# Patient Record
Sex: Female | Born: 1950
Health system: Southern US, Community
[De-identification: ages and names within clinical notes are randomized; demographics above are authoritative.]

## PROBLEM LIST (undated history)

## (undated) DIAGNOSIS — M1990 Unspecified osteoarthritis, unspecified site: Secondary | ICD-10-CM

## (undated) DIAGNOSIS — Z91199 Patient's noncompliance with other medical treatment and regimen due to unspecified reason: Secondary | ICD-10-CM

## (undated) DIAGNOSIS — I351 Nonrheumatic aortic (valve) insufficiency: Secondary | ICD-10-CM

## (undated) DIAGNOSIS — I4819 Other persistent atrial fibrillation: Secondary | ICD-10-CM

## (undated) DIAGNOSIS — I5022 Chronic systolic (congestive) heart failure: Secondary | ICD-10-CM

## (undated) DIAGNOSIS — T4145XA Adverse effect of unspecified anesthetic, initial encounter: Secondary | ICD-10-CM

## (undated) DIAGNOSIS — R569 Unspecified convulsions: Secondary | ICD-10-CM

## (undated) DIAGNOSIS — E119 Type 2 diabetes mellitus without complications: Secondary | ICD-10-CM

## (undated) DIAGNOSIS — I10 Essential (primary) hypertension: Secondary | ICD-10-CM

## (undated) DIAGNOSIS — Z9119 Patient's noncompliance with other medical treatment and regimen: Secondary | ICD-10-CM

## (undated) DIAGNOSIS — L719 Rosacea, unspecified: Secondary | ICD-10-CM

## (undated) DIAGNOSIS — T8859XA Other complications of anesthesia, initial encounter: Secondary | ICD-10-CM

## (undated) HISTORY — PX: FRACTURE SURGERY: SHX138

## (undated) HISTORY — PX: TONSILLECTOMY: SUR1361

---
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
## 1976-05-17 HISTORY — PX: WRIST HARDWARE REMOVAL: SHX2673

## 1976-05-17 HISTORY — PX: WRIST FRACTURE SURGERY: SHX121

## 2010-05-17 HISTORY — PX: PERICARDIOCENTESIS: SHX2215

## 2014-03-21 ENCOUNTER — Emergency Department (HOSPITAL_COMMUNITY): Payer: Medicaid - Out of State

## 2014-03-21 ENCOUNTER — Inpatient Hospital Stay (HOSPITAL_COMMUNITY)
Admission: EM | Admit: 2014-03-21 | Discharge: 2014-03-25 | DRG: 308 | Disposition: A | Discharge status: Home / Self Care | Payer: Medicaid - Out of State | Attending: Internal Medicine | Admitting: Internal Medicine

## 2014-03-21 ENCOUNTER — Encounter (HOSPITAL_COMMUNITY): Payer: Self-pay | Admitting: Emergency Medicine

## 2014-03-21 DIAGNOSIS — I509 Heart failure, unspecified: Secondary | ICD-10-CM

## 2014-03-21 DIAGNOSIS — R0602 Shortness of breath: Secondary | ICD-10-CM

## 2014-03-21 DIAGNOSIS — I480 Paroxysmal atrial fibrillation: Principal | ICD-10-CM | POA: Diagnosis present

## 2014-03-21 DIAGNOSIS — Z888 Allergy status to other drugs, medicaments and biological substances status: Secondary | ICD-10-CM | POA: Diagnosis not present

## 2014-03-21 DIAGNOSIS — Z833 Family history of diabetes mellitus: Secondary | ICD-10-CM | POA: Diagnosis not present

## 2014-03-21 DIAGNOSIS — R569 Unspecified convulsions: Secondary | ICD-10-CM | POA: Diagnosis present

## 2014-03-21 DIAGNOSIS — Z6839 Body mass index (BMI) 39.0-39.9, adult: Secondary | ICD-10-CM

## 2014-03-21 DIAGNOSIS — Z9119 Patient's noncompliance with other medical treatment and regimen: Secondary | ICD-10-CM | POA: Diagnosis present

## 2014-03-21 DIAGNOSIS — I471 Supraventricular tachycardia: Secondary | ICD-10-CM

## 2014-03-21 DIAGNOSIS — E669 Obesity, unspecified: Secondary | ICD-10-CM | POA: Diagnosis present

## 2014-03-21 DIAGNOSIS — Z880 Allergy status to penicillin: Secondary | ICD-10-CM | POA: Diagnosis not present

## 2014-03-21 DIAGNOSIS — I5023 Acute on chronic systolic (congestive) heart failure: Secondary | ICD-10-CM | POA: Diagnosis present

## 2014-03-21 DIAGNOSIS — I4892 Unspecified atrial flutter: Secondary | ICD-10-CM | POA: Diagnosis present

## 2014-03-21 DIAGNOSIS — R739 Hyperglycemia, unspecified: Secondary | ICD-10-CM

## 2014-03-21 DIAGNOSIS — E1165 Type 2 diabetes mellitus with hyperglycemia: Secondary | ICD-10-CM | POA: Diagnosis present

## 2014-03-21 DIAGNOSIS — Z91199 Patient's noncompliance with other medical treatment and regimen due to unspecified reason: Secondary | ICD-10-CM

## 2014-03-21 HISTORY — DX: Unspecified convulsions: R56.9

## 2014-03-21 LAB — BASIC METABOLIC PANEL
Anion gap: 14 (ref 5–15)
BUN: 15 mg/dL (ref 6–23)
CO2: 22 mEq/L (ref 19–32)
Calcium: 8.8 mg/dL (ref 8.4–10.5)
Chloride: 102 mEq/L (ref 96–112)
Creatinine, Ser: 1.01 mg/dL (ref 0.50–1.10)
GFR calc Af Amer: 67 mL/min — ABNORMAL LOW (ref >90)
GFR calc non Af Amer: 58 mL/min — ABNORMAL LOW (ref >90)
Glucose, Bld: 362 mg/dL — ABNORMAL HIGH (ref 70–99)
Potassium: 5 mEq/L (ref 3.7–5.3)
Sodium: 138 mEq/L (ref 137–147)

## 2014-03-21 LAB — CBC
HCT: 39.6 % (ref 36.0–46.0)
Hemoglobin: 12.9 g/dL (ref 12.0–15.0)
MCH: 33.4 pg (ref 26.0–34.0)
MCHC: 32.6 g/dL (ref 30.0–36.0)
MCV: 102.6 fL — ABNORMAL HIGH (ref 78.0–100.0)
Platelets: 231 10*3/uL (ref 150–400)
RBC: 3.86 MIL/uL — ABNORMAL LOW (ref 3.87–5.11)
RDW: 16.2 % — ABNORMAL HIGH (ref 11.5–15.5)
WBC: 11.7 10*3/uL — ABNORMAL HIGH (ref 4.0–10.5)

## 2014-03-21 LAB — PRO B NATRIURETIC PEPTIDE: Pro B Natriuretic peptide (BNP): 2946 pg/mL — ABNORMAL HIGH (ref 0–125)

## 2014-03-21 LAB — DIGOXIN LEVEL: Digoxin Level: <0.3 ng/mL — ABNORMAL LOW (ref 0.8–2.0)

## 2014-03-21 LAB — PROTIME-INR
INR: 1.12 (ref 0.00–1.49)
Prothrombin Time: 14.5 seconds (ref 11.6–15.2)

## 2014-03-21 LAB — I-STAT TROPONIN, ED: Troponin i, poc: 0 ng/mL (ref 0.00–0.08)

## 2014-03-21 MED ORDER — METOPROLOL TARTRATE 1 MG/ML IV SOLN
5.0000 mg | Freq: Once | INTRAVENOUS | Status: AC
  Administered 2014-03-21: 5 mg via INTRAVENOUS
  Filled 2014-03-21: qty 5

## 2014-03-21 MED ORDER — DILTIAZEM HCL 100 MG IV SOLR
5.0000 mg/h | Freq: Once | INTRAVENOUS | Status: AC
  Administered 2014-03-21: 5 mg/h via INTRAVENOUS
  Administered 2014-03-22: 15 mg/h via INTRAVENOUS

## 2014-03-21 MED ORDER — FUROSEMIDE 10 MG/ML IJ SOLN
40.0000 mg | INTRAMUSCULAR | Status: AC
  Administered 2014-03-21: 40 mg via INTRAVENOUS
  Filled 2014-03-21: qty 4

## 2014-03-21 MED ORDER — SODIUM CHLORIDE 0.9 % IV BOLUS (SEPSIS): 250.0000 mL | Freq: Once | INTRAVENOUS | Status: DC

## 2014-03-21 NOTE — ED Notes (Signed)
Cardiologist at [facility]

## 2014-03-21 NOTE — ED Notes (Signed)
Pt brought to ED by [facility] from home c/o chest pressure, SOB, weakness. Pt has Hx of Afib, CHF. Pt was very SOB and diaphoretic on [facility] arrival, HR irregular on the 180's, Cardizem 10 mg IV bolus given x 2, HR on 127's after Cardizem given by [facility]. Pt still SOB, NAD noticed.

## 2014-03-21 NOTE — ED Provider Notes (Signed)
  Face-to-face evaluation   History: she presents for evaluation of rapid heartbeat which started today.  She had associated shortness of breath and weakness but no chest pain.  Physical exam:calm, cooperative.  Heart rate is 119, at 2305.  No respiratory distress.  Medical screening examination/treatment/procedure(s) were conducted as a shared visit with non-physician practitioner(s) and myself.  I personally evaluated the patient during the encounter  Flint Melter, MD 03/22/14 2342

## 2014-03-21 NOTE — ED Notes (Signed)
Pt very upset about getting Lasix, she state she will refuse any other Lasix for tonight.

## 2014-03-21 NOTE — ED Provider Notes (Signed)
CSN: [id number]     Arrival date & time 03/21/14  2101 History   First MD Initiated Contact with Patient 03/21/14 2155     Chief Complaint  Patient presents with  . Shortness of Breath  . Weakness    (Consider location/radiation/quality/duration/timing/severity/associated sxs/prior Treatment) HPI Comments: 63 year old female with a history of CHF, A. Fib, diabetes mellitus, and seizures presents to the ED for further evaluation of SOB and weakness. Patient states that symptoms began this morning patient states that symptoms began this morning as severe shortness of breath which was worse with exertion. She states that she could barely walk to the bathroom as a result of symptoms. Patient endorses associated palpitations, stating "it felt like my heart was beating so fast". Patient states that she moved to the area one week ago. She has been out of all of her medications except Lasix 40 mg which she has been taking daily. Patient states that she usually also takes carvedilol, glyburide, digoxin, and enalapril. She states that she used to be on Prozac so, but has been off of blood thinners for one year. Patient denies associated chest pain, loss of consciousness, lightheadedness or dizziness, headache, nausea, vomiting, and leg swelling. She has not established care with any provider in the area. On EMS arrival, patient was found to have a heart rate which was irregular and in the 180s. Patient was given 10 mg IV Cardizem bolus 2 which improved heart rate to 127.  Patient is a 63 y.o. female presenting with shortness of breath and weakness. The history is provided by the patient. No language interpreter was used.  Shortness of Breath Weakness Associated symptoms include weakness.    Past Medical History  Diagnosis Date  . CHF (congestive heart failure)   . Atrial fibrillation   . Diabetes mellitus without complication     no insuline dependent  . Seizures    Past Surgical History  Procedure  Laterality Date  . Orthopedic surgery     History reviewed. No pertinent family history. History  Substance Use Topics  . Smoking status: Never Smoker   . Smokeless tobacco: Never Used  . Alcohol Use: No   OB History    No data available      Review of Systems  Respiratory: Positive for shortness of breath.   Cardiovascular: Positive for palpitations.  Neurological: Positive for weakness.  All other systems reviewed and are negative.   Allergies  Caffeine and Penicillins  Home Medications   Prior to Admission medications   Medication Sig Start Date End Date Taking? Authorizing Provider  furosemide (LASIX) 40 MG tablet Take 40 mg by mouth daily.   Yes Historical Provider, MD   BP 112/84 mmHg  Pulse 118  Temp(Src) 97.9 F (36.6 C) (Oral)  Resp 23  Ht 5\' 11"  (1.803 m)  Wt 285 lb (129.275 kg)  BMI 39.77 kg/m2  SpO2 93%   Physical Exam  Constitutional: She is oriented to person, place, and time. She appears well-developed and well-nourished. No distress.  HENT:  Head: Normocephalic and atraumatic.  Eyes: Conjunctivae and EOM are normal. No scleral icterus.  Neck: Normal range of motion.  Cardiovascular: Regular rhythm and normal pulses.  Tachycardia present.   Pulses:      Radial pulses are 2+ on the right side, and 2+ on the left side.  Pulmonary/Chest: Effort normal and breath sounds normal. No respiratory distress. She has no wheezes. She has no rales.  Mild decreased BS in b/l bases. Lungs  otherwise CTA. No tachypnea; mild dyspnea.  Abdominal: Soft. There is no tenderness.  Soft, obese abdomen  Musculoskeletal: Normal range of motion. She exhibits edema.  2+ pitting edema in b/l lower extremities.  Neurological: She is alert and oriented to person, place, and time. She exhibits normal muscle tone. Coordination normal.  Skin: Skin is warm and dry. No rash noted. She is not diaphoretic. No erythema. No pallor.  Psychiatric: She has a normal mood and affect. Her  behavior is normal.  Nursing note and vitals reviewed.   ED Course  Procedures (including critical care time) Labs Review Labs Reviewed  CBC - Abnormal; Notable for the following:    WBC 11.7 (*)    RBC 3.86 (*)    MCV 102.6 (*)    RDW 16.2 (*)    All other components within normal limits  BASIC METABOLIC PANEL - Abnormal; Notable for the following:    Glucose, Bld 362 (*)    GFR calc non Af Amer 58 (*)    GFR calc Af Amer 67 (*)    All other components within normal limits  PRO B NATRIURETIC PEPTIDE - Abnormal; Notable for the following:    Pro B Natriuretic peptide (BNP) 2946.0 (*)    All other components within normal limits  DIGOXIN LEVEL - Abnormal; Notable for the following:    Digoxin Level <0.3 (*)    All other components within normal limits  PROTIME-INR  Rosezena SensorI-STAT TROPOININ, ED    Imaging Review Dg Chest 2 View  03/21/2014   CLINICAL DATA:  Shortness of breath. History of atrial fibrillation.  EXAM: CHEST  2 VIEW  COMPARISON:  None.  FINDINGS: Cardiac enlargement with mild pulmonary vascular congestion. Interstitial changes in the lungs likely due to edema. Pre-existing interstitial fibrosis could also have this appearance. Blunting of the costophrenic angles suggesting small bilateral pleural effusions. No focal consolidation in the lungs. No pneumothorax. Tortuous aorta.  IMPRESSION: Cardiac enlargement with pulmonary vascular congestion and probable interstitial edema. Small effusions bilaterally.   Electronically Signed   By: Burman NievesWilliam  Stevens M.D.   On: 03/21/2014 22:10     EKG Interpretation   Date/Time:  Thursday March 21 2014 21:14:02 EST Ventricular Rate:  128 PR Interval:  118 QRS Duration: 83 QT Interval:  300 QTC Calculation: 438 R Axis:   21 Text Interpretation:  Ectopic atrial tachycardia, unifocal Anteroseptal  infarct, old Repol abnrm suggests ischemia, diffuse leads Baseline wander  in lead(s) III aVF No old tracing to compare Confirmed by [facility]   MD,  ELLIOTT [phone]([id number]) on 03/21/2014 9:50:49 PM       CRITICAL CARE Performed by: Antony MaduraHUMES, Alexy Heldt   Total critical care time: 45  Critical care time was exclusive of separately billable procedures and treating other patients.  Critical care was necessary to treat or prevent imminent or life-threatening deterioration.  Critical care was time spent personally by me on the following activities: development of treatment plan with patient and/or surrogate as well as nursing, discussions with consultants, evaluation of patient's response to treatment, examination of patient, obtaining history from patient or surrogate, ordering and performing treatments and interventions, ordering and review of laboratory studies, ordering and review of radiographic studies, pulse oximetry and re-evaluation of patient's condition.  MDM   Final diagnoses:  SOB (shortness of breath)  Atrial tachycardia  Acute congestive heart failure, unspecified congestive heart failure type  Hyperglycemia    63 year old female who recently relocated to the area with a history of a fib,  diabetes mellitus, and CHF presents to the emergency department for shortness of breath and weakness. EKG shows ectopic atrial tachycardia on arrival. Workup today also reveals degree of heart failure with elevated BNP and signs of vascular congestion on chest x-ray. Patient initially had 2 rounds of 10 mg Cardizem boluses by EMS PTA. This improved heart rate from 180-128. Patient was initially given 5 mg Lopressor for treatment of potential underlying atrial flutter with no conversion in rate. Cardizem drip initiated. I consulted with Dr. Carolanne Grumbling of cardiology who recommends admission to the medicine service given underlying uncontrolled diabetes with elevated blood sugar of 362. No evidence of DKA. Case discussed with Dr. Toniann Fail of [facility] who will admit. Cards to consult.   Filed Vitals:   03/21/14 2230 03/21/14 2245 03/21/14 2259 03/21/14  2300  BP: 115/81 103/71  112/84  Pulse: 127 125 119 118  Temp:      TempSrc:      Resp: 23     Height:      Weight:      SpO2: 91% 96%  93%     Antony Madura, PA-C 03/21/14 2326  Flint Melter, MD 03/22/14 2342

## 2014-03-21 NOTE — ED Notes (Signed)
Patient transported to X-ray 

## 2014-03-22 ENCOUNTER — Encounter (HOSPITAL_COMMUNITY): Payer: Self-pay | Admitting: Internal Medicine

## 2014-03-22 DIAGNOSIS — I509 Heart failure, unspecified: Secondary | ICD-10-CM

## 2014-03-22 LAB — COMPREHENSIVE METABOLIC PANEL
ALT: 15 U/L (ref 0–35)
AST: 17 U/L (ref 0–37)
Albumin: 3.4 g/dL — ABNORMAL LOW (ref 3.5–5.2)
Alkaline Phosphatase: 47 U/L (ref 39–117)
Anion gap: 15 (ref 5–15)
BUN: 15 mg/dL (ref 6–23)
CO2: 23 mEq/L (ref 19–32)
Calcium: 9.1 mg/dL (ref 8.4–10.5)
Chloride: 98 mEq/L (ref 96–112)
Creatinine, Ser: 0.92 mg/dL (ref 0.50–1.10)
GFR calc Af Amer: 75 mL/min — ABNORMAL LOW (ref >90)
GFR calc non Af Amer: 65 mL/min — ABNORMAL LOW (ref >90)
Glucose, Bld: 296 mg/dL — ABNORMAL HIGH (ref 70–99)
Potassium: 3.7 mEq/L (ref 3.7–5.3)
Sodium: 136 mEq/L — ABNORMAL LOW (ref 137–147)
Total Bilirubin: 0.7 mg/dL (ref 0.3–1.2)
Total Protein: 6.9 g/dL (ref 6.0–8.3)

## 2014-03-22 LAB — CBC
HCT: 39.8 % (ref 36.0–46.0)
Hemoglobin: 12.8 g/dL (ref 12.0–15.0)
MCH: 33.2 pg (ref 26.0–34.0)
MCHC: 32.2 g/dL (ref 30.0–36.0)
MCV: 103.1 fL — ABNORMAL HIGH (ref 78.0–100.0)
Platelets: 218 10*3/uL (ref 150–400)
RBC: 3.86 MIL/uL — ABNORMAL LOW (ref 3.87–5.11)
RDW: 16.2 % — ABNORMAL HIGH (ref 11.5–15.5)
WBC: 9.5 10*3/uL (ref 4.0–10.5)

## 2014-03-22 LAB — TSH: TSH: 5.34 u[IU]/mL — ABNORMAL HIGH (ref 0.350–4.500)

## 2014-03-22 LAB — T4, FREE: Free T4: 1.05 ng/dL (ref 0.80–1.80)

## 2014-03-22 LAB — URINALYSIS, ROUTINE W REFLEX MICROSCOPIC
Bilirubin Urine: NEGATIVE
Glucose, UA: 250 mg/dL — AB
Hgb urine dipstick: NEGATIVE
Ketones, ur: NEGATIVE mg/dL
Leukocytes, UA: NEGATIVE
Nitrite: NEGATIVE
Protein, ur: NEGATIVE mg/dL
Specific Gravity, Urine: 1.007 (ref 1.005–1.030)
Urobilinogen, UA: 0.2 mg/dL (ref 0.0–1.0)
pH: 7 (ref 5.0–8.0)

## 2014-03-22 LAB — HEPARIN LEVEL (UNFRACTIONATED)
Heparin Unfractionated: 0.18 IU/mL — ABNORMAL LOW (ref 0.30–0.70)
Heparin Unfractionated: 0.14 IU/mL — ABNORMAL LOW (ref 0.30–0.70)
Heparin Unfractionated: 0.23 IU/mL — ABNORMAL LOW (ref 0.30–0.70)

## 2014-03-22 LAB — TROPONIN I
Troponin I: <0.3 ng/mL (ref <0.30)
Troponin I: <0.3 ng/mL (ref <0.30)
Troponin I: <0.3 ng/mL (ref <0.30)

## 2014-03-22 LAB — MRSA PCR SCREENING: MRSA by PCR: NEGATIVE

## 2014-03-22 LAB — HEMOGLOBIN A1C
Hgb A1c MFr Bld: 8.6 % — ABNORMAL HIGH (ref <5.7)
Mean Plasma Glucose: 200 mg/dL — ABNORMAL HIGH (ref <117)

## 2014-03-22 LAB — T3, FREE: T3, Free: 3 pg/mL (ref 2.3–4.2)

## 2014-03-22 LAB — PRO B NATRIURETIC PEPTIDE: Pro B Natriuretic peptide (BNP): 3181 pg/mL — ABNORMAL HIGH (ref 0–125)

## 2014-03-22 LAB — CBG MONITORING, ED: Glucose-Capillary: 241 mg/dL — ABNORMAL HIGH (ref 70–99)

## 2014-03-22 LAB — D-DIMER, QUANTITATIVE: D-Dimer, Quant: 1.38 ug/mL-FEU — ABNORMAL HIGH (ref 0.00–0.48)

## 2014-03-22 LAB — GLUCOSE, CAPILLARY: Glucose-Capillary: 282 mg/dL — ABNORMAL HIGH (ref 70–99)

## 2014-03-22 MED ORDER — ACETAMINOPHEN 325 MG PO TABS: 650.0000 mg | ORAL_TABLET | Freq: Four times a day (QID) | ORAL | Status: DC | PRN

## 2014-03-22 MED ORDER — SODIUM CHLORIDE 0.9 % IJ SOLN
3.0000 mL | Freq: Two times a day (BID) | INTRAMUSCULAR | Status: DC
  Administered 2014-03-22 – 2014-03-25 (×7): 3 mL via INTRAVENOUS

## 2014-03-22 MED ORDER — ALPRAZOLAM 0.25 MG PO TABS
0.2500 mg | ORAL_TABLET | Freq: Once | ORAL | Status: AC
  Administered 2014-03-22: 0.25 mg via ORAL
  Filled 2014-03-22: qty 1

## 2014-03-22 MED ORDER — HEPARIN BOLUS VIA INFUSION
2000.0000 [IU] | Freq: Once | INTRAVENOUS | Status: AC
  Administered 2014-03-22: 2000 [IU] via INTRAVENOUS
  Filled 2014-03-22: qty 2000

## 2014-03-22 MED ORDER — DILTIAZEM HCL 100 MG IV SOLR
5.0000 mg/h | Freq: Once | INTRAVENOUS | Status: AC
  Administered 2014-03-22: 15 mg/h via INTRAVENOUS
  Filled 2014-03-22: qty 100

## 2014-03-22 MED ORDER — FUROSEMIDE 10 MG/ML IJ SOLN
40.0000 mg | Freq: Two times a day (BID) | INTRAMUSCULAR | Status: DC
  Administered 2014-03-23 – 2014-03-24 (×4): 40 mg via INTRAVENOUS
  Filled 2014-03-22 (×8): qty 4

## 2014-03-22 MED ORDER — INSULIN ASPART 100 UNIT/ML ~~LOC~~ SOLN
0.0000 [IU] | Freq: Three times a day (TID) | SUBCUTANEOUS | Status: DC
  Administered 2014-03-22: 3 [IU] via SUBCUTANEOUS
  Administered 2014-03-22: 5 [IU] via SUBCUTANEOUS
  Administered 2014-03-23 (×3): 2 [IU] via SUBCUTANEOUS
  Administered 2014-03-24: 5 [IU] via SUBCUTANEOUS
  Administered 2014-03-24: 1 [IU] via SUBCUTANEOUS
  Filled 2014-03-22: qty 1

## 2014-03-22 MED ORDER — ONDANSETRON HCL 4 MG/2ML IJ SOLN: 4.0000 mg | Freq: Four times a day (QID) | INTRAMUSCULAR | Status: DC | PRN

## 2014-03-22 MED ORDER — ONDANSETRON HCL 4 MG PO TABS: 4.0000 mg | ORAL_TABLET | Freq: Four times a day (QID) | ORAL | Status: DC | PRN

## 2014-03-22 MED ORDER — METOPROLOL TARTRATE 25 MG PO TABS
25.0000 mg | ORAL_TABLET | Freq: Two times a day (BID) | ORAL | Status: DC
  Administered 2014-03-22 – 2014-03-23 (×5): 25 mg via ORAL
  Filled 2014-03-22 (×7): qty 1

## 2014-03-22 MED ORDER — ACETAMINOPHEN 650 MG RE SUPP: 650.0000 mg | Freq: Four times a day (QID) | RECTAL | Status: DC | PRN

## 2014-03-22 MED ORDER — DIGOXIN 250 MCG PO TABS
0.2500 mg | ORAL_TABLET | Freq: Every day | ORAL | Status: DC
  Administered 2014-03-22 – 2014-03-24 (×3): 0.25 mg via ORAL
  Filled 2014-03-22 (×2): qty 1
  Filled 2014-03-22: qty 2
  Filled 2014-03-22: qty 1

## 2014-03-22 MED ORDER — HEPARIN BOLUS VIA INFUSION
4000.0000 [IU] | Freq: Once | INTRAVENOUS | Status: AC
  Administered 2014-03-22: 4000 [IU] via INTRAVENOUS
  Filled 2014-03-22: qty 4000

## 2014-03-22 MED ORDER — GLYBURIDE 5 MG PO TABS
5.0000 mg | ORAL_TABLET | Freq: Every day | ORAL | Status: DC
  Administered 2014-03-22 – 2014-03-25 (×4): 5 mg via ORAL
  Filled 2014-03-22 (×5): qty 1

## 2014-03-22 MED ORDER — METOPROLOL TARTRATE 1 MG/ML IV SOLN
2.5000 mg | INTRAVENOUS | Status: DC | PRN
  Administered 2014-03-24: 2.5 mg via INTRAVENOUS
  Filled 2014-03-22: qty 5

## 2014-03-22 MED ORDER — HEPARIN (PORCINE) IN NACL 100-0.45 UNIT/ML-% IJ SOLN
2200.0000 [IU]/h | INTRAMUSCULAR | Status: AC
  Administered 2014-03-22: 1600 [IU]/h via INTRAVENOUS
  Administered 2014-03-22: 1800 [IU]/h via INTRAVENOUS
  Administered 2014-03-23: 2400 [IU]/h via INTRAVENOUS
  Administered 2014-03-24: 2300 [IU]/h via INTRAVENOUS
  Filled 2014-03-22 (×8): qty 250

## 2014-03-22 NOTE — H&P (Signed)
[facility] History and Physical  Kaitlyn [name] NHA:[id number] DOB: 10/07/1950 DOA: 03/21/2014  Referring physician: ER physician. PCP: No PCP Per Patient   Chief Complaint: Shortness of breath.  HPI: Kaitlyn [name] is a 63 y.o. female with history of diabetes mellitus type 2 and atrial fibrillation and previous history of seizures present on no medications presented to the ER because of shortness of breath. Patient said that she suddenly started getting short of breath with palpitations last afternoon. Patient has just recently moved from [place] last week and has ran out of her medications and was only taking her Lasix. Patient states he usually is on digoxin, carvedilol and Lasix along with glyburide. In the ER patient was found to be in atrial flutter with RVR and chest x-ray was showing congestion. Patient was initially given Cardizem by EMS following which patient's heart rate improved from 180 to 120s and in the ER patient was started on Cardizem infusion after initial metoprolol IV was given. On call cardiologist Dr. Mayford Knife has been consulted and patient has been admitted for further management of her CHF and atrial flutter with RVR. Patient states that she has not been taking any anticoagulants now but she did take once in 2012. Patient denies any fever chills chest pain nausea vomiting abdominal pain headache focal deficits.   Review of Systems: As presented in the history of presenting illness, rest negative.  Past Medical History  Diagnosis Date  . CHF (congestive heart failure)   . Atrial fibrillation   . Diabetes mellitus without complication     no insuline dependent  . Seizures    Past Surgical History  Procedure Laterality Date  . Orthopedic surgery     Social History:  reports that she has never smoked. She has never used smokeless tobacco. She reports that she does not drink alcohol or use illicit drugs. Where does patient live home. Can patient participate in  ADLs? Yes.  Allergies  Allergen Reactions  . Caffeine Other (See Comments)    Seizure  . Penicillins Other (See Comments)    Childhood allery    Family History:  Family History  Problem Relation Age of Onset  . Diabetes Mellitus II Mother       Prior to Admission medications   Medication Sig Start Date End Date Taking? Authorizing Provider  furosemide (LASIX) 40 MG tablet Take 40 mg by mouth daily.   Yes Historical Provider, MD    Physical Exam: Filed Vitals:   03/21/14 2300 03/21/14 2330 03/22/14 0000 03/22/14 0044  BP: 112/84 120/64 117/69 127/76  Pulse: 118 122 124 125  Temp:      TempSrc:      Resp:    17  Height:      Weight:      SpO2: 93% 96% 95% 97%     General:  Well-developed and nourished.  Eyes: anicteric no pallor.  ENT: no discharge from the ears eyes nose mouth.  Neck: no mass felt. JVD elevated.  Cardiovascular: S1-S2 heard tachycardic irregular.  Respiratory: no rhonchi or crepitations.  Abdomen: soft nontender bowel sounds present. No guarding or rigidity.  Skin: no rash.  Musculoskeletal: mild edema.  Psychiatric: appears normal.  Neurologic: alert awake oriented to time place and person. Moves all extremities 5 x 5.  Labs on Admission:  Basic Metabolic Panel:  Recent Labs Lab 03/21/14 2126  NA 138  K 5.0  CL 102  CO2 22  GLUCOSE 362*  BUN 15  CREATININE 1.01  CALCIUM 8.8   Liver Function Tests: No results for input(s): AST, ALT, ALKPHOS, BILITOT, PROT, ALBUMIN in the last 168 hours. No results for input(s): LIPASE, AMYLASE in the last 168 hours. No results for input(s): AMMONIA in the last 168 hours. CBC:  Recent Labs Lab 03/21/14 2126  WBC 11.7*  HGB 12.9  HCT 39.6  MCV 102.6*  PLT 231   Cardiac Enzymes: No results for input(s): CKTOTAL, CKMB, CKMBINDEX, TROPONINI in the last 168 hours.  BNP (last 3 results)  Recent Labs  03/21/14 2126  PROBNP 2946.0*   CBG: No results for input(s): GLUCAP in the  last 168 hours.  Radiological Exams on Admission: Dg Chest 2 View  03/21/2014   CLINICAL DATA:  Shortness of breath. History of atrial fibrillation.  EXAM: CHEST  2 VIEW  COMPARISON:  None.  FINDINGS: Cardiac enlargement with mild pulmonary vascular congestion. Interstitial changes in the lungs likely due to edema. Pre-existing interstitial fibrosis could also have this appearance. Blunting of the costophrenic angles suggesting small bilateral pleural effusions. No focal consolidation in the lungs. No pneumothorax. Tortuous aorta.  IMPRESSION: Cardiac enlargement with pulmonary vascular congestion and probable interstitial edema. Small effusions bilaterally.   Electronically Signed   By: Burman NievesWilliam  Stevens M.D.   On: 03/21/2014 22:10    EKG: Independently reviewed. Tachycardia with probably A. Fib with RVR.  Assessment/Plan Principal Problem:   CHF exacerbation Active Problems:   Atrial tachycardia   Atrial flutter with rapid ventricular response   Diabetes mellitus type 2, uncontrolled   CHF (congestive heart failure)   1. Decompensated CHF - EF unknown. To date has been ordered and patient has been placed on Lasix 40 mg IV every 12 hourly. After discussing with cardiologist Dr. Mayford Knifeurner patient has been continued on digoxin and placed on Lopressor 25 mg by mouth twice a day. Patient presently is also on Cardizem infusion. Closely follow intake output daily weights and metabolic panel. Patient on heparin infusion which need to be changed to oral anticoagulants. 2. Atrial fibrillation/flutter with RVR - patient is started on Lopressor and is on Cardizem infusion at this time. Patient also has been placed on digoxin. Closely follow heart rate. On heparin for now. Change to oral anticoagulants. 3. Diabetes mellitus type 2 uncontrolled not in DKA - patient has not been taking her medications for last 1 week as she ran out. I have restarted her glyburide 5 mg by mouth daily. Closely follow CBGs with  sliding scale coverage. 4. History of seizures off medications for last 30 years.    Code Status: full code.  Family Communication: none.  Disposition Plan:admit to inpatient.   Kaitlyn [name] N. [facility] Pager [phone].  If 7PM-7AM, please contact night-coverage [email or web address] Password [email or web address] 03/22/2014, 12:50 AM

## 2014-03-22 NOTE — Discharge Instructions (Signed)
[facility] # [phone] or [phone] (option #2) these numbers will assist you in finding a primary care doctor in community

## 2014-03-22 NOTE — Plan of Care (Signed)
Problem: Consults Goal: Skin Care Protocol Initiated - if Braden Score 18 or less If consults are not indicated, leave blank or document N/A Outcome: Completed/Met Date Met:  03/22/14 Goal: Tobacco Cessation referral if indicated Outcome: Not Applicable Date Met:  03/22/14  Problem: Phase I Progression Outcomes Goal: Up in chair, BRP Outcome: Completed/Met Date Met:  03/22/14 Goal: Voiding-avoid urinary catheter unless indicated Outcome: Completed/Met Date Met:  03/22/14     

## 2014-03-22 NOTE — Progress Notes (Signed)
UR note completed. 

## 2014-03-22 NOTE — ED Notes (Signed)
This RN verified heparin with Jesse Sans, RN

## 2014-03-22 NOTE — Care Management Note (Signed)
    Page 1 of 2   03/25/2014     3:14:41 PM CARE MANAGEMENT NOTE 03/25/2014  Patient:  Kaitlyn [name], Kaitlyn [name]   Account Number:  [id number]  Date Initiated:  03/22/2014  Documentation initiated by:  Kaitlyn [name],Kaitlyn [name]  Subjective/Objective Assessment:   Admit with afib/rvr and sob     Action/Plan:   Pt recently moved here from [place] has not taken her medications second ran out. No PCP here   Anticipated DC Date:  03/24/2014   Anticipated DC Plan:  HOME/SELF CARE  In-house referral  Clinical Social Worker      DC Planning Services  CM consult  [place] [facility]  Medication Assistance  Follow-up appt scheduled  [facility]      Choice offered to / List presented to:             Status of service:  Completed, signed off Medicare Important Message given?  NO (If response is "NO", the following Medicare IM given date fields will be blank) Date Medicare IM given:   Medicare IM given by:   Date Additional Medicare IM given:   Additional Medicare IM given by:    Discharge Disposition:  HOME/SELF CARE  Per UR Regulation:  Reviewed for med. necessity/level of care/duration of stay  If discussed at [place] of Stay Meetings, dates discussed:    Comments:  03/25/14- 1130- Kaitlyn Gibbons RN, BSN [phone] In speaking with pt further today, pt has Managed Medicaid through [facility] in [place]- in working with [place] she will not be able to have her prescriptions filled under this plan and will need assistance with her medications- [place] letter faxed to [place] and explained to pt MATCH program- also activated pt's 30 day free Eliquis card with pt in room.  Pt has been set up to f/u with the heart failure clinic and CM has also placed a call to the [facility] for PCP f/u- awaiting return call from clinic with f/u appointment- will contact pt with appointment if already discharged. [phone]) Have discussed with pt need to apply for [facility] Medicaid as soon as she can and to call the rep.  with the [facility] to see what if any coverage for services that she might have here in [place]. Per pt she states that she will f/u in doing this - she reports that she will have transportation to appointments, but will need assistance with transportation home today, CSW to see pt regarding transportation needs. update- contact made with [facility]- f/u appointment made for 03/27/14 9:00 am- pt informed prior to d/c and info written on d/c instructions-   03/22/14 Kaitlyn Douglas RN Met with pt to discuss PCP follow up after dc. Pt with [employer] and given health connect number and instructed on how to proceed in finding PCP. Discussed with pt that benefit check has been placed to find out cost/copay of Eliquis. CM will inform pt of benefit coverage once it is obtained, will give pt free 30 day card at time of dc. Continue to follow case. Update: benefit check received, pt with $3 for Brand, $1 for generic and .50 for OTC. Pt informed of benefit.

## 2014-03-22 NOTE — ED Notes (Signed)
HR still on 125 with the Cardizem gtt and Lopressor given, Dr. Rueben Bash no new order gotten, will continue to monitor.

## 2014-03-22 NOTE — Consult Note (Signed)
Admit date: 03/21/2014 Referring Physician  Dr. Effie Shy  Primary Physician  None Primary Cardiologist  None Reason for Consultation  Atrial flutter with RVR and SOB  HPI: This is a 62yo obese WF with a history of PAF, CHF, DM and seizure disorder who had been followed by a Cardiologist in [place] in the past.  She was on Pradaxa at one point but this was stopped, she says, because her MD changed her to Coreg.  She has had some financial problems and lost her residence in [place] and moved here a few days ago.  She has been without her Coreg, ACE I, glyberide and dig for 1 month because she could not afford them. She has continued to take her Lasix.   She says that this am she developed severe SOB, worse with exertion but no chest pain.  She says that she could barely walk across the room.  She had palpitations as well.  She denies any fever, chills, LE edema, nausea, diarrhea, dizziness or dysuria.  On arrival of EMS, she was found to have a HR of 180bpm and was given 10mg  IV Cardizem bolus x 2 which took her HR down to 127bpm.  She was then given Lopressor 5mg  IV. EKG showed atrial flutter with RVR.  Chest xray showed vascular congestion and BNP elevated at 2946.      PMH:   Past Medical History  Diagnosis Date  . CHF (congestive heart failure)   . Atrial fibrillation   . Diabetes mellitus without complication     no insuline dependent  . Seizures      PSH:   Past Surgical History  Procedure Laterality Date  . Orthopedic surgery      Allergies:  Caffeine and Penicillins Prior to Admit Meds:   (Not in a hospital admission) Fam HX:   History reviewed. No pertinent family history. Social HX:    History   Social History  . Marital Status: Single    Spouse Name: N/A    Number of Children: N/A  . Years of Education: N/A   Occupational History  . Not on file.   Social History Main Topics  . Smoking status: Never Smoker   . Smokeless tobacco: Never Used  . Alcohol Use: No  . Drug Use: No   . Sexual Activity: Not on file   Other Topics Concern  . Not on file   Social History Narrative  . No narrative on file     ROS:  All 11 ROS were addressed and are negative except what is stated in the HPI  Physical Exam: Blood pressure 112/84, pulse 118, temperature 97.9 F (36.6 C), temperature source Oral, resp. rate 23, height 5\' 11"  (1.803 m), weight 285 lb (129.275 kg), SpO2 93 %.    General: Well developed, well nourished, in no acute distress Head: Eyes PERRLA, No xanthomas.   Normal cephalic and atramatic  Lungs:   Crackles at bases. Heart:   HRRR tachycardic S1 S2 Pulses are 2+ & equal.            No carotid bruit. No JVD.  No abdominal bruits. No femoral bruits. Abdomen: Bowel sounds are positive, abdomen soft and non-tender without masses Extremities:   No clubbing, cyanosis or edema.  DP +1 Neuro: Alert and oriented X 3. Psych:  Good affect, responds appropriately    Labs:   Lab Results  Component Value Date   WBC 11.7* 03/21/2014   HGB 12.9 03/21/2014  HCT 39.6 03/21/2014   MCV 102.6* 03/21/2014   PLT 231 03/21/2014    Recent Labs Lab 03/21/14 2126  NA 138  K 5.0  CL 102  CO2 22  BUN 15  CREATININE 1.01  CALCIUM 8.8  GLUCOSE 362*   No results found for: PTT Lab Results  Component Value Date   INR 1.12 03/21/2014   No results found for: CKTOTAL, CKMB, CKMBINDEX, TROPONINI  No results found for: CHOL No results found for: HDL No results found for: LDLCALC No results found for: TRIG No results found for: CHOLHDL No results found for: LDLDIRECT    Radiology:  Dg Chest 2 View  03/21/2014   CLINICAL DATA:  Shortness of breath. History of atrial fibrillation.  EXAM: CHEST  2 VIEW  COMPARISON:  None.  FINDINGS: Cardiac enlargement with mild pulmonary vascular congestion. Interstitial changes in the lungs likely due to edema. Pre-existing interstitial fibrosis could also have this appearance. Blunting of the costophrenic angles suggesting small  bilateral pleural effusions. No focal consolidation in the lungs. No pneumothorax. Tortuous aorta.  IMPRESSION: Cardiac enlargement with pulmonary vascular congestion and probable interstitial edema. Small effusions bilaterally.   Electronically Signed   By: Burman NievesWilliam  Stevens M.D.   On: 03/21/2014 22:10    EKG:  Atrial flutter with RVR  ASSESSMENT:  1.  New onset atrial flutter of unknown duration with RVR.  Now on Cardizem gtt. 2.  Acute CHF - most likely diastolic from #1.  BNP elevated and chest xray shows vascular congestion 3.  Obesity 4.  Elevated WBC ? UTI with foul smelling urine 5.  Type II DM with poorly controlled BS due to running out of meds a month ago 6.  Seizure d/o  PLAN:   1.  Lasix IV given in ER.  Will start 40mg  IV BID 2.  Check BMET in am 3.  Check TSH 4.  Check DDimer given recent travel 5.  Hospitalist to manage noncardiac issues 6.  2D echo in AM to assess LA size and LVF 7.  Continue IV Cardizem gtt and titrated to keep HR <105bpm 8.  Start IV Heparin for now - will get case manager involved in am to try to determine what NOAC is best from cost standpoint since she is on medicaid and cannot afford meds.  Warfarin would be the cheapest but I am concerned about compliance. 9.  Start Lopressor 25mg  BID 10.  Restart digoxin  Quintella ReichertURNER,Narada Uzzle R, MD  03/22/2014  12:05 AM

## 2014-03-22 NOTE — Progress Notes (Signed)
PATIENT DETAILS Name: Kaitlyn [name] Age: 63 y.o. Sex: female Date of Birth: [birth year] Admit Date: 03/21/2014 Admitting Physician Eduard ClosArshad N Kakrakandy, MD PCP:No PCP Per Patient  Subjective: Feels much better  Assessment/Plan: Principal Problem:   CHF exacerbation: not known whether systolic/diastolic at this time. Improved, continue IV lasix. Await Echo.Suspect decompensation secondary to Afib/AFultter with RVR.Cardiology following.  Active Problems:   Atrial flutter with rapid ventricular response:admitted and started on IV Cardizem infusion, Heparin gtt. Will transition to oral metoprolol and Digoxin. Case Management to help with Eliquis assistance. Await Echo.Cards Following.    Diabetes mellitus type 2, uncontrolled:check a1c, for now continue with Glyburide and SSI.     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  Disposition: Remain inpatient-suspect home in next few days   Antibiotics:None  DVT Prophylaxis: Heparin gtt  Code Status: Full code   Family Communication None at bedside  Procedures:  None  CONSULTS:  cardiology  Time spent 40 minutes-which includes 50% of the time with face-to-face with patient/ family and coordinating care related to the above assessment and plan.    MEDICATIONS: Scheduled Meds: . digoxin  0.25 mg Oral Daily  . [START ON 03/23/2014] furosemide  40 mg Intravenous Q12H  . glyBURIDE  5 mg Oral Q breakfast  . insulin aspart  0-9 Units Subcutaneous TID WC  . metoprolol tartrate  25 mg Oral BID  . sodium chloride  3 mL Intravenous Q12H   Continuous Infusions: . heparin 1,800 Units/hr (03/22/14 1006)   PRN Meds:.acetaminophen **OR** acetaminophen, ondansetron **OR** ondansetron (ZOFRAN) IV  Antibiotics: Anti-infectives    None       PHYSICAL EXAM: Vital signs in last 24 hours: Filed Vitals:   03/22/14 1000 03/22/14 1030 03/22/14 1100 03/22/14  1400  BP: 109/73 109/62 109/55   Pulse: 75 83 61 82  Temp:      TempSrc:      Resp: 15 25 14    Height:      Weight:      SpO2: 93% 91% 96% 89%    Weight change:  Filed Weights   03/21/14 2114  Weight: 129.275 kg (285 lb)   Body mass index is 39.77 kg/(m^2).   Gen Exam: Awake and alert with clear speech.  Rosacea in face Neck: Supple, No JVD.   Chest: B/L Clear.   CVS: S1 S2 irregular, no murmurs.  Abdomen: soft, BS +, non tender, non distended.  Extremities: 1-2 + edema, lower extremities warm to touch. Neurologic: Non Focal.   Skin: No Rash.   Wounds: N/A.   Intake/Output from previous day:  Intake/Output Summary (Last 24 hours) at 03/22/14 1513 Last data filed at 03/22/14 0645  Gross per 24 hour  Intake      0 ml  Output   4325 ml  Net  -4325 ml     LAB RESULTS: CBC  Recent Labs Lab 03/21/14 2126 03/22/14 0758  WBC 11.7* 9.5  HGB 12.9 12.8  HCT 39.6 39.8  PLT 231 218  MCV 102.6* 103.1*  MCH 33.4 33.2  MCHC 32.6 32.2  RDW 16.2* 16.2*    Chemistries   Recent Labs Lab 03/21/14 2126  NA 138  K 5.0  CL 102  CO2 22  GLUCOSE 362*  BUN 15  CREATININE 1.01  CALCIUM 8.8    CBG:  Recent Labs Lab 03/22/14 0703  GLUCAP 241*    GFR Estimated  Creatinine Clearance: 84.8 mL/min (by C-G formula based on Cr of 1.01).  Coagulation profile  Recent Labs Lab 03/21/14 2222  INR 1.12    Cardiac Enzymes  Recent Labs Lab 03/22/14 0105 03/22/14 0758 03/22/14 1250  TROPONINI <0.30 <0.30 <0.30    Invalid input(s): POCBNP  Recent Labs  03/22/14 0105  DDIMER 1.38*   No results for input(s): HGBA1C in the last 72 hours. No results for input(s): CHOL, HDL, LDLCALC, TRIG, CHOLHDL, LDLDIRECT in the last 72 hours.  Recent Labs  03/22/14 0105  TSH 5.340*  T3FREE 3.0   No results for input(s): VITAMINB12, FOLATE, FERRITIN, TIBC, IRON, RETICCTPCT in the last 72 hours. No results for input(s): LIPASE, AMYLASE in the last 72 hours.  Urine  Studies No results for input(s): UHGB, CRYS in the last 72 hours.  Invalid input(s): UACOL, UAPR, USPG, UPH, UTP, UGL, UKET, UBIL, UNIT, UROB, ULEU, UEPI, UWBC, URBC, UBAC, CAST, UCOM, BILUA  MICROBIOLOGY: No results found for this or any previous visit (from the past 240 hour(s)).  RADIOLOGY STUDIES/RESULTS: Dg Chest 2 View  03/21/2014   CLINICAL DATA:  Shortness of breath. History of atrial fibrillation.  EXAM: CHEST  2 VIEW  COMPARISON:  None.  FINDINGS: Cardiac enlargement with mild pulmonary vascular congestion. Interstitial changes in the lungs likely due to edema. Pre-existing interstitial fibrosis could also have this appearance. Blunting of the costophrenic angles suggesting small bilateral pleural effusions. No focal consolidation in the lungs. No pneumothorax. Tortuous aorta.  IMPRESSION: Cardiac enlargement with pulmonary vascular congestion and probable interstitial edema. Small effusions bilaterally.   Electronically Signed   By: Burman Nieves M.D.   On: 03/21/2014 22:10    Jeoffrey Massed, MD  [facility] Pager:[phone]  If 7PM-7AM, please contact night-coverage [email or web address] Password [email or web address] 03/22/2014, 3:13 PM   LOS: 1 day

## 2014-03-22 NOTE — ED Notes (Signed)
Spoke with pharmacy advised to give Diabeta tablet with lunch since it just came up.

## 2014-03-22 NOTE — ED Notes (Signed)
Dr. Tilley at bedside. 

## 2014-03-22 NOTE — Progress Notes (Signed)
ANTICOAGULATION CONSULT NOTE - Follow-up  Pharmacy Consult for heparin Indication: atrial fibrillation  Allergies  Allergen Reactions  . Caffeine Other (See Comments)    Seizure  . Penicillins Other (See Comments)    Childhood allery    Patient Measurements: Height: 5\' 11"  (180.3 cm) Weight: 285 lb (129.275 kg) IBW/kg (Calculated) : 70.8 Heparin Dosing Weight: 100kg  Vital Signs: BP: 107/68 mmHg (11/06 0845) Pulse Rate: 70 (11/06 0845)  Labs:  Recent Labs  03/21/14 2126 03/21/14 2222 03/22/14 0105 03/22/14 0758  HGB 12.9  --   --  12.8  HCT 39.6  --   --  39.8  PLT 231  --   --  218  LABPROT  --  14.5  --   --   INR  --  1.12  --   --   HEPARINUNFRC  --   --   --  0.18*  CREATININE 1.01  --   --   --   TROPONINI  --   --  <0.30 <0.30    Estimated Creatinine Clearance: 84.8 mL/min (by C-G formula based on Cr of 1.01).  Assessment: 63yo female c/o chest pressue, SOB, and weakness, HR found to be in 180s, admitted for new-onset Afib w/ RVR (has h/o Afib in past) and acute heart failure likely 2/2 Afib. She continues on IV heparin with initial level subtherapeutic at 0.18. CBC is WNL and no bleeding noted. Plan to begin NOAC if possible w/ Medicaid, concern for compliance with Coumadin.  Goal of Therapy:  Heparin level 0.3-0.7 units/ml Monitor platelets by anticoagulation protocol: Yes   Plan:  1. Heparin bolus 2000 units IV x 1 then increase to 1800 units/hr 2. Check a 6 hour heparin level  3. Continue daily heparin level and CBC 4. F/u plans for oral anticoagulation  Lysle Pearl, PharmD, BCPS Pager # [phone] 03/22/2014 9:58 AM

## 2014-03-22 NOTE — ED Notes (Signed)
Pt very upset of everything that we are doing for her, pt states she just wants to get a foley catheter because she doesn't wants to get up all the time to [facility]. Pt very unsatisfied with our care and refuses to get any kind of education. Pt oriented about the reason of treatment, but she still very anxious and upset with all what we do to help her.

## 2014-03-22 NOTE — Progress Notes (Signed)
  Echocardiogram 2D Echocardiogram has been performed.  Cathie Beams 03/22/2014, 2:56 PM

## 2014-03-22 NOTE — Progress Notes (Signed)
ANTICOAGULATION CONSULT NOTE - Initial Consult  Pharmacy Consult for heparin Indication: atrial fibrillation  Allergies  Allergen Reactions  . Caffeine Other (See Comments)    Seizure  . Penicillins Other (See Comments)    Childhood allery    Patient Measurements: Height: 5\' 11"  (180.3 cm) Weight: 285 lb (129.275 kg) IBW/kg (Calculated) : 70.8 Heparin Dosing Weight: 100kg  Vital Signs: Temp: 97.9 F (36.6 C) (11/05 2114) Temp Source: Oral (11/05 2114) BP: 127/76 mmHg (11/06 0044) Pulse Rate: 125 (11/06 0044)  Labs:  Recent Labs  03/21/14 2126 03/21/14 2222  HGB 12.9  --   HCT 39.6  --   PLT 231  --   LABPROT  --  14.5  INR  --  1.12  CREATININE 1.01  --     Estimated Creatinine Clearance: 84.8 mL/min (by C-G formula based on Cr of 1.01).   Medical History: Past Medical History  Diagnosis Date  . CHF (congestive heart failure)   . Atrial fibrillation   . Diabetes mellitus without complication     no insuline dependent  . Seizures      Assessment: 63yo female c/o chest pressue, SOB, and weakness, HR found to be in 180s, admitted for new-onset Afib w/ RVR (has h/o Afib in past) and acute heart failure likely 2/2 Afib, to begin heparin; plan to begin NOAC if possible w/ Medicaid, concern for compliance with Coumadin.  Goal of Therapy:  Heparin level 0.3-0.7 units/ml Monitor platelets by anticoagulation protocol: Yes   Plan:  Will give heparin 4000 units IV bolus x1 followed by gtt at 1600 units/hr and monitor heparin levels and CBC.  Vernard Gambles, PharmD, BCPS  03/22/2014,1:09 AM

## 2014-03-22 NOTE — ED Notes (Signed)
Cardiology at the bedside.

## 2014-03-22 NOTE — ED Notes (Signed)
Phlebotomy at bedside.

## 2014-03-22 NOTE — ED Notes (Signed)
Lunch Meal tray ordered

## 2014-03-22 NOTE — Progress Notes (Signed)
ANTICOAGULATION CONSULT NOTE - Follow-up  Pharmacy Consult for heparin Indication: atrial fibrillation  Allergies  Allergen Reactions  . Caffeine Other (See Comments)    Seizure  . Penicillins Other (See Comments)    Childhood allery    Patient Measurements: Height: 5\' 11"  (180.3 cm) Weight: 282 lb 6.6 oz (128.1 kg) IBW/kg (Calculated) : 70.8 Heparin Dosing Weight: 100kg  Vital Signs: Temp: 97.4 F (36.3 C) (11/06 1932) Temp Source: Oral (11/06 1932) BP: 103/65 mmHg (11/06 1932) Pulse Rate: 69 (11/06 1932)  Labs:  Recent Labs  03/21/14 2126 03/21/14 2222 03/22/14 0105 03/22/14 0758 03/22/14 1250 03/22/14 1312 03/22/14 1500 03/22/14 1930  HGB 12.9  --   --  12.8  --   --   --   --   HCT 39.6  --   --  39.8  --   --   --   --   PLT 231  --   --  218  --   --   --   --   LABPROT  --  14.5  --   --   --   --   --   --   INR  --  1.12  --   --   --   --   --   --   HEPARINUNFRC  --   --   --  0.18*  --   --  0.14* 0.23*  CREATININE 1.01  --   --   --   --  0.92  --   --   TROPONINI  --   --  <0.30 <0.30 <0.30  --   --   --     Estimated Creatinine Clearance: 92.6 mL/min (by C-G formula based on Cr of 0.92).  Assessment: 63yo female c/o chest pressue, SOB, and weakness, HR found to be in 180s, admitted for new-onset Afib w/ RVR (has h/o Afib in past) and acute heart failure likely 2/2 Afib. She continues on IV heparin- initial level subtherapeutic this morning at 0.18- a 2000 unit bolus and a rate increase to 1800 units/hr was ordered. Charting looked odd, and I spoke with RN on 3S, Liza. She stated when patient came up from ED her heparin was running at 1mL/hr instead of the ordered 69mL/hr and the rate was changed to match the correct order at 1400.  There was a heparin level drawn at 1500 which was drawn 2 hours early for unknown reasons. This was low at 0.14, however this is more reflective of the 1500units/hr dose the heparin was actually running at per RN rather  than the ordered dose of 1800 units/hr.  Repeat heparin level drawn at 8 pm = 0.23  Goal of Therapy:  Heparin level 0.3-0.7 units/ml Monitor platelets by anticoagulation protocol: Yes   Plan:  1. Increase heparin to 2100 units / hr 2. Follow up AM labs  Thank you. Okey Regal, PharmD [phone]  03/22/2014 8:17 PM

## 2014-03-22 NOTE — Progress Notes (Signed)
Subjective:  Admitted last night with worsening shortness of breath and rapid atrial flutter. She moved here recently from [place] and had run out of all of her medications and quit taking them. Unclear what her exact diagnosis have been in the past in terms of heart failure. She has a history of atrial arrhythmias and evidently took Pradaxa at one point in the past but has been on nothing recently.  Objective:  Vital Signs in the last 24 hours: BP 109/55 mmHg  Pulse 61  Temp(Src) 97.9 F (36.6 C) (Oral)  Resp 14  Ht 5\' 11"  (1.803 m)  Wt 129.275 kg (285 lb)  BMI 39.77 kg/m2  SpO2 96%  Physical Exam: Obese female currently in no acute distress Skin: Significant rosacea Lungs:  Clear Cardiac: irregular rhythm, normal S1 and S2, no S3 Abdomen:  Soft, nontender, no masses Extremities:  2+ peripheral edema  Intake/Output from previous day: 11/05 0701 - 11/06 0700 In: -  Out: 4325 [Urine:4325]  Weight Filed Weights   03/21/14 2114  Weight: 129.275 kg (285 lb)    Lab Results: Basic Metabolic Panel:  Recent Labs  82/99/37 2126  NA 138  K 5.0  CL 102  CO2 22  GLUCOSE 362*  BUN 15  CREATININE 1.01   CBC:  Recent Labs  03/21/14 2126 03/22/14 0758  WBC 11.7* 9.5  HGB 12.9 12.8  HCT 39.6 39.8  MCV 102.6* 103.1*  PLT 231 218   Cardiac Enzymes:  Recent Labs  03/22/14 0105 03/22/14 0758  TROPONINI <0.30 <0.30    Telemetry: Atrial flutter currently controlled  Assessment/Plan:  1. Atrial flutter with rapid ventricular response currently rate controlled 2. Medical noncompliance 3. History of congestive heart failure type undetermined 4. Diabetes mellitus poorly controlled 5. Obesity with need to lose weight  Recommendations:  Await results of echocardiogram. Change to oral diltiazem provided her rate is controlled. Restart her other medications. We'll need to obtain records from hospitalization in [place]. Diuresis. Obtain metabolic panel as well as  BNP level. Will follow. Her CHADS2VASC2 score is a minimum of 4 so she will need to have long-term anticoagulation on discharge.      Darden Palmer  MD [facility]  03/22/2014, 1:03 PM

## 2014-03-22 NOTE — Progress Notes (Signed)
ANTICOAGULATION CONSULT NOTE - Follow-up  Pharmacy Consult for heparin Indication: atrial fibrillation  Allergies  Allergen Reactions  . Caffeine Other (See Comments)    Seizure  . Penicillins Other (See Comments)    Childhood allery    Patient Measurements: Height: 5\' 11"  (180.3 cm) Weight: 285 lb (129.275 kg) IBW/kg (Calculated) : 70.8 Heparin Dosing Weight: 100kg  Vital Signs: BP: 109/55 mmHg (11/06 1100) Pulse Rate: 82 (11/06 1400)  Labs:  Recent Labs  03/21/14 2126 03/21/14 2222 03/22/14 0105 03/22/14 0758 03/22/14 1250 03/22/14 1312 03/22/14 1500  HGB 12.9  --   --  12.8  --   --   --   HCT 39.6  --   --  39.8  --   --   --   PLT 231  --   --  218  --   --   --   LABPROT  --  14.5  --   --   --   --   --   INR  --  1.12  --   --   --   --   --   HEPARINUNFRC  --   --   --  0.18*  --   --  0.14*  CREATININE 1.01  --   --   --   --  0.92  --   TROPONINI  --   --  <0.30 <0.30 <0.30  --   --     Estimated Creatinine Clearance: 93.1 mL/min (by C-G formula based on Cr of 0.92).  Assessment: 63yo female c/o chest pressue, SOB, and weakness, HR found to be in 180s, admitted for new-onset Afib w/ RVR (has h/o Afib in past) and acute heart failure likely 2/2 Afib. She continues on IV heparin- initial level subtherapeutic this morning at 0.18- a 2000 unit bolus and a rate increase to 1800 units/hr was ordered. Charting looked odd, and I spoke with RN on 3S, Liza. She stated when patient came up from ED her heparin was running at 60mL/hr instead of the ordered 61mL/hr and the rate was changed to match the correct order at 1400.  There was a heparin level drawn at 1500 which was drawn 2 hours early for unknown reasons. This was low at 0.14, however this is more reflective of the 1500units/hr dose the heparin was actually running at per RN rather than the ordered dose of 1800 units/hr.  Goal of Therapy:  Heparin level 0.3-0.7 units/ml Monitor platelets by anticoagulation  protocol: Yes   Plan:  1. Continue heparin at 1800 units/hr and recheck a level at 2000 (this will be 6 full hours on the correct rate)  Zera Markwardt D. Aiyanah Kalama, PharmD, BCPS Clinical Pharmacist Pager: [phone] 03/22/2014 5:26 PM

## 2014-03-23 ENCOUNTER — Inpatient Hospital Stay (HOSPITAL_COMMUNITY): Payer: Medicaid - Out of State

## 2014-03-23 DIAGNOSIS — E1165 Type 2 diabetes mellitus with hyperglycemia: Secondary | ICD-10-CM

## 2014-03-23 DIAGNOSIS — E669 Obesity, unspecified: Secondary | ICD-10-CM

## 2014-03-23 DIAGNOSIS — Z9119 Patient's noncompliance with other medical treatment and regimen: Secondary | ICD-10-CM

## 2014-03-23 DIAGNOSIS — Z91199 Patient's noncompliance with other medical treatment and regimen due to unspecified reason: Secondary | ICD-10-CM

## 2014-03-23 DIAGNOSIS — I4892 Unspecified atrial flutter: Secondary | ICD-10-CM

## 2014-03-23 DIAGNOSIS — I482 Chronic atrial fibrillation, unspecified: Secondary | ICD-10-CM | POA: Insufficient documentation

## 2014-03-23 DIAGNOSIS — I5023 Acute on chronic systolic (congestive) heart failure: Secondary | ICD-10-CM

## 2014-03-23 LAB — LIPID PANEL
Cholesterol: 104 mg/dL (ref 0–200)
HDL: 36 mg/dL — ABNORMAL LOW (ref >39)
LDL Cholesterol: 49 mg/dL (ref 0–99)
Total CHOL/HDL Ratio: 2.9 RATIO
Triglycerides: 93 mg/dL (ref <150)
VLDL: 19 mg/dL (ref 0–40)

## 2014-03-23 LAB — CBC
HCT: 38.1 % (ref 36.0–46.0)
Hemoglobin: 12.5 g/dL (ref 12.0–15.0)
MCH: 33.2 pg (ref 26.0–34.0)
MCHC: 32.8 g/dL (ref 30.0–36.0)
MCV: 101.1 fL — ABNORMAL HIGH (ref 78.0–100.0)
Platelets: 229 10*3/uL (ref 150–400)
RBC: 3.77 MIL/uL — ABNORMAL LOW (ref 3.87–5.11)
RDW: 16 % — ABNORMAL HIGH (ref 11.5–15.5)
WBC: 10 10*3/uL (ref 4.0–10.5)

## 2014-03-23 LAB — BASIC METABOLIC PANEL
Anion gap: 14 (ref 5–15)
BUN: 15 mg/dL (ref 6–23)
CO2: 25 mEq/L (ref 19–32)
Calcium: 9.4 mg/dL (ref 8.4–10.5)
Chloride: 99 mEq/L (ref 96–112)
Creatinine, Ser: 1 mg/dL (ref 0.50–1.10)
GFR calc Af Amer: 68 mL/min — ABNORMAL LOW (ref >90)
GFR calc non Af Amer: 59 mL/min — ABNORMAL LOW (ref >90)
Glucose, Bld: 162 mg/dL — ABNORMAL HIGH (ref 70–99)
Potassium: 3.9 mEq/L (ref 3.7–5.3)
Sodium: 138 mEq/L (ref 137–147)

## 2014-03-23 LAB — HEPARIN LEVEL (UNFRACTIONATED)
Heparin Unfractionated: 0.26 IU/mL — ABNORMAL LOW (ref 0.30–0.70)
Heparin Unfractionated: 0.55 IU/mL (ref 0.30–0.70)
Heparin Unfractionated: 0.7 IU/mL (ref 0.30–0.70)

## 2014-03-23 LAB — GLUCOSE, CAPILLARY
Glucose-Capillary: 71 mg/dL (ref 70–99)
Glucose-Capillary: 91 mg/dL (ref 70–99)
Glucose-Capillary: 119 mg/dL — ABNORMAL HIGH (ref 70–99)
Glucose-Capillary: 159 mg/dL — ABNORMAL HIGH (ref 70–99)
Glucose-Capillary: 164 mg/dL — ABNORMAL HIGH (ref 70–99)
Glucose-Capillary: 187 mg/dL — ABNORMAL HIGH (ref 70–99)
Glucose-Capillary: 151 mg/dL — ABNORMAL HIGH (ref 70–99)

## 2014-03-23 MED ORDER — DILTIAZEM HCL 100 MG IV SOLR
5.0000 mg/h | Freq: Once | INTRAVENOUS | Status: AC
  Administered 2014-03-23: 15 mg/h via INTRAVENOUS
  Filled 2014-03-23: qty 100

## 2014-03-23 NOTE — Plan of Care (Signed)
Problem: Phase I Progression Outcomes Goal: Dyspnea controlled at rest (HF) Outcome: Progressing Goal: Pain controlled with appropriate interventions Outcome: Completed/Met Date Met:  03/23/14 Goal: EF % per last Echo/documented,Core Reminder form on chart Outcome: Progressing Goal: Hemodynamically stable Outcome: Progressing

## 2014-03-23 NOTE — Progress Notes (Signed)
Subjective:  Patient belligerent this am and demanding to go home. No c/o SOB or chest pain. Objective:  Vital Signs in the last 24 hours: BP 123/69 mmHg  Pulse 81  Temp(Src) 97.7 F (36.5 C) (Oral)  Resp 20  Ht 5\' 11"  (1.803 m)  Wt 128.6 kg (283 lb 8.2 oz)  BMI 39.56 kg/m2  SpO2 96%  Physical Exam: Obese female belligerent Skin: Significant rosacea Lungs:  Clear Cardiac: irregular rhythm, normal S1 and S2, no S3 Abdomen:  Soft, nontender, no masses Extremities:  1+ peripheral edema  Intake/Output from previous day: 11/06 0701 - 11/07 0700 In: 1032.1 [P.O.:480; I.V.:552.1] Out: 400 [Urine:400]  Weight Filed Weights   03/21/14 2114 03/22/14 1400 03/23/14 0356  Weight: 129.275 kg (285 lb) 128.1 kg (282 lb 6.6 oz) 128.6 kg (283 lb 8.2 oz)    Lab Results: Basic Metabolic Panel:  Recent Labs  18/84/16 1312 03/23/14 0828  NA 136* 138  K 3.7 3.9  CL 98 99  CO2 23 25  GLUCOSE 296* 162*  BUN 15 15  CREATININE 0.92 1.00   CBC:  Recent Labs  03/22/14 0758 03/23/14 0249  WBC 9.5 10.0  HGB 12.8 12.5  HCT 39.8 38.1  MCV 103.1* 101.1*  PLT 218 229   Cardiac Enzymes:  Recent Labs  03/22/14 0105 03/22/14 0758 03/22/14 1250  TROPONINI <0.30 <0.30 <0.30    Telemetry: Atrial fib currently controlled  Assessment/Plan:  1. Atrial flutter with rapid ventricular response  On admission currently rate controlled on diltiazem 2. Medical noncompliance 3. Severe systolic CHF acute on chronic 4. Diabetes mellitus poorly controlled 5. Obesity with need to lose weight  Recommendations:  Difficult and argumentative today. Demanding discharge. I spoke to the advanced CHF clinic and she will need her followup there due to the severity of her condition and lack of resources. Need to get old records.  1. I would begin Eliquis and stop heparin Not good candidate for warfarin 2. D/c IV cardiazem and titrate beta blockers. .  3. Outpatient f/u with advanced CHF.  I  spoke to Dr. Shirlee Latch yesterday.   Darden Palmer  MD [facility]  03/23/2014, 9:56 AM

## 2014-03-23 NOTE — Progress Notes (Signed)
ANTICOAGULATION CONSULT NOTE - Follow Up Consult  Pharmacy Consult for heparin Indication: atrial fibrillation  Labs:  Recent Labs  03/21/14 2126 03/21/14 2222 03/22/14 0105  03/22/14 0758 03/22/14 1250 03/22/14 1312 03/22/14 1500 03/22/14 1930 03/23/14 0249 03/23/14 0250  HGB 12.9  --   --   --  12.8  --   --   --   --  12.5  --   HCT 39.6  --   --   --  39.8  --   --   --   --  38.1  --   PLT 231  --   --   --  218  --   --   --   --  229  --   LABPROT  --  14.5  --   --   --   --   --   --   --   --   --   INR  --  1.12  --   --   --   --   --   --   --   --   --   HEPARINUNFRC  --   --   --   < > 0.18*  --   --  0.14* 0.23*  --  0.26*  CREATININE 1.01  --   --   --   --   --  0.92  --   --   --   --   TROPONINI  --   --  <0.30  --  <0.30 <0.30  --   --   --   --   --   < > = values in this interval not displayed.   Assessment: 62yo female remains subtherapeutic on heparin with little movement in level after fairly aggressive rate change.  Goal of Therapy:  Heparin level 0.3-0.7 units/ml   Plan:  Will increase heparin gtt by 3 units/kg/hr to 2400 units/hr and check level in 6hr.  Vernard Gambles, PharmD, BCPS  03/23/2014,3:42 AM

## 2014-03-23 NOTE — Progress Notes (Signed)
PATIENT DETAILS Name: Kaitlyn [name] Age: 63 y.o. Sex: female Date of Birth: [birth year] Admit Date: 03/21/2014 Admitting Physician Eduard Clos, MD PCP:No PCP Per Patient  Subjective: Feels much better-wants to be discharged today! Claims she cant "just lie around the whole day"!  Assessment/Plan: Principal Problem:   Acute Systolic Heart Failure: Improved but still has excess vol on board, continue IV lasix. Echo shows 15-20% .Suspect decompensation secondary to Afib/AFultter with RVR/non compliance to medications.Cardiology following.  Active Problems:   Atrial flutter with rapid ventricular response:admitted and started on IV Cardizem infusion, Heparin gtt.Heart rate better,but not yet optimal, per RN remains on Cardizem gtt. Will slowly wean off Cardizem gtt and transition to oral metoprolol and Digoxin. Case Management consulted to help with Eliquis assistance.Cards Following.  Cardiomyopathy:etiology-claims never had a cardiac cath in the past, could also be rate related cardiomyopathy. Defer work up to cards    Diabetes mellitus type 2, uncontrolled:A1c 8.6, for now continue with Glyburide and SSI.     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    Non Compliance:agitated with this MD, demanding discharge, tried explaining she was not ready yet for discharge-subsequently became very belligerent. I suspect that her CHF decompensation-is due to lack of compliance to medications/follow up. Difficult situation-just moved into town-no Primary care established yet-demanding discharge when she is not clearly ready!I suspect that if she leaves, she will likely come back in a worse condition. I tried explaining the risks to the patient-she was not having any of it!  Disposition: Remain inpatient-but suspect she will AMA   Antibiotics:None  DVT Prophylaxis: Heparin gtt  Code Status: Full code   Family  Communication None at bedside  Procedures:  None  CONSULTS:  cardiology  Time spent 40 minutes-which includes 50% of the time with face-to-face with patient/ family and coordinating care related to the above assessment and plan.  MEDICATIONS: Scheduled Meds: . digoxin  0.25 mg Oral Daily  . furosemide  40 mg Intravenous Q12H  . glyBURIDE  5 mg Oral Q breakfast  . insulin aspart  0-9 Units Subcutaneous TID WC  . metoprolol tartrate  25 mg Oral BID  . sodium chloride  3 mL Intravenous Q12H   Continuous Infusions: . heparin 2,400 Units/hr (03/23/14 0341)   PRN Meds:.acetaminophen **OR** acetaminophen, metoprolol, ondansetron **OR** ondansetron (ZOFRAN) IV  Antibiotics: Anti-infectives    None       PHYSICAL EXAM: Vital signs in last 24 hours: Filed Vitals:   03/23/14 0356 03/23/14 0713 03/23/14 0850 03/23/14 0851  BP: 118/81 126/90  123/69  Pulse: 124 69 90 81  Temp: 97.7 F (36.5 C)     TempSrc: Oral     Resp: 23 20    Height:      Weight: 128.6 kg (283 lb 8.2 oz)     SpO2: 91% 96%      Weight change: -1.175 kg (-2 lb 9.5 oz) Filed Weights   03/21/14 2114 03/22/14 1400 03/23/14 0356  Weight: 129.275 kg (285 lb) 128.1 kg (282 lb 6.6 oz) 128.6 kg (283 lb 8.2 oz)   Body mass index is 39.56 kg/(m^2).   Gen Exam: Awake and alert with clear speech.  Rosacea in face Neck: Supple, No JVD.   Chest: Very few bibasilar rales CVS: S1 S2 irregular, no murmurs.  Abdomen: soft, BS +, non tender, non distended.  Extremities: 1-2 + edema, lower extremities  warm to touch. Neurologic: Non Focal.   Skin: No Rash.   Wounds: N/A.   Intake/Output from previous day:  Intake/Output Summary (Last 24 hours) at 03/23/14 0959 Last data filed at 03/23/14 0713  Gross per 24 hour  Intake 1542.47 ml  Output    400 ml  Net 1142.47 ml     LAB RESULTS: CBC  Recent Labs Lab 03/21/14 2126 03/22/14 0758 03/23/14 0249  WBC 11.7* 9.5 10.0  HGB 12.9 12.8 12.5  HCT 39.6 39.8  38.1  PLT 231 218 229  MCV 102.6* 103.1* 101.1*  MCH 33.4 33.2 33.2  MCHC 32.6 32.2 32.8  RDW 16.2* 16.2* 16.0*    Chemistries   Recent Labs Lab 03/21/14 2126 03/22/14 1312 03/23/14 0828  NA 138 136* 138  K 5.0 3.7 3.9  CL 102 98 99  CO2 22 23 25   GLUCOSE 362* 296* 162*  BUN 15 15 15   CREATININE 1.01 0.92 1.00  CALCIUM 8.8 9.1 9.4    CBG:  Recent Labs Lab 03/22/14 1454 03/22/14 1818 03/22/14 1821 03/23/14 0159 03/23/14 0830  GLUCAP 282* 71 91 119* 159*    GFR Estimated Creatinine Clearance: 85.4 mL/min (by C-G formula based on Cr of 1).  Coagulation profile  Recent Labs Lab 03/21/14 2222  INR 1.12    Cardiac Enzymes  Recent Labs Lab 03/22/14 0105 03/22/14 0758 03/22/14 1250  TROPONINI <0.30 <0.30 <0.30    Invalid input(s): POCBNP  Recent Labs  03/22/14 0105  DDIMER 1.38*    Recent Labs  03/22/14 0105  HGBA1C 8.6*    Recent Labs  03/23/14 0250  CHOL 104  HDL 36*  LDLCALC 49  TRIG 93  CHOLHDL 2.9    Recent Labs  03/22/14 0105  TSH 5.340*  T3FREE 3.0   No results for input(s): VITAMINB12, FOLATE, FERRITIN, TIBC, IRON, RETICCTPCT in the last 72 hours. No results for input(s): LIPASE, AMYLASE in the last 72 hours.  Urine Studies No results for input(s): UHGB, CRYS in the last 72 hours.  Invalid input(s): UACOL, UAPR, USPG, UPH, UTP, UGL, UKET, UBIL, UNIT, UROB, ULEU, UEPI, UWBC, URBC, UBAC, CAST, UCOM, BILUA  MICROBIOLOGY: Recent Results (from the past 240 hour(s))  MRSA PCR Screening     Status: None   Collection Time: 03/22/14  2:00 PM  Result Value Ref Range Status   MRSA by PCR NEGATIVE NEGATIVE Final    Comment:        The GeneXpert MRSA Assay (FDA approved for NASAL specimens only), is one component of a comprehensive MRSA colonization surveillance program. It is not intended to diagnose MRSA infection nor to guide or monitor treatment for MRSA infections.     RADIOLOGY STUDIES/RESULTS: Dg Chest 2  View  03/21/2014   CLINICAL DATA:  Shortness of breath. History of atrial fibrillation.  EXAM: CHEST  2 VIEW  COMPARISON:  None.  FINDINGS: Cardiac enlargement with mild pulmonary vascular congestion. Interstitial changes in the lungs likely due to edema. Pre-existing interstitial fibrosis could also have this appearance. Blunting of the costophrenic angles suggesting small bilateral pleural effusions. No focal consolidation in the lungs. No pneumothorax. Tortuous aorta.  IMPRESSION: Cardiac enlargement with pulmonary vascular congestion and probable interstitial edema. Small effusions bilaterally.   Electronically Signed   By: Burman NievesWilliam  Stevens M.D.   On: 03/21/2014 22:10    Jeoffrey MassedGHIMIRE,Maggy Wyble, MD  [facility] Pager:[phone]  If 7PM-7AM, please contact night-coverage [email or web address] Password [email or web address] 03/23/2014, 9:59 AM   LOS: 2 days

## 2014-03-23 NOTE — Progress Notes (Signed)
ANTICOAGULATION CONSULT NOTE - Follow-up  Pharmacy Consult for heparin Indication: atrial fibrillation  Allergies  Allergen Reactions  . Caffeine Other (See Comments)    Seizure  . Penicillins Other (See Comments)    Childhood allery    Patient Measurements: Height: 5\' 11"  (180.3 cm) Weight: 283 lb 8.2 oz (128.6 kg) IBW/kg (Calculated) : 70.8 Heparin Dosing Weight: 100kg  Vital Signs: Temp: 98.2 F (36.8 C) (11/07 0713) Temp Source: Oral (11/07 0713) BP: 123/69 mmHg (11/07 0851) Pulse Rate: 81 (11/07 0851)  Labs:  Recent Labs  03/21/14 2126 03/21/14 2222 03/22/14 0105 03/22/14 0758 03/22/14 1250 03/22/14 1312  03/22/14 1930 03/23/14 0249 03/23/14 0250 03/23/14 0828 03/23/14 1000  HGB 12.9  --   --  12.8  --   --   --   --  12.5  --   --   --   HCT 39.6  --   --  39.8  --   --   --   --  38.1  --   --   --   PLT 231  --   --  218  --   --   --   --  229  --   --   --   LABPROT  --  14.5  --   --   --   --   --   --   --   --   --   --   INR  --  1.12  --   --   --   --   --   --   --   --   --   --   HEPARINUNFRC  --   --   --  0.18*  --   --   < > 0.23*  --  0.26*  --  0.55  CREATININE 1.01  --   --   --   --  0.92  --   --   --   --  1.00  --   TROPONINI  --   --  <0.30 <0.30 <0.30  --   --   --   --   --   --   --   < > = values in this interval not displayed.  Estimated Creatinine Clearance: 85.4 mL/min (by C-G formula based on Cr of 1).  Assessment: 62 yof continuing on heparin IV for new-onset Afib w/ RVR (has h/o Afib in past). HL therapeutic 0.55 x1 this AM @2400  units/hr. CBC wnl, no bleeding documented. Case management has been consulted to help with Eliquis transition but no orders yet.  Repeat heparin level drawn at 8 pm = 0.23  Goal of Therapy:  Heparin level 0.3-0.7 units/ml Monitor platelets by anticoagulation protocol: Yes   Plan:  -Cont Hep @2400  units/hr -6 hour HL to confirm @1600  -Daily HL and CBC -F/u plans for oral [facility]

## 2014-03-23 NOTE — Progress Notes (Signed)
Pharmacy: Re-Heparin  Patient is a 63 y.o F on heparin for afib.  Repeat heparin level now back therapeutic but is at upper end of therapeutic range.  No bleeding documented  Plan: 1) Decrease rate down slightly to 2300 units/hr 2) Will f/u with AM labs and adjust as needed  Dorna Leitz, PharmD, BCPS

## 2014-03-24 LAB — CBC
HCT: 39.5 % (ref 36.0–46.0)
Hemoglobin: 12.6 g/dL (ref 12.0–15.0)
MCH: 32.2 pg (ref 26.0–34.0)
MCHC: 31.9 g/dL (ref 30.0–36.0)
MCV: 101 fL — ABNORMAL HIGH (ref 78.0–100.0)
Platelets: 220 10*3/uL (ref 150–400)
RBC: 3.91 MIL/uL (ref 3.87–5.11)
RDW: 16 % — ABNORMAL HIGH (ref 11.5–15.5)
WBC: 8.6 10*3/uL (ref 4.0–10.5)

## 2014-03-24 LAB — GLUCOSE, CAPILLARY
Glucose-Capillary: 272 mg/dL — ABNORMAL HIGH (ref 70–99)
Glucose-Capillary: 143 mg/dL — ABNORMAL HIGH (ref 70–99)
Glucose-Capillary: 177 mg/dL — ABNORMAL HIGH (ref 70–99)

## 2014-03-24 LAB — BASIC METABOLIC PANEL
Anion gap: 14 (ref 5–15)
BUN: 17 mg/dL (ref 6–23)
CO2: 25 mEq/L (ref 19–32)
Calcium: 9.3 mg/dL (ref 8.4–10.5)
Chloride: 99 mEq/L (ref 96–112)
Creatinine, Ser: 1.07 mg/dL (ref 0.50–1.10)
GFR calc Af Amer: 63 mL/min — ABNORMAL LOW (ref >90)
GFR calc non Af Amer: 54 mL/min — ABNORMAL LOW (ref >90)
Glucose, Bld: 233 mg/dL — ABNORMAL HIGH (ref 70–99)
Potassium: 3.7 mEq/L (ref 3.7–5.3)
Sodium: 138 mEq/L (ref 137–147)

## 2014-03-24 LAB — HEPARIN LEVEL (UNFRACTIONATED): Heparin Unfractionated: 0.73 IU/mL — ABNORMAL HIGH (ref 0.30–0.70)

## 2014-03-24 MED ORDER — APIXABAN 5 MG PO TABS
5.0000 mg | ORAL_TABLET | Freq: Two times a day (BID) | ORAL | Status: DC
  Administered 2014-03-24 – 2014-03-25 (×3): 5 mg via ORAL
  Filled 2014-03-24 (×5): qty 1

## 2014-03-24 MED ORDER — METOPROLOL TARTRATE 50 MG PO TABS
50.0000 mg | ORAL_TABLET | Freq: Two times a day (BID) | ORAL | Status: DC
  Administered 2014-03-24 (×2): 50 mg via ORAL
  Filled 2014-03-24 (×4): qty 1

## 2014-03-24 NOTE — Progress Notes (Signed)
Pt c/o discomfort in legs and buttock from sitting in the same spot. Pt educated on changing position she stated that it was not comfortable to change position. Pt was offered chair and pt stated not at this time. Pt was offered pain medication and refused. Will continue to monitor pt.

## 2014-03-24 NOTE — Progress Notes (Signed)
Subjective:  Decided to stay in the hospital since yesterday. Much more pleasant and cooperative this morning. Sitting up in bed side in chair.  Objective:  Vital Signs in the last 24 hours: BP 110/77 mmHg  Pulse 112  Temp(Src) 97.5 F (36.4 C) (Oral)  Resp 20  Ht 5\' 11"  (1.803 m)  Wt 123.3 kg (271 lb 13.2 oz)  BMI 37.93 kg/m2  SpO2 99%  Physical Exam: Obese female calm and in no acute distressSkin: Significant rosacea Lungs:  Clear Cardiac: irregular rhythm, normal S1 and S2, no S3 Abdomen:  Soft, nontender, no masses Extremities:  1+ peripheral edema  Intake/Output from previous day: 11/07 0701 - 11/08 0700 In: 1651.2 [P.O.:840; I.V.:811.2] Out: 2000 [Urine:2000]  Weight Filed Weights   03/22/14 1400 03/23/14 0356 03/24/14 0500  Weight: 128.1 kg (282 lb 6.6 oz) 128.6 kg (283 lb 8.2 oz) 123.3 kg (271 lb 13.2 oz)    Lab Results: Basic Metabolic Panel:  Recent Labs  05/02/23 0828 03/24/14 0334  NA 138 138  K 3.9 3.7  CL 99 99  CO2 25 25  GLUCOSE 162* 233*  BUN 15 17  CREATININE 1.00 1.07   CBC:  Recent Labs  03/23/14 0249 03/24/14 0334  WBC 10.0 8.6  HGB 12.5 12.6  HCT 38.1 39.5  MCV 101.1* 101.0*  PLT 229 220   Cardiac Enzymes:  Recent Labs  03/22/14 0105 03/22/14 0758 03/22/14 1250  TROPONINI <0.30 <0.30 <0.30    Telemetry: Atrial fib currently controlled  Assessment/Plan:  1. Atrial fibrillation and flutter currently rate controlled with restarting medical therapy 2. Medical noncompliance 3. Severe systolic CHF acute on chronic 4. Diabetes mellitus poorly controlled 5. Obesity with need to lose weight  Recommendations:  Continue to stabilize medicines today. Somebody from advanced heart failure should see her tomorrow prior to discharge to arrange transition of care to their clinic. Clinically much better today.   Darden Palmer  MD [facility]  03/24/2014, 11:52 AM

## 2014-03-24 NOTE — Progress Notes (Signed)
ANTICOAGULATION CONSULT NOTE - Follow Up Consult  Pharmacy Consult for heparin Indication: atrial fibrillation  Labs:  Recent Labs  03/21/14 2222 03/22/14 0105 03/22/14 0758 03/22/14 1250 03/22/14 1312  03/23/14 0249  03/23/14 0828 03/23/14 1000 03/23/14 1655 03/24/14 0334  HGB  --   --  12.8  --   --   --  12.5  --   --   --   --  12.6  HCT  --   --  39.8  --   --   --  38.1  --   --   --   --  39.5  PLT  --   --  218  --   --   --  229  --   --   --   --  220  LABPROT 14.5  --   --   --   --   --   --   --   --   --   --   --   INR 1.12  --   --   --   --   --   --   --   --   --   --   --   HEPARINUNFRC  --   --  0.18*  --   --   < >  --   < >  --  0.55 0.70 0.73*  CREATININE  --   --   --   --  0.92  --   --   --  1.00  --   --  1.07  TROPONINI  --  <0.30 <0.30 <0.30  --   --   --   --   --   --   --   --   < > = values in this interval not displayed.   Assessment: 63yo female now slightly supratherapeutic on heparin despite rate decrease for level at high end of goal.  Goal of Therapy:  Heparin level 0.3-0.7 units/ml   Plan:  Will decrease heparin gtt slightly to 2200 units/hr and check level in 6hr.  Vernard Gambles, PharmD, BCPS  03/24/2014,4:39 AM

## 2014-03-24 NOTE — Progress Notes (Signed)
Pt HR 140's a fib. PRN medication given. Will continue to monitor.

## 2014-03-24 NOTE — Progress Notes (Signed)
ANTICOAGULATION CONSULT NOTE - Follow-up  Pharmacy Consult for apixaban Indication: atrial fibrillation  Allergies  Allergen Reactions  . Caffeine Other (See Comments)    Seizure  . Penicillins Other (See Comments)    Childhood allery    Patient Measurements: Height: 5\' 11"  (180.3 cm) Weight: 271 lb 13.2 oz (123.3 kg) IBW/kg (Calculated) : 70.8 Heparin Dosing Weight: 100kg  Vital Signs: Temp: 97.5 F (36.4 C) (11/08 0737) Temp Source: Oral (11/08 0737) BP: 103/75 mmHg (11/08 0737) Pulse Rate: 91 (11/08 0445)  Labs:  Recent Labs  03/21/14 2222 03/22/14 0105 03/22/14 0758 03/22/14 1250 03/22/14 1312  03/23/14 0249  03/23/14 0828 03/23/14 1000 03/23/14 1655 03/24/14 0334  HGB  --   --  12.8  --   --   --  12.5  --   --   --   --  12.6  HCT  --   --  39.8  --   --   --  38.1  --   --   --   --  39.5  PLT  --   --  218  --   --   --  229  --   --   --   --  220  LABPROT 14.5  --   --   --   --   --   --   --   --   --   --   --   INR 1.12  --   --   --   --   --   --   --   --   --   --   --   HEPARINUNFRC  --   --  0.18*  --   --   < >  --   < >  --  0.55 0.70 0.73*  CREATININE  --   --   --   --  0.92  --   --   --  1.00  --   --  1.07  TROPONINI  --  <0.30 <0.30 <0.30  --   --   --   --   --   --   --   --   < > = values in this interval not displayed.  Estimated Creatinine Clearance: 78 mL/min (by C-G formula based on Cr of 1.07).  Assessment: 74 yof being transitioned to apixaban from heparin IV for Afib w/ RVR (has h/o Afib in past). Instructed nurse to start apixaban at time of heparin drip d/c. CBC wnl, no bleeding documented.  Goal of Therapy:  Heparin level 0.3-0.7 units/ml Monitor platelets by anticoagulation protocol: Yes   Plan:  -D/c heparin drip and start apixaban po 5mg  bid at time of hep d/c - communicated w/ nurse (dose appropriate for renal function, age, weight) -Monitor CBC q72h -Monitor s/sx bleeding  Babs Bertin, PharmD Clinical  Pharmacist - Resident Pager [phone] 03/24/2014 10:32 AM

## 2014-03-24 NOTE — Progress Notes (Signed)
PATIENT DETAILS Name: Kaitlyn [name] Age: 63 y.o. Sex: female Date of Birth: [birth year] Admit Date: 03/21/2014 Admitting Physician Eduard Clos, MD PCP:No PCP Per Patient  Subjective: Feels much better-breathing better-calm/quiet today  Assessment/Plan: Principal Problem:   Acute Systolic Heart Failure: Improved significantly this a.m.weight down to 271 pounds from 285 pounds on admission.Continue IV lasix. Echo shows 15-20% .Continue Lopressor, will add ACEI in the next few days if BP tolerates. Suspect decompensation secondary to Afib/AFultter with RVR/non compliance to medications.Cardiology following.  Active Problems:   Atrial flutter with rapid ventricular response:admitted and started on IV Cardizem infusion, Heparin gtt.Heart rate better,slowly weaned off Cardizem gtt and transition to oral metoprolol and Digoxin. Will increase Metoprolol to 50 mg BID, stop heparin gtt and start Eliquis. Case Management consulted to help with Eliquis assistance.Cards Following.   Cardiomyopathy:?etiology-claims never had a cardiac cath in the past, could also be rate related cardiomyopathy. Defer work up to cards    Diabetes mellitus type 2, uncontrolled:A1c 8.6, for now continue with Glyburide and SSI. CBG's stable.    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    Non Compliance to medications:counseled, initially wanted discharge on 11/7, now willing to stay till she is further optimized.  Disposition: Remain inpatient   Antibiotics:None  DVT Prophylaxis: Eliquis  Code Status: Full code   Family Communication None at bedside  Procedures:  None  CONSULTS:  cardiology   MEDICATIONS: Scheduled Meds: . digoxin  0.25 mg Oral Daily  . furosemide  40 mg Intravenous Q12H  . glyBURIDE  5 mg Oral Q breakfast  . insulin aspart  0-9 Units Subcutaneous TID WC  . metoprolol tartrate  50 mg Oral BID  .  sodium chloride  3 mL Intravenous Q12H   Continuous Infusions: . heparin 2,200 Units/hr (03/24/14 0438)   PRN Meds:.acetaminophen **OR** acetaminophen, metoprolol, ondansetron **OR** ondansetron (ZOFRAN) IV  Antibiotics: Anti-infectives    None       PHYSICAL EXAM: Vital signs in last 24 hours: Filed Vitals:   03/23/14 2306 03/24/14 0445 03/24/14 0500 03/24/14 0737  BP: 127/89 106/79  103/75  Pulse: 125 91    Temp: 98.1 F (36.7 C) 97.5 F (36.4 C)  97.5 F (36.4 C)  TempSrc: Oral Oral  Oral  Resp: 19 20  20   Height:      Weight:   123.3 kg (271 lb 13.2 oz)   SpO2: 96% 99%      Weight change: -4.8 kg (-10 lb 9.3 oz) Filed Weights   03/22/14 1400 03/23/14 0356 03/24/14 0500  Weight: 128.1 kg (282 lb 6.6 oz) 128.6 kg (283 lb 8.2 oz) 123.3 kg (271 lb 13.2 oz)   Body mass index is 37.93 kg/(m^2).   Gen Exam: Awake and alert with clear speech.  Rosacea in face Neck: Supple, No JVD.   Chest:B/L Clear this am CVS: S1 S2 irregular, no murmurs.  Abdomen: soft, BS +, non tender, non distended.  Extremities: 1+ edema, lower extremities warm to touch. Neurologic: Non Focal.   Skin: No Rash.   Wounds: N/A.   Intake/Output from previous day:  Intake/Output Summary (Last 24 hours) at 03/24/14 1012 Last data filed at 03/24/14 0520  Gross per 24 hour  Intake 1140.82 ml  Output   2000 ml  Net -859.18 ml     LAB RESULTS: CBC  Recent Labs Lab 03/21/14 2126 03/22/14 0758 03/23/14 0249  03/24/14 0334  WBC 11.7* 9.5 10.0 8.6  HGB 12.9 12.8 12.5 12.6  HCT 39.6 39.8 38.1 39.5  PLT 231 218 229 220  MCV 102.6* 103.1* 101.1* 101.0*  MCH 33.4 33.2 33.2 32.2  MCHC 32.6 32.2 32.8 31.9  RDW 16.2* 16.2* 16.0* 16.0*    Chemistries   Recent Labs Lab 03/21/14 2126 03/22/14 1312 03/23/14 0828 03/24/14 0334  NA 138 136* 138 138  K 5.0 3.7 3.9 3.7  CL 102 98 99 99  CO2 22 23 25 25   GLUCOSE 362* 296* 162* 233*  BUN 15 15 15 17   CREATININE 1.01 0.92 1.00 1.07    CALCIUM 8.8 9.1 9.4 9.3    CBG:  Recent Labs Lab 03/23/14 0830 03/23/14 1153 03/23/14 1548 03/23/14 2149 03/24/14 0737  GLUCAP 159* 164* 187* 151* 272*    GFR Estimated Creatinine Clearance: 78 mL/min (by C-G formula based on Cr of 1.07).  Coagulation profile  Recent Labs Lab 03/21/14 2222  INR 1.12    Cardiac Enzymes  Recent Labs Lab 03/22/14 0105 03/22/14 0758 03/22/14 1250  TROPONINI <0.30 <0.30 <0.30    Invalid input(s): POCBNP  Recent Labs  03/22/14 0105  DDIMER 1.38*    Recent Labs  03/22/14 0105  HGBA1C 8.6*    Recent Labs  03/23/14 0250  CHOL 104  HDL 36*  LDLCALC 49  TRIG 93  CHOLHDL 2.9    Recent Labs  03/22/14 0105  TSH 5.340*  T3FREE 3.0   No results for input(s): VITAMINB12, FOLATE, FERRITIN, TIBC, IRON, RETICCTPCT in the last 72 hours. No results for input(s): LIPASE, AMYLASE in the last 72 hours.  Urine Studies No results for input(s): UHGB, CRYS in the last 72 hours.  Invalid input(s): UACOL, UAPR, USPG, UPH, UTP, UGL, UKET, UBIL, UNIT, UROB, ULEU, UEPI, UWBC, URBC, UBAC, CAST, UCOM, BILUA  MICROBIOLOGY: Recent Results (from the past 240 hour(s))  MRSA PCR Screening     Status: None   Collection Time: 03/22/14  2:00 PM  Result Value Ref Range Status   MRSA by PCR NEGATIVE NEGATIVE Final    Comment:        The GeneXpert MRSA Assay (FDA approved for NASAL specimens only), is one component of a comprehensive MRSA colonization surveillance program. It is not intended to diagnose MRSA infection nor to guide or monitor treatment for MRSA infections.     RADIOLOGY STUDIES/RESULTS: Dg Chest 2 View  03/21/2014   CLINICAL DATA:  Shortness of breath. History of atrial fibrillation.  EXAM: CHEST  2 VIEW  COMPARISON:  None.  FINDINGS: Cardiac enlargement with mild pulmonary vascular congestion. Interstitial changes in the lungs likely due to edema. Pre-existing interstitial fibrosis could also have this appearance.  Blunting of the costophrenic angles suggesting small bilateral pleural effusions. No focal consolidation in the lungs. No pneumothorax. Tortuous aorta.  IMPRESSION: Cardiac enlargement with pulmonary vascular congestion and probable interstitial edema. Small effusions bilaterally.   Electronically Signed   By: Burman NievesWilliam  Stevens M.D.   On: 03/21/2014 22:10   Dg Chest Port 1 View  03/23/2014   CLINICAL DATA:  Congestive heart failure  EXAM: PORTABLE CHEST - 1 VIEW  COMPARISON:  Radiograph 03/21/2014  FINDINGS: Cardiac silhouette is enlarged similar prior. The pulmonary arteries are prominent. Mild central venous congestion is improved compared to prior. No pleural fluid. No pneumothorax.  IMPRESSION: Cardiomegaly and mild venous congestion. Improvement in interstitial edema pattern seen on comparison exam.   Electronically Signed   By: Genevive BiStewart  Edmunds  M.D.   On: 03/23/2014 18:04    Jeoffrey Massed, MD  [facility] Pager:[phone]  If 7PM-7AM, please contact night-coverage [email or web address] Password [email or web address] 03/24/2014, 10:12 AM   LOS: 3 days

## 2014-03-25 DIAGNOSIS — I4891 Unspecified atrial fibrillation: Secondary | ICD-10-CM

## 2014-03-25 DIAGNOSIS — M7989 Other specified soft tissue disorders: Secondary | ICD-10-CM

## 2014-03-25 DIAGNOSIS — R0602 Shortness of breath: Secondary | ICD-10-CM

## 2014-03-25 LAB — BASIC METABOLIC PANEL
Anion gap: 15 (ref 5–15)
BUN: 20 mg/dL (ref 6–23)
CO2: 25 mEq/L (ref 19–32)
Calcium: 9.2 mg/dL (ref 8.4–10.5)
Chloride: 99 mEq/L (ref 96–112)
Creatinine, Ser: 1 mg/dL (ref 0.50–1.10)
GFR calc Af Amer: 68 mL/min — ABNORMAL LOW (ref >90)
GFR calc non Af Amer: 59 mL/min — ABNORMAL LOW (ref >90)
Glucose, Bld: 143 mg/dL — ABNORMAL HIGH (ref 70–99)
Potassium: 3.3 mEq/L — ABNORMAL LOW (ref 3.7–5.3)
Sodium: 139 mEq/L (ref 137–147)

## 2014-03-25 LAB — MAGNESIUM: Magnesium: 2 mg/dL (ref 1.5–2.5)

## 2014-03-25 LAB — GLUCOSE, CAPILLARY
Glucose-Capillary: 87 mg/dL (ref 70–99)
Glucose-Capillary: 74 mg/dL (ref 70–99)

## 2014-03-25 MED ORDER — APIXABAN 5 MG PO TABS: 5.0000 mg | ORAL_TABLET | Freq: Two times a day (BID) | ORAL | Status: DC

## 2014-03-25 MED ORDER — DIGOXIN 125 MCG PO TABS
0.1250 mg | ORAL_TABLET | Freq: Every day | ORAL | Status: DC
  Administered 2014-03-25: 0.125 mg via ORAL
  Filled 2014-03-25: qty 1

## 2014-03-25 MED ORDER — METOPROLOL SUCCINATE ER 50 MG PO TB24: 50.0000 mg | ORAL_TABLET | Freq: Two times a day (BID) | ORAL | Status: DC

## 2014-03-25 MED ORDER — GLYBURIDE 5 MG PO TABS: 5.0000 mg | ORAL_TABLET | Freq: Every day | ORAL | Status: DC

## 2014-03-25 MED ORDER — FUROSEMIDE 40 MG PO TABS
40.0000 mg | ORAL_TABLET | Freq: Every day | ORAL | Status: DC
  Administered 2014-03-25: 40 mg via ORAL
  Filled 2014-03-25: qty 1

## 2014-03-25 MED ORDER — POTASSIUM CHLORIDE 10 MEQ/100ML IV SOLN
10.0000 meq | INTRAVENOUS | Status: DC
  Administered 2014-03-25: 10 meq via INTRAVENOUS
  Filled 2014-03-25 (×2): qty 100

## 2014-03-25 MED ORDER — FUROSEMIDE 40 MG PO TABS: 40.0000 mg | ORAL_TABLET | Freq: Every day | ORAL | Status: DC

## 2014-03-25 MED ORDER — LISINOPRIL 5 MG PO TABS: 5.0000 mg | ORAL_TABLET | Freq: Every day | ORAL | Status: DC

## 2014-03-25 MED ORDER — POTASSIUM CHLORIDE CRYS ER 20 MEQ PO TBCR: 20.0000 meq | EXTENDED_RELEASE_TABLET | Freq: Every day | ORAL | Status: DC

## 2014-03-25 MED ORDER — DIGOXIN 125 MCG PO TABS: 0.1250 mg | ORAL_TABLET | Freq: Every day | ORAL | Status: DC

## 2014-03-25 MED ORDER — METOPROLOL SUCCINATE ER 50 MG PO TB24
50.0000 mg | ORAL_TABLET | Freq: Two times a day (BID) | ORAL | Status: DC
  Administered 2014-03-25: 50 mg via ORAL
  Filled 2014-03-25 (×2): qty 1

## 2014-03-25 MED ORDER — LISINOPRIL 5 MG PO TABS
5.0000 mg | ORAL_TABLET | Freq: Every day | ORAL | Status: DC
  Administered 2014-03-25: 5 mg via ORAL
  Filled 2014-03-25: qty 1

## 2014-03-25 MED ORDER — POTASSIUM CHLORIDE CRYS ER 20 MEQ PO TBCR: 40.0000 meq | EXTENDED_RELEASE_TABLET | Freq: Once | ORAL | Status: DC

## 2014-03-25 MED ORDER — POTASSIUM CHLORIDE CRYS ER 20 MEQ PO TBCR
40.0000 meq | EXTENDED_RELEASE_TABLET | Freq: Two times a day (BID) | ORAL | Status: DC
  Administered 2014-03-25: 40 meq via ORAL
  Filled 2014-03-25: qty 2

## 2014-03-25 MED ORDER — SALINE SPRAY 0.65 % NA SOLN
1.0000 | NASAL | Status: DC | PRN
  Filled 2014-03-25: qty 44

## 2014-03-25 NOTE — Clinical Social Work Note (Signed)
CSW Consult Acknowledged:   CSW met the patient at the bedside to discuss transportation consult. CSW introduced self and purpose of visit. Pt provided CSW with an updated home address of [place] completed a taxi voucher for the pt. Transport has been arranged. CSW has signed off.   [place], MSW, [place]

## 2014-03-25 NOTE — Discharge Summary (Signed)
PATIENT DETAILS Name: Kaitlyn [name] Age: 63 y.o. Sex: female Date of Birth: 1951/01/11 MRN: [id number]. Admitting Physician: Eduard Clos, MD PCP:No PCP Per Patient  Admit Date: 03/21/2014 Discharge date: 03/25/2014  Recommendations for Outpatient Follow-up:  1. Check BMET in 1 week 2. Will need further workup for cardiomyopathy as an outpatient.  PRIMARY DISCHARGE DIAGNOSIS:  Active Problems:   Atrial flutter with rapid ventricular response   Diabetes mellitus type 2, uncontrolled   Acute on chronic systolic congestive heart failure   Obesity (BMI 30-39.9)   H/O noncompliance with medical treatment, presenting hazards to health      PAST MEDICAL HISTORY: Past Medical History  Diagnosis Date  . CHF (congestive heart failure)   . Atrial fibrillation   . Diabetes mellitus without complication     no insuline dependent  . Seizures     DISCHARGE MEDICATIONS: Current Discharge Medication List    START taking these medications   Details  apixaban (ELIQUIS) 5 MG TABS tablet Take 1 tablet (5 mg total) by mouth 2 (two) times daily. Qty: 60 tablet, Refills: 0    digoxin (LANOXIN) 0.125 MG tablet Take 1 tablet (0.125 mg total) by mouth daily. Qty: 30 tablet, Refills: 0    glyBURIDE (DIABETA) 5 MG tablet Take 1 tablet (5 mg total) by mouth daily with breakfast. Qty: 30 tablet, Refills: 0    lisinopril (PRINIVIL,ZESTRIL) 5 MG tablet Take 1 tablet (5 mg total) by mouth daily. Qty: 30 tablet, Refills: 0    metoprolol succinate (TOPROL-XL) 50 MG 24 hr tablet Take 1 tablet (50 mg total) by mouth 2 (two) times daily. Take with or immediately following a meal. Qty: 60 tablet, Refills: 0    potassium chloride SA (K-DUR,KLOR-CON) 20 MEQ tablet Take 1 tablet (20 mEq total) by mouth daily. Qty: 30 tablet, Refills: 0      CONTINUE these medications which have CHANGED   Details  furosemide (LASIX) 40 MG tablet Take 1 tablet (40 mg total) by mouth daily. Qty: 30 tablet,  Refills: 0        ALLERGIES:   Allergies  Allergen Reactions  . Caffeine Other (See Comments)    Seizure  . Penicillins Other (See Comments)    Childhood allery    BRIEF HPI:  See H&P, Labs, Consult and Test reports for all details in brief, patient is a 63 y.o. female with history of diabetes mellitus type 2, CHF, and atrial fibrillation was brought to the emergency room for shortness of breath. Further evaluation revealed acute decompensated CHF and atrial fibrillation with RVR.  CONSULTATIONS:   cardiology  PERTINENT RADIOLOGIC STUDIES: Dg Chest 2 View  03/21/2014   CLINICAL DATA:  Shortness of breath. History of atrial fibrillation.  EXAM: CHEST  2 VIEW  COMPARISON:  None.  FINDINGS: Cardiac enlargement with mild pulmonary vascular congestion. Interstitial changes in the lungs likely due to edema. Pre-existing interstitial fibrosis could also have this appearance. Blunting of the costophrenic angles suggesting small bilateral pleural effusions. No focal consolidation in the lungs. No pneumothorax. Tortuous aorta.  IMPRESSION: Cardiac enlargement with pulmonary vascular congestion and probable interstitial edema. Small effusions bilaterally.   Electronically Signed   By: Burman Nieves M.D.   On: 03/21/2014 22:10   Dg Chest Port 1 View  03/23/2014   CLINICAL DATA:  Congestive heart failure  EXAM: PORTABLE CHEST - 1 VIEW  COMPARISON:  Radiograph 03/21/2014  FINDINGS: Cardiac silhouette is enlarged similar prior. The pulmonary arteries are prominent. Mild central  venous congestion is improved compared to prior. No pleural fluid. No pneumothorax.  IMPRESSION: Cardiomegaly and mild venous congestion. Improvement in interstitial edema pattern seen on comparison exam.   Electronically Signed   By: Genevive Bi M.D.   On: 03/23/2014 18:04     PERTINENT LAB RESULTS: CBC:  Recent Labs  03/23/14 0249 03/24/14 0334  WBC 10.0 8.6  HGB 12.5 12.6  HCT 38.1 39.5  PLT 229 220    CMET CMP     Component Value Date/Time   NA 139 03/25/2014 0220   K 3.3* 03/25/2014 0220   CL 99 03/25/2014 0220   CO2 25 03/25/2014 0220   GLUCOSE 143* 03/25/2014 0220   BUN 20 03/25/2014 0220   CREATININE 1.00 03/25/2014 0220   CALCIUM 9.2 03/25/2014 0220   PROT 6.9 03/22/2014 1312   ALBUMIN 3.4* 03/22/2014 1312   AST 17 03/22/2014 1312   ALT 15 03/22/2014 1312   ALKPHOS 47 03/22/2014 1312   BILITOT 0.7 03/22/2014 1312   GFRNONAA 59* 03/25/2014 0220   GFRAA 68* 03/25/2014 0220    GFR Estimated Creatinine Clearance: 83.4 mL/min (by C-G formula based on Cr of 1). No results for input(s): LIPASE, AMYLASE in the last 72 hours.  Recent Labs  03/22/14 1250  TROPONINI <0.30   Invalid input(s): POCBNP No results for input(s): DDIMER in the last 72 hours. No results for input(s): HGBA1C in the last 72 hours.  Recent Labs  03/23/14 0250  CHOL 104  HDL 36*  LDLCALC 49  TRIG 93  CHOLHDL 2.9   No results for input(s): TSH, T4TOTAL, T3FREE, THYROIDAB in the last 72 hours.  Invalid input(s): FREET3 No results for input(s): VITAMINB12, FOLATE, FERRITIN, TIBC, IRON, RETICCTPCT in the last 72 hours. Coags: No results for input(s): INR in the last 72 hours.  Invalid input(s): PT Microbiology: Recent Results (from the past 240 hour(s))  MRSA PCR Screening     Status: None   Collection Time: 03/22/14  2:00 PM  Result Value Ref Range Status   MRSA by PCR NEGATIVE NEGATIVE Final    Comment:        The GeneXpert MRSA Assay (FDA approved for NASAL specimens only), is one component of a comprehensive MRSA colonization surveillance program. It is not intended to diagnose MRSA infection nor to guide or monitor treatment for MRSA infections.      BRIEF HOSPITAL COURSE:   Active Problems:  Acute Systolic Heart Failure:patient was admitted, started on IV Lasix. Echocardiogram revealed EF around 15-20%, beta blocker was added to her regimen. She has improved  significantly compared to admission,and is much more compensated. Her weight is down to 271 pounds from 285 pounds on admission.Cardiology was consulted during this hospital stay,and assisted in management of this patient. Since significantly improved, and is requesting discharge, patient is being discharged at her own request. A follow-up appointment at the CHF clinic has been made for the patient. Case management has been consulted to assist and outpatient follow-up and medication management. Patient has been repeatedly counseled regarding compliance to medications and to follow-up. We have added ACE inhibitor to her discharge medications. Suspect decompensation secondary to Afib/AFultter with RVR/non compliance to medications.  Active Problems:  Atrial flutter with rapid ventricular response:admitted and started on IV Cardizem infusion, Heparin gtt.Once Heart rate was better,slowly weaned off Cardizem gtt and transition to oral metoprolol and Digoxin. Her heart rate is significantly better, but she remains in atrial fibrillation.We have stopped heparin infusion and started on liquids.Case  Management consulted to help with Eliquis assistance.  Cardiomyopathy:?etiology-claims never had a cardiac cath in the past, could also be rate related cardiomyopathy. Defer work up to cards, who is planning on outpatient work up as patient is insisting on discharge today   Diabetes mellitus type 2, uncontrolled:A1c 8.6, for now continue with Glyburide. CBG's were controlled with SSI while inpatient.    Morbid Obesity:counseled regarding weight loss   History of seizures: off medications for last 30 years.   Non Compliance to medications:counseled repeatedly  TODAY-DAY OF DISCHARGE:  Subjective:   Kaitlyn [name] today has no headache,no chest abdominal pain,no new weakness tingling or numbness, feels much better wants to go home today.   Objective:   Blood pressure 102/79, pulse 95, temperature 98.1  F (36.7 C), temperature source Oral, resp. rate 19, height 5\' 11"  (1.803 m), weight 123.3 kg (271 lb 13.2 oz), SpO2 91 %.  Intake/Output Summary (Last 24 hours) at 03/25/14 0912 Last data filed at 03/25/14 0600  Gross per 24 hour  Intake    556 ml  Output   3600 ml  Net  -3044 ml   Filed Weights   03/22/14 1400 03/23/14 0356 03/24/14 0500  Weight: 128.1 kg (282 lb 6.6 oz) 128.6 kg (283 lb 8.2 oz) 123.3 kg (271 lb 13.2 oz)    Exam Awake Alert, Oriented *3, No new F.N deficits, Normal affect [place].AT,PERRAL Supple Neck,No JVD, No cervical lymphadenopathy appriciated.  Symmetrical Chest wall movement, Good air movement bilaterally, CTAB RRR,No Gallops,Rubs or new Murmurs, No Parasternal Heave +ve B.Sounds, Abd Soft, Non tender, No organomegaly appriciated, No rebound -guarding or rigidity. No Cyanosis, Clubbing, No new Rash or bruise.1+ edema  DISCHARGE CONDITION: Stable  DISPOSITION: Home  DISCHARGE INSTRUCTIONS:    Activity:  As tolerated   Diet recommendation: Diabetic Diet Heart Healthy diet  Discharge Instructions    (HEART FAILURE PATIENTS) Call MD:  Anytime you have any of the following symptoms: 1) 3 pound weight gain in 24 hours or 5 pounds in 1 week 2) shortness of breath, with or without a dry hacking cough 3) swelling in the hands, feet or stomach 4) if you have to sleep on extra pillows at night in order to breathe.    Complete by:  As directed      ACE Inhibitor / ARB already ordered    Complete by:  As directed      Diet - low sodium heart healthy    Complete by:  As directed      Heart Failure patients record your daily weight using the same scale at the same time of day    Complete by:  As directed      Increase activity slowly    Complete by:  As directed      STOP any activity that causes chest pain, shortness of breath, dizziness, sweating, or exessive weakness    Complete by:  As directed            Follow-up Information    Follow up with  Aundria Rudosgrove, Ali B, NP On 04/01/2014.   Specialty:  Nurse Practitioner   Why:  at 11:45 am in the [facility]--Gate code 5000--bring medications   Contact information:   [address] [place] [address] [phone]      Total Time spent on discharge equals 45 minutes.  SignedJeoffrey Massed: Brasen Bundren 03/25/2014 9:12 AM

## 2014-03-25 NOTE — Progress Notes (Addendum)
Pt d/c home per MD order, pt VSS, pt tol well, pt d/c instructions given and meds given, all questions answered, pt verbalized understanding of d/c

## 2014-03-25 NOTE — Progress Notes (Signed)
Heart Failure Navigator Consult Note  Presentation: Kaitlyn [name] is a 63 y.o. female with history of diabetes mellitus type 2 and atrial fibrillation and previous history of seizures present on no medications presented to the ER because of shortness of breath. Patient said that she suddenly started getting short of breath with palpitations last afternoon. Patient has just recently moved from [place] last week and has ran out of her medications and was only taking her Lasix. Patient states he usually is on digoxin, carvedilol and Lasix along with glyburide. In the ER patient was found to be in atrial flutter with RVR and chest x-ray was showing congestion. Patient was initially given Cardizem by EMS following which patient's heart rate improved from 180 to 120s and in the ER patient was started on Cardizem infusion after initial metoprolol IV was given. On call cardiologist Dr. Mayford [name] has been consulted and patient has been admitted for further management of her CHF and atrial flutter with RVR. Patient states that she has not been taking any anticoagulants now but she did take once in 2012. Patient denies any fever chills chest pain nausea vomiting abdominal pain headache focal deficits.     Past Medical History  Diagnosis Date  . CHF (congestive heart failure)   . Atrial fibrillation   . Diabetes mellitus without complication     no insuline dependent  . Seizures     History   Social History  . Marital Status: Single    Spouse Name: N/A    Number of Children: N/A  . Years of Education: N/A   Social History Main Topics  . Smoking status: Never Smoker   . Smokeless tobacco: Never Used  . Alcohol Use: No  . Drug Use: No  . Sexual Activity: None   Other Topics Concern  . None   Social History Narrative    ECHO:Study Conclusions--03/22/14  - Left ventricle: The cavity size was severely dilated. Systolic function was severely reduced. The estimated ejection fraction was in  the range of 15% to 20%. Severe diffuse hypokinesis. Regional wall motion abnormalities cannot be excluded. - Aortic valve: There was severe regurgitation. - Left atrium: The atrium was severely dilated. - Right ventricle: The cavity size was dilated. Wall thickness was normal. - Right atrium: The atrium was moderately to severely dilated.  Transthoracic echocardiography. M-mode, complete 2D, spectral Doppler, and color Doppler. Birthdate: Patient birthdate: [birth year]. Age: Patient is 63 yr old. Sex: Gender: female. BMI: 39.7 kg/m^2. Blood pressure:   109/55 Patient status: Inpatient. Study date: Study date: 03/22/2014. Study time: 02:19 PM. Location: ICU/CCU  BNP    Component Value Date/Time   PROBNP 3181.0* 03/22/2014 1312    Education Assessment and Provision:  Detailed education and instructions provided on heart failure disease management including the following:  Signs and symptoms of Heart Failure When to call the physician Importance of daily weights Low sodium diet Fluid restriction Medication management Anticipated future follow-up appointments  Patient education given on each of the above topics.  Patient acknowledges understanding and acceptance of all instructions.  I spoke at length with Kaitlyn [name] regarding her HF.  She just moved to [place] 2 weeks ago from [place].  Her home in [place] was foreclosed and she left most of her belongings.  She says that she has had HF for over 4 years and has received education about HF before.  She has not had a cardiologist and HF in [place] was managed by an "internist".  She admits that  she did not weigh at home because "she does not want to see the number" and "could not find her scale" in old home in [place].  I reinforced the importance of daily weights as a tool for fluid retention. She also admits to eating canned foods including soups and [employer] canned pasta.  I reviewed high sodium foods to avoid and a low sodium diet.   She will need ongoing support for education as well as compliance reinforcement.  She is currently not driving and is using a cab for transportation however says that she will be able to make follow-up appt.  I will provide her with a scale for daily weights.   Education Materials:  "Living Better With Heart Failure" Booklet, Daily Weight Tracker Tool   High Risk Criteria for Readmission and/or Poor Patient Outcomes:  (Recommend Follow-up with [facility] Clinic)--Yes  EF <30%- Yes 15-20%  2 or more admissions in 6 months- No --recently moved  Difficult social situation- --lives alone, new to [place], little local support  Demonstrates medication noncompliance- Yes--has previously ran out of medications and stopped taking causing admit to hospital.   Barriers of Care:  Knowledge, compliance, finances, social situation  Discharge Planning:  Plans to discharge to home alone.  She has a follow-up appt at the AHF clinic on 04/01/14 at 11:45a.    I also referred her to our outpatient SW for assistance with finding a PCP as well as getting food stamps (SNAP) and other resources she may qualify for.

## 2014-03-25 NOTE — Progress Notes (Signed)
Patient ID: Kaitlyn [name], female   DOB: May 20, 1950, 63 y.o.   MRN: [id number]   SUBJECTIVE: Patient wants to go home today.  Breathing better, able to walk in halls without dyspnea.  She was admitted with DOE and atrial fibrillation/RVR after running out of all meds about 1 month ago.  By her history, she has a cardiomyopathy and defibrillator had been discussed in the past.  She has also had atrial fibrillation for years, but it is unclear to me whether it has been paroxysmal or persistent.  She has never had a cardiac cath that she can remember.  It does sound like that at one point she had a pericardiocentesis.   She diuresed well overnight with IV Lasix.   Scheduled Meds: . apixaban  5 mg Oral BID  . digoxin  0.125 mg Oral Daily  . furosemide  40 mg Oral Daily  . glyBURIDE  5 mg Oral Q breakfast  . insulin aspart  0-9 Units Subcutaneous TID WC  . lisinopril  5 mg Oral Daily  . metoprolol succinate  50 mg Oral BID  . potassium chloride  40 mEq Oral BID  . sodium chloride  3 mL Intravenous Q12H   Continuous Infusions:  PRN Meds:.acetaminophen **OR** acetaminophen, metoprolol, ondansetron **OR** ondansetron (ZOFRAN) IV, sodium chloride    Filed Vitals:   03/24/14 2142 03/25/14 0000 03/25/14 0400 03/25/14 0700  BP: 127/70 103/56 107/58   Pulse: 92     Temp: 98.6 F (37 C) 98.1 F (36.7 C)  98.1 F (36.7 C)  TempSrc: Oral Oral  Oral  Resp: 23 19    Height:      Weight:      SpO2: 97% 96%      Intake/Output Summary (Last 24 hours) at 03/25/14 0832 Last data filed at 03/25/14 0600  Gross per 24 hour  Intake    818 ml  Output   3600 ml  Net  -2782 ml    LABS: Basic Metabolic Panel:  Recent Labs  49/20/10 0334 03/25/14 0220  NA 138 139  K 3.7 3.3*  CL 99 99  CO2 25 25  GLUCOSE 233* 143*  BUN 17 20  CREATININE 1.07 1.00  CALCIUM 9.3 9.2  MG  --  2.0   Liver Function Tests:  Recent Labs  03/22/14 1312  AST 17  ALT 15  ALKPHOS 47  BILITOT 0.7  PROT 6.9   ALBUMIN 3.4*   No results for input(s): LIPASE, AMYLASE in the last 72 hours. CBC:  Recent Labs  03/23/14 0249 03/24/14 0334  WBC 10.0 8.6  HGB 12.5 12.6  HCT 38.1 39.5  MCV 101.1* 101.0*  PLT 229 220   Cardiac Enzymes:  Recent Labs  03/22/14 1250  TROPONINI <0.30   BNP: Invalid input(s): POCBNP D-Dimer: No results for input(s): DDIMER in the last 72 hours. Hemoglobin A1C: No results for input(s): HGBA1C in the last 72 hours. Fasting Lipid Panel:  Recent Labs  03/23/14 0250  CHOL 104  HDL 36*  LDLCALC 49  TRIG 93  CHOLHDL 2.9   Thyroid Function Tests: No results for input(s): TSH, T4TOTAL, T3FREE, THYROIDAB in the last 72 hours.  Invalid input(s): FREET3 Anemia Panel: No results for input(s): VITAMINB12, FOLATE, FERRITIN, TIBC, IRON, RETICCTPCT in the last 72 hours.  RADIOLOGY: Dg Chest 2 View  03/21/2014   CLINICAL DATA:  Shortness of breath. History of atrial fibrillation.  EXAM: CHEST  2 VIEW  COMPARISON:  None.  FINDINGS: Cardiac enlargement with  mild pulmonary vascular congestion. Interstitial changes in the lungs likely due to edema. Pre-existing interstitial fibrosis could also have this appearance. Blunting of the costophrenic angles suggesting small bilateral pleural effusions. No focal consolidation in the lungs. No pneumothorax. Tortuous aorta.  IMPRESSION: Cardiac enlargement with pulmonary vascular congestion and probable interstitial edema. Small effusions bilaterally.   Electronically Signed   By: Burman Nieves M.D.   On: 03/21/2014 22:10   Dg Chest Port 1 View  03/23/2014   CLINICAL DATA:  Congestive heart failure  EXAM: PORTABLE CHEST - 1 VIEW  COMPARISON:  Radiograph 03/21/2014  FINDINGS: Cardiac silhouette is enlarged similar prior. The pulmonary arteries are prominent. Mild central venous congestion is improved compared to prior. No pleural fluid. No pneumothorax.  IMPRESSION: Cardiomegaly and mild venous congestion. Improvement in  interstitial edema pattern seen on comparison exam.   Electronically Signed   By: Genevive Bi M.D.   On: 03/23/2014 18:04    PHYSICAL EXAM General: NAD Neck: No JVD, no thyromegaly or thyroid nodule.  Lungs: Clear to auscultation bilaterally with normal respiratory effort. CV: Nondisplaced PMI.  Heart mildly tachy, irregular S1/S2, no S3/S4, no murmur.  1+ ankle edema.  No carotid bruit.  Abdomen: Soft, nontender, no hepatosplenomegaly, no distention.  Neurologic: Alert and oriented x 3.  Psych: Normal affect. Extremities: No clubbing or cyanosis.   TELEMETRY: Reviewed telemetry pt in atrial fibrillation, rate 100s  ASSESSMENT AND PLAN: 63 yo with history of atrial fibrillation, CHF, diabetes, and seizure disorder presented to [facility] ER with atrial fibrillation/RVR and acute on chronic systolic CHF.  1. Acute on chronic systolic CHF: Uncertain etiology.  Apparently has had weak heart x years.  Sounds like she had periodic admissions to a hospital in [place] where should be get her medications, but she did not have any physician that she regularly saw as an outpatient.  Echo here shows severe LV dilation with EF 15-20%.  It does not sound like she's ever had a heart cath to rule out CAD.  Cannot rule out tachy-mediated cardiomyopathy either.   HR remains elevated in atrial fibrillation. On exam, she looks near euvolemic and is feeling better.  - May change to po Lasix, 40 mg daily.  - Change metoprolol short-acting to Toprol XL 50 mg bid.  - Decrease digoxin to 0.125 daily.  - Add lisinopril 5 mg daily.  - Will likely need ICD down the road if EF remains low with aggressive medical treatment.  Would not be CRT candidate with narrow QRS.  - Ideally should have right/left heart cath to rule out CAD, will consider this as outpatient.  - Will need to consider DCCV to try to get her back in NSR.  2. Atrial fibrillation: With RVR at admission. She has had atrial fibrillation in the past, but I  am not sure if this has been paroxysmal or chronic.  Cannot rule out tachy-mediated cardiomyopathy.  She is now on Eliquis.  We need to try to get her records from the hospital in [place].  If her atrial fibrillation seems to be paroxysmal, I would recommend that she start amiodarone and we attempt DCCV on her eventually.  She does not want to stay in hospital any longer for this.  3. Diabetes: Will need PCP followup.  4. Disposition: Patient is not willing to stay in the hospital any longer.  She will be going home today.  Will arrange followup in CHF clinic in 1 week.  We will need to  get her records from the hospital in [place] where she was admitted 2 times earlier this year.  She has Medicaid and should be able to get her medications.  When we get her prior records, will need to decide on cardioversion attempt and on cardiac catheterization.  Home cardiac med regimen: Eliquis 5 mg bid, Toprol XL 50 mg bid, lisinopril 5 mg daily, Lasix 40 mg daily, KCl 20 mEq daily, digoxin 0.125 mg daily.   Marca AnconaDalton Kaitlyn [name] 03/25/2014 8:46 AM

## 2014-03-25 NOTE — Progress Notes (Signed)
VASCULAR LAB PRELIMINARY  PRELIMINARY  PRELIMINARY  PRELIMINARY  Bilateral lower extremity venous Dopplers completed.    Preliminary report:  There is no obvious evidence of DVT or SVT noted in the bilateral lower extremities.   Kaitlyn [name], RVT 03/25/2014, 10:04 AM

## 2014-03-27 ENCOUNTER — Inpatient Hospital Stay: Payer: 59

## 2014-04-01 ENCOUNTER — Inpatient Hospital Stay (HOSPITAL_COMMUNITY): Admit: 2014-04-01 | Payer: 59

## 2014-04-10 ENCOUNTER — Inpatient Hospital Stay (HOSPITAL_COMMUNITY): Payer: 59

## 2014-04-15 ENCOUNTER — Telehealth: Payer: Self-pay | Admitting: Licensed Clinical Social Worker

## 2014-04-15 NOTE — Telephone Encounter (Signed)
CSW referred to assist patient with obtaining PCP. Patient reports she recently moved from [place] and had [employer] and Social Security Disability. Patient states she is not eligible for Medicare until December 2016 when she turns 64. Patient moved her due to foreclosure on her home and lack of finances and family support in [place]. Patient reports she was able to obtain affordable housing in [place] and has cousins in the local area. Patient has not changed/transferred benefits to [place] from [place] yet. CSW encouraged patient to contact Social Security to change her address and then would be able to apply for  medicaid. Patient verbalizes understanding of follow up needed and states she will contact CSW to follow up with PCP after contacting SS . CSW available as needed. Lasandra Beech, LCSW [phone]

## 2014-04-27 ENCOUNTER — Inpatient Hospital Stay (HOSPITAL_COMMUNITY)
Admission: EM | Admit: 2014-04-27 | Discharge: 2014-04-28 | DRG: 292 | Disposition: A | Discharge status: Home / Self Care | Payer: 59 | Attending: Internal Medicine | Admitting: Internal Medicine

## 2014-04-27 ENCOUNTER — Encounter (HOSPITAL_COMMUNITY): Payer: Self-pay | Admitting: Emergency Medicine

## 2014-04-27 ENCOUNTER — Emergency Department (HOSPITAL_COMMUNITY): Payer: 59

## 2014-04-27 DIAGNOSIS — E1169 Type 2 diabetes mellitus with other specified complication: Secondary | ICD-10-CM | POA: Diagnosis present

## 2014-04-27 DIAGNOSIS — Z880 Allergy status to penicillin: Secondary | ICD-10-CM

## 2014-04-27 DIAGNOSIS — I509 Heart failure, unspecified: Secondary | ICD-10-CM

## 2014-04-27 DIAGNOSIS — I482 Chronic atrial fibrillation, unspecified: Secondary | ICD-10-CM

## 2014-04-27 DIAGNOSIS — R0602 Shortness of breath: Secondary | ICD-10-CM | POA: Diagnosis not present

## 2014-04-27 DIAGNOSIS — Z9119 Patient's noncompliance with other medical treatment and regimen: Secondary | ICD-10-CM

## 2014-04-27 DIAGNOSIS — R569 Unspecified convulsions: Secondary | ICD-10-CM | POA: Diagnosis present

## 2014-04-27 DIAGNOSIS — Z888 Allergy status to other drugs, medicaments and biological substances status: Secondary | ICD-10-CM

## 2014-04-27 DIAGNOSIS — I5022 Chronic systolic (congestive) heart failure: Secondary | ICD-10-CM

## 2014-04-27 DIAGNOSIS — I4892 Unspecified atrial flutter: Secondary | ICD-10-CM

## 2014-04-27 DIAGNOSIS — Z91199 Patient's noncompliance with other medical treatment and regimen due to unspecified reason: Secondary | ICD-10-CM

## 2014-04-27 DIAGNOSIS — I4891 Unspecified atrial fibrillation: Secondary | ICD-10-CM

## 2014-04-27 DIAGNOSIS — I5023 Acute on chronic systolic (congestive) heart failure: Principal | ICD-10-CM | POA: Diagnosis present

## 2014-04-27 DIAGNOSIS — Z833 Family history of diabetes mellitus: Secondary | ICD-10-CM | POA: Diagnosis not present

## 2014-04-27 DIAGNOSIS — IMO0002 Reserved for concepts with insufficient information to code with codable children: Secondary | ICD-10-CM

## 2014-04-27 DIAGNOSIS — Z7901 Long term (current) use of anticoagulants: Secondary | ICD-10-CM

## 2014-04-27 DIAGNOSIS — E669 Obesity, unspecified: Secondary | ICD-10-CM

## 2014-04-27 DIAGNOSIS — E1165 Type 2 diabetes mellitus with hyperglycemia: Secondary | ICD-10-CM

## 2014-04-27 LAB — CBC WITH DIFFERENTIAL/PLATELET
Basophils Absolute: 0.1 10*3/uL (ref 0.0–0.1)
Basophils Relative: 1 % (ref 0–1)
Eosinophils Absolute: 1.5 10*3/uL — ABNORMAL HIGH (ref 0.0–0.7)
Eosinophils Relative: 11 % — ABNORMAL HIGH (ref 0–5)
HCT: 38 % (ref 36.0–46.0)
Hemoglobin: 12 g/dL (ref 12.0–15.0)
Lymphocytes Relative: 13 % (ref 12–46)
Lymphs Abs: 1.8 10*3/uL (ref 0.7–4.0)
MCH: 31.9 pg (ref 26.0–34.0)
MCHC: 31.6 g/dL (ref 30.0–36.0)
MCV: 101.1 fL — ABNORMAL HIGH (ref 78.0–100.0)
Monocytes Absolute: 0.7 10*3/uL (ref 0.1–1.0)
Monocytes Relative: 5 % (ref 3–12)
Neutro Abs: 9.8 10*3/uL — ABNORMAL HIGH (ref 1.7–7.7)
Neutrophils Relative %: 70 % (ref 43–77)
Platelets: 250 10*3/uL (ref 150–400)
RBC: 3.76 MIL/uL — ABNORMAL LOW (ref 3.87–5.11)
RDW: 15.9 % — ABNORMAL HIGH (ref 11.5–15.5)
WBC: 13.9 10*3/uL — ABNORMAL HIGH (ref 4.0–10.5)

## 2014-04-27 LAB — COMPREHENSIVE METABOLIC PANEL
ALT: 12 U/L (ref 0–35)
AST: 14 U/L (ref 0–37)
Albumin: 3.3 g/dL — ABNORMAL LOW (ref 3.5–5.2)
Alkaline Phosphatase: 46 U/L (ref 39–117)
Anion gap: 15 (ref 5–15)
BUN: 15 mg/dL (ref 6–23)
CO2: 20 mEq/L (ref 19–32)
Calcium: 8.7 mg/dL (ref 8.4–10.5)
Chloride: 104 mEq/L (ref 96–112)
Creatinine, Ser: 0.92 mg/dL (ref 0.50–1.10)
GFR calc Af Amer: 75 mL/min — ABNORMAL LOW (ref >90)
GFR calc non Af Amer: 65 mL/min — ABNORMAL LOW (ref >90)
Glucose, Bld: 300 mg/dL — ABNORMAL HIGH (ref 70–99)
Potassium: 4.2 mEq/L (ref 3.7–5.3)
Sodium: 139 mEq/L (ref 137–147)
Total Bilirubin: 0.6 mg/dL (ref 0.3–1.2)
Total Protein: 6.7 g/dL (ref 6.0–8.3)

## 2014-04-27 LAB — GLUCOSE, CAPILLARY
Glucose-Capillary: 260 mg/dL — ABNORMAL HIGH (ref 70–99)
Glucose-Capillary: 245 mg/dL — ABNORMAL HIGH (ref 70–99)
Glucose-Capillary: 206 mg/dL — ABNORMAL HIGH (ref 70–99)
Glucose-Capillary: 79 mg/dL (ref 70–99)
Glucose-Capillary: 130 mg/dL — ABNORMAL HIGH (ref 70–99)

## 2014-04-27 LAB — PRO B NATRIURETIC PEPTIDE: Pro B Natriuretic peptide (BNP): 5351 pg/mL — ABNORMAL HIGH (ref 0–125)

## 2014-04-27 LAB — MRSA PCR SCREENING: MRSA by PCR: NEGATIVE

## 2014-04-27 LAB — TROPONIN I: Troponin I: <0.3 ng/mL (ref <0.30)

## 2014-04-27 LAB — PROTIME-INR
INR: 1.51 — ABNORMAL HIGH (ref 0.00–1.49)
Prothrombin Time: 18.4 seconds — ABNORMAL HIGH (ref 11.6–15.2)

## 2014-04-27 MED ORDER — DILTIAZEM HCL 100 MG IV SOLR
5.0000 mg/h | INTRAVENOUS | Status: DC
  Administered 2014-04-27: 5 mg/h via INTRAVENOUS
  Administered 2014-04-27: 10 mg/h via INTRAVENOUS

## 2014-04-27 MED ORDER — SODIUM CHLORIDE 0.9 % IV SOLN: 250.0000 mL | INTRAVENOUS | Status: DC | PRN

## 2014-04-27 MED ORDER — DIGOXIN 125 MCG PO TABS
0.1250 mg | ORAL_TABLET | Freq: Every day | ORAL | Status: DC
  Administered 2014-04-27 – 2014-04-28 (×2): 0.125 mg via ORAL
  Filled 2014-04-27 (×2): qty 1

## 2014-04-27 MED ORDER — CETYLPYRIDINIUM CHLORIDE 0.05 % MT LIQD
7.0000 mL | Freq: Two times a day (BID) | OROMUCOSAL | Status: DC
  Administered 2014-04-27 – 2014-04-28 (×3): 7 mL via OROMUCOSAL

## 2014-04-27 MED ORDER — DIGOXIN 125 MCG PO TABS: 0.1250 mg | ORAL_TABLET | Freq: Every day | ORAL | Status: DC

## 2014-04-27 MED ORDER — NAPROXEN SODIUM 220 MG PO TABS: 220.0000 mg | ORAL_TABLET | Freq: Every day | ORAL | Status: DC | PRN

## 2014-04-27 MED ORDER — POTASSIUM CHLORIDE CRYS ER 20 MEQ PO TBCR
20.0000 meq | EXTENDED_RELEASE_TABLET | Freq: Every day | ORAL | Status: DC
  Administered 2014-04-27: 20 meq via ORAL
  Filled 2014-04-27 (×2): qty 1

## 2014-04-27 MED ORDER — SODIUM CHLORIDE 0.9 % IJ SOLN: 3.0000 mL | INTRAMUSCULAR | Status: DC | PRN

## 2014-04-27 MED ORDER — FUROSEMIDE 40 MG PO TABS
40.0000 mg | ORAL_TABLET | Freq: Every day | ORAL | Status: DC
  Administered 2014-04-28: 40 mg via ORAL
  Filled 2014-04-27 (×2): qty 1

## 2014-04-27 MED ORDER — LISINOPRIL 5 MG PO TABS
5.0000 mg | ORAL_TABLET | Freq: Every day | ORAL | Status: DC
  Administered 2014-04-27 – 2014-04-28 (×2): 5 mg via ORAL
  Filled 2014-04-27 (×2): qty 1

## 2014-04-27 MED ORDER — INSULIN ASPART 100 UNIT/ML ~~LOC~~ SOLN: 0.0000 [IU] | Freq: Three times a day (TID) | SUBCUTANEOUS | Status: DC

## 2014-04-27 MED ORDER — ONDANSETRON HCL 4 MG/2ML IJ SOLN
4.0000 mg | Freq: Four times a day (QID) | INTRAMUSCULAR | Status: DC | PRN
Start: 2014-04-27 — End: 2014-04-28

## 2014-04-27 MED ORDER — FUROSEMIDE 10 MG/ML IJ SOLN
40.0000 mg | Freq: Once | INTRAMUSCULAR | Status: AC
  Administered 2014-04-27: 40 mg via INTRAVENOUS
  Filled 2014-04-27: qty 4

## 2014-04-27 MED ORDER — ACETAMINOPHEN 325 MG PO TABS
650.0000 mg | ORAL_TABLET | ORAL | Status: DC | PRN
Start: 2014-04-27 — End: 2014-04-28

## 2014-04-27 MED ORDER — APIXABAN 5 MG PO TABS
5.0000 mg | ORAL_TABLET | Freq: Two times a day (BID) | ORAL | Status: DC
Start: 2014-04-27 — End: 2014-04-28
  Administered 2014-04-27 – 2014-04-28 (×3): 5 mg via ORAL
  Filled 2014-04-27 (×3): qty 1

## 2014-04-27 MED ORDER — METOPROLOL SUCCINATE ER 50 MG PO TB24
50.0000 mg | ORAL_TABLET | Freq: Two times a day (BID) | ORAL | Status: DC
  Administered 2014-04-27 – 2014-04-28 (×3): 50 mg via ORAL
  Filled 2014-04-27 (×3): qty 1

## 2014-04-27 MED ORDER — SODIUM CHLORIDE 0.9 % IJ SOLN: 3.0000 mL | Freq: Two times a day (BID) | INTRAMUSCULAR | Status: DC

## 2014-04-27 MED ORDER — INSULIN ASPART 100 UNIT/ML ~~LOC~~ SOLN: 0.0000 [IU] | Freq: Every day | SUBCUTANEOUS | Status: DC

## 2014-04-27 MED ORDER — GLYBURIDE 5 MG PO TABS
5.0000 mg | ORAL_TABLET | Freq: Every day | ORAL | Status: DC
  Administered 2014-04-27 – 2014-04-28 (×2): 5 mg via ORAL
  Filled 2014-04-27 (×3): qty 1

## 2014-04-27 MED ORDER — INSULIN ASPART 100 UNIT/ML ~~LOC~~ SOLN
0.0000 [IU] | Freq: Three times a day (TID) | SUBCUTANEOUS | Status: DC
  Administered 2014-04-27 – 2014-04-28 (×2): 7 [IU] via SUBCUTANEOUS

## 2014-04-27 MED ORDER — NAPROXEN 250 MG PO TABS: 250.0000 mg | ORAL_TABLET | Freq: Every day | ORAL | Status: DC | PRN

## 2014-04-27 NOTE — Progress Notes (Signed)
Patient admitted after midnight,  Please see H&P.  Wean off cardizem.  Ran out of digoxin at home  Marlin Canary DO

## 2014-04-27 NOTE — H&P (Signed)
[facility] History and Physical  Kaitlyn Oraeresa Wanat RUE:[id number] DOB: [birth year] DOA: 04/27/2014  Referring physician: EDP PCP: No PCP Per Patient   Chief Complaint: CHF, A.Fib RVR   HPI: Kaitlyn [name] is a 63 y.o. female with history of CHF, A.Fib RVR, DM2.  She presents to the ED with 1 day history of worsening SOB, orthopnea.  Symptoms worse with lying flat or minimal exertion.  There are associated palpitations.  Patient had been feeling fine until yesterday, when she ran out of her digoxin, lisinopril, and glyburide and did not have those yesterday (patient states she missed her follow up appointments after her recent hospital stay for the same last month).  Review of Systems: Systems reviewed.  As above, otherwise negative  Past Medical History  Diagnosis Date  . CHF (congestive heart failure)   . Atrial fibrillation   . Diabetes mellitus without complication     no insuline dependent  . Seizures    Past Surgical History  Procedure Laterality Date  . Orthopedic surgery     Social History:  reports that she has never smoked. She has never used smokeless tobacco. She reports that she does not drink alcohol or use illicit drugs.  Allergies  Allergen Reactions  . Caffeine Other (See Comments)    Seizure  . Penicillins Other (See Comments)    Childhood allery    Family History  Problem Relation Age of Onset  . Diabetes Mellitus II Mother      Prior to Admission medications   Medication Sig Start Date End Date Taking? Authorizing Provider  apixaban (ELIQUIS) 5 MG TABS tablet Take 1 tablet (5 mg total) by mouth 2 (two) times daily. 03/25/14  Yes Shanker Levora DredgeM Ghimire, MD  digoxin (LANOXIN) 0.125 MG tablet Take 1 tablet (0.125 mg total) by mouth daily. 03/25/14  Yes Shanker Levora DredgeM Ghimire, MD  furosemide (LASIX) 40 MG tablet Take 1 tablet (40 mg total) by mouth daily. 03/25/14  Yes Shanker Levora DredgeM Ghimire, MD  glyBURIDE (DIABETA) 5 MG tablet Take 1 tablet (5 mg total) by mouth  daily with breakfast. 03/25/14  Yes Shanker Levora DredgeM Ghimire, MD  lisinopril (PRINIVIL,ZESTRIL) 5 MG tablet Take 1 tablet (5 mg total) by mouth daily. 03/25/14  Yes Shanker Levora DredgeM Ghimire, MD  metoprolol succinate (TOPROL-XL) 50 MG 24 hr tablet Take 1 tablet (50 mg total) by mouth 2 (two) times daily. Take with or immediately following a meal. 03/25/14  Yes Shanker Levora DredgeM Ghimire, MD  naproxen sodium (ANAPROX) 220 MG tablet Take 220 mg by mouth daily as needed (pain).   Yes Historical Provider, MD  potassium chloride SA (K-DUR,KLOR-CON) 20 MEQ tablet Take 1 tablet (20 mEq total) by mouth daily. 03/25/14  Yes Shanker Levora DredgeM Ghimire, MD   Physical Exam: Filed Vitals:   04/27/14 0300  BP: 141/104  Pulse: 111  Temp:   Resp:     BP 141/104 mmHg  Pulse 111  Temp(Src) 97.8 F (36.6 C) (Oral)  Resp 24  SpO2 94%  General Appearance:    Alert, oriented, no distress, appears stated age  Head:    Normocephalic, atraumatic  Eyes:    PERRL, EOMI, sclera non-icteric        Nose:   Nares without drainage or epistaxis. Mucosa, turbinates normal  Throat:   Moist mucous membranes. Oropharynx without erythema or exudate.  Neck:   Supple. No carotid bruits.  No thyromegaly.  No lymphadenopathy.   Back:     No CVA tenderness, no spinal tenderness  Lungs:  Clear to auscultation bilaterally, without wheezes, rhonchi or rales  Chest wall:    No tenderness to palpitation  Heart:    Tachycardic, irregularly irregular, without murmurs, gallops, rubs  Abdomen:     Soft, non-tender, nondistended, normal bowel sounds, no organomegaly  Genitalia:    deferred  Rectal:    deferred  Extremities:   No clubbing, cyanosis or edema.  Pulses:   2+ and symmetric all extremities  Skin:   Skin color, texture, turgor normal, no rashes or lesions  Lymph nodes:   Cervical, supraclavicular, and axillary nodes normal  Neurologic:   CNII-XII intact. Normal strength, sensation and reflexes      throughout    Labs on Admission:  Basic Metabolic  Panel:  Recent Labs Lab 04/27/14 0127  NA 139  K 4.2  CL 104  CO2 20  GLUCOSE 300*  BUN 15  CREATININE 0.92  CALCIUM 8.7   Liver Function Tests:  Recent Labs Lab 04/27/14 0127  AST 14  ALT 12  ALKPHOS 46  BILITOT 0.6  PROT 6.7  ALBUMIN 3.3*   No results for input(s): LIPASE, AMYLASE in the last 168 hours. No results for input(s): AMMONIA in the last 168 hours. CBC:  Recent Labs Lab 04/27/14 0127  WBC 13.9*  NEUTROABS 9.8*  HGB 12.0  HCT 38.0  MCV 101.1*  PLT 250   Cardiac Enzymes:  Recent Labs Lab 04/27/14 0127  TROPONINI <0.30    BNP (last 3 results)  Recent Labs  03/21/14 2126 03/22/14 1312 04/27/14 0128  PROBNP 2946.0* 3181.0* 5351.0*   CBG: No results for input(s): GLUCAP in the last 168 hours.  Radiological Exams on Admission: Dg Chest Port 1 View  04/27/2014   CLINICAL DATA:  Shortness of breath.  EXAM: PORTABLE CHEST - 1 VIEW  COMPARISON:  03/23/2014  FINDINGS: Cardiac enlargement with mild pulmonary vascular congestion. Suggestion of mild interstitial edema in the bases. Small bilateral pleural effusions. Findings are progressing since previous study. Calcified and tortuous aorta. No pneumothorax.  IMPRESSION: Cardiac enlargement with mild pulmonary vascular congestion and early interstitial edema. Small bilateral pleural effusions. Mild progression since previous study.   Electronically Signed   By: Burman Nieves M.D.   On: 04/27/2014 01:54    EKG: Independently reviewed.  Assessment/Plan Principal Problem:   Acute on chronic systolic congestive heart failure Active Problems:   Atrial flutter with rapid ventricular response   Diabetes mellitus type 2, uncontrolled   H/O noncompliance with medical treatment, presenting hazards to health   Chronic atrial fibrillation   CHF exacerbation   Atrial fibrillation with RVR   1. Acute on chronic systolic CHF - due to missing digoxin and lisinopril her A.Fib has gone into RVR with a  rate in the 120s and her BP is running on the high side (most recently 141/104 in the ED) 1. Resume lisinopril 2. Control HR as below 3. Lasix 40mg  IV now, then resume home dosing in the morning 4. Continue other home meds. 2. A.Fib RVR 1. Cardizem gtt acutely for now 2. Resume home digoxin for rate control 3. Continue home metoprolol and eliquis 4. Patient states that she feels cardizem was working better when taken PO than the digoxin as she had been doing in the past 1 month before she moved to [facility].  Explained to her that I will defer the choice of rate control drugs to cardiology (long term CCB usage may not be ideal in systolic CHF patient, etc). 3. DM2 - uncontrolled  due to missing glyburide yesterday. 1. Resume glyburide 2. CBG checks AC/HS    Code Status: Full Code  Family Communication: No family in room Disposition Plan: Admit to inpatient   Time spent: 70 min  Jakim Drapeau M. [facility] Pager [phone]  If 7AM-7PM, please contact the day team taking care of the patient [email or web address] Password [email or web address] 04/27/2014, 3:17 AM

## 2014-04-27 NOTE — ED Notes (Signed)
Pt arrives via EMS from home with c/o palpitations and AFIB, initially 150-190s HR, 140/94 initially, 10MG  Cardizem given PTA, AFIB hx of same, 94% on RA, tachypnea. 20 G IV L AC. 150 cc NS input,

## 2014-04-27 NOTE — ED Notes (Signed)
EDP at bedside  

## 2014-04-27 NOTE — ED Provider Notes (Signed)
CSN: [id number]     Arrival date & time 04/27/14  0103 History  This chart was scribed for Hillary Bow, DO by Abel Presto, ED Scribe. This patient was seen in room 3W05C/3W05C-01 and the patient's care was started at 1:18 AM.    Chief Complaint  Patient presents with  . Atrial Fibrillation    The history is provided by the patient. No language interpreter was used.    HPI Comments: Kaitlyn [name] is a 63 y.o. female with PMHx of AFib who presents to the Emergency Department complaining of palpitations with onset at 6 AM yesterday. Pt notes associated SOB. Shortness of breath exacerbated by lying flat or minimal exertion. Pt notes she has not had sufficient sleep due to shortness of breath in 30 days.  She states the SOB has been worse since yesterday. Pt notes she has been without her digoxin, lisinopril, and glyburide for a day. She took her Eliquis yesterday. Pt took an Aleve for arthralgias in her knee yesterday.  Pt was diagnosed with AFib in 2012. She denies chest pain. Patient states she has missed her follow-up appointments after her recent hospitalization last month.  Past Medical History  Diagnosis Date  . CHF (congestive heart failure)   . Atrial fibrillation   . Diabetes mellitus without complication     no insuline dependent  . Seizures    Past Surgical History  Procedure Laterality Date  . Orthopedic surgery     Family History  Problem Relation Age of Onset  . Diabetes Mellitus II Mother    History  Substance Use Topics  . Smoking status: Never Smoker   . Smokeless tobacco: Never Used  . Alcohol Use: No   OB History    No data available     Review of Systems  Constitutional: Negative for fever and chills.  Respiratory: Positive for shortness of breath. Negative for cough, chest tightness and wheezing.   Cardiovascular: Positive for palpitations. Negative for chest pain and leg swelling.  Gastrointestinal: Negative for nausea, vomiting, abdominal pain,  diarrhea and constipation.  Musculoskeletal: Negative for myalgias, back pain, neck pain and neck stiffness.  Skin: Negative for rash and wound.  Neurological: Negative for dizziness, syncope, weakness, light-headedness and numbness.  All other systems reviewed and are negative.     Allergies  Caffeine and Penicillins  Home Medications   Prior to Admission medications   Medication Sig Start Date End Date Taking? Authorizing Provider  apixaban (ELIQUIS) 5 MG TABS tablet Take 1 tablet (5 mg total) by mouth 2 (two) times daily. 03/25/14  Yes Shanker Levora Dredge, MD  digoxin (LANOXIN) 0.125 MG tablet Take 1 tablet (0.125 mg total) by mouth daily. 03/25/14  Yes Shanker Levora Dredge, MD  furosemide (LASIX) 40 MG tablet Take 1 tablet (40 mg total) by mouth daily. 03/25/14  Yes Shanker Levora Dredge, MD  glyBURIDE (DIABETA) 5 MG tablet Take 1 tablet (5 mg total) by mouth daily with breakfast. 03/25/14  Yes Shanker Levora Dredge, MD  lisinopril (PRINIVIL,ZESTRIL) 5 MG tablet Take 1 tablet (5 mg total) by mouth daily. 03/25/14  Yes Shanker Levora Dredge, MD  metoprolol succinate (TOPROL-XL) 50 MG 24 hr tablet Take 1 tablet (50 mg total) by mouth 2 (two) times daily. Take with or immediately following a meal. 03/25/14  Yes Shanker Levora Dredge, MD  naproxen sodium (ANAPROX) 220 MG tablet Take 220 mg by mouth daily as needed (pain).   Yes Historical Provider, MD  potassium chloride SA (K-DUR,KLOR-CON) 20  MEQ tablet Take 1 tablet (20 mEq total) by mouth daily. 03/25/14  Yes Shanker Levora DredgeM Ghimire, MD   BP 134/82 mmHg  Pulse 113  Temp(Src) 97.7 F (36.5 C) (Oral)  Resp 20  Ht 5\' 11"  (1.803 m)  Wt 300 lb 9.6 oz (136.351 kg)  BMI 41.94 kg/m2  SpO2 97% Physical Exam  Constitutional: She is oriented to person, place, and time. She appears well-developed and well-nourished. No distress.  HENT:  Head: Normocephalic and atraumatic.  Mouth/Throat: Oropharynx is clear and moist.  Eyes: EOM are normal. Pupils are equal, round, and  reactive to light.  Neck: Normal range of motion. Neck supple.  Cardiovascular:  Tachycardia with irregularly irregular rhythm  Pulmonary/Chest: Effort normal. No respiratory distress. She has no wheezes. She has rales.  Crackles bilateral bases.  Abdominal: Soft. Bowel sounds are normal. She exhibits no distension and no mass. There is no tenderness. There is no rebound and no guarding.  Musculoskeletal: Normal range of motion. She exhibits no edema or tenderness.  1+ pitting edema bilateral lower extremities.  Neurological: She is alert and oriented to person, place, and time.  Moves all extremities without deficit. Sensation grossly intact.  Skin: Skin is warm and dry. No rash noted. No erythema.  Psychiatric: She has a normal mood and affect. Her behavior is normal.  Nursing note and vitals reviewed.   ED Course  Procedures (including critical care time) DIAGNOSTIC STUDIES: Oxygen Saturation is 97% on room air, normal by my interpretation.    COORDINATION OF CARE: 1:24 AM Discussed treatment plan with patient at beside, the patient agrees with the plan and has no further questions at this time.   Labs Review Labs Reviewed  PRO B NATRIURETIC PEPTIDE - Abnormal; Notable for the following:    Pro B Natriuretic peptide (BNP) 5351.0 (*)    All other components within normal limits  CBC WITH DIFFERENTIAL - Abnormal; Notable for the following:    WBC 13.9 (*)    RBC 3.76 (*)    MCV 101.1 (*)    RDW 15.9 (*)    Neutro Abs 9.8 (*)    Eosinophils Relative 11 (*)    Eosinophils Absolute 1.5 (*)    All other components within normal limits  COMPREHENSIVE METABOLIC PANEL - Abnormal; Notable for the following:    Glucose, Bld 300 (*)    Albumin 3.3 (*)    GFR calc non Af Amer 65 (*)    GFR calc Af Amer 75 (*)    All other components within normal limits  PROTIME-INR - Abnormal; Notable for the following:    Prothrombin Time 18.4 (*)    INR 1.51 (*)    All other components within  normal limits  GLUCOSE, CAPILLARY - Abnormal; Notable for the following:    Glucose-Capillary 260 (*)    All other components within normal limits  MRSA PCR SCREENING  TROPONIN I    Imaging Review Dg Chest Port 1 View  04/27/2014   CLINICAL DATA:  Shortness of breath.  EXAM: PORTABLE CHEST - 1 VIEW  COMPARISON:  03/23/2014  FINDINGS: Cardiac enlargement with mild pulmonary vascular congestion. Suggestion of mild interstitial edema in the bases. Small bilateral pleural effusions. Findings are progressing since previous study. Calcified and tortuous aorta. No pneumothorax.  IMPRESSION: Cardiac enlargement with mild pulmonary vascular congestion and early interstitial edema. Small bilateral pleural effusions. Mild progression since previous study.   Electronically Signed   By: Burman NievesWilliam  Stevens M.D.   On: 04/27/2014  01:54     EKG Interpretation None      Date: 04/27/2014  Rate: 126  Rhythm: atrial fibrillation  QRS Axis: normal  Intervals: normal  ST/T Wave abnormalities: nonspecific T wave changes  Conduction Disutrbances:none  Narrative Interpretation:   Old EKG Reviewed: unchanged   MDM   Final diagnoses:  SOB (shortness of breath)  CHF exacerbation  Atrial fibrillation with RVR    I personally performed the services described in this documentation, which was scribed in my presence. The recorded information has been reviewed and is accurate.   Discussed with Dr. Julian Reil. Will admit to telemetry bed.   Loren Racer, MD 04/27/14 [phone]

## 2014-04-27 NOTE — ED Notes (Signed)
Hospitialist at bedside.  

## 2014-04-27 NOTE — ED Notes (Signed)
Attempted report 

## 2014-04-28 LAB — BASIC METABOLIC PANEL
Anion gap: 14 (ref 5–15)
BUN: 17 mg/dL (ref 6–23)
CO2: 25 mEq/L (ref 19–32)
Calcium: 9 mg/dL (ref 8.4–10.5)
Chloride: 102 mEq/L (ref 96–112)
Creatinine, Ser: 0.88 mg/dL (ref 0.50–1.10)
GFR calc Af Amer: 79 mL/min — ABNORMAL LOW (ref >90)
GFR calc non Af Amer: 68 mL/min — ABNORMAL LOW (ref >90)
Glucose, Bld: 153 mg/dL — ABNORMAL HIGH (ref 70–99)
Potassium: 3.5 mEq/L — ABNORMAL LOW (ref 3.7–5.3)
Sodium: 141 mEq/L (ref 137–147)

## 2014-04-28 LAB — GLUCOSE, CAPILLARY
Glucose-Capillary: 150 mg/dL — ABNORMAL HIGH (ref 70–99)
Glucose-Capillary: 222 mg/dL — ABNORMAL HIGH (ref 70–99)

## 2014-04-28 MED ORDER — DILTIAZEM HCL 60 MG PO TABS: 60.0000 mg | ORAL_TABLET | Freq: Three times a day (TID) | ORAL | Status: DC

## 2014-04-28 MED ORDER — LISINOPRIL 5 MG PO TABS: 5.0000 mg | ORAL_TABLET | Freq: Every day | ORAL | Status: DC

## 2014-04-28 MED ORDER — GLYBURIDE 5 MG PO TABS: 5.0000 mg | ORAL_TABLET | Freq: Every day | ORAL | Status: DC

## 2014-04-28 MED ORDER — DILTIAZEM HCL 60 MG PO TABS
60.0000 mg | ORAL_TABLET | Freq: Three times a day (TID) | ORAL | Status: DC
  Administered 2014-04-28: 60 mg via ORAL
  Filled 2014-04-28: qty 1

## 2014-04-28 MED ORDER — METOPROLOL SUCCINATE ER 50 MG PO TB24: 75.0000 mg | ORAL_TABLET | Freq: Two times a day (BID) | ORAL | Status: DC

## 2014-04-28 MED ORDER — POTASSIUM CHLORIDE 20 MEQ/15ML (10%) PO SOLN
40.0000 meq | Freq: Once | ORAL | Status: AC
  Administered 2014-04-28: 40 meq via ORAL
  Filled 2014-04-28: qty 30

## 2014-04-28 MED ORDER — FUROSEMIDE 40 MG PO TABS: 40.0000 mg | ORAL_TABLET | Freq: Every day | ORAL | Status: DC

## 2014-04-28 MED ORDER — POTASSIUM CHLORIDE CRYS ER 20 MEQ PO TBCR
40.0000 meq | EXTENDED_RELEASE_TABLET | Freq: Once | ORAL | Status: DC
  Filled 2014-04-28: qty 2

## 2014-04-28 MED ORDER — METOPROLOL SUCCINATE ER 25 MG PO TB24
75.0000 mg | ORAL_TABLET | Freq: Two times a day (BID) | ORAL | Status: DC
Start: 2014-04-28 — End: 2014-05-08

## 2014-04-28 MED ORDER — APIXABAN 5 MG PO TABS: 5.0000 mg | ORAL_TABLET | Freq: Two times a day (BID) | ORAL | Status: DC

## 2014-04-28 MED ORDER — POTASSIUM CHLORIDE 20 MEQ/15ML (10%) PO SOLN
20.0000 meq | Freq: Every day | ORAL | Status: DC
  Administered 2014-04-28: 20 meq via ORAL
  Filled 2014-04-28 (×2): qty 15

## 2014-04-28 MED ORDER — DIGOXIN 125 MCG PO TABS: 0.1250 mg | ORAL_TABLET | Freq: Every day | ORAL | Status: DC

## 2014-04-28 NOTE — Discharge Summary (Signed)
Physician Discharge Summary  Kaitlyn [name] WPV:[id number] DOB: 06-09-1950 DOA: 04/27/2014  PCP: No PCP Per Patient  Admit date: 04/27/2014 Discharge date: 04/28/2014  Time spent: 35 minutes  Recommendations for Outpatient Follow-up:  1. [facility]  Discharge Diagnoses:  Principal Problem:   Acute on chronic systolic congestive heart failure Active Problems:   Atrial flutter with rapid ventricular response   Diabetes mellitus type 2, uncontrolled   H/O noncompliance with medical treatment, presenting hazards to health   Chronic atrial fibrillation   CHF exacerbation   Atrial fibrillation with RVR   Discharge Condition: improved  Diet recommendation: cardiac/diabetic  Filed Weights   04/27/14 0520 04/28/14 0500  Weight: 136.351 kg (300 lb 9.6 oz) 133.856 kg (295 lb 1.6 oz)    History of present illness:  Kaitlyn [name] is a 63 y.o. female with history of CHF, A.Fib RVR, DM2. She presents to the ED with 1 day history of worsening SOB, orthopnea. Symptoms worse with lying flat or minimal exertion. There are associated palpitations.  Patient had been feeling fine until yesterday, when she ran out of her digoxin, lisinopril, and glyburide and did not have those yesterday (patient states she missed her follow up appointments after her recent hospital stay for the same last month).  Hospital Course:  A fib with RVR- ran out of medications as she did not keep 2 of her follow up appointments- stressed importance of being compliant with meds -resumed all meds -wants to go home- has appointment tomm  DM- continue home meds -eating [employer] cookies in room    Procedures:    Consultations:    Discharge Exam: Filed Vitals:   04/28/14 0830  BP: 104/57  Pulse: 84  Temp:   Resp:     General: A+O3, NAD- wants to go home Cardiovascular: irr Respiratory: decreased b/l  Discharge Instructions You were cared for by a  hospitalist during your hospital stay. If you have any questions about your discharge medications or the care you received while you were in the hospital after you are discharged, you can call the unit and asked to speak with the hospitalist on call if the hospitalist that took care of you is not available. Once you are discharged, your primary care physician will handle any further medical issues. Please note that NO REFILLS for any discharge medications will be authorized once you are discharged, as it is imperative that you return to your primary care physician (or establish a relationship with a primary care physician if you do not have one) for your aftercare needs so that they can reassess your need for medications and monitor your lab values.   Current Discharge Medication List    CONTINUE these medications which have NOT CHANGED   Details  apixaban (ELIQUIS) 5 MG TABS tablet Take 1 tablet (5 mg total) by mouth 2 (two) times daily. Qty: 60 tablet, Refills: 0    digoxin (LANOXIN) 0.125 MG tablet Take 1 tablet (0.125 mg total) by mouth daily. Qty: 30 tablet, Refills: 0    furosemide (LASIX) 40 MG tablet Take 1 tablet (40 mg total) by mouth daily. Qty: 30 tablet, Refills: 0    glyBURIDE (DIABETA) 5 MG tablet Take 1 tablet (5 mg total) by mouth daily with breakfast. Qty: 30 tablet, Refills: 0    lisinopril (PRINIVIL,ZESTRIL) 5 MG tablet Take 1 tablet (5 mg total) by mouth daily. Qty: 30 tablet, Refills: 0    metoprolol succinate (TOPROL-XL) 50  MG 24 hr tablet Take 1 tablet (50 mg total) by mouth 2 (two) times daily. Take with or immediately following a meal. Qty: 60 tablet, Refills: 0    naproxen sodium (ANAPROX) 220 MG tablet Take 220 mg by mouth daily as needed (pain).    potassium chloride SA (K-DUR,KLOR-CON) 20 MEQ tablet Take 1 tablet (20 mEq total) by mouth daily. Qty: 30 tablet, Refills: 0       Allergies  Allergen Reactions  . Caffeine Other (See Comments)    Seizure  .  Penicillins Other (See Comments)    Childhood allery   Follow-up Information    Follow up with No PCP Per Patient In 1 week.   Specialty:  General Practice       The results of significant diagnostics from this hospitalization (including imaging, microbiology, ancillary and laboratory) are listed below for reference.    Significant Diagnostic Studies: Dg Chest Port 1 View  04/27/2014   CLINICAL DATA:  Shortness of breath.  EXAM: PORTABLE CHEST - 1 VIEW  COMPARISON:  03/23/2014  FINDINGS: Cardiac enlargement with mild pulmonary vascular congestion. Suggestion of mild interstitial edema in the bases. Small bilateral pleural effusions. Findings are progressing since previous study. Calcified and tortuous aorta. No pneumothorax.  IMPRESSION: Cardiac enlargement with mild pulmonary vascular congestion and early interstitial edema. Small bilateral pleural effusions. Mild progression since previous study.   Electronically Signed   By: Burman NievesWilliam  Stevens M.D.   On: 04/27/2014 01:54    Microbiology: Recent Results (from the past 240 hour(s))  MRSA PCR Screening     Status: None   Collection Time: 04/27/14  4:17 AM  Result Value Ref Range Status   MRSA by PCR NEGATIVE NEGATIVE Final    Comment:        The GeneXpert MRSA Assay (FDA approved for NASAL specimens only), is one component of a comprehensive MRSA colonization surveillance program. It is not intended to diagnose MRSA infection nor to guide or monitor treatment for MRSA infections.      Labs: Basic Metabolic Panel:  Recent Labs Lab 04/27/14 0127 04/28/14 0240  NA 139 141  K 4.2 3.5*  CL 104 102  CO2 20 25  GLUCOSE 300* 153*  BUN 15 17  CREATININE 0.92 0.88  CALCIUM 8.7 9.0   Liver Function Tests:  Recent Labs Lab 04/27/14 0127  AST 14  ALT 12  ALKPHOS 46  BILITOT 0.6  PROT 6.7  ALBUMIN 3.3*   No results for input(s): LIPASE, AMYLASE in the last 168 hours. No results for input(s): AMMONIA in the last 168  hours. CBC:  Recent Labs Lab 04/27/14 0127  WBC 13.9*  NEUTROABS 9.8*  HGB 12.0  HCT 38.0  MCV 101.1*  PLT 250   Cardiac Enzymes:  Recent Labs Lab 04/27/14 0127  TROPONINI <0.30   BNP: BNP (last 3 results)  Recent Labs  03/21/14 2126 03/22/14 1312 04/27/14 0128  PROBNP 2946.0* 3181.0* 5351.0*   CBG:  Recent Labs Lab 04/27/14 1150 04/27/14 1616 04/27/14 2042 04/28/14 0740 04/28/14 1125  GLUCAP 206* 79 130* 150* 222*       Signed:  Lyndon Chenoweth  [facility] 04/28/2014, 12:21 PM

## 2014-04-28 NOTE — Care Management Note (Signed)
    Page 1 of 1   04/28/2014     5:49:06 PM CARE MANAGEMENT NOTE 04/28/2014  Patient:  LISSETT, FAVORITE   Account Number:  [id number]  Date Initiated:  04/28/2014  Documentation initiated by:  [facility]  Subjective/Objective Assessment:   adm: Dyspnea on exertion     Action/Plan:   discharge planning   Anticipated DC Date:  04/28/2014   Anticipated DC Plan:  HOME/SELF CARE         Choice offered to / List presented to:             Status of service:  Completed, signed off Medicare Important Message given?   (If response is "NO", the following Medicare IM given date fields will be blank) Date Medicare IM given:   Medicare IM given by:   Date Additional Medicare IM given:   Additional Medicare IM given by:    Discharge Disposition:  HOME/SELF CARE  Per UR Regulation:    If discussed at Long Length of Stay Meetings, dates discussed:    Comments:  04/28/14 13:13 CM met with pt and unfortunately, pt used the [facility] opportunity on 03/25/14 and it is not available to use again this calendar year.  Pt had an appointment with the [facility] 11/11 but was a no show.  CM gave pt a [place] pamphlet and pt verbalized understanding she is to go to the clinic any weekday morning from 9-10am and ask for: AN APPOINTMENT TO GET A PRIMARY CARE PHYSICIAN; AN APPOINTMENT TO MEET WITH A NAVIGATOR TO BEGIN THE PROCESS OF SECURING MEDICAID IN THIS STATE; AN APPOINTMENT FOR FOLLOW UP CARE.  Pt has limited funds but is housed; has cousins in [place] and was encouraged to allow family to help her financially.  Pt requested ride home.  CSW to arrange for transport home.  No other CM needs were communicated.  Mariane Masters, BSN, CM [phone].

## 2014-04-28 NOTE — Clinical Social Work Note (Signed)
CSW made aware patient requires taxi voucher assistance home by RN Alphonzo Lemmings). Patient's address was verified and CSW to arrange transportation home via [facility] taxi. No further needs. CSW signing off.  Kaitlyn [name], LCSWA Weekend Clinical Social Worker [phone]

## 2014-04-28 NOTE — Discharge Instructions (Signed)
Cardiac Diet  A cardiac diet can help stop heart disease or a stroke from happening. It involves eating less unhealthy fats and eating more healthy fats.   FOODS TO AVOID OR LIMIT  · Limit saturated fats. This type of fat is found in oils and dairy products, such as:  ¨ Coconut oil.  ¨ Palm oil.  ¨ Cocoa butter.  ¨ Butter.  · Avoid trans-fat or hydrogenated oils. These are found in fried or pre-made baked goods, such as:  ¨ Margarine.  ¨ Pre-made cookies, cakes, and crackers.  · Limit processed meats (hot dogs, deli meats, sausage) to 3 ounces a week.  · Limit high-fat meats (marbled meats, fried chicken, or chicken with skin) to 3 ounces a week.  · Limit salt (sodium) to 1500 milligrams a day.  ·  Limit sweets and drinks with added sugar to no more than 5 servings a week. One serving is:  ¨ 1 tablespoon of sugar.  ¨ 1 tablespoon of jelly or jam.  ¨ ½ cup sorbet.  ¨ 1 cup lemonade.  ¨ ½ cup regular soda.  EAT MORE OF THE FOLLOWING FOODS  Fruit  · Eat 4 to 5 servings a day. One serving of fruit is:  ¨ 1 medium whole fruit.  ¨ ¼ cup dried fruit.  ¨ ½ cup of fresh, frozen, or canned fruit.  ¨ ½ cup 100% fruit juice.  Vegetables  · Eat 4 to 5 servings a day. One serving is:  ¨ 1 cup raw leafy vegetables.  ¨ ½ cup raw or cooked, cut-up vegetables.  ¨ ½ cup vegetable juice.  Whole Grains  · Eat 3 servings a day (1 ounce equals 1 serving).  Legumes (such as beans, peas, and lentils)   · Eat at least 4 servings a week (½ cup equals 1 serving).  Nuts and Seeds   · Eat at least 4 servings a week (¼ cup equals 1 serving).  Dietary Fiber  · Eat 20 to 30 grams a day. Some foods high in dietary fiber include:  ¨ Dried beans.  ¨ Citrus fruits.  ¨ Apples, bananas.  ¨ Broccoli, Brussels sprouts, and eggplant.  ¨ Oats.  Omega-3 Fats  · Eat food with omega-3 fats. You can also take a dietary pill (supplement) that has 1 gram of DHA and EPA. Have 3.5 ounces of fatty fish a week, such as:  ¨ Salmon.  ¨ Mackerel.  ¨ Albacore  tuna.  ¨ Sardines.  ¨ Lake trout.  ¨ Herring.  PREPARING YOUR FOOD  · Broil, bake, steam, or roast foods. Do not fry food. Do not cook food in butter (fat).  · Use non-stick cooking sprays.  · Remove skin from poultry, such as chicken and turkey.  · Remove fat from meat.  · Take the fat off the top of stews, soups, and gravy.  · Use lemon or herbs to flavor food instead of using butter or margarine.  · Use nonfat yogurt, salsa, or low-fat dressings for salads.  Document Released: 11/02/2011 Document Reviewed: 11/02/2011  ExitCare® Patient Information ©2015 [employer]. This information is not intended to replace advice given to you by your health care provider. Make sure you discuss any questions you have with your health care provider.

## 2014-05-02 ENCOUNTER — Emergency Department (HOSPITAL_COMMUNITY)
Admission: EM | Admit: 2014-05-02 | Discharge: 2014-05-02 | Disposition: A | Discharge status: Home / Self Care | Payer: 59 | Attending: Emergency Medicine | Admitting: Emergency Medicine

## 2014-05-02 ENCOUNTER — Emergency Department (HOSPITAL_COMMUNITY): Payer: 59

## 2014-05-02 ENCOUNTER — Encounter (HOSPITAL_COMMUNITY): Payer: Self-pay | Admitting: *Deleted

## 2014-05-02 DIAGNOSIS — Z880 Allergy status to penicillin: Secondary | ICD-10-CM | POA: Insufficient documentation

## 2014-05-02 DIAGNOSIS — I509 Heart failure, unspecified: Secondary | ICD-10-CM | POA: Insufficient documentation

## 2014-05-02 DIAGNOSIS — R0602 Shortness of breath: Secondary | ICD-10-CM

## 2014-05-02 DIAGNOSIS — Z79899 Other long term (current) drug therapy: Secondary | ICD-10-CM | POA: Insufficient documentation

## 2014-05-02 DIAGNOSIS — E119 Type 2 diabetes mellitus without complications: Secondary | ICD-10-CM | POA: Insufficient documentation

## 2014-05-02 DIAGNOSIS — I4891 Unspecified atrial fibrillation: Secondary | ICD-10-CM | POA: Insufficient documentation

## 2014-05-02 LAB — CBC WITH DIFFERENTIAL/PLATELET
Basophils Absolute: 0.1 10*3/uL (ref 0.0–0.1)
Basophils Relative: 1 % (ref 0–1)
Eosinophils Absolute: 0.7 10*3/uL (ref 0.0–0.7)
Eosinophils Relative: 5 % (ref 0–5)
HCT: 37.2 % (ref 36.0–46.0)
Hemoglobin: 11.8 g/dL — ABNORMAL LOW (ref 12.0–15.0)
Lymphocytes Relative: 13 % (ref 12–46)
Lymphs Abs: 1.9 10*3/uL (ref 0.7–4.0)
MCH: 32.2 pg (ref 26.0–34.0)
MCHC: 31.7 g/dL (ref 30.0–36.0)
MCV: 101.6 fL — ABNORMAL HIGH (ref 78.0–100.0)
Monocytes Absolute: 0.8 10*3/uL (ref 0.1–1.0)
Monocytes Relative: 6 % (ref 3–12)
Neutro Abs: 11.2 10*3/uL — ABNORMAL HIGH (ref 1.7–7.7)
Neutrophils Relative %: 75 % (ref 43–77)
Platelets: 210 10*3/uL (ref 150–400)
RBC: 3.66 MIL/uL — ABNORMAL LOW (ref 3.87–5.11)
RDW: 15.8 % — ABNORMAL HIGH (ref 11.5–15.5)
WBC: 14.6 10*3/uL — ABNORMAL HIGH (ref 4.0–10.5)

## 2014-05-02 LAB — BASIC METABOLIC PANEL
Anion gap: 14 (ref 5–15)
BUN: 18 mg/dL (ref 6–23)
CO2: 22 mEq/L (ref 19–32)
Calcium: 8.9 mg/dL (ref 8.4–10.5)
Chloride: 100 mEq/L (ref 96–112)
Creatinine, Ser: 0.87 mg/dL (ref 0.50–1.10)
GFR calc Af Amer: 80 mL/min — ABNORMAL LOW (ref >90)
GFR calc non Af Amer: 69 mL/min — ABNORMAL LOW (ref >90)
Glucose, Bld: 324 mg/dL — ABNORMAL HIGH (ref 70–99)
Potassium: 4.1 mEq/L (ref 3.7–5.3)
Sodium: 136 mEq/L — ABNORMAL LOW (ref 137–147)

## 2014-05-02 LAB — DIGOXIN LEVEL: Digoxin Level: <0.3 ng/mL — ABNORMAL LOW (ref 0.8–2.0)

## 2014-05-02 LAB — TROPONIN I: Troponin I: <0.3 ng/mL (ref <0.30)

## 2014-05-02 MED ORDER — METOPROLOL SUCCINATE ER 25 MG PO TB24: 50.0000 mg | ORAL_TABLET | Freq: Two times a day (BID) | ORAL | Status: DC

## 2014-05-02 MED ORDER — METOPROLOL SUCCINATE ER 25 MG PO TB24
50.0000 mg | ORAL_TABLET | ORAL | Status: AC
  Administered 2014-05-02: 50 mg via ORAL
  Filled 2014-05-02: qty 2

## 2014-05-02 MED ORDER — DIGOXIN 0.25 MG/ML IJ SOLN
0.2500 mg | Freq: Once | INTRAMUSCULAR | Status: AC
  Administered 2014-05-02: 0.25 mg via INTRAVENOUS
  Filled 2014-05-02: qty 2

## 2014-05-02 NOTE — ED Notes (Addendum)
Case management at bedside.

## 2014-05-02 NOTE — Discharge Planning (Signed)
CARE MANAGEMENT ED NOTE 05/02/2014  Patient:  Kaitlyn [name], Kaitlyn [name]   Account Number:  [id number]  Date Initiated:  05/02/2014  Documentation initiated by:  [facility]  Subjective/Objective Assessment:   Patient presents to ER from home via EMS.   Patient called due to feeling SOB and her heart racing.  //Home alone     Subjective/Objective Assessment Detail:   39 female presenting with report of shortness of breath x several days. She reports her shortness of breath usually precedes her rapid afib.  She was discharged from the hospital last week with similar symptoms.     Action/Plan:   Pt wants medication delivery service, as she has no transportaion.   Action/Plan Detail:   CM gave pt a [facility] pamphlet and pt verbalized understanding she is to go to the clinic any weekday morning from 9-10am and ask for:  AN APPOINTMENT TO GET A PRIMARY CARE PHYSICIAN.  Notified that [facility] CAN arrange for transportation.   Anticipated DC Date:     05/02/14      DC Planning Services  CM consult  Status of service:  Completed, signed off  ED Comments:    She reports when she was discharged several years ago and was given all her home meds when she was discharged.  She states when she was discharged most recently, she was given prescriptions but none of her meds.   ED Comments Detail:  NCM spoke with pt at bedside regarding obtaining medications.   Pt used the MATCH opportunity on 03/25/14 and it is not available to use again this calendar year.  Pt had an appointment with the [facility] 11/11 but was a no show. NCM presented pt with list of pharmacies that deliver in [place].  Pt very appreciative and plans to follow up with [facility] as well.

## 2014-05-02 NOTE — Discharge Instructions (Signed)
Please follow the directions provided.  Be sure to get your prescriptions filled by one of the pharmacies on the list provided by care management.  It is important to make and keep a follow-up appointment with the [facility] to make sure your medicines ar being helpful.  Don't hesitate to return for any new, worsening or concerning symptoms.     SEEK IMMEDIATE MEDICAL CARE IF:  You have chest pain, abdominal pain, sweating, or weakness.  You feel nauseous.  You have shortness of breath.  You suddenly have swollen feet and ankles.  You feel dizzy.  Your face or limbs feel numb or weak.  You have a change in your vision or speech.

## 2014-05-02 NOTE — ED Provider Notes (Signed)
CSN: [id number]     Arrival date & time 05/02/14  0426 History   First MD Initiated Contact with Patient 05/02/14 0604     Chief Complaint  Patient presents with  . Atrial Fibrillation   (Consider location/radiation/quality/duration/timing/severity/associated sxs/prior Treatment) HPI Kaitlyn [name] is a 63 female presenting with report of shortness of breath x several days.  She reports her shortness of breath usually precedes her rapid afib.  She was discharged from the hospital last week with similar symptoms.  She reports when she was discharged several years ago and was given all her home meds when she was discharged.  She states when she was discharged most recently, she was given prescriptions but none of her meds.  She took her last dose of metoprolol and eliquis yesterday.  Her shortness of breath has improved, and she denies any fevers, chills, cough, chest pain, nausea, vomiting, and abd pain.   Past Medical History  Diagnosis Date  . CHF (congestive heart failure)   . Atrial fibrillation   . Diabetes mellitus without complication     no insuline dependent  . Seizures    Past Surgical History  Procedure Laterality Date  . Orthopedic surgery     Family History  Problem Relation Age of Onset  . Diabetes Mellitus II Mother    History  Substance Use Topics  . Smoking status: Never Smoker   . Smokeless tobacco: Never Used  . Alcohol Use: No   OB History    No data available     Review of Systems  Constitutional: Negative for fever and chills.  HENT: Negative for sore throat.   Eyes: Negative for visual disturbance.  Respiratory: Positive for shortness of breath. Negative for cough.   Cardiovascular: Negative for chest pain, palpitations and leg swelling.  Gastrointestinal: Negative for nausea, vomiting and diarrhea.  Genitourinary: Negative for dysuria.  Musculoskeletal: Negative for myalgias.  Skin: Negative for rash.  Neurological: Negative for weakness,  numbness and headaches.    Allergies  Caffeine and Penicillins  Home Medications   Prior to Admission medications   Medication Sig Start Date End Date Taking? Authorizing Provider  apixaban (ELIQUIS) 5 MG TABS tablet Take 1 tablet (5 mg total) by mouth 2 (two) times daily. 04/28/14   Joseph Art, DO  digoxin (LANOXIN) 0.125 MG tablet Take 1 tablet (0.125 mg total) by mouth daily. 04/28/14   Joseph Art, DO  furosemide (LASIX) 40 MG tablet Take 1 tablet (40 mg total) by mouth daily. 04/28/14   Joseph Art, DO  glyBURIDE (DIABETA) 5 MG tablet Take 1 tablet (5 mg total) by mouth daily with breakfast. 04/28/14   Joseph Art, DO  lisinopril (PRINIVIL,ZESTRIL) 5 MG tablet Take 1 tablet (5 mg total) by mouth daily. 04/28/14   Joseph Art, DO  metoprolol succinate (TOPROL-XL) 25 MG 24 hr tablet Take 3 tablets (75 mg total) by mouth 2 (two) times daily. Take with or immediately following a meal. 04/28/14   Joseph Art, DO  metoprolol succinate (TOPROL-XL) 50 MG 24 hr tablet Take 1 tablet (50 mg total) by mouth 2 (two) times daily. Take with or immediately following a meal. 03/25/14   Maretta Bees, MD  potassium chloride SA (K-DUR,KLOR-CON) 20 MEQ tablet Take 1 tablet (20 mEq total) by mouth daily. 03/25/14   Shanker Levora Dredge, MD   BP 144/89 mmHg  Pulse 110  Temp(Src) 98.1 F (36.7 C) (Oral)  Resp 24  Ht 5\' 11"  (  1.803 m)  Wt 295 lb (133.811 kg)  BMI 41.16 kg/m2  SpO2 92% Physical Exam  Constitutional: She is oriented to person, place, and time. She appears well-developed and well-nourished. No distress.  HENT:  Head: Normocephalic and atraumatic.  Mouth/Throat: Oropharynx is clear and moist. No oropharyngeal exudate.  Eyes: Conjunctivae are normal.  Neck: Neck supple. No thyromegaly present.  Cardiovascular: Intact distal pulses.  An irregular rhythm present. Tachycardia present.  Exam reveals no gallop and no friction rub.   No murmur heard. Pulses:      Dorsalis  pedis pulses are 2+ on the right side, and 2+ on the left side.  Pulmonary/Chest: Effort normal and breath sounds normal. No respiratory distress. She has no wheezes. She has no rales. She exhibits no tenderness.  Abdominal: Soft. There is no tenderness.  Musculoskeletal: She exhibits no tenderness.  Lymphadenopathy:    She has no cervical adenopathy.  Neurological: She is alert and oriented to person, place, and time.  Skin: Skin is warm and dry. No rash noted. She is not diaphoretic.  Psychiatric: She has a normal mood and affect.  Nursing note and vitals reviewed.   ED Course  Procedures (including critical care time) Labs Review Labs Reviewed  BASIC METABOLIC PANEL - Abnormal; Notable for the following:    Sodium 136 (*)    Glucose, Bld 324 (*)    GFR calc non Af Amer 69 (*)    GFR calc Af Amer 80 (*)    All other components within normal limits  CBC WITH DIFFERENTIAL - Abnormal; Notable for the following:    WBC 14.6 (*)    RBC 3.66 (*)    Hemoglobin 11.8 (*)    MCV 101.6 (*)    RDW 15.8 (*)    Neutro Abs 11.2 (*)    All other components within normal limits  DIGOXIN LEVEL - Abnormal; Notable for the following:    Digoxin Level <0.3 (*)    All other components within normal limits  TROPONIN I    Imaging Review Dg Chest 2 View  05/02/2014   CLINICAL DATA:  Short of breath  EXAM: CHEST  2 VIEW  COMPARISON:  04/27/2014  FINDINGS: Cardiac enlargement. Mild vascular congestion unchanged. Negative for heart failure or edema. No pleural effusion. Negative for pneumonia.  IMPRESSION: Cardiac enlargement with mild vascular congestion. Negative for edema.   Electronically Signed   By: Marlan Palauharles  Clark M.D.   On: 05/02/2014 07:07     EKG Interpretation   Date/Time:  Thursday May 02 2014 04:37:56 EST Ventricular Rate:  121 PR Interval:    QRS Duration: 87 QT Interval:  293 QTC Calculation: 416 R Axis:   90 Text Interpretation:  Atrial fibrillation Anteroseptal infarct,  old  Nonspecific repol abnormality, diffuse leads Confirmed by KOHUT  MD,  STEPHEN ([id number]) on 05/02/2014 5:05:34 AM      MDM   Final diagnoses:  SOB (shortness of breath)  Atrial fibrillation, unspecified   63 yo with history of afib with RVR, recently discharged form the hospital for treatment of the same.  Her symptoms this episode only inlcuded shortness of breath.  She took the last of her home meds yesterday and felt the hospital made an error in not providing refills at her last visit.  Case discussed with Dr. Juleen ChinaKohut.  CBC, BMP, Troponin, Dig level and CXR done. Significant abnormalities include glucose: 324, Dig level: 0.3, Most likely related to pt missing her home meds, DIgoxin and Metoprolol given  for rate control.   Pt otherwise appears well but needs social assistance for help with obtaining med and compliance with follow-up.  Care mgmt consulted, who provided list of delivery pharmacies and cab voucher home.  Pt has prescriptions for all her home meds. Pt instructed to to take all home meds as prescribed including diabetes meds.  Pt is  in no acute distress and vital signs are stable.  They appear safe to be discharged.  Discharge include follow-up with their PCP.  Return precautions provided.  Pt aware of plan and in agreement.      Filed Vitals:   05/02/14 0830 05/02/14 0843 05/02/14 0900 05/02/14 0932  BP: 137/71 133/86 121/82   Pulse: 107 98 102   Temp:    98.1 F (36.7 C)  TempSrc:    Oral  Resp: 29 24 19    Height:      Weight:      SpO2: 91% 95% 91%    Meds given in ED:  Medications  metoprolol succinate (TOPROL-XL) 24 hr tablet 50 mg (50 mg Oral Given 05/02/14 0731)  digoxin (LANOXIN) 0.25 MG/ML injection 0.25 mg (0.25 mg Intravenous Given 05/02/14 0732)    Discharge Medication List as of 05/02/2014  9:27 AM         Harle Battiest, NP 05/03/14 1931  Raeford Razor, MD 05/08/14 [phone]

## 2014-05-02 NOTE — ED Notes (Signed)
Patient presents from home via EMS.   Patient called due to feeling SOB and her heart racing.

## 2014-05-02 NOTE — ED Notes (Signed)
Pt provided cab voucher. Taken out in wheelchair to wait for cab. All belongings taken with patient.

## 2014-05-02 NOTE — ED Notes (Signed)
Patient asking when she is going to a room.

## 2014-05-03 NOTE — Progress Notes (Signed)
ED CM contacted patient by phone concerning medication assistance.  Patient stated, that she relocated to the area 2 months ago from [place], she does not have a PCP or [place] health insurance. Patient's insurance is an out of state [employer]. Discussed with patient having her Medicaid transferred to [facility]. Patient was admitted recently to [facility] with A-Fib, and told to f/u with [facility]. Patient states, she has not been able to f/u because she is not able to get out due to not having transportation and she uses a walker and cannot go out long distances without assistance, as per patient. Patient does not have the money to pay for her prescriptions, out of pocket. Patient states, she does not know anyone in the area.  Contacted the [facility] concerning patient. MATCH was suggested, since patient does not have insurance covering [place]. Patient was enrolled in [facility], letter was sent to [facility] for meds. Medications picked up by ED CM, patient was contact arrangements being made to assist patient with delivering medication tomorrow 12/19. ED CM will contact patient with arrangements.

## 2014-05-04 NOTE — Care Management (Signed)
Patient received her medications today. Patient verbalized appreciation. Reminded patient of her appt on 12/23 at the [facility]  Patient states she will be attending, confirmed appt date, and time. No further ED CM needs identified.

## 2014-05-07 ENCOUNTER — Inpatient Hospital Stay (HOSPITAL_COMMUNITY)
Admission: EM | Admit: 2014-05-07 | Discharge: 2014-05-08 | DRG: 308 | Disposition: A | Discharge status: Home / Self Care | Payer: 59 | Attending: Internal Medicine | Admitting: Internal Medicine

## 2014-05-07 ENCOUNTER — Emergency Department (HOSPITAL_COMMUNITY): Payer: 59

## 2014-05-07 ENCOUNTER — Encounter (HOSPITAL_COMMUNITY): Payer: Self-pay | Admitting: Emergency Medicine

## 2014-05-07 DIAGNOSIS — Z833 Family history of diabetes mellitus: Secondary | ICD-10-CM | POA: Diagnosis not present

## 2014-05-07 DIAGNOSIS — Z9119 Patient's noncompliance with other medical treatment and regimen: Secondary | ICD-10-CM | POA: Diagnosis present

## 2014-05-07 DIAGNOSIS — Z818 Family history of other mental and behavioral disorders: Secondary | ICD-10-CM

## 2014-05-07 DIAGNOSIS — Z7901 Long term (current) use of anticoagulants: Secondary | ICD-10-CM | POA: Diagnosis not present

## 2014-05-07 DIAGNOSIS — I5023 Acute on chronic systolic (congestive) heart failure: Secondary | ICD-10-CM

## 2014-05-07 DIAGNOSIS — R946 Abnormal results of thyroid function studies: Secondary | ICD-10-CM | POA: Diagnosis present

## 2014-05-07 DIAGNOSIS — I4891 Unspecified atrial fibrillation: Secondary | ICD-10-CM

## 2014-05-07 DIAGNOSIS — I482 Chronic atrial fibrillation: Principal | ICD-10-CM | POA: Diagnosis present

## 2014-05-07 DIAGNOSIS — E669 Obesity, unspecified: Secondary | ICD-10-CM | POA: Diagnosis present

## 2014-05-07 DIAGNOSIS — I5043 Acute on chronic combined systolic (congestive) and diastolic (congestive) heart failure: Secondary | ICD-10-CM | POA: Diagnosis present

## 2014-05-07 DIAGNOSIS — Z91048 Other nonmedicinal substance allergy status: Secondary | ICD-10-CM | POA: Diagnosis not present

## 2014-05-07 DIAGNOSIS — Z6841 Body Mass Index (BMI) 40.0 and over, adult: Secondary | ICD-10-CM

## 2014-05-07 DIAGNOSIS — I429 Cardiomyopathy, unspecified: Secondary | ICD-10-CM | POA: Diagnosis present

## 2014-05-07 DIAGNOSIS — Z880 Allergy status to penicillin: Secondary | ICD-10-CM | POA: Diagnosis not present

## 2014-05-07 DIAGNOSIS — R0602 Shortness of breath: Secondary | ICD-10-CM | POA: Diagnosis not present

## 2014-05-07 DIAGNOSIS — I351 Nonrheumatic aortic (valve) insufficiency: Secondary | ICD-10-CM | POA: Diagnosis present

## 2014-05-07 DIAGNOSIS — I5022 Chronic systolic (congestive) heart failure: Secondary | ICD-10-CM

## 2014-05-07 DIAGNOSIS — E1165 Type 2 diabetes mellitus with hyperglycemia: Secondary | ICD-10-CM

## 2014-05-07 DIAGNOSIS — Z800 Family history of malignant neoplasm of digestive organs: Secondary | ICD-10-CM | POA: Diagnosis not present

## 2014-05-07 DIAGNOSIS — E1169 Type 2 diabetes mellitus with other specified complication: Secondary | ICD-10-CM | POA: Diagnosis present

## 2014-05-07 HISTORY — DX: Unspecified osteoarthritis, unspecified site: M19.90

## 2014-05-07 HISTORY — DX: Essential (primary) hypertension: I10

## 2014-05-07 HISTORY — DX: Adverse effect of unspecified anesthetic, initial encounter: T41.45XA

## 2014-05-07 HISTORY — DX: Type 2 diabetes mellitus without complications: E11.9

## 2014-05-07 HISTORY — DX: Other complications of anesthesia, initial encounter: T88.59XA

## 2014-05-07 LAB — CBC WITH DIFFERENTIAL/PLATELET
Basophils Absolute: 0.1 10*3/uL (ref 0.0–0.1)
Basophils Relative: 1 % (ref 0–1)
Eosinophils Absolute: 0.7 10*3/uL (ref 0.0–0.7)
Eosinophils Relative: 6 % — ABNORMAL HIGH (ref 0–5)
HCT: 37.8 % (ref 36.0–46.0)
Hemoglobin: 12 g/dL (ref 12.0–15.0)
Lymphocytes Relative: 18 % (ref 12–46)
Lymphs Abs: 2.2 10*3/uL (ref 0.7–4.0)
MCH: 32.1 pg (ref 26.0–34.0)
MCHC: 31.7 g/dL (ref 30.0–36.0)
MCV: 101.1 fL — ABNORMAL HIGH (ref 78.0–100.0)
Monocytes Absolute: 0.6 10*3/uL (ref 0.1–1.0)
Monocytes Relative: 5 % (ref 3–12)
Neutro Abs: 8.2 10*3/uL — ABNORMAL HIGH (ref 1.7–7.7)
Neutrophils Relative %: 70 % (ref 43–77)
Platelets: 224 10*3/uL (ref 150–400)
RBC: 3.74 MIL/uL — ABNORMAL LOW (ref 3.87–5.11)
RDW: 16.2 % — ABNORMAL HIGH (ref 11.5–15.5)
WBC: 11.8 10*3/uL — ABNORMAL HIGH (ref 4.0–10.5)

## 2014-05-07 LAB — TROPONIN I
Troponin I: 0.03 ng/mL (ref <0.031)
Troponin I: <0.03 ng/mL (ref <0.031)
Troponin I: <0.03 ng/mL (ref <0.031)
Troponin I: <0.03 ng/mL (ref <0.031)

## 2014-05-07 LAB — BASIC METABOLIC PANEL
Anion gap: 7 (ref 5–15)
BUN: 15 mg/dL (ref 6–23)
CO2: 24 mmol/L (ref 19–32)
Calcium: 8.6 mg/dL (ref 8.4–10.5)
Chloride: 106 mEq/L (ref 96–112)
Creatinine, Ser: 1.15 mg/dL — ABNORMAL HIGH (ref 0.50–1.10)
GFR calc Af Amer: 57 mL/min — ABNORMAL LOW (ref >90)
GFR calc non Af Amer: 50 mL/min — ABNORMAL LOW (ref >90)
Glucose, Bld: 271 mg/dL — ABNORMAL HIGH (ref 70–99)
Potassium: 3.6 mmol/L (ref 3.5–5.1)
Sodium: 137 mmol/L (ref 135–145)

## 2014-05-07 LAB — HEMOGLOBIN A1C
Hgb A1c MFr Bld: 8.3 % — ABNORMAL HIGH (ref <5.7)
Mean Plasma Glucose: 192 mg/dL — ABNORMAL HIGH (ref <117)

## 2014-05-07 LAB — BRAIN NATRIURETIC PEPTIDE: B Natriuretic Peptide: 860.5 pg/mL — ABNORMAL HIGH (ref 0.0–100.0)

## 2014-05-07 LAB — DIGOXIN LEVEL: Digoxin Level: 0.4 ng/mL — ABNORMAL LOW (ref 0.8–2.0)

## 2014-05-07 LAB — MRSA PCR SCREENING: MRSA by PCR: NEGATIVE

## 2014-05-07 LAB — GLUCOSE, CAPILLARY
Glucose-Capillary: 179 mg/dL — ABNORMAL HIGH (ref 70–99)
Glucose-Capillary: 137 mg/dL — ABNORMAL HIGH (ref 70–99)
Glucose-Capillary: 89 mg/dL (ref 70–99)
Glucose-Capillary: 78 mg/dL (ref 70–99)

## 2014-05-07 LAB — TSH: TSH: 5.616 u[IU]/mL — ABNORMAL HIGH (ref 0.350–4.500)

## 2014-05-07 MED ORDER — DILTIAZEM HCL 30 MG PO TABS
30.0000 mg | ORAL_TABLET | Freq: Four times a day (QID) | ORAL | Status: DC
  Administered 2014-05-07 – 2014-05-08 (×3): 30 mg via ORAL
  Filled 2014-05-07 (×3): qty 1

## 2014-05-07 MED ORDER — INSULIN ASPART 100 UNIT/ML ~~LOC~~ SOLN: 0.0000 [IU] | Freq: Every day | SUBCUTANEOUS | Status: DC

## 2014-05-07 MED ORDER — OFF THE BEAT BOOK
Freq: Once | Status: AC
  Administered 2014-05-07: 16:00:00
  Filled 2014-05-07: qty 1

## 2014-05-07 MED ORDER — POTASSIUM CHLORIDE CRYS ER 20 MEQ PO TBCR
20.0000 meq | EXTENDED_RELEASE_TABLET | Freq: Every day | ORAL | Status: DC
  Filled 2014-05-07: qty 2
  Filled 2014-05-07: qty 1

## 2014-05-07 MED ORDER — DIGOXIN 125 MCG PO TABS
0.1250 mg | ORAL_TABLET | Freq: Every day | ORAL | Status: DC
Start: 2014-05-07 — End: 2014-05-08
  Administered 2014-05-07 – 2014-05-08 (×2): 0.125 mg via ORAL
  Filled 2014-05-07 (×2): qty 1

## 2014-05-07 MED ORDER — LISINOPRIL 5 MG PO TABS
5.0000 mg | ORAL_TABLET | Freq: Every day | ORAL | Status: DC
  Administered 2014-05-07 – 2014-05-08 (×2): 5 mg via ORAL
  Filled 2014-05-07 (×2): qty 1

## 2014-05-07 MED ORDER — FUROSEMIDE 10 MG/ML IJ SOLN: 40.0000 mg | Freq: Once | INTRAMUSCULAR | Status: DC

## 2014-05-07 MED ORDER — SODIUM CHLORIDE 0.9 % IJ SOLN
3.0000 mL | Freq: Two times a day (BID) | INTRAMUSCULAR | Status: DC
  Administered 2014-05-07 – 2014-05-08 (×2): 3 mL via INTRAVENOUS

## 2014-05-07 MED ORDER — GLYBURIDE 5 MG PO TABS
5.0000 mg | ORAL_TABLET | Freq: Every day | ORAL | Status: DC
  Administered 2014-05-07 – 2014-05-08 (×2): 5 mg via ORAL
  Filled 2014-05-07 (×3): qty 1

## 2014-05-07 MED ORDER — INSULIN ASPART 100 UNIT/ML ~~LOC~~ SOLN
0.0000 [IU] | Freq: Three times a day (TID) | SUBCUTANEOUS | Status: DC
  Administered 2014-05-07: 2 [IU] via SUBCUTANEOUS
  Administered 2014-05-07: 1 [IU] via SUBCUTANEOUS

## 2014-05-07 MED ORDER — DILTIAZEM HCL 100 MG IV SOLR
5.0000 mg/h | INTRAVENOUS | Status: DC
  Administered 2014-05-07: 5 mg/h via INTRAVENOUS

## 2014-05-07 MED ORDER — DILTIAZEM LOAD VIA INFUSION
15.0000 mg | Freq: Once | INTRAVENOUS | Status: AC
  Administered 2014-05-07: 15 mg via INTRAVENOUS
  Filled 2014-05-07: qty 15

## 2014-05-07 MED ORDER — FUROSEMIDE 10 MG/ML IJ SOLN
60.0000 mg | Freq: Once | INTRAMUSCULAR | Status: AC
  Administered 2014-05-07: 40 mg via INTRAVENOUS
  Filled 2014-05-07: qty 6

## 2014-05-07 MED ORDER — APIXABAN 5 MG PO TABS
5.0000 mg | ORAL_TABLET | Freq: Two times a day (BID) | ORAL | Status: DC
  Administered 2014-05-07 – 2014-05-08 (×3): 5 mg via ORAL
  Filled 2014-05-07 (×3): qty 1

## 2014-05-07 MED ORDER — FUROSEMIDE 40 MG PO TABS
40.0000 mg | ORAL_TABLET | Freq: Every day | ORAL | Status: DC
  Administered 2014-05-07: 40 mg via ORAL
  Filled 2014-05-07 (×3): qty 1

## 2014-05-07 MED ORDER — METOPROLOL SUCCINATE ER 50 MG PO TB24
75.0000 mg | ORAL_TABLET | Freq: Two times a day (BID) | ORAL | Status: DC
  Administered 2014-05-07 – 2014-05-08 (×3): 75 mg via ORAL
  Filled 2014-05-07 (×6): qty 1

## 2014-05-07 NOTE — Progress Notes (Signed)
Patient refused po lasix and po potassium this am.  She stated that the iv dose she received in the ED was plenty and that the po potassium is too big to swallow and gives her indigestion.  Dr. Isidoro Donning notified

## 2014-05-07 NOTE — H&P (Signed)
Kaitlyn [name] is an 63 y.o. female.    Pcp: unassigned Cardiologist:  None  Chief Complaint: palpitation HPI: 63 yo female with dm2, afib, chf (EF15-20%), seizure do, apparently presents with c/o dyspnea and palpitations.  Denies fever, chills, cough, cp, n/v, diarrhea, brbpr, black stool, weight gain, lower ext edema. Pt presented to ED and found to be in Afib with rvr.  Trop negative.   Pt will be admitted for Afib.   Past Medical History  Diagnosis Date  . CHF (congestive heart failure)     EF 15-20%  . Atrial fibrillation   . Diabetes mellitus without complication     no insuline dependent  . Seizures     Past Surgical History  Procedure Laterality Date  . Orthopedic surgery      right arm  . Knee arthroscopy Left 1966  . Tonsillectomy      Family History  Problem Relation Age of Onset  . Diabetes Mellitus II Mother   . Alzheimer's disease Mother   . Pancreatic cancer Father    Social History:  reports that she has never smoked. She has never used smokeless tobacco. She reports that she does not drink alcohol or use illicit drugs.  Allergies:  Allergies  Allergen Reactions  . Caffeine Other (See Comments)    Seizure  . Penicillins Other (See Comments)    Childhood allery     (Not in a hospital admission)  Results for orders placed or performed during the hospital encounter of 05/07/14 (from the past 48 hour(s))  CBC with Differential     Status: Abnormal   Collection Time: 05/07/14  3:30 AM  Result Value Ref Range   WBC 11.8 (H) 4.0 - 10.5 K/uL   RBC 3.74 (L) 3.87 - 5.11 MIL/uL   Hemoglobin 12.0 12.0 - 15.0 g/dL   HCT 37.8 36.0 - 46.0 %   MCV 101.1 (H) 78.0 - 100.0 fL   MCH 32.1 26.0 - 34.0 pg   MCHC 31.7 30.0 - 36.0 g/dL   RDW 16.2 (H) 11.5 - 15.5 %   Platelets 224 150 - 400 K/uL   Neutrophils Relative % 70 43 - 77 %   Neutro Abs 8.2 (H) 1.7 - 7.7 K/uL   Lymphocytes Relative 18 12 - 46 %   Lymphs Abs 2.2 0.7 - 4.0 K/uL   Monocytes Relative 5 3 - 12  %   Monocytes Absolute 0.6 0.1 - 1.0 K/uL   Eosinophils Relative 6 (H) 0 - 5 %   Eosinophils Absolute 0.7 0.0 - 0.7 K/uL   Basophils Relative 1 0 - 1 %   Basophils Absolute 0.1 0.0 - 0.1 K/uL  Basic metabolic panel     Status: Abnormal   Collection Time: 05/07/14  3:30 AM  Result Value Ref Range   Sodium 137 135 - 145 mmol/L    Comment: Please note change in reference range.   Potassium 3.6 3.5 - 5.1 mmol/L    Comment: Please note change in reference range.   Chloride 106 96 - 112 mEq/L   CO2 24 19 - 32 mmol/L   Glucose, Bld 271 (H) 70 - 99 mg/dL   BUN 15 6 - 23 mg/dL   Creatinine, Ser 1.15 (H) 0.50 - 1.10 mg/dL   Calcium 8.6 8.4 - 10.5 mg/dL   GFR calc non Af Amer 50 (L) >90 mL/min   GFR calc Af Amer 57 (L) >90 mL/min    Comment: (NOTE) The eGFR has been calculated using  the CKD EPI equation. This calculation has not been validated in all clinical situations. eGFR's persistently <90 mL/min signify possible Chronic Kidney Disease.    Anion gap 7 5 - 15  Troponin I     Status: None   Collection Time: 05/07/14  3:30 AM  Result Value Ref Range   Troponin I 0.03 <0.031 ng/mL    Comment:        NO INDICATION OF MYOCARDIAL INJURY. Please note change in reference range.   Digoxin level     Status: Abnormal   Collection Time: 05/07/14  3:30 AM  Result Value Ref Range   Digoxin Level 0.4 (L) 0.8 - 2.0 ng/mL  Brain natriuretic peptide     Status: Abnormal   Collection Time: 05/07/14  4:10 AM  Result Value Ref Range   B Natriuretic Peptide 860.5 (H) 0.0 - 100.0 pg/mL   Dg Chest 2 View  05/07/2014   CLINICAL DATA:  Shortness of breath, atrial fibrillation.  EXAM: CHEST  2 VIEW  COMPARISON:  Chest radiograph May 02, 2014  FINDINGS: Cardiac silhouette is moderately enlarged, unchanged. Tortuous aorta. Similar interstitial prominence. Strandy densities RIGHT lung base. No pleural effusion. No pneumothorax.  Patient is osteopenic.  Soft tissue planes are nonsuspicious.   IMPRESSION: Stable cardiomegaly and interstitial prominence suggesting pulmonary edema. RIGHT lung base atelectasis.   Electronically Signed   By: Elon Alas   On: 05/07/2014 04:02    Review of Systems  Constitutional: Negative for fever, chills, weight loss, malaise/fatigue and diaphoresis.  HENT: Negative for congestion, ear discharge, ear pain, hearing loss, nosebleeds, sore throat and tinnitus.   Eyes: Negative for blurred vision, double vision, photophobia, pain, discharge and redness.  Respiratory: Positive for shortness of breath. Negative for cough, hemoptysis, sputum production, wheezing and stridor.   Cardiovascular: Positive for palpitations. Negative for chest pain, orthopnea, claudication, leg swelling and PND.  Gastrointestinal: Negative for heartburn, nausea, vomiting, abdominal pain, diarrhea, constipation, blood in stool and melena.  Genitourinary: Negative for dysuria, urgency, frequency, hematuria and flank pain.  Musculoskeletal: Negative for myalgias, back pain, joint pain, falls and neck pain.  Skin: Negative for itching and rash.  Neurological: Negative for dizziness, tingling, tremors, sensory change, speech change, focal weakness, seizures, loss of consciousness, weakness and headaches.  Endo/Heme/Allergies: Negative for environmental allergies and polydipsia. Does not bruise/bleed easily.  Psychiatric/Behavioral: Negative for depression, suicidal ideas, hallucinations, memory loss and substance abuse. The patient is not nervous/anxious and does not have insomnia.     Blood pressure 122/90, pulse 90, temperature 98.2 F (36.8 C), temperature source Oral, resp. rate 34, height '5\' 11"'  (1.803 m), weight 133.856 kg (295 lb 1.6 oz), SpO2 95 %. Physical Exam  Constitutional: She is oriented to person, place, and time. She appears well-developed and well-nourished.  HENT:  Head: Normocephalic and atraumatic.  Eyes: Conjunctivae and EOM are normal. Pupils are equal,  round, and reactive to light.  Neck: Normal range of motion. Neck supple. No JVD present. No tracheal deviation present. No thyromegaly present.  Cardiovascular: Exam reveals no gallop and no friction rub.   No murmur heard. Irr, irr, s1, s2  Respiratory: Effort normal and breath sounds normal. No stridor. No respiratory distress. She has no wheezes. She has no rales.  GI: Soft. Bowel sounds are normal. She exhibits no distension. There is no tenderness. There is no rebound and no guarding.  Musculoskeletal: Normal range of motion. She exhibits no edema or tenderness.  Lymphadenopathy:    She has  no cervical adenopathy.  Neurological: She is alert and oriented to person, place, and time. She has normal reflexes. She displays normal reflexes. No cranial nerve deficit. She exhibits normal muscle tone. Coordination normal.  Skin: Skin is warm and dry. No rash noted. No erythema. No pallor.  Psychiatric: She has a normal mood and affect. Her behavior is normal. Judgment and thought content normal.     Assessment/Plan Afib with rvr Tele Cycle cardiac markers Check tsh Check cardiac echo cardizem gtt  CHF (ef =15-20%) Stable,  Cont lasix  Dm2: fsbs ac and qhs  Obeisity counselled on weight loss for 5 minutes  Kenidee Cregan 05/07/2014, 6:03 AM

## 2014-05-07 NOTE — ED Notes (Signed)
Pt refused to take 60mg  of lasix but agreed to take 40mg . EDP notified.

## 2014-05-07 NOTE — Progress Notes (Signed)
Patient seen and examined, admitted this morning by Dr. Selena Batten  Briefly 63 year old female with diabetes, atrial fib ,CHF, chronic systolic with EF 38-75% per 2-D echo in 03/2014 presented with dyspnea and palpitations. She was recently discharged on 05/02/14, reports compliance with her medications. Patient was found to have atrial fibrillation with RVR, started on Cardizem drip.  BP 112/71 mmHg  Pulse 86  Temp(Src) 97.9 F (36.6 C) (Oral)  Resp 21  Ht 5\' 11"  (1.803 m)  Wt 136.487 kg (300 lb 14.4 oz)  BMI 41.99 kg/m2  SpO2 94%   Atrial fibrillation with RVR Agree with admission assessment and plan, continue IV Cardizem, placed back on Toprol-XL, digoxin Cardiology consulted Continue eliquis  Acute on chronic systolic CHF - Likely precipitate due to A. fib with RVR, patient received 1 dose of Lasix 60 mg in the ED, restart oral Lasix - Strict I's and O's and daily weights, follow further cardiology medications  Patient will need PCP and outpatient cardiology follow-ups at the time of discharge, will benefit from [facility] to follow closely to prevent readmissions   Shameika Speelman M.D. [facility] 05/07/2014, 11:31 AM  Pager: [phone]

## 2014-05-07 NOTE — Progress Notes (Signed)
Patient agreed to take am dose of po lasix after speaking with Dr. [facility] [facility].  Po lasix given,

## 2014-05-07 NOTE — ED Provider Notes (Signed)
CSN: [id number]     Arrival date & time 05/07/14  6433 History   First MD Initiated Contact with Patient 05/07/14 [phone]     Chief Complaint  Patient presents with  . Atrial Fibrillation     HPI Patient has known history of atrial fibrillation.  She presents from home with increasing palpitations and shortness of breath with exertion over the past 48 hours.  She was found to be in A. fib with rapid ventricular response by EMS.  She was given Cardizem in route.  Her heart rate is improved down into the 110 range.  She continues to feel some shortness of breath.  Denies significant orthopnea.  She has a known history of congestive heart failure with an EF around 15-20% based on an echocardiogram obtained this year.  She recently relocated from [place].  She does not have a primary cardiologist here.  It sounds like from review the records they were attempting to get her into the heart failure clinic.  Per the medical record there is a history of medication noncompliance.  She denies medication noncompliance and reports she is taking her medications as prescribed.  She denies productive cough.  No fevers or chills.  No unilateral leg swelling.  No history DVT or pulmonary embolism.   Past Medical History  Diagnosis Date  . CHF (congestive heart failure)   . Atrial fibrillation   . Diabetes mellitus without complication     no insuline dependent  . Seizures    Past Surgical History  Procedure Laterality Date  . Orthopedic surgery     Family History  Problem Relation Age of Onset  . Diabetes Mellitus II Mother    History  Substance Use Topics  . Smoking status: Never Smoker   . Smokeless tobacco: Never Used  . Alcohol Use: No   OB History    No data available     Review of Systems  All other systems reviewed and are negative.     Allergies  Caffeine and Penicillins  Home Medications   Prior to Admission medications   Medication Sig Start Date End Date Taking? Authorizing  Provider  apixaban (ELIQUIS) 5 MG TABS tablet Take 1 tablet (5 mg total) by mouth 2 (two) times daily. 04/28/14  Yes Joseph Art, DO  digoxin (LANOXIN) 0.125 MG tablet Take 1 tablet (0.125 mg total) by mouth daily. 04/28/14  Yes Joseph Art, DO  furosemide (LASIX) 40 MG tablet Take 1 tablet (40 mg total) by mouth daily. 04/28/14  Yes Joseph Art, DO  glyBURIDE (DIABETA) 5 MG tablet Take 1 tablet (5 mg total) by mouth daily with breakfast. 04/28/14  Yes Joseph Art, DO  lisinopril (PRINIVIL,ZESTRIL) 5 MG tablet Take 1 tablet (5 mg total) by mouth daily. 04/28/14  Yes Joseph Art, DO  metoprolol succinate (TOPROL-XL) 25 MG 24 hr tablet Take 3 tablets (75 mg total) by mouth 2 (two) times daily. Take with or immediately following a meal. 04/28/14  Yes Joseph Art, DO  potassium chloride SA (K-DUR,KLOR-CON) 20 MEQ tablet Take 1 tablet (20 mEq total) by mouth daily. 03/25/14  Yes Shanker Levora Dredge, MD  metoprolol succinate (TOPROL-XL) 50 MG 24 hr tablet Take 1 tablet (50 mg total) by mouth 2 (two) times daily. Take with or immediately following a meal. Patient not taking: Reported on 05/07/2014 03/25/14   Maretta Bees, MD   BP 116/82 mmHg  Pulse 99  Temp(Src) 98.2 F (36.8 C) (Oral)  Resp 29  Ht 5\' 11"  (1.803 m)  Wt 295 lb 1.6 oz (133.856 kg)  BMI 41.18 kg/m2  SpO2 95% Physical Exam  Constitutional: She is oriented to person, place, and time. She appears well-developed and well-nourished. No distress.  HENT:  Head: Normocephalic and atraumatic.  Eyes: EOM are normal.  Neck: Normal range of motion.  Cardiovascular: Regular rhythm.   Irregularly irregular.  Tachycardia  Pulmonary/Chest: Effort normal and breath sounds normal.  Abdominal: Soft. She exhibits no distension. There is no tenderness.  Musculoskeletal: Normal range of motion.  Neurological: She is alert and oriented to person, place, and time.  Skin: Skin is warm and dry.  Psychiatric: She has a normal mood and  affect. Judgment normal.  Nursing note and vitals reviewed.   ED Course  Procedures (including critical care time) Labs Review Labs Reviewed  CBC WITH DIFFERENTIAL - Abnormal; Notable for the following:    WBC 11.8 (*)    RBC 3.74 (*)    MCV 101.1 (*)    RDW 16.2 (*)    Neutro Abs 8.2 (*)    Eosinophils Relative 6 (*)    All other components within normal limits  BASIC METABOLIC PANEL - Abnormal; Notable for the following:    Glucose, Bld 271 (*)    Creatinine, Ser 1.15 (*)    GFR calc non Af Amer 50 (*)    GFR calc Af Amer 57 (*)    All other components within normal limits  DIGOXIN LEVEL - Abnormal; Notable for the following:    Digoxin Level 0.4 (*)    All other components within normal limits  BRAIN NATRIURETIC PEPTIDE - Abnormal; Notable for the following:    B Natriuretic Peptide 860.5 (*)    All other components within normal limits  TROPONIN I    Imaging Review Dg Chest 2 View  05/07/2014   CLINICAL DATA:  Shortness of breath, atrial fibrillation.  EXAM: CHEST  2 VIEW  COMPARISON:  Chest radiograph May 02, 2014  FINDINGS: Cardiac silhouette is moderately enlarged, unchanged. Tortuous aorta. Similar interstitial prominence. Strandy densities RIGHT lung base. No pleural effusion. No pneumothorax.  Patient is osteopenic.  Soft tissue planes are nonsuspicious.  IMPRESSION: Stable cardiomegaly and interstitial prominence suggesting pulmonary edema. RIGHT lung base atelectasis.   Electronically Signed   By: Awilda Metroourtnay  Bloomer   On: 05/07/2014 04:02   I personally reviewed the imaging tests through PACS system I reviewed available ER/hospitalization records through the EMR   EKG Interpretation   Date/Time:  Tuesday May 07 2014 03:12:36 EST Ventricular Rate:  108 PR Interval:    QRS Duration: 97 QT Interval:  340 QTC Calculation: 456 R Axis:   22 Text Interpretation:  Atrial fibrillation Probable anterior infarct, old  Nonspecific T abnormalities, lateral  leads No significant change was found  Confirmed by Jenise Iannelli  MD, Brandie Lopes ([id number]) on 05/07/2014 3:27:15 AM      MDM   Final diagnoses:  SOB (shortness of breath)  Atrial fibrillation with RVR    Uncontrolled atrial fibrillation.  Patient will likely need medication management.  Possible mild CHF exacerbation given some pulmonary edema and BNP of 800.  IV Lasix given.  Admit to the hospital on a Cardizem drip.    Lyanne CoKevin M Bernardette Waldron, MD 05/07/14 [phone]

## 2014-05-07 NOTE — Progress Notes (Addendum)
Patient with chronic AF, rates currently 70 - 90 on IV diltiazem at 5mg /hr. No active complaints. Vital signs: Filed Vitals:   05/07/14 1638  BP: 123/76  Pulse: 87  Temp: 97.7 F (36.5 C)  Resp: 18   Paged NP on-call to request PO dosing overnight.  Continuing to monitor.  Addendum for 05/07/14 19:50:  Orders received. Will notify NP for problems with change in therapy.

## 2014-05-07 NOTE — Consult Note (Signed)
CARDIOLOGY CONSULT NOTE  Patient ID: Kaitlyn [name] MRN: [id number] DOB/AGE: [birth year] 63 y.o.  Admit date: 05/07/2014 Primary Physician No PCP Per Patient Primary Cardiologist  Seen by CHF but never returned for follow up Chief Complaint  Dyspnea  HPI:  The patient was previously cared for in [place] where she reports she was told she had a cardiomyopathy. It's unclear what the workup was. We saw her in November. She was admitted with shortness of breath and atrial fibrillation. She had been out of her medications. She saw Dr. Shirlee Latch and Dr. Donnie Aho.  She was managed medically. At a point in time during that admission the patient refused to stay in the hospital for further workup. There was mention of doing an outpatient right and left heart catheterization.  During that admission she was noted to have an ejection fraction of 15-20%. She was to follow in the congestive heart failure clinic and to work with case management so that she could get medications.  However, I see that she had 3 cancer appointments in the heart failure clinic and never had any follow-up.  She presented to the emergency room early this morning with palpitations and shortness of breath. She reports that this has been over a couple of days.  She has only been taking half of her Lasix as she was running out.  She was out of the other meds.  She has chronic orthopnea but does not report PND.  She doesn't weigh herself.  She denies any swelling.  She has had no pain, cough fever or chills.  She called EMS and was in rapid atrial fibrillation. She was treated with IV Cardizem on route.  She was treated with IV Lasix and started on Cardizem drip.  Troponin is negative.  ProBNP is 860.5.  Chest x-ray suggested pulmonary edema.   Past Medical History  Diagnosis Date  . CHF (congestive heart failure)     EF 15-20%  . Atrial fibrillation   . Diabetes mellitus without complication     no insuline dependent  . Seizures       Past Surgical History  Procedure Laterality Date  . Orthopedic surgery      right arm  . Knee arthroscopy Left 1966  . Tonsillectomy      Allergies  Allergen Reactions  . Caffeine Other (See Comments)    Seizure  . Penicillins Other (See Comments)    Childhood allery   Prescriptions prior to admission  Medication Sig Dispense Refill Last Dose  . apixaban (ELIQUIS) 5 MG TABS tablet Take 1 tablet (5 mg total) by mouth 2 (two) times daily. 60 tablet 0 05/06/2014 at Unknown time  . digoxin (LANOXIN) 0.125 MG tablet Take 1 tablet (0.125 mg total) by mouth daily. 30 tablet 1 05/06/2014 at Unknown time  . furosemide (LASIX) 40 MG tablet Take 1 tablet (40 mg total) by mouth daily. 30 tablet 0 05/06/2014 at Unknown time  . glyBURIDE (DIABETA) 5 MG tablet Take 1 tablet (5 mg total) by mouth daily with breakfast. 30 tablet 0 05/06/2014 at Unknown time  . lisinopril (PRINIVIL,ZESTRIL) 5 MG tablet Take 1 tablet (5 mg total) by mouth daily. 30 tablet 0 05/06/2014 at Unknown time  . metoprolol succinate (TOPROL-XL) 25 MG 24 hr tablet Take 3 tablets (75 mg total) by mouth 2 (two) times daily. Take with or immediately following a meal. 60 tablet 0 05/06/2014 at 2200  . potassium chloride SA (K-DUR,KLOR-CON) 20 MEQ tablet Take 1 tablet (  20 mEq total) by mouth daily. 30 tablet 0 05/06/2014 at Unknown time  . metoprolol succinate (TOPROL-XL) 50 MG 24 hr tablet Take 1 tablet (50 mg total) by mouth 2 (two) times daily. Take with or immediately following a meal. (Patient not taking: Reported on 05/07/2014) 60 tablet 0 Not Taking at Unknown time   Family History  Problem Relation Age of Onset  . Diabetes Mellitus II Mother   . Alzheimer's disease Mother   . Pancreatic cancer Father     History   Social History  . Marital Status: Single    Spouse Name: N/A    Number of Children: N/A  . Years of Education: N/A   Occupational History  . Not on file.   Social History Main Topics  . Smoking  status: Never Smoker   . Smokeless tobacco: Never Used  . Alcohol Use: No  . Drug Use: No  . Sexual Activity: Not on file   Other Topics Concern  . Not on file   Social History Narrative     ROS:    As stated in the HPI and negative for all other systems.  Physical Exam: Blood pressure 112/71, pulse 86, temperature 97.9 F (36.6 C), temperature source Oral, resp. rate 21, height 5\' 11"  (1.803 m), weight 300 lb 14.4 oz (136.487 kg), SpO2 94 %.  GENERAL:  Well appearing HEENT:  Pupils equal round and reactive, fundi not visualized, oral mucosa unremarkable NECK:  Positive jugular venous distention 8 cm at 45 degrees, waveform within normal limits, carotid upstroke brisk and symmetric, no bruits, no thyromegaly LYMPHATICS:  No cervical, inguinal adenopathy LUNGS:  Clear to auscultation bilaterally BACK:  No CVA tenderness CHEST:  Unremarkable HEART:  PMI not displaced or sustained,S1 and S2 within normal limits, no S3, no clicks, no rubs, no murmurs, irregular  ABD:  Flat, positive bowel sounds normal in frequency in pitch, no bruits, no rebound, no guarding, no midline pulsatile mass, no hepatomegaly, no splenomegaly EXT:  2 plus pulses throughout, trace edema, no cyanosis no clubbing SKIN:  Rash on her face no nodules, positive skin tags.  NEURO:  Cranial nerves II through XII grossly intact, motor grossly intact throughout PSYCH:  Cognitively intact, oriented to person place and time   Labs: Lab Results  Component Value Date   BUN 15 05/07/2014    Lab Results  Component Value Date   NA 137 05/07/2014   K 3.6 05/07/2014   CL 106 05/07/2014   CO2 24 05/07/2014   Lab Results  Component Value Date   TROPONINI <0.03 05/07/2014   Lab Results  Component Value Date   WBC 11.8* 05/07/2014   HGB 12.0 05/07/2014   HCT 37.8 05/07/2014   MCV 101.1* 05/07/2014   PLT 224 05/07/2014    Lab Results  Component Value Date   ALT 12 04/27/2014   AST 14 04/27/2014   ALKPHOS 46  04/27/2014   BILITOT 0.6 04/27/2014      Radiology:   CXR: Stable cardiomegaly and interstitial prominence suggesting pulmonary edema. RIGHT lung base atelectasis.  EKG:  Atrial fib, rate 108, poor anterior R wave progression, nonspecific ST T wave changes.  No change from previous.  05/07/2014   ASSESSMENT AND PLAN:   CARDIOMYOPATHY:    She has not been able to get to CHF clinic secondary to lack of transportation. She was out of meds except she was able to take half of her Lasix.  The primary issue will be helping her  to overcome these difficulties.  For today she agrees to take her second dose of IV Lasix.  We talked about daily weights.  We can cancel the echo.  She had one in November.  We can transition to oral meds in the AM.  Consult the social worker to help with transportation.    ATRIAL FIB:  As above.  Continue Eliquis.     SignedRollene Rotunda: Hollis Oh 05/07/2014, 11:42 AM

## 2014-05-07 NOTE — ED Notes (Signed)
Pt c/o SOB that started yesterday morning. Pt states she was here 5 days ago and dx with afib and started on new meds. EMS gave 10 mg of cardiziem IV en route. HR en route was 40s-140s per EMS.

## 2014-05-07 NOTE — Care Management Note (Signed)
    Page 1 of 1   05/08/2014     2:23:47 PM CARE MANAGEMENT NOTE 05/08/2014  Patient:  Kaitlyn [name], Kaitlyn [name]   Account Number:  [id number]  Date Initiated:  05/07/2014  Documentation initiated by:  GRAVES-BIGELOW,BRENDA  Subjective/Objective Assessment:   Pt admitted for afib./ CHF Initated on Cardizem gtt. Pt is from home alone. Per pt no transportation and she states she cannot ride bus due to DME.     Action/Plan:   Hospital F/u appointment scheduled and placed on AVS form. CSW consulted for transportation. CM did place EPIC referral for [facility] for outpatient referral. Pt is refusing HH services at this time.   Anticipated DC Date:  05/09/2014   Anticipated DC Plan:  HOME/SELF CARE  In-house referral  Clinical Social Worker      DC Planning Services  CM consult  [facility]  Follow-up appt scheduled      Choice offered to / List presented to:             Status of service:  Completed, signed off Medicare Important Message given?  NO (If response is "NO", the following Medicare IM given date fields will be blank) Date Medicare IM given:   Medicare IM given by:   Date Additional Medicare IM given:   Additional Medicare IM given by:    Discharge Disposition:  HOME/SELF CARE  Per UR Regulation:  Reviewed for med. necessity/level of care/duration of stay  If discussed at Long Length of Stay Meetings, dates discussed:    Comments:  05/08/14 Sidney Ace, RN, BSN [phone] Pt for dc home today.  Pt lives alone, and has no transportation to go to pharmacy to pick up Rx meds.  She has a supply of all home meds currently; only new dc med is Cardizem CD 120mg .  Arranged for [facility] to fill Rx for Cardizem and deliver to pt's home later today.  Phone # [phone]; faxed Rx and demographic sheet to pharmacy at [phone].  Pharmacy states med to be delivered to pt home later today after 2pm.  Cost of med quoted as $24.99 for 30 day supply, and pt states  she can afford this. CSW consulted for transportation needs; non-emergent ambulance transport arranged.

## 2014-05-08 ENCOUNTER — Ambulatory Visit: Payer: 59 | Admitting: Cardiology

## 2014-05-08 LAB — BASIC METABOLIC PANEL
Anion gap: 8 (ref 5–15)
BUN: 16 mg/dL (ref 6–23)
CO2: 29 mmol/L (ref 19–32)
Calcium: 8.5 mg/dL (ref 8.4–10.5)
Chloride: 102 mEq/L (ref 96–112)
Creatinine, Ser: 1.12 mg/dL — ABNORMAL HIGH (ref 0.50–1.10)
GFR calc Af Amer: 59 mL/min — ABNORMAL LOW (ref >90)
GFR calc non Af Amer: 51 mL/min — ABNORMAL LOW (ref >90)
Glucose, Bld: 85 mg/dL (ref 70–99)
Potassium: 3.3 mmol/L — ABNORMAL LOW (ref 3.5–5.1)
Sodium: 139 mmol/L (ref 135–145)

## 2014-05-08 LAB — CBC
HCT: 37.5 % (ref 36.0–46.0)
Hemoglobin: 11.6 g/dL — ABNORMAL LOW (ref 12.0–15.0)
MCH: 31.6 pg (ref 26.0–34.0)
MCHC: 30.9 g/dL (ref 30.0–36.0)
MCV: 102.2 fL — ABNORMAL HIGH (ref 78.0–100.0)
Platelets: 216 10*3/uL (ref 150–400)
RBC: 3.67 MIL/uL — ABNORMAL LOW (ref 3.87–5.11)
RDW: 16.5 % — ABNORMAL HIGH (ref 11.5–15.5)
WBC: 12 10*3/uL — ABNORMAL HIGH (ref 4.0–10.5)

## 2014-05-08 LAB — GLUCOSE, CAPILLARY
Glucose-Capillary: 115 mg/dL — ABNORMAL HIGH (ref 70–99)
Glucose-Capillary: 132 mg/dL — ABNORMAL HIGH (ref 70–99)

## 2014-05-08 MED ORDER — DILTIAZEM HCL ER COATED BEADS 120 MG PO CP24
120.0000 mg | ORAL_CAPSULE | Freq: Every day | ORAL | Status: DC
Start: 2014-05-08 — End: 2014-05-08

## 2014-05-08 MED ORDER — LISINOPRIL 5 MG PO TABS: 5.0000 mg | ORAL_TABLET | Freq: Every day | ORAL | Status: DC

## 2014-05-08 MED ORDER — APIXABAN 5 MG PO TABS: 5.0000 mg | ORAL_TABLET | Freq: Two times a day (BID) | ORAL | Status: DC

## 2014-05-08 MED ORDER — GLYBURIDE 5 MG PO TABS: 5.0000 mg | ORAL_TABLET | Freq: Every day | ORAL | Status: DC

## 2014-05-08 MED ORDER — POTASSIUM CHLORIDE CRYS ER 20 MEQ PO TBCR: 20.0000 meq | EXTENDED_RELEASE_TABLET | Freq: Every day | ORAL | Status: DC

## 2014-05-08 MED ORDER — DILTIAZEM HCL ER COATED BEADS 120 MG PO CP24
120.0000 mg | ORAL_CAPSULE | Freq: Every day | ORAL | Status: DC
  Administered 2014-05-08: 120 mg via ORAL
  Filled 2014-05-08: qty 1

## 2014-05-08 MED ORDER — DIGOXIN 125 MCG PO TABS
0.1250 mg | ORAL_TABLET | Freq: Every day | ORAL | Status: DC
Start: 2014-05-08 — End: 2014-06-13

## 2014-05-08 MED ORDER — METOPROLOL SUCCINATE ER 25 MG PO TB24: 75.0000 mg | ORAL_TABLET | Freq: Two times a day (BID) | ORAL | Status: DC

## 2014-05-08 MED ORDER — DILTIAZEM HCL ER COATED BEADS 120 MG PO CP24: 120.0000 mg | ORAL_CAPSULE | Freq: Every day | ORAL | Status: DC

## 2014-05-08 MED ORDER — FUROSEMIDE 40 MG PO TABS
40.0000 mg | ORAL_TABLET | Freq: Every day | ORAL | Status: DC
Start: 2014-05-08 — End: 2014-06-13

## 2014-05-08 NOTE — Discharge Instructions (Signed)
Atrial Fibrillation Atrial fibrillation is a condition that causes your heart to beat irregularly. It may also cause your heart to beat faster than normal. Atrial fibrillation can prevent your heart from pumping blood normally. It increases your risk of stroke and heart problems. HOME CARE  Take medications as told by your doctor.  Only take medications that your doctor says are safe. Some medications can make the condition worse or happen again.  If blood thinners were prescribed by your doctor, take them exactly as told. Too much can cause bleeding. Too little and you will not have the needed protection against stroke and other problems.  Perform blood tests at home if told by your doctor.  Perform blood tests exactly as told by your doctor.  Do not drink alcohol.  Do not drink beverages with caffeine such as coffee, soda, and some teas.  Maintain a healthy weight.  Do not use diet pills unless your doctor says they are safe. They may make heart problems worse.  Follow diet instructions as told by your doctor.  Exercise regularly as told by your doctor.  Keep all follow-up appointments. GET HELP IF:  You notice a change in the speed, rhythm, or strength of your heartbeat.  You suddenly begin peeing (urinating) more often.  You get tired more easily when moving or exercising. GET HELP RIGHT AWAY IF:   You have chest or belly (abdominal) pain.  You feel sick to your stomach (nauseous).  You are short of breath.  You suddenly have swollen feet and ankles.  You feel dizzy.  You face, arms, or legs feel numb or weak.  There is a change in your vision or speech. MAKE SURE YOU:   Understand these instructions.  Will watch your condition.  Will get help right away if you are not doing well or get worse. Document Released: 02/10/2008 Document Revised: 09/17/2013 Document Reviewed: 06/13/2012 [facility] Patient Information 2015 [place], [place]. This information is not  intended to replace advice given to you by your health care provider. Make sure you discuss any questions you have with your health care provider.   Diabetes Mellitus and Food It is important for you to manage your blood sugar (glucose) level. Your blood glucose level can be greatly affected by what you eat. Eating healthier foods in the appropriate amounts throughout the day at about the same time each day will help you control your blood glucose level. It can also help slow or prevent worsening of your diabetes mellitus. Healthy eating may even help you improve the level of your blood pressure and reach or maintain a healthy weight.  HOW CAN FOOD AFFECT ME? Carbohydrates Carbohydrates affect your blood glucose level more than any other type of food. Your dietitian will help you determine how many carbohydrates to eat at each meal and teach you how to count carbohydrates. Counting carbohydrates is important to keep your blood glucose at a healthy level, especially if you are using insulin or taking certain medicines for diabetes mellitus. Alcohol Alcohol can cause sudden decreases in blood glucose (hypoglycemia), especially if you use insulin or take certain medicines for diabetes mellitus. Hypoglycemia can be a life-threatening condition. Symptoms of hypoglycemia (sleepiness, dizziness, and disorientation) are similar to symptoms of having too much alcohol.  If your health care provider has given you approval to drink alcohol, do so in moderation and use the following guidelines:  Women should not have more than one drink per day, and men should not have more than  two drinks per day. One drink is equal to:  12 oz of beer.  5 oz of wine.  1 oz of hard liquor.  Do not drink on an empty stomach.  Keep yourself hydrated. Have water, diet soda, or unsweetened iced tea.  Regular soda, juice, and other mixers might contain a lot of carbohydrates and should be counted. WHAT FOODS ARE NOT  RECOMMENDED? As you make food choices, it is important to remember that all foods are not the same. Some foods have fewer nutrients per serving than other foods, even though they might have the same number of calories or carbohydrates. It is difficult to get your body what it needs when you eat foods with fewer nutrients. Examples of foods that you should avoid that are high in calories and carbohydrates but low in nutrients include:  Trans fats (most processed foods list trans fats on the Nutrition Facts label).  Regular soda.  Juice.  Candy.  Sweets, such as cake, pie, doughnuts, and cookies.  Fried foods. WHAT FOODS CAN I EAT? Have nutrient-rich foods, which will nourish your body and keep you healthy. The food you should eat also will depend on several factors, including:  The calories you need.  The medicines you take.  Your weight.  Your blood glucose level.  Your blood pressure level.  Your cholesterol level. You also should eat a variety of foods, including:  Protein, such as meat, poultry, fish, tofu, nuts, and seeds (lean animal proteins are best).  Fruits.  Vegetables.  Dairy products, such as milk, cheese, and yogurt (low fat is best).  Breads, grains, pasta, cereal, rice, and beans.  Fats such as olive oil, trans fat-free margarine, canola oil, avocado, and olives. DOES EVERYONE WITH DIABETES MELLITUS HAVE THE SAME MEAL PLAN? Because every person with diabetes mellitus is different, there is not one meal plan that works for everyone. It is very important that you meet with a dietitian who will help you create a meal plan that is just right for you. Document Released: 01/28/2005 Document Revised: 05/08/2013 Document Reviewed: 03/30/2013 [facility] Patient Information 2015 [place]. This information is not intended to replace advice given to you by your health care provider. Make sure you discuss any questions you have with your health care  provider.  Low-Sodium Eating Plan Sodium raises blood pressure and causes water to be held in the body. Getting less sodium from food will help lower your blood pressure, reduce any swelling, and protect your heart, liver, and kidneys. We get sodium by adding salt (sodium chloride) to food. Most of our sodium comes from canned, boxed, and frozen foods. Restaurant foods, fast foods, and pizza are also very high in sodium. Even if you take medicine to lower your blood pressure or to reduce fluid in your body, getting less sodium from your food is important. WHAT IS MY PLAN? Most people should limit their sodium intake to 2,300 mg a day. Your health care provider recommends that you limit your sodium intake to __________ a day.  WHAT DO I NEED TO KNOW ABOUT THIS EATING PLAN? For the low-sodium eating plan, you will follow these general guidelines:  Choose foods with a % Daily Value for sodium of less than 5% (as listed on the food label).   Use salt-free seasonings or herbs instead of table salt or sea salt.   Check with your health care provider or pharmacist before using salt substitutes.   Eat fresh foods.  Eat more vegetables and  fruits.  Limit canned vegetables. If you do use them, rinse them well to decrease the sodium.   Limit cheese to 1 oz (28 g) per day.   Eat lower-sodium products, often labeled as "lower sodium" or "no salt added."  Avoid foods that contain monosodium glutamate (MSG). MSG is sometimes added to [place] food and some canned foods.  Check food labels (Nutrition Facts labels) on foods to learn how much sodium is in one serving.  Eat more home-cooked food and less restaurant, buffet, and fast food.  When eating at a restaurant, ask that your food be prepared with less salt or none, if possible.  HOW DO I READ FOOD LABELS FOR SODIUM INFORMATION? The Nutrition Facts label lists the amount of sodium in one serving of the food. If you eat more than one  serving, you must multiply the listed amount of sodium by the number of servings. Food labels may also identify foods as:  Sodium free--Less than 5 mg in a serving.  Very low sodium--35 mg or less in a serving.  Low sodium--140 mg or less in a serving.  Light in sodium--50% less sodium in a serving. For example, if a food that usually has 300 mg of sodium is changed to become light in sodium, it will have 150 mg of sodium.  Reduced sodium--25% less sodium in a serving. For example, if a food that usually has 400 mg of sodium is changed to reduced sodium, it will have 300 mg of sodium. WHAT FOODS CAN I EAT? Grains Low-sodium cereals, including oats, puffed wheat and rice, and shredded wheat cereals. Low-sodium crackers. Unsalted rice and pasta. Lower-sodium bread.  Vegetables Frozen or fresh vegetables. Low-sodium or reduced-sodium canned vegetables. Low-sodium or reduced-sodium tomato sauce and paste. Low-sodium or reduced-sodium tomato and vegetable juices.  Fruits Fresh, frozen, and canned fruit. Fruit juice.  Meat and Other Protein Products Low-sodium canned tuna and salmon. Fresh or frozen meat, poultry, seafood, and fish. Lamb. Unsalted nuts. Dried beans, peas, and lentils without added salt. Unsalted canned beans. Homemade soups without salt. Eggs.  Dairy Milk. Soy milk. Ricotta cheese. Low-sodium or reduced-sodium cheeses. Yogurt.  Condiments Fresh and dried herbs and spices. Salt-free seasonings. Onion and garlic powders. Low-sodium varieties of mustard and ketchup. Lemon juice.  Fats and Oils Reduced-sodium salad dressings. Unsalted butter.  Other Unsalted popcorn and pretzels.  The items listed above may not be a complete list of recommended foods or beverages. Contact your dietitian for more options. WHAT FOODS ARE NOT RECOMMENDED? Grains Instant hot cereals. Bread stuffing, pancake, and biscuit mixes. Croutons. Seasoned rice or pasta mixes. Noodle soup cups.  Boxed or frozen macaroni and cheese. Self-rising flour. Regular salted crackers. Vegetables Regular canned vegetables. Regular canned tomato sauce and paste. Regular tomato and vegetable juices. Frozen vegetables in sauces. Salted french fries. Olives. Rosita Fire. Relishes. Sauerkraut. Salsa. Meat and Other Protein Products Salted, canned, smoked, spiced, or pickled meats, seafood, or fish. Bacon, ham, sausage, hot dogs, corned beef, chipped beef, and packaged luncheon meats. Salt pork. Jerky. Pickled herring. Anchovies, regular canned tuna, and sardines. Salted nuts. Dairy Processed cheese and cheese spreads. Cheese curds. Blue cheese and cottage cheese. Buttermilk.  Condiments Onion and garlic salt, seasoned salt, table salt, and sea salt. Canned and packaged gravies. Worcestershire sauce. Tartar sauce. Barbecue sauce. Teriyaki sauce. Soy sauce, including reduced sodium. Steak sauce. Fish sauce. Oyster sauce. Cocktail sauce. Horseradish. Regular ketchup and mustard. Meat flavorings and tenderizers. Bouillon cubes. Hot sauce. Tabasco sauce. Marinades. Taco  seasonings. Relishes. Fats and Oils Regular salad dressings. Salted butter. Margarine. Ghee. Bacon fat.  Other Potato and tortilla chips. Corn chips and puffs. Salted popcorn and pretzels. Canned or dried soups. Pizza. Frozen entrees and pot pies.  The items listed above may not be a complete list of foods and beverages to avoid. Contact your dietitian for more information. Document Released: 10/23/2001 Document Revised: 05/08/2013 Document Reviewed: 03/07/2013 [facility] Patient Information 2015 [place], [place]. This information is not intended to replace advice given to you by your health care provider. Make sure you discuss any questions you have with your health care provider.

## 2014-05-08 NOTE — Progress Notes (Signed)
CSW (Clinical Child psychotherapist) notified pt will need assistance with transportation at discharge. CSW spoke with pt and pt does not have any support in the area that can help with transport. Pt reported that she does not have her walker and cannot walk with out. CSW discussed concern for safety if CSW arranges taxi. Pt in agreement for CSW to arrange non-emergent ambulance transport. CSW arranged transport for 2pm. Pt and pt nurse aware. CSW signing off.  Thane Age, LCSWA [phone]

## 2014-05-08 NOTE — Progress Notes (Signed)
Referral received from inpatient RNCM on 05/07/14 for possible [facility] Care Management services.  RNCM spoke with the patient today and the patient states she has recently moved to [place] and has not established a primary care provider. She has an appointment with [facility] on 05/15/14.  RNCM also checked with insurance and patient is not on the contract list for [facility] Care Management. Unfortunately, [facility] Care Management is unable to enroll this patient for services. Will make inpatient RNCM aware of the results of referral.  Any questions, please contact Charlesetta Shanks, RN, BSN, CCM, [facility] Liaison at [phone].

## 2014-05-08 NOTE — Progress Notes (Signed)
Patient Name: Kaitlyn [name] Date of Encounter: 05/08/2014  Primary cardiologist: Dr. Shirlee LatchMcLean   Active Problems:   Diabetes mellitus type 2, uncontrolled   Chronic systolic CHF (congestive heart failure)   Atrial fibrillation with rapid ventricular response   Atrial fibrillation    SUBJECTIVE  Breathing well, wish to go home. Denies any CP.  CURRENT MEDS . apixaban  5 mg Oral BID  . digoxin  0.125 mg Oral Daily  . diltiazem  30 mg Oral 4 times per day  . furosemide  40 mg Oral Daily  . glyBURIDE  5 mg Oral Q breakfast  . insulin aspart  0-5 Units Subcutaneous QHS  . insulin aspart  0-9 Units Subcutaneous TID WC  . lisinopril  5 mg Oral Daily  . metoprolol succinate  75 mg Oral BID  . potassium chloride SA  20 mEq Oral Daily  . sodium chloride  3 mL Intravenous Q12H    OBJECTIVE  Filed Vitals:   05/07/14 2110 05/08/14 0048 05/08/14 0557 05/08/14 0816  BP: 118/84 117/48 104/66 103/63  Pulse: 96 87 90 90  Temp: 98.2 F (36.8 C) 97.6 F (36.4 C) 97.9 F (36.6 C) 97.9 F (36.6 C)  TempSrc: Oral Oral Oral Oral  Resp:    19  Height:      Weight:   288 lb 11.2 oz (130.953 kg)   SpO2: 91% 92% 96% 94%    Intake/Output Summary (Last 24 hours) at 05/08/14 0831 Last data filed at 05/08/14 0818  Gross per 24 hour  Intake   1680 ml  Output   1900 ml  Net   -220 ml   Filed Weights   05/07/14 0419 05/07/14 0655 05/08/14 0557  Weight: 295 lb 1.6 oz (133.856 kg) 300 lb 14.4 oz (136.487 kg) 288 lb 11.2 oz (130.953 kg)    PHYSICAL EXAM  General: Pleasant, NAD. Neuro: Alert and oriented X 3. Moves all extremities spontaneously. Psych: Normal affect. HEENT:  Normal  Neck: Supple without bruits or JVD. Lungs:  Resp regular and unlabored, CTA. Heart: irregular no s3, s4, or murmurs. Abdomen: Soft, non-tender, non-distended, BS + x 4.  Extremities: No clubbing, cyanosis. DP/PT/Radials 2+ and equal bilaterally. Mainly nonpitting edema, does have 1+ pitting edema.    Accessory Clinical Findings  CBC  Recent Labs  05/07/14 0330 05/08/14 0500  WBC 11.8* 12.0*  NEUTROABS 8.2*  --   HGB 12.0 11.6*  HCT 37.8 37.5  MCV 101.1* 102.2*  PLT 224 216   Basic Metabolic Panel  Recent Labs  05/07/14 0330 05/08/14 0500  NA 137 139  K 3.6 3.3*  CL 106 102  CO2 24 29  GLUCOSE 271* 85  BUN 15 16  CREATININE 1.15* 1.12*  CALCIUM 8.6 8.5   Cardiac Enzymes  Recent Labs  05/07/14 0805 05/07/14 1350 05/07/14 1932  TROPONINI <0.03 <0.03 <0.03   Hemoglobin A1C  Recent Labs  05/07/14 0805  HGBA1C 8.3*   Thyroid Function Tests  Recent Labs  05/07/14 0805  TSH 5.616*    TELE A-fib with HR 70-80s    ECG  No new EKG  Echocardiogram  - Left ventricle: The cavity size was severely dilated. Systolic function was severely reduced. The estimated ejection fraction was in the range of 15% to 20%. Severe diffuse hypokinesis. Regional wall motion abnormalities cannot be excluded. - Aortic valve: There was severe regurgitation. - Left atrium: The atrium was severely dilated. - Right ventricle: The cavity size was dilated. Wall thickness was  normal. - Right atrium: The atrium was moderately to severely dilated.    Radiology/Studies  Dg Chest 2 View  05/07/2014   CLINICAL DATA:  Shortness of breath, atrial fibrillation.  EXAM: CHEST  2 VIEW  COMPARISON:  Chest radiograph May 02, 2014  FINDINGS: Cardiac silhouette is moderately enlarged, unchanged. Tortuous aorta. Similar interstitial prominence. Strandy densities RIGHT lung base. No pleural effusion. No pneumothorax.  Patient is osteopenic.  Soft tissue planes are nonsuspicious.  IMPRESSION: Stable cardiomegaly and interstitial prominence suggesting pulmonary edema. RIGHT lung base atelectasis.   Electronically Signed   By: Awilda Metro   On: 05/07/2014 04:02   Dg Chest 2 View  05/02/2014   CLINICAL DATA:  Short of breath  EXAM: CHEST  2 VIEW  COMPARISON:  04/27/2014   FINDINGS: Cardiac enlargement. Mild vascular congestion unchanged. Negative for heart failure or edema. No pleural effusion. Negative for pneumonia.  IMPRESSION: Cardiac enlargement with mild vascular congestion. Negative for edema.   Electronically Signed   By: Marlan Palau M.D.   On: 05/02/2014 07:07   Dg Chest Port 1 View  04/27/2014   CLINICAL DATA:  Shortness of breath.  EXAM: PORTABLE CHEST - 1 VIEW  COMPARISON:  03/23/2014  FINDINGS: Cardiac enlargement with mild pulmonary vascular congestion. Suggestion of mild interstitial edema in the bases. Small bilateral pleural effusions. Findings are progressing since previous study. Calcified and tortuous aorta. No pneumothorax.  IMPRESSION: Cardiac enlargement with mild pulmonary vascular congestion and early interstitial edema. Small bilateral pleural effusions. Mild progression since previous study.   Electronically Signed   By: Burman Nieves M.D.   On: 04/27/2014 01:54    ASSESSMENT AND PLAN  1. A-fib with RVR, likely chronic (as review of recent EKG does not show any NSR)  - rate controlled on short acting cardizem, will transition to 120mg  long acting cardizem. Continue 75mg  BID metoprolol XL   - continue eliquis, states she has enough at home. Continue digoxin.  2. Acute on chronic systolic and diastolic heart failure in the setting of rapid a-fib  - secondary to failed followup with HF clinic and running out of medication. Poor compliance. Was only taking half of her lasix. Social worker consulted to help with transportation  - Echo 03/2014 EF 15-20%, diffuse hypokinesis, severe AR, severely dilated LA  - improved after IV lasix, weight down 12 lbs?, unclear accuracy. I/O did not change much. Improved on IV lasix. Back on her home lasix.  - recheck Echo in Feb, may need ICD if still low. However ideally would have outpt [facility] before then. Continue lisinopril.  - followup with Dr. Shirlee Latch in 1-2 weeks at HF clinic  3. Elevated TSH:  outpt workup  4. DM 5. Severe AR  Signed, Amedeo Plenty Pager: [phone] Patient seen and examined. I agree with the assessment and plan as detailed above. See also my additional thoughts below.   We have made appointment for the patient to be seen back in the heart failure clinic within one to 2 weeks.  Willa Rough, MD, [facility] 05/08/2014 11:16 AM

## 2014-05-08 NOTE — Discharge Summary (Signed)
Physician Discharge Summary  Patient ID: Kaitlyn [name] MRN: [id number] DOB/AGE: 1950-08-06 63 y.o.  Admit date: 05/07/2014 Discharge date: 05/08/2014  Primary Care Physician:  No PCP Per Patient  Discharge Diagnoses:    . Atrial fibrillation with rapid ventricular response . acute on Chronic systolic CHF (congestive heart failure) . Diabetes mellitus type 2, uncontrolled . diabetes mellitus  Elevated TSH  Severe aortic regurgitation  Medical noncompliance  Consults: Cardiology, Dr. Antoine Poche   Recommendations for Outpatient Follow-up:  Please check T3 and T4, patient may need Synthroid  TESTS THAT NEED FOLLOW-UP CBC, BMET, T3, free T4   DIET: Carb modified, heart healthy diet    Allergies:   Allergies  Allergen Reactions  . Caffeine Other (See Comments)    Seizure  . Penicillins Other (See Comments)    Childhood allery     Discharge Medications:   Medication List    TAKE these medications        apixaban 5 MG Tabs tablet  Commonly known as:  ELIQUIS  Take 1 tablet (5 mg total) by mouth 2 (two) times daily.     digoxin 0.125 MG tablet  Commonly known as:  LANOXIN  Take 1 tablet (0.125 mg total) by mouth daily.     diltiazem 120 MG 24 hr capsule  Commonly known as:  CARDIZEM CD  Take 1 capsule (120 mg total) by mouth daily.     furosemide 40 MG tablet  Commonly known as:  LASIX  Take 1 tablet (40 mg total) by mouth daily.     glyBURIDE 5 MG tablet  Commonly known as:  DIABETA  Take 1 tablet (5 mg total) by mouth daily with breakfast.     lisinopril 5 MG tablet  Commonly known as:  PRINIVIL,ZESTRIL  Take 1 tablet (5 mg total) by mouth daily.     metoprolol succinate 25 MG 24 hr tablet  Commonly known as:  TOPROL-XL  Take 3 tablets (75 mg total) by mouth 2 (two) times daily. Take with or immediately following a meal.     potassium chloride SA 20 MEQ tablet  Commonly known as:  K-DUR,KLOR-CON  Take 1 tablet (20 mEq total) by mouth daily.          Brief H and P: For complete details please refer to admission H and P, but in brief patient is a 63 year old female with type 2 diabetes, atrial fibrillation, chronic systolic CHF EF 15-20% per echocardiogram in 11 2015, seizure disorder, presented with dyspnea and palpitations. Patient was found to have atrial fibrillation with RVR and was admitted.  Hospital Course:   Atrial fibrillation with RVR Patient was placed on IV Cardizem drip, restarted on metoprolol. Patient was continued on eliquis. Patient had run out of her medications and did not follow-up with cardiology appointment due to transportation issues. Also was not taking her medications as advised, was only taking half of the Lasix. Heart rate was improved and patient was transitioned to oral Cardizem 120 mg daily to continue with the Toprol. CHADS-VASc 4, patient is on eliquis BID for anticoagulation  Acute on chronic systolic CHF Again patient was not able to get to the CHF clinic secondary to lack of transportation and was out of her medications, was taking half of her Lasix at the time of admission. She was placed on IV Lasix, she recently had a 2-D echocardiogram with EF of 15-20% in 11/15. Case management was consulted to assist with transportation, patient was also connected with [facility]. Per cardiology,  recheck echo in February, may need ICD if still EF low. Continue lisinopril. Patient will follow-up with heart failure clinic on 05/14/14.  Diabetes mellitus type 2 Patient is currently on glyburide. Patient will need hemoglobin A1c and close follow-[facility]  Medical noncompliance Patient was also noted to have some degree of noncompliance during hospitalization, O2 evaluation was done at resting in 90s however patient refused O2 evaluation on ambulation. Patient was refusing Lasix and even potassium replacement during hospitalization.   Day of Discharge BP 101/66 mmHg  Pulse 92  Temp(Src)  97.9 F (36.6 C) (Oral)  Resp 20  Ht 5\' 11"  (1.803 m)  Wt 130.953 kg (288 lb 11.2 oz)  BMI 40.28 kg/m2  SpO2 95%  Physical Exam: General: Alert and awake oriented x3 not in any acute distress. CVS: Irregularly irregular Chest: clear to auscultation bilaterally, no wheezing rales or rhonchi Abdomen: soft nontender, nondistended, normal bowel sounds Extremities: no cyanosis, clubbing, 1+ edema noted bilaterally Neuro: Cranial nerves II-XII intact, no focal neurological deficits   The results of significant diagnostics from this hospitalization (including imaging, microbiology, ancillary and laboratory) are listed below for reference.    LAB RESULTS: Basic Metabolic Panel:  Recent Labs Lab 05/07/14 0330 05/08/14 0500  NA 137 139  K 3.6 3.3*  CL 106 102  CO2 24 29  GLUCOSE 271* 85  BUN 15 16  CREATININE 1.15* 1.12*  CALCIUM 8.6 8.5   Liver Function Tests: No results for input(s): AST, ALT, ALKPHOS, BILITOT, PROT, ALBUMIN in the last 168 hours. No results for input(s): LIPASE, AMYLASE in the last 168 hours. No results for input(s): AMMONIA in the last 168 hours. CBC:  Recent Labs Lab 05/07/14 0330 05/08/14 0500  WBC 11.8* 12.0*  NEUTROABS 8.2*  --   HGB 12.0 11.6*  HCT 37.8 37.5  MCV 101.1* 102.2*  PLT 224 216   Cardiac Enzymes:  Recent Labs Lab 05/07/14 1350 05/07/14 1932  TROPONINI <0.03 <0.03   BNP: Invalid input(s): POCBNP CBG:  Recent Labs Lab 05/08/14 0731 05/08/14 1146  GLUCAP 115* 132*    Significant Diagnostic Studies:  Dg Chest 2 View  05/07/2014   CLINICAL DATA:  Shortness of breath, atrial fibrillation.  EXAM: CHEST  2 VIEW  COMPARISON:  Chest radiograph May 02, 2014  FINDINGS: Cardiac silhouette is moderately enlarged, unchanged. Tortuous aorta. Similar interstitial prominence. Strandy densities RIGHT lung base. No pleural effusion. No pneumothorax.  Patient is osteopenic.  Soft tissue planes are nonsuspicious.  IMPRESSION: Stable  cardiomegaly and interstitial prominence suggesting pulmonary edema. RIGHT lung base atelectasis.   Electronically Signed   By: Awilda Metroourtnay  Bloomer   On: 05/07/2014 04:02       Disposition and Follow-up:    DISPOSITION: Home   DISCHARGE FOLLOW-UP Follow-up Information    Follow up with [facility]     On 05/15/2014.   Why:  Hospital Follow /up @ 1030   Contact information:   [address] [place] [id number] [phone]      Follow up with [facility] On 05/14/2014.   Specialty:  Cardiology   Why:  Garage Code: 0006. 10:40am   Contact information:   [address] [id number] mc [place] [id number] [phone]       Time spent on Discharge: 40 minutes  Signed:   Jacquilyn Seldon M.D. [facility] 05/08/2014, 12:21 PM Pager: [phone]

## 2014-05-08 NOTE — Progress Notes (Signed)
Attempted to assess ambulating O2 sat per Dr. Fransico Him order, patient refused and stated she was breathing fine, Dr. Isidoro Donning notified

## 2014-05-14 ENCOUNTER — Inpatient Hospital Stay (HOSPITAL_COMMUNITY): Payer: 59

## 2014-05-15 ENCOUNTER — Ambulatory Visit: Payer: 59 | Admitting: Cardiology

## 2014-05-20 ENCOUNTER — Encounter (HOSPITAL_COMMUNITY): Payer: 59

## 2014-06-12 ENCOUNTER — Encounter (HOSPITAL_COMMUNITY): Payer: Self-pay | Admitting: *Deleted

## 2014-06-12 ENCOUNTER — Emergency Department (HOSPITAL_COMMUNITY): Payer: Medicaid - Out of State

## 2014-06-12 ENCOUNTER — Inpatient Hospital Stay (HOSPITAL_COMMUNITY)
Admission: EM | Admit: 2014-06-12 | Discharge: 2014-06-13 | DRG: 292 | Disposition: A | Discharge status: Home / Self Care | Payer: Medicaid - Out of State | Attending: Cardiology | Admitting: Cardiology

## 2014-06-12 DIAGNOSIS — I5023 Acute on chronic systolic (congestive) heart failure: Principal | ICD-10-CM

## 2014-06-12 DIAGNOSIS — I351 Nonrheumatic aortic (valve) insufficiency: Secondary | ICD-10-CM | POA: Diagnosis present

## 2014-06-12 DIAGNOSIS — I482 Chronic atrial fibrillation: Secondary | ICD-10-CM | POA: Diagnosis present

## 2014-06-12 DIAGNOSIS — I429 Cardiomyopathy, unspecified: Secondary | ICD-10-CM | POA: Diagnosis present

## 2014-06-12 DIAGNOSIS — Z794 Long term (current) use of insulin: Secondary | ICD-10-CM

## 2014-06-12 DIAGNOSIS — G40909 Epilepsy, unspecified, not intractable, without status epilepticus: Secondary | ICD-10-CM | POA: Diagnosis present

## 2014-06-12 DIAGNOSIS — Z9119 Patient's noncompliance with other medical treatment and regimen: Secondary | ICD-10-CM | POA: Diagnosis present

## 2014-06-12 DIAGNOSIS — E119 Type 2 diabetes mellitus without complications: Secondary | ICD-10-CM | POA: Diagnosis present

## 2014-06-12 DIAGNOSIS — L719 Rosacea, unspecified: Secondary | ICD-10-CM | POA: Diagnosis present

## 2014-06-12 DIAGNOSIS — Z91199 Patient's noncompliance with other medical treatment and regimen due to unspecified reason: Secondary | ICD-10-CM

## 2014-06-12 DIAGNOSIS — I4891 Unspecified atrial fibrillation: Secondary | ICD-10-CM

## 2014-06-12 LAB — COMPREHENSIVE METABOLIC PANEL
ALT: 12 U/L (ref 0–35)
AST: 17 U/L (ref 0–37)
Albumin: 3.5 g/dL (ref 3.5–5.2)
Alkaline Phosphatase: 45 U/L (ref 39–117)
Anion gap: 11 (ref 5–15)
BUN: 14 mg/dL (ref 6–23)
CO2: 24 mmol/L (ref 19–32)
Calcium: 8.7 mg/dL (ref 8.4–10.5)
Chloride: 101 mmol/L (ref 96–112)
Creatinine, Ser: 1.22 mg/dL — ABNORMAL HIGH (ref 0.50–1.10)
GFR calc Af Amer: 53 mL/min — ABNORMAL LOW (ref >90)
GFR calc non Af Amer: 46 mL/min — ABNORMAL LOW (ref >90)
Glucose, Bld: 375 mg/dL — ABNORMAL HIGH (ref 70–99)
Potassium: 3.8 mmol/L (ref 3.5–5.1)
Sodium: 136 mmol/L (ref 135–145)
Total Bilirubin: 1.2 mg/dL (ref 0.3–1.2)
Total Protein: 6.8 g/dL (ref 6.0–8.3)

## 2014-06-12 LAB — CBC WITH DIFFERENTIAL/PLATELET
Basophils Absolute: 0.1 10*3/uL (ref 0.0–0.1)
Basophils Relative: 1 % (ref 0–1)
Eosinophils Absolute: 0.4 10*3/uL (ref 0.0–0.7)
Eosinophils Relative: 3 % (ref 0–5)
HCT: 37.7 % (ref 36.0–46.0)
Hemoglobin: 12.3 g/dL (ref 12.0–15.0)
Lymphocytes Relative: 17 % (ref 12–46)
Lymphs Abs: 2.3 10*3/uL (ref 0.7–4.0)
MCH: 33.1 pg (ref 26.0–34.0)
MCHC: 32.6 g/dL (ref 30.0–36.0)
MCV: 101.3 fL — ABNORMAL HIGH (ref 78.0–100.0)
Monocytes Absolute: 0.8 10*3/uL (ref 0.1–1.0)
Monocytes Relative: 6 % (ref 3–12)
Neutro Abs: 10 10*3/uL — ABNORMAL HIGH (ref 1.7–7.7)
Neutrophils Relative %: 73 % (ref 43–77)
Platelets: 233 10*3/uL (ref 150–400)
RBC: 3.72 MIL/uL — ABNORMAL LOW (ref 3.87–5.11)
RDW: 16.6 % — ABNORMAL HIGH (ref 11.5–15.5)
WBC: 13.6 10*3/uL — ABNORMAL HIGH (ref 4.0–10.5)

## 2014-06-12 LAB — GLUCOSE, CAPILLARY
Glucose-Capillary: 250 mg/dL — ABNORMAL HIGH (ref 70–99)
Glucose-Capillary: 221 mg/dL — ABNORMAL HIGH (ref 70–99)

## 2014-06-12 LAB — HEMOGLOBIN A1C
Hgb A1c MFr Bld: 8.3 % — ABNORMAL HIGH (ref <5.7)
Mean Plasma Glucose: 192 mg/dL — ABNORMAL HIGH (ref <117)

## 2014-06-12 LAB — TSH: TSH: 3.829 u[IU]/mL (ref 0.350–4.500)

## 2014-06-12 LAB — TROPONIN I: Troponin I: <0.03 ng/mL (ref <0.031)

## 2014-06-12 LAB — CBG MONITORING, ED: Glucose-Capillary: 281 mg/dL — ABNORMAL HIGH (ref 70–99)

## 2014-06-12 LAB — DIGOXIN LEVEL: Digoxin Level: 0.3 ng/mL — ABNORMAL LOW (ref 0.8–2.0)

## 2014-06-12 LAB — BRAIN NATRIURETIC PEPTIDE: B Natriuretic Peptide: 542.1 pg/mL — ABNORMAL HIGH (ref 0.0–100.0)

## 2014-06-12 LAB — MRSA PCR SCREENING: MRSA by PCR: NEGATIVE

## 2014-06-12 MED ORDER — INSULIN ASPART 100 UNIT/ML ~~LOC~~ SOLN
0.0000 [IU] | Freq: Three times a day (TID) | SUBCUTANEOUS | Status: DC
  Administered 2014-06-12: 5 [IU] via SUBCUTANEOUS
  Administered 2014-06-12: 8 [IU] via SUBCUTANEOUS
  Administered 2014-06-13: 5 [IU] via SUBCUTANEOUS
  Administered 2014-06-13: 3 [IU] via SUBCUTANEOUS
  Filled 2014-06-12: qty 1

## 2014-06-12 MED ORDER — SODIUM CHLORIDE 0.9 % IJ SOLN
3.0000 mL | Freq: Two times a day (BID) | INTRAMUSCULAR | Status: DC
  Administered 2014-06-13: 3 mL via INTRAVENOUS

## 2014-06-12 MED ORDER — SODIUM CHLORIDE 0.9 % IV SOLN: 250.0000 mL | INTRAVENOUS | Status: DC | PRN

## 2014-06-12 MED ORDER — AMIODARONE HCL IN DEXTROSE 360-4.14 MG/200ML-% IV SOLN
60.0000 mg/h | INTRAVENOUS | Status: AC
  Administered 2014-06-12: 60 mg/h via INTRAVENOUS
  Filled 2014-06-12 (×2): qty 200

## 2014-06-12 MED ORDER — DIGOXIN 125 MCG PO TABS
0.1250 mg | ORAL_TABLET | Freq: Every day | ORAL | Status: DC
  Administered 2014-06-12 – 2014-06-13 (×2): 0.125 mg via ORAL
  Filled 2014-06-12 (×2): qty 1

## 2014-06-12 MED ORDER — SODIUM CHLORIDE 0.9 % IJ SOLN: 3.0000 mL | INTRAMUSCULAR | Status: DC | PRN

## 2014-06-12 MED ORDER — FUROSEMIDE 10 MG/ML IJ SOLN
40.0000 mg | Freq: Two times a day (BID) | INTRAMUSCULAR | Status: DC
  Administered 2014-06-13: 40 mg via INTRAVENOUS
  Filled 2014-06-12: qty 4

## 2014-06-12 MED ORDER — POTASSIUM CHLORIDE CRYS ER 20 MEQ PO TBCR
40.0000 meq | EXTENDED_RELEASE_TABLET | Freq: Every day | ORAL | Status: DC
Start: 2014-06-12 — End: 2014-06-13
  Administered 2014-06-13: 20 meq via ORAL
  Filled 2014-06-12 (×2): qty 2

## 2014-06-12 MED ORDER — DILTIAZEM HCL 100 MG IV SOLR
5.0000 mg/h | Freq: Once | INTRAVENOUS | Status: AC
  Administered 2014-06-12: 5 mg/h via INTRAVENOUS

## 2014-06-12 MED ORDER — AMIODARONE LOAD VIA INFUSION
150.0000 mg | Freq: Once | INTRAVENOUS | Status: AC
  Administered 2014-06-12: 150 mg via INTRAVENOUS
  Filled 2014-06-12: qty 83.34

## 2014-06-12 MED ORDER — GLYBURIDE 5 MG PO TABS
5.0000 mg | ORAL_TABLET | Freq: Every day | ORAL | Status: DC
  Administered 2014-06-13: 5 mg via ORAL
  Filled 2014-06-12 (×2): qty 1

## 2014-06-12 MED ORDER — METOPROLOL TARTRATE 50 MG PO TABS
75.0000 mg | ORAL_TABLET | Freq: Two times a day (BID) | ORAL | Status: DC
  Administered 2014-06-12 (×2): 75 mg via ORAL
  Filled 2014-06-12 (×4): qty 1

## 2014-06-12 MED ORDER — ACETAMINOPHEN 325 MG PO TABS: 650.0000 mg | ORAL_TABLET | ORAL | Status: DC | PRN

## 2014-06-12 MED ORDER — ONDANSETRON HCL 4 MG/2ML IJ SOLN: 4.0000 mg | Freq: Four times a day (QID) | INTRAMUSCULAR | Status: DC | PRN

## 2014-06-12 MED ORDER — AMIODARONE HCL IN DEXTROSE 360-4.14 MG/200ML-% IV SOLN
30.0000 mg/h | INTRAVENOUS | Status: DC
  Administered 2014-06-13: 30 mg/h via INTRAVENOUS
  Filled 2014-06-12: qty 200

## 2014-06-12 MED ORDER — METOPROLOL SUCCINATE ER 50 MG PO TB24: 75.0000 mg | ORAL_TABLET | Freq: Two times a day (BID) | ORAL | Status: DC

## 2014-06-12 MED ORDER — APIXABAN 5 MG PO TABS
5.0000 mg | ORAL_TABLET | Freq: Two times a day (BID) | ORAL | Status: DC
  Administered 2014-06-12 – 2014-06-13 (×3): 5 mg via ORAL
  Filled 2014-06-12 (×3): qty 1

## 2014-06-12 MED ORDER — INSULIN ASPART 100 UNIT/ML ~~LOC~~ SOLN
0.0000 [IU] | Freq: Every day | SUBCUTANEOUS | Status: DC
  Administered 2014-06-12: 2 [IU] via SUBCUTANEOUS

## 2014-06-12 MED ORDER — LISINOPRIL 5 MG PO TABS
5.0000 mg | ORAL_TABLET | Freq: Every day | ORAL | Status: DC
  Administered 2014-06-12 – 2014-06-13 (×2): 5 mg via ORAL
  Filled 2014-06-12 (×2): qty 1

## 2014-06-12 MED ORDER — DILTIAZEM HCL 25 MG/5ML IV SOLN
10.0000 mg | Freq: Once | INTRAVENOUS | Status: AC
  Administered 2014-06-12: 10 mg via INTRAVENOUS
  Filled 2014-06-12: qty 5

## 2014-06-12 NOTE — Progress Notes (Signed)
UR Completed Erman Thum Graves-Bigelow, RN,BSN [phone]  

## 2014-06-12 NOTE — H&P (Signed)
Physician History and Physical    Kaitlyn [name] MRN: [id number] DOB/AGE: 02-10-1951 64 y.o. Admit date: 06/12/2014  Primary Care Physician: None Primary Cardiologist: CHF clinic  HPI: 64 yo with history of chronic atrial fibrillation and chronic systolic CHF presents with atrial fibrillation with RVR and dyspnea.  Patient moved here from [place] about 3 months ago.  She has no family locally and no car.  She was initially seen in 11/15 with atrial fibrillation/RVR and acute on chronic systolic CHF after running out of meds.  She was diuresed and rate-controlled.  She did not want attempt at DCCV or cardiac cath.  She was sent home with meds but never followed up as outpatient due to transportation difficulties.  She was admitted again in 12/15 with atrial fibrillation/RVR and acute on chronic systolic CHF after running out of meds.  Again diuresed and rate-controlled.  Again, she did not followup as outpatient due to transportation issues.    Today, she tells me she has been out of her meds except Lasix x 4 days.  She felt her heart start racing and she has become short of breath with even mild exertion.  +Bendopnea.  No chest pain, no lightheadedness, no syncope.  In ER, HR in 150s with atrial fibrillation.  She was started on diltiazem gtt.   PMH:  1. Atrial fibrillation: Apparently diagnosed in 2012 in [place], sounds like it has been chronic/persistent.  Never cardioverted.  2. Type II diabetes 3. Seizure disorder 4. Rosacea 5. Chronic systolic CHF: Etiology uncertain.  Never had cath, cannot rule out tachy-mediated CMP.  Echo (11/15) with EF 15-20%, severe LV dilation, severe AI, mildly dilated RV.  6. Aortic insufficiency: Severe on 11/15 echo but do not hear prominent murmur.   Review of systems complete and found to be negative unless listed above   Family History  Problem Relation Age of Onset  . Diabetes Mellitus II Mother   . Alzheimer's disease Mother   . Pancreatic cancer Father       History   Social History  . Marital Status: Single    Spouse Name: N/A    Number of Children: N/A  . Years of Education: N/A   Occupational History  . Retired from [employer] and [employer]     Social History Main Topics  . Smoking status: Never Smoker   . Smokeless tobacco: Never Used  . Alcohol Use: No  . Drug Use: No  . Sexual Activity: Not Currently   Other Topics Concern  . Not on file   Social History Narrative   Lives alone.      Current Facility-Administered Medications  Medication Dose Route Frequency Provider Last Rate Last Dose  . amiodarone (NEXTERONE) 1.8 mg/mL load via infusion 150 mg  150 mg Intravenous Once Laurey Morale, MD       Followed by  . amiodarone (NEXTERONE PREMIX) 360 MG/200ML (1.8 mg/mL) IV infusion  60 mg/hr Intravenous Continuous Laurey Morale, MD       Followed by  . amiodarone (NEXTERONE PREMIX) 360 MG/200ML (1.8 mg/mL) IV infusion  30 mg/hr Intravenous Continuous Laurey Morale, MD       Current Outpatient Prescriptions  Medication Sig Dispense Refill  . apixaban (ELIQUIS) 5 MG TABS tablet Take 1 tablet (5 mg total) by mouth 2 (two) times daily. 60 tablet 4  . digoxin (LANOXIN) 0.125 MG tablet Take 1 tablet (0.125 mg total) by mouth daily. 30 tablet 4  . diltiazem (CARDIZEM CD) 120  MG 24 hr capsule Take 1 capsule (120 mg total) by mouth daily. 30 capsule 4  . furosemide (LASIX) 40 MG tablet Take 1 tablet (40 mg total) by mouth daily. 30 tablet 4  . glyBURIDE (DIABETA) 5 MG tablet Take 1 tablet (5 mg total) by mouth daily with breakfast. 30 tablet 4  . lisinopril (PRINIVIL,ZESTRIL) 5 MG tablet Take 1 tablet (5 mg total) by mouth daily. 30 tablet 4  . metoprolol (LOPRESSOR) 50 MG tablet Take 75 mg by mouth 2 (two) times daily.    . metoprolol succinate (TOPROL-XL) 25 MG 24 hr tablet Take 3 tablets (75 mg total) by mouth 2 (two) times daily. Take with or immediately following a meal. (Patient not taking: Reported on 06/12/2014) 180 tablet 4  .  potassium chloride SA (K-DUR,KLOR-CON) 20 MEQ tablet Take 1 tablet (20 mEq total) by mouth daily. (Patient not taking: Reported on 06/12/2014) 30 tablet 4    Physical Exam: Blood pressure 110/71, pulse 113, temperature 97.4 F (36.3 C), temperature source Oral, resp. rate 23, height 5\' 11"  (1.803 m), weight 288 lb (130.636 kg), SpO2 92 %.  General: NAD Neck: JVP 10-11 cm, no thyromegaly or thyroid nodule.  Lungs: Clear to auscultation bilaterally with normal respiratory effort. CV: Nondisplaced PMI.  Heart tachy, irregular S1/S2, no S3/S4, no murmur that I can hear.  1+ edema to knees bilaterally.  No carotid bruit.  Normal pedal pulses.  Abdomen: Soft, nontender, no hepatosplenomegaly, no distention.  Skin: Rosacea rash on face  Neurologic: Alert and oriented x 3.  Psych: Normal affect. Extremities: No clubbing or cyanosis.  HEENT: Normal.   Labs:   Lab Results  Component Value Date   WBC 13.6* 06/12/2014   HGB 12.3 06/12/2014   HCT 37.7 06/12/2014   MCV 101.3* 06/12/2014   PLT 233 06/12/2014    Recent Labs Lab 06/12/14 0620  NA 136  K 3.8  CL 101  CO2 24  BUN 14  CREATININE 1.22*  CALCIUM 8.7  PROT 6.8  BILITOT 1.2  ALKPHOS 45  ALT 12  AST 17  GLUCOSE 375*  TnI < 0.03 BNP 542  Radiology:  - CXR: Cardiomegaly, some pulmonary vascular congestion EKG: Atrial fibrillation, rate 155  ASSESSMENT AND PLAN: 64 yo with history of chronic atrial fibrillation and chronic systolic CHF presents with atrial fibrillation with RVR and dyspnea. 1. Atrial fibrillation: Chronic/persistent, possibly since 2012.  We discussed cardioversion and she is not interested in attempting this.  I suspect ability to keep her in NSR long-term would be limited anyway if she has been in atrial fibrillation since 2012.   - Rate control: would not use diltiazem gtt given cardiomyopathy and CHF.  Start with amiodarone gtt and will add back digoxin and Toprol XL.  Will stop amiodarone gtt once rate  control better.  - Restart Eliquis 5 mg bid.  2. Acute on chronic systolic CHF: EF 63-84% with severe LV dilation on 11/15 echo.  She is volume overloaded on exam in the setting of atrial fibrillation with RVR.  Etiology of CMP unknown: cannot rule out tachy-mediated cardiomyopathy, need to rate control.  As above, rhythm control is probably not going to be possible.  Have not ruled out ischemic cardiomyopathy.  Will discuss cardiac cath again with patient but she was reluctant to do this in the past.   - Toprol XL to be restarted.  - Restart lisinopril 5 mg daily.  - Lasix 40 mg IV bid for diuresis for now.  3. Aortic insufficiency: Severe by last echo but cannot hear significant murmur.  Will repeat limited echo to assess aortic valve.  4. Diabetes: Continue home meds with sliding scale.   Signed: [name] 06/12/2014, 9:26 AM

## 2014-06-12 NOTE — ED Notes (Signed)
Pt. Has a hx of A.fib and ran out of her medication 4 days ago. Pt. States she has felt like her heart is racing for 3 days with some SOB.
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
## 2014-06-12 NOTE — Care Management Note (Addendum)
    Page 1 of 2   06/13/2014     11:26:03 AM CARE MANAGEMENT NOTE 06/13/2014  Patient:  Kaitlyn [name], Kaitlyn [name]   Account Number:  [id number]  Date Initiated:  06/12/2014  Documentation initiated by:  GRAVES-BIGELOW,Noralyn Karim  Subjective/Objective Assessment:   Pt admitted for afib, Initiated on cardizem gtt. Pt currently has Medicaid listed out of state. Per notes pt was having issues with purchasing medications.     Action/Plan:   CM to monitor for disposition needs.   Anticipated DC Date:  06/14/2014   Anticipated DC Plan:  HOME/SELF CARE      DC Planning Services  CM consult  Patient refused services      Choice offered to / List presented to:             Status of service:  Completed, signed off Medicare Important Message given?  NO (If response is "NO", the following Medicare IM given date fields will be blank) Date Medicare IM given:   Medicare IM given by:   Date Additional Medicare IM given:   Additional Medicare IM given by:    Discharge Disposition:  HOME/SELF CARE  Per UR Regulation:  Reviewed for med. necessity/level of care/duration of stay  If discussed at Long Length of Stay Meetings, dates discussed:    Comments:  06-13-14 1117 Tomi Bamberger, RN, BSN [phone] CM did have a discussion with pt about hospital f/u within 1-2 weeks of d/c. Pt stated she will not do that and will f/u once her 30 day supply of Rx runs out. CM stated that it would be up to the MD to write Rx's. CM discussed with pt that she has had several admissions in the past and she is still showing up Medic[place]ad out of [place]- she has had ample time to get that changed to Medicaid [place]. CM stated that pt has to be accountalbe for care as well. Pt got upset with CM and stated that she will get the Rx's for 30 day supply before she leaves and will not have to do a hospital f/u even if the transportation is set up and asked for CM to leave the room. Pt may need Resources to Psych outpatient if MD agrees.  Vesta Mixer would be a great Resource: Address: [address], [place], [place] [address] [phone]  06-13-14 [address], [place] [phone] [facility] will be Pharmacy of choice for pt to get Rx's delivered to home. Pt is without transportation to get to MD appointments. CM did call [facility] and she will assist with transportation to and from HF Clinic. CM did also make CSW aware of transportation needs and she will provide resources. Pt was given a scale last admission, however she admits to not weighing daily. CM did suggest benefits from [facility] and pt is refusing [facility]. CM did some teaching in reference to HF. Pt did ask for pill box. CM did call to main pharmacy and pill box not available in pharmacy. However, CM did call [facility] to see if HF Clinic has pill box. No further needs from CM at this time.

## 2014-06-13 ENCOUNTER — Other Ambulatory Visit (HOSPITAL_COMMUNITY): Payer: Self-pay

## 2014-06-13 DIAGNOSIS — I350 Nonrheumatic aortic (valve) stenosis: Secondary | ICD-10-CM

## 2014-06-13 LAB — URINALYSIS, ROUTINE W REFLEX MICROSCOPIC
Bilirubin Urine: NEGATIVE
Glucose, UA: NEGATIVE mg/dL
Ketones, ur: NEGATIVE mg/dL
Nitrite: NEGATIVE
Protein, ur: NEGATIVE mg/dL
Specific Gravity, Urine: 1.01 (ref 1.005–1.030)
Urobilinogen, UA: 0.2 mg/dL (ref 0.0–1.0)
pH: 5 (ref 5.0–8.0)

## 2014-06-13 LAB — GLUCOSE, CAPILLARY
Glucose-Capillary: 205 mg/dL — ABNORMAL HIGH (ref 70–99)
Glucose-Capillary: 173 mg/dL — ABNORMAL HIGH (ref 70–99)

## 2014-06-13 LAB — URINE MICROSCOPIC-ADD ON

## 2014-06-13 LAB — BASIC METABOLIC PANEL
Anion gap: 6 (ref 5–15)
BUN: 16 mg/dL (ref 6–23)
CO2: 25 mmol/L (ref 19–32)
Calcium: 9.1 mg/dL (ref 8.4–10.5)
Chloride: 103 mmol/L (ref 96–112)
Creatinine, Ser: 1.15 mg/dL — ABNORMAL HIGH (ref 0.50–1.10)
GFR calc Af Amer: 57 mL/min — ABNORMAL LOW (ref >90)
GFR calc non Af Amer: 49 mL/min — ABNORMAL LOW (ref >90)
Glucose, Bld: 239 mg/dL — ABNORMAL HIGH (ref 70–99)
Potassium: 4.2 mmol/L (ref 3.5–5.1)
Sodium: 134 mmol/L — ABNORMAL LOW (ref 135–145)

## 2014-06-13 MED ORDER — POTASSIUM CHLORIDE CRYS ER 20 MEQ PO TBCR: 20.0000 meq | EXTENDED_RELEASE_TABLET | Freq: Every day | ORAL | Status: DC

## 2014-06-13 MED ORDER — METOPROLOL SUCCINATE ER 100 MG PO TB24: 100.0000 mg | ORAL_TABLET | Freq: Two times a day (BID) | ORAL | Status: DC

## 2014-06-13 MED ORDER — DIGOXIN 125 MCG PO TABS: 0.1250 mg | ORAL_TABLET | Freq: Every day | ORAL | Status: DC

## 2014-06-13 MED ORDER — GLYBURIDE 5 MG PO TABS: 5.0000 mg | ORAL_TABLET | Freq: Every day | ORAL | Status: DC

## 2014-06-13 MED ORDER — LISINOPRIL 5 MG PO TABS: 5.0000 mg | ORAL_TABLET | Freq: Every day | ORAL | Status: DC

## 2014-06-13 MED ORDER — METOPROLOL SUCCINATE ER 100 MG PO TB24
100.0000 mg | ORAL_TABLET | Freq: Two times a day (BID) | ORAL | Status: DC
  Administered 2014-06-13: 100 mg via ORAL
  Filled 2014-06-13: qty 1

## 2014-06-13 MED ORDER — APIXABAN 5 MG PO TABS: 5.0000 mg | ORAL_TABLET | Freq: Two times a day (BID) | ORAL | Status: DC

## 2014-06-13 MED ORDER — METOPROLOL SUCCINATE ER 50 MG PO TB24: 75.0000 mg | ORAL_TABLET | Freq: Two times a day (BID) | ORAL | Status: DC

## 2014-06-13 MED ORDER — FUROSEMIDE 40 MG PO TABS: 40.0000 mg | ORAL_TABLET | Freq: Every day | ORAL | Status: DC

## 2014-06-13 NOTE — Progress Notes (Signed)
CSW Proofreader) spoke with pt about transportation issues. Pt informed CSW she is aware of resources. Pt asking for non-emergent ambulance transport home. Pt not comfortable taking a taxi and walking independently from taxi to her apartment. CSW did inform pt that she will likely be billed for transport. Pt understanding and still requesting transport. CSW scheduled non-emergent ambulance pick up. Pt nurse notified. Pt has no further hospital social work needs.  Briley Sulton, LCSWA [phone]

## 2014-06-13 NOTE — Progress Notes (Signed)
  Echocardiogram 2D Echocardiogram has been performed.  Arvil Chaco 06/13/2014, 1:39 PM

## 2014-06-13 NOTE — Progress Notes (Signed)
I stopped in to see Kaitlyn [name].  She is known to me from previous admissions.  She recently relocated from [place].  She also recently ran out of her medications and tells me that she lost her phone service--and therefore was unable to call for refill.  She does not have a car/transportation which is the reason she gives for not having any outpatient follow-up.  I offered to provide transportation to the AHF clinic when she was here in November and she refused saying "I'll work it out".  I informed her that we would like her to follow-up in the Advanced Heart Failure clinic within 1 week from discharge today.  She tells me that she "will not do that".  "You all cannot hover over me like that".  " I can come in a month or so".  She seems to equate the MD visits with opportunity to get med refills.  I explained that we would like to see her outpatient to help keep her well and out of the hospital.  She says that she can "stay out if she can keep her meds refilled".  She has an appt in the AHF for Feb 4 at 0940.  She tells me that she will cancel this appt and make another appt for later.  I encouraged her to please keep the appt and that future appts would be scheduled out further.

## 2014-06-13 NOTE — Progress Notes (Signed)
Pharmacist HF Discharge Medication Education   Patient was educated on process for home medication reconciliation and provided written instructions as well. Patient was also counseled on HF medications and changes to be made upon arriving home. She did express concerns about her ability to take potassium supplementation due to taste aversion. We discussed importance of consistent use with concomitant lasix and methods for mixing with juices if needed. She was provided with the "Living Better with Heart Failure" patient education packet and a medication bag to be brought with her to her follow up clinic visits with her current medications.   Tyler Deis. Bonnye Fava, PharmD Clinical Pharmacist - Resident Pager: [phone] Pharmacy: [phone] 06/13/2014 10:18 AM

## 2014-06-13 NOTE — Progress Notes (Signed)
Inpatient Diabetes Program Recommendations  AACE/ADA: New Consensus Statement on Inpatient Glycemic Control (2013)  Target Ranges:  Prepandial:   less than 140 mg/dL      Peak postprandial:   less than 180 mg/dL (1-2 hours)      Critically ill patients:  140 - 180 mg/dL   Reason for assessment: elevated CBG- fasting 205mg /dl  Diabetes history: Type 2 Outpatient Diabetes medications: notes indicated she has not been taking meds- Glyburide 5mg  qam Current orders for Inpatient glycemic control: Glyburide 5mg /day, Novolog 0-15 units tid with meals, Novolog 0-5 qhs  MD note states the patient is anxious to go home today. If it is not contraindicated this patient may benefit from 500mg  Metformin bid in addition to the Glyburide 5mg  qam.   Susette Racer, RN, BA, MHA, CDE Diabetes Coordinator Inpatient Diabetes Program  [phone] (Team Pager) [phone] [facility]) 06/13/2014 9:59 AM

## 2014-06-13 NOTE — Discharge Instructions (Signed)

## 2014-06-13 NOTE — Discharge Summary (Signed)
Advanced Heart Failure Team  Discharge Summary   Patient ID: Kaitlyn [name] MRN: [id number], DOB/AGE: 1951-01-29 64 y.o. Admit date: 06/12/2014 D/C date:     06/13/2014   Primary Discharge Diagnoses:  1. A fib -Chronic 2. A/C Systolic HF 3. Aortic Insufficiency 4. DM 5. Noncompliance.    Hospital Course:  Kaitlyn [name] is a 64 year old with history of chronic atrial fibrillation and chronic systolic CHF, noncompliance. Patient moved here from [place] about 3 months ago. She has no family locally and no car. She was initially seen in 11/15 with atrial fibrillation/RVR and acute on chronic systolic CHF after running out of meds. She was diuresed and rate-controlled. She did not want attempt at DCCV or cardiac cath. She was sent home with meds but never followed up as outpatient due to transportation difficulties. She was admitted again in 12/15 with atrial fibrillation/RVR and acute on chronic systolic CHF after running out of meds. Again diuresed and rate-controlled. Again, she did not follow up as outpatient due to transportation issues.   She was again readmitted 06/12/14  with atrial fibrillation/RVR and acute on chronic systolic CHF after running out of her meds. She was started on IV amiodarone and Toprol Xl  and achieved adequate rate control. She again refused cardioversion as well cardiac cath.   Attempts were made to diurese with IV lasix however she refused. She was restarted on HF meds and provided with a 30 day supply. Lengthy discussions occurred regarding medication compliance and follow up. Offered to [facility] however she refused. She has an appointment in the HF clinic next week. She remains at high risk for readmits due to ongoing noncompliance and poor insight as it relates to heart failure.   Discharge Weight Range:287 pounds  Discharge Vitals: Blood pressure 114/86, pulse 100, temperature 97.7 F (36.5 C), temperature source Oral, resp. rate 20, height 5\' 11"  (1.803 m), weight  287 lb 1.6 oz (130.228 kg), SpO2 99 %.  Labs: Lab Results  Component Value Date   WBC 13.6* 06/12/2014   HGB 12.3 06/12/2014   HCT 37.7 06/12/2014   MCV 101.3* 06/12/2014   PLT 233 06/12/2014     Recent Labs Lab 06/12/14 0620 06/13/14 0600  NA 136 134*  K 3.8 4.2  CL 101 103  CO2 24 25  BUN 14 16  CREATININE 1.22* 1.15*  CALCIUM 8.7 9.1  PROT 6.8  --   BILITOT 1.2  --   ALKPHOS 45  --   ALT 12  --   AST 17  --   GLUCOSE 375* 239*   Lab Results  Component Value Date   CHOL 104 03/23/2014   HDL 36* 03/23/2014   LDLCALC 49 03/23/2014   TRIG 93 03/23/2014   BNP (last 3 results)  Recent Labs  03/21/14 2126 03/22/14 1312 04/27/14 0128  PROBNP 2946.0* 3181.0* 5351.0*    Diagnostic Studies/Procedures   Dg Chest Port 1 View  06/12/2014   CLINICAL DATA:  64 year old female with shortness of breath, atrial fibrillation, tachycardia. Initial encounter.  EXAM: PORTABLE CHEST - 1 VIEW  COMPARISON:  05/07/2014 and earlier.  FINDINGS: Portable AP view at 0640 hrs. Mildly lower lung volumes. Stable cardiomegaly and mediastinal contours. Visualized tracheal air column is within normal limits. Interval decreased pulmonary vascularity, and improved ventilation of both lung bases. No pneumothorax, pleural effusion or consolidation identified.  IMPRESSION: Cardiomegaly with interval decreased pulmonary vascular congestion and improved bibasilar ventilation.   Electronically Signed   By: Augusto Gamble  M.D.   On: 06/12/2014 06:58    Discharge Medications     Medication List    STOP taking these medications        diltiazem 120 MG 24 hr capsule  Commonly known as:  CARDIZEM CD     metoprolol 50 MG tablet  Commonly known as:  LOPRESSOR      TAKE these medications        apixaban 5 MG Tabs tablet  Commonly known as:  ELIQUIS  Take 1 tablet (5 mg total) by mouth 2 (two) times daily.     digoxin 0.125 MG tablet  Commonly known as:  LANOXIN  Take 1 tablet (0.125 mg total) by  mouth daily.     furosemide 40 MG tablet  Commonly known as:  LASIX  Take 1 tablet (40 mg total) by mouth daily.     glyBURIDE 5 MG tablet  Commonly known as:  DIABETA  Take 1 tablet (5 mg total) by mouth daily with breakfast.     lisinopril 5 MG tablet  Commonly known as:  PRINIVIL,ZESTRIL  Take 1 tablet (5 mg total) by mouth daily.     metoprolol succinate 100 MG 24 hr tablet  Commonly known as:  TOPROL-XL  Take 1 tablet (100 mg total) by mouth 2 (two) times daily. Take with or immediately following a meal.     potassium chloride SA 20 MEQ tablet  Commonly known as:  K-DUR,KLOR-CON  Take 1 tablet (20 mEq total) by mouth daily.        Disposition   The patient will be discharged in stable condition to home. Discharge Instructions    Contraindication to ACEI at discharge    Complete by:  As directed      Diet - low sodium heart healthy    Complete by:  As directed      Heart Failure patients record your daily weight using the same scale at the same time of day    Complete by:  As directed      Increase activity slowly    Complete by:  As directed           Follow-up Information    Follow up with [name], MD On 06/20/2014.   Specialty:  Cardiology   Why:  at 9:40 garage code [id number]   Contact information:   [address]  [address] [place] [address] [phone]         Duration of Discharge Encounter: Greater than 35 minutes   Signed, Jediah Horger Np-C  06/13/2014, 12:25 PM

## 2014-06-13 NOTE — Progress Notes (Signed)
Patient ID: Kaitlyn [name], female   DOB: 10-Jun-1950, 64 y.o.   MRN: [id number]    SUBJECTIVE: 64 yo with history of chronic atrial fibrillation and chronic systolic CHF presented with atrial fibrillation with RVR and dyspnea. Patient moved here from [place] about 3 months ago. She has no family locally and no car. She was initially seen in 11/15 with atrial fibrillation/RVR and acute on chronic systolic CHF after running out of meds. She was diuresed and rate-controlled. She did not want attempt at DCCV or cardiac cath. She was sent home with meds but never followed up as outpatient due to transportation difficulties. She was admitted again in 12/15 with atrial fibrillation/RVR and acute on chronic systolic CHF after running out of meds. Again diuresed and rate-controlled. Again, she did not followup as outpatient due to transportation issues.   She was admitted 1/27 with atrial fibrillation/RVR and acute on chronic systolic CHF after running out of her meds. She again did not followup after discharge in 12/15.   No diuresis yesterday evening because patient refused Lasix (did not want to have to go to the bathroom during the night).  HR 100 currently, atrial fibrillation.  Patient says she feels much better. She insists that she must go home today.   Scheduled Meds: . apixaban  5 mg Oral BID  . digoxin  0.125 mg Oral Daily  . furosemide  40 mg Intravenous BID  . glyBURIDE  5 mg Oral Q breakfast  . insulin aspart  0-15 Units Subcutaneous TID WC  . insulin aspart  0-5 Units Subcutaneous QHS  . lisinopril  5 mg Oral Daily  . metoprolol succinate  100 mg Oral BID  . potassium chloride SA  40 mEq Oral Daily  . sodium chloride  3 mL Intravenous Q12H   Continuous Infusions: . amiodarone 30 mg/hr (06/13/14 0007)   PRN Meds:.sodium chloride, acetaminophen, ondansetron (ZOFRAN) IV, sodium chloride    Filed Vitals:   06/12/14 2000 06/13/14 0000 06/13/14 0400 06/13/14 0728  BP: 110/79 111/77  124/80 106/81  Pulse: 102 99 93 99  Temp: 97.8 F (36.6 C) 98.2 F (36.8 C) 97.7 F (36.5 C) 97.7 F (36.5 C)  TempSrc: Oral Oral Oral Oral  Resp: 16 24  20   Height:      Weight:   287 lb 1.6 oz (130.228 kg)   SpO2: 95% 98% 97% 96%    Intake/Output Summary (Last 24 hours) at 06/13/14 0742 Last data filed at 06/13/14 0200  Gross per 24 hour  Intake 559.28 ml  Output    300 ml  Net 259.28 ml    LABS: Basic Metabolic Panel:  Recent Labs  84/69/62 0620 06/13/14 0600  NA 136 134*  K 3.8 4.2  CL 101 103  CO2 24 25  GLUCOSE 375* 239*  BUN 14 16  CREATININE 1.22* 1.15*  CALCIUM 8.7 9.1   Liver Function Tests:  Recent Labs  06/12/14 0620  AST 17  ALT 12  ALKPHOS 45  BILITOT 1.2  PROT 6.8  ALBUMIN 3.5   No results for input(s): LIPASE, AMYLASE in the last 72 hours. CBC:  Recent Labs  06/12/14 0620  WBC 13.6*  NEUTROABS 10.0*  HGB 12.3  HCT 37.7  MCV 101.3*  PLT 233   Cardiac Enzymes:  Recent Labs  06/12/14 0620  TROPONINI <0.03   BNP: Invalid input(s): POCBNP D-Dimer: No results for input(s): DDIMER in the last 72 hours. Hemoglobin A1C:  Recent Labs  06/12/14 1122  HGBA1C  8.3*   Fasting Lipid Panel: No results for input(s): CHOL, HDL, LDLCALC, TRIG, CHOLHDL, LDLDIRECT in the last 72 hours. Thyroid Function Tests:  Recent Labs  06/12/14 1122  TSH 3.829   Anemia Panel: No results for input(s): VITAMINB12, FOLATE, FERRITIN, TIBC, IRON, RETICCTPCT in the last 72 hours.  RADIOLOGY: Dg Chest Port 1 View  06/12/2014   CLINICAL DATA:  64 year old female with shortness of breath, atrial fibrillation, tachycardia. Initial encounter.  EXAM: PORTABLE CHEST - 1 VIEW  COMPARISON:  05/07/2014 and earlier.  FINDINGS: Portable AP view at 0640 hrs. Mildly lower lung volumes. Stable cardiomegaly and mediastinal contours. Visualized tracheal air column is within normal limits. Interval decreased pulmonary vascularity, and improved ventilation of both  lung bases. No pneumothorax, pleural effusion or consolidation identified.  IMPRESSION: Cardiomegaly with interval decreased pulmonary vascular congestion and improved bibasilar ventilation.   Electronically Signed   By: Augusto Gamble M.D.   On: 06/12/2014 06:58    PHYSICAL EXAM General: NAD Neck: JVP 8-9 cm, no thyromegaly or thyroid nodule.  Lungs: Clear to auscultation bilaterally with normal respiratory effort. CV: Nondisplaced PMI.  Heart mildly tachy, irregular S1/S2, no S3/S4, no murmur.  1+ ankle edema.  No carotid bruit.  Normal pedal pulses.  Abdomen: Soft, nontender, no hepatosplenomegaly, no distention.  Neurologic: Alert and oriented x 3.  Psych: Normal affect. Extremities: No clubbing or cyanosis.   TELEMETRY: Reviewed telemetry pt in atrial fibrillation, rate around 100  ASSESSMENT AND PLAN: 64 yo with history of chronic atrial fibrillation and chronic systolic CHF presented with atrial fibrillation with RVR and dyspnea. 1. Atrial fibrillation: Chronic/persistent, possibly since 2012. We discussed cardioversion and she is not interested in attempting this. I suspect ability to keep her in NSR long-term would be limited anyway if she has been in atrial fibrillation since 2012.  - Rate control: would not use diltiazem gtt given cardiomyopathy and CHF. She is on amiodarone gtt for acute rate control. - She will get digoxin and Toprol XL 100 mg bid.  Will work on weaning off amiodarone gtt this afternoon.  - Restarted Eliquis 5 mg bid.  2. Acute on chronic systolic CHF: EF 16-10% with severe LV dilation on 11/15 echo. She is volume overloaded on exam in the setting of atrial fibrillation with RVR. Etiology of CMP unknown: cannot rule out tachy-mediated cardiomyopathy, need to rate control. As above, rhythm control is probably not going to be possible. Have not ruled out ischemic cardiomyopathy. Will discuss cardiac cath again with patient but she was reluctant to do this in  the past.  - Toprol XL restarted.  - Lisinopril 5 mg daily restarted.  - She says that she will only take Lasix 40 mg IV once a day.  Will give her a dose this morning.  3. Aortic insufficiency: Severe by last echo but cannot hear significant murmur. Will repeat limited echo to assess aortic valve, awaiting.  4. Diabetes: Continue home meds with sliding scale.  5. Disposition: Patient is fairly insistent about going home today.  Will give her IV Lasix and make sure her HR is controlled off amiodarone gtt (turn off around noon).  If she's doing ok, will then let her go home.  She promises she will followup this time. Needs social work to help with transportation.  Home meds: Toprol XL 100 mg bid, digoxin 0.125 daily, Lasix 40 daily, KCl 20 daily, lisinopril 5 daily, Eliquis 5 bid, home diabetes meds.  I agreed to give her a 34  day supply of meds.  Followup 1 week CHF clinic, will need transportation.   [name] 06/13/2014 7:47 AM

## 2014-06-13 NOTE — Evaluation (Signed)
Physical Therapy Evaluation and D/C Patient Details Name: Kaitlyn [name] MRN: [id number] DOB: [birth year] Today's Date: 06/13/2014   History of Present Illness  Pt admit with CHF and afib.    Clinical Impression  Pt admitted with above diagnosis. Pt currently without significant functional limitations at this time.  Pt states she is at her baseline and is functioning independently in her room.  Does not want f/u therapy or a safety eval.  Will not follow due to pt not agreeable and does appear to probably be at baseline.  Thanks.      Follow Up Recommendations No PT follow up (pt declines need for PT in hospital or at home)    Equipment Recommendations  None recommended by PT    Recommendations for Other Services       Precautions / Restrictions Precautions Precautions: Fall Restrictions Weight Bearing Restrictions: No      Mobility  Bed Mobility Overal bed mobility: Independent                Transfers Overall transfer level: Independent               General transfer comment: Stood pivot to recliner safely without equipment. Pt uses furniture.  Ambulation/Gait Ambulation/Gait assistance: Supervision Ambulation Distance (Feet): 5 Feet Assistive device: Rolling walker (2 wheeled);None Gait Pattern/deviations: Step-through pattern;Decreased stride length   Gait velocity interpretation: Below normal speed for age/gender General Gait Details: Pt states she has no problems walking and her apartment is only three rooms and she has very little distance to travel at home.  Pt states she is safe and appears to be safe with the evaluation she allowed by this PT.    Stairs            Wheelchair Mobility    Modified Rankin (Stroke Patients Only)       Balance Overall balance assessment: Needs assistance         Standing balance support: No upper extremity supported;During functional activity Standing balance-Leahy Scale: Fair Standing balance  comment: can stand statically without UE support.                              Pertinent Vitals/Pain Pain Assessment: No/denies pain  VSS    Home Living Family/patient expects to be discharged to:: Private residence Living Arrangements: Alone   Type of Home: Apartment Home Access: Level entry     Home Layout: One level Home Equipment: Walker - 2 wheels (uses tub beside toilet to push up on per pt)      Prior Function Level of Independence: Independent with assistive device(s);Needs assistance   Gait / Transfers Assistance Needed: ambulates with RW with modif I  ADL's / Homemaking Assistance Needed: sponge bathes, HT delivers her groceries.          Hand Dominance        Extremity/Trunk Assessment   Upper Extremity Assessment: Defer to OT evaluation           Lower Extremity Assessment: Generalized weakness      Cervical / Trunk Assessment: Normal  Communication   Communication: No difficulties  Cognition Arousal/Alertness: Awake/alert Behavior During Therapy: WFL for tasks assessed/performed Overall Cognitive Status: Within Functional Limits for tasks assessed                      General Comments General comments (skin integrity, edema, etc.): Pt got up to recliner and was  going to have her eat sitting in recliner as her bed was dirty under where she was sitting.  Pt got upset and said she did not want to sit in the chair so this PT said that was fine and offered to place a bed pad over the dirty sheet or change the sheet.  Pt states that was stupid because she is leaving in a few hours and proceeded to get back on the dirty sheet.  This PT moved pts lunch on tray table to pt so she could eat as she requested.      Exercises        Assessment/Plan    PT Assessment Patent does not need any further PT services  PT Diagnosis Generalized weakness   PT Problem List    PT Treatment Interventions Functional mobility training   PT Goals  (Current goals can be found in the Care Plan section) Acute Rehab PT Goals PT Goal Formulation: All assessment and education complete, DC therapy    Frequency     Barriers to discharge Decreased caregiver support      Co-evaluation               End of Session Equipment Utilized During Treatment: Gait belt Activity Tolerance:  (self limiting ) Patient left: in bed;with call bell/phone within reach Nurse Communication: Mobility status         Time: 1211-1224 PT Time Calculation (min) (ACUTE ONLY): 13 min   Charges:   PT Evaluation $Initial PT Evaluation Tier I: 1 Procedure     PT G CodesBerline Lopes Jul 09, 2014, 12:45 PM Johara Lodwick [facility] [phone] (pager)

## 2014-06-15 NOTE — ED Provider Notes (Signed)
Medical screening examination/treatment/procedure(s) were conducted as a shared visit with non-physician practitioner(s) or resident and myself. I personally evaluated the patient during the encounter and agree with the findings.  I have personally reviewed any xrays and/ or EKG's with the provider and I agree with interpretation.  Patient with history of atrial fibrillation on Eliquis, digoxin, diltiazem with congestive heart failure history on Lasix and metoprolol presents with palpitations and mild shortness of breath. Clinically patient atrial fibrillation with rapid ventricle response. Patient has not been taking her medicines she ran out 4 days prior. Patient denies any current shortness breath or chest pain on exam, lungs are clear, heart rate tachycardic irregular. EKG reviewed. Diltiazem bolus and drip with plan for admission for further control of her heart rate.  Atrial fibrillation with rapid ventricular response, dyspnea     Enid Skeens, MD 06/15/14 [phone]

## 2014-06-18 ENCOUNTER — Emergency Department (HOSPITAL_COMMUNITY): Payer: Medicaid - Out of State

## 2014-06-18 ENCOUNTER — Emergency Department (HOSPITAL_COMMUNITY)
Admission: EM | Admit: 2014-06-18 | Discharge: 2014-06-18 | Disposition: A | Discharge status: Home / Self Care | Payer: Medicaid - Out of State | Attending: Emergency Medicine | Admitting: Emergency Medicine

## 2014-06-18 ENCOUNTER — Encounter (HOSPITAL_COMMUNITY): Payer: Self-pay | Admitting: Emergency Medicine

## 2014-06-18 DIAGNOSIS — I10 Essential (primary) hypertension: Secondary | ICD-10-CM | POA: Insufficient documentation

## 2014-06-18 DIAGNOSIS — Z9119 Patient's noncompliance with other medical treatment and regimen: Secondary | ICD-10-CM | POA: Insufficient documentation

## 2014-06-18 DIAGNOSIS — Z880 Allergy status to penicillin: Secondary | ICD-10-CM | POA: Insufficient documentation

## 2014-06-18 DIAGNOSIS — Z91199 Patient's noncompliance with other medical treatment and regimen due to unspecified reason: Secondary | ICD-10-CM

## 2014-06-18 DIAGNOSIS — Z8739 Personal history of other diseases of the musculoskeletal system and connective tissue: Secondary | ICD-10-CM | POA: Insufficient documentation

## 2014-06-18 DIAGNOSIS — I482 Chronic atrial fibrillation, unspecified: Secondary | ICD-10-CM

## 2014-06-18 DIAGNOSIS — Z79899 Other long term (current) drug therapy: Secondary | ICD-10-CM | POA: Insufficient documentation

## 2014-06-18 DIAGNOSIS — I509 Heart failure, unspecified: Secondary | ICD-10-CM | POA: Insufficient documentation

## 2014-06-18 DIAGNOSIS — E119 Type 2 diabetes mellitus without complications: Secondary | ICD-10-CM | POA: Insufficient documentation

## 2014-06-18 LAB — CBC WITH DIFFERENTIAL/PLATELET
Basophils Absolute: 0.1 10*3/uL (ref 0.0–0.1)
Basophils Relative: 1 % (ref 0–1)
Eosinophils Absolute: 0.5 10*3/uL (ref 0.0–0.7)
Eosinophils Relative: 4 % (ref 0–5)
HCT: 37.2 % (ref 36.0–46.0)
Hemoglobin: 12 g/dL (ref 12.0–15.0)
Lymphocytes Relative: 12 % (ref 12–46)
Lymphs Abs: 1.3 10*3/uL (ref 0.7–4.0)
MCH: 33.3 pg (ref 26.0–34.0)
MCHC: 32.3 g/dL (ref 30.0–36.0)
MCV: 103.3 fL — ABNORMAL HIGH (ref 78.0–100.0)
Monocytes Absolute: 0.6 10*3/uL (ref 0.1–1.0)
Monocytes Relative: 5 % (ref 3–12)
Neutro Abs: 8.8 10*3/uL — ABNORMAL HIGH (ref 1.7–7.7)
Neutrophils Relative %: 78 % — ABNORMAL HIGH (ref 43–77)
Platelets: 195 10*3/uL (ref 150–400)
RBC: 3.6 MIL/uL — ABNORMAL LOW (ref 3.87–5.11)
RDW: 16.4 % — ABNORMAL HIGH (ref 11.5–15.5)
WBC: 11.3 10*3/uL — ABNORMAL HIGH (ref 4.0–10.5)

## 2014-06-18 LAB — COMPREHENSIVE METABOLIC PANEL
ALT: 14 U/L (ref 0–35)
AST: 18 U/L (ref 0–37)
Albumin: 3.6 g/dL (ref 3.5–5.2)
Alkaline Phosphatase: 43 U/L (ref 39–117)
Anion gap: 5 (ref 5–15)
BUN: 15 mg/dL (ref 6–23)
CO2: 25 mmol/L (ref 19–32)
Calcium: 8.8 mg/dL (ref 8.4–10.5)
Chloride: 107 mmol/L (ref 96–112)
Creatinine, Ser: 1.06 mg/dL (ref 0.50–1.10)
GFR calc Af Amer: 63 mL/min — ABNORMAL LOW (ref >90)
GFR calc non Af Amer: 54 mL/min — ABNORMAL LOW (ref >90)
Glucose, Bld: 267 mg/dL — ABNORMAL HIGH (ref 70–99)
Potassium: 3.9 mmol/L (ref 3.5–5.1)
Sodium: 137 mmol/L (ref 135–145)
Total Bilirubin: 1.4 mg/dL — ABNORMAL HIGH (ref 0.3–1.2)
Total Protein: 6.7 g/dL (ref 6.0–8.3)

## 2014-06-18 LAB — BRAIN NATRIURETIC PEPTIDE: B Natriuretic Peptide: 904.1 pg/mL — ABNORMAL HIGH (ref 0.0–100.0)

## 2014-06-18 LAB — TROPONIN I: Troponin I: 0.04 ng/mL — ABNORMAL HIGH (ref <0.031)

## 2014-06-18 LAB — CBG MONITORING, ED: Glucose-Capillary: 238 mg/dL — ABNORMAL HIGH (ref 70–99)

## 2014-06-18 LAB — DIGOXIN LEVEL: Digoxin Level: 0.5 ng/mL — ABNORMAL LOW (ref 0.8–2.0)

## 2014-06-18 NOTE — ED Notes (Signed)
Patient waiting on a cab voucher from [employer].  Spoke with Child psychotherapist regarding following up with patient at home regarding resources.

## 2014-06-18 NOTE — ED Provider Notes (Signed)
8:01 AM Pt is overall well appearing. Rate controlled at this time. No signs of CHF. Long standing hx of noncompliance and refusing care recommendations while in the hospital.  Patient be discharged home.  Have asked that she call the cardiology clinic for follow-up this week.  She canceled her appointment are ready for later this week.  I do not think she needs hospitalization at this time.  She does not need cardiology consultation.  She needs to be compliant with her medications and follow-up as directed with her primary care physician and her cardiologist.  Lyanne Co, MD 06/18/14 [phone]

## 2014-06-18 NOTE — ED Notes (Signed)
[id number]  

## 2014-06-18 NOTE — Discharge Instructions (Signed)
Please call your doctor for a followup appointment within 24-48 hours. When you talk to your doctor please let them know that you were seen in the emergency department and have them acquire all of your records so that they can discuss the findings with you and formulate a treatment plan to fully care for your new and ongoing problems. Please call Cardiology Clinic and set-up an appointment as an outpatient Please take medications as prescribed for heart Please rest  Please continue to monitor symptoms closely and if symptoms are to worsen or change (fever greater than 101, chills, sweating, nausea, vomiting, chest pain, shortness of breathe, difficulty breathing, weakness, numbness, tingling, worsening or changes to pain pattern, fainting, dizziness, headache, sweating, visual changes) please report back to the Emergency Department immediately.   Atrial Fibrillation Atrial fibrillation is a condition that causes your heart to beat irregularly. It may also cause your heart to beat faster than normal. Atrial fibrillation can prevent your heart from pumping blood normally. It increases your risk of stroke and heart problems. HOME CARE  Take medications as told by your doctor.  Only take medications that your doctor says are safe. Some medications can make the condition worse or happen again.  If blood thinners were prescribed by your doctor, take them exactly as told. Too much can cause bleeding. Too little and you will not have the needed protection against stroke and other problems.  Perform blood tests at home if told by your doctor.  Perform blood tests exactly as told by your doctor.  Do not drink alcohol.  Do not drink beverages with caffeine such as coffee, soda, and some teas.  Maintain a healthy weight.  Do not use diet pills unless your doctor says they are safe. They may make heart problems worse.  Follow diet instructions as told by your doctor.  Exercise regularly as told by  your doctor.  Keep all follow-up appointments. GET HELP IF:  You notice a change in the speed, rhythm, or strength of your heartbeat.  You suddenly begin peeing (urinating) more often.  You get tired more easily when moving or exercising. GET HELP RIGHT AWAY IF:   You have chest or belly (abdominal) pain.  You feel sick to your stomach (nauseous).  You are short of breath.  You suddenly have swollen feet and ankles.  You feel dizzy.  You face, arms, or legs feel numb or weak.  There is a change in your vision or speech. MAKE SURE YOU:   Understand these instructions.  Will watch your condition.  Will get help right away if you are not doing well or get worse. Document Released: 02/10/2008 Document Revised: 09/17/2013 Document Reviewed: 06/13/2012 [facility] Patient Information 2015 [place], [place]. This information is not intended to replace advice given to you by your health care provider. Make sure you discuss any questions you have with your health care provider.   Emergency Department Resource Guide 1) Find a Doctor and Pay Out of Pocket Although you won't have to find out who is covered by your insurance plan, it is a good idea to ask around and get recommendations. You will then need to call the office and see if the doctor you have chosen will accept you as a new patient and what types of options they offer for patients who are self-pay. Some doctors offer discounts or will set up payment plans for their patients who do not have insurance, but you will need to ask so you aren't surprised  when you get to your appointment.  2) Contact Your Local Health Department Not all health departments have doctors that can see patients for sick visits, but many do, so it is worth a call to see if yours does. If you don't know where your local health department is, you can check in your phone book. The CDC also has a tool to help you locate your state's health department, and many  state websites also have listings of all of their local health departments.  3) Find a Walk-in Clinic If your illness is not likely to be very severe or complicated, you may want to try a walk in clinic. These are popping up all over the country in pharmacies, drugstores, and shopping centers. They're usually staffed by nurse practitioners or physician assistants that have been trained to treat common illnesses and complaints. They're usually fairly quick and inexpensive. However, if you have serious medical issues or chronic medical problems, these are probably not your best option.  No Primary Care Doctor: - Call Health Connect at  [phone] - they can help you locate a primary care doctor that  accepts your insurance, provides certain services, etc. - Physician Referral Service- [phone]  Chronic Pain Problems: Organization         Address  Phone   Notes  [facility]  [phone] Patients need to be referred by their primary care doctor.   Medication Assistance: Organization         Address  Phone   Notes  [facility] Medication [facility] (1-Rh) [address] [place] [place] [place], [place] [address] [phone] --Must be a resident of [facility] -- Must have NO insurance coverage whatsoever (no Medicaid/ Medicare, etc.) -- The pt. MUST have a primary care doctor that directs their care regularly and follows them in the community   [facility]ist  [phone]   [employer]  [phone]    Agencies that provide inexpensive medical care: Organization         Address  Phone   Notes  [facility]  [phone]   [facility]    [phone]   [facility] [address] [place], [place] [address] [phone]   [facility] [address] [place] [phone]   Planned Parenthood    [phone]   [facility]    [phone]   Community Health and  [facility]  [address], [place] Phone:  [phone], Fax:  [phone] Hours of Operation:  9 am - 6 pm, M-F.  Also accepts Medicaid/Medicare and self-pay.  [facility]  [address], [place] Phone: [phone], Fax: [phone]. Hours of Operation:  8:30 am - 5:30 pm, M-F.  Also accepts Medicaid and self-pay.  [facility] [place] [address], [place] Phone: [phone]   [facility] [address] Natasha Bence [place], [place] [phone], Ext. [phone] Mondays & Thursdays: 7-9 AM.  First 15 patients are seen on a first come, first serve basis.    Medicaid-accepting [facility] Providers:  Organization         Address  Phone   Notes  [facility] [address], [place] [phone] Also accepts self-pay patients.  [facility] [address] Laurell Josephs [place], [place]  [phone]   [facility] [address] [place], [address], [place] [phone]   [facility] [address], [place] [phone]   Renaye Rakers [address], [place]   [phone] Only accepts [place] patients after they have their name applied to their card.   Self-Pay (no insurance) in [facility]:  Organization         Address  Phone   Notes  Sickle Cell Patients, [facility] [address] [place], [place] [phone]   [facility] [address] [place], [place] [phone]   [facility] [place]  [address], [place] [phone]   [facility]/Dr. Osei-Bonsu  [address] [place] or [address], [place] [phone] Phone number for both [place] and [place] locations is the same.  [facility] [address], [place] [phone]   [facility] [address], [place] or [address] [phone]   [facility] [address], [place] [phone], phone; [phone], fax Sees patients 1st and 3rd Saturday of every month.  Must not qualify for public or private insurance (i.e. Medicaid, Medicare, [place] Health Choice, Veterans' Benefits)  Household income should be no more than 200% of the poverty level The clinic cannot treat you if you are pregnant or think you are pregnant  Sexually transmitted diseases are not treated at the clinic.    Dental Care: Organization         Address  Phone  Notes  [facility] [address] [place], [place] [phone] Accepts children up to age 62 who are enrolled in [place] or [place] Health Choice; pregnant women with a Medicaid card; and children who have applied for Medicaid or [place] Health Choice, but were declined, whose parents can pay a reduced fee at time of service.  [facility]  [address], [place] [phone] Accepts children up to age 42 who are enrolled in [place] or [place] Health Choice; pregnant women with a Medicaid card; and children who have applied for Medicaid or  Health Choice, but were declined, whose parents can pay a reduced fee at time of service.  [facility]  [address] [place], [place] [phone] Patients are seen by appointment only. Walk-ins are not accepted. [facility] will see patients 55 years of age and older. Monday - Tuesday (8am-5pm) Most Wednesdays (8:30-5pm) $30 per visit, cash only  [facility]  [address], [facility] [phone] Patients are seen by appointment only. Walk-ins are not accepted. [facility] will see patients 18 years of age and older. One Wednesday Evening (Monthly: Volunteer Based).  $30 per visit, cash only  [employer]  [phone] for adults; Children under age 9, call Graduate Pediatric Dentistry at [phone]. Children aged 13-14, please call [phone] to request a pediatric application.  Dental services are provided in all areas of dental care including fillings, crowns and bridges, complete and partial dentures, implants, gum treatment, root canals, and extractions. Preventive care is also provided. Treatment is provided to both adults and children. Patients are selected via a lottery and there is often a waiting list.  [facility] [address], [place]  [phone] [email or web address]   [facility] [address], [place], [place] [phone], Ext. [phone] Second and Fourth Thursday of each month, opens at 6:30 AM; Clinic ends at 9 AM.  Patients are seen on a first-come first-served basis, and a limited number are seen during each clinic.   [facility]  [address], [place], [place] [phone]   Eligibility Requirements You must have lived in [place], [place], or [place] counties for at least the last three months.   You cannot be eligible for state or federal sponsored [place]healthcare insurance, including CIGNAVeterans Administration, [place]icaid, or [employer]icare.   You generally cannot be eligible for healthcare insurance through your employer.    How to apply: Eligibility screenings are held every Tuesday and Wednesday afternoon from 1:00 pm until 4:00 pm. You do not need an appointment for the interview!  [facility] [address], [place], [place] [phone]   [facility]  [phone]   [facility]  [phone]   [facility]  [phone]    Behavioral Health Resources in the Community: Intensive Outpatient Programs Organization         Address  Phone  Notes  [facility] [address], [place], [place]  [phone]   [facility] Outpatient [address], [place], [place] [phone]   [facility] [address], [place], [place]  [phone]   [facility] [address],  [place], [place] [phone] or [phone]   Substance Abuse Resources Organization         Address  Phone  Notes  [facility]  [phone]   [facility]  [phone]   [facility]  [phone]   [facility]  [phone]   Residential & Outpatient Substance Abuse Program  [phone]   Psychological Services Organization         Address  Phone  Notes  [facility]  [phone]- [phone]   [facility]  [phone]- [phone]   [facility] [address], [place] [phone] or [phone]    Mobile Crisis Teams Organization         Address  Phone  Notes  [facility], Mobile Crisis Care Unit  [phone]   [facility]  [address] [place], [place] [phone]   Doristine LocksSharon DeEsch [address] [place] [phone]    Self-Help/Support Groups Organization         Address  Phone             Notes  [facility] of [place] - variety of support groups  336- [phone] Call for more information  Narcotics Anonymous (NA), [facility] [address], [place]   2 meetings at this location   Statisticianesidential Treatment Programs Organization         Address  Phone  Notes  [facility] [address],    [place]  [phone]   [facility]  [address], [place] [phone], [place], [place] [phone]   [facility] [address], [place] [phone] Admissions: 8am-3pm M-F  [facility] [address]    [place], [place] [phone]   [facility] [address], [place], [place] [phone]   [facility] [address]  [place], [place]   [facility] - Intensive Outpatient [address], [place], [place]   [facility] [address]  [place], [place] [phone] or [phone]   [facility] ([facility]) [address] [place], [place] Accepts Medicaid  [facility] John Sevier [address],  [place] [phone] Substance Abuse/Addiction Treatment   [facility] Organization         Address  Phone  Notes  [facility]  [phone]   Domenic Schwab, PhD [address] Arlis Porta [place], [place]   [phone] or [phone]   [place], [place] [phone]   [facility] [address], [place], [place] [phone] Insurance/Medicaid/sponsorship through [facility] and Families [address] [place]                                    [place], [place] [phone] [place] [address] Street[place] [place], [place] [phone]    Dr. Adele Schilder  [phone]   [facility] Dept. 1) [address] [employer] 2) [place] 3)  [address], [place] [phone]  [phone]   [place] [phone] or [phone] (After Hours)

## 2014-06-18 NOTE — ED Notes (Signed)
Pt c/o sob/nausea/lack of sleep/palpitations that started yesterday. Pt seen and tx for same complaint last week.

## 2014-06-18 NOTE — ED Provider Notes (Signed)
Patient presents evaluation of dyspnea and palpitations. Recent admission for rapid A. fib. States she had been well controlled on Cardizem. Has EF of 20. Was changed from Cardizem to digoxin.  States she's been compliant with this and her Eliquis. However during the late afternoon evening yesterday began feeling palpitations. Dyspneic with orthopnea tonight. Called paramedics. Found to have AF with RVR of 120. Given Cardizem 20 mg IV en route. States she feels "much much better".  Exam shows irregular irregular pulse. Diminished right basilar breath sounds. Trace edema. Unchanged per patient.  Patient awaiting chest x-ray and labs. Will need at least discussion with cardiology regarding medications and further recommendations here.  Rolland Porter, MD 06/18/14 [id number]

## 2014-06-18 NOTE — ED Notes (Signed)
Patient provided a cab voucher from case management.  Patient was taken to the waiting room by this RN in a wheelchair.

## 2014-06-18 NOTE — ED Notes (Signed)
Pt agitated about prior medication adjustments. Dr. Patria Mane encouraged pt to call clinic today to schedule the follow up appointment that she was supposed to have.

## 2014-06-18 NOTE — ED Notes (Signed)
Pt cancelled appointment at clinic for Thursday.

## 2014-06-18 NOTE — ED Notes (Signed)
MD at bedside. Dr. Campos. 

## 2014-06-18 NOTE — Discharge Planning (Signed)
NCM consulted to aid in transportation to MD appointments.  NCM has spoken with this pt on previous visits.  Reminded pt that she may get [facility] if she will notify them the day before her appointment.  Pt verbalizes that she "now remembers that conversation and will be sure to use this in the future."  NCM offered to set up appointment, pt declined- states that she will set it up when she is feeling better.
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
## 2014-06-20 ENCOUNTER — Inpatient Hospital Stay (HOSPITAL_COMMUNITY): Payer: Medicaid - Out of State

## 2014-07-03 ENCOUNTER — Emergency Department (HOSPITAL_COMMUNITY): Payer: Medicaid - Out of State

## 2014-07-03 ENCOUNTER — Inpatient Hospital Stay (HOSPITAL_COMMUNITY)
Admission: EM | Admit: 2014-07-03 | Discharge: 2014-07-06 | DRG: 292 | Disposition: A | Discharge status: Home / Self Care | Payer: Medicaid - Out of State | Attending: Interventional Cardiology | Admitting: Interventional Cardiology

## 2014-07-03 ENCOUNTER — Other Ambulatory Visit: Payer: Self-pay

## 2014-07-03 ENCOUNTER — Encounter (HOSPITAL_COMMUNITY): Payer: Self-pay

## 2014-07-03 DIAGNOSIS — E669 Obesity, unspecified: Secondary | ICD-10-CM | POA: Diagnosis present

## 2014-07-03 DIAGNOSIS — I5023 Acute on chronic systolic (congestive) heart failure: Principal | ICD-10-CM | POA: Diagnosis present

## 2014-07-03 DIAGNOSIS — I509 Heart failure, unspecified: Secondary | ICD-10-CM

## 2014-07-03 DIAGNOSIS — Z7901 Long term (current) use of anticoagulants: Secondary | ICD-10-CM

## 2014-07-03 DIAGNOSIS — Z9119 Patient's noncompliance with other medical treatment and regimen: Secondary | ICD-10-CM | POA: Diagnosis present

## 2014-07-03 DIAGNOSIS — I351 Nonrheumatic aortic (valve) insufficiency: Secondary | ICD-10-CM | POA: Diagnosis present

## 2014-07-03 DIAGNOSIS — R0602 Shortness of breath: Secondary | ICD-10-CM

## 2014-07-03 DIAGNOSIS — Z6841 Body Mass Index (BMI) 40.0 and over, adult: Secondary | ICD-10-CM

## 2014-07-03 DIAGNOSIS — Z91199 Patient's noncompliance with other medical treatment and regimen due to unspecified reason: Secondary | ICD-10-CM

## 2014-07-03 DIAGNOSIS — E1165 Type 2 diabetes mellitus with hyperglycemia: Secondary | ICD-10-CM | POA: Diagnosis present

## 2014-07-03 DIAGNOSIS — I482 Chronic atrial fibrillation, unspecified: Secondary | ICD-10-CM | POA: Diagnosis present

## 2014-07-03 DIAGNOSIS — E876 Hypokalemia: Secondary | ICD-10-CM | POA: Diagnosis not present

## 2014-07-03 DIAGNOSIS — I429 Cardiomyopathy, unspecified: Secondary | ICD-10-CM

## 2014-07-03 DIAGNOSIS — Z888 Allergy status to other drugs, medicaments and biological substances status: Secondary | ICD-10-CM

## 2014-07-03 DIAGNOSIS — I10 Essential (primary) hypertension: Secondary | ICD-10-CM | POA: Diagnosis present

## 2014-07-03 DIAGNOSIS — Z880 Allergy status to penicillin: Secondary | ICD-10-CM

## 2014-07-03 DIAGNOSIS — M1990 Unspecified osteoarthritis, unspecified site: Secondary | ICD-10-CM | POA: Diagnosis present

## 2014-07-03 LAB — CBC
HCT: 37.3 % (ref 36.0–46.0)
Hemoglobin: 12.1 g/dL (ref 12.0–15.0)
MCH: 33.4 pg (ref 26.0–34.0)
MCHC: 32.4 g/dL (ref 30.0–36.0)
MCV: 103 fL — ABNORMAL HIGH (ref 78.0–100.0)
Platelets: 201 10*3/uL (ref 150–400)
RBC: 3.62 MIL/uL — ABNORMAL LOW (ref 3.87–5.11)
RDW: 16.3 % — ABNORMAL HIGH (ref 11.5–15.5)
WBC: 8.8 10*3/uL (ref 4.0–10.5)

## 2014-07-03 LAB — BASIC METABOLIC PANEL
Anion gap: 6 (ref 5–15)
BUN: 15 mg/dL (ref 6–23)
CO2: 26 mmol/L (ref 19–32)
Calcium: 8.8 mg/dL (ref 8.4–10.5)
Chloride: 104 mmol/L (ref 96–112)
Creatinine, Ser: 1.04 mg/dL (ref 0.50–1.10)
GFR calc Af Amer: 64 mL/min — ABNORMAL LOW (ref >90)
GFR calc non Af Amer: 56 mL/min — ABNORMAL LOW (ref >90)
Glucose, Bld: 285 mg/dL — ABNORMAL HIGH (ref 70–99)
Potassium: 4.2 mmol/L (ref 3.5–5.1)
Sodium: 136 mmol/L (ref 135–145)

## 2014-07-03 LAB — DIGOXIN LEVEL: Digoxin Level: 0.9 ng/mL (ref 0.8–2.0)

## 2014-07-03 LAB — BRAIN NATRIURETIC PEPTIDE: B Natriuretic Peptide: 1055.2 pg/mL — ABNORMAL HIGH (ref 0.0–100.0)

## 2014-07-03 LAB — GLUCOSE, CAPILLARY: Glucose-Capillary: 233 mg/dL — ABNORMAL HIGH (ref 70–99)

## 2014-07-03 LAB — I-STAT TROPONIN, ED: Troponin i, poc: 0.01 ng/mL (ref 0.00–0.08)

## 2014-07-03 MED ORDER — LISINOPRIL 5 MG PO TABS
5.0000 mg | ORAL_TABLET | Freq: Every day | ORAL | Status: DC
  Filled 2014-07-03: qty 1

## 2014-07-03 MED ORDER — FUROSEMIDE 10 MG/ML IJ SOLN
80.0000 mg | Freq: Once | INTRAMUSCULAR | Status: AC
  Administered 2014-07-04: 80 mg via INTRAVENOUS
  Filled 2014-07-03: qty 8

## 2014-07-03 MED ORDER — FUROSEMIDE 10 MG/ML IJ SOLN: 40.0000 mg | Freq: Once | INTRAMUSCULAR | Status: DC

## 2014-07-03 MED ORDER — ACETAMINOPHEN 325 MG PO TABS
650.0000 mg | ORAL_TABLET | ORAL | Status: DC | PRN
Start: 2014-07-03 — End: 2014-07-06

## 2014-07-03 MED ORDER — POTASSIUM CHLORIDE CRYS ER 20 MEQ PO TBCR
20.0000 meq | EXTENDED_RELEASE_TABLET | Freq: Every day | ORAL | Status: DC
  Filled 2014-07-03 (×2): qty 1

## 2014-07-03 MED ORDER — GLYBURIDE 5 MG PO TABS
5.0000 mg | ORAL_TABLET | Freq: Every day | ORAL | Status: DC
  Administered 2014-07-04 – 2014-07-06 (×3): 5 mg via ORAL
  Filled 2014-07-03 (×4): qty 1

## 2014-07-03 MED ORDER — SODIUM CHLORIDE 0.9 % IV SOLN: 250.0000 mL | INTRAVENOUS | Status: DC | PRN

## 2014-07-03 MED ORDER — INSULIN ASPART 100 UNIT/ML ~~LOC~~ SOLN
0.0000 [IU] | Freq: Every day | SUBCUTANEOUS | Status: DC
  Administered 2014-07-03: 2 [IU] via SUBCUTANEOUS

## 2014-07-03 MED ORDER — NITROGLYCERIN 0.4 MG SL SUBL
0.4000 mg | SUBLINGUAL_TABLET | SUBLINGUAL | Status: DC | PRN
Start: 2014-07-03 — End: 2014-07-06

## 2014-07-03 MED ORDER — DIGOXIN 125 MCG PO TABS
0.1250 mg | ORAL_TABLET | Freq: Every day | ORAL | Status: DC
  Administered 2014-07-04 – 2014-07-06 (×3): 0.125 mg via ORAL
  Filled 2014-07-03 (×3): qty 1

## 2014-07-03 MED ORDER — FUROSEMIDE 40 MG PO TABS: 40.0000 mg | ORAL_TABLET | Freq: Every day | ORAL | Status: DC

## 2014-07-03 MED ORDER — ALPRAZOLAM 0.25 MG PO TABS: 0.2500 mg | ORAL_TABLET | Freq: Two times a day (BID) | ORAL | Status: DC | PRN

## 2014-07-03 MED ORDER — METOPROLOL SUCCINATE ER 100 MG PO TB24
100.0000 mg | ORAL_TABLET | Freq: Two times a day (BID) | ORAL | Status: DC
Start: 2014-07-03 — End: 2014-07-06
  Administered 2014-07-03 – 2014-07-06 (×6): 100 mg via ORAL
  Filled 2014-07-03 (×8): qty 1

## 2014-07-03 MED ORDER — ASPIRIN EC 81 MG PO TBEC
81.0000 mg | DELAYED_RELEASE_TABLET | Freq: Every day | ORAL | Status: DC
  Filled 2014-07-03: qty 1

## 2014-07-03 MED ORDER — ZOLPIDEM TARTRATE 5 MG PO TABS: 5.0000 mg | ORAL_TABLET | Freq: Every evening | ORAL | Status: DC | PRN

## 2014-07-03 MED ORDER — ASPIRIN 81 MG PO CHEW
324.0000 mg | CHEWABLE_TABLET | Freq: Once | ORAL | Status: AC
  Administered 2014-07-03: 324 mg via ORAL
  Filled 2014-07-03: qty 4

## 2014-07-03 MED ORDER — ONDANSETRON HCL 4 MG/2ML IJ SOLN: 4.0000 mg | Freq: Four times a day (QID) | INTRAMUSCULAR | Status: DC | PRN

## 2014-07-03 MED ORDER — DIGOXIN 125 MCG PO TABS: 0.1250 mg | ORAL_TABLET | Freq: Every day | ORAL | Status: DC

## 2014-07-03 MED ORDER — SODIUM CHLORIDE 0.9 % IJ SOLN: 3.0000 mL | INTRAMUSCULAR | Status: DC | PRN

## 2014-07-03 MED ORDER — APIXABAN 5 MG PO TABS
5.0000 mg | ORAL_TABLET | Freq: Two times a day (BID) | ORAL | Status: DC
  Administered 2014-07-03 – 2014-07-06 (×6): 5 mg via ORAL
  Filled 2014-07-03 (×8): qty 1

## 2014-07-03 MED ORDER — INSULIN ASPART 100 UNIT/ML ~~LOC~~ SOLN
0.0000 [IU] | Freq: Three times a day (TID) | SUBCUTANEOUS | Status: DC
  Administered 2014-07-04 (×2): 5 [IU] via SUBCUTANEOUS
  Administered 2014-07-05: 2 [IU] via SUBCUTANEOUS
  Administered 2014-07-05: 3 [IU] via SUBCUTANEOUS
  Administered 2014-07-05: 5 [IU] via SUBCUTANEOUS
  Administered 2014-07-06: 3 [IU] via SUBCUTANEOUS

## 2014-07-03 MED ORDER — SODIUM CHLORIDE 0.9 % IJ SOLN
3.0000 mL | Freq: Two times a day (BID) | INTRAMUSCULAR | Status: DC
  Administered 2014-07-03 – 2014-07-06 (×6): 3 mL via INTRAVENOUS

## 2014-07-03 NOTE — Progress Notes (Signed)
Report received from the ED at 1740 and pt arrived to the unit at 1818 via stretcher. Pt A&O x4; pt remains on 2l oxygen; placed on telemetry and verified; no pressure ulcer noted; vitals taken, pain assessed; pt oriented to the room and unit; call light within reach; pt in bed comfortably with call light within reach. Dinner tray served to pt already. Reported off to incoming RN. Arabella Merles Shawniece Oyola RN.

## 2014-07-03 NOTE — ED Notes (Signed)
Pt. Refusing lasix dose. States that she has already had a dose today and cannot tolerate increased urination. Dr. Manus Gunning made aware.

## 2014-07-03 NOTE — Discharge Planning (Signed)
CM discussed with pt that she has had several admissions in the past and she is still [place] [facility] [place]- she has had ample time to get that changed to Medicaid [address]. CM stated that pt has to be accountalbe for care as well. Pt got upset with CM and stated that she will get the Rx's for 30 day supply before she leaves and will not have to do a hospital f/u even if the transportation is set up and asked for CM to leave the room. Pt may need Resources to Psych outpatient if MD agrees. Vesta Mixer would be a great Resource: Address: [address], [place], [place] [address].  NCM also reminded pt that if she were to get established at [facility]; they can arrange for transportation to clinic for her.
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
## 2014-07-03 NOTE — ED Notes (Signed)
Pt. Is from home. Increased sob x 1 week. Got worse today. Feels SOB at rest and with exertion. Pt. Has hx of AFIB. Was placed on eliquis for AFIB but ran out after 7 days. AxO x4. 93 on RA.
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
## 2014-07-04 ENCOUNTER — Encounter (HOSPITAL_COMMUNITY): Payer: Self-pay | Admitting: General Practice

## 2014-07-04 LAB — GLUCOSE, CAPILLARY
Glucose-Capillary: 214 mg/dL — ABNORMAL HIGH (ref 70–99)
Glucose-Capillary: 230 mg/dL — ABNORMAL HIGH (ref 70–99)
Glucose-Capillary: 108 mg/dL — ABNORMAL HIGH (ref 70–99)
Glucose-Capillary: 88 mg/dL (ref 70–99)

## 2014-07-04 LAB — BASIC METABOLIC PANEL
Anion gap: 5 (ref 5–15)
BUN: 16 mg/dL (ref 6–23)
CO2: 26 mmol/L (ref 19–32)
Calcium: 8.9 mg/dL (ref 8.4–10.5)
Chloride: 107 mmol/L (ref 96–112)
Creatinine, Ser: 0.95 mg/dL (ref 0.50–1.10)
GFR calc Af Amer: 72 mL/min — ABNORMAL LOW (ref >90)
GFR calc non Af Amer: 62 mL/min — ABNORMAL LOW (ref >90)
Glucose, Bld: 215 mg/dL — ABNORMAL HIGH (ref 70–99)
Potassium: 3.8 mmol/L (ref 3.5–5.1)
Sodium: 138 mmol/L (ref 135–145)

## 2014-07-04 MED ORDER — FUROSEMIDE 10 MG/ML IJ SOLN
80.0000 mg | Freq: Two times a day (BID) | INTRAMUSCULAR | Status: DC
Start: 2014-07-04 — End: 2014-07-06
  Administered 2014-07-05 – 2014-07-06 (×2): 80 mg via INTRAVENOUS
  Filled 2014-07-04 (×6): qty 8

## 2014-07-04 MED ORDER — LISINOPRIL 10 MG PO TABS
10.0000 mg | ORAL_TABLET | Freq: Every day | ORAL | Status: DC
  Administered 2014-07-04 – 2014-07-06 (×3): 10 mg via ORAL
  Filled 2014-07-04 (×3): qty 1

## 2014-07-04 NOTE — Progress Notes (Signed)
I stopped in to see Kaitlyn [name].  She is known from prior hospital admissions.  I inquired as to why she feels she was readmitted to the hospital and she replied "I went back into Afib and it was just too much".  She says that she ran out of some medications and tells me she was going to call the AHF clinic to get an appt.  I reminded her that I set up an appt for her prior to her leaving the hospital last admission and she did not keep it.  I emphasized that in order to keep her well --her physician will need to see her more frequently on an outpatient basis (she has not come in for outpatient follow-up yet- even with transportation provided).   I asked her if she would be open to Paramedicine visits in her home since she does not like to come in for clinic visits.  She seems very reluctant---telling me "I think we have the medicines figured out this time".  I will attempt again tomorrow to convince her.

## 2014-07-04 NOTE — Progress Notes (Signed)
Pt. Refuses Potassium

## 2014-07-04 NOTE — Care Management Note (Addendum)
    Page 1 of 2   07/06/2014     2:46:09 PM CARE MANAGEMENT NOTE 07/06/2014  Patient:  Kaitlyn [name], Kaitlyn [name]   Account Number:  [id number]  Date Initiated:  07/04/2014  Documentation initiated by:  AMERSON,JULIE  Subjective/Objective Assessment:   Pt adm on 07/03/14 with CHF exacerbation.  PTA, pt resides at home alone, and is independent.  She has no PCP and no transportation.     Action/Plan:   Pt moved here from [place] several mos ago, and still has [place].  She is a frequent readmission to [facility] and is noncompliant with meds and follow up appts.   Anticipated DC Date:  07/06/2014   Anticipated DC Plan:  [place]  CM consult  [facility]      [facility] Choice  HOME HEALTH   Choice offered to / List presented to:  C-1 Patient        [place] arranged  HH-1 RN  [place]      [place] agency  [place]   Status of service:  Completed, signed off Medicare Important Message given?  NO (If response is "NO", the following Medicare IM given date fields will be blank) Date Medicare IM given:   Medicare IM given by:   Date Additional Medicare IM given:   Additional Medicare IM given by:    Discharge Disposition:  [place]  Per UR Regulation:  Reviewed for med. necessity/level of care/duration of stay  If discussed at [place] of Stay Meetings, dates discussed:    Comments:  07/06/14 CM and CSW met with pt who states she will be receiving HH and will follow up at clinic.  CSW arranging transport home.  CM called [facility] to notify of discharge for [facility]. No other CM needs were communicated.  Mariane Masters, BSN, [place].  07/04/14 Ellan Lambert, RN,BSN [phone] Met with pt to discuss [facility] follow up.  Pt has been identified as High Risk for readmission, and has been selected for [facility] program with [facility].  Homecare liasion [place] and this CM met with pt to discuss discharge  needs.  Pt still has [place], and must cancel this on her own in order to apply for [place] [facility].  She states she has the number of the person to call, but has not done this yet.  Encouraged pt to make call ASAP, as [place] [facility] is of no use to her here.  Pt states she has no car, and has difficulty getting to clinic appts.  She has her groceries and Rx delivered to her home, but states she runs out of meds and can't get refills because she has not followed up with MD.  Discussed benefits of [place] follow up, and how [facility] RN and CSW can help her with transportation and other issues. She is reluctant, but does agree to [facility] follow up with [facility].  Will follow progress.

## 2014-07-04 NOTE — Care Management Utilization Note (Signed)
UR completed.    Andrina Locken Wise Kameria Canizares, RN, BSN Phone #[phone]  

## 2014-07-04 NOTE — Progress Notes (Signed)
Patient ID: Kaitlyn [name], female   DOB: 08/14/1950, 64 y.o.   MRN: [id number]   64 yo with history of chronic atrial fibrillation and chronic systolic CHF presented with atrial fibrillation with RVR and dyspnea. Patient moved here from [place] in 2015. She has no family locally and no car. She was initially seen in 11/15 with atrial fibrillation/RVR and acute on chronic systolic CHF after running out of meds. She was diuresed and rate-controlled. She did not want attempt at DCCV or cardiac cath. She was sent home with meds but never followed up as outpatient due to transportation difficulties. She was admitted again in 12/15 with atrial fibrillation/RVR and acute on chronic systolic CHF after running out of meds. Again diuresed and rate-controlled. Again, she did not followup as outpatient due to transportation issues. She was admitted 1/27 with atrial fibrillation/RVR and acute on chronic systolic CHF after running out of her meds. Again she did not followup after discharge.   She was admitted again on 2/17 with worsening dyspnea and was noted to be volume overloaded.   She refused Lasix last night but took a dose this morning.  Says she feels better and wants to go home.   Scheduled Meds: . apixaban  5 mg Oral BID  . aspirin EC  81 mg Oral Daily  . digoxin  0.125 mg Oral Daily  . furosemide  40 mg Intravenous Once  . [START ON 07/05/2014] furosemide  40 mg Oral Daily  . glyBURIDE  5 mg Oral Q breakfast  . insulin aspart  0-15 Units Subcutaneous TID WC  . insulin aspart  0-5 Units Subcutaneous QHS  . lisinopril  5 mg Oral Daily  . metoprolol succinate  100 mg Oral BID  . potassium chloride SA  20 mEq Oral Daily  . sodium chloride  3 mL Intravenous Q12H   Continuous Infusions:  PRN Meds:.sodium chloride, acetaminophen, ALPRAZolam, nitroGLYCERIN, ondansetron (ZOFRAN) IV, sodium chloride, zolpidem   Filed Vitals:   07/03/14 2013 07/04/14 0018 07/04/14 0419 07/04/14 0800  BP: 139/97  139/77 128/84 126/83  Pulse: 90 92 90 81  Temp: 97.5 F (36.4 C) 97.5 F (36.4 C) 97.8 F (36.6 C) 97.6 F (36.4 C)  TempSrc: Oral Oral Oral Oral  Resp: 20 20 20 22   Height:      Weight:   292 lb 4.8 oz (132.586 kg)   SpO2: 91% 92% 94% 94%    Intake/Output Summary (Last 24 hours) at 07/04/14 0903 Last data filed at 07/04/14 0630  Gross per 24 hour  Intake    603 ml  Output   1600 ml  Net   -997 ml    LABS: Basic Metabolic Panel:  Recent Labs  01/75/10 1429 07/04/14 0550  NA 136 138  K 4.2 3.8  CL 104 107  CO2 26 26  GLUCOSE 285* 215*  BUN 15 16  CREATININE 1.04 0.95  CALCIUM 8.8 8.9   Liver Function Tests: No results for input(s): AST, ALT, ALKPHOS, BILITOT, PROT, ALBUMIN in the last 72 hours. No results for input(s): LIPASE, AMYLASE in the last 72 hours. CBC:  Recent Labs  07/03/14 1429  WBC 8.8  HGB 12.1  HCT 37.3  MCV 103.0*  PLT 201   Cardiac Enzymes: No results for input(s): CKTOTAL, CKMB, CKMBINDEX, TROPONINI in the last 72 hours. BNP: Invalid input(s): POCBNP D-Dimer: No results for input(s): DDIMER in the last 72 hours. Hemoglobin A1C: No results for input(s): HGBA1C in the last 72 hours. Fasting Lipid  Panel: No results for input(s): CHOL, HDL, LDLCALC, TRIG, CHOLHDL, LDLDIRECT in the last 72 hours. Thyroid Function Tests: No results for input(s): TSH, T4TOTAL, T3FREE, THYROIDAB in the last 72 hours.  Invalid input(s): FREET3 Anemia Panel: No results for input(s): VITAMINB12, FOLATE, FERRITIN, TIBC, IRON, RETICCTPCT in the last 72 hours.  RADIOLOGY: Dg Chest 2 View  06/18/2014   CLINICAL DATA:  Difficulty breathing; atrial fibrillation ; hypertension  EXAM: CHEST  2 VIEW  COMPARISON:  June 12, 2014  FINDINGS: There is mild bibasilar atelectatic change. There is no appreciable edema or consolidation. Heart is enlarged with pulmonary vascularity within normal limits. No adenopathy. Aorta is mildly tortuous. No adenopathy. No bone lesions.   IMPRESSION: Stable cardiomegaly. Right base atelectasis. No edema or consolidation.   Electronically Signed   By: Bretta Bang M.D.   On: 06/18/2014 07:16   Dg Chest Port 1 View  07/03/2014   CLINICAL DATA:  Shortness of breath  EXAM: PORTABLE CHEST - 1 VIEW  COMPARISON:  06/18/2014  FINDINGS: Cardiac shadow remains enlarged. Increasing vascular congestion as well as right basilar atelectasis and effusion is noted. No acute bony abnormality is seen.  IMPRESSION: Worsening CHF with associated right basilar atelectasis.   Electronically Signed   By: Alcide Clever M.D.   On: 07/03/2014 15:36   Dg Chest Port 1 View  06/12/2014   CLINICAL DATA:  64 year old female with shortness of breath, atrial fibrillation, tachycardia. Initial encounter.  EXAM: PORTABLE CHEST - 1 VIEW  COMPARISON:  05/07/2014 and earlier.  FINDINGS: Portable AP view at 0640 hrs. Mildly lower lung volumes. Stable cardiomegaly and mediastinal contours. Visualized tracheal air column is within normal limits. Interval decreased pulmonary vascularity, and improved ventilation of both lung bases. No pneumothorax, pleural effusion or consolidation identified.  IMPRESSION: Cardiomegaly with interval decreased pulmonary vascular congestion and improved bibasilar ventilation.   Electronically Signed   By: Augusto Gamble M.D.   On: 06/12/2014 06:58    PHYSICAL EXAM General: NAD Neck: JVP 12-14 cm, no thyromegaly or thyroid nodule.  Lungs: Slight crackles at bases bilaterally.  CV: Heart irregular S1/S2, no S3/S4, no murmur.  1+ edema to knees bilaterally.   Abdomen: Soft, nontender, no hepatosplenomegaly, no distention.  Neurologic: Alert and oriented x 3.  Psych: Normal affect. Extremities: No clubbing or cyanosis.   TELEMETRY: Reviewed telemetry pt in atrial fibrillation, rate 90s  ASSESSMENT AND PLAN: 64 yo with history of chronic atrial fibrillation and chronic systolic CHF presented acute on chronic systolic CHF.  1. Atrial  fibrillation: Chronic/persistent, possibly since 2012. We have discussed cardioversion and she is not interested in attempting this. I suspect ability to keep her in NSR long-term would be limited anyway if she has been in atrial fibrillation since 2012.  - She continue digoxin and Toprol XL 100 mg bid. Digoxin level ok. - Restarted Eliquis 5 mg bid.  2. Acute on chronic systolic CHF: EF 16% with severe LV dilation. She is volume overloaded, likely due to inadequate diuresis at home. Suspect nonischemic cardiomyopathy.  She has refused cardiac cath in the past.  She needs extensive diuresis if she can be convinced to stay in the hospital for it.  - Continue Toprol XL - Increase lisinopril to 10 mg daily.  - Lasix 80 mg IV bid.   3. Aortic insufficiency: Severe by one echo but last echo with only trivial AI.  4. Noncompliance and social situation are very limiting here.  Says she cannot get to followup appts  as she does not have a car.  Would be good candidate for paramedicine program.  Will need social work help with transportation.    [name] 07/04/2014 9:14 AM

## 2014-07-05 LAB — BASIC METABOLIC PANEL
Anion gap: 3 — ABNORMAL LOW (ref 5–15)
BUN: 17 mg/dL (ref 6–23)
CO2: 34 mmol/L — ABNORMAL HIGH (ref 19–32)
Calcium: 9 mg/dL (ref 8.4–10.5)
Chloride: 104 mmol/L (ref 96–112)
Creatinine, Ser: 1.11 mg/dL — ABNORMAL HIGH (ref 0.50–1.10)
GFR calc Af Amer: 59 mL/min — ABNORMAL LOW (ref >90)
GFR calc non Af Amer: 51 mL/min — ABNORMAL LOW (ref >90)
Glucose, Bld: 121 mg/dL — ABNORMAL HIGH (ref 70–99)
Potassium: 3.3 mmol/L — ABNORMAL LOW (ref 3.5–5.1)
Sodium: 141 mmol/L (ref 135–145)

## 2014-07-05 LAB — GLUCOSE, CAPILLARY
Glucose-Capillary: 128 mg/dL — ABNORMAL HIGH (ref 70–99)
Glucose-Capillary: 201 mg/dL — ABNORMAL HIGH (ref 70–99)
Glucose-Capillary: 158 mg/dL — ABNORMAL HIGH (ref 70–99)
Glucose-Capillary: 176 mg/dL — ABNORMAL HIGH (ref 70–99)

## 2014-07-05 MED ORDER — POTASSIUM CHLORIDE CRYS ER 10 MEQ PO TBCR
20.0000 meq | EXTENDED_RELEASE_TABLET | Freq: Every day | ORAL | Status: DC
  Administered 2014-07-06: 20 meq via ORAL
  Filled 2014-07-05 (×2): qty 2

## 2014-07-05 MED ORDER — POTASSIUM CHLORIDE CRYS ER 20 MEQ PO TBCR
40.0000 meq | EXTENDED_RELEASE_TABLET | Freq: Once | ORAL | Status: DC
  Filled 2014-07-05: qty 2

## 2014-07-05 MED ORDER — POTASSIUM CHLORIDE CRYS ER 10 MEQ PO TBCR
40.0000 meq | EXTENDED_RELEASE_TABLET | Freq: Once | ORAL | Status: AC
  Administered 2014-07-05: 40 meq via ORAL
  Filled 2014-07-05: qty 4

## 2014-07-05 NOTE — Plan of Care (Signed)
Problem: Phase I Progression Outcomes Goal: EF % per last Echo/documented,Core Reminder form on chart Outcome: Completed/Met Date Met:  07/05/14 Echo performed on 06/13/2014 EF% Result - 20%

## 2014-07-05 NOTE — Progress Notes (Signed)
Patient ID: Kaitlyn [name], female   DOB: [birth year], 64 y.o.   MRN: [id number]   65 yo with history of chronic atrial fibrillation and chronic systolic CHF presented with atrial fibrillation with RVR and dyspnea. Patient moved here from [place] in 2015. She has no family locally and no car. She was initially seen in 11/15 with atrial fibrillation/RVR and acute on chronic systolic CHF after running out of meds. She was diuresed and rate-controlled. She did not want attempt at DCCV or cardiac cath. She was sent home with meds but never followed up as outpatient due to transportation difficulties. She was admitted again in 12/15 with atrial fibrillation/RVR and acute on chronic systolic CHF after running out of meds. Again diuresed and rate-controlled. Again, she did not followup as outpatient due to transportation issues. She was admitted 1/27 with atrial fibrillation/RVR and acute on chronic systolic CHF after running out of her meds. Again she did not followup after discharge.   She was admitted again on 2/17 with worsening dyspnea and was noted to be volume overloaded. Weight down 11 pounds . Refused pm lasix.   Denies SOB.    Scheduled Meds: . apixaban  5 mg Oral BID  . digoxin  0.125 mg Oral Daily  . furosemide  40 mg Intravenous Once  . furosemide  80 mg Intravenous BID  . glyBURIDE  5 mg Oral Q breakfast  . insulin aspart  0-15 Units Subcutaneous TID WC  . insulin aspart  0-5 Units Subcutaneous QHS  . lisinopril  10 mg Oral Daily  . metoprolol succinate  100 mg Oral BID  . potassium chloride SA  20 mEq Oral Daily  . sodium chloride  3 mL Intravenous Q12H   Continuous Infusions:  PRN Meds:.sodium chloride, acetaminophen, ALPRAZolam, nitroGLYCERIN, ondansetron (ZOFRAN) IV, sodium chloride, zolpidem   Filed Vitals:   07/04/14 1245 07/04/14 1817 07/04/14 1953 07/05/14 0537  BP: 117/70 113/83 129/91 128/82  Pulse: 98 94 83 90  Temp: 97.5 F (36.4 C) 97.7 F (36.5 C) 97.7 F (36.5  C) 97.3 F (36.3 C)  TempSrc: Oral Oral Oral Oral  Resp: Height:      Weight:    281 lb 3.2 oz (127.551 kg)  SpO2: 95% 90% 98% 92%    Intake/Output Summary (Last 24 hours) at 07/05/14 0803 Last data filed at 07/05/14 8413  Gross per 24 hour  Intake   1930 ml  Output   7600 ml  Net  -5670 ml    LABS: Basic Metabolic Panel:  Recent Labs  24/40/10 0550 07/05/14 0603  NA 138 141  K 3.8 3.3*  CL 107 104  CO2 26 34*  GLUCOSE 215* 121*  BUN 16 17  CREATININE 0.95 1.11*  CALCIUM 8.9 9.0   Liver Function Tests: No results for input(s): AST, ALT, ALKPHOS, BILITOT, PROT, ALBUMIN in the last 72 hours. No results for input(s): LIPASE, AMYLASE in the last 72 hours. CBC:  Recent Labs  07/03/14 1429  WBC 8.8  HGB 12.1  HCT 37.3  MCV 103.0*  PLT 201   Cardiac Enzymes: No results for input(s): CKTOTAL, CKMB, CKMBINDEX, TROPONINI in the last 72 hours. BNP: Invalid input(s): POCBNP D-Dimer: No results for input(s): DDIMER in the last 72 hours. Hemoglobin A1C: No results for input(s): HGBA1C in the last 72 hours. Fasting Lipid Panel: No results for input(s): CHOL, HDL, LDLCALC, TRIG, CHOLHDL, LDLDIRECT in the last 72 hours. Thyroid Function Tests: No results for input(s): TSH,  T4TOTAL, T3FREE, THYROIDAB in the last 72 hours.  Invalid input(s): FREET3 Anemia Panel: No results for input(s): VITAMINB12, FOLATE, FERRITIN, TIBC, IRON, RETICCTPCT in the last 72 hours.  RADIOLOGY: Dg Chest 2 View  06/18/2014   CLINICAL DATA:  Difficulty breathing; atrial fibrillation ; hypertension  EXAM: CHEST  2 VIEW  COMPARISON:  June 12, 2014  FINDINGS: There is mild bibasilar atelectatic change. There is no appreciable edema or consolidation. Heart is enlarged with pulmonary vascularity within normal limits. No adenopathy. Aorta is mildly tortuous. No adenopathy. No bone lesions.  IMPRESSION: Stable cardiomegaly. Right base atelectasis. No edema or consolidation.    Electronically Signed   By: Bretta Bang M.D.   On: 06/18/2014 07:16   Dg Chest Port 1 View  07/03/2014   CLINICAL DATA:  Shortness of breath  EXAM: PORTABLE CHEST - 1 VIEW  COMPARISON:  06/18/2014  FINDINGS: Cardiac shadow remains enlarged. Increasing vascular congestion as well as right basilar atelectasis and effusion is noted. No acute bony abnormality is seen.  IMPRESSION: Worsening CHF with associated right basilar atelectasis.   Electronically Signed   By: Alcide Clever M.D.   On: 07/03/2014 15:36   Dg Chest Port 1 View  06/12/2014   CLINICAL DATA:  64 year old female with shortness of breath, atrial fibrillation, tachycardia. Initial encounter.  EXAM: PORTABLE CHEST - 1 VIEW  COMPARISON:  05/07/2014 and earlier.  FINDINGS: Portable AP view at 0640 hrs. Mildly lower lung volumes. Stable cardiomegaly and mediastinal contours. Visualized tracheal air column is within normal limits. Interval decreased pulmonary vascularity, and improved ventilation of both lung bases. No pneumothorax, pleural effusion or consolidation identified.  IMPRESSION: Cardiomegaly with interval decreased pulmonary vascular congestion and improved bibasilar ventilation.   Electronically Signed   By: Augusto Gamble M.D.   On: 06/12/2014 06:58    PHYSICAL EXAM General: NAD Neck: JVP ~10 cm, no thyromegaly or thyroid nodule.  Lungs: Slight crackles at bases bilaterally.  CV: Heart irregular S1/S2, no S3/S4, no murmur.  1+ edema to knees bilaterally.   Abdomen: Soft, nontender, no hepatosplenomegaly, no distention.  Neurologic: Alert and oriented x 3.  Psych: Normal affect. Extremities: No clubbing or cyanosis. R and LLE 1-2+ edema   TELEMETRY: Reviewed telemetry pt in atrial fibrillation, rate 90s  ASSESSMENT AND PLAN: 64 yo with history of chronic atrial fibrillation and chronic systolic CHF presented acute on chronic systolic CHF.  1. Atrial fibrillation: Chronic/persistent, possibly since 2012. We have discussed  cardioversion and she is not interested in attempting this. Has been in A fib since 2012.  - She continue digoxin and Toprol XL 100 mg bid. Digoxin level ok. - Restarted Eliquis 5 mg bid.  2. Acute on chronic systolic CHF: EF 16% with severe LV dilation. She is volume overloaded, likely due to inadequate diuresis at home. Suspect nonischemic cardiomyopathy.  She has refused cardiac cath in the past.  Weight continues to go down. Continue lasix 80 mg twice a day.   - Continue Toprol XL - Continue lisinopril to 10 mg daily.  3. Aortic insufficiency: Severe by one echo but last echo with only trivial AI.  4. Noncompliance and social situation are very limiting here.  Says she cannot get to followup appts as she does not have a car.  Would be good candidate for paramedicine program.  Will need social work help with transportation.    Agreeable to one more day in the hospital.   CLEGG,AMY NP-C  07/05/2014 8:03 AM  Patient seen  with NP, agree with the above note.  Volume gradually improving, weight coming down.  She is willing to stay in the hospital one more day, will continue IV Lasix 80 mg bid.  When she goes home, would send her out on torsemide 40 qam, 20 qpm.  She needs followup in CHF clinic in 1 week.  She says she will come if she gets transportation.   [name] 07/05/2014 12:33 PM

## 2014-07-06 LAB — BASIC METABOLIC PANEL
Anion gap: 8 (ref 5–15)
BUN: 19 mg/dL (ref 6–23)
CO2: 27 mmol/L (ref 19–32)
Calcium: 8.9 mg/dL (ref 8.4–10.5)
Chloride: 103 mmol/L (ref 96–112)
Creatinine, Ser: 1.1 mg/dL (ref 0.50–1.10)
GFR calc Af Amer: 60 mL/min — ABNORMAL LOW (ref >90)
GFR calc non Af Amer: 52 mL/min — ABNORMAL LOW (ref >90)
Glucose, Bld: 158 mg/dL — ABNORMAL HIGH (ref 70–99)
Potassium: 3.4 mmol/L — ABNORMAL LOW (ref 3.5–5.1)
Sodium: 138 mmol/L (ref 135–145)

## 2014-07-06 LAB — GLUCOSE, CAPILLARY
Glucose-Capillary: 170 mg/dL — ABNORMAL HIGH (ref 70–99)
Glucose-Capillary: 138 mg/dL — ABNORMAL HIGH (ref 70–99)

## 2014-07-06 MED ORDER — TORSEMIDE 20 MG PO TABS
20.0000 mg | ORAL_TABLET | Freq: Every evening | ORAL | Status: DC
  Filled 2014-07-06: qty 1

## 2014-07-06 MED ORDER — APIXABAN 5 MG PO TABS: 5.0000 mg | ORAL_TABLET | Freq: Two times a day (BID) | ORAL | Status: DC

## 2014-07-06 MED ORDER — POTASSIUM CHLORIDE CRYS ER 20 MEQ PO TBCR
40.0000 meq | EXTENDED_RELEASE_TABLET | Freq: Once | ORAL | Status: AC
  Administered 2014-07-06: 40 meq via ORAL

## 2014-07-06 MED ORDER — TORSEMIDE 20 MG PO TABS
40.0000 mg | ORAL_TABLET | Freq: Every morning | ORAL | Status: DC
  Filled 2014-07-06: qty 2

## 2014-07-06 MED ORDER — POTASSIUM CHLORIDE CRYS ER 20 MEQ PO TBCR: 20.0000 meq | EXTENDED_RELEASE_TABLET | Freq: Two times a day (BID) | ORAL | Status: DC

## 2014-07-06 MED ORDER — TORSEMIDE 20 MG PO TABS: 40.0000 mg | ORAL_TABLET | Freq: Every morning | ORAL | Status: DC

## 2014-07-06 MED ORDER — LISINOPRIL 10 MG PO TABS: 10.0000 mg | ORAL_TABLET | Freq: Every day | ORAL | Status: DC

## 2014-07-06 NOTE — Discharge Summary (Signed)
Discharge Summary   Patient ID: Kaitlyn [name] MRN: [id number], DOB/AGE: [birth year] 64 y.o. Admit date: 07/03/2014 D/C date:     07/06/2014  Primary Cardiologist: Dr Shirlee Latch  Principal Problem:   Acute on chronic congestive heart failure Active Problems:   Diabetes mellitus type 2, uncontrolled   Obesity (BMI 30-39.9)   H/O noncompliance with medical treatment, presenting hazards to health   Chronic atrial fibrillation   Cardiomyopathy-EF 15-20% by echo Jan 2016    Admission Dates: 07/03/14-07/06/14 Discharge Diagnosis: A/C systolic CHF. Discharge weight 275lbs.  HPI: Kaitlyn [name] is a 64 y.o. female with a history of chronic atrial fibrillation, chronic systolic CHF (EF 91-63%), obesity and medical non-compliance who was admitted to [facility] on 07/03/14 with worsening dyspnea and was noted to be volume overloaded.   Patient moved here from [place] about 3 months ago.  She has no family locally and no car. She lives alone in an apartment. She was initially seen in 11/15 with atrial fibrillation/RVR and acute on chronic systolic CHF after running out of meds.  She was diuresed and rate-controlled.  She did not want attempt at DCCV or cardiac cath.  She was sent home with meds but never followed up as outpatient due to transportation difficulties.  She was admitted again in 12/15 with atrial fibrillation/RVR and acute on chronic systolic CHF after running out of meds.  Again diuresed and rate-controlled.  Again, she did not followup as outpatient due to transportation issues.   She was admitted yet again 06/12/14 to 06/13/14 with atrial fibrillation with RVR and acute on chronic systolic CHF after running out of her meds. She was started on IV amiodarone and Toprol Xl  and achieved adequate rate control. She again refused cardioversion as well cardiac cath. Her EF by echo during that admission was 15% with global HK.   Attempts were made to diurese with IV lasix however she refused. She was restarted on  HF meds and provided with a 30 day supply. Lengthy discussions occurred regarding medication compliance and follow up. She was offered [facility] however she refused. She was given an appointment in the HF clinic the following week but didn't show. She was seen in the ER 06/18/14 again with palpations that improved with Diltiazem IV x 1.    She presented once again to [facility] on 07/03/14 with DOE. Her BNP is 1055 and she has CHF on CXR. She says she has been out of Eliquis for more than a week. She tells me she has a week supply of her other medications. She "was going to make an appointment to be seen next week".    Hospital Course  Chronic atrial fibrillation: Chronic/persistent, possibly since 2012. We have discussed cardioversion and she is not interested in attempting this. Has been in A fib since 2012.  -- She continue digoxin and Toprol XL 100 mg bid. Digoxin level ok. -- Restarted Eliquis 5 mg bid. Social work has seen who will help her obtain her Eliquis; however, it doesn't appear that the patient will be complaint with this. I have sent it into her pharmacy in the case that she does pick it up.   Acute on chronic systolic CHF: EF 84% with severe LV dilation.She presented with volume overload likely due to inadequate diuresis at home. Suspect nonischemic cardiomyopathy. She has refused cardiac cath in the past. -- Diuresed well on IV lasix. Net neg 10.7L. Weight down 11lbs (286--> 275lbs)  -- Continue Toprol XL and lisinopril to 10  mg daily.  -- We will send her out on torsemide 40 mg in the AM and 20 mg in the PM and she will need to be followed up in the heart failure clinic in one week.  Aortic insufficiency: Severe by one echo but last echo with only trivial AI.   DMII- continue home regimen  Hypokalemia- 3.4 today. Will give her one time supplementation at discharge and maintenance dosing of 20 mEq BID.  Noncompliance and social situation are very limiting here. Says she cannot get  to followup appts as she does not have a car. Would be good candidate for paramedicine program. Social work and care management spent a lot of time with this patient and feel very frustrated as she has been seen by them multiple times in the past and does not use the resources provided and does "what she wants." they feel that they cannot help her anymore than they already have. They provided her with info on the community health [facility] wellness center and offered her help with transportation. It is unlikely she will be complaint with medications and appointments in the future.  -- She will be sent home with home health RN and SW   The patient has had an uncomplicated hospital course and is recovering well. She has been seen by Dr. Patty Sermons  today and deemed ready for discharge home. All follow-up appointments have been scheduled (staff message sent to CHF clinic to arrange for f/u during normal business hours Monday). Discharge medications are listed below.   Discharge Vitals: Blood pressure 131/75, pulse 94, temperature 98.1 F (36.7 C), temperature source Oral, resp. rate 18, height  (1.803 m), weight 275 lb 6.4 oz (124.921 kg), SpO2 94 %.  Labs: Lab Results  Component Value Date   WBC 8.8 07/03/2014   HGB 12.1 07/03/2014   HCT 37.3 07/03/2014   MCV 103.0* 07/03/2014   PLT 201 07/03/2014     Recent Labs Lab 07/06/14 0938  NA 138  K 3.4*  CL 103  CO2 27  BUN 19  CREATININE 1.10  CALCIUM 8.9  GLUCOSE 158*    Lab Results  Component Value Date   CHOL 104 03/23/2014   HDL 36* 03/23/2014   LDLCALC 49 03/23/2014   TRIG 93 03/23/2014     Diagnostic Studies/Procedures   Dg Chest 2 View  06/18/2014   CLINICAL DATA:  Difficulty breathing; atrial fibrillation ; hypertension  EXAM: CHEST  2 VIEW  COMPARISON:  June 12, 2014  FINDINGS: There is mild bibasilar atelectatic change. There is no appreciable edema or consolidation. Heart is enlarged with pulmonary vascularity  within normal limits. No adenopathy. Aorta is mildly tortuous. No adenopathy. No bone lesions.  IMPRESSION: Stable cardiomegaly. Right base atelectasis. No edema or consolidation.   Electronically Signed   By: Bretta Bang M.D.   On: 06/18/2014 07:16   Dg Chest Port 1 View  07/03/2014   CLINICAL DATA:  Shortness of breath  EXAM: PORTABLE CHEST - 1 VIEW  COMPARISON:  06/18/2014  FINDINGS: Cardiac shadow remains enlarged. Increasing vascular congestion as well as right basilar atelectasis and effusion is noted. No acute bony abnormality is seen.  IMPRESSION: Worsening CHF with associated right basilar atelectasis.   Electronically Signed   By: Alcide Clever M.D.   On: 07/03/2014 15:36   Dg Chest Port 1 View  06/12/2014   CLINICAL DATA:  64 year old female with shortness of breath, atrial fibrillation, tachycardia. Initial encounter.  EXAM: PORTABLE CHEST - 1  VIEW  COMPARISON:  05/07/2014 and earlier.  FINDINGS: Portable AP view at 0640 hrs. Mildly lower lung volumes. Stable cardiomegaly and mediastinal contours. Visualized tracheal air column is within normal limits. Interval decreased pulmonary vascularity, and improved ventilation of both lung bases. No pneumothorax, pleural effusion or consolidation identified.  IMPRESSION: Cardiomegaly with interval decreased pulmonary vascular congestion and improved bibasilar ventilation.   Electronically Signed   By: Augusto Gamble M.D.   On: 06/12/2014 06:58    Discharge Medications     Medication List    STOP taking these medications        furosemide 40 MG tablet  Commonly known as:  LASIX      TAKE these medications        apixaban 5 MG Tabs tablet  Commonly known as:  ELIQUIS  Take 1 tablet (5 mg total) by mouth 2 (two) times daily.     digoxin 0.125 MG tablet  Commonly known as:  LANOXIN  Take 1 tablet (0.125 mg total) by mouth daily.     glyBURIDE 5 MG tablet  Commonly known as:  DIABETA  Take 1 tablet (5 mg total) by mouth daily with  breakfast.     lisinopril 10 MG tablet  Commonly known as:  PRINIVIL,ZESTRIL  Take 1 tablet (10 mg total) by mouth daily.     metoprolol succinate 100 MG 24 hr tablet  Commonly known as:  TOPROL-XL  Take 1 tablet (100 mg total) by mouth 2 (two) times daily. Take with or immediately following a meal.     potassium chloride SA 20 MEQ tablet  Commonly known as:  K-DUR,KLOR-CON  Take 1 tablet (20 mEq total) by mouth 2 (two) times daily.     torsemide 20 MG tablet  Commonly known as:  DEMADEX  Take 2 tablets (40 mg total) by mouth every morning. And then take 1 tablet (20 mg ) by mouth every evening        Disposition   The patient will be discharged in stable condition to home.  Follow-up Information    Follow up with [name], MD.   Specialty:  Cardiology   Why:  The office will call you to make an appoinment., If you do not hear from them, please contact them., You should be seen within  1 week   Contact information:   [address]  [address] [place] [address] [phone]         Duration of Discharge Encounter: Greater than 30 minutes including physician and PA time.  Byrd Hesselbach R PA-C 07/06/2014, 2:41 PM

## 2014-07-06 NOTE — Discharge Instructions (Signed)

## 2014-07-06 NOTE — Progress Notes (Signed)
Patient Name: Kaitlyn [name] Date of Encounter: 07/06/2014     Principal Problem:   Systolic CHF, acute on chronic Active Problems:   Diabetes mellitus type 2, uncontrolled   Obesity (BMI 30-39.9)   H/O noncompliance with medical treatment, presenting hazards to health   Chronic atrial fibrillation   Cardiomyopathy-EF 15-20% by echo Jan 2016   Acute on chronic congestive heart failure    SUBJECTIVE  The patient is feeling well today.  She has diuresed well from Lasix.  She is anxious to go home.  She denies any chest pain.  Her dyspnea has improved significantly.  Her peripheral edema has improved.  CURRENT MEDS . apixaban  5 mg Oral BID  . digoxin  0.125 mg Oral Daily  . furosemide  40 mg Intravenous Once  . furosemide  80 mg Intravenous BID  . glyBURIDE  5 mg Oral Q breakfast  . insulin aspart  0-15 Units Subcutaneous TID WC  . insulin aspart  0-5 Units Subcutaneous QHS  . lisinopril  10 mg Oral Daily  . metoprolol succinate  100 mg Oral BID  . potassium chloride  20 mEq Oral Daily  . sodium chloride  3 mL Intravenous Q12H    OBJECTIVE  Filed Vitals:   07/05/14 1036 07/05/14 1403 07/05/14 2127 07/06/14 0519  BP: 102/74 108/68 129/85 131/75  Pulse: 94  94 94  Temp:  98 F (36.7 C) 97.7 F (36.5 C) 98.1 F (36.7 C)  TempSrc:  Oral Oral Oral  Resp:  Height:      Weight:    275 lb 6.4 oz (124.921 kg)  SpO2:  91% 97% 94%    Intake/Output Summary (Last 24 hours) at 07/06/14 1125 Last data filed at 07/06/14 0819  Gross per 24 hour  Intake   1200 ml  Output   3250 ml  Net  -2050 ml   Filed Weights   07/04/14 0419 07/05/14 0537 07/06/14 0519  Weight: 292 lb 4.8 oz (132.586 kg) 281 lb 3.2 oz (127.551 kg) 275 lb 6.4 oz (124.921 kg)    PHYSICAL EXAM  General: Pleasant, NAD.  Morbid obesity Neuro: Alert and oriented X 3. Moves all extremities spontaneously. Psych: Normal affect. HEENT:  Normal  Neck: Supple without bruits or JVD. Lungs:  Resp  regular and unlabored, CTA. Heart: Irregularly irregular no s3, s4, or murmurs. Abdomen: Soft, non-tender, non-distended, BS + x 4.  Extremities: No clubbing, cyanosis.  Trace edema  Accessory Clinical Findings  CBC  Recent Labs  07/03/14 1429  WBC 8.8  HGB 12.1  HCT 37.3  MCV 103.0*  PLT 201   Basic Metabolic Panel  Recent Labs  07/05/14 0603 07/06/14 0938  NA 141 138  K 3.3* 3.4*  CL 104 103  CO2 34* 27  GLUCOSE 121* 158*  BUN 17 19  CREATININE 1.11* 1.10  CALCIUM 9.0 8.9   Liver Function Tests No results for input(s): AST, ALT, ALKPHOS, BILITOT, PROT, ALBUMIN in the last 72 hours. No results for input(s): LIPASE, AMYLASE in the last 72 hours. Cardiac Enzymes No results for input(s): CKTOTAL, CKMB, CKMBINDEX, TROPONINI in the last 72 hours. BNP Invalid input(s): POCBNP D-Dimer No results for input(s): DDIMER in the last 72 hours. Hemoglobin A1C No results for input(s): HGBA1C in the last 72 hours. Fasting Lipid Panel No results for input(s): CHOL, HDL, LDLCALC, TRIG, CHOLHDL, LDLDIRECT in the last 72 hours. Thyroid Function Tests No results for input(s): TSH, T4TOTAL, T3FREE, THYROIDAB in  the last 72 hours.  Invalid input(s): FREET3  TELE  Atrial fibrillation with controlled ventricular response    Radiology/Studies  Dg Chest 2 View  06/18/2014   CLINICAL DATA:  Difficulty breathing; atrial fibrillation ; hypertension  EXAM: CHEST  2 VIEW  COMPARISON:  June 12, 2014  FINDINGS: There is mild bibasilar atelectatic change. There is no appreciable edema or consolidation. Heart is enlarged with pulmonary vascularity within normal limits. No adenopathy. Aorta is mildly tortuous. No adenopathy. No bone lesions.  IMPRESSION: Stable cardiomegaly. Right base atelectasis. No edema or consolidation.   Electronically Signed   By: Bretta Bang M.D.   On: 06/18/2014 07:16   Dg Chest Port 1 View  07/03/2014   CLINICAL DATA:  Shortness of breath  EXAM: PORTABLE  CHEST - 1 VIEW  COMPARISON:  06/18/2014  FINDINGS: Cardiac shadow remains enlarged. Increasing vascular congestion as well as right basilar atelectasis and effusion is noted. No acute bony abnormality is seen.  IMPRESSION: Worsening CHF with associated right basilar atelectasis.   Electronically Signed   By: Alcide Clever M.D.   On: 07/03/2014 15:36   Dg Chest Port 1 View  06/12/2014   CLINICAL DATA:  64 year old female with shortness of breath, atrial fibrillation, tachycardia. Initial encounter.  EXAM: PORTABLE CHEST - 1 VIEW  COMPARISON:  05/07/2014 and earlier.  FINDINGS: Portable AP view at 0640 hrs. Mildly lower lung volumes. Stable cardiomegaly and mediastinal contours. Visualized tracheal air column is within normal limits. Interval decreased pulmonary vascularity, and improved ventilation of both lung bases. No pneumothorax, pleural effusion or consolidation identified.  IMPRESSION: Cardiomegaly with interval decreased pulmonary vascular congestion and improved bibasilar ventilation.   Electronically Signed   By: Augusto Gamble M.D.   On: 06/12/2014 06:58    ASSESSMENT AND PLAN  1.  Chronic atrial fibrillation, on long-term anticoagulation and rate control 2.  Acute on chronic systolic heart failure 3.  Past history of noncompliance  Plan: Okay for discharge today.  As per Dr. Alford Highland note, we will send her out on torsemide 40 mg in the morning and 20 mg in the evening and she will need to be followed up in the heart failure clinic in one week. Signed, Cassell Clement MD

## 2014-07-06 NOTE — Progress Notes (Signed)
CMT notified nurse of patient having 7 beats of V-Tach. Went to assess the patient and patient stated she was moving from the chair to the bedside commode and then to bed. She stated that she did not feel any chest pain or any palpitations at all. Notified on-call doctor Dr.Liu. Orders given to continue to monitor patient. Will continue to monitor patient to end of shift.

## 2014-07-06 NOTE — Progress Notes (Signed)
CSW (Clinical Child psychotherapist) notified needing transportation assistance. CSW very familiar with pt. CSW visited pt room and notified pt that non-emergent transport may not be covered by pt insurance and pt will likely get a bill. Pt aware of this information from previous visits. CSW notified pt that transportation company has previously stopped providing transport to pts that accumulate too large of a bill and that pt should be cautious of using non-emergent transport for all needs. Pt still aware of available transportation resources and encouraged to apply for [place] [employer]. CSW arranged transport and notified nurse. CSW signing off.  Yarel Kilcrease Lajean Saver Weekend CSW [phone]

## 2014-07-08 ENCOUNTER — Telehealth: Payer: Self-pay | Admitting: Licensed Clinical Social Worker

## 2014-07-08 NOTE — Telephone Encounter (Signed)
CSW attempted to contact patient to follow up although no answer and message left. CSW will continue to reach out to patient and remind of upcoming clinic appointment on July 11, 2014. Lasandra Beech, LCSW [phone]

## 2014-07-10 ENCOUNTER — Telehealth (HOSPITAL_COMMUNITY): Payer: Self-pay | Admitting: Vascular Surgery

## 2014-07-10 NOTE — Telephone Encounter (Signed)
[facility] called pt refused Par medicine program and she wants to cancel hosp f/u for tomorrow.Marland KitchenMarland KitchenMarland Kitchen

## 2014-07-10 NOTE — Telephone Encounter (Signed)
Noted, will review with chf providers

## 2014-07-11 ENCOUNTER — Encounter (HOSPITAL_COMMUNITY): Payer: Medicaid - Out of State

## 2014-07-16 ENCOUNTER — Telehealth: Payer: Self-pay | Admitting: Licensed Clinical Social Worker

## 2014-07-16 NOTE — Telephone Encounter (Signed)
CSW attempted to contact patient to follow up on cancelled appointments and inability for paramedicine to see patient. Unfortunately, unable to reach patient and message left for return call. Lasandra Beech, LCSW [phone]

## 2014-07-23 ENCOUNTER — Telehealth: Payer: Self-pay | Admitting: Licensed Clinical Social Worker

## 2014-07-24 NOTE — Telephone Encounter (Signed)
CSW received return call from patient stating she is running out of medications and needs a follow up appointment. Patient reports she is feeling well and denies any immediate concerns. She requests an appointment "sometime next week". CSW assisted with obtaining an appointment form Monday July 28, 2013. Patient stated she would need transportation as she has no money for cab fare. Patient also mentioned she has not followed up with Social Security regarding change of address from [place]. CSW also discussed possibility of eligibility for Medicare as she has been on SSD for 2 years now. Patient instructed to call Social Security for change in address and Medicare to discuss enrollment. Patient verbalizes understanding of follow up calls and clinic staff have arranged cab fare for transport to clinic appointment on Monday. CSW will meet with patient during clinic visit to follow up on needs and provide ongoing support. Lasandra Beech, LCSW [phone]

## 2014-07-29 ENCOUNTER — Other Ambulatory Visit (HOSPITAL_COMMUNITY): Payer: Self-pay

## 2014-07-29 ENCOUNTER — Inpatient Hospital Stay (HOSPITAL_COMMUNITY)
Admission: EM | Admit: 2014-07-29 | Discharge: 2014-08-01 | DRG: 309 | Disposition: A | Discharge status: Home / Self Care | Payer: Medicaid - Out of State | Attending: Internal Medicine | Admitting: Internal Medicine

## 2014-07-29 ENCOUNTER — Emergency Department (HOSPITAL_COMMUNITY): Payer: Medicaid - Out of State

## 2014-07-29 ENCOUNTER — Encounter (HOSPITAL_COMMUNITY): Payer: Self-pay | Admitting: Emergency Medicine

## 2014-07-29 ENCOUNTER — Encounter (HOSPITAL_COMMUNITY): Payer: Medicaid - Out of State

## 2014-07-29 DIAGNOSIS — R509 Fever, unspecified: Secondary | ICD-10-CM | POA: Diagnosis present

## 2014-07-29 DIAGNOSIS — Z9119 Patient's noncompliance with other medical treatment and regimen: Secondary | ICD-10-CM | POA: Diagnosis present

## 2014-07-29 DIAGNOSIS — R197 Diarrhea, unspecified: Secondary | ICD-10-CM

## 2014-07-29 DIAGNOSIS — I5022 Chronic systolic (congestive) heart failure: Secondary | ICD-10-CM | POA: Diagnosis present

## 2014-07-29 DIAGNOSIS — E1165 Type 2 diabetes mellitus with hyperglycemia: Secondary | ICD-10-CM | POA: Diagnosis present

## 2014-07-29 DIAGNOSIS — N390 Urinary tract infection, site not specified: Secondary | ICD-10-CM | POA: Diagnosis present

## 2014-07-29 DIAGNOSIS — I4891 Unspecified atrial fibrillation: Principal | ICD-10-CM | POA: Diagnosis present

## 2014-07-29 DIAGNOSIS — Z7901 Long term (current) use of anticoagulants: Secondary | ICD-10-CM | POA: Diagnosis not present

## 2014-07-29 DIAGNOSIS — R1110 Vomiting, unspecified: Secondary | ICD-10-CM | POA: Diagnosis not present

## 2014-07-29 DIAGNOSIS — I482 Chronic atrial fibrillation: Secondary | ICD-10-CM | POA: Diagnosis present

## 2014-07-29 DIAGNOSIS — Z880 Allergy status to penicillin: Secondary | ICD-10-CM | POA: Diagnosis not present

## 2014-07-29 DIAGNOSIS — I10 Essential (primary) hypertension: Secondary | ICD-10-CM | POA: Diagnosis present

## 2014-07-29 DIAGNOSIS — D7589 Other specified diseases of blood and blood-forming organs: Secondary | ICD-10-CM | POA: Diagnosis present

## 2014-07-29 DIAGNOSIS — K529 Noninfective gastroenteritis and colitis, unspecified: Secondary | ICD-10-CM | POA: Diagnosis present

## 2014-07-29 DIAGNOSIS — R112 Nausea with vomiting, unspecified: Secondary | ICD-10-CM | POA: Diagnosis present

## 2014-07-29 DIAGNOSIS — N179 Acute kidney failure, unspecified: Secondary | ICD-10-CM | POA: Diagnosis present

## 2014-07-29 DIAGNOSIS — I429 Cardiomyopathy, unspecified: Secondary | ICD-10-CM | POA: Diagnosis present

## 2014-07-29 DIAGNOSIS — Z659 Problem related to unspecified psychosocial circumstances: Secondary | ICD-10-CM | POA: Diagnosis present

## 2014-07-29 DIAGNOSIS — M1990 Unspecified osteoarthritis, unspecified site: Secondary | ICD-10-CM | POA: Diagnosis present

## 2014-07-29 LAB — CBC WITH DIFFERENTIAL/PLATELET
Basophils Absolute: 0.1 10*3/uL (ref 0.0–0.1)
Basophils Relative: 1 % (ref 0–1)
Eosinophils Absolute: 2 10*3/uL — ABNORMAL HIGH (ref 0.0–0.7)
Eosinophils Relative: 17 % — ABNORMAL HIGH (ref 0–5)
HCT: 36 % (ref 36.0–46.0)
Hemoglobin: 11.6 g/dL — ABNORMAL LOW (ref 12.0–15.0)
Lymphocytes Relative: 17 % (ref 12–46)
Lymphs Abs: 1.9 10*3/uL (ref 0.7–4.0)
MCH: 33 pg (ref 26.0–34.0)
MCHC: 32.2 g/dL (ref 30.0–36.0)
MCV: 102.6 fL — ABNORMAL HIGH (ref 78.0–100.0)
Monocytes Absolute: 0.7 10*3/uL (ref 0.1–1.0)
Monocytes Relative: 6 % (ref 3–12)
Neutro Abs: 7 10*3/uL (ref 1.7–7.7)
Neutrophils Relative %: 59 % (ref 43–77)
Platelets: 174 10*3/uL (ref 150–400)
RBC: 3.51 MIL/uL — ABNORMAL LOW (ref 3.87–5.11)
RDW: 15.9 % — ABNORMAL HIGH (ref 11.5–15.5)
WBC: 11.6 10*3/uL — ABNORMAL HIGH (ref 4.0–10.5)

## 2014-07-29 LAB — COMPREHENSIVE METABOLIC PANEL
ALT: 11 U/L (ref 0–35)
AST: 13 U/L (ref 0–37)
Albumin: 3.2 g/dL — ABNORMAL LOW (ref 3.5–5.2)
Alkaline Phosphatase: 43 U/L (ref 39–117)
Anion gap: 10 (ref 5–15)
BUN: 15 mg/dL (ref 6–23)
CO2: 21 mmol/L (ref 19–32)
Calcium: 8.8 mg/dL (ref 8.4–10.5)
Chloride: 106 mmol/L (ref 96–112)
Creatinine, Ser: 1.1 mg/dL (ref 0.50–1.10)
GFR calc Af Amer: 60 mL/min — ABNORMAL LOW (ref >90)
GFR calc non Af Amer: 52 mL/min — ABNORMAL LOW (ref >90)
Glucose, Bld: 247 mg/dL — ABNORMAL HIGH (ref 70–99)
Potassium: 4 mmol/L (ref 3.5–5.1)
Sodium: 137 mmol/L (ref 135–145)
Total Bilirubin: 1.8 mg/dL — ABNORMAL HIGH (ref 0.3–1.2)
Total Protein: 6.7 g/dL (ref 6.0–8.3)

## 2014-07-29 LAB — MRSA PCR SCREENING: MRSA by PCR: NEGATIVE

## 2014-07-29 LAB — URINE MICROSCOPIC-ADD ON

## 2014-07-29 LAB — CBC
HCT: 36.3 % (ref 36.0–46.0)
Hemoglobin: 11.8 g/dL — ABNORMAL LOW (ref 12.0–15.0)
MCH: 32.8 pg (ref 26.0–34.0)
MCHC: 32.5 g/dL (ref 30.0–36.0)
MCV: 100.8 fL — ABNORMAL HIGH (ref 78.0–100.0)
Platelets: 189 10*3/uL (ref 150–400)
RBC: 3.6 MIL/uL — ABNORMAL LOW (ref 3.87–5.11)
RDW: 15.7 % — ABNORMAL HIGH (ref 11.5–15.5)
WBC: 12.6 10*3/uL — ABNORMAL HIGH (ref 4.0–10.5)

## 2014-07-29 LAB — TROPONIN I: Troponin I: <0.03 ng/mL (ref <0.031)

## 2014-07-29 LAB — URINALYSIS, ROUTINE W REFLEX MICROSCOPIC
Bilirubin Urine: NEGATIVE
Glucose, UA: NEGATIVE mg/dL
Hgb urine dipstick: NEGATIVE
Ketones, ur: NEGATIVE mg/dL
Nitrite: NEGATIVE
Protein, ur: NEGATIVE mg/dL
Specific Gravity, Urine: 1.01 (ref 1.005–1.030)
Urobilinogen, UA: 1 mg/dL (ref 0.0–1.0)
pH: 5.5 (ref 5.0–8.0)

## 2014-07-29 LAB — I-STAT TROPONIN, ED: Troponin i, poc: 0 ng/mL (ref 0.00–0.08)

## 2014-07-29 LAB — GLUCOSE, CAPILLARY
Glucose-Capillary: 55 mg/dL — ABNORMAL LOW (ref 70–99)
Glucose-Capillary: 61 mg/dL — ABNORMAL LOW (ref 70–99)
Glucose-Capillary: 65 mg/dL — ABNORMAL LOW (ref 70–99)
Glucose-Capillary: 88 mg/dL (ref 70–99)
Glucose-Capillary: 63 mg/dL — ABNORMAL LOW (ref 70–99)

## 2014-07-29 LAB — BASIC METABOLIC PANEL
Anion gap: 11 (ref 5–15)
BUN: 17 mg/dL (ref 6–23)
CO2: 19 mmol/L (ref 19–32)
Calcium: 8.7 mg/dL (ref 8.4–10.5)
Chloride: 105 mmol/L (ref 96–112)
Creatinine, Ser: 1.09 mg/dL (ref 0.50–1.10)
GFR calc Af Amer: 61 mL/min — ABNORMAL LOW (ref >90)
GFR calc non Af Amer: 52 mL/min — ABNORMAL LOW (ref >90)
Glucose, Bld: 284 mg/dL — ABNORMAL HIGH (ref 70–99)
Potassium: 3.8 mmol/L (ref 3.5–5.1)
Sodium: 135 mmol/L (ref 135–145)

## 2014-07-29 LAB — TSH: TSH: 3.36 u[IU]/mL (ref 0.350–4.500)

## 2014-07-29 LAB — DIGOXIN LEVEL: Digoxin Level: <0.2 ng/mL — ABNORMAL LOW (ref 0.8–2.0)

## 2014-07-29 MED ORDER — DIGOXIN 0.25 MG/ML IJ SOLN
0.2500 mg | Freq: Four times a day (QID) | INTRAMUSCULAR | Status: AC
  Administered 2014-07-30 (×2): 0.25 mg via INTRAVENOUS
  Filled 2014-07-29 (×2): qty 1

## 2014-07-29 MED ORDER — ACETAMINOPHEN 325 MG PO TABS: 650.0000 mg | ORAL_TABLET | Freq: Four times a day (QID) | ORAL | Status: DC | PRN

## 2014-07-29 MED ORDER — CHLORHEXIDINE GLUCONATE CLOTH 2 % EX PADS: 6.0000 | MEDICATED_PAD | Freq: Every day | CUTANEOUS | Status: DC

## 2014-07-29 MED ORDER — DEXTROSE 5 % IV SOLN
5.0000 mg/h | Freq: Once | INTRAVENOUS | Status: AC
  Administered 2014-07-29: 10 mg/h via INTRAVENOUS

## 2014-07-29 MED ORDER — DEXTROSE 50 % IV SOLN
INTRAVENOUS | Status: AC
  Administered 2014-07-29: 50 mL via INTRAVENOUS
  Filled 2014-07-29: qty 50

## 2014-07-29 MED ORDER — DEXTROSE 50 % IV SOLN
1.0000 | Freq: Once | INTRAVENOUS | Status: AC
  Administered 2014-07-29: 50 mL via INTRAVENOUS
  Filled 2014-07-29: qty 50

## 2014-07-29 MED ORDER — ACETAMINOPHEN 650 MG RE SUPP: 650.0000 mg | Freq: Four times a day (QID) | RECTAL | Status: DC | PRN

## 2014-07-29 MED ORDER — INSULIN ASPART 100 UNIT/ML ~~LOC~~ SOLN
0.0000 [IU] | Freq: Three times a day (TID) | SUBCUTANEOUS | Status: DC
  Administered 2014-08-01 (×2): 2 [IU] via SUBCUTANEOUS

## 2014-07-29 MED ORDER — ONDANSETRON HCL 4 MG PO TABS
4.0000 mg | ORAL_TABLET | Freq: Four times a day (QID) | ORAL | Status: DC | PRN
  Administered 2014-08-01: 4 mg via ORAL
  Filled 2014-07-29: qty 1

## 2014-07-29 MED ORDER — DIGOXIN 125 MCG PO TABS
0.1250 mg | ORAL_TABLET | Freq: Every day | ORAL | Status: DC
  Administered 2014-07-31 – 2014-08-01 (×2): 0.125 mg via ORAL
  Filled 2014-07-29 (×2): qty 1

## 2014-07-29 MED ORDER — APIXABAN 5 MG PO TABS
5.0000 mg | ORAL_TABLET | Freq: Two times a day (BID) | ORAL | Status: DC
  Administered 2014-07-29 – 2014-08-01 (×7): 5 mg via ORAL
  Filled 2014-07-29 (×8): qty 1

## 2014-07-29 MED ORDER — METOPROLOL TARTRATE 1 MG/ML IV SOLN: 5.0000 mg | Freq: Four times a day (QID) | INTRAVENOUS | Status: DC | PRN

## 2014-07-29 MED ORDER — METOPROLOL SUCCINATE ER 100 MG PO TB24
100.0000 mg | ORAL_TABLET | Freq: Two times a day (BID) | ORAL | Status: DC
  Administered 2014-07-29 – 2014-08-01 (×6): 100 mg via ORAL
  Filled 2014-07-29 (×6): qty 1

## 2014-07-29 MED ORDER — DEXTROSE 50 % IV SOLN
1.0000 | Freq: Once | INTRAVENOUS | Status: AC
  Administered 2014-07-29: 50 mL via INTRAVENOUS

## 2014-07-29 MED ORDER — SODIUM CHLORIDE 0.9 % IJ SOLN
3.0000 mL | Freq: Two times a day (BID) | INTRAMUSCULAR | Status: DC
  Administered 2014-07-29 – 2014-07-31 (×5): 3 mL via INTRAVENOUS
  Filled 2014-07-29: qty 3

## 2014-07-29 MED ORDER — DILTIAZEM HCL 100 MG IV SOLR
5.0000 mg/h | Freq: Once | INTRAVENOUS | Status: AC
  Administered 2014-07-29: 5 mg/h via INTRAVENOUS

## 2014-07-29 MED ORDER — ONDANSETRON HCL 4 MG/2ML IJ SOLN: 4.0000 mg | Freq: Four times a day (QID) | INTRAMUSCULAR | Status: DC | PRN

## 2014-07-29 MED ORDER — DIGOXIN 125 MCG PO TABS
0.1250 mg | ORAL_TABLET | Freq: Every day | ORAL | Status: DC
  Administered 2014-07-29: 0.125 mg via ORAL
  Filled 2014-07-29: qty 1

## 2014-07-29 MED ORDER — LISINOPRIL 5 MG PO TABS
5.0000 mg | ORAL_TABLET | Freq: Every day | ORAL | Status: DC
  Administered 2014-07-29 – 2014-07-31 (×3): 5 mg via ORAL
  Filled 2014-07-29 (×3): qty 1

## 2014-07-29 MED ORDER — DEXTROSE 5 % IV SOLN
5.0000 mg/h | INTRAVENOUS | Status: DC
  Administered 2014-07-29: 5 mg/h via INTRAVENOUS
  Filled 2014-07-29: qty 100

## 2014-07-29 MED ORDER — TORSEMIDE 20 MG PO TABS: 20.0000 mg | ORAL_TABLET | Freq: Every day | ORAL | Status: DC

## 2014-07-29 MED ORDER — INSULIN ASPART 100 UNIT/ML ~~LOC~~ SOLN: 0.0000 [IU] | Freq: Every day | SUBCUTANEOUS | Status: DC

## 2014-07-29 MED ORDER — SODIUM CHLORIDE 0.9 % IV BOLUS (SEPSIS)
500.0000 mL | Freq: Once | INTRAVENOUS | Status: AC
  Administered 2014-07-29: 500 mL via INTRAVENOUS

## 2014-07-29 MED ORDER — DIGOXIN 0.25 MG/ML IJ SOLN
0.5000 mg | Freq: Once | INTRAMUSCULAR | Status: AC
  Administered 2014-07-29: 0.5 mg via INTRAVENOUS
  Filled 2014-07-29 (×2): qty 2

## 2014-07-29 MED ORDER — FUROSEMIDE 20 MG PO TABS: 40.0000 mg | ORAL_TABLET | Freq: Every day | ORAL | Status: DC

## 2014-07-29 MED ORDER — POTASSIUM CHLORIDE CRYS ER 20 MEQ PO TBCR
20.0000 meq | EXTENDED_RELEASE_TABLET | Freq: Two times a day (BID) | ORAL | Status: DC
  Filled 2014-07-29 (×3): qty 1

## 2014-07-29 MED ORDER — DIGOXIN 0.25 MG/ML IJ SOLN
0.2500 mg | Freq: Four times a day (QID) | INTRAMUSCULAR | Status: DC
  Filled 2014-07-29 (×2): qty 1

## 2014-07-29 MED ORDER — ENSURE COMPLETE PO LIQD: 237.0000 mL | Freq: Two times a day (BID) | ORAL | Status: DC

## 2014-07-29 MED ORDER — GLYBURIDE 5 MG PO TABS
5.0000 mg | ORAL_TABLET | Freq: Every day | ORAL | Status: DC
  Administered 2014-07-29: 5 mg via ORAL
  Filled 2014-07-29 (×2): qty 1

## 2014-07-29 MED ORDER — TORSEMIDE 20 MG PO TABS
40.0000 mg | ORAL_TABLET | Freq: Every day | ORAL | Status: DC
  Administered 2014-07-29: 40 mg via ORAL
  Filled 2014-07-29: qty 2
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
## 2014-07-29 NOTE — ED Notes (Signed)
Patient reports she has been having palpitations for a week now, and reports that the last 2 days they have gotten worse. Has been out of her medicines x10 days. On EMS arrival patient was in A-fib RVR at 190, EMS gave 10 of Cardizem and HR now 100-140's. Patient does take eloquis, but has been out x10 days. CBG 288 BP 138/72

## 2014-07-29 NOTE — Progress Notes (Signed)
Pt FSBS 61.  Dr. Sharon Seller notified and orders received to give Dextrose 50 IV.  Will continue to closely monitor.
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
## 2014-07-29 NOTE — ED Notes (Signed)
Patient transported to Ultrasound 

## 2014-07-29 NOTE — Progress Notes (Addendum)
[facility] TEAM 1 - Stepdown/ICU TEAM Progress Note  Nyasia Baxley ZOX:[id number] DOB: 1950/09/28 DOA: 07/29/2014 PCP: No PCP Per Patient  Admit HPI / Brief Narrative: 64 y.o. female with history of chronic atrial fibrillation, cardiomyopathy last EF measured was 15-20%, diabetes mellitus type 2, and hypertension who was brought to the ER after patient was complaining of palpitations. Patient stated over 4-5 days patient had been having nausea vomiting and diarrhea. Her diarrhea improved but she started developing palpitations. Patient had not taken her medications for 10 days as she ran out of them. EMS gave a Cardizem bolus and in the ER patient was started on Cardizem infusion. Chest x-ray was unremarkable. Patient denied any chest pain or productive cough.   HPI/Subjective: Pt admitted earlier today.    Assessment/Plan:  Atrial fibrillation with RVR supposed to be on BB and digoxin as outpt - has a recurring hx of noncompliance leading to RVR - load w/ dig and resume BB and try to wean off CCB asap in setting of systolic CHF   Nausea vomiting and diarrhea - UTI Begin empiric rocephin and f/u cx  Chronic systolic heart failure last EF measured15-20% - baseline weight varies between 125-135kg - currently on lower end of this range at 127kg  HTN  DM  Code Status: FULL Family Communication: no family present at time of exam Disposition Plan: SDU  Consultants: none  Procedures: none  Antibiotics: None  DVT prophylaxis: apixiban  Objective: Blood pressure 99/65, pulse 84, temperature 97.8 F (36.6 C), temperature source Oral, resp. rate 21, height  (1.803 m), weight 127.869 kg (281 lb 14.4 oz), SpO2 94 %. No intake or output data in the 24 hours ending 07/29/14 1848  Exam: No exam today - pt admitted earlier today   Data Reviewed: Basic Metabolic Panel:  Recent Labs Lab 07/29/14 0351 07/29/14 0800  NA 135 137  K 3.8 4.0  CL 105 106  CO2 19 21    GLUCOSE 284* 247*  BUN 17 15  CREATININE 1.09 1.10  CALCIUM 8.7 8.8    Liver Function Tests:  Recent Labs Lab 07/29/14 0800  AST 13  ALT 11  ALKPHOS 43  BILITOT 1.8*  PROT 6.7  ALBUMIN 3.2*   CBC:  Recent Labs Lab 07/29/14 0351 07/29/14 0800  WBC 12.6* 11.6*  NEUTROABS  --  7.0  HGB 11.8* 11.6*  HCT 36.3 36.0  MCV 100.8* 102.6*  PLT 189 174   Cardiac Enzymes:  Recent Labs Lab 07/29/14 0800  TROPONINI <0.03   CBG:  Recent Labs Lab 07/29/14 1839  GLUCAP 61*   Studies:  Recent x-ray studies have been reviewed in detail by the Attending Physician  Scheduled Meds:  Scheduled Meds: . apixaban  5 mg Oral BID  . Chlorhexidine Gluconate Cloth  6 each Topical Q0600  . dextrose  1 ampule Intravenous Once  . digoxin  0.125 mg Oral Daily  . glyBURIDE  5 mg Oral Q breakfast  . lisinopril  5 mg Oral Daily  . metoprolol succinate  100 mg Oral BID  . potassium chloride SA  20 mEq Oral BID  . sodium chloride  3 mL Intravenous Q12H  . torsemide  20 mg Oral QHS  . torsemide  40 mg Oral Daily    Time spent on care of this patient: no charge    Lonia Blood , MD   [facility] Office  [phone] Pager - Text Page per Amion as per below:  On-Call/Text Page:  [email or web address]      password [email or web address]  If 7PM-7AM, please contact night-coverage [email or web address] Password [facility] 07/29/2014, 6:48 PM   LOS: 0 days

## 2014-07-29 NOTE — ED Notes (Signed)
Called breakfast tray, carb mod and heart healthy.

## 2014-07-29 NOTE — ED Notes (Signed)
Patient reports that for the last 3 days, she has had nausea vomiting and diarrhea. However in the last 24 hours, she has had no diarrhea or emesis,, but still feels nauseous

## 2014-07-29 NOTE — Progress Notes (Signed)
Pt admitted to [facility] from ED.  On arrival BP 92/65 manual and HR 110.  Pt on Cardizem gtt at 7.5.  Dr. Sharon Seller notified and orders received to bolus pt with 500 mL NS over one hour.  Will continue to closely monitor.

## 2014-07-30 DIAGNOSIS — Z659 Problem related to unspecified psychosocial circumstances: Secondary | ICD-10-CM

## 2014-07-30 DIAGNOSIS — I429 Cardiomyopathy, unspecified: Secondary | ICD-10-CM

## 2014-07-30 LAB — BASIC METABOLIC PANEL
Anion gap: 7 (ref 5–15)
BUN: 16 mg/dL (ref 6–23)
CO2: 25 mmol/L (ref 19–32)
Calcium: 8.6 mg/dL (ref 8.4–10.5)
Chloride: 107 mmol/L (ref 96–112)
Creatinine, Ser: 1.12 mg/dL — ABNORMAL HIGH (ref 0.50–1.10)
GFR calc Af Amer: 59 mL/min — ABNORMAL LOW (ref >90)
GFR calc non Af Amer: 51 mL/min — ABNORMAL LOW (ref >90)
Glucose, Bld: 46 mg/dL — ABNORMAL LOW (ref 70–99)
Potassium: 3.9 mmol/L (ref 3.5–5.1)
Sodium: 139 mmol/L (ref 135–145)

## 2014-07-30 LAB — CBC
HCT: 38.5 % (ref 36.0–46.0)
Hemoglobin: 12.3 g/dL (ref 12.0–15.0)
MCH: 33 pg (ref 26.0–34.0)
MCHC: 31.9 g/dL (ref 30.0–36.0)
MCV: 103.2 fL — ABNORMAL HIGH (ref 78.0–100.0)
Platelets: 191 10*3/uL (ref 150–400)
RBC: 3.73 MIL/uL — ABNORMAL LOW (ref 3.87–5.11)
RDW: 16 % — ABNORMAL HIGH (ref 11.5–15.5)
WBC: 12.3 10*3/uL — ABNORMAL HIGH (ref 4.0–10.5)

## 2014-07-30 LAB — GLUCOSE, CAPILLARY
Glucose-Capillary: 108 mg/dL — ABNORMAL HIGH (ref 70–99)
Glucose-Capillary: 112 mg/dL — ABNORMAL HIGH (ref 70–99)
Glucose-Capillary: 115 mg/dL — ABNORMAL HIGH (ref 70–99)
Glucose-Capillary: 55 mg/dL — ABNORMAL LOW (ref 70–99)
Glucose-Capillary: 59 mg/dL — ABNORMAL LOW (ref 70–99)
Glucose-Capillary: 61 mg/dL — ABNORMAL LOW (ref 70–99)
Glucose-Capillary: 63 mg/dL — ABNORMAL LOW (ref 70–99)
Glucose-Capillary: 68 mg/dL — ABNORMAL LOW (ref 70–99)
Glucose-Capillary: 69 mg/dL — ABNORMAL LOW (ref 70–99)
Glucose-Capillary: 71 mg/dL (ref 70–99)
Glucose-Capillary: 72 mg/dL (ref 70–99)
Glucose-Capillary: 72 mg/dL (ref 70–99)
Glucose-Capillary: 76 mg/dL (ref 70–99)
Glucose-Capillary: 93 mg/dL (ref 70–99)
Glucose-Capillary: 95 mg/dL (ref 70–99)

## 2014-07-30 LAB — INFLUENZA PANEL BY PCR (TYPE A & B)
H1N1 flu by pcr: NOT DETECTED
Influenza A By PCR: NEGATIVE
Influenza B By PCR: NEGATIVE

## 2014-07-30 LAB — CLOSTRIDIUM DIFFICILE BY PCR: Toxigenic C. Difficile by PCR: NEGATIVE

## 2014-07-30 MED ORDER — DEXTROSE 5 % IV SOLN
INTRAVENOUS | Status: DC
  Administered 2014-07-30 – 2014-07-31 (×2): via INTRAVENOUS

## 2014-07-30 MED ORDER — DEXTROSE 50 % IV SOLN
12.5000 g | Freq: Once | INTRAVENOUS | Status: AC
  Administered 2014-07-30: 12.5 g via INTRAVENOUS

## 2014-07-30 MED ORDER — CEFUROXIME AXETIL 500 MG PO TABS: 500.0000 mg | ORAL_TABLET | Freq: Two times a day (BID) | ORAL | Status: DC

## 2014-07-30 MED ORDER — DEXTROSE 50 % IV SOLN: 1.0000 | Freq: Once | INTRAVENOUS | Status: DC

## 2014-07-30 MED ORDER — DEXTROSE-NACL 5-0.9 % IV SOLN: INTRAVENOUS | Status: DC

## 2014-07-30 NOTE — Progress Notes (Signed)
Nutrition Brief Note  Patient identified on the Malnutrition Screening Tool (MST) Report. No significant weight loss noted.  Wt Readings from Last 15 Encounters:  07/30/14 282 lb (127.914 kg)  07/06/14 275 lb 6.4 oz (124.921 kg)  06/18/14 287 lb (130.182 kg)  05/08/14 288 lb 11.2 oz (130.953 kg)  05/02/14 295 lb (133.811 kg)  04/28/14 295 lb 1.6 oz (133.856 kg)  03/24/14 271 lb 13.2 oz (123.3 kg)    Body mass index is 39.35 kg/(m^2). Patient meets criteria for class 2 obesity based on current BMI.   Current diet order is heart healthy, CHO modified, patient is consuming approximately 100% of meals at this time. Labs and medications reviewed.   No nutrition interventions warranted at this time. If nutrition issues arise, please consult RD.   Kaitlyn [name], RD, LDN, CNSC Pager [phone] After Hours Pager [phone]

## 2014-07-30 NOTE — Discharge Instructions (Signed)
Information on my medicine - ELIQUIS (apixaban)  This medication education was reviewed with me or my healthcare representative as part of my discharge preparation.  The pharmacist that spoke with me during my hospital stay was:  Pasty Spillers, [facility]  Why was Eliquis prescribed for you? Eliquis was prescribed for you to reduce the risk of a blood clot forming that can cause a stroke if you have a medical condition called atrial fibrillation (a type of irregular heartbeat).  What do You need to know about Eliquis ? Take your Eliquis TWICE DAILY - one tablet in the morning and one tablet in the evening with or without food. If you have difficulty swallowing the tablet whole please discuss with your pharmacist how to take the medication safely.  Take Eliquis exactly as prescribed by your doctor and DO NOT stop taking Eliquis without talking to the doctor who prescribed the medication.  Stopping may increase your risk of developing a stroke.  Refill your prescription before you run out.  After discharge, you should have regular check-up appointments with your healthcare provider that is prescribing your Eliquis.  In the future your dose may need to be changed if your kidney function or weight changes by a significant amount or as you get older.  What do you do if you miss a dose? If you miss a dose, take it as soon as you remember on the same day and resume taking twice daily.  Do not take more than one dose of ELIQUIS at the same time to make up a missed dose.  Important Safety Information A possible side effect of Eliquis is bleeding. You should call your healthcare provider right away if you experience any of the following: ? Bleeding from an injury or your nose that does not stop. ? Unusual colored urine (red or dark brown) or unusual colored stools (red or black). ? Unusual bruising for unknown reasons. ? A serious fall or if you hit your head (even if there is no  bleeding).  Some medicines may interact with Eliquis and might increase your risk of bleeding or clotting while on Eliquis. To help avoid this, consult your healthcare provider or pharmacist prior to using any new prescription or non-prescription medications, including herbals, vitamins, non-steroidal anti-inflammatory drugs (NSAIDs) and supplements.  This website has more information on Eliquis (apixaban): http://www.eliquis.com/eliquis/home      Stroke Prevention Some medical conditions and behaviors are associated with an increased chance of having a stroke. You may prevent a stroke by making healthy choices and managing medical conditions. HOW CAN I REDUCE MY RISK OF HAVING A STROKE?   Stay physically active. Get at least 30 minutes of activity on most or all days.  Do not smoke. It may also be helpful to avoid exposure to secondhand smoke.  Limit alcohol use. Moderate alcohol use is considered to be:  No more than 2 drinks per day for men.  No more than 1 drink per day for nonpregnant women.  Eat healthy foods. This involves:  Eating 5 or more servings of fruits and vegetables a day.  Making dietary changes that address high blood pressure (hypertension), high cholesterol, diabetes, or obesity.  Manage your cholesterol levels.  Making food choices that are high in fiber and low in saturated fat, trans fat, and cholesterol may control cholesterol levels.  Take any prescribed medicines to control cholesterol as directed by your health care provider.  Manage your diabetes.  Controlling your carbohydrate and sugar intake is  recommended to manage diabetes.  Take any prescribed medicines to control diabetes as directed by your health care provider.  Control your hypertension.  Making food choices that are low in salt (sodium), saturated fat, trans fat, and cholesterol is recommended to manage hypertension.  Take any prescribed medicines to control hypertension as  directed by your health care provider.  Maintain a healthy weight.  Reducing calorie intake and making food choices that are low in sodium, saturated fat, trans fat, and cholesterol are recommended to manage weight.  Stop drug abuse.  Avoid taking birth control pills.  Talk to your health care provider about the risks of taking birth control pills if you are over 27 years old, smoke, get migraines, or have ever had a blood clot.  Get evaluated for sleep disorders (sleep apnea).  Talk to your health care provider about getting a sleep evaluation if you snore a lot or have excessive sleepiness.  Take medicines only as directed by your health care provider.  For some people, aspirin or blood thinners (anticoagulants) are helpful in reducing the risk of forming abnormal blood clots that can lead to stroke. If you have the irregular heart rhythm of atrial fibrillation, you should be on a blood thinner unless there is a good reason you cannot take them.  Understand all your medicine instructions.  Make sure that other conditions (such as anemia or atherosclerosis) are addressed. SEEK IMMEDIATE MEDICAL CARE IF:   You have sudden weakness or numbness of the face, arm, or leg, especially on one side of the body.  Your face or eyelid droops to one side.  You have sudden confusion.  You have trouble speaking (aphasia) or understanding.  You have sudden trouble seeing in one or both eyes.  You have sudden trouble walking.  You have dizziness.  You have a loss of balance or coordination.  You have a sudden, severe headache with no known cause.  You have new chest pain or an irregular heartbeat. Any of these symptoms may represent a serious problem that is an emergency. Do not wait to see if the symptoms will go away. Get medical help at once. Call your local emergency services (911 in [place] Do not drive yourself to the hospital. Document Released: 06/10/2004 Document Revised:  09/17/2013 Document Reviewed: 11/03/2012 [facility] Patient Information 2015 [place], [place]. This information is not intended to replace advice given to you by your health care provider. Make sure you discuss any questions you have with your health care provider. Atrial Fibrillation Atrial fibrillation is a type of irregular heart rhythm (arrhythmia). During atrial fibrillation, the upper chambers of the heart (atria) quiver continuously in a chaotic pattern. This causes an irregular and often rapid heart rate.  Atrial fibrillation is the result of the heart becoming overloaded with disorganized signals that tell it to beat. These signals are normally released one at a time by a part of the right atrium called the sinoatrial node. They then travel from the atria to the lower chambers of the heart (ventricles), causing the atria and ventricles to contract and pump blood as they pass. In atrial fibrillation, parts of the atria outside of the sinoatrial node also release these signals. This results in two problems. First, the atria receive so many signals that they do not have time to fully contract. Second, the ventricles, which can only receive one signal at a time, beat irregularly and out of rhythm with the atria.  There are three types of atrial fibrillation:  Paroxysmal. Paroxysmal atrial fibrillation starts suddenly and stops on its own within a week.  Persistent. Persistent atrial fibrillation lasts for more than a week. It may stop on its own or with treatment.  Permanent. Permanent atrial fibrillation does not go away. Episodes of atrial fibrillation may lead to permanent atrial fibrillation. Atrial fibrillation can prevent your heart from pumping blood normally. It increases your risk of stroke and can lead to heart failure.  CAUSES   Heart conditions, including a heart attack, heart failure, coronary artery disease, and heart valve conditions.   Inflammation of the sac that surrounds the  heart (pericarditis).  Blockage of an artery in the lungs (pulmonary embolism).  Pneumonia or other infections.  Chronic lung disease.  Thyroid problems, especially if the thyroid is overactive (hyperthyroidism).  Caffeine, excessive alcohol use, and use of some illegal drugs.   Use of some medicines, including certain decongestants and diet pills.  Heart surgery.   Birth defects.  Sometimes, no cause can be found. When this happens, the atrial fibrillation is called lone atrial fibrillation. The risk of complications from atrial fibrillation increases if you have lone atrial fibrillation and you are age 56 years or older. RISK FACTORS  Heart failure.  Coronary artery disease.  Diabetes mellitus.   High blood pressure (hypertension).   Obesity.   Other arrhythmias.   Increased age. SIGNS AND SYMPTOMS   A feeling that your heart is beating rapidly or irregularly.   A feeling of discomfort or pain in your chest.   Shortness of breath.   Sudden light-headedness or weakness.   Getting tired easily when exercising.   Urinating more often than normal (mainly when atrial fibrillation first begins).  In paroxysmal atrial fibrillation, symptoms may start and suddenly stop. DIAGNOSIS  Your health care provider may be able to detect atrial fibrillation when taking your pulse. Your health care provider may have you take a test called an ambulatory electrocardiogram (ECG). An ECG records your heartbeat patterns over a 24-hour period. You may also have other tests, such as:  Transthoracic echocardiogram (TTE). During echocardiography, sound waves are used to evaluate how blood flows through your heart.  Transesophageal echocardiogram (TEE).  Stress test. There is more than one type of stress test. If a stress test is needed, ask your health care provider about which type is best for you.  Chest X-ray exam.  Blood tests.  Computed tomography (CT). TREATMENT    Treatment may include:  Treating any underlying conditions. For example, if you have an overactive thyroid, treating the condition may correct atrial fibrillation.  Taking medicine. Medicines may be given to control a rapid heart rate or to prevent blood clots, heart failure, or a stroke.  Having a procedure to correct the rhythm of the heart:  Electrical cardioversion. During electrical cardioversion, a controlled, low-energy shock is delivered to the heart through your skin. If you have chest pain, very low blood pressure, or sudden heart failure, this procedure may need to be done as an emergency.  Catheter ablation. During this procedure, heart tissues that send the signals that cause atrial fibrillation are destroyed.  Surgical ablation. During this surgery, thin lines of heart tissue that carry the abnormal signals are destroyed. This procedure can either be an open-heart surgery or a minimally invasive surgery. With the minimally invasive surgery, small cuts are made to access the heart instead of a large opening.  Pulmonary venous isolation. During this surgery, tissue around the veins that carry blood from the  lungs (pulmonary veins) is destroyed. This tissue is thought to carry the abnormal signals. HOME CARE INSTRUCTIONS   Take medicines only as directed by your health care provider. Some medicines can make atrial fibrillation worse or recur.  If blood thinners were prescribed by your health care provider, take them exactly as directed. Too much blood-thinning medicine can cause bleeding. If you take too little, you will not have the needed protection against stroke and other problems.  Perform blood tests at home if directed by your health care provider. Perform blood tests exactly as directed.  Quit smoking if you smoke.  Do not drink alcohol.  Do not drink caffeinated beverages such as coffee, soda, and some teas. You may drink decaffeinated coffee, soda, or tea.    Maintain a healthy weight.Do not use diet pills unless your health care provider approves. They may make heart problems worse.   Follow diet instructions as directed by your health care provider.  Exercise regularly as directed by your health care provider.  Keep all follow-up visits as directed by your health care provider. This is important. PREVENTION  The following substances can cause atrial fibrillation to recur:   Caffeinated beverages.  Alcohol.  Certain medicines, especially those used for breathing problems.  Certain herbs and herbal medicines, such as those containing ephedra or ginseng.  Illegal drugs, such as cocaine and amphetamines. Sometimes medicines are given to prevent atrial fibrillation from recurring. Proper treatment of any underlying condition is also important in helping prevent recurrence.  SEEK MEDICAL CARE IF:  You notice a change in the rate, rhythm, or strength of your heartbeat.  You suddenly begin urinating more frequently.  You tire more easily when exerting yourself or exercising. SEEK IMMEDIATE MEDICAL CARE IF:   You have chest pain, abdominal pain, sweating, or weakness.  You feel nauseous.  You have shortness of breath.  You suddenly have swollen feet and ankles.  You feel dizzy.  Your face or limbs feel numb or weak.  You have a change in your vision or speech. MAKE SURE YOU:   Understand these instructions.  Will watch your condition.  Will get help right away if you are not doing well or get worse. Document Released: 05/03/2005 Document Revised: 09/17/2013 Document Reviewed: 06/13/2012 [facility] Patient Information 2015 [place], [place]. This information is not intended to replace advice given to you by your health care provider. Make sure you discuss any questions you have with your health care provider.

## 2014-07-30 NOTE — Progress Notes (Signed)
UR Completed Garon Melander Graves-Bigelow, RN,BSN [phone]  

## 2014-07-30 NOTE — Progress Notes (Signed)
[facility] TEAM 1 - Stepdown/ICU TEAM Progress Note  Kaitlyn [name] ZOX:[id number] DOB: Apr 08, 1951 DOA: 07/29/2014 PCP: No PCP Per Patient  Admit HPI / Brief Narrative: 64 y.o. WF PMHx  chronic atrial fibrillation, cardiomyopathy last EF measured was 15-20%, diabetes mellitus type 2 uncontrolled, and hypertension. Brought to the ER after patient was complaining of palpitations. Patient stated over 4-5 days patient had been having nausea vomiting and diarrhea. Her diarrhea improved but she started developing palpitations. Patient had not taken her medications for 10 days as she ran out of them. EMS gave a Cardizem bolus and in the ER patient was started on Cardizem infusion. Chest x-ray was unremarkable. Patient denied any chest pain or productive cough.   HPI/Subjective: 3/15  A/O 4, patient admits that she ran out of her medication approximately 10 days ago and was trying to make it until her next appointment at the clinic with Dr. Laurey Morale (cardiology). States she has no car, limited family/friends in the area; just moved here from [place]. Patient interested in the [facility] program if she qualifies.    Assessment/Plan:  Atrial fibrillation with RVR -Rate controlled  -See HTN  Nausea vomiting and diarrhea  -Resolved  Chronic systolic heart failure -last EF measured15-20% - baseline weight varies between 125-135kg -3/15 weight 127.9 kg  HTN -Continue metoprolol 100 mg BID -Continue lisinopril 5 mg daily -Continue digoxin 0.125 mg daily  DM type II uncontrolled -1/27 hemoglobin A1c= 8.3 -Currently having episodes of hypoglycemia on discharge would hold on restarting any medication.  Psychosocial -Placed consult for social work patient will need help in transferring her [place] Medicare Medicaid insurance down to [place] -Patient may qualify for [facility] program. Would ensure both of these were accomplished prior to discharge and patient to prevent another ounce  back.   Code Status: FULL Family Communication: no family present at time of exam Disposition Plan: Resolution of psychosocial problems    Consultants: NA  Procedure/Significant Events: 3/14 EKG showed A. fib with RVR   Culture   Antibiotics: Ceftin 3/15>> stop date 3/20   DVT prophylaxis: apixiban   Devices   LINES / TUBES:      Continuous Infusions: . dextrose 5 % 1,000 mL infusion 50 mL/hr at 07/30/14 0158  . diltiazem (CARDIZEM) infusion Stopped (07/29/14 2333)    Objective: VITAL SIGNS: Temp: 98.7 F (37.1 C) (03/15 2029) Temp Source: Oral (03/15 2029) BP: 99/75 mmHg (03/15 2029) Pulse Rate: 92 (03/15 2029) SPO2; FIO2:   Intake/Output Summary (Last 24 hours) at 07/30/14 2050 Last data filed at 07/30/14 0914  Gross per 24 hour  Intake    720 ml  Output   1100 ml  Net   -380 ml     Exam: General:  A/O 4, NAD, No acute respiratory distress Lungs: Clear to auscultation bilaterally without wheezes or crackles Cardiovascular:  irregular irregular rhythm and rate, without murmur gallop or rub normal S1 and S2 Abdomen: Nontender, nondistended, soft, bowel sounds positive, no rebound, no ascites, no appreciable mass Extremities: No significant cyanosis, clubbing, or edema bilateral lower extremities  Data Reviewed: Basic Metabolic Panel:  Recent Labs Lab 07/29/14 0351 07/29/14 0800 07/30/14 0506  NA 135 137 139  K 3.8 4.0 3.9  CL 105 106 107  CO2 GLUCOSE 284* 247* 46*  BUN CREATININE 1.09 1.10 1.12*  CALCIUM 8.7 8.8 8.6   Liver Function Tests:  Recent Labs Lab 07/29/14 0800  AST 13  ALT 11  ALKPHOS 43  BILITOT 1.8*  PROT 6.7  ALBUMIN 3.2*   No results for input(s): LIPASE, AMYLASE in the last 168 hours. No results for input(s): AMMONIA in the last 168 hours. CBC:  Recent Labs Lab 07/29/14 0351 07/29/14 0800 07/30/14 0506  WBC 12.6* 11.6* 12.3*  NEUTROABS  --  7.0  --   HGB 11.8* 11.6* 12.3   HCT 36.3 36.0 38.5  MCV 100.8* 102.6* 103.2*  PLT 189 174 191   Cardiac Enzymes:  Recent Labs Lab 07/29/14 0800  TROPONINI <0.03   BNP (last 3 results)  Recent Labs  06/12/14 2248 06/18/14 0616 07/03/14 1429  BNP 542.1* 904.1* 1055.2*    ProBNP (last 3 results)  Recent Labs  03/21/14 2126 03/22/14 1312 04/27/14 0128  PROBNP 2946.0* 3181.0* 5351.0*    CBG:  Recent Labs Lab 07/30/14 1400 07/30/14 1510 07/30/14 1622 07/30/14 1815 07/30/14 2019  GLUCAP 69* 76 55* 93 95    Recent Results (from the past 240 hour(s))  Clostridium Difficile by PCR     Status: None   Collection Time: 07/29/14  4:52 PM  Result Value Ref Range Status   C difficile by pcr NEGATIVE NEGATIVE Final  MRSA PCR Screening     Status: None   Collection Time: 07/29/14  6:30 PM  Result Value Ref Range Status   MRSA by PCR NEGATIVE NEGATIVE Final    Comment:        The GeneXpert MRSA Assay (FDA approved for NASAL specimens only), is one component of a comprehensive MRSA colonization surveillance program. It is not intended to diagnose MRSA infection nor to guide or monitor treatment for MRSA infections.      Studies:  Recent x-ray studies have been reviewed in detail by the Attending Physician  Scheduled Meds:  Scheduled Meds: . apixaban  5 mg Oral BID  . Chlorhexidine Gluconate Cloth  6 each Topical Q0600  . [START ON 07/31/2014] digoxin  0.125 mg Oral Daily  . feeding supplement (ENSURE COMPLETE)  237 mL Oral BID BM  . insulin aspart  0-5 Units Subcutaneous QHS  . insulin aspart  0-9 Units Subcutaneous TID WC  . lisinopril  5 mg Oral Daily  . metoprolol succinate  100 mg Oral BID  . potassium chloride SA  20 mEq Oral BID  . sodium chloride  3 mL Intravenous Q12H    Time spent on care of this patient: 40 mins   Lesia Monica, Roselind Messier , MD  [facility] Office  [phone] Pager - [phone]  On-Call/Text Page:      [email or web address]      password [email or web address]  If 7PM-7AM,  please contact night-coverage [email or web address] Password [email or web address] 07/30/2014, 8:50 PM   LOS: 1 day   Care during the described time interval was provided by me .  I have reviewed this patient's available data, including medical history, events of note, physical examination, radiology studies and test results as part of my evaluation  Carolyne Littles, MD [phone] Pager

## 2014-07-30 NOTE — Progress Notes (Signed)
Shift Event: Pt with hypoglycemia CBGs in 50s, despite D50 amp X3. Placed on d50 at 50cc/hr.   Illa Level [facility]

## 2014-07-30 NOTE — Care Management Note (Addendum)
    Page 1 of 2   08/01/2014     3:53:10 PM CARE MANAGEMENT NOTE 08/01/2014  Patient:  Kaitlyn [name], Kaitlyn [name]   Account Number:  [id number]  Date Initiated:  07/30/2014  Documentation initiated by:  GRAVES-BIGELOW,Neldon Shepard  Subjective/Objective Assessment:   Pt admitted for Palpitations. Last admission pt was set up with [facility] High Risk Initiative Program. Pt will need resumption orders for [facility].     Action/Plan:   Pt continues to have Medicaid out of State. CM has discussed several times with pt in regards to insurance to get that changed. No further needs from CM at this time.   Anticipated DC Date:  07/31/2014   Anticipated DC Plan:  HOME W HOME HEALTH SERVICES      DC Planning Services  CM consult  Follow-up appt scheduled  [facility]      [facility] Choice  HOME HEALTH  Resumption Of Svcs/PTA Provider   Choice offered to / List presented to:          [facility] arranged  HH-1 RN      [facility] agency  [facility]   Status of service:  Completed, signed off Medicare Important Message given?  NO (If response is "NO", the following Medicare IM given date fields will be blank) Date Medicare IM given:   Medicare IM given by:   Date Additional Medicare IM given:   Additional Medicare IM given by:    Discharge Disposition:  HOME W HOME HEALTH SERVICES  Per UR Regulation:  Reviewed for med. necessity/level of care/duration of stay  If discussed at Long Length of Stay Meetings, dates discussed:    Comments:   F/u appointment scheduled at the [facility] as well as the HF clinic. Tomi Bamberger, RN,BSN 08-01-14 1552   08-01-14 [address] [place] [phone] Frances Furbish notified that pt was going to be d/c today. CM spoke to [employer]. [facility] to provide transportation to pt. CM did speak with [facility] Liaison on 07-31-14 in ref to pt having Medicaid out of State. CM did call the [facility] department to make them aware as well. No further needs from CM at this  time.

## 2014-07-31 LAB — CBC WITH DIFFERENTIAL/PLATELET
Basophils Absolute: 0.1 10*3/uL (ref 0.0–0.1)
Basophils Relative: 1 % (ref 0–1)
Eosinophils Absolute: 2.1 10*3/uL — ABNORMAL HIGH (ref 0.0–0.7)
Eosinophils Relative: 19 % — ABNORMAL HIGH (ref 0–5)
HCT: 38.6 % (ref 36.0–46.0)
Hemoglobin: 12.2 g/dL (ref 12.0–15.0)
Lymphocytes Relative: 17 % (ref 12–46)
Lymphs Abs: 1.9 10*3/uL (ref 0.7–4.0)
MCH: 32.8 pg (ref 26.0–34.0)
MCHC: 31.6 g/dL (ref 30.0–36.0)
MCV: 103.8 fL — ABNORMAL HIGH (ref 78.0–100.0)
Monocytes Absolute: 0.8 10*3/uL (ref 0.1–1.0)
Monocytes Relative: 8 % (ref 3–12)
Neutro Abs: 6.1 10*3/uL (ref 1.7–7.7)
Neutrophils Relative %: 55 % (ref 43–77)
Platelets: 193 10*3/uL (ref 150–400)
RBC: 3.72 MIL/uL — ABNORMAL LOW (ref 3.87–5.11)
RDW: 15.9 % — ABNORMAL HIGH (ref 11.5–15.5)
WBC: 11 10*3/uL — ABNORMAL HIGH (ref 4.0–10.5)

## 2014-07-31 LAB — COMPREHENSIVE METABOLIC PANEL
ALT: 11 U/L (ref 0–35)
AST: 14 U/L (ref 0–37)
Albumin: 3 g/dL — ABNORMAL LOW (ref 3.5–5.2)
Alkaline Phosphatase: 39 U/L (ref 39–117)
Anion gap: 4 — ABNORMAL LOW (ref 5–15)
BUN: 13 mg/dL (ref 6–23)
CO2: 27 mmol/L (ref 19–32)
Calcium: 8.8 mg/dL (ref 8.4–10.5)
Chloride: 109 mmol/L (ref 96–112)
Creatinine, Ser: 1.16 mg/dL — ABNORMAL HIGH (ref 0.50–1.10)
GFR calc Af Amer: 56 mL/min — ABNORMAL LOW (ref >90)
GFR calc non Af Amer: 49 mL/min — ABNORMAL LOW (ref >90)
Glucose, Bld: 86 mg/dL (ref 70–99)
Potassium: 4.3 mmol/L (ref 3.5–5.1)
Sodium: 140 mmol/L (ref 135–145)
Total Bilirubin: 1.1 mg/dL (ref 0.3–1.2)
Total Protein: 6.1 g/dL (ref 6.0–8.3)

## 2014-07-31 LAB — GLUCOSE, CAPILLARY
Glucose-Capillary: 104 mg/dL — ABNORMAL HIGH (ref 70–99)
Glucose-Capillary: 109 mg/dL — ABNORMAL HIGH (ref 70–99)
Glucose-Capillary: 114 mg/dL — ABNORMAL HIGH (ref 70–99)
Glucose-Capillary: 130 mg/dL — ABNORMAL HIGH (ref 70–99)
Glucose-Capillary: 138 mg/dL — ABNORMAL HIGH (ref 70–99)
Glucose-Capillary: 97 mg/dL (ref 70–99)

## 2014-07-31 LAB — MAGNESIUM: Magnesium: 1.8 mg/dL (ref 1.5–2.5)

## 2014-07-31 MED ORDER — BISMUTH SUBSALICYLATE 262 MG PO CHEW
524.0000 mg | CHEWABLE_TABLET | ORAL | Status: DC | PRN
  Administered 2014-07-31 – 2014-08-01 (×2): 524 mg via ORAL
  Filled 2014-07-31 (×4): qty 2

## 2014-07-31 NOTE — Progress Notes (Signed)
Kaitlyn [name] TEAM 1 - Stepdown/ICU TEAM Progress Note  Kaitlyn [name] AOZ:[id number] DOB: [birth year] DOA: 07/29/2014 PCP: No PCP Per Patient  Admit HPI / Brief Narrative: 64 y.o. female with history of chronic atrial fibrillation, cardiomyopathy last EF measured was 15-20%, diabetes mellitus type 2, and hypertension who was brought to the ER after patient was complaining of palpitations. Patient stated over 4-5 days patient had been having nausea vomiting and diarrhea. Her diarrhea improved but she started developing palpitations. Patient had not taken her medications for 10 days as she ran out of them. EMS gave a Cardizem bolus and in the ER patient was started on Cardizem infusion. Chest x-ray was unremarkable. Patient denied any chest pain or productive cough.   HPI/Subjective: Pt is anxious to be d/c home.  When I explained to her that I wished to watch her CBGs and her renal function and additional 24hrs she became visibly angry.  She then accused me of lying to her, and "running a racket."  I explained that I simply wished to be very careful w/ her health, and that I did not wish to send her home if I was not absolutely sure she was safe for d/c.  W/ her hx of noncompliance w/ f/u appointments, and her lack of a PCP, I do not feel it is wise to d/c her w/ a crt that is climbing, and until we can be sure her CBG will remain stable for 24hrs off dextrose.  I have attempted to explain this to her in numerous ways, including showing her lab values on the computer in her room, but she persists in her accusations that I am lying to her.    Assessment/Plan:  Atrial fibrillation with RVR supposed to be on BB and digoxin as outpt - has a recurring hx of noncompliance leading to RVR - loaded w/ dig and resumed BB and weaned off CCB in setting of systolic CHF - TSH WNL  Nausea vomiting and diarrhea - possible UTI Much improved w/o use of abx - unfortunately it appears a urine culture was not obtained -  cont to follow w/o abx for now - repeat UA and cx if sx recur   Chronic systolic heart failure last EF measured15-20% - baseline weight varies between 125-135kg - currently 127.4kg - net negative 1.38L thus far - has some peripheral edema, but will avoid diuresis for now w/ ARI  HTN BP currently reasonably controlled   DM2 uncontrolled A1c 8.3 but having episodes of hypoglycemia in hospital - appears to have stabilized for now - d/c dextrose in IV and follow over night   Acute renal insufficiency crt has been climbing during this admission - likley related to poor CO in setting of RVR w/ hypotension - avoid potential nephrotoxins (stop ACE for now) and follow   Macrocytosis  Check B12 and folic acid levels   Code Status: FULL Family Communication: no family present at time of exam Disposition Plan: tele bed   Consultants: none  Procedures: none  Antibiotics: None  DVT prophylaxis: apixiban  Objective: Blood pressure 118/76, pulse 92, temperature 98.6 F (37 C), temperature source Oral, resp. rate 18, height  (1.803 m), weight 127.415 kg (280 lb 14.4 oz), SpO2 92 %.  Intake/Output Summary (Last 24 hours) at 07/31/14 1517 Last data filed at 07/31/14 0517  Gross per 24 hour  Intake      0 ml  Output   1000 ml  Net  -1000 ml    Exam:  General: No acute respiratory distress at rest  Lungs: Clear to auscultation bilaterally without wheezes or crackles Cardiovascular: Regular rate without murmur gallop or rub normal S1 and S2 Abdomen: Nontender, nondistended, overweight, soft, bowel sounds positive, no rebound, no ascites, no appreciable mass Extremities: No significant cyanosis, or clubbing;  1+ edema bilateral lower extremities   Data Reviewed: Basic Metabolic Panel:  Recent Labs Lab 07/29/14 0351 07/29/14 0800 07/30/14 0506 07/31/14 0335  NA 135 137 139 140  K 3.8 4.0 3.9 4.3  CL 105 106 107 109  CO2 19 21 25 27   GLUCOSE 284* 247* 46* 86  BUN 17 15  16 13   CREATININE 1.09 1.10 1.12* 1.16*  CALCIUM 8.7 8.8 8.6 8.8  MG  --   --   --  1.8    Liver Function Tests:  Recent Labs Lab 07/29/14 0800 07/31/14 0335  AST 13 14  ALT 11 11  ALKPHOS 43 39  BILITOT 1.8* 1.1  PROT 6.7 6.1  ALBUMIN 3.2* 3.0*   CBC:  Recent Labs Lab 07/29/14 0351 07/29/14 0800 07/30/14 0506 07/31/14 0335  WBC 12.6* 11.6* 12.3* 11.0*  NEUTROABS  --  7.0  --  6.1  HGB 11.8* 11.6* 12.3 12.2  HCT 36.3 36.0 38.5 38.6  MCV 100.8* 102.6* 103.2* 103.8*  PLT 189 174 191 193   Cardiac Enzymes:  Recent Labs Lab 07/29/14 0800  TROPONINI <0.03   CBG:  Recent Labs Lab 07/30/14 2235 07/31/14 0003 07/31/14 0402 07/31/14 0730 07/31/14 1127  GLUCAP 115* 114* 97 109* 130*   Studies:  Recent x-ray studies have been reviewed in detail by the Attending Physician  Scheduled Meds:  Scheduled Meds: . apixaban  5 mg Oral BID  . Chlorhexidine Gluconate Cloth  6 each Topical Q0600  . digoxin  0.125 mg Oral Daily  . feeding supplement (ENSURE COMPLETE)  237 mL Oral BID BM  . insulin aspart  0-5 Units Subcutaneous QHS  . insulin aspart  0-9 Units Subcutaneous TID WC  . lisinopril  5 mg Oral Daily  . metoprolol succinate  100 mg Oral BID  . potassium chloride SA  20 mEq Oral BID  . sodium chloride  3 mL Intravenous Q12H    Time spent on care of this patient: 35 mins   Kaitlyn [name] T , MD   [facility] Office  [phone] Pager - Text Page per Kaitlyn [name] as per below:  On-Call/Text Page:      [name]      password [email or web address]  If 7PM-7AM, please contact night-coverage [email or web address] Password [email or web address] 07/31/2014, 3:17 PM   LOS: 2 days

## 2014-07-31 NOTE — Progress Notes (Addendum)
CSW (Clinical Education officer, museum) received consult to assist pt with transportation resources as well as insurance issues to help avoid recurrent hospitalizations. CSWs and RNCMs have met with pt multiple times during previous admissions. Pt is aware of all resources as well as what needs to be done to switch to [facility] Medicaid. Pt continuously states she intends to switch insurance and will have funding to get transportation to MD appointments but does not follow up. CSW unable to assist any further, pt has been provided all available resource information.  [place], [place]

## 2014-07-31 NOTE — Progress Notes (Addendum)
I know Ms. Fiallo well from multiple previous hospital admissions.  She fails each time to complete outpatient follow-up--(even with transportation provided to and from appt).  She has missed 7 [facility] Appts either through calling and cancelling or just no-show as well as missing many various other appts since November 2015.[name]  She has been told that she must come in to be seen outpatient in order to effectively treat her outside of the hospital.  Each admission she runs out of medications prior to an outpatient appointment and then inevitably comes back into the ER.   I am unsure what more to offer her at this time to prevent readmission.  She is not a candidate for [facility] as she has out of state Medicaid and no current PCP.  She is very distrusting and will not allow "strangers" in her home.  She refused the paramedicine program after her last admission.  Myself and Lasandra Beech LCSW [facility]) will again attempt to gain her trust enough so that she may follow- up in our clinic post hospital stay.   Based on previous interactions and her lack of ability to follow- through I do believe that there may be some underlying psychiatric issues.

## 2014-08-01 DIAGNOSIS — D7589 Other specified diseases of blood and blood-forming organs: Secondary | ICD-10-CM

## 2014-08-01 DIAGNOSIS — Z9119 Patient's noncompliance with other medical treatment and regimen: Secondary | ICD-10-CM

## 2014-08-01 DIAGNOSIS — I10 Essential (primary) hypertension: Secondary | ICD-10-CM | POA: Diagnosis present

## 2014-08-01 DIAGNOSIS — N179 Acute kidney failure, unspecified: Secondary | ICD-10-CM | POA: Diagnosis present

## 2014-08-01 LAB — CBC
HCT: 38.6 % (ref 36.0–46.0)
Hemoglobin: 12.6 g/dL (ref 12.0–15.0)
MCH: 33.4 pg (ref 26.0–34.0)
MCHC: 32.6 g/dL (ref 30.0–36.0)
MCV: 102.4 fL — ABNORMAL HIGH (ref 78.0–100.0)
Platelets: 219 10*3/uL (ref 150–400)
RBC: 3.77 MIL/uL — ABNORMAL LOW (ref 3.87–5.11)
RDW: 15.9 % — ABNORMAL HIGH (ref 11.5–15.5)
WBC: 13.2 10*3/uL — ABNORMAL HIGH (ref 4.0–10.5)

## 2014-08-01 LAB — DIGOXIN LEVEL: Digoxin Level: 0.7 ng/mL — ABNORMAL LOW (ref 0.8–2.0)

## 2014-08-01 LAB — RENAL FUNCTION PANEL
Albumin: 3.4 g/dL — ABNORMAL LOW (ref 3.5–5.2)
Anion gap: 9 (ref 5–15)
BUN: 12 mg/dL (ref 6–23)
CO2: 23 mmol/L (ref 19–32)
Calcium: 9.2 mg/dL (ref 8.4–10.5)
Chloride: 104 mmol/L (ref 96–112)
Creatinine, Ser: 1.01 mg/dL (ref 0.50–1.10)
GFR calc Af Amer: 67 mL/min — ABNORMAL LOW (ref >90)
GFR calc non Af Amer: 58 mL/min — ABNORMAL LOW (ref >90)
Glucose, Bld: 222 mg/dL — ABNORMAL HIGH (ref 70–99)
Phosphorus: 4.5 mg/dL (ref 2.3–4.6)
Potassium: 3.9 mmol/L (ref 3.5–5.1)
Sodium: 136 mmol/L (ref 135–145)

## 2014-08-01 LAB — FERRITIN: Ferritin: 139 ng/mL (ref 10–291)

## 2014-08-01 LAB — RETICULOCYTES
RBC.: 3.78 MIL/uL — ABNORMAL LOW (ref 3.87–5.11)
Retic Count, Absolute: 94.5 10*3/uL (ref 19.0–186.0)
Retic Ct Pct: 2.5 % (ref 0.4–3.1)

## 2014-08-01 LAB — IRON AND TIBC
Iron: 53 ug/dL (ref 42–145)
Saturation Ratios: 17 % — ABNORMAL LOW (ref 20–55)
TIBC: 310 ug/dL (ref 250–470)
UIBC: 257 ug/dL (ref 125–400)

## 2014-08-01 LAB — FOLATE: Folate: 3.9 ng/mL

## 2014-08-01 LAB — GLUCOSE, CAPILLARY
Glucose-Capillary: 181 mg/dL — ABNORMAL HIGH (ref 70–99)
Glucose-Capillary: 152 mg/dL — ABNORMAL HIGH (ref 70–99)

## 2014-08-01 LAB — VITAMIN B12: Vitamin B-12: 271 pg/mL (ref 211–911)

## 2014-08-01 MED ORDER — GI COCKTAIL ~~LOC~~
30.0000 mL | Freq: Once | ORAL | Status: AC
  Administered 2014-08-01: 30 mL via ORAL
  Filled 2014-08-01: qty 30

## 2014-08-01 MED ORDER — POTASSIUM CHLORIDE CRYS ER 20 MEQ PO TBCR: 20.0000 meq | EXTENDED_RELEASE_TABLET | Freq: Two times a day (BID) | ORAL | Status: DC

## 2014-08-01 MED ORDER — APIXABAN 5 MG PO TABS: 5.0000 mg | ORAL_TABLET | Freq: Two times a day (BID) | ORAL | Status: DC

## 2014-08-01 MED ORDER — METOPROLOL SUCCINATE ER 100 MG PO TB24: 100.0000 mg | ORAL_TABLET | Freq: Two times a day (BID) | ORAL | Status: DC

## 2014-08-01 MED ORDER — DIGOXIN 125 MCG PO TABS: 0.1250 mg | ORAL_TABLET | Freq: Every day | ORAL | Status: DC

## 2014-08-01 NOTE — Discharge Summary (Addendum)
Physician Discharge Summary  Kaitlyn [name] ZHG:[id number] DOB: 10-13-1950 DOA: 07/29/2014  PCP: No PCP Per Patient  Admit date: 07/29/2014 Discharge date: 08/01/2014  Time spent: 40 minutes  Recommendations for Outpatient Follow-up:   Atrial fibrillation with RVR -Rate controlled  -See HTN  Nausea vomiting and diarrhea  -Resolved  Chronic systolic heart failure -06/13/14 LVEF= 20%  - baseline weight varies between 125-135kg -3/17 weight 127.9 kg  HTN -Continue metoprolol 100 mg BID -Continue digoxin 0.125 mg daily   DM type II uncontrolled -1/27 hemoglobin A1c= 8.3 -Episodes of hypoglycemia have resolved. Would continue to hold restarting any medication. -Patient to see PCP and discuss when she should restart her diabetic medication. Although Cr has normalized would avoid Metformin.   Acute renal insufficiency -Cr has been climbing during this admission, most likely secondary to poor CO in setting of RVR w/ hypotension  - avoid potential nephrotoxins (stop ACE for now) and followup with PCP -Cr has normalized.   Macrocytosis  Check B12 and folic acid levels   Noncompliance -I have explained at length to patient the sequela of continuing to not take medications as directed to include DKA, acute renal failure, and death  Psychosocial -Social work has spoken at [employer] with patient concerning need to transfer her [employer] insurance down to [place] -I have spoken with [employer] and patient is not a candidate for [facility] program at this time..  -Reinforced to patient that SHE MUST GET her Medicare/Medicaid transferred to [facility] or she may receive a bill from [employer].   Discharge Diagnoses:  Principal Problem:   Atrial fibrillation with RVR Active Problems:   Diabetes mellitus type 2, uncontrolled   Cardiomyopathy-EF 15-20% by echo Jan 2016   Fever   Nausea vomiting and diarrhea   Psychosocial impairment   Acute renal failure  syndrome   Noncompliance   Macrocytosis   Essential hypertension   Discharge Condition: Stable  Diet recommendation: Diabetic  Filed Weights   07/30/14 0400 07/31/14 0516 08/01/14 0543  Weight: 127.914 kg (282 lb) 127.415 kg (280 lb 14.4 oz) 127.506 kg (281 lb 1.6 oz)    History of present illness:  64 y.o. WF PMHx chronic atrial fibrillation, cardiomyopathy last EF measured was 15-20%, diabetes mellitus type 2 uncontrolled, and hypertension. Brought to the ER after patient was complaining of palpitations. Patient stated over 4-5 days patient had been having nausea vomiting and diarrhea. Her diarrhea improved but she started developing palpitations. Patient had not taken her medications for 10 days as she ran out of them. EMS gave a Cardizem bolus and in the ER patient was started on Cardizem infusion. Chest x-ray was unremarkable. Patient denied any chest pain or productive cough.  Due to patient's noncompliance patient was found to be in A. fib with RVR, acute renal failure, and systolic CHF. After patient was stabilized on her home medication regimen A. fib is now rate controlled, acute kidney injury resolved, and CBGs normalized.   Procedures: 3/14 EKG showed A. fib with RVR    Discharge Exam: Filed Vitals:   07/31/14 1950 08/01/14 0030 08/01/14 0543 08/01/14 0819  BP: 121/90 126/82    Pulse: 96 80    Temp: 98.4 F (36.9 C) 97.8 F (36.6 C) 98 F (36.7 C) 97.9 F (36.6 C)  TempSrc: Oral Oral Oral Oral  Resp: 19 18    Height:      Weight:   127.506 kg (281 lb 1.6 oz)   SpO2: 96% 98%  General: A/O 4, NAD, No acute respiratory distress Lungs: Clear to auscultation bilaterally without wheezes or crackles Cardiovascular: irregular irregular rhythm and rate, without murmur gallop or rub normal S1 and S2 Abdomen: Nontender, nondistended, soft, bowel sounds positive, no rebound, no ascites, no appreciable mass Extremities: No significant cyanosis, clubbing, or edema  bilateral lower extremities    Discharge Instructions     Medication List    STOP taking these medications        furosemide 40 MG tablet  Commonly known as:  LASIX     glyBURIDE 5 MG tablet  Commonly known as:  DIABETA     lisinopril 10 MG tablet  Commonly known as:  PRINIVIL,ZESTRIL     torsemide 20 MG tablet  Commonly known as:  DEMADEX      TAKE these medications        apixaban 5 MG Tabs tablet  Commonly known as:  ELIQUIS  Take 1 tablet (5 mg total) by mouth 2 (two) times daily.     digoxin 0.125 MG tablet  Commonly known as:  LANOXIN  Take 1 tablet (0.125 mg total) by mouth daily.     metoprolol succinate 100 MG 24 hr tablet  Commonly known as:  TOPROL-XL  Take 1 tablet (100 mg total) by mouth 2 (two) times daily. Take with or immediately following a meal.     potassium chloride SA 20 MEQ tablet  Commonly known as:  K-DUR,KLOR-CON  Take 1 tablet (20 mEq total) by mouth 2 (two) times daily.       Allergies  Allergen Reactions  . Caffeine Other (See Comments)    Seizure  . Penicillins Other (See Comments)    Childhood allery   Follow-up Information    Follow up with [name], MD On 08/12/2014.   Specialty:  Cardiology   Why:  at 0940 in the [facility]--gate code 7000- bring all medications   Contact information:   [address] [place] [address] [phone]       Follow up with [facility].   Specialty:  Home Health Services   Why:  Home Health Registered Nurse Via the [facility] Risk Initiative Program   Contact information:   [address] [place] [address] [place] [address] [phone]       Follow up with [facility]     On 08/05/2014.   Why:  Hospital Follow up @ 10:30am-    Contact information:   [address] [place] [address] [phone]       The results of significant diagnostics from this hospitalization (including  imaging, microbiology, ancillary and laboratory) are listed below for reference.    Significant Diagnostic Studies: Dg Chest Port 1 View  07/29/2014   CLINICAL DATA:  Dyspnea.  Atrial fibrillation.  EXAM: PORTABLE CHEST - 1 VIEW  COMPARISON:  07/03/2014  FINDINGS: There is moderate cardiomegaly, unchanged. The lungs are clear. There are no large effusions. Pulmonary vasculature appears normal.  IMPRESSION: Cardiomegaly.   Electronically Signed   By: Ellery Plunk M.D.   On: 07/29/2014 04:13   Dg Chest Port 1 View  07/03/2014   CLINICAL DATA:  Shortness of breath  EXAM: PORTABLE CHEST - 1 VIEW  COMPARISON:  06/18/2014  FINDINGS: Cardiac shadow remains enlarged. Increasing vascular congestion as well as right basilar atelectasis and effusion is noted. No acute bony abnormality is seen.  IMPRESSION: Worsening CHF with associated  right basilar atelectasis.   Electronically Signed   By: Alcide Clever M.D.   On: 07/03/2014 15:36    Microbiology: Recent Results (from the past 240 hour(s))  Stool culture     Status: None (Preliminary result)   Collection Time: 07/29/14  4:52 PM  Result Value Ref Range Status   Specimen Description STOOL  Final   Special Requests NONE  Final   Culture   Final    NO SUSPICIOUS COLONIES, CONTINUING TO HOLD Note: REDUCED NORMAL FLORA PRESENT Performed at [facility]    Report Status PENDING  Incomplete  Clostridium Difficile by PCR     Status: None   Collection Time: 07/29/14  4:52 PM  Result Value Ref Range Status   C difficile by pcr NEGATIVE NEGATIVE Final  MRSA PCR Screening     Status: None   Collection Time: 07/29/14  6:30 PM  Result Value Ref Range Status   MRSA by PCR NEGATIVE NEGATIVE Final    Comment:        The GeneXpert MRSA Assay (FDA approved for NASAL specimens only), is one component of a comprehensive MRSA colonization surveillance program. It is not intended to diagnose MRSA infection nor to guide or monitor treatment  for MRSA infections.      Labs: Basic Metabolic Panel:  Recent Labs Lab 07/29/14 0351 07/29/14 0800 07/30/14 0506 07/31/14 0335 08/01/14 0418  NA 135 137 139 140 136  K 3.8 4.0 3.9 4.3 3.9  CL 105 106 107 109 104  CO2 GLUCOSE 284* 247* 46* 86 222*  BUN CREATININE 1.09 1.10 1.12* 1.16* 1.01  CALCIUM 8.7 8.8 8.6 8.8 9.2  MG  --   --   --  1.8  --   PHOS  --   --   --   --  4.5   Liver Function Tests:  Recent Labs Lab 07/29/14 0800 07/31/14 0335 08/01/14 0418  AST 13 14  --   ALT 11 11  --   ALKPHOS 43 39  --   BILITOT 1.8* 1.1  --   PROT 6.7 6.1  --   ALBUMIN 3.2* 3.0* 3.4*   No results for input(s): LIPASE, AMYLASE in the last 168 hours. No results for input(s): AMMONIA in the last 168 hours. CBC:  Recent Labs Lab 07/29/14 0351 07/29/14 0800 07/30/14 0506 07/31/14 0335 08/01/14 0418  WBC 12.6* 11.6* 12.3* 11.0* 13.2*  NEUTROABS  --  7.0  --  6.1  --   HGB 11.8* 11.6* 12.3 12.2 12.6  HCT 36.3 36.0 38.5 38.6 38.6  MCV 100.8* 102.6* 103.2* 103.8* 102.4*  PLT 189 174 191 193 219   Cardiac Enzymes:  Recent Labs Lab 07/29/14 0800  TROPONINI <0.03   BNP: BNP (last 3 results)  Recent Labs  06/12/14 0632 06/18/14 0616 07/03/14 1429  BNP 542.1* 904.1* 1055.2*    ProBNP (last 3 results)  Recent Labs  03/21/14 2126 03/22/14 1312 04/27/14 0128  PROBNP 2946.0* 3181.0* 5351.0*    CBG:  Recent Labs Lab 07/31/14 1127 07/31/14 1621 07/31/14 2055 08/01/14 0735 08/01/14 1221  GLUCAP 130* 104* 138* 181* 152*       Signed:  Carolyne Littles, MD [facility] [phone] pager

## 2014-08-01 NOTE — Progress Notes (Signed)
Notified this afternoon by nursing that patient was scheduled for d/c home and would require EMS transport. CSW arranged for EMS transport and appropriate forms completed. Nursing notified; patient is aware. She related to RN that she had a key to get into her home as she lives alone.  CSW signing off.  Lorri Frederick. Jaci Lazier, [place] [phone]

## 2014-08-02 ENCOUNTER — Telehealth (HOSPITAL_COMMUNITY): Payer: Self-pay | Admitting: Surgery

## 2014-08-02 LAB — STOOL CULTURE

## 2014-08-02 NOTE — Telephone Encounter (Signed)
I called to try to reach Kaitlyn [name] regarding her recent hospital stay and discharge medications.  The phone number provided is not currently a working number.  I have also attempted the alternate number listed and left a message on that voicemail to indicate that I needed to reach [name] regarding her medications.

## 2014-08-02 NOTE — Telephone Encounter (Signed)
Received a call back from Ms. Stoltzfus's cousin's wife (Ms. Ethelene Browns) indicating that she could not reach her either.  She has called the person who runs the apartment complex where Ms. Layson lives to check on her to be sure she is ok.  If and when she speaks with Ms. Kubly I have asked her to have her call me so that we can discuss her home medications.  She agrees that she will relay that message.

## 2014-08-03 ENCOUNTER — Inpatient Hospital Stay (HOSPITAL_COMMUNITY)
Admission: EM | Admit: 2014-08-03 | Discharge: 2014-08-06 | DRG: 309 | Disposition: A | Discharge status: Home / Self Care | Payer: Medicaid - Out of State | Attending: Internal Medicine | Admitting: Internal Medicine

## 2014-08-03 ENCOUNTER — Encounter (HOSPITAL_COMMUNITY): Payer: Self-pay | Admitting: Emergency Medicine

## 2014-08-03 ENCOUNTER — Emergency Department (HOSPITAL_COMMUNITY): Payer: Medicaid - Out of State

## 2014-08-03 DIAGNOSIS — E1165 Type 2 diabetes mellitus with hyperglycemia: Secondary | ICD-10-CM | POA: Diagnosis present

## 2014-08-03 DIAGNOSIS — I4891 Unspecified atrial fibrillation: Principal | ICD-10-CM | POA: Diagnosis present

## 2014-08-03 DIAGNOSIS — G4700 Insomnia, unspecified: Secondary | ICD-10-CM | POA: Diagnosis present

## 2014-08-03 DIAGNOSIS — J111 Influenza due to unidentified influenza virus with other respiratory manifestations: Secondary | ICD-10-CM | POA: Diagnosis present

## 2014-08-03 DIAGNOSIS — F329 Major depressive disorder, single episode, unspecified: Secondary | ICD-10-CM | POA: Diagnosis present

## 2014-08-03 DIAGNOSIS — I429 Cardiomyopathy, unspecified: Secondary | ICD-10-CM

## 2014-08-03 DIAGNOSIS — Z91199 Patient's noncompliance with other medical treatment and regimen due to unspecified reason: Secondary | ICD-10-CM

## 2014-08-03 DIAGNOSIS — Z6839 Body mass index (BMI) 39.0-39.9, adult: Secondary | ICD-10-CM

## 2014-08-03 DIAGNOSIS — D696 Thrombocytopenia, unspecified: Secondary | ICD-10-CM | POA: Diagnosis present

## 2014-08-03 DIAGNOSIS — Z880 Allergy status to penicillin: Secondary | ICD-10-CM

## 2014-08-03 DIAGNOSIS — I5022 Chronic systolic (congestive) heart failure: Secondary | ICD-10-CM | POA: Diagnosis present

## 2014-08-03 DIAGNOSIS — E669 Obesity, unspecified: Secondary | ICD-10-CM | POA: Diagnosis present

## 2014-08-03 DIAGNOSIS — Z9114 Patient's other noncompliance with medication regimen: Secondary | ICD-10-CM | POA: Diagnosis present

## 2014-08-03 DIAGNOSIS — Z9119 Patient's noncompliance with other medical treatment and regimen: Secondary | ICD-10-CM | POA: Diagnosis present

## 2014-08-03 DIAGNOSIS — M1990 Unspecified osteoarthritis, unspecified site: Secondary | ICD-10-CM | POA: Diagnosis present

## 2014-08-03 DIAGNOSIS — I10 Essential (primary) hypertension: Secondary | ICD-10-CM | POA: Diagnosis present

## 2014-08-03 LAB — CBC WITH DIFFERENTIAL/PLATELET
Basophils Absolute: 0.1 10*3/uL (ref 0.0–0.1)
Basophils Relative: 1 % (ref 0–1)
Eosinophils Absolute: 0.8 10*3/uL — ABNORMAL HIGH (ref 0.0–0.7)
Eosinophils Relative: 11 % — ABNORMAL HIGH (ref 0–5)
HCT: 38.1 % (ref 36.0–46.0)
Hemoglobin: 12.2 g/dL (ref 12.0–15.0)
Lymphocytes Relative: 12 % (ref 12–46)
Lymphs Abs: 0.9 10*3/uL (ref 0.7–4.0)
MCH: 32.6 pg (ref 26.0–34.0)
MCHC: 32 g/dL (ref 30.0–36.0)
MCV: 101.9 fL — ABNORMAL HIGH (ref 78.0–100.0)
Monocytes Absolute: 0.8 10*3/uL (ref 0.1–1.0)
Monocytes Relative: 11 % (ref 3–12)
Neutro Abs: 5 10*3/uL (ref 1.7–7.7)
Neutrophils Relative %: 65 % (ref 43–77)
Platelets: 172 10*3/uL (ref 150–400)
RBC: 3.74 MIL/uL — ABNORMAL LOW (ref 3.87–5.11)
RDW: 15.4 % (ref 11.5–15.5)
WBC: 7.6 10*3/uL (ref 4.0–10.5)

## 2014-08-03 LAB — BASIC METABOLIC PANEL
Anion gap: 6 (ref 5–15)
BUN: 9 mg/dL (ref 6–23)
CO2: 28 mmol/L (ref 19–32)
Calcium: 8.7 mg/dL (ref 8.4–10.5)
Chloride: 101 mmol/L (ref 96–112)
Creatinine, Ser: 1.04 mg/dL (ref 0.50–1.10)
GFR calc Af Amer: 64 mL/min — ABNORMAL LOW (ref >90)
GFR calc non Af Amer: 56 mL/min — ABNORMAL LOW (ref >90)
Glucose, Bld: 182 mg/dL — ABNORMAL HIGH (ref 70–99)
Potassium: 3.9 mmol/L (ref 3.5–5.1)
Sodium: 135 mmol/L (ref 135–145)

## 2014-08-03 LAB — I-STAT TROPONIN, ED: Troponin i, poc: 0.01 ng/mL (ref 0.00–0.08)

## 2014-08-03 LAB — DIGOXIN LEVEL: Digoxin Level: 0.2 ng/mL — ABNORMAL LOW (ref 0.8–2.0)

## 2014-08-03 MED ORDER — METOPROLOL TARTRATE 1 MG/ML IV SOLN: 5.0000 mg | Freq: Once | INTRAVENOUS | Status: DC

## 2014-08-03 MED ORDER — APIXABAN 5 MG PO TABS
5.0000 mg | ORAL_TABLET | Freq: Once | ORAL | Status: AC
  Administered 2014-08-03: 5 mg via ORAL
  Filled 2014-08-03: qty 1

## 2014-08-03 MED ORDER — METOPROLOL TARTRATE 1 MG/ML IV SOLN
5.0000 mg | INTRAVENOUS | Status: AC | PRN
  Administered 2014-08-03 – 2014-08-05 (×3): 5 mg via INTRAVENOUS
  Filled 2014-08-03 (×4): qty 5

## 2014-08-03 MED ORDER — DIGOXIN 125 MCG PO TABS
0.1250 mg | ORAL_TABLET | Freq: Once | ORAL | Status: AC
  Administered 2014-08-03: 0.125 mg via ORAL
  Filled 2014-08-03: qty 1

## 2014-08-03 MED ORDER — METOPROLOL SUCCINATE ER 25 MG PO TB24
100.0000 mg | ORAL_TABLET | Freq: Once | ORAL | Status: AC
Start: 2014-08-03 — End: 2014-08-03
  Administered 2014-08-03: 100 mg via ORAL
  Filled 2014-08-03: qty 4
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
## 2014-08-03 NOTE — ED Notes (Signed)
Per EMS: Pt was recently admitted to the hospital for A-fib.  Pt discharged on Wednesday from the hospital.  Pt has been out of home meds ([place], Digoxin and Metoprolol) for 2 days now.  Pt sts she is unable to afford her medication until next week.  Pt sts she began to have a sore throat and cough today, but during EMS transport began to have HR from 150-220 with a-fib underlying.  All other vitals stable.  Pt alert and oriented in room with HR 152

## 2014-08-03 NOTE — ED Notes (Signed)
Pt sts "I can't breathe".  When asked why she feels that way pt sts "my nose is stuffed up, and I think my a-fib is making it hard to breathe".  Pt saturations 91-95% on RA.  Pt placed on 1L [place] for comfort and reassured of ability to breathe and move oxygen

## 2014-08-04 ENCOUNTER — Encounter (HOSPITAL_COMMUNITY): Payer: Self-pay | Admitting: General Practice

## 2014-08-04 DIAGNOSIS — Z880 Allergy status to penicillin: Secondary | ICD-10-CM | POA: Diagnosis not present

## 2014-08-04 DIAGNOSIS — I10 Essential (primary) hypertension: Secondary | ICD-10-CM | POA: Diagnosis present

## 2014-08-04 DIAGNOSIS — G4700 Insomnia, unspecified: Secondary | ICD-10-CM | POA: Diagnosis present

## 2014-08-04 DIAGNOSIS — I5022 Chronic systolic (congestive) heart failure: Secondary | ICD-10-CM | POA: Diagnosis present

## 2014-08-04 DIAGNOSIS — E1165 Type 2 diabetes mellitus with hyperglycemia: Secondary | ICD-10-CM

## 2014-08-04 DIAGNOSIS — I429 Cardiomyopathy, unspecified: Secondary | ICD-10-CM

## 2014-08-04 DIAGNOSIS — E669 Obesity, unspecified: Secondary | ICD-10-CM | POA: Diagnosis present

## 2014-08-04 DIAGNOSIS — R0602 Shortness of breath: Secondary | ICD-10-CM | POA: Diagnosis not present

## 2014-08-04 DIAGNOSIS — D696 Thrombocytopenia, unspecified: Secondary | ICD-10-CM | POA: Diagnosis present

## 2014-08-04 DIAGNOSIS — M1990 Unspecified osteoarthritis, unspecified site: Secondary | ICD-10-CM | POA: Diagnosis present

## 2014-08-04 DIAGNOSIS — Z9119 Patient's noncompliance with other medical treatment and regimen: Secondary | ICD-10-CM | POA: Diagnosis present

## 2014-08-04 DIAGNOSIS — F329 Major depressive disorder, single episode, unspecified: Secondary | ICD-10-CM | POA: Diagnosis present

## 2014-08-04 DIAGNOSIS — I4891 Unspecified atrial fibrillation: Principal | ICD-10-CM

## 2014-08-04 DIAGNOSIS — J111 Influenza due to unidentified influenza virus with other respiratory manifestations: Secondary | ICD-10-CM | POA: Diagnosis present

## 2014-08-04 DIAGNOSIS — Z6839 Body mass index (BMI) 39.0-39.9, adult: Secondary | ICD-10-CM | POA: Diagnosis not present

## 2014-08-04 DIAGNOSIS — Z9114 Patient's other noncompliance with medication regimen: Secondary | ICD-10-CM | POA: Diagnosis present

## 2014-08-04 LAB — BASIC METABOLIC PANEL
Anion gap: 9 (ref 5–15)
BUN: 8 mg/dL (ref 6–23)
CO2: 24 mmol/L (ref 19–32)
Calcium: 8.6 mg/dL (ref 8.4–10.5)
Chloride: 103 mmol/L (ref 96–112)
Creatinine, Ser: 0.96 mg/dL (ref 0.50–1.10)
GFR calc Af Amer: 71 mL/min — ABNORMAL LOW (ref >90)
GFR calc non Af Amer: 61 mL/min — ABNORMAL LOW (ref >90)
Glucose, Bld: 164 mg/dL — ABNORMAL HIGH (ref 70–99)
Potassium: 4.1 mmol/L (ref 3.5–5.1)
Sodium: 136 mmol/L (ref 135–145)

## 2014-08-04 LAB — CBC
HCT: 37.1 % (ref 36.0–46.0)
Hemoglobin: 11.8 g/dL — ABNORMAL LOW (ref 12.0–15.0)
MCH: 33.2 pg (ref 26.0–34.0)
MCHC: 31.8 g/dL (ref 30.0–36.0)
MCV: 104.5 fL — ABNORMAL HIGH (ref 78.0–100.0)
Platelets: 144 10*3/uL — ABNORMAL LOW (ref 150–400)
RBC: 3.55 MIL/uL — ABNORMAL LOW (ref 3.87–5.11)
RDW: 15.6 % — ABNORMAL HIGH (ref 11.5–15.5)
WBC: 7.1 10*3/uL (ref 4.0–10.5)

## 2014-08-04 MED ORDER — SODIUM CHLORIDE 0.9 % IJ SOLN: 3.0000 mL | INTRAMUSCULAR | Status: DC | PRN

## 2014-08-04 MED ORDER — ONDANSETRON HCL 4 MG PO TABS
4.0000 mg | ORAL_TABLET | Freq: Four times a day (QID) | ORAL | Status: DC | PRN
  Administered 2014-08-04 – 2014-08-05 (×2): 4 mg via ORAL
  Filled 2014-08-04 (×2): qty 1

## 2014-08-04 MED ORDER — HYDROMORPHONE HCL 1 MG/ML IJ SOLN: 0.5000 mg | INTRAMUSCULAR | Status: DC | PRN

## 2014-08-04 MED ORDER — METOPROLOL SUCCINATE ER 100 MG PO TB24
100.0000 mg | ORAL_TABLET | Freq: Two times a day (BID) | ORAL | Status: DC
  Administered 2014-08-04 – 2014-08-06 (×5): 100 mg via ORAL
  Filled 2014-08-04 (×7): qty 1

## 2014-08-04 MED ORDER — LORATADINE 10 MG PO TABS
10.0000 mg | ORAL_TABLET | Freq: Every day | ORAL | Status: DC
  Administered 2014-08-05 – 2014-08-06 (×2): 10 mg via ORAL
  Filled 2014-08-04 (×2): qty 1

## 2014-08-04 MED ORDER — METOPROLOL TARTRATE 1 MG/ML IV SOLN
2.5000 mg | Freq: Four times a day (QID) | INTRAVENOUS | Status: DC
  Administered 2014-08-04 (×3): 2.5 mg via INTRAVENOUS
  Filled 2014-08-04 (×8): qty 5

## 2014-08-04 MED ORDER — OXYCODONE HCL 5 MG PO TABS: 5.0000 mg | ORAL_TABLET | ORAL | Status: DC | PRN

## 2014-08-04 MED ORDER — ONDANSETRON HCL 4 MG/2ML IJ SOLN: 4.0000 mg | Freq: Four times a day (QID) | INTRAMUSCULAR | Status: DC | PRN

## 2014-08-04 MED ORDER — ACETAMINOPHEN 650 MG RE SUPP: 650.0000 mg | Freq: Four times a day (QID) | RECTAL | Status: DC | PRN

## 2014-08-04 MED ORDER — ALUM & MAG HYDROXIDE-SIMETH 200-200-20 MG/5ML PO SUSP: 30.0000 mL | Freq: Four times a day (QID) | ORAL | Status: DC | PRN

## 2014-08-04 MED ORDER — APIXABAN 5 MG PO TABS
5.0000 mg | ORAL_TABLET | Freq: Two times a day (BID) | ORAL | Status: DC
  Administered 2014-08-04 – 2014-08-06 (×5): 5 mg via ORAL
  Filled 2014-08-04 (×7): qty 1

## 2014-08-04 MED ORDER — SODIUM CHLORIDE 0.9 % IJ SOLN
3.0000 mL | Freq: Two times a day (BID) | INTRAMUSCULAR | Status: DC
  Administered 2014-08-04 – 2014-08-05 (×3): 3 mL via INTRAVENOUS

## 2014-08-04 MED ORDER — FLUTICASONE PROPIONATE 50 MCG/ACT NA SUSP
1.0000 | Freq: Every day | NASAL | Status: DC
  Administered 2014-08-04 – 2014-08-06 (×3): 1 via NASAL
  Filled 2014-08-04: qty 16

## 2014-08-04 MED ORDER — SODIUM CHLORIDE 0.9 % IV SOLN: 250.0000 mL | INTRAVENOUS | Status: DC | PRN

## 2014-08-04 MED ORDER — SODIUM CHLORIDE 0.9 % IJ SOLN
3.0000 mL | Freq: Two times a day (BID) | INTRAMUSCULAR | Status: DC
  Administered 2014-08-04 – 2014-08-05 (×4): 3 mL via INTRAVENOUS

## 2014-08-04 MED ORDER — ACETAMINOPHEN 325 MG PO TABS
650.0000 mg | ORAL_TABLET | Freq: Four times a day (QID) | ORAL | Status: DC | PRN
  Administered 2014-08-05 – 2014-08-06 (×2): 650 mg via ORAL
  Filled 2014-08-04 (×2): qty 2

## 2014-08-04 NOTE — Progress Notes (Signed)
Pt complained of cold symptoms, and feels "stopped up."  Paged Dr. Blake Divine, and requested something for a cold.  VSS, and is afebrile.
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
## 2014-08-04 NOTE — Progress Notes (Signed)
UR completed 

## 2014-08-04 NOTE — Discharge Instructions (Addendum)
Stroke Prevention Some medical conditions and behaviors are associated with an increased chance of having a stroke. You may prevent a stroke by making healthy choices and managing medical conditions. HOW CAN I REDUCE MY RISK OF HAVING A STROKE?   Stay physically active. Get at least 30 minutes of activity on most or all days.  Do not smoke. It may also be helpful to avoid exposure to secondhand smoke.  Limit alcohol use. Moderate alcohol use is considered to be:  No more than 2 drinks per day for men.  No more than 1 drink per day for nonpregnant women.  Eat healthy foods. This involves:  Eating 5 or more servings of fruits and vegetables a day.  Making dietary changes that address high blood pressure (hypertension), high cholesterol, diabetes, or obesity.  Manage your cholesterol levels.  Making food choices that are high in fiber and low in saturated fat, trans fat, and cholesterol may control cholesterol levels.  Take any prescribed medicines to control cholesterol as directed by your health care provider.  Manage your diabetes.  Controlling your carbohydrate and sugar intake is recommended to manage diabetes.  Take any prescribed medicines to control diabetes as directed by your health care provider.  Control your hypertension.  Making food choices that are low in salt (sodium), saturated fat, trans fat, and cholesterol is recommended to manage hypertension.  Take any prescribed medicines to control hypertension as directed by your health care provider.  Maintain a healthy weight.  Reducing calorie intake and making food choices that are low in sodium, saturated fat, trans fat, and cholesterol are recommended to manage weight.  Stop drug abuse.  Avoid taking birth control pills.  Talk to your health care provider about the risks of taking birth control pills if you are over 7 years old, smoke, get migraines, or have ever had a blood clot.  Get evaluated for sleep  disorders (sleep apnea).  Talk to your health care provider about getting a sleep evaluation if you snore a lot or have excessive sleepiness.  Take medicines only as directed by your health care provider.  For some people, aspirin or blood thinners (anticoagulants) are helpful in reducing the risk of forming abnormal blood clots that can lead to stroke. If you have the irregular heart rhythm of atrial fibrillation, you should be on a blood thinner unless there is a good reason you cannot take them.  Understand all your medicine instructions.  Make sure that other conditions (such as anemia or atherosclerosis) are addressed. SEEK IMMEDIATE MEDICAL CARE IF:   You have sudden weakness or numbness of the face, arm, or leg, especially on one side of the body.  Your face or eyelid droops to one side.  You have sudden confusion.  You have trouble speaking (aphasia) or understanding.  You have sudden trouble seeing in one or both eyes.  You have sudden trouble walking.  You have dizziness.  You have a loss of balance or coordination.  You have a sudden, severe headache with no known cause.  You have new chest pain or an irregular heartbeat. Any of these symptoms may represent a serious problem that is an emergency. Do not wait to see if the symptoms will go away. Get medical help at once. Call your local emergency services (911 in [place] Do not drive yourself to the hospital. Document Released: 06/10/2004 Document Revised: 09/17/2013 Document Reviewed: 11/03/2012 [facility] Patient Information 2015 [place], [place]. This information is not intended to replace advice given  to you by your health care provider. Make sure you discuss any questions you have with your health care provider. Atrial Fibrillation Atrial fibrillation is a type of irregular heart rhythm (arrhythmia). During atrial fibrillation, the upper chambers of the heart (atria) quiver continuously in a chaotic pattern. This causes  an irregular and often rapid heart rate.  Atrial fibrillation is the result of the heart becoming overloaded with disorganized signals that tell it to beat. These signals are normally released one at a time by a part of the right atrium called the sinoatrial node. They then travel from the atria to the lower chambers of the heart (ventricles), causing the atria and ventricles to contract and pump blood as they pass. In atrial fibrillation, parts of the atria outside of the sinoatrial node also release these signals. This results in two problems. First, the atria receive so many signals that they do not have time to fully contract. Second, the ventricles, which can only receive one signal at a time, beat irregularly and out of rhythm with the atria.  There are three types of atrial fibrillation:   Paroxysmal. Paroxysmal atrial fibrillation starts suddenly and stops on its own within a week.  Persistent. Persistent atrial fibrillation lasts for more than a week. It may stop on its own or with treatment.  Permanent. Permanent atrial fibrillation does not go away. Episodes of atrial fibrillation may lead to permanent atrial fibrillation. Atrial fibrillation can prevent your heart from pumping blood normally. It increases your risk of stroke and can lead to heart failure.  CAUSES   Heart conditions, including a heart attack, heart failure, coronary artery disease, and heart valve conditions.   Inflammation of the sac that surrounds the heart (pericarditis).  Blockage of an artery in the lungs (pulmonary embolism).  Pneumonia or other infections.  Chronic lung disease.  Thyroid problems, especially if the thyroid is overactive (hyperthyroidism).  Caffeine, excessive alcohol use, and use of some illegal drugs.   Use of some medicines, including certain decongestants and diet pills.  Heart surgery.   Birth defects.  Sometimes, no cause can be found. When this happens, the atrial  fibrillation is called lone atrial fibrillation. The risk of complications from atrial fibrillation increases if you have lone atrial fibrillation and you are age 55 years or older. RISK FACTORS  Heart failure.  Coronary artery disease.  Diabetes mellitus.   High blood pressure (hypertension).   Obesity.   Other arrhythmias.   Increased age. SIGNS AND SYMPTOMS   A feeling that your heart is beating rapidly or irregularly.   A feeling of discomfort or pain in your chest.   Shortness of breath.   Sudden light-headedness or weakness.   Getting tired easily when exercising.   Urinating more often than normal (mainly when atrial fibrillation first begins).  In paroxysmal atrial fibrillation, symptoms may start and suddenly stop. DIAGNOSIS  Your health care provider may be able to detect atrial fibrillation when taking your pulse. Your health care provider may have you take a test called an ambulatory electrocardiogram (ECG). An ECG records your heartbeat patterns over a 24-hour period. You may also have other tests, such as:  Transthoracic echocardiogram (TTE). During echocardiography, sound waves are used to evaluate how blood flows through your heart.  Transesophageal echocardiogram (TEE).  Stress test. There is more than one type of stress test. If a stress test is needed, ask your health care provider about which type is best for you.  Chest X-ray exam.  Blood tests.  Computed tomography (CT). TREATMENT  Treatment may include:  Treating any underlying conditions. For example, if you have an overactive thyroid, treating the condition may correct atrial fibrillation.  Taking medicine. Medicines may be given to control a rapid heart rate or to prevent blood clots, heart failure, or a stroke.  Having a procedure to correct the rhythm of the heart:  Electrical cardioversion. During electrical cardioversion, a controlled, low-energy shock is delivered to the  heart through your skin. If you have chest pain, very low blood pressure, or sudden heart failure, this procedure may need to be done as an emergency.  Catheter ablation. During this procedure, heart tissues that send the signals that cause atrial fibrillation are destroyed.  Surgical ablation. During this surgery, thin lines of heart tissue that carry the abnormal signals are destroyed. This procedure can either be an open-heart surgery or a minimally invasive surgery. With the minimally invasive surgery, small cuts are made to access the heart instead of a large opening.  Pulmonary venous isolation. During this surgery, tissue around the veins that carry blood from the lungs (pulmonary veins) is destroyed. This tissue is thought to carry the abnormal signals. HOME CARE INSTRUCTIONS   Take medicines only as directed by your health care provider. Some medicines can make atrial fibrillation worse or recur.  If blood thinners were prescribed by your health care provider, take them exactly as directed. Too much blood-thinning medicine can cause bleeding. If you take too little, you will not have the needed protection against stroke and other problems.  Perform blood tests at home if directed by your health care provider. Perform blood tests exactly as directed.  Quit smoking if you smoke.  Do not drink alcohol.  Do not drink caffeinated beverages such as coffee, soda, and some teas. You may drink decaffeinated coffee, soda, or tea.   Maintain a healthy weight.Do not use diet pills unless your health care provider approves. They may make heart problems worse.   Follow diet instructions as directed by your health care provider.  Exercise regularly as directed by your health care provider.  Keep all follow-up visits as directed by your health care provider. This is important. PREVENTION  The following substances can cause atrial fibrillation to recur:   Caffeinated  beverages.  Alcohol.  Certain medicines, especially those used for breathing problems.  Certain herbs and herbal medicines, such as those containing ephedra or ginseng.  Illegal drugs, such as cocaine and amphetamines. Sometimes medicines are given to prevent atrial fibrillation from recurring. Proper treatment of any underlying condition is also important in helping prevent recurrence.  SEEK MEDICAL CARE IF:  You notice a change in the rate, rhythm, or strength of your heartbeat.  You suddenly begin urinating more frequently.  You tire more easily when exerting yourself or exercising. SEEK IMMEDIATE MEDICAL CARE IF:   You have chest pain, abdominal pain, sweating, or weakness.  You feel nauseous.  You have shortness of breath.  You suddenly have swollen feet and ankles.  You feel dizzy.  Your face or limbs feel numb or weak.  You have a change in your vision or speech. MAKE SURE YOU:   Understand these instructions.  Will watch your condition.  Will get help right away if you are not doing well or get worse. Document Released: 05/03/2005 Document Revised: 09/17/2013 Document Reviewed: 06/13/2012 [facility] Patient Information 2015 [place], [place]. This information is not intended to replace advice given to you by your health care  provider. Make sure you discuss any questions you have with your health care provider. Information on my medicine - ELIQUIS (apixaban)  This medication education was reviewed with me or my healthcare representative as part of my discharge preparation.  The pharmacist that spoke with me during my hospital stay was:  Fayne Norrie, [facility]  Why was Eliquis prescribed for you? Eliquis was prescribed for you to reduce the risk of a blood clot forming that can cause a stroke if you have a medical condition called atrial fibrillation (a type of irregular heartbeat).  What do You need to know about Eliquis ? Take your Eliquis TWICE DAILY -  one tablet in the morning and one tablet in the evening with or without food. If you have difficulty swallowing the tablet whole please discuss with your pharmacist how to take the medication safely.  Take Eliquis exactly as prescribed by your doctor and DO NOT stop taking Eliquis without talking to the doctor who prescribed the medication.  Stopping may increase your risk of developing a stroke.  Refill your prescription before you run out.  After discharge, you should have regular check-up appointments with your healthcare provider that is prescribing your Eliquis.  In the future your dose may need to be changed if your kidney function or weight changes by a significant amount or as you get older.  What do you do if you miss a dose? If you miss a dose, take it as soon as you remember on the same day and resume taking twice daily.  Do not take more than one dose of ELIQUIS at the same time to make up a missed dose.  Important Safety Information A possible side effect of Eliquis is bleeding. You should call your healthcare provider right away if you experience any of the following: ? Bleeding from an injury or your nose that does not stop. ? Unusual colored urine (red or dark brown) or unusual colored stools (red or black). ? Unusual bruising for unknown reasons. ? A serious fall or if you hit your head (even if there is no bleeding).  Some medicines may interact with Eliquis and might increase your risk of bleeding or clotting while on Eliquis. To help avoid this, consult your healthcare provider or pharmacist prior to using any new prescription or non-prescription medications, including herbals, vitamins, non-steroidal anti-inflammatory drugs (NSAIDs) and supplements.  This website has more information on Eliquis (apixaban): http://www.eliquis.com/eliquis/home

## 2014-08-04 NOTE — Progress Notes (Signed)
The patient's HR went into the 150s when she was ambulating to the [facility].  It went back down into the low 100s when she returned back to bed.  Her rhythm is still A. Fib.

## 2014-08-04 NOTE — Progress Notes (Addendum)
The patient arrived to 3E17 at 0130 from the ED.  She was oriented to the unit and placed on telemetry.  CCMD was notified.  She is A&Ox4 and her VSS at this time.  She does not have any complaints of pain, but does have dyspnea on exertion.  Crackles can be heard in her bilateral lung bases.  She is ambulating to the [facility]  The call bell was explained and placed within reach.

## 2014-08-04 NOTE — Care Management Note (Unsigned)
    Page 1 of 2   08/05/2014     4:12:34 PM CARE MANAGEMENT NOTE 08/05/2014  Patient:  Kaitlyn [name], Kaitlyn [name]   Account Number:  [id number]  Date Initiated:  08/04/2014  Documentation initiated by:  [facility]  Subjective/Objective Assessment:   adm: SOB     Action/Plan:   discharge planning   Anticipated DC Date:  08/05/2014   Anticipated DC Plan:  HOME W HOME HEALTH SERVICES      DC Planning Services  CM consult      Choice offered to / List presented to:          [facility] arranged  HH-1 RN  HH-10 DISEASE MANAGEMENT      HH agency  [facility]   Status of service:  In process, will continue to follow Medicare Important Message given?   (If response is "NO", the following Medicare IM given date fields will be blank) Date Medicare IM given:   Medicare IM given by:   Date Additional Medicare IM given:   Additional Medicare IM given by:    Discharge Disposition:    Per UR Regulation:    If discussed at Long Length of Stay Meetings, dates discussed:    Comments:  08/05/14 Nickie Retort, BSN [phone] Left message for financial counselor Milagros Loll to see if we can do [place] Medicaid Application while in house.  Will follow.  08/04/14 Pt is active with [facility] with [facility] and will need resumption orders at discharge.  Pt has been counseled with each admission the importance of following up with [facility] and pursuing insurance in this state (she curently has Medicaid in [place]). Pt states: SHE HAS NOT FOLLOW UP WITH THE [facility] ; SHE HAS NOT FOLLOWED THROUGH WITH SECURING INSURANCE IN THIS STATE; SHE REFUSES [facility] NURSE VISITS BC THEY ARE PUSHY (hri) AND is not amenable to another home health agency to follow; Pt states she does not wish to discuss it anymore today. Will follow for disposition. Freddy Jaksch, BSN, Cm [phone].
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
## 2014-08-04 NOTE — Progress Notes (Signed)
64 year old lady recently discharged from the hospital for acute on chronic systolic heart failure and afib, admitted for atrial fibrillation with RVR.  Her rate is better with IV metoprolol. Her heart rate between 90 to 110/min.  She denies any chest pain or sob.  She reports some upper respiratory symptoms. Plan to obtain an influenza pcr and ordered flonase and claritin.  Continue to monitor. If her HR does not improve plan to start her cardizem drip.   Kathlen Mody md [phone]

## 2014-08-05 ENCOUNTER — Inpatient Hospital Stay: Payer: Medicaid - Out of State

## 2014-08-05 LAB — INFLUENZA PANEL BY PCR (TYPE A & B)
H1N1 flu by pcr: DETECTED — AB
Influenza A By PCR: POSITIVE — AB
Influenza B By PCR: NEGATIVE

## 2014-08-05 MED ORDER — OSELTAMIVIR PHOSPHATE 75 MG PO CAPS
75.0000 mg | ORAL_CAPSULE | Freq: Two times a day (BID) | ORAL | Status: DC
  Administered 2014-08-05 – 2014-08-06 (×3): 75 mg via ORAL
  Filled 2014-08-05 (×4): qty 1

## 2014-08-05 NOTE — Progress Notes (Signed)
Pt. C/o nausea. PRN zofran ordered. RN will administer and continue to monitor pt. For changes in condition. Mahagony Grieb, Cheryll Dessert

## 2014-08-05 NOTE — Progress Notes (Signed)
[facility] PROGRESS NOTE  Kaitlyn [name] AVW:[id number] DOB: 1951-01-08 DOA: 08/03/2014 PCP: No PCP Per Patient  Assessment/Plan: 1. afib with RVR:  Rate better controlled with metoprolol and digoxin.  Currently on eliquis , resume the same meds and doses.    Chronic systolic heart failure and cardiomyopathy: She has pedal edema and she would need diuresis, patient absolutely refusing to be put on lasix and wants to follow up with heart failure team as outpatient.    Diabetes mellitus: CBG (last 3)  No results for input(s): GLUCAP in the last 72 hours.   Hypertension: Controlled.    Influenza positive: - started her on tamiflu .  Her symptoms are improving.    Mild thrombocytopenia: repeat in am.     Code Status: full code.  Family Communication: noen at bedside Disposition Plan: pending.    Consultants:  none  Procedures:  none  Antibiotics:  none  HPI/Subjective: Reports feeling a little better than yesterday.   Objective: Filed Vitals:   08/05/14 1842  BP: 118/77  Pulse: 97  Temp: 98.7 F (37.1 C)  Resp: 20    Intake/Output Summary (Last 24 hours) at 08/05/14 1846 Last data filed at 08/05/14 1829  Gross per 24 hour  Intake   1560 ml  Output   1325 ml  Net    235 ml   Filed Weights   08/04/14 0130 08/04/14 0500 08/05/14 0600  Weight: 128.232 kg (282 lb 11.2 oz) 128.23 kg (282 lb 11.1 oz) 125.283 kg (276 lb 3.2 oz)    Exam:   General:  Alert afebrile comfortable  Cardiovascular: s1s2  Respiratory: ctab  Abdomen: soft non tender non distended bowel sounds heard.   Musculoskeletal: 2+ edema.   Data Reviewed: Basic Metabolic Panel:  Recent Labs Lab 07/30/14 0506 07/31/14 0335 08/01/14 0418 08/03/14 2121 08/04/14 0557  NA 139 140 136 135 136  K 3.9 4.3 3.9 3.9 4.1  CL 107 109 104 101 103  CO2 GLUCOSE 46* 86 222* 182* 164*  BUN CREATININE 1.12* 1.16* 1.01 1.04 0.96  CALCIUM 8.6  8.8 9.2 8.7 8.6  MG  --  1.8  --   --   --   PHOS  --   --  4.5  --   --    Liver Function Tests:  Recent Labs Lab 07/31/14 0335 08/01/14 0418  AST 14  --   ALT 11  --   ALKPHOS 39  --   BILITOT 1.1  --   PROT 6.1  --   ALBUMIN 3.0* 3.4*   No results for input(s): LIPASE, AMYLASE in the last 168 hours. No results for input(s): AMMONIA in the last 168 hours. CBC:  Recent Labs Lab 07/30/14 0506 07/31/14 0335 08/01/14 0418 08/03/14 2121 08/04/14 0557  WBC 12.3* 11.0* 13.2* 7.6 7.1  NEUTROABS  --  6.1  --  5.0  --   HGB 12.3 12.2 12.6 12.2 11.8*  HCT 38.5 38.6 38.6 38.1 37.1  MCV 103.2* 103.8* 102.4* 101.9* 104.5*  PLT 191 193 219 172 144*   Cardiac Enzymes: No results for input(s): CKTOTAL, CKMB, CKMBINDEX, TROPONINI in the last 168 hours. BNP (last 3 results)  Recent Labs  06/12/14 0632 06/18/14 0616 07/03/14 1429  BNP 542.1* 904.1* 1055.2*    ProBNP (last 3 results)  Recent Labs  03/21/14 2126 03/22/14 1312 04/27/14 0128  PROBNP 2946.0* 3181.0* 5351.0*    CBG:  Recent  Labs Lab 07/31/14 1127 07/31/14 1621 07/31/14 2055 08/01/14 0735 08/01/14 1221  GLUCAP 130* 104* 138* 181* 152*    Recent Results (from the past 240 hour(s))  Stool culture     Status: None   Collection Time: 07/29/14  4:52 PM  Result Value Ref Range Status   Specimen Description STOOL  Final   Special Requests NONE  Final   Culture   Final    NO SALMONELLA, SHIGELLA, CAMPYLOBACTER, YERSINIA, OR E.COLI 0157:H7 ISOLATED Note: REDUCED NORMAL FLORA PRESENT Performed at [employer] [facility]    Report Status 08/02/2014 FINAL  Final  Clostridium Difficile by PCR     Status: None   Collection Time: 07/29/14  4:52 PM  Result Value Ref Range Status   C difficile by pcr NEGATIVE NEGATIVE Final  MRSA PCR Screening     Status: None   Collection Time: 07/29/14  6:30 PM  Result Value Ref Range Status   MRSA by PCR NEGATIVE NEGATIVE Final    Comment:        The GeneXpert MRSA  Assay (FDA approved for NASAL specimens only), is one component of a comprehensive MRSA colonization surveillance program. It is not intended to diagnose MRSA infection nor to guide or monitor treatment for MRSA infections.      Studies: Dg Chest Portable 1 View  08/03/2014   CLINICAL DATA:  Atrial fibrillation  EXAM: PORTABLE CHEST - 1 VIEW  COMPARISON:  07/29/2014  FINDINGS: Cardiomegaly with pulmonary vascular congestion. No frank interstitial edema. No pleural effusion or pneumothorax.  IMPRESSION: No evidence of acute cardiopulmonary disease.   Electronically Signed   By: Charline Bills M.D.   On: 08/03/2014 21:59    Scheduled Meds: . apixaban  5 mg Oral BID  . fluticasone  1 spray Each Nare Daily  . loratadine  10 mg Oral Daily  . metoprolol succinate  100 mg Oral BID  . oseltamivir  75 mg Oral BID  . sodium chloride  3 mL Intravenous Q12H  . sodium chloride  3 mL Intravenous Q12H   Continuous Infusions:   Principal Problem:   Atrial fibrillation with RVR Active Problems:   Diabetes mellitus type 2, uncontrolled   Cardiomyopathy-EF 15-20% by echo Jan 2016   Essential hypertension   Chronic systolic CHF (congestive heart failure)    Time spent: 25 minutes    Kaitlyn [name]  [facility] Pager [phone] If 7PM-7AM, please contact night-coverage at [email or web address], password [facility] 08/05/2014, 6:46 PM  LOS: 1 day

## 2014-08-06 DIAGNOSIS — Z9119 Patient's noncompliance with other medical treatment and regimen: Secondary | ICD-10-CM

## 2014-08-06 DIAGNOSIS — Z9114 Patient's other noncompliance with medication regimen: Secondary | ICD-10-CM

## 2014-08-06 DIAGNOSIS — F329 Major depressive disorder, single episode, unspecified: Secondary | ICD-10-CM

## 2014-08-06 LAB — CBC
HCT: 36.6 % (ref 36.0–46.0)
Hemoglobin: 11.4 g/dL — ABNORMAL LOW (ref 12.0–15.0)
MCH: 32.4 pg (ref 26.0–34.0)
MCHC: 31.1 g/dL (ref 30.0–36.0)
MCV: 104 fL — ABNORMAL HIGH (ref 78.0–100.0)
Platelets: 160 10*3/uL (ref 150–400)
RBC: 3.52 MIL/uL — ABNORMAL LOW (ref 3.87–5.11)
RDW: 15.7 % — ABNORMAL HIGH (ref 11.5–15.5)
WBC: 6.3 10*3/uL (ref 4.0–10.5)

## 2014-08-06 MED ORDER — APIXABAN 5 MG PO TABS: 5.0000 mg | ORAL_TABLET | Freq: Two times a day (BID) | ORAL | Status: DC

## 2014-08-06 MED ORDER — METOPROLOL TARTRATE 1 MG/ML IV SOLN
5.0000 mg | Freq: Once | INTRAVENOUS | Status: AC
  Administered 2014-08-06: 5 mg via INTRAVENOUS
  Filled 2014-08-06: qty 5

## 2014-08-06 MED ORDER — TORSEMIDE 20 MG PO TABS
20.0000 mg | ORAL_TABLET | Freq: Every evening | ORAL | Status: DC
  Filled 2014-08-06: qty 1

## 2014-08-06 MED ORDER — TORSEMIDE 20 MG PO TABS: 20.0000 mg | ORAL_TABLET | Freq: Every evening | ORAL | Status: DC

## 2014-08-06 MED ORDER — TORSEMIDE 20 MG PO TABS: 40.0000 mg | ORAL_TABLET | Freq: Every day | ORAL | Status: DC

## 2014-08-06 MED ORDER — METOPROLOL SUCCINATE ER 100 MG PO TB24: 100.0000 mg | ORAL_TABLET | Freq: Two times a day (BID) | ORAL | Status: DC

## 2014-08-06 MED ORDER — OSELTAMIVIR PHOSPHATE 75 MG PO CAPS: 75.0000 mg | ORAL_CAPSULE | Freq: Two times a day (BID) | ORAL | Status: DC

## 2014-08-06 MED ORDER — LORAZEPAM 2 MG/ML IJ SOLN
1.0000 mg | Freq: Once | INTRAMUSCULAR | Status: DC
  Filled 2014-08-06: qty 1

## 2014-08-06 MED ORDER — METOPROLOL TARTRATE 1 MG/ML IV SOLN
5.0000 mg | Freq: Once | INTRAVENOUS | Status: DC
  Filled 2014-08-06: qty 5

## 2014-08-06 MED ORDER — ZOLPIDEM TARTRATE 5 MG PO TABS
5.0000 mg | ORAL_TABLET | Freq: Once | ORAL | Status: AC
  Administered 2014-08-06: 5 mg via ORAL
  Filled 2014-08-06: qty 1

## 2014-08-06 MED ORDER — DIGOXIN 125 MCG PO TABS: 0.1250 mg | ORAL_TABLET | Freq: Every day | ORAL | Status: DC
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
## 2014-08-06 NOTE — Progress Notes (Signed)
Pt. HR elevated 140-180s sustaining. Text page sent to On call NP, K. Schorr.  Pt. Has PRN medication available. RN will administer and continue to monitor pt. For changes in condition. Kaitlyn [name], Cheryll Dessert

## 2014-08-06 NOTE — Evaluation (Signed)
Physical Therapy Evaluation Patient Details Name: Kaitlyn [name] MRN: [id number] DOB: 1950-12-18 Today's Date: 08/06/2014   History of Present Illness  adm with dyspnea and found to have afib with RVR  Clinical Impression  Patient evaluated by Physical Therapy with no further acute PT needs identified. Pt is at her baseline (independent with RW).  PT is signing off. Thank you for this referral.     Follow Up Recommendations No PT follow up    Equipment Recommendations  None recommended by PT    Recommendations for Other Services       Precautions / Restrictions Precautions Precautions: None      Mobility  Bed Mobility Overal bed mobility: Modified Independent                Transfers Overall transfer level: Modified independent Equipment used: Rolling walker (2 wheeled)                Ambulation/Gait Ambulation/Gait assistance: Modified independent (Device/Increase time) Ambulation Distance (Feet): 45 Feet Assistive device: Rolling walker (2 wheeled) Gait Pattern/deviations: Step-through pattern;Trunk flexed     General Gait Details: RW slightly too far ahead, however does keep it close during turns  Information systems manager [place] (Stroke Patients Only)       Balance Overall balance assessment: No apparent balance deficits (not formally assessed)                                           Pertinent Vitals/Pain Pain Assessment: No/denies pain    Home Living Family/patient expects to be discharged to:: Private residence Living Arrangements: Alone   Type of Home: Apartment Home Access: Level entry     Home Layout: One level Home Equipment: Walker - 2 wheels      Prior Function Level of Independence: Independent with assistive device(s);Needs assistance   Gait / Transfers Assistance Needed: ambulates with RW with modif I; states she uses tub and sink to help get up from toilet          Hand Dominance        Extremity/Trunk Assessment   Upper Extremity Assessment: Overall WFL for tasks assessed           Lower Extremity Assessment: Overall WFL for tasks assessed      Cervical / Trunk Assessment: Normal  Communication   Communication: No difficulties  Cognition Arousal/Alertness: Awake/alert Behavior During Therapy: WFL for tasks assessed/performed Overall Cognitive Status: Within Functional Limits for tasks assessed                      General Comments General comments (skin integrity, edema, etc.): discussed option of BSC over toilet and states she does not have room; educated re; toilet riser with handles and she will consider    Exercises        Assessment/Plan    PT Assessment Patent does not need any further PT services  PT Diagnosis Difficulty walking   PT Problem List    PT Treatment Interventions     PT Goals (Current goals can be found in the Care Plan section) Acute Rehab PT Goals PT Goal Formulation: All assessment and education complete, DC therapy    Frequency     Barriers to discharge        Co-evaluation  End of Session   Activity Tolerance: Patient tolerated treatment well Patient left: in bed;with call bell/phone within reach           Time: 0272-5366 PT Time Calculation (min) (ACUTE ONLY): 11 min   Charges:   PT Evaluation $Initial PT Evaluation Tier I: 1 Procedure     PT G Codes:        Wynne Jury 2014/08/17, 3:26 PM Pager [phone]
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
## 2014-08-06 NOTE — Progress Notes (Signed)
Pt. Refusing to have IV reinserted.

## 2014-08-06 NOTE — Progress Notes (Signed)
All d/c instructions explained and given to pt.  Verbalized understanding.  Instructed by Piedad Climes that ambulance has been called for pt but they are running late.  Pt made aware and oncoming nurse.  Amanda Pea, Charity fundraiser.

## 2014-08-06 NOTE — Progress Notes (Signed)
Kaitlyn [name] has been provided all discharge medications except Eliquis through the HF fund.  She has made a promise to come to an appointment in the [facility] on Thursday to get assistance with  Medicaid application and Eliquis assistance as she does not have a working phone currently.  I will send a cab to get her at 10 am on Thursday morning -- she is aware and agreeable.

## 2014-08-06 NOTE — Progress Notes (Signed)
Off going nurse reported pt had removed her tele box and iv.  Found iv on floor and pt's tele box at the sink.  Introduced self to pt and asked if I could pt her tele monitor back on as the MD had ordered it to monitor her heart make sure she is ok.  Pt  Verbalized "yes, and I want to know when the doctor is coming, because I want to go home."  Informed pt that the doctor will be here this am and will come talk to her about her concern.  Verbalized understanding.  Will continue to monitor.  Amanda Pea, Charity fundraiser.

## 2014-08-06 NOTE — Progress Notes (Signed)
Pt. HR remains elevated, 130s-160. Text page sent to on call NP, K. Schorr.

## 2014-08-06 NOTE — Discharge Summary (Addendum)
Physician Discharge Summary  Kaitlyn [name] ZOX:[id number] DOB: Feb 13, 1951 DOA: 08/03/2014  PCP: No PCP Per Patient  Admit date: 08/03/2014 Discharge date: 08/06/2014  Time spent: 30 minutes  Recommendations for Outpatient Follow-up:  1. Follow up with heart failure clinic as recommended.   Discharge Diagnoses:  Principal Problem:   Atrial fibrillation with RVR Active Problems:   Diabetes mellitus type 2, uncontrolled   Cardiomyopathy-EF 15-20% by echo Jan 2016   Essential hypertension   Chronic systolic CHF (congestive heart failure)   Medical non-compliance Influenza positive.   Discharge Condition: improved  Diet recommendation: low sodium diet.   Filed Weights   08/04/14 0130 08/04/14 0500 08/05/14 0600  Weight: 128.232 kg (282 lb 11.2 oz) 128.23 kg (282 lb 11.1 oz) 125.283 kg (276 lb 3.2 oz)    History of present illness:  64 year old lady admitted for a fib with RVR.   Hospital Course:  1. afib with RVR: Rate better controlled with metoprolol and digoxin.  Currently on eliquis , resume the same meds and doses.    Chronic systolic heart failure and cardiomyopathy: She has pedal edema and she would need diuresis, patient absolutely refusing to be put on lasix or demadex and wants to follow up with heart failure team as outpatient. After persuading her we started her torsemide  evening, 40 mg in am.    Diabetes mellitus: CBG (last 3)   Recent Labs (last 2 labs)     No results for input(s): GLUCAP in the last 72 hours.     Hypertension: Controlled.    Influenza positive: - started her on tamiflu .  Her symptoms are improving.    Mild thrombocytopenia: improved on am labs.   Agitation, confusion: Possibly from the [place] she got earlier today.  Psychiatry consutled to see if she has capacity to make medical decisions. She appears to have poor insight reg her medical problems. She has missed all her heart failure clnic appts int he past few months.  Appreciate psychiatry recommendations.         Procedures:  none  Consultations:  Heart failure  Physical therapy  Psychiatry.   Discharge Exam: Filed Vitals:   08/06/14 1047  BP: 112/72  Pulse: 99  Temp:   Resp:     General: alert afebrile comfortable Cardiovascular: s1s2 Respiratory: ctab  Discharge Instructions   Discharge Instructions    (HEART FAILURE PATIENTS) Call MD:  Anytime you have any of the following symptoms: 1) 3 pound weight gain in 24 hours or 5 pounds in 1 week 2) shortness of breath, with or without a dry hacking cough 3) swelling in the hands, feet or stomach 4) if you have to sleep on extra pillows at night in order to breathe.    Complete by:  As directed      (HEART FAILURE PATIENTS) Call MD:  Anytime you have any of the following symptoms: 1) 3 pound weight gain in 24 hours or 5 pounds in 1 week 2) shortness of breath, with or without a dry hacking cough 3) swelling in the hands, feet or stomach 4) if you have to sleep on extra pillows at night in order to breathe.    Complete by:  As directed      Call MD for:  persistant dizziness or light-headedness    Complete by:  As directed      Diet - low sodium heart healthy    Complete by:  As directed      Discharge  instructions    Complete by:  As directed   Follow up with HEART failure clinic appointment this week.          Current Discharge Medication List    START taking these medications   Details  oseltamivir (TAMIFLU) 75 MG capsule Take 1 capsule (75 mg total) by mouth 2 (two) times daily. Qty: 7 capsule, Refills: 0    !! torsemide (DEMADEX) 20 MG tablet Take 1 tablet (20 mg total) by mouth every evening. Qty: 30 tablet, Refills: 1    !! torsemide (DEMADEX) 20 MG tablet Take 2 tablets (40 mg total) by mouth daily. Qty: 30 tablet, Refills: 1     !! - Potential duplicate medications found. Please discuss with provider.    CONTINUE these medications which have CHANGED   Details   apixaban (ELIQUIS) 5 MG TABS tablet Take 1 tablet (5 mg total) by mouth 2 (two) times daily. Qty: 60 tablet, Refills: 0    digoxin (LANOXIN) 0.125 MG tablet Take 1 tablet (0.125 mg total) by mouth daily. Qty: 30 tablet, Refills: 0    metoprolol succinate (TOPROL-XL) 100 MG 24 hr tablet Take 1 tablet (100 mg total) by mouth 2 (two) times daily. Take with or immediately following a meal. Qty: 60 tablet, Refills: 0      CONTINUE these medications which have NOT CHANGED   Details  potassium chloride SA (K-DUR,KLOR-CON) 20 MEQ tablet Take 1 tablet (20 mEq total) by mouth 2 (two) times daily. Qty: 60 tablet, Refills: 0       Allergies  Allergen Reactions  . Caffeine Other (See Comments)    Seizure  . Penicillins Other (See Comments)    Unknown childhood allergy   Follow-up Information    Follow up with [facility]. Schedule an appointment as soon as possible for a visit on 08/12/2014.   Specialty:  Cardiology   Why:  @9 :40 am spoke with Lucio Edward information:   [address] [id number] Wilhe[place] Bon[place] [address] [phone]       The results of significant diagnostics from this hospitalization (including imaging, microbiology, ancillary and laboratory) are listed below for reference.    Significant Diagnostic Studies: Dg Chest Portable 1 View  08/03/2014   CLINICAL DATA:  Atrial fibrillation  EXAM: PORTABLE CHEST - 1 VIEW  COMPARISON:  07/29/2014  FINDINGS: Cardiomegaly with pulmonary vascular congestion. No frank interstitial edema. No pleural effusion or pneumothorax.  IMPRESSION: No evidence of acute cardiopulmonary disease.   Electronically Signed   By: Charline Bills M.D.   On: 08/03/2014 21:59   Dg Chest Port 1 View  07/29/2014   CLINICAL DATA:  Dyspnea.  Atrial fibrillation.  EXAM: PORTABLE CHEST - 1 VIEW  COMPARISON:  07/03/2014  FINDINGS: There is moderate cardiomegaly, unchanged. The lungs are  clear. There are no large effusions. Pulmonary vasculature appears normal.  IMPRESSION: Cardiomegaly.   Electronically Signed   By: Ellery Plunk M.D.   On: 07/29/2014 04:13    Microbiology: Recent Results (from the past 240 hour(s))  Stool culture     Status: None   Collection Time: 07/29/14  4:52 PM  Result Value Ref Range Status   Specimen Description STOOL  Final   Special Requests NONE  Final   Culture   Final    NO SALMONELLA, SHIGELLA, CAMPYLOBACTER, YERSINIA, OR E.COLI [id number] ISOLATED Note: REDUCED NORMAL FLORA PRESENT Performed at [facility]    Report Status 08/02/2014  FINAL  Final  Clostridium Difficile by PCR     Status: None   Collection Time: 07/29/14  4:52 PM  Result Value Ref Range Status   C difficile by pcr NEGATIVE NEGATIVE Final  MRSA PCR Screening     Status: None   Collection Time: 07/29/14  6:30 PM  Result Value Ref Range Status   MRSA by PCR NEGATIVE NEGATIVE Final    Comment:        The GeneXpert MRSA Assay (FDA approved for NASAL specimens only), is one component of a comprehensive MRSA colonization surveillance program. It is not intended to diagnose MRSA infection nor to guide or monitor treatment for MRSA infections.      Labs: Basic Metabolic Panel:  Recent Labs Lab 07/31/14 0335 08/01/14 0418 08/03/14 2121 08/04/14 0557  NA 140 136 135 136  K 4.3 3.9 3.9 4.1  CL 109 104 101 103  CO2 GLUCOSE 86 222* 182* 164*  BUN CREATININE 1.16* 1.01 1.04 0.96  CALCIUM 8.8 9.2 8.7 8.6  MG 1.8  --   --   --   PHOS  --  4.5  --   --    Liver Function Tests:  Recent Labs Lab 07/31/14 0335 08/01/14 0418  AST 14  --   ALT 11  --   ALKPHOS 39  --   BILITOT 1.1  --   PROT 6.1  --   ALBUMIN 3.0* 3.4*   No results for input(s): LIPASE, AMYLASE in the last 168 hours. No results for input(s): AMMONIA in the last 168 hours. CBC:  Recent Labs Lab 07/31/14 0335 08/01/14 0418 08/03/14 2121  08/04/14 0557 08/06/14 0310  WBC 11.0* 13.2* 7.6 7.1 6.3  NEUTROABS 6.1  --  5.0  --   --   HGB 12.2 12.6 12.2 11.8* 11.4*  HCT 38.6 38.6 38.1 37.1 36.6  MCV 103.8* 102.4* 101.9* 104.5* 104.0*  PLT 193 219 172 144* 160   Cardiac Enzymes: No results for input(s): CKTOTAL, CKMB, CKMBINDEX, TROPONINI in the last 168 hours. BNP: BNP (last 3 results)  Recent Labs  06/12/14 0632 06/18/14 0616 07/03/14 1429  BNP 542.1* 904.1* 1055.2*    ProBNP (last 3 results)  Recent Labs  03/21/14 2126 03/22/14 1312 04/27/14 0128  PROBNP 2946.0* 3181.0* 5351.0*    CBG:  Recent Labs Lab 07/31/14 1127 07/31/14 1621 07/31/14 2055 08/01/14 0735 08/01/14 1221  GLUCAP 130* 104* 138* 181* 152*       Signed:  Lawanda Holzheimer  [facility] 08/06/2014, 3:55 PM   AdDENDUM: Anew set of prescriptions were called in for her after discharge, and it included lisinopril 10 mg daily in addition to her diuretic, digoxin, metoprolol and eliquis. But the patient never picked it up.    Kathlen Mody, MD [phone]

## 2014-08-06 NOTE — Progress Notes (Signed)
Pt d/c off floor via ambulance  To home.  Amanda Pea, Charity fundraiser.

## 2014-08-06 NOTE — Progress Notes (Signed)
Pt. Verbally aggressive while RN at pts. Bedside. RN in the room to treat Pts. HR as ordered. Pt. Told RN to "get out of her room and leave her alone". Pt. Stated "you all don't know what you are doing, you are all psychos". Pt. Also attempted to pull her PIV out as RN was administering IV medication. RN requested that pt. Stay in the bed and ask for help when she needs to get up. Pt. Stated " you need to stop stalking me". Pt. Is up and down out of her bed continuously. RN encouraged the patient to call the staff when she needed to get up to assist her. Pt. Stated she didn't need help and to get out. Pt. In camera room. Fall risk assessment completed. RN will continue to monitor pt.

## 2014-08-06 NOTE — Progress Notes (Signed)
RN in pts. Room to administer IV Lopressor for HR and ativan.  RN noted pt. Had discontinued her PIV. Pts. HR now sustaining 90s-120s.  Pt. Resting/Asleep in bed. On call NP, K. Schorr made aware. RN will continue to monitor pt. For changes in condition. Carel Schnee, Cheryll Dessert

## 2014-08-08 ENCOUNTER — Telehealth (HOSPITAL_COMMUNITY): Payer: Self-pay | Admitting: Surgery

## 2014-08-08 NOTE — Telephone Encounter (Signed)
Ms. Manger called to say that she will be unable to keep her appt this am with Lasandra Beech- [facility] SW and myself.  She says that she has been having nausea and vomiting since being discharged as well as a "terrible cough".  She says that she was not given Tamiflu --however a prescription was sent to her pharmacy at the time of discharge.  She does say that she is taking all of her other HF medications that were provided at discharge.  She tells me that she will "try" to keep physician appt on Monday March 28th--yet is not sure at this time if she will be able to do so.  I encouraged her to seek medical care if her symptoms worsen.  I will call to cancel her transportation for this am.

## 2014-08-12 ENCOUNTER — Encounter (HOSPITAL_COMMUNITY): Payer: Medicaid - Out of State

## 2014-09-09 ENCOUNTER — Encounter (HOSPITAL_COMMUNITY): Payer: Self-pay | Admitting: Emergency Medicine

## 2014-09-09 DIAGNOSIS — I4891 Unspecified atrial fibrillation: Secondary | ICD-10-CM | POA: Diagnosis not present

## 2014-09-09 DIAGNOSIS — I509 Heart failure, unspecified: Secondary | ICD-10-CM | POA: Diagnosis not present

## 2014-09-09 DIAGNOSIS — I10 Essential (primary) hypertension: Secondary | ICD-10-CM | POA: Diagnosis not present

## 2014-09-09 DIAGNOSIS — Z8739 Personal history of other diseases of the musculoskeletal system and connective tissue: Secondary | ICD-10-CM | POA: Diagnosis not present

## 2014-09-09 DIAGNOSIS — E119 Type 2 diabetes mellitus without complications: Secondary | ICD-10-CM | POA: Diagnosis not present

## 2014-09-09 DIAGNOSIS — Z880 Allergy status to penicillin: Secondary | ICD-10-CM | POA: Insufficient documentation

## 2014-09-09 DIAGNOSIS — Z79899 Other long term (current) drug therapy: Secondary | ICD-10-CM | POA: Diagnosis not present

## 2014-09-09 DIAGNOSIS — K5900 Constipation, unspecified: Secondary | ICD-10-CM | POA: Insufficient documentation

## 2014-09-09 NOTE — ED Notes (Signed)
Pt reports constipation since last Thursday; pt reports increased rectal pain when attempting to have BM; pt reports "feels like there is a ball of feces in there that wont come out"; pt reports diarrhea prior to thurday

## 2014-09-10 ENCOUNTER — Emergency Department (HOSPITAL_COMMUNITY)
Admission: EM | Admit: 2014-09-10 | Discharge: 2014-09-10 | Disposition: A | Discharge status: Home / Self Care | Payer: Medicaid - Out of State | Attending: Emergency Medicine | Admitting: Emergency Medicine

## 2014-09-10 DIAGNOSIS — K5900 Constipation, unspecified: Secondary | ICD-10-CM

## 2014-09-10 MED ORDER — POLYETHYLENE GLYCOL 3350 17 G PO PACK
17.0000 g | PACK | Freq: Every day | ORAL | Status: DC
  Filled 2014-09-10 (×3): qty 1

## 2014-09-10 MED ORDER — POLYETHYLENE GLYCOL 3350 17 GM/SCOOP PO POWD: 17.0000 g | Freq: Every day | ORAL | Status: DC

## 2014-09-10 NOTE — ED Notes (Signed)
PTAR at bedside for transport.  

## 2014-09-10 NOTE — ED Notes (Signed)
Medium amount of stool noted in bedside commode after enema; pt reports improvement

## 2014-09-10 NOTE — Discharge Instructions (Signed)
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
## 2014-09-11 ENCOUNTER — Emergency Department (HOSPITAL_COMMUNITY)
Admission: EM | Admit: 2014-09-11 | Discharge: 2014-09-12 | Disposition: A | Discharge status: Home / Self Care | Payer: Medicaid - Out of State | Attending: Emergency Medicine | Admitting: Emergency Medicine

## 2014-09-11 ENCOUNTER — Encounter (HOSPITAL_COMMUNITY): Payer: Self-pay | Admitting: *Deleted

## 2014-09-11 DIAGNOSIS — I509 Heart failure, unspecified: Secondary | ICD-10-CM | POA: Insufficient documentation

## 2014-09-11 DIAGNOSIS — Z9119 Patient's noncompliance with other medical treatment and regimen: Secondary | ICD-10-CM | POA: Diagnosis not present

## 2014-09-11 DIAGNOSIS — Z91199 Patient's noncompliance with other medical treatment and regimen due to unspecified reason: Secondary | ICD-10-CM

## 2014-09-11 DIAGNOSIS — Z880 Allergy status to penicillin: Secondary | ICD-10-CM | POA: Insufficient documentation

## 2014-09-11 DIAGNOSIS — E119 Type 2 diabetes mellitus without complications: Secondary | ICD-10-CM | POA: Insufficient documentation

## 2014-09-11 DIAGNOSIS — I10 Essential (primary) hypertension: Secondary | ICD-10-CM | POA: Diagnosis not present

## 2014-09-11 DIAGNOSIS — K5900 Constipation, unspecified: Secondary | ICD-10-CM | POA: Diagnosis present

## 2014-09-11 DIAGNOSIS — I4891 Unspecified atrial fibrillation: Secondary | ICD-10-CM | POA: Insufficient documentation

## 2014-09-11 DIAGNOSIS — Z79899 Other long term (current) drug therapy: Secondary | ICD-10-CM | POA: Diagnosis not present

## 2014-09-11 DIAGNOSIS — Z8739 Personal history of other diseases of the musculoskeletal system and connective tissue: Secondary | ICD-10-CM | POA: Diagnosis not present

## 2014-09-11 NOTE — ED Notes (Signed)
Bed: ZS01 Expected date:  Expected time:  Means of arrival:  Comments: EMS 40F constipation, seen yesterday for same

## 2014-09-11 NOTE — ED Notes (Signed)
Per eMS pt from home, pt has been constipated x 1 week, has NOT taken any medication OTC, went to [facility] yesterday and stated they did nothing for her so she wanted to come here, was given prescription from [facility] but did not get it filled (Miralax), pt stated the hospital should fill her prescription for her.

## 2014-09-12 MED ORDER — POLYETHYLENE GLYCOL 3350 17 G PO PACK: 17.0000 g | PACK | Freq: Three times a day (TID) | ORAL | Status: DC

## 2014-09-12 MED ORDER — MAGNESIUM CITRATE PO SOLN
1.0000 | Freq: Once | ORAL | Status: AC
  Administered 2014-09-12: 1 via ORAL
  Filled 2014-09-12: qty 296

## 2014-09-12 MED ORDER — DOCUSATE SODIUM 100 MG PO CAPS: 100.0000 mg | ORAL_CAPSULE | Freq: Two times a day (BID) | ORAL | Status: DC

## 2014-09-12 MED ORDER — FLEET ENEMA 7-19 GM/118ML RE ENEM
1.0000 | ENEMA | Freq: Once | RECTAL | Status: AC
  Administered 2014-09-12: 1 via RECTAL
  Filled 2014-09-12: qty 1

## 2014-09-12 NOTE — Discharge Instructions (Signed)
RECOMMEND USE OF ALL MEDICATIONS AS PRESCRIBED AND GIVEN TO YOU IN EMERGENCY DEPARTMENT. ALSO RECOMMEND REGULAR USE OF PRUNE JUICE, A HIGH FIBER DIET AND/OR FIBER SUPPLEMENTATION (IE BENAFIBER OR METAMUCIL), PLENTY OF WATER, INCREASED ACTIVITY. FOLLOW UP WITH YOUR DOCTOR FOR RECHECK THIS WEEK.  Constipation Constipation is when a person has fewer than three bowel movements a week, has difficulty having a bowel movement, or has stools that are dry, hard, or larger than normal. As people grow older, constipation is more common. If you try to fix constipation with medicines that make you have a bowel movement (laxatives), the problem may get worse. Long-term laxative use may cause the muscles of the colon to become weak. A low-fiber diet, not taking in enough fluids, and taking certain medicines may make constipation worse.  CAUSES   Certain medicines, such as antidepressants, pain medicine, iron supplements, antacids, and water pills.   Certain diseases, such as diabetes, irritable bowel syndrome (IBS), thyroid disease, or depression.   Not drinking enough water.   Not eating enough fiber-rich foods.   Stress or travel.   Lack of physical activity or exercise.   Ignoring the urge to have a bowel movement.   Using laxatives too much.  SIGNS AND SYMPTOMS   Having fewer than three bowel movements a week.   Straining to have a bowel movement.   Having stools that are hard, dry, or larger than normal.   Feeling full or bloated.   Pain in the lower abdomen.   Not feeling relief after having a bowel movement.  DIAGNOSIS  Your health care provider will take a medical history and perform a physical exam. Further testing may be done for severe constipation. Some tests may include:  A barium enema X-ray to examine your rectum, colon, and, sometimes, your small intestine.   A sigmoidoscopy to examine your lower colon.   A colonoscopy to examine your entire colon. TREATMENT    Treatment will depend on the severity of your constipation and what is causing it. Some dietary treatments include drinking more fluids and eating more fiber-rich foods. Lifestyle treatments may include regular exercise. If these diet and lifestyle recommendations do not help, your health care provider may recommend taking over-the-counter laxative medicines to help you have bowel movements. Prescription medicines may be prescribed if over-the-counter medicines do not work.  HOME CARE INSTRUCTIONS   Eat foods that have a lot of fiber, such as fruits, vegetables, whole grains, and beans.  Limit foods high in fat and processed sugars, such as french fries, hamburgers, cookies, candies, and soda.   A fiber supplement may be added to your diet if you cannot get enough fiber from foods.   Drink enough fluids to keep your urine clear or pale yellow.   Exercise regularly or as directed by your health care provider.   Go to the restroom when you have the urge to go. Do not hold it.   Only take over-the-counter or prescription medicines as directed by your health care provider. Do not take other medicines for constipation without talking to your health care provider first.  SEEK IMMEDIATE MEDICAL CARE IF:   You have bright red blood in your stool.   Your constipation lasts for more than 4 days or gets worse.   You have abdominal or rectal pain.   You have thin, pencil-like stools.   You have unexplained weight loss. MAKE SURE YOU:   Understand these instructions.  Will watch your condition.  Will get help  right away if you are not doing well or get worse. Document Released: 01/30/2004 Document Revised: 05/08/2013 Document Reviewed: 02/12/2013 [facility] Patient Information 2015 [place], [place]. This information is not intended to replace advice given to you by your health care provider. Make sure you discuss any questions you have with your health care provider. High-Fiber  Diet Fiber is found in fruits, vegetables, and grains. A high-fiber diet encourages the addition of more whole grains, legumes, fruits, and vegetables in your diet. The recommended amount of fiber for adult males is 38 g per day. For adult females, it is 25 g per day. Pregnant and lactating women should get 28 g of fiber per day. If you have a digestive or bowel problem, ask your caregiver for advice before adding high-fiber foods to your diet. Eat a variety of high-fiber foods instead of only a select few type of foods.  PURPOSE To increase stool bulk. To make bowel movements more regular to prevent constipation. To lower cholesterol. To prevent overeating. WHEN IS THIS DIET USED? It may be used if you have constipation and hemorrhoids. It may be used if you have uncomplicated diverticulosis (intestine condition) and irritable bowel syndrome. It may be used if you need help with weight management. It may be used if you want to add it to your diet as a protective measure against atherosclerosis, diabetes, and cancer. SOURCES OF FIBER Whole-grain breads and cereals. Fruits, such as apples, oranges, bananas, berries, prunes, and pears. Vegetables, such as green peas, carrots, sweet potatoes, beets, broccoli, cabbage, spinach, and artichokes. Legumes, such split peas, soy, lentils. Almonds. FIBER CONTENT IN FOODS Starches and Grains / Dietary Fiber (g) Cheerios, 1 cup / 3 g Corn Flakes cereal, 1 cup / 0.7 g Rice crispy treat cereal, 1 cup / 0.3 g Instant oatmeal (cooked),  cup / 2 g Frosted wheat cereal, 1 cup / 5.1 g Brown, long-grain rice (cooked), 1 cup / 3.5 g White, long-grain rice (cooked), 1 cup / 0.6 g Enriched macaroni (cooked), 1 cup / 2.5 g Legumes / Dietary Fiber (g) Baked beans (canned, plain, or vegetarian),  cup / 5.2 g Kidney beans (canned),  cup / 6.8 g Pinto beans (cooked),  cup / 5.5 g Breads and Crackers / Dietary Fiber (g) Plain or honey graham crackers, 2  squares / 0.7 g Saltine crackers, 3 squares / 0.3 g Plain, salted pretzels, 10 pieces / 1.8 g Whole-wheat bread, 1 slice / 1.9 g White bread, 1 slice / 0.7 g Raisin bread, 1 slice / 1.2 g Plain bagel, 3 oz / 2 g Flour tortilla, 1 oz / 0.9 g Corn tortilla, 1 small / 1.5 g Hamburger or hotdog bun, 1 small / 0.9 g Fruits / Dietary Fiber (g) Apple with skin, 1 medium / 4.4 g Sweetened applesauce,  cup / 1.5 g Banana,  medium / 1.5 g Grapes, 10 grapes / 0.4 g Orange, 1 small / 2.3 g Raisin, 1.5 oz / 1.6 g Melon, 1 cup / 1.4 g Vegetables / Dietary Fiber (g) Green beans (canned),  cup / 1.3 g Carrots (cooked),  cup / 2.3 g Broccoli (cooked),  cup / 2.8 g Peas (cooked),  cup / 4.4 g Mashed potatoes,  cup / 1.6 g Lettuce, 1 cup / 0.5 g Corn (canned),  cup / 1.6 g Tomato,  cup / 1.1 g Document Released: 05/03/2005 Document Revised: 11/02/2011 Document Reviewed: 08/05/2011 [employer] Patient Information 2015 [place], [place]. This information is not intended to replace advice given  to you by your health care provider. Make sure you discuss any questions you have with your health care provider.

## 2014-09-12 NOTE — ED Notes (Signed)
[facility] called to transport pt back home 
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
## 2014-09-15 ENCOUNTER — Encounter (HOSPITAL_COMMUNITY): Payer: Self-pay | Admitting: Emergency Medicine

## 2014-09-15 ENCOUNTER — Emergency Department (HOSPITAL_COMMUNITY)
Admission: EM | Admit: 2014-09-15 | Discharge: 2014-09-15 | Disposition: A | Discharge status: Home / Self Care | Payer: Medicaid - Out of State | Attending: Emergency Medicine | Admitting: Emergency Medicine

## 2014-09-15 ENCOUNTER — Emergency Department (HOSPITAL_COMMUNITY): Payer: Medicaid - Out of State

## 2014-09-15 DIAGNOSIS — I509 Heart failure, unspecified: Secondary | ICD-10-CM | POA: Insufficient documentation

## 2014-09-15 DIAGNOSIS — I4891 Unspecified atrial fibrillation: Secondary | ICD-10-CM

## 2014-09-15 DIAGNOSIS — Z880 Allergy status to penicillin: Secondary | ICD-10-CM | POA: Insufficient documentation

## 2014-09-15 DIAGNOSIS — E119 Type 2 diabetes mellitus without complications: Secondary | ICD-10-CM | POA: Insufficient documentation

## 2014-09-15 DIAGNOSIS — Z79899 Other long term (current) drug therapy: Secondary | ICD-10-CM | POA: Insufficient documentation

## 2014-09-15 DIAGNOSIS — Z8739 Personal history of other diseases of the musculoskeletal system and connective tissue: Secondary | ICD-10-CM | POA: Insufficient documentation

## 2014-09-15 DIAGNOSIS — I10 Essential (primary) hypertension: Secondary | ICD-10-CM | POA: Insufficient documentation

## 2014-09-15 LAB — I-STAT CHEM 8, ED
BUN: 19 mg/dL (ref 6–20)
Calcium, Ion: 1.14 mmol/L (ref 1.13–1.30)
Chloride: 101 mmol/L (ref 101–111)
Creatinine, Ser: 0.9 mg/dL (ref 0.44–1.00)
Glucose, Bld: 301 mg/dL — ABNORMAL HIGH (ref 70–99)
HCT: 36 % (ref 36.0–46.0)
Hemoglobin: 12.2 g/dL (ref 12.0–15.0)
Potassium: 3.6 mmol/L (ref 3.5–5.1)
Sodium: 140 mmol/L (ref 135–145)
TCO2: 21 mmol/L (ref 0–100)

## 2014-09-15 LAB — I-STAT TROPONIN, ED: Troponin i, poc: 0.03 ng/mL (ref 0.00–0.08)

## 2014-09-15 MED ORDER — DILTIAZEM HCL 100 MG IV SOLR
5.0000 mg/h | INTRAVENOUS | Status: DC
  Administered 2014-09-15: 5 mg/h via INTRAVENOUS

## 2014-09-15 MED ORDER — TORSEMIDE 20 MG PO TABS: 40.0000 mg | ORAL_TABLET | Freq: Every day | ORAL | Status: DC

## 2014-09-15 MED ORDER — DILTIAZEM LOAD VIA INFUSION
15.0000 mg | Freq: Once | INTRAVENOUS | Status: AC
  Administered 2014-09-15: 15 mg via INTRAVENOUS
  Filled 2014-09-15: qty 15

## 2014-09-15 MED ORDER — APIXABAN 5 MG PO TABS: 5.0000 mg | ORAL_TABLET | Freq: Two times a day (BID) | ORAL | Status: DC

## 2014-09-15 MED ORDER — DIGOXIN 125 MCG PO TABS
0.1250 mg | ORAL_TABLET | Freq: Once | ORAL | Status: AC
  Administered 2014-09-15: 0.125 mg via ORAL
  Filled 2014-09-15: qty 1

## 2014-09-15 MED ORDER — DIGOXIN 125 MCG PO TABS: 0.1250 mg | ORAL_TABLET | Freq: Every day | ORAL | Status: DC

## 2014-09-15 MED ORDER — METOPROLOL SUCCINATE ER 100 MG PO TB24: 100.0000 mg | ORAL_TABLET | Freq: Two times a day (BID) | ORAL | Status: DC

## 2014-09-15 NOTE — ED Notes (Signed)
EMS called for SOB starting today. Has not had digoxin in 3 days due to running out of it; has not taken metoprolol x 1 day and less than usual due to trying to conserve the amount she has left. EMS report Afib on 12-lead from 90's to 120's. 90% on room air. Alert & oriented x4.

## 2014-09-15 NOTE — ED Notes (Signed)
Patient reports SOB feels better with oxygen on; 92% on room air; 94% on [place] 2L.

## 2014-09-15 NOTE — ED Notes (Signed)
[facility] requested for transport. Patient states that is how she always gets home.

## 2014-09-15 NOTE — ED Notes (Signed)
Spoke with Burna Mortimer in Case Management; she will review and call back.

## 2014-09-15 NOTE — ED Notes (Signed)
Patient well known to Od[place], Cm. She spoke with patient at length via telephone.

## 2014-09-15 NOTE — Progress Notes (Signed)
ED CM received call from [facility] on [address] concerning medication assistance.  Reviewed patient's record. Patient know to CM, patient has been assisted several times with medications, spoke with patient and  given resources to follow -up with clinic for [facility] and applying for [facility] Medicaid. Spoke with patient by phone concerning follow-up resources. Patient states, "I don't have transportation, so I could not get there". Explained to patient that I will follow up tomorrow with the [facility] to arrange to reschedule follow up appointment, patient agreeable. Updated Dr. Radford Pax and Belenda Cruise RN on [address] on disposition plan. CM will follow up with patient.

## 2014-09-15 NOTE — ED Notes (Signed)
[facility] arrived for transport home 

## 2014-09-15 NOTE — Discharge Instructions (Signed)
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
## 2014-09-16 ENCOUNTER — Emergency Department (HOSPITAL_COMMUNITY): Payer: Medicaid - Out of State

## 2014-09-16 ENCOUNTER — Encounter (HOSPITAL_COMMUNITY): Payer: Self-pay | Admitting: Emergency Medicine

## 2014-09-16 ENCOUNTER — Emergency Department (HOSPITAL_COMMUNITY)
Admission: EM | Admit: 2014-09-16 | Discharge: 2014-09-16 | Disposition: A | Discharge status: Home / Self Care | Payer: Self-pay | Attending: Emergency Medicine | Admitting: Emergency Medicine

## 2014-09-16 DIAGNOSIS — E1165 Type 2 diabetes mellitus with hyperglycemia: Secondary | ICD-10-CM

## 2014-09-16 DIAGNOSIS — Z8739 Personal history of other diseases of the musculoskeletal system and connective tissue: Secondary | ICD-10-CM | POA: Insufficient documentation

## 2014-09-16 DIAGNOSIS — E119 Type 2 diabetes mellitus without complications: Secondary | ICD-10-CM | POA: Insufficient documentation

## 2014-09-16 DIAGNOSIS — Z9119 Patient's noncompliance with other medical treatment and regimen: Secondary | ICD-10-CM | POA: Insufficient documentation

## 2014-09-16 DIAGNOSIS — R0602 Shortness of breath: Secondary | ICD-10-CM

## 2014-09-16 DIAGNOSIS — Z79899 Other long term (current) drug therapy: Secondary | ICD-10-CM | POA: Insufficient documentation

## 2014-09-16 DIAGNOSIS — Z880 Allergy status to penicillin: Secondary | ICD-10-CM | POA: Insufficient documentation

## 2014-09-16 DIAGNOSIS — Z7902 Long term (current) use of antithrombotics/antiplatelets: Secondary | ICD-10-CM | POA: Insufficient documentation

## 2014-09-16 DIAGNOSIS — Z91199 Patient's noncompliance with other medical treatment and regimen due to unspecified reason: Secondary | ICD-10-CM

## 2014-09-16 DIAGNOSIS — I429 Cardiomyopathy, unspecified: Secondary | ICD-10-CM

## 2014-09-16 DIAGNOSIS — I482 Chronic atrial fibrillation, unspecified: Secondary | ICD-10-CM | POA: Diagnosis present

## 2014-09-16 DIAGNOSIS — Z872 Personal history of diseases of the skin and subcutaneous tissue: Secondary | ICD-10-CM | POA: Insufficient documentation

## 2014-09-16 DIAGNOSIS — I10 Essential (primary) hypertension: Secondary | ICD-10-CM | POA: Insufficient documentation

## 2014-09-16 DIAGNOSIS — I5023 Acute on chronic systolic (congestive) heart failure: Secondary | ICD-10-CM | POA: Insufficient documentation

## 2014-09-16 HISTORY — DX: Patient's noncompliance with other medical treatment and regimen due to unspecified reason: Z91.199

## 2014-09-16 HISTORY — DX: Other persistent atrial fibrillation: I48.19

## 2014-09-16 HISTORY — DX: Chronic systolic (congestive) heart failure: I50.22

## 2014-09-16 HISTORY — DX: Nonrheumatic aortic (valve) insufficiency: I35.1

## 2014-09-16 HISTORY — DX: Patient's noncompliance with other medical treatment and regimen: Z91.19

## 2014-09-16 HISTORY — DX: Rosacea, unspecified: L71.9

## 2014-09-16 LAB — BASIC METABOLIC PANEL
Anion gap: 12 (ref 5–15)
BUN: 17 mg/dL (ref 6–20)
CO2: 22 mmol/L (ref 22–32)
Calcium: 8.5 mg/dL — ABNORMAL LOW (ref 8.9–10.3)
Chloride: 103 mmol/L (ref 101–111)
Creatinine, Ser: 1.16 mg/dL — ABNORMAL HIGH (ref 0.44–1.00)
GFR calc Af Amer: 56 mL/min — ABNORMAL LOW (ref >60)
GFR calc non Af Amer: 49 mL/min — ABNORMAL LOW (ref >60)
Glucose, Bld: 309 mg/dL — ABNORMAL HIGH (ref 70–99)
Potassium: 4.1 mmol/L (ref 3.5–5.1)
Sodium: 137 mmol/L (ref 135–145)

## 2014-09-16 LAB — BRAIN NATRIURETIC PEPTIDE: B Natriuretic Peptide: 900.4 pg/mL — ABNORMAL HIGH (ref 0.0–100.0)

## 2014-09-16 LAB — URINALYSIS, ROUTINE W REFLEX MICROSCOPIC
Bilirubin Urine: NEGATIVE
Glucose, UA: NEGATIVE mg/dL
Ketones, ur: NEGATIVE mg/dL
Nitrite: NEGATIVE
Protein, ur: NEGATIVE mg/dL
Specific Gravity, Urine: 1.007 (ref 1.005–1.030)
Urobilinogen, UA: 0.2 mg/dL (ref 0.0–1.0)
pH: 6 (ref 5.0–8.0)

## 2014-09-16 LAB — CBC
HCT: 37.5 % (ref 36.0–46.0)
Hemoglobin: 12.1 g/dL (ref 12.0–15.0)
MCH: 32.4 pg (ref 26.0–34.0)
MCHC: 32.3 g/dL (ref 30.0–36.0)
MCV: 100.5 fL — ABNORMAL HIGH (ref 78.0–100.0)
Platelets: 238 10*3/uL (ref 150–400)
RBC: 3.73 MIL/uL — ABNORMAL LOW (ref 3.87–5.11)
RDW: 16.1 % — ABNORMAL HIGH (ref 11.5–15.5)
WBC: 12.7 10*3/uL — ABNORMAL HIGH (ref 4.0–10.5)

## 2014-09-16 LAB — I-STAT TROPONIN, ED: Troponin i, poc: 0.01 ng/mL (ref 0.00–0.08)

## 2014-09-16 LAB — URINE MICROSCOPIC-ADD ON

## 2014-09-16 LAB — MAGNESIUM: Magnesium: 1.8 mg/dL (ref 1.7–2.4)

## 2014-09-16 MED ORDER — TORSEMIDE 20 MG PO TABS: 40.0000 mg | ORAL_TABLET | Freq: Every day | ORAL | Status: DC

## 2014-09-16 MED ORDER — DEXTROSE 5 % IV SOLN
5.0000 mg/h | INTRAVENOUS | Status: DC
  Administered 2014-09-16: 5 mg/h via INTRAVENOUS

## 2014-09-16 MED ORDER — METOPROLOL SUCCINATE ER 100 MG PO TB24: 100.0000 mg | ORAL_TABLET | Freq: Two times a day (BID) | ORAL | Status: DC

## 2014-09-16 MED ORDER — FUROSEMIDE 10 MG/ML IJ SOLN
40.0000 mg | Freq: Once | INTRAMUSCULAR | Status: DC
  Filled 2014-09-16: qty 4

## 2014-09-16 MED ORDER — METOPROLOL SUCCINATE ER 25 MG PO TB24
100.0000 mg | ORAL_TABLET | Freq: Once | ORAL | Status: AC
  Administered 2014-09-16: 100 mg via ORAL
  Filled 2014-09-16: qty 4

## 2014-09-16 MED ORDER — DILTIAZEM LOAD VIA INFUSION
15.0000 mg | Freq: Once | INTRAVENOUS | Status: AC
  Administered 2014-09-16: 15 mg via INTRAVENOUS
  Filled 2014-09-16: qty 15

## 2014-09-16 MED ORDER — APIXABAN 5 MG PO TABS: 5.0000 mg | ORAL_TABLET | Freq: Two times a day (BID) | ORAL | Status: DC

## 2014-09-16 MED ORDER — DIGOXIN 125 MCG PO TABS
0.1250 mg | ORAL_TABLET | Freq: Once | ORAL | Status: AC
  Administered 2014-09-16: 0.125 mg via ORAL
  Filled 2014-09-16: qty 1

## 2014-09-16 MED ORDER — DIGOXIN 125 MCG PO TABS: 0.1250 mg | ORAL_TABLET | Freq: Every day | ORAL | Status: DC

## 2014-09-16 NOTE — ED Notes (Signed)
Per EMS - pt seen here yesterday for a fib rapid rate, sent home, unable to fill prescriptions lately takes digoxin and metoprolol. Pt woke up this morning felt sob and her heart racing. SpO2 on RA 90%. Pt alert and oriented x4.
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
## 2014-09-16 NOTE — ED Notes (Signed)
Cardiology at bedside.

## 2014-09-16 NOTE — ED Notes (Signed)
Dr. Rancour at bedside. 

## 2014-09-16 NOTE — Care Management Note (Signed)
Case Management Note  Patient Details  Name: Kaitlyn [name] MRN: [id number] Date of Birth: October 26, 1950  Subjective/Objective:      Patient presented to [facility] ED with complaints of SOB/ CP, medication refill history of chronic systolic CHF, habitual noncompliance with medications/follow-up, HTN, DM, persistent/chronic atrial fibrillation,             Action/Plan:  Follow up appointment at the [facility],  Medication assistance Transportation    Expected Discharge Date:  09/16/14               Expected Discharge Plan:  Home/Self Care  In-House Referral:     Discharge planning Services  Follow-up appt scheduled, Medication Assistance [facility] 09/23/14 at 11am . Patient will recieve medications 5/3 from the [facility] pharmacy )  Post Acute Care Choice:    Choice offered to:     DME Arranged:    DME Agency:     HH Arranged:    [place] Agency:     Status of Service:   Patient will receive Medications from the [facility] pharmacy on  5/6   Medicare Important Message Given:    Date Medicare IM Given:    Medicare IM give by:    Date Additional Medicare IM Given:    Additional Medicare Important Message give by:     If discussed at [place] of Stay Meetings, dates discussed:    Additional Comments:       Patient is know to CM, patient has not been compliant with follow up, and medications.  Met with patient at bedside  Stressed the importance of follow-up care, and medication management. Several follow up appointments with the [facility] has been schedule patient did not show. Patient states, she has run out of medication,  Patient stated, that she does not have transportation to get to her appointments, and does not know anyone in [place]. Explained that patient needs to establish care with a PCP, to manage her Primary care needs closely, patient verbalized understanding and is agreeable with plan. [facility] contacted regarding rescheduling her appointment Appt scheduled 5/9 at 11am at patient's request, [facility] has  arranged to send transportation, and assist with her medications this time,  patient verbalized appreciation. Prescriptions were written and faxed to the [facility] pharmacy to be ready tomorrow 5/3. Stressed the importance of keeping this follow up appointment, she not be allowed to reschedule, she verbalized understanding, and agreeable with discharge plan.  Information given to patient and placed on the AVS. Patient was also encouraged to apply for Medicaid Assistance. Updated T.  Kirichenko PA-C and Holley RN on [employer], who are agreeable with disposition plan. No further ED CM needs identified.   Kaitlyn Slimmer, RN 09/16/2014, 7:08 PM

## 2014-09-16 NOTE — ED Notes (Signed)
Pt hr down to 85 with cardizem at 10. Pt no longer on O2, O2 sats at 95% on RA

## 2014-09-16 NOTE — ED Provider Notes (Signed)
Signed out to me at shift change. Patient history CHF, medication noncompliance. She was seen by cardiologist to think that she is okay to be discharged home as long as she keeps taking her medications which she is not taking at present. Patient has some social issues, she is unable to afford her medications. I spoke with Mrs. case manager who went up and beyond what I think anyone would do and tried to figure out a way for her to have medications. We will give her a dose of medications to take tonight. The case manager will pick up her mediations tomorrow and deliver to her. She was also scheduled a follow up apt for Monday at 11. She was instructed to follow up ito get her meds.   Patient's vital signs are normal at this time. She is maintaining oxygen saturation above 95 on room air. She is has normal heart rate and blood pressure. She is stable for discharge home.  Filed Vitals:   09/16/14 1600 09/16/14 1645 09/16/14 1700 09/16/14 1823  BP: 143/69 124/74 143/98 138/82  Pulse: 80  92 89  Temp:      TempSrc:      Resp: 19  26   SpO2: 95% 95% 94%      Jaynie Crumble, PA-C 09/16/14 1845  Jerelyn Scott, MD 09/16/14 1858

## 2014-09-16 NOTE — ED Notes (Signed)
With exertion hr from 95 to 130, pt reports taking "water pills" that make her urinate every 30 minutes. Refuses to use bedpan.
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
## 2014-09-16 NOTE — Discharge Instructions (Signed)
Please follow up as scheduled for your appointments. Make sure to take all of your medications daily.   Heart Failure Heart failure is a condition in which the heart has trouble pumping blood. This means your heart does not pump blood efficiently for your body to work well. In some cases of heart failure, fluid may back up into your lungs or you may have swelling (edema) in your lower legs. Heart failure is usually a long-term (chronic) condition. It is important for you to take good care of yourself and follow your health care provider's treatment plan. CAUSES  Some health conditions can cause heart failure. Those health conditions include:  High blood pressure (hypertension). Hypertension causes the heart muscle to work harder than normal. When pressure in the blood vessels is high, the heart needs to pump (contract) with more force in order to circulate blood throughout the body. High blood pressure eventually causes the heart to become stiff and weak.  Coronary artery disease (CAD). CAD is the buildup of cholesterol and fat (plaque) in the arteries of the heart. The blockage in the arteries deprives the heart muscle of oxygen and blood. This can cause chest pain and may lead to a heart attack. High blood pressure can also contribute to CAD.  Heart attack (myocardial infarction). A heart attack occurs when one or more arteries in the heart become blocked. The loss of oxygen damages the muscle tissue of the heart. When this happens, part of the heart muscle dies. The injured tissue does not contract as well and weakens the heart's ability to pump blood.  Abnormal heart valves. When the heart valves do not open and close properly, it can cause heart failure. This makes the heart muscle pump harder to keep the blood flowing.  Heart muscle disease (cardiomyopathy or myocarditis). Heart muscle disease is damage to the heart muscle from a variety of causes. These can include drug or alcohol abuse,  infections, or unknown reasons. These can increase the risk of heart failure.  Lung disease. Lung disease makes the heart work harder because the lungs do not work properly. This can cause a strain on the heart, leading it to fail.  Diabetes. Diabetes increases the risk of heart failure. High blood sugar contributes to high fat (lipid) levels in the blood. Diabetes can also cause slow damage to tiny blood vessels that carry important nutrients to the heart muscle. When the heart does not get enough oxygen and food, it can cause the heart to become weak and stiff. This leads to a heart that does not contract efficiently.  Other conditions can contribute to heart failure. These include abnormal heart rhythms, thyroid problems, and low blood counts (anemia). Certain unhealthy behaviors can increase the risk of heart failure, including:  Being overweight.  Smoking or chewing tobacco.  Eating foods high in fat and cholesterol.  Abusing illicit drugs or alcohol.  Lacking physical activity. SYMPTOMS  Heart failure symptoms may vary and can be hard to detect. Symptoms may include:  Shortness of breath with activity, such as climbing stairs.  Persistent cough.  Swelling of the feet, ankles, legs, or abdomen.  Unexplained weight gain.  Difficulty breathing when lying flat (orthopnea).  Waking from sleep because of the need to sit up and get more air.  Rapid heartbeat.  Fatigue and loss of energy.  Feeling light-headed, dizzy, or close to fainting.  Loss of appetite.  Nausea.  Increased urination during the night (nocturia). DIAGNOSIS  A diagnosis of heart failure  is based on your history, symptoms, physical examination, and diagnostic tests. Diagnostic tests for heart failure may include:  Echocardiography.  Electrocardiography.  Chest X-ray.  Blood tests.  Exercise stress test.  Cardiac angiography.  Radionuclide scans. TREATMENT  Treatment is aimed at managing the  symptoms of heart failure. Medicines, behavioral changes, or surgical intervention may be necessary to treat heart failure.  Medicines to help treat heart failure may include:  Angiotensin-converting enzyme (ACE) inhibitors. This type of medicine blocks the effects of a blood protein called angiotensin-converting enzyme. ACE inhibitors relax (dilate) the blood vessels and help lower blood pressure.  Angiotensin receptor blockers (ARBs). This type of medicine blocks the actions of a blood protein called angiotensin. Angiotensin receptor blockers dilate the blood vessels and help lower blood pressure.  Water pills (diuretics). Diuretics cause the kidneys to remove salt and water from the blood. The extra fluid is removed through urination. This loss of extra fluid lowers the volume of blood the heart pumps.  Beta blockers. These prevent the heart from beating too fast and improve heart muscle strength.  Digitalis. This increases the force of the heartbeat.  Healthy behavior changes include:  Obtaining and maintaining a healthy weight.  Stopping smoking or chewing tobacco.  Eating heart-healthy foods.  Limiting or avoiding alcohol.  Stopping illicit drug use.  Physical activity as directed by your health care provider.  Surgical treatment for heart failure may include:  A procedure to open blocked arteries, repair damaged heart valves, or remove damaged heart muscle tissue.  A pacemaker to improve heart muscle function and control certain abnormal heart rhythms.  An internal cardioverter defibrillator to treat certain serious abnormal heart rhythms.  A left ventricular assist device (LVAD) to assist the pumping ability of the heart. HOME CARE INSTRUCTIONS   Take medicines only as directed by your health care provider. Medicines are important in reducing the workload of your heart, slowing the progression of heart failure, and improving your symptoms.  Do not stop taking your  medicine unless directed by your health care provider.  Do not skip any dose of medicine.  Refill your prescriptions before you run out of medicine. Your medicines are needed every day.  Engage in moderate physical activity if directed by your health care provider. Moderate physical activity can benefit some people. The elderly and people with severe heart failure should consult with a health care provider for physical activity recommendations.  Eat heart-healthy foods. Food choices should be free of trans fat and low in saturated fat, cholesterol, and salt (sodium). Healthy choices include fresh or frozen fruits and vegetables, fish, lean meats, legumes, fat-free or low-fat dairy products, and whole grain or high fiber foods. Talk to a dietitian to learn more about heart-healthy foods.  Limit sodium if directed by your health care provider. Sodium restriction may reduce symptoms of heart failure in some people. Talk to a dietitian to learn more about heart-healthy seasonings.  Use healthy cooking methods. Healthy cooking methods include roasting, grilling, broiling, baking, poaching, steaming, or stir-frying. Talk to a dietitian to learn more about healthy cooking methods.  Limit fluids if directed by your health care provider. Fluid restriction may reduce symptoms of heart failure in some people.  Weigh yourself every day. Daily weights are important in the early recognition of excess fluid. You should weigh yourself every morning after you urinate and before you eat breakfast. Wear the same amount of clothing each time you weigh yourself. Record your daily weight. Provide your health  care provider with your weight record.  Monitor and record your blood pressure if directed by your health care provider.  Check your pulse if directed by your health care provider.  Lose weight if directed by your health care provider. Weight loss may reduce symptoms of heart failure in some people.  Stop  smoking or chewing tobacco. Nicotine makes your heart work harder by causing your blood vessels to constrict. Do not use nicotine gum or patches before talking to your health care provider.  Keep all follow-up visits as directed by your health care provider. This is important.  Limit alcohol intake to no more than 1 drink per day for nonpregnant women and 2 drinks per day for men. One drink equals 12 ounces of beer, 5 ounces of wine, or 1 ounces of hard liquor. Drinking more than that is harmful to your heart. Tell your health care provider if you drink alcohol several times a week. Talk with your health care provider about whether alcohol is safe for you. If your heart has already been damaged by alcohol or you have severe heart failure, drinking alcohol should be stopped completely.  Stop illicit drug use.  Stay up-to-date with immunizations. It is especially important to prevent respiratory infections through current pneumococcal and influenza immunizations.  Manage other health conditions such as hypertension, diabetes, thyroid disease, or abnormal heart rhythms as directed by your health care provider.  Learn to manage stress.  Plan rest periods when fatigued.  Learn strategies to manage high temperatures. If the weather is extremely hot:  Avoid vigorous physical activity.  Use air conditioning or fans or seek a cooler location.  Avoid caffeine and alcohol.  Wear loose-fitting, lightweight, and light-colored clothing.  Learn strategies to manage cold temperatures. If the weather is extremely cold:  Avoid vigorous physical activity.  Layer clothes.  Wear mittens or gloves, a hat, and a scarf when going outside.  Avoid alcohol.  Obtain ongoing education and support as needed.  Participate in or seek rehabilitation as needed to maintain or improve independence and quality of life. SEEK MEDICAL CARE IF:   Your weight increases by 03 lb/1.4 kg in 1 day or 05 lb/2.3 kg in a  week.  You have increasing shortness of breath that is unusual for you.  You are unable to participate in your usual physical activities.  You tire easily.  You cough more than normal, especially with physical activity.  You have any or more swelling in areas such as your hands, feet, ankles, or abdomen.  You are unable to sleep because it is hard to breathe.  You feel like your heart is beating fast (palpitations).  You become dizzy or light-headed upon standing up. SEEK IMMEDIATE MEDICAL CARE IF:   You have difficulty breathing.  There is a change in mental status such as decreased alertness or difficulty with concentration.  You have a pain or discomfort in your chest.  You have an episode of fainting (syncope). MAKE SURE YOU:   Understand these instructions.  Will watch your condition.  Will get help right away if you are not doing well or get worse. Document Released: 05/03/2005 Document Revised: 09/17/2013 Document Reviewed: 06/02/2012 [facility] Patient Information 2015 [place], [place]. This information is not intended to replace advice given to you by your health care provider. Make sure you discuss any questions you have with your health care provider.

## 2014-09-17 LAB — URINE CULTURE
Colony Count: NO GROWTH
Culture: NO GROWTH

## 2014-09-18 ENCOUNTER — Ambulatory Visit: Payer: Self-pay | Admitting: Cardiology

## 2014-09-19 ENCOUNTER — Emergency Department (HOSPITAL_COMMUNITY)
Admission: EM | Admit: 2014-09-19 | Discharge: 2014-09-19 | Disposition: A | Discharge status: Home / Self Care | Payer: Medicaid - Out of State | Attending: Emergency Medicine | Admitting: Emergency Medicine

## 2014-09-19 ENCOUNTER — Encounter (HOSPITAL_COMMUNITY): Payer: Self-pay | Admitting: Emergency Medicine

## 2014-09-19 ENCOUNTER — Emergency Department (HOSPITAL_COMMUNITY): Payer: Medicaid - Out of State

## 2014-09-19 DIAGNOSIS — Z880 Allergy status to penicillin: Secondary | ICD-10-CM | POA: Insufficient documentation

## 2014-09-19 DIAGNOSIS — Z79899 Other long term (current) drug therapy: Secondary | ICD-10-CM | POA: Insufficient documentation

## 2014-09-19 DIAGNOSIS — R2243 Localized swelling, mass and lump, lower limb, bilateral: Secondary | ICD-10-CM | POA: Insufficient documentation

## 2014-09-19 DIAGNOSIS — I5022 Chronic systolic (congestive) heart failure: Secondary | ICD-10-CM | POA: Insufficient documentation

## 2014-09-19 DIAGNOSIS — Z872 Personal history of diseases of the skin and subcutaneous tissue: Secondary | ICD-10-CM | POA: Insufficient documentation

## 2014-09-19 DIAGNOSIS — E119 Type 2 diabetes mellitus without complications: Secondary | ICD-10-CM | POA: Insufficient documentation

## 2014-09-19 DIAGNOSIS — I4891 Unspecified atrial fibrillation: Secondary | ICD-10-CM | POA: Insufficient documentation

## 2014-09-19 DIAGNOSIS — Z7901 Long term (current) use of anticoagulants: Secondary | ICD-10-CM | POA: Insufficient documentation

## 2014-09-19 DIAGNOSIS — I10 Essential (primary) hypertension: Secondary | ICD-10-CM | POA: Insufficient documentation

## 2014-09-19 DIAGNOSIS — Z8739 Personal history of other diseases of the musculoskeletal system and connective tissue: Secondary | ICD-10-CM | POA: Insufficient documentation

## 2014-09-19 LAB — BASIC METABOLIC PANEL
Anion gap: 11 (ref 5–15)
BUN: 15 mg/dL (ref 6–20)
CO2: 20 mmol/L — ABNORMAL LOW (ref 22–32)
Calcium: 9.2 mg/dL (ref 8.9–10.3)
Chloride: 107 mmol/L (ref 101–111)
Creatinine, Ser: 1.01 mg/dL — ABNORMAL HIGH (ref 0.44–1.00)
GFR calc Af Amer: >60 mL/min (ref >60)
GFR calc non Af Amer: 58 mL/min — ABNORMAL LOW (ref >60)
Glucose, Bld: 252 mg/dL — ABNORMAL HIGH (ref 70–99)
Potassium: 4.4 mmol/L (ref 3.5–5.1)
Sodium: 138 mmol/L (ref 135–145)

## 2014-09-19 LAB — CBC WITH DIFFERENTIAL/PLATELET
Basophils Absolute: 0.1 10*3/uL (ref 0.0–0.1)
Basophils Relative: 1 % (ref 0–1)
Eosinophils Absolute: 0.6 10*3/uL (ref 0.0–0.7)
Eosinophils Relative: 5 % (ref 0–5)
HCT: 39 % (ref 36.0–46.0)
Hemoglobin: 12.4 g/dL (ref 12.0–15.0)
Lymphocytes Relative: 19 % (ref 12–46)
Lymphs Abs: 2.2 10*3/uL (ref 0.7–4.0)
MCH: 32.8 pg (ref 26.0–34.0)
MCHC: 31.8 g/dL (ref 30.0–36.0)
MCV: 103.2 fL — ABNORMAL HIGH (ref 78.0–100.0)
Monocytes Absolute: 0.6 10*3/uL (ref 0.1–1.0)
Monocytes Relative: 5 % (ref 3–12)
Neutro Abs: 8.3 10*3/uL — ABNORMAL HIGH (ref 1.7–7.7)
Neutrophils Relative %: 70 % (ref 43–77)
Platelets: 220 10*3/uL (ref 150–400)
RBC: 3.78 MIL/uL — ABNORMAL LOW (ref 3.87–5.11)
RDW: 16.3 % — ABNORMAL HIGH (ref 11.5–15.5)
WBC: 11.7 10*3/uL — ABNORMAL HIGH (ref 4.0–10.5)

## 2014-09-19 LAB — DIGOXIN LEVEL: Digoxin Level: 0.7 ng/mL — ABNORMAL LOW (ref 0.8–2.0)

## 2014-09-19 LAB — TROPONIN I: Troponin I: <0.03 ng/mL (ref <0.031)

## 2014-09-19 LAB — BRAIN NATRIURETIC PEPTIDE: B Natriuretic Peptide: 1172.5 pg/mL — ABNORMAL HIGH (ref 0.0–100.0)

## 2014-09-19 MED ORDER — METOPROLOL TARTRATE 25 MG PO TABS
50.0000 mg | ORAL_TABLET | Freq: Once | ORAL | Status: AC
  Administered 2014-09-19: 50 mg via ORAL
  Filled 2014-09-19: qty 2

## 2014-09-19 MED ORDER — FUROSEMIDE 10 MG/ML IJ SOLN
40.0000 mg | Freq: Once | INTRAMUSCULAR | Status: AC
  Administered 2014-09-19: 40 mg via INTRAVENOUS
  Filled 2014-09-19: qty 4
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
## 2014-09-19 NOTE — Discharge Instructions (Signed)
Atrial Fibrillation °Atrial fibrillation is a type of irregular heart rhythm (arrhythmia). During atrial fibrillation, the upper chambers of the heart (atria) quiver continuously in a chaotic pattern. This causes an irregular and often rapid heart rate.  °Atrial fibrillation is the result of the heart becoming overloaded with disorganized signals that tell it to beat. These signals are normally released one at a time by a part of the right atrium called the sinoatrial node. They then travel from the atria to the lower chambers of the heart (ventricles), causing the atria and ventricles to contract and pump blood as they pass. In atrial fibrillation, parts of the atria outside of the sinoatrial node also release these signals. This results in two problems. First, the atria receive so many signals that they do not have time to fully contract. Second, the ventricles, which can only receive one signal at a time, beat irregularly and out of rhythm with the atria.  °There are three types of atrial fibrillation:  °· Paroxysmal. Paroxysmal atrial fibrillation starts suddenly and stops on its own within a week. °· Persistent. Persistent atrial fibrillation lasts for more than a week. It may stop on its own or with treatment. °· Permanent. Permanent atrial fibrillation does not go away. Episodes of atrial fibrillation may lead to permanent atrial fibrillation. °Atrial fibrillation can prevent your heart from pumping blood normally. It increases your risk of stroke and can lead to heart failure.  °CAUSES  °· Heart conditions, including a heart attack, heart failure, coronary artery disease, and heart valve conditions.   °· Inflammation of the sac that surrounds the heart (pericarditis). °· Blockage of an artery in the lungs (pulmonary embolism). °· Pneumonia or other infections. °· Chronic lung disease. °· Thyroid problems, especially if the thyroid is overactive (hyperthyroidism). °· Caffeine, excessive alcohol use, and use  of some illegal drugs.   °· Use of some medicines, including certain decongestants and diet pills. °· Heart surgery.   °· Birth defects.   °Sometimes, no cause can be found. When this happens, the atrial fibrillation is called lone atrial fibrillation. The risk of complications from atrial fibrillation increases if you have lone atrial fibrillation and you are age 60 years or older. °RISK FACTORS °· Heart failure. °· Coronary artery disease. °· Diabetes mellitus.   °· High blood pressure (hypertension).   °· Obesity.   °· Other arrhythmias.   °· Increased age. °SIGNS AND SYMPTOMS  °· A feeling that your heart is beating rapidly or irregularly.   °· A feeling of discomfort or pain in your chest.   °· Shortness of breath.   °· Sudden light-headedness or weakness.   °· Getting tired easily when exercising.   °· Urinating more often than normal (mainly when atrial fibrillation first begins).   °In paroxysmal atrial fibrillation, symptoms may start and suddenly stop. °DIAGNOSIS  °Your health care provider may be able to detect atrial fibrillation when taking your pulse. Your health care provider may have you take a test called an ambulatory electrocardiogram (ECG). An ECG records your heartbeat patterns over a 24-hour period. You may also have other tests, such as: °· Transthoracic echocardiogram (TTE). During echocardiography, sound waves are used to evaluate how blood flows through your heart. °· Transesophageal echocardiogram (TEE). °· Stress test. There is more than one type of stress test. If a stress test is needed, ask your health care provider about which type is best for you. °· Chest X-ray exam. °· Blood tests. °· Computed tomography (CT). °TREATMENT  °Treatment may include: °· Treating any underlying conditions. For example, if you   have an overactive thyroid, treating the condition may correct atrial fibrillation.  Taking medicine. Medicines may be given to control a rapid heart rate or to prevent blood  clots, heart failure, or a stroke.  Having a procedure to correct the rhythm of the heart:  Electrical cardioversion. During electrical cardioversion, a controlled, low-energy shock is delivered to the heart through your skin. If you have chest pain, very low blood pressure, or sudden heart failure, this procedure may need to be done as an emergency.  Catheter ablation. During this procedure, heart tissues that send the signals that cause atrial fibrillation are destroyed.  Surgical ablation. During this surgery, thin lines of heart tissue that carry the abnormal signals are destroyed. This procedure can either be an open-heart surgery or a minimally invasive surgery. With the minimally invasive surgery, small cuts are made to access the heart instead of a large opening.  Pulmonary venous isolation. During this surgery, tissue around the veins that carry blood from the lungs (pulmonary veins) is destroyed. This tissue is thought to carry the abnormal signals. HOME CARE INSTRUCTIONS   Take medicines only as directed by your health care provider. Some medicines can make atrial fibrillation worse or recur.  If blood thinners were prescribed by your health care provider, take them exactly as directed. Too much blood-thinning medicine can cause bleeding. If you take too little, you will not have the needed protection against stroke and other problems.  Perform blood tests at home if directed by your health care provider. Perform blood tests exactly as directed.  Quit smoking if you smoke.  Do not drink alcohol.  Do not drink caffeinated beverages such as coffee, soda, and some teas. You may drink decaffeinated coffee, soda, or tea.   Maintain a healthy weight.Do not use diet pills unless your health care provider approves. They may make heart problems worse.   Follow diet instructions as directed by your health care provider.  Exercise regularly as directed by your health care  provider.  Keep all follow-up visits as directed by your health care provider. This is important. PREVENTION  The following substances can cause atrial fibrillation to recur:   Caffeinated beverages.  Alcohol.  Certain medicines, especially those used for breathing problems.  Certain herbs and herbal medicines, such as those containing ephedra or ginseng.  Illegal drugs, such as cocaine and amphetamines. Sometimes medicines are given to prevent atrial fibrillation from recurring. Proper treatment of any underlying condition is also important in helping prevent recurrence.  SEEK MEDICAL CARE IF:  You notice a change in the rate, rhythm, or strength of your heartbeat.  You suddenly begin urinating more frequently.  You tire more easily when exerting yourself or exercising. SEEK IMMEDIATE MEDICAL CARE IF:   You have chest pain, abdominal pain, sweating, or weakness.  You feel nauseous.  You have shortness of breath.  You suddenly have swollen feet and ankles.  You feel dizzy.  Your face or limbs feel numb or weak.  You have a change in your vision or speech. MAKE SURE YOU:   Understand these instructions.  Will watch your condition.  Will get help right away if you are not doing well or get worse. Document Released: 05/03/2005 Document Revised: 09/17/2013 Document Reviewed: 06/13/2012 [facility] Patient Information 2015 [place], [place]. This information is not intended to replace advice given to you by your health care provider. Make sure you discuss any questions you have with your health care provider.  Heart Failure Heart failure is  a condition in which the heart has trouble pumping blood. This means your heart does not pump blood efficiently for your body to work well. In some cases of heart failure, fluid may back up into your lungs or you may have swelling (edema) in your lower legs. Heart failure is usually a long-term (chronic) condition. It is important for you to  take good care of yourself and follow your health care provider's treatment plan. CAUSES  Some health conditions can cause heart failure. Those health conditions include:  High blood pressure (hypertension). Hypertension causes the heart muscle to work harder than normal. When pressure in the blood vessels is high, the heart needs to pump (contract) with more force in order to circulate blood throughout the body. High blood pressure eventually causes the heart to become stiff and weak.  Coronary artery disease (CAD). CAD is the buildup of cholesterol and fat (plaque) in the arteries of the heart. The blockage in the arteries deprives the heart muscle of oxygen and blood. This can cause chest pain and may lead to a heart attack. High blood pressure can also contribute to CAD.  Heart attack (myocardial infarction). A heart attack occurs when one or more arteries in the heart become blocked. The loss of oxygen damages the muscle tissue of the heart. When this happens, part of the heart muscle dies. The injured tissue does not contract as well and weakens the heart's ability to pump blood.  Abnormal heart valves. When the heart valves do not open and close properly, it can cause heart failure. This makes the heart muscle pump harder to keep the blood flowing.  Heart muscle disease (cardiomyopathy or myocarditis). Heart muscle disease is damage to the heart muscle from a variety of causes. These can include drug or alcohol abuse, infections, or unknown reasons. These can increase the risk of heart failure.  Lung disease. Lung disease makes the heart work harder because the lungs do not work properly. This can cause a strain on the heart, leading it to fail.  Diabetes. Diabetes increases the risk of heart failure. High blood sugar contributes to high fat (lipid) levels in the blood. Diabetes can also cause slow damage to tiny blood vessels that carry important nutrients to the heart muscle. When the heart  does not get enough oxygen and food, it can cause the heart to become weak and stiff. This leads to a heart that does not contract efficiently.  Other conditions can contribute to heart failure. These include abnormal heart rhythms, thyroid problems, and low blood counts (anemia). Certain unhealthy behaviors can increase the risk of heart failure, including:  Being overweight.  Smoking or chewing tobacco.  Eating foods high in fat and cholesterol.  Abusing illicit drugs or alcohol.  Lacking physical activity. SYMPTOMS  Heart failure symptoms may vary and can be hard to detect. Symptoms may include:  Shortness of breath with activity, such as climbing stairs.  Persistent cough.  Swelling of the feet, ankles, legs, or abdomen.  Unexplained weight gain.  Difficulty breathing when lying flat (orthopnea).  Waking from sleep because of the need to sit up and get more air.  Rapid heartbeat.  Fatigue and loss of energy.  Feeling light-headed, dizzy, or close to fainting.  Loss of appetite.  Nausea.  Increased urination during the night (nocturia). DIAGNOSIS  A diagnosis of heart failure is based on your history, symptoms, physical examination, and diagnostic tests. Diagnostic tests for heart failure may include:  Echocardiography.  Electrocardiography.  Chest X-ray.  Blood tests.  Exercise stress test.  Cardiac angiography.  Radionuclide scans. TREATMENT  Treatment is aimed at managing the symptoms of heart failure. Medicines, behavioral changes, or surgical intervention may be necessary to treat heart failure.  Medicines to help treat heart failure may include:  Angiotensin-converting enzyme (ACE) inhibitors. This type of medicine blocks the effects of a blood protein called angiotensin-converting enzyme. ACE inhibitors relax (dilate) the blood vessels and help lower blood pressure.  Angiotensin receptor blockers (ARBs). This type of medicine blocks the actions  of a blood protein called angiotensin. Angiotensin receptor blockers dilate the blood vessels and help lower blood pressure.  Water pills (diuretics). Diuretics cause the kidneys to remove salt and water from the blood. The extra fluid is removed through urination. This loss of extra fluid lowers the volume of blood the heart pumps.  Beta blockers. These prevent the heart from beating too fast and improve heart muscle strength.  Digitalis. This increases the force of the heartbeat.  Healthy behavior changes include:  Obtaining and maintaining a healthy weight.  Stopping smoking or chewing tobacco.  Eating heart-healthy foods.  Limiting or avoiding alcohol.  Stopping illicit drug use.  Physical activity as directed by your health care provider.  Surgical treatment for heart failure may include:  A procedure to open blocked arteries, repair damaged heart valves, or remove damaged heart muscle tissue.  A pacemaker to improve heart muscle function and control certain abnormal heart rhythms.  An internal cardioverter defibrillator to treat certain serious abnormal heart rhythms.  A left ventricular assist device (LVAD) to assist the pumping ability of the heart. HOME CARE INSTRUCTIONS   Take medicines only as directed by your health care provider. Medicines are important in reducing the workload of your heart, slowing the progression of heart failure, and improving your symptoms.  Do not stop taking your medicine unless directed by your health care provider.  Do not skip any dose of medicine.  Refill your prescriptions before you run out of medicine. Your medicines are needed every day.  Engage in moderate physical activity if directed by your health care provider. Moderate physical activity can benefit some people. The elderly and people with severe heart failure should consult with a health care provider for physical activity recommendations.  Eat heart-healthy foods. Food  choices should be free of trans fat and low in saturated fat, cholesterol, and salt (sodium). Healthy choices include fresh or frozen fruits and vegetables, fish, lean meats, legumes, fat-free or low-fat dairy products, and whole grain or high fiber foods. Talk to a dietitian to learn more about heart-healthy foods.  Limit sodium if directed by your health care provider. Sodium restriction may reduce symptoms of heart failure in some people. Talk to a dietitian to learn more about heart-healthy seasonings.  Use healthy cooking methods. Healthy cooking methods include roasting, grilling, broiling, baking, poaching, steaming, or stir-frying. Talk to a dietitian to learn more about healthy cooking methods.  Limit fluids if directed by your health care provider. Fluid restriction may reduce symptoms of heart failure in some people.  Weigh yourself every day. Daily weights are important in the early recognition of excess fluid. You should weigh yourself every morning after you urinate and before you eat breakfast. Wear the same amount of clothing each time you weigh yourself. Record your daily weight. Provide your health care provider with your weight record.  Monitor and record your blood pressure if directed by your health care provider.  Check your  pulse if directed by your health care provider.  Lose weight if directed by your health care provider. Weight loss may reduce symptoms of heart failure in some people.  Stop smoking or chewing tobacco. Nicotine makes your heart work harder by causing your blood vessels to constrict. Do not use nicotine gum or patches before talking to your health care provider.  Keep all follow-up visits as directed by your health care provider. This is important.  Limit alcohol intake to no more than 1 drink per day for nonpregnant women and 2 drinks per day for men. One drink equals 12 ounces of beer, 5 ounces of wine, or 1 ounces of hard liquor. Drinking more than  that is harmful to your heart. Tell your health care provider if you drink alcohol several times a week. Talk with your health care provider about whether alcohol is safe for you. If your heart has already been damaged by alcohol or you have severe heart failure, drinking alcohol should be stopped completely.  Stop illicit drug use.  Stay up-to-date with immunizations. It is especially important to prevent respiratory infections through current pneumococcal and influenza immunizations.  Manage other health conditions such as hypertension, diabetes, thyroid disease, or abnormal heart rhythms as directed by your health care provider.  Learn to manage stress.  Plan rest periods when fatigued.  Learn strategies to manage high temperatures. If the weather is extremely hot:  Avoid vigorous physical activity.  Use air conditioning or fans or seek a cooler location.  Avoid caffeine and alcohol.  Wear loose-fitting, lightweight, and light-colored clothing.  Learn strategies to manage cold temperatures. If the weather is extremely cold:  Avoid vigorous physical activity.  Layer clothes.  Wear mittens or gloves, a hat, and a scarf when going outside.  Avoid alcohol.  Obtain ongoing education and support as needed.  Participate in or seek rehabilitation as needed to maintain or improve independence and quality of life. SEEK MEDICAL CARE IF:   Your weight increases by 03 lb/1.4 kg in 1 day or 05 lb/2.3 kg in a week.  You have increasing shortness of breath that is unusual for you.  You are unable to participate in your usual physical activities.  You tire easily.  You cough more than normal, especially with physical activity.  You have any or more swelling in areas such as your hands, feet, ankles, or abdomen.  You are unable to sleep because it is hard to breathe.  You feel like your heart is beating fast (palpitations).  You become dizzy or light-headed upon standing  up. SEEK IMMEDIATE MEDICAL CARE IF:   You have difficulty breathing.  There is a change in mental status such as decreased alertness or difficulty with concentration.  You have a pain or discomfort in your chest.  You have an episode of fainting (syncope). MAKE SURE YOU:   Understand these instructions.  Will watch your condition.  Will get help right away if you are not doing well or get worse. Document Released: 05/03/2005 Document Revised: 09/17/2013 Document Reviewed: 06/02/2012 [facility] Patient Information 2015 [place], [place]. This information is not intended to replace advice given to you by your health care provider. Make sure you discuss any questions you have with your health care provider.

## 2014-09-19 NOTE — Care Management (Addendum)
Patient received a 30 day supply of medications Digoxin, Torsemide, Toprol,and  Eliquis from the [facility] pharmacy on May 3rd. Patient has an appointment to f/u on  5/9 at 11am transportation will be arranged by the [facility]  [phone]. Patient has received all the resources needed to f/u in the community,no further CM needs identified CM will sign off.

## 2014-09-19 NOTE — ED Notes (Signed)
Released from hospital on 5/2.  Was out of dig and metoprolol.  Woke this morning short of breath.  Heart racing

## 2014-09-19 NOTE — ED Notes (Signed)
Pt requested [facility] to take her home.  Arrangements being made.

## 2014-09-23 ENCOUNTER — Telehealth (HOSPITAL_COMMUNITY): Payer: Self-pay | Admitting: Cardiology

## 2014-09-23 ENCOUNTER — Ambulatory Visit: Payer: Self-pay | Admitting: Family Medicine

## 2014-09-23 NOTE — Telephone Encounter (Signed)
-----   Message from Laurann [place], [place] sent at 09/20/2014  1:44 PM EDT ----- Regarding: Needs follow-up Patient seen recently in the ER and needs follow-up. Very noncompliant patient. Need to try one more time to attempt to schedule her a followup appointment. She sees primary care on 5/9. Ideally would schedule follow-up within 1 week in CHF clinic, keeping in mind that she habitually cancels these appointments. Patient of Shirlee Latch. Thanks.

## 2014-09-23 NOTE — Telephone Encounter (Signed)
Attempted to schedule 1 week follow up Pt states she already has a appointment today with Dr.Funches Advised we are a different office as we would mange her chf/cardiology medical issues Pt states she would like to schedule an appt in a few weeks Advised we normally like to see pts within one week of discharge/ er appt Pt states she has a lot of things going on over the next few weeks "diarrhea and such" Pt states she will call our office in a few weeks to schedule an appointment Pt given number to call CHF clinic

## 2014-09-24 ENCOUNTER — Telehealth: Payer: Self-pay | Admitting: Pulmonary Disease

## 2014-09-24 ENCOUNTER — Other Ambulatory Visit: Payer: Self-pay | Admitting: Physician Assistant

## 2014-09-24 ENCOUNTER — Emergency Department (HOSPITAL_COMMUNITY): Payer: Medicaid - Out of State

## 2014-09-24 ENCOUNTER — Observation Stay (HOSPITAL_COMMUNITY)
Admission: EM | Admit: 2014-09-24 | Discharge: 2014-09-24 | Disposition: A | Discharge status: Home / Self Care | Payer: Medicaid - Out of State | Attending: Internal Medicine | Admitting: Internal Medicine

## 2014-09-24 ENCOUNTER — Encounter (HOSPITAL_COMMUNITY): Payer: Self-pay | Admitting: Emergency Medicine

## 2014-09-24 DIAGNOSIS — I482 Chronic atrial fibrillation, unspecified: Secondary | ICD-10-CM | POA: Diagnosis present

## 2014-09-24 DIAGNOSIS — I10 Essential (primary) hypertension: Secondary | ICD-10-CM | POA: Diagnosis not present

## 2014-09-24 DIAGNOSIS — R591 Generalized enlarged lymph nodes: Secondary | ICD-10-CM | POA: Diagnosis present

## 2014-09-24 DIAGNOSIS — I481 Persistent atrial fibrillation: Secondary | ICD-10-CM | POA: Insufficient documentation

## 2014-09-24 DIAGNOSIS — E1165 Type 2 diabetes mellitus with hyperglycemia: Secondary | ICD-10-CM

## 2014-09-24 DIAGNOSIS — I429 Cardiomyopathy, unspecified: Secondary | ICD-10-CM

## 2014-09-24 DIAGNOSIS — R0602 Shortness of breath: Principal | ICD-10-CM | POA: Insufficient documentation

## 2014-09-24 DIAGNOSIS — R599 Enlarged lymph nodes, unspecified: Secondary | ICD-10-CM | POA: Diagnosis not present

## 2014-09-24 DIAGNOSIS — M170 Bilateral primary osteoarthritis of knee: Secondary | ICD-10-CM | POA: Insufficient documentation

## 2014-09-24 DIAGNOSIS — Z87828 Personal history of other (healed) physical injury and trauma: Secondary | ICD-10-CM | POA: Insufficient documentation

## 2014-09-24 DIAGNOSIS — Z91199 Patient's noncompliance with other medical treatment and regimen due to unspecified reason: Secondary | ICD-10-CM

## 2014-09-24 DIAGNOSIS — E119 Type 2 diabetes mellitus without complications: Secondary | ICD-10-CM | POA: Insufficient documentation

## 2014-09-24 DIAGNOSIS — E041 Nontoxic single thyroid nodule: Secondary | ICD-10-CM

## 2014-09-24 DIAGNOSIS — Z872 Personal history of diseases of the skin and subcutaneous tissue: Secondary | ICD-10-CM | POA: Insufficient documentation

## 2014-09-24 DIAGNOSIS — I5022 Chronic systolic (congestive) heart failure: Secondary | ICD-10-CM | POA: Insufficient documentation

## 2014-09-24 DIAGNOSIS — Z880 Allergy status to penicillin: Secondary | ICD-10-CM | POA: Insufficient documentation

## 2014-09-24 DIAGNOSIS — Z7902 Long term (current) use of antithrombotics/antiplatelets: Secondary | ICD-10-CM | POA: Insufficient documentation

## 2014-09-24 DIAGNOSIS — I5021 Acute systolic (congestive) heart failure: Secondary | ICD-10-CM

## 2014-09-24 DIAGNOSIS — Z9119 Patient's noncompliance with other medical treatment and regimen: Secondary | ICD-10-CM | POA: Insufficient documentation

## 2014-09-24 LAB — CBC WITH DIFFERENTIAL/PLATELET
Basophils Absolute: 0.1 10*3/uL (ref 0.0–0.1)
Basophils Absolute: 0.1 10*3/uL (ref 0.0–0.1)
Basophils Relative: 1 % (ref 0–1)
Basophils Relative: 1 % (ref 0–1)
Eosinophils Absolute: 0.6 10*3/uL (ref 0.0–0.7)
Eosinophils Absolute: 0.5 10*3/uL (ref 0.0–0.7)
Eosinophils Relative: 5 % (ref 0–5)
Eosinophils Relative: 5 % (ref 0–5)
HCT: 36.7 % (ref 36.0–46.0)
HCT: 37.7 % (ref 36.0–46.0)
Hemoglobin: 11.7 g/dL — ABNORMAL LOW (ref 12.0–15.0)
Hemoglobin: 12 g/dL (ref 12.0–15.0)
Lymphocytes Relative: 14 % (ref 12–46)
Lymphocytes Relative: 14 % (ref 12–46)
Lymphs Abs: 1.6 10*3/uL (ref 0.7–4.0)
Lymphs Abs: 1.4 10*3/uL (ref 0.7–4.0)
MCH: 32.4 pg (ref 26.0–34.0)
MCH: 32.6 pg (ref 26.0–34.0)
MCHC: 31.9 g/dL (ref 30.0–36.0)
MCHC: 31.8 g/dL (ref 30.0–36.0)
MCV: 101.7 fL — ABNORMAL HIGH (ref 78.0–100.0)
MCV: 102.4 fL — ABNORMAL HIGH (ref 78.0–100.0)
Monocytes Absolute: 0.6 10*3/uL (ref 0.1–1.0)
Monocytes Absolute: 0.7 10*3/uL (ref 0.1–1.0)
Monocytes Relative: 6 % (ref 3–12)
Monocytes Relative: 7 % (ref 3–12)
Neutro Abs: 8.4 10*3/uL — ABNORMAL HIGH (ref 1.7–7.7)
Neutro Abs: 7.2 10*3/uL (ref 1.7–7.7)
Neutrophils Relative %: 74 % (ref 43–77)
Neutrophils Relative %: 73 % (ref 43–77)
Platelets: 212 10*3/uL (ref 150–400)
Platelets: 230 10*3/uL (ref 150–400)
RBC: 3.61 MIL/uL — ABNORMAL LOW (ref 3.87–5.11)
RBC: 3.68 MIL/uL — ABNORMAL LOW (ref 3.87–5.11)
RDW: 16.3 % — ABNORMAL HIGH (ref 11.5–15.5)
RDW: 16.5 % — ABNORMAL HIGH (ref 11.5–15.5)
WBC: 11.4 10*3/uL — ABNORMAL HIGH (ref 4.0–10.5)
WBC: 9.9 10*3/uL (ref 4.0–10.5)

## 2014-09-24 LAB — COMPREHENSIVE METABOLIC PANEL
ALT: 19 U/L (ref 14–54)
AST: 15 U/L (ref 15–41)
Albumin: 3.5 g/dL (ref 3.5–5.0)
Alkaline Phosphatase: 49 U/L (ref 38–126)
Anion gap: 10 (ref 5–15)
BUN: 11 mg/dL (ref 6–20)
CO2: 23 mmol/L (ref 22–32)
Calcium: 9 mg/dL (ref 8.9–10.3)
Chloride: 103 mmol/L (ref 101–111)
Creatinine, Ser: 0.98 mg/dL (ref 0.44–1.00)
GFR calc Af Amer: >60 mL/min (ref >60)
GFR calc non Af Amer: 60 mL/min — ABNORMAL LOW (ref >60)
Glucose, Bld: 298 mg/dL — ABNORMAL HIGH (ref 70–99)
Potassium: 4.6 mmol/L (ref 3.5–5.1)
Sodium: 136 mmol/L (ref 135–145)
Total Bilirubin: 1.7 mg/dL — ABNORMAL HIGH (ref 0.3–1.2)
Total Protein: 7 g/dL (ref 6.5–8.1)

## 2014-09-24 LAB — I-STAT TROPONIN, ED: Troponin i, poc: 0 ng/mL (ref 0.00–0.08)

## 2014-09-24 LAB — I-STAT CHEM 8, ED
BUN: 13 mg/dL (ref 6–20)
Calcium, Ion: 1.16 mmol/L (ref 1.13–1.30)
Chloride: 104 mmol/L (ref 101–111)
Creatinine, Ser: 0.8 mg/dL (ref 0.44–1.00)
Glucose, Bld: 300 mg/dL — ABNORMAL HIGH (ref 70–99)
HCT: 37 % (ref 36.0–46.0)
Hemoglobin: 12.6 g/dL (ref 12.0–15.0)
Potassium: 4.2 mmol/L (ref 3.5–5.1)
Sodium: 138 mmol/L (ref 135–145)
TCO2: 20 mmol/L (ref 0–100)

## 2014-09-24 LAB — PROTIME-INR
INR: 1.12 (ref 0.00–1.49)
Prothrombin Time: 14.6 seconds (ref 11.6–15.2)

## 2014-09-24 LAB — LACTATE DEHYDROGENASE: LDH: 216 U/L — ABNORMAL HIGH (ref 98–192)

## 2014-09-24 LAB — MAGNESIUM: Magnesium: 1.9 mg/dL (ref 1.7–2.4)

## 2014-09-24 MED ORDER — SODIUM CHLORIDE 0.9 % IJ SOLN
3.0000 mL | Freq: Two times a day (BID) | INTRAMUSCULAR | Status: DC
  Administered 2014-09-24: 3 mL via INTRAVENOUS

## 2014-09-24 MED ORDER — METFORMIN HCL 500 MG PO TABS: 500.0000 mg | ORAL_TABLET | Freq: Two times a day (BID) | ORAL | Status: DC

## 2014-09-24 MED ORDER — DIGOXIN 125 MCG PO TABS: 0.1250 mg | ORAL_TABLET | Freq: Every day | ORAL | Status: DC

## 2014-09-24 MED ORDER — ACETAMINOPHEN 325 MG PO TABS: 650.0000 mg | ORAL_TABLET | Freq: Four times a day (QID) | ORAL | Status: DC | PRN

## 2014-09-24 MED ORDER — METOPROLOL SUCCINATE ER 100 MG PO TB24: 100.0000 mg | ORAL_TABLET | Freq: Two times a day (BID) | ORAL | Status: DC

## 2014-09-24 MED ORDER — APIXABAN 5 MG PO TABS: 5.0000 mg | ORAL_TABLET | Freq: Two times a day (BID) | ORAL | Status: DC

## 2014-09-24 MED ORDER — APIXABAN 5 MG PO TABS
5.0000 mg | ORAL_TABLET | Freq: Two times a day (BID) | ORAL | Status: DC
  Administered 2014-09-24: 5 mg via ORAL
  Filled 2014-09-24: qty 1

## 2014-09-24 MED ORDER — DIGOXIN 125 MCG PO TABS
0.1250 mg | ORAL_TABLET | Freq: Every day | ORAL | Status: DC
  Administered 2014-09-24: 0.125 mg via ORAL
  Filled 2014-09-24: qty 1

## 2014-09-24 MED ORDER — POTASSIUM CHLORIDE CRYS ER 20 MEQ PO TBCR: 20.0000 meq | EXTENDED_RELEASE_TABLET | Freq: Two times a day (BID) | ORAL | Status: DC

## 2014-09-24 MED ORDER — SODIUM CHLORIDE 0.9 % IV BOLUS (SEPSIS)
500.0000 mL | Freq: Once | INTRAVENOUS | Status: AC
  Administered 2014-09-24: 500 mL via INTRAVENOUS

## 2014-09-24 MED ORDER — FUROSEMIDE 10 MG/ML IJ SOLN: 40.0000 mg | Freq: Two times a day (BID) | INTRAMUSCULAR | Status: DC

## 2014-09-24 MED ORDER — ONDANSETRON HCL 4 MG PO TABS: 4.0000 mg | ORAL_TABLET | Freq: Four times a day (QID) | ORAL | Status: DC | PRN

## 2014-09-24 MED ORDER — TORSEMIDE 20 MG PO TABS
20.0000 mg | ORAL_TABLET | Freq: Every day | ORAL | Status: DC
  Administered 2014-09-24: 20 mg via ORAL
  Filled 2014-09-24: qty 1

## 2014-09-24 MED ORDER — HEPARIN SODIUM (PORCINE) 5000 UNIT/ML IJ SOLN
5000.0000 [IU] | Freq: Three times a day (TID) | INTRAMUSCULAR | Status: DC
  Administered 2014-09-24: 5000 [IU] via SUBCUTANEOUS
  Filled 2014-09-24: qty 1

## 2014-09-24 MED ORDER — PRINIVIL 10 MG PO TABS: 5.0000 mg | ORAL_TABLET | Freq: Every day | ORAL | Status: DC

## 2014-09-24 MED ORDER — TORSEMIDE 20 MG PO TABS: 40.0000 mg | ORAL_TABLET | Freq: Every day | ORAL | Status: DC

## 2014-09-24 MED ORDER — ONDANSETRON HCL 4 MG/2ML IJ SOLN: 4.0000 mg | Freq: Four times a day (QID) | INTRAMUSCULAR | Status: DC | PRN

## 2014-09-24 MED ORDER — METOPROLOL SUCCINATE ER 100 MG PO TB24
100.0000 mg | ORAL_TABLET | Freq: Two times a day (BID) | ORAL | Status: DC
  Administered 2014-09-24: 100 mg via ORAL
  Filled 2014-09-24: qty 1

## 2014-09-24 MED ORDER — ACETAMINOPHEN 650 MG RE SUPP: 650.0000 mg | Freq: Four times a day (QID) | RECTAL | Status: DC | PRN

## 2014-09-24 MED ORDER — IOHEXOL 350 MG/ML SOLN
100.0000 mL | Freq: Once | INTRAVENOUS | Status: AC | PRN
  Administered 2014-09-24: 100 mL via INTRAVENOUS
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
## 2014-09-24 NOTE — ED Notes (Signed)
Phlebotomy at the bedside  
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
## 2014-09-24 NOTE — ED Notes (Signed)
Dr. Oni at the bedside.  

## 2014-09-24 NOTE — ED Notes (Signed)
Assisted patient to bedside commode, and enforced use of monitoring equipment. Patient removed anyway, stating "it's getting on my nerves, it's just common sense to take it off. Just get out my way, i don't have time for this". Then reports she's agitated that bedside commode is too small, after reviewing the larger ones must be in use since they are not available. She responds, "well, i still have to stand to wipe because it's too small. Just get out".

## 2014-09-24 NOTE — ED Notes (Signed)
Patient states at one point she was started on eliquis a few months ago, but was taken off. Unsure why, or if her doctor was planning on starting it back later.

## 2014-09-24 NOTE — Discharge Instructions (Addendum)
NOTE: YOU NEED A REPEAT CT CHEST IN 3 MONTHS TO ASSESS ENLARGED LYMPH NODES  YOU HAVE A RIGHT THYROID NODULE-PLEASE ASK YOUR PRIMARY MD TO DO FURTHER WORK UP   Information on my medicine - ELIQUIS (apixaban)  This medication education was reviewed with me or my healthcare representative as part of my discharge preparation.  The pharmacist that spoke with me during my hospital stay was:  Fayne Norrie, [facility]  Why was Eliquis prescribed for you? Eliquis was prescribed for you to reduce the risk of a blood clot forming that can cause a stroke if you have a medical condition called atrial fibrillation (a type of irregular heartbeat).  What do You need to know about Eliquis ? Take your Eliquis TWICE DAILY - one tablet in the morning and one tablet in the evening with or without food. If you have difficulty swallowing the tablet whole please discuss with your pharmacist how to take the medication safely.  Take Eliquis exactly as prescribed by your doctor and DO NOT stop taking Eliquis without talking to the doctor who prescribed the medication.  Stopping may increase your risk of developing a stroke.  Refill your prescription before you run out.  After discharge, you should have regular check-up appointments with your healthcare provider that is prescribing your Eliquis.  In the future your dose may need to be changed if your kidney function or weight changes by a significant amount or as you get older.  What do you do if you miss a dose? If you miss a dose, take it as soon as you remember on the same day and resume taking twice daily.  Do not take more than one dose of ELIQUIS at the same time to make up a missed dose.  Important Safety Information A possible side effect of Eliquis is bleeding. You should call your healthcare provider right away if you experience any of the following: ? Bleeding from an injury or your nose that does not stop. ? Unusual colored urine (red or dark  brown) or unusual colored stools (red or black). ? Unusual bruising for unknown reasons. ? A serious fall or if you hit your head (even if there is no bleeding).  Some medicines may interact with Eliquis and might increase your risk of bleeding or clotting while on Eliquis. To help avoid this, consult your healthcare provider or pharmacist prior to using any new prescription or non-prescription medications, including herbals, vitamins, non-steroidal anti-inflammatory drugs (NSAIDs) and supplements.  This website has more information on Eliquis (apixaban): http://www.eliquis.com/eliquis/home

## 2014-09-24 NOTE — Consult Note (Signed)
Name: Kaitlyn [name] MRN: [id number] DOB: [birth year]    ADMISSION DATE:  09/24/2014 CONSULTATION DATE:  09/24/2014  REFERRING MD :  Allena Katz  CHIEF COMPLAINT:  SOB  BRIEF PATIENT DESCRIPTION: 64 y.o. F with hx A.fib brought to [facility] ED 5/10 with SOB.  CT chest revealed mediastinal and B hilar LAN.  PCCM consulted for further recs.  SIGNIFICANT EVENTS  5/10 - admit.  PCCM consulted for lymphadenopathy  STUDIES:  CT Chest 5/10 >>> neg for PE, small b/l pleural effusions with b/l atx.  Mediastinal and bilateral hilar LAN causing extrinsic compression of mainstem and lower lobe bronchi.  Airways remain patent, consider lymphoma or prominent reactive lymphadenopathy.  Large right thyroid gland nodule.   HISTORY OF PRESENT ILLNESS:  Kaitlyn [name] is a 64 y.o. F with PMH as outlined below.  She presented to [facility] ED 09/24/14 with SOB x 1 - 2 days.  She has a hx of A.fib and states that everytime she has rapid heart rate, she becomes SOB.  Has experienced this in the past and states that this episode feels exactly the same as it normally does.  She was experiencing palpitations initially followed by SOB.  Symptoms exacerbated by exertion and relieved by rest (also normal for her).  Denied any fevers/chills/sweats, chest pain, cough, wheezing, N/V/D, abdominal pain, myalgias.  No recent travel or exposure to sick contacts.  No hx of clotting or clotting disorders. Has taken all of her medications as prescribed with the exception of Eliquis which she states was not refilled 2 months ago by her physician (toprol and digoxin were which she has been taking).  Although, per [facility], pt has had compliance issues in the past with both medications and outpatient follow up appointments.  In ED, CT of the chest was obtained to rule out PE.  Scan was negative for PE but did reveal mediastinal and bilateral hilar lymphadenopathy with lymph nodes apparently causing extrinsic compression of mainstem and lower lobe bronchi, with  airways remaining patent.  She denies any hx of tobacco use or smoking any illicit drugs.  Minimal second hand smoke exposure.  PCCM was consulted for further recs.   PAST MEDICAL HISTORY :   has a past medical history of Chronic systolic CHF (congestive heart failure); Persistent atrial fibrillation; Seizures [id number]); Complication of anesthesia; Hypertension; Type II diabetes mellitus; Arthritis; H/O noncompliance with medical treatment, presenting hazards to health; Rosacea; Aortic insufficiency; and Paroxysmal atrial flutter.  has past surgical history that includes Wrist fracture surgery (Right, 1978); Knee arthroscopy (Left, 1966); Tonsillectomy (~ 1954); Fracture surgery; Wrist hardware removal (Right, 1978); and Pericardiocentesis (2012). Prior to Admission medications   Medication Sig Start Date End Date Taking? Authorizing Provider  digoxin (LANOXIN) 0.125 MG tablet Take 1 tablet (0.125 mg total) by mouth daily. 09/16/14  Yes Tatyana Kirichenko, PA-C  metoprolol succinate (TOPROL-XL) 100 MG 24 hr tablet Take 1 tablet (100 mg total) by mouth 2 (two) times daily. Take with or immediately following a meal. 09/16/14  Yes Tatyana Kirichenko, PA-C  torsemide (DEMADEX) 20 MG tablet Take 2 tablets (40 mg total) by mouth daily. Patient taking differently: Take 20 mg by mouth daily.  09/16/14  Yes Tatyana Kirichenko, PA-C  apixaban (ELIQUIS) 5 MG TABS tablet Take 1 tablet (5 mg total) by mouth 2 (two) times daily. Patient not taking: Reported on 09/24/2014 09/16/14   Tatyana Kirichenko, PA-C  docusate sodium (COLACE) 100 MG capsule Take 1 capsule (100 mg total) by mouth every 12 (twelve) hours. Patient  not taking: Reported on 09/15/2014 09/12/14   Elpidio Anis, PA-C  oseltamivir (TAMIFLU) 75 MG capsule Take 1 capsule (75 mg total) by mouth 2 (two) times daily. Patient not taking: Reported on 09/10/2014 08/06/14   Kathlen Mody, MD  polyethylene glycol [facility]) packet Take 17 g by mouth 3 (three) times  daily. Until bowels move Patient not taking: Reported on 09/15/2014 09/12/14   Elpidio Anis, PA-C  potassium chloride SA (K-DUR,KLOR-CON) 20 MEQ tablet Take 1 tablet (20 mEq total) by mouth 2 (two) times daily. Patient not taking: Reported on 09/11/2014 08/01/14   Drema Dallas, MD   Allergies  Allergen Reactions  . Caffeine Other (See Comments)    Seizure  . Penicillins Other (See Comments)    Unknown childhood allergy    FAMILY HISTORY:  family history includes Alzheimer's disease in her mother; Diabetes Mellitus II in her mother; Lung cancer in her maternal uncle, maternal uncle, and maternal uncle; Pancreatic cancer in her father. SOCIAL HISTORY:  reports that she has never smoked. She has never used smokeless tobacco. She reports that she does not drink alcohol or use illicit drugs.  REVIEW OF SYSTEMS:   All negative; except for those that are bolded, which indicate positives.  Constitutional: weight loss, weight gain, night sweats, fevers, chills, fatigue, weakness.  HEENT: headaches, sore throat, sneezing, nasal congestion, post nasal drip, difficulty swallowing, tooth/dental problems, visual complaints, visual changes, ear aches. Neuro: difficulty with speech, weakness, numbness, ataxia. CV:  chest pain, orthopnea, PND, swelling in lower extremities, dizziness, palpitations, syncope.  Resp: cough, hemoptysis, dyspnea, wheezing. GI  heartburn, indigestion, abdominal pain, nausea, vomiting, diarrhea, constipation, change in bowel habits, loss of appetite, hematemesis, melena, hematochezia.  GU: dysuria, change in color of urine, urgency or frequency, flank pain, hematuria. MSK: joint pain or swelling, decreased range of motion. Psych: change in mood or affect, depression, anxiety, suicidal ideations, homicidal ideations. Skin: rash, itching, bruising.   SUBJECTIVE: SOB mainly when she feels heart racing.  Relieved with rest or when heart rate feels normal.  Denies chest pain,  cough, fevers/chills/sweats.  VITAL SIGNS: Temp:  [98.2 F (36.8 C)] 98.2 F (36.8 C) (05/10 0500) Pulse Rate:  [87-108] 104 (05/10 0901) Resp:  [16-26] 24 (05/10 0500) BP: (113-148)/(80-102) 139/102 mmHg (05/10 0904) SpO2:  [91 %-96 %] 94 % (05/10 0500) Weight:  [126.554 kg (279 lb)-127.007 kg (280 lb)] 127.007 kg (280 lb) (05/10 0500)  PHYSICAL EXAMINATION: General: Obese female, resting in bed, in NAD. Neuro: A&O x 3, non-focal.  HEENT: [place]/AT. PERRL, sclerae anicteric. Cardiovascular: IRIR, no M/R/G.  Lungs: Respirations even and unlabored.  CTA bilaterally, No W/R/R. Abdomen: Obese, BS x 4, soft, NT/ND.  Musculoskeletal: No gross deformities, no edema.  Skin: Intact, warm, no rashes.    Recent Labs Lab 09/19/14 1344 09/24/14 0056 09/24/14 0704  NA 138 138 136  K 4.4 4.2 4.6  CL 107 104 103  CO2 20*  --  23  BUN 15 13 11   CREATININE 1.01* 0.80 0.98  GLUCOSE 252* 300* 298*    Recent Labs Lab 09/19/14 1245 09/24/14 0042 09/24/14 0056 09/24/14 0704  HGB 12.4 11.7* 12.6 12.0  HCT 39.0 36.7 37.0 37.7  WBC 11.7* 11.4*  --  9.9  PLT 220 212  --  230   Ct Angio Chest Pe W/cm &/or Wo Cm  09/24/2014   CLINICAL DATA:  Intermittent shortness of breath since yesterday. History of atrial fibrillation.  EXAM: CT ANGIOGRAPHY CHEST WITH CONTRAST  TECHNIQUE: Multidetector  CT imaging of the chest was performed using the standard protocol during bolus administration of intravenous contrast. Multiplanar CT image reconstructions and MIPs were obtained to evaluate the vascular anatomy.  CONTRAST:  OMNIPAQUE IOHEXOL 350 MG/ML SOLN  COMPARISON:  Chest radiograph 09/19/2014  FINDINGS: Technically adequate study with good opacification of the central and segmental pulmonary arteries. No focal filling defects are demonstrated. No evidence of significant pulmonary embolus.  Mild cardiac enlargement. Normal caliber thoracic aorta. Esophagus is decompressed. Coronary artery  calcifications. There is lymphadenopathy diffusely throughout the mediastinum and in both hilar regions. Lymph nodes are prominent in number by the individually are only minimally enlarged. Largest lymph nodes measure up to about 15 mm short axis dimension. There appears to be sound extrinsic compression of the mainstem and segmental bronchi from hilar lymph nodes. Airways are patent without evidence of complete occlusion. Diffusely enlarged thyroid gland with heterogeneous right thyroid nodule containing calcification and measuring up to about 3.8 cm diameter. Suggest ultrasound for further evaluation.  Small bilateral pleural effusions with basilar atelectasis. Evaluation of the lungs is limited due to respiratory motion artifact but no gross consolidation is identified. No pneumothorax.  Included portions of the upper abdominal organs are grossly unremarkable. Degenerative changes in the spine. No destructive bone lesions appreciated.  Review of the MIP images confirms the above findings.  IMPRESSION: No evidence of significant pulmonary embolus.  Small bilateral pleural effusions with basilar atelectasis. Mediastinal and bilateral hilar lymphadenopathy with lymph nodes apparently causing extrinsic compression of mainstem and lower lobe bronchi. Airways remain patent. Consider lymphoma or prominent reactive lymphadenopathy. Large right thyroid gland nodule. Ultrasound suggested.   Electronically Signed   By: Burman Nieves M.D.   On: 09/24/2014 02:19    ASSESSMENT / PLAN:  Dyspnea - no evidence of infectious etiology or bronchospasm.  It is almost certainly related to her A.fib with RVR (and pt reports this is normal for her whenever she gets into RVR).   A.fib with intermittent RVR - chronic Plan: Optimize rate control for A.fib. Resume outpatient Eliquis. Stressed importance of ensuring compliance with outpatient medication regimen and outpatient follow up appointments.  Mediastinal and Bilateral  Hilar Lymphadenopathy - negative smoking hx; likely reactive (reportedly had flu in March/April) Plan: Repeat non-contrast CT scan of the chest in 3 months (office will call her to schedule scan). Follow up as outpatient with Dr. Vassie Loll after repeat CT scan done (office will call her to schedule an appt).   PCCM will sign off.  Please do not hesitate to call us back if we can be of any further assistance.   Rutherford Guys, [place] [facility] [place] [facility] Pager: [phone]  or [phone] 09/24/2014, 10:31 AM
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
## 2014-09-24 NOTE — ED Notes (Signed)
Charge Nurse informed appt time @0500 .

## 2014-09-24 NOTE — ED Notes (Signed)
Per EMS, woke up at 0430 yesterday morning with fluttering in chest with any exertion. She has history of afib, and has taken all meds as prescribed. Pulse between 70-115, cbg 333, of fluid though 18g in left hand. bp 134/86. Patient is from home.
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
## 2014-09-24 NOTE — Progress Notes (Signed)
CSW (Clinical Child psychotherapist) confirmed pt address and arrange non-emergent ambulance transport home. Pt aware of potential to be billed for transport. CSW signing off.  Maisy Newport, LCSWA [phone]

## 2014-09-24 NOTE — ED Notes (Signed)
Patient back from CT. Placed back on monitor

## 2014-09-25 NOTE — Telephone Encounter (Signed)
We have not seen this patient in the office. She will be a new consult.

## 2014-09-25 NOTE — Telephone Encounter (Signed)
Will hold in my box until we get august schedule.

## 2014-09-25 NOTE — Telephone Encounter (Signed)
............................................   Hi Libby,   I hope you are well.   I need to schedule an outpatient non-contrast CT of the chest in 2 - 3 months (she will have follow up appointment with Dr. Vassie Loll in the middle of August so scan will need to be prior to that).   Pt's name is Kaitlyn [name], DOB 07-05-1950, MRN#: [id number].   Your help would be appreciated. Thanks in advance!   Rahul Desai  ------------------------------------------  Order entered per request.

## 2014-09-28 ENCOUNTER — Emergency Department (HOSPITAL_COMMUNITY)
Admission: EM | Admit: 2014-09-28 | Discharge: 2014-09-28 | Disposition: A | Discharge status: Home / Self Care | Payer: Medicaid - Out of State | Attending: Emergency Medicine | Admitting: Emergency Medicine

## 2014-09-28 ENCOUNTER — Emergency Department (HOSPITAL_COMMUNITY): Payer: Medicaid - Out of State

## 2014-09-28 ENCOUNTER — Encounter (HOSPITAL_COMMUNITY): Payer: Self-pay | Admitting: Emergency Medicine

## 2014-09-28 DIAGNOSIS — I4891 Unspecified atrial fibrillation: Secondary | ICD-10-CM | POA: Insufficient documentation

## 2014-09-28 DIAGNOSIS — I4892 Unspecified atrial flutter: Secondary | ICD-10-CM | POA: Insufficient documentation

## 2014-09-28 DIAGNOSIS — Z79899 Other long term (current) drug therapy: Secondary | ICD-10-CM | POA: Insufficient documentation

## 2014-09-28 DIAGNOSIS — Z880 Allergy status to penicillin: Secondary | ICD-10-CM | POA: Insufficient documentation

## 2014-09-28 DIAGNOSIS — Z8739 Personal history of other diseases of the musculoskeletal system and connective tissue: Secondary | ICD-10-CM | POA: Insufficient documentation

## 2014-09-28 DIAGNOSIS — I5022 Chronic systolic (congestive) heart failure: Secondary | ICD-10-CM | POA: Insufficient documentation

## 2014-09-28 DIAGNOSIS — Z8619 Personal history of other infectious and parasitic diseases: Secondary | ICD-10-CM | POA: Insufficient documentation

## 2014-09-28 DIAGNOSIS — I10 Essential (primary) hypertension: Secondary | ICD-10-CM | POA: Insufficient documentation

## 2014-09-28 DIAGNOSIS — R0602 Shortness of breath: Secondary | ICD-10-CM | POA: Insufficient documentation

## 2014-09-28 DIAGNOSIS — E119 Type 2 diabetes mellitus without complications: Secondary | ICD-10-CM | POA: Insufficient documentation

## 2014-09-28 LAB — CBC WITH DIFFERENTIAL/PLATELET
Basophils Absolute: 0.1 10*3/uL (ref 0.0–0.1)
Basophils Relative: 1 % (ref 0–1)
Eosinophils Absolute: 0.6 10*3/uL (ref 0.0–0.7)
Eosinophils Relative: 6 % — ABNORMAL HIGH (ref 0–5)
HCT: 37.5 % (ref 36.0–46.0)
Hemoglobin: 12.1 g/dL (ref 12.0–15.0)
Lymphocytes Relative: 14 % (ref 12–46)
Lymphs Abs: 1.4 10*3/uL (ref 0.7–4.0)
MCH: 33 pg (ref 26.0–34.0)
MCHC: 32.3 g/dL (ref 30.0–36.0)
MCV: 102.2 fL — ABNORMAL HIGH (ref 78.0–100.0)
Monocytes Absolute: 0.6 10*3/uL (ref 0.1–1.0)
Monocytes Relative: 6 % (ref 3–12)
Neutro Abs: 7.4 10*3/uL (ref 1.7–7.7)
Neutrophils Relative %: 73 % (ref 43–77)
Platelets: 220 10*3/uL (ref 150–400)
RBC: 3.67 MIL/uL — ABNORMAL LOW (ref 3.87–5.11)
RDW: 16.5 % — ABNORMAL HIGH (ref 11.5–15.5)
WBC: 10 10*3/uL (ref 4.0–10.5)

## 2014-09-28 LAB — COMPREHENSIVE METABOLIC PANEL
ALT: 20 U/L (ref 14–54)
AST: 18 U/L (ref 15–41)
Albumin: 3.5 g/dL (ref 3.5–5.0)
Alkaline Phosphatase: 49 U/L (ref 38–126)
Anion gap: 11 (ref 5–15)
BUN: 13 mg/dL (ref 6–20)
CO2: 20 mmol/L — ABNORMAL LOW (ref 22–32)
Calcium: 8.6 mg/dL — ABNORMAL LOW (ref 8.9–10.3)
Chloride: 102 mmol/L (ref 101–111)
Creatinine, Ser: 1.06 mg/dL — ABNORMAL HIGH (ref 0.44–1.00)
GFR calc Af Amer: >60 mL/min (ref >60)
GFR calc non Af Amer: 54 mL/min — ABNORMAL LOW (ref >60)
Glucose, Bld: 341 mg/dL — ABNORMAL HIGH (ref 65–99)
Potassium: 4 mmol/L (ref 3.5–5.1)
Sodium: 133 mmol/L — ABNORMAL LOW (ref 135–145)
Total Bilirubin: 1 mg/dL (ref 0.3–1.2)
Total Protein: 7 g/dL (ref 6.5–8.1)

## 2014-09-28 LAB — I-STAT TROPONIN, ED: Troponin i, poc: 0.01 ng/mL (ref 0.00–0.08)

## 2014-09-28 LAB — BRAIN NATRIURETIC PEPTIDE: B Natriuretic Peptide: 1087.8 pg/mL — ABNORMAL HIGH (ref 0.0–100.0)

## 2014-09-28 NOTE — ED Notes (Signed)
[employer] to find out about estimate on how much longer, they gave an estimate of now - 3 hours.

## 2014-09-28 NOTE — ED Notes (Signed)
Pt is from home, pt reports feeling her heart racing on and off for the past 24 hours with associated SOB however it had increased and was not subsiding the past 3-4 hours. EMS reports HR was fluctuating around 20. Pt also c/o SOB, weak, dizzy. EMS adm 20mg  of cardizem. HR 84 and regular.

## 2014-09-28 NOTE — ED Notes (Signed)
Pt yelling and cussing in exam room. Dr. Littie Deeds at bedside. Charge RN also at bedside, pt to be transported home via [facility].

## 2014-09-28 NOTE — Discharge Instructions (Signed)

## 2014-09-28 NOTE — ED Notes (Signed)
Pt currently waiting on [facility] for transport. Vital signs stable. No signs of distress noted. Pt up to use bedside commode without difficulty.
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
## 2014-09-28 NOTE — ED Notes (Signed)
MD at bedside. 

## 2014-09-28 NOTE — ED Notes (Signed)
[facility] at bedside to transport patient home.   

## 2014-09-30 ENCOUNTER — Inpatient Hospital Stay: Payer: Self-pay | Admitting: Family Medicine

## 2014-09-30 NOTE — Telephone Encounter (Signed)
Can this message be closed ?

## 2014-10-09 ENCOUNTER — Inpatient Hospital Stay (HOSPITAL_COMMUNITY)
Admission: EM | Admit: 2014-10-09 | Discharge: 2014-10-11 | DRG: 292 | Disposition: A | Discharge status: Home / Self Care | Payer: Medicaid - Out of State | Attending: Internal Medicine | Admitting: Internal Medicine

## 2014-10-09 DIAGNOSIS — Y92099 Unspecified place in other non-institutional residence as the place of occurrence of the external cause: Secondary | ICD-10-CM

## 2014-10-09 DIAGNOSIS — I481 Persistent atrial fibrillation: Secondary | ICD-10-CM | POA: Diagnosis present

## 2014-10-09 DIAGNOSIS — Z9119 Patient's noncompliance with other medical treatment and regimen: Secondary | ICD-10-CM | POA: Diagnosis present

## 2014-10-09 DIAGNOSIS — E1165 Type 2 diabetes mellitus with hyperglycemia: Secondary | ICD-10-CM | POA: Diagnosis present

## 2014-10-09 DIAGNOSIS — I428 Other cardiomyopathies: Secondary | ICD-10-CM | POA: Diagnosis present

## 2014-10-09 DIAGNOSIS — Z880 Allergy status to penicillin: Secondary | ICD-10-CM

## 2014-10-09 DIAGNOSIS — I351 Nonrheumatic aortic (valve) insufficiency: Secondary | ICD-10-CM | POA: Diagnosis present

## 2014-10-09 DIAGNOSIS — Z820 Family history of epilepsy and other diseases of the nervous system: Secondary | ICD-10-CM

## 2014-10-09 DIAGNOSIS — E119 Type 2 diabetes mellitus without complications: Secondary | ICD-10-CM | POA: Diagnosis present

## 2014-10-09 DIAGNOSIS — I129 Hypertensive chronic kidney disease with stage 1 through stage 4 chronic kidney disease, or unspecified chronic kidney disease: Secondary | ICD-10-CM | POA: Diagnosis present

## 2014-10-09 DIAGNOSIS — I5023 Acute on chronic systolic (congestive) heart failure: Secondary | ICD-10-CM | POA: Diagnosis present

## 2014-10-09 DIAGNOSIS — Z91128 Patient's intentional underdosing of medication regimen for other reason: Secondary | ICD-10-CM | POA: Diagnosis present

## 2014-10-09 DIAGNOSIS — T461X6A Underdosing of calcium-channel blockers, initial encounter: Secondary | ICD-10-CM | POA: Diagnosis present

## 2014-10-09 DIAGNOSIS — R591 Generalized enlarged lymph nodes: Secondary | ICD-10-CM | POA: Diagnosis present

## 2014-10-09 DIAGNOSIS — N183 Chronic kidney disease, stage 3 (moderate): Secondary | ICD-10-CM | POA: Diagnosis present

## 2014-10-09 DIAGNOSIS — Z79899 Other long term (current) drug therapy: Secondary | ICD-10-CM

## 2014-10-09 DIAGNOSIS — I482 Chronic atrial fibrillation, unspecified: Secondary | ICD-10-CM

## 2014-10-09 DIAGNOSIS — T501X6A Underdosing of loop [high-ceiling] diuretics, initial encounter: Secondary | ICD-10-CM | POA: Diagnosis present

## 2014-10-09 DIAGNOSIS — Z9114 Patient's other noncompliance with medication regimen: Secondary | ICD-10-CM | POA: Diagnosis present

## 2014-10-09 DIAGNOSIS — E876 Hypokalemia: Secondary | ICD-10-CM | POA: Diagnosis present

## 2014-10-09 DIAGNOSIS — I429 Cardiomyopathy, unspecified: Secondary | ICD-10-CM

## 2014-10-09 DIAGNOSIS — Z833 Family history of diabetes mellitus: Secondary | ICD-10-CM

## 2014-10-09 DIAGNOSIS — E041 Nontoxic single thyroid nodule: Secondary | ICD-10-CM | POA: Diagnosis present

## 2014-10-09 DIAGNOSIS — R569 Unspecified convulsions: Secondary | ICD-10-CM | POA: Diagnosis present

## 2014-10-09 DIAGNOSIS — Z7902 Long term (current) use of antithrombotics/antiplatelets: Secondary | ICD-10-CM

## 2014-10-09 DIAGNOSIS — G40909 Epilepsy, unspecified, not intractable, without status epilepticus: Secondary | ICD-10-CM | POA: Diagnosis present

## 2014-10-09 DIAGNOSIS — I2510 Atherosclerotic heart disease of native coronary artery without angina pectoris: Secondary | ICD-10-CM | POA: Diagnosis present

## 2014-10-09 NOTE — ED Notes (Signed)
MD at bedside. 
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
## 2014-10-10 ENCOUNTER — Inpatient Hospital Stay (HOSPITAL_COMMUNITY): Payer: Medicaid - Out of State

## 2014-10-10 ENCOUNTER — Emergency Department (HOSPITAL_COMMUNITY): Payer: Medicaid - Out of State

## 2014-10-10 ENCOUNTER — Encounter (HOSPITAL_COMMUNITY): Payer: Self-pay | Admitting: *Deleted

## 2014-10-10 DIAGNOSIS — I509 Heart failure, unspecified: Secondary | ICD-10-CM | POA: Diagnosis not present

## 2014-10-10 DIAGNOSIS — Z9119 Patient's noncompliance with other medical treatment and regimen: Secondary | ICD-10-CM

## 2014-10-10 DIAGNOSIS — E041 Nontoxic single thyroid nodule: Secondary | ICD-10-CM

## 2014-10-10 DIAGNOSIS — I482 Chronic atrial fibrillation: Secondary | ICD-10-CM

## 2014-10-10 DIAGNOSIS — I10 Essential (primary) hypertension: Secondary | ICD-10-CM | POA: Diagnosis not present

## 2014-10-10 DIAGNOSIS — I429 Cardiomyopathy, unspecified: Secondary | ICD-10-CM | POA: Diagnosis not present

## 2014-10-10 DIAGNOSIS — E1165 Type 2 diabetes mellitus with hyperglycemia: Secondary | ICD-10-CM

## 2014-10-10 DIAGNOSIS — R569 Unspecified convulsions: Secondary | ICD-10-CM | POA: Diagnosis present

## 2014-10-10 DIAGNOSIS — E876 Hypokalemia: Secondary | ICD-10-CM | POA: Diagnosis present

## 2014-10-10 DIAGNOSIS — E119 Type 2 diabetes mellitus without complications: Secondary | ICD-10-CM | POA: Diagnosis present

## 2014-10-10 DIAGNOSIS — R591 Generalized enlarged lymph nodes: Secondary | ICD-10-CM

## 2014-10-10 DIAGNOSIS — I5023 Acute on chronic systolic (congestive) heart failure: Principal | ICD-10-CM

## 2014-10-10 DIAGNOSIS — I5022 Chronic systolic (congestive) heart failure: Secondary | ICD-10-CM

## 2014-10-10 LAB — BRAIN NATRIURETIC PEPTIDE: B Natriuretic Peptide: 1288.1 pg/mL — ABNORMAL HIGH (ref 0.0–100.0)

## 2014-10-10 LAB — CBC WITH DIFFERENTIAL/PLATELET
Basophils Absolute: 0.1 10*3/uL (ref 0.0–0.1)
Basophils Absolute: 0.1 10*3/uL (ref 0.0–0.1)
Basophils Relative: 1 % (ref 0–1)
Basophils Relative: 1 % (ref 0–1)
Eosinophils Absolute: 0.4 10*3/uL (ref 0.0–0.7)
Eosinophils Absolute: 0.4 10*3/uL (ref 0.0–0.7)
Eosinophils Relative: 3 % (ref 0–5)
Eosinophils Relative: 4 % (ref 0–5)
HCT: 36.8 % (ref 36.0–46.0)
HCT: 38.4 % (ref 36.0–46.0)
Hemoglobin: 12 g/dL (ref 12.0–15.0)
Hemoglobin: 12.3 g/dL (ref 12.0–15.0)
Lymphocytes Relative: 14 % (ref 12–46)
Lymphocytes Relative: 19 % (ref 12–46)
Lymphs Abs: 1.5 10*3/uL (ref 0.7–4.0)
Lymphs Abs: 1.9 10*3/uL (ref 0.7–4.0)
MCH: 32.6 pg (ref 26.0–34.0)
MCH: 33.3 pg (ref 26.0–34.0)
MCHC: 32 g/dL (ref 30.0–36.0)
MCHC: 32.6 g/dL (ref 30.0–36.0)
MCV: 101.9 fL — ABNORMAL HIGH (ref 78.0–100.0)
MCV: 102.2 fL — ABNORMAL HIGH (ref 78.0–100.0)
Monocytes Absolute: 0.6 10*3/uL (ref 0.1–1.0)
Monocytes Absolute: 0.6 10*3/uL (ref 0.1–1.0)
Monocytes Relative: 5 % (ref 3–12)
Monocytes Relative: 6 % (ref 3–12)
Neutro Abs: 7.2 10*3/uL (ref 1.7–7.7)
Neutro Abs: 8.5 10*3/uL — ABNORMAL HIGH (ref 1.7–7.7)
Neutrophils Relative %: 70 % (ref 43–77)
Neutrophils Relative %: 77 % (ref 43–77)
Platelets: 201 10*3/uL (ref 150–400)
Platelets: 212 10*3/uL (ref 150–400)
RBC: 3.6 MIL/uL — ABNORMAL LOW (ref 3.87–5.11)
RBC: 3.77 MIL/uL — ABNORMAL LOW (ref 3.87–5.11)
RDW: 16.1 % — ABNORMAL HIGH (ref 11.5–15.5)
RDW: 16.2 % — ABNORMAL HIGH (ref 11.5–15.5)
WBC: 10.2 10*3/uL (ref 4.0–10.5)
WBC: 11 10*3/uL — ABNORMAL HIGH (ref 4.0–10.5)

## 2014-10-10 LAB — BASIC METABOLIC PANEL
Anion gap: 10 (ref 5–15)
BUN: 18 mg/dL (ref 6–20)
CO2: 22 mmol/L (ref 22–32)
Calcium: 9 mg/dL (ref 8.9–10.3)
Chloride: 106 mmol/L (ref 101–111)
Creatinine, Ser: 1.08 mg/dL — ABNORMAL HIGH (ref 0.44–1.00)
GFR calc Af Amer: >60 mL/min (ref >60)
GFR calc non Af Amer: 53 mL/min — ABNORMAL LOW (ref >60)
Glucose, Bld: 226 mg/dL — ABNORMAL HIGH (ref 65–99)
Potassium: 4.1 mmol/L (ref 3.5–5.1)
Sodium: 138 mmol/L (ref 135–145)

## 2014-10-10 LAB — COMPREHENSIVE METABOLIC PANEL
ALT: 12 U/L — ABNORMAL LOW (ref 14–54)
AST: 17 U/L (ref 15–41)
Albumin: 3.7 g/dL (ref 3.5–5.0)
Alkaline Phosphatase: 45 U/L (ref 38–126)
Anion gap: 9 (ref 5–15)
BUN: 16 mg/dL (ref 6–20)
CO2: 25 mmol/L (ref 22–32)
Calcium: 9.2 mg/dL (ref 8.9–10.3)
Chloride: 106 mmol/L (ref 101–111)
Creatinine, Ser: 1.04 mg/dL — ABNORMAL HIGH (ref 0.44–1.00)
GFR calc Af Amer: >60 mL/min (ref >60)
GFR calc non Af Amer: 56 mL/min — ABNORMAL LOW (ref >60)
Glucose, Bld: 180 mg/dL — ABNORMAL HIGH (ref 65–99)
Potassium: 3.9 mmol/L (ref 3.5–5.1)
Sodium: 140 mmol/L (ref 135–145)
Total Bilirubin: 1.4 mg/dL — ABNORMAL HIGH (ref 0.3–1.2)
Total Protein: 6.8 g/dL (ref 6.5–8.1)

## 2014-10-10 LAB — GLUCOSE, CAPILLARY
Glucose-Capillary: 155 mg/dL — ABNORMAL HIGH (ref 65–99)
Glucose-Capillary: 189 mg/dL — ABNORMAL HIGH (ref 65–99)
Glucose-Capillary: 204 mg/dL — ABNORMAL HIGH (ref 65–99)
Glucose-Capillary: 149 mg/dL — ABNORMAL HIGH (ref 65–99)

## 2014-10-10 LAB — IRON AND TIBC
Iron: 63 ug/dL (ref 28–170)
Saturation Ratios: 18 % (ref 10.4–31.8)
TIBC: 343 ug/dL (ref 250–450)
UIBC: 280 ug/dL

## 2014-10-10 LAB — LIPID PANEL
Cholesterol: 111 mg/dL (ref 0–200)
HDL: 30 mg/dL — ABNORMAL LOW (ref >40)
LDL Cholesterol: 63 mg/dL (ref 0–99)
Total CHOL/HDL Ratio: 3.7 RATIO
Triglycerides: 91 mg/dL (ref <150)
VLDL: 18 mg/dL (ref 0–40)

## 2014-10-10 LAB — DIGOXIN LEVEL
Digoxin Level: 0.4 ng/mL — ABNORMAL LOW (ref 0.8–2.0)
Digoxin Level: 0.4 ng/mL — ABNORMAL LOW (ref 0.8–2.0)

## 2014-10-10 LAB — RETICULOCYTES
RBC.: 3.75 MIL/uL — ABNORMAL LOW (ref 3.87–5.11)
Retic Count, Absolute: 75 10*3/uL (ref 19.0–186.0)
Retic Ct Pct: 2 % (ref 0.4–3.1)

## 2014-10-10 LAB — POTASSIUM
Potassium: 3.3 mmol/L — ABNORMAL LOW (ref 3.5–5.1)
Potassium: 3.7 mmol/L (ref 3.5–5.1)

## 2014-10-10 LAB — TSH: TSH: 2.938 u[IU]/mL (ref 0.350–4.500)

## 2014-10-10 LAB — TROPONIN I: Troponin I: <0.03 ng/mL (ref <0.031)

## 2014-10-10 LAB — VITAMIN B12: Vitamin B-12: 161 pg/mL — ABNORMAL LOW (ref 180–914)

## 2014-10-10 LAB — MAGNESIUM
Magnesium: 1.7 mg/dL (ref 1.7–2.4)
Magnesium: 1.9 mg/dL (ref 1.7–2.4)

## 2014-10-10 LAB — MRSA PCR SCREENING: MRSA by PCR: NEGATIVE

## 2014-10-10 LAB — FERRITIN: Ferritin: 82 ng/mL (ref 11–307)

## 2014-10-10 LAB — FOLATE: Folate: 9.4 ng/mL (ref >5.9)

## 2014-10-10 MED ORDER — DILTIAZEM HCL 100 MG IV SOLR
5.0000 mg/h | INTRAVENOUS | Status: DC
  Administered 2014-10-10: 10 mg/h via INTRAVENOUS
  Filled 2014-10-10 (×2): qty 100

## 2014-10-10 MED ORDER — POTASSIUM CHLORIDE 10 MEQ/100ML IV SOLN
10.0000 meq | INTRAVENOUS | Status: AC
  Administered 2014-10-10 (×3): 10 meq via INTRAVENOUS
  Filled 2014-10-10 (×3): qty 100

## 2014-10-10 MED ORDER — DIGOXIN 125 MCG PO TABS
0.1250 mg | ORAL_TABLET | Freq: Once | ORAL | Status: AC
  Administered 2014-10-10: 0.125 mg via ORAL
  Filled 2014-10-10: qty 1

## 2014-10-10 MED ORDER — DEXTROSE 5 % IV SOLN
5.0000 mg/h | Freq: Once | INTRAVENOUS | Status: AC
  Administered 2014-10-10: 5 mg/h via INTRAVENOUS

## 2014-10-10 MED ORDER — INSULIN ASPART 100 UNIT/ML ~~LOC~~ SOLN
0.0000 [IU] | Freq: Three times a day (TID) | SUBCUTANEOUS | Status: DC
  Administered 2014-10-10: 2 [IU] via SUBCUTANEOUS
  Administered 2014-10-10: 3 [IU] via SUBCUTANEOUS
  Administered 2014-10-10 – 2014-10-11 (×3): 2 [IU] via SUBCUTANEOUS

## 2014-10-10 MED ORDER — METOPROLOL SUCCINATE ER 100 MG PO TB24
100.0000 mg | ORAL_TABLET | Freq: Two times a day (BID) | ORAL | Status: DC
  Administered 2014-10-10 – 2014-10-11 (×3): 100 mg via ORAL
  Filled 2014-10-10 (×5): qty 1

## 2014-10-10 MED ORDER — ONDANSETRON HCL 4 MG PO TABS: 4.0000 mg | ORAL_TABLET | Freq: Four times a day (QID) | ORAL | Status: DC | PRN

## 2014-10-10 MED ORDER — FUROSEMIDE 40 MG PO TABS: 40.0000 mg | ORAL_TABLET | Freq: Every day | ORAL | Status: DC

## 2014-10-10 MED ORDER — MAGNESIUM SULFATE 2 GM/50ML IV SOLN
2.0000 g | Freq: Once | INTRAVENOUS | Status: AC
  Administered 2014-10-10: 2 g via INTRAVENOUS
  Filled 2014-10-10: qty 50

## 2014-10-10 MED ORDER — SODIUM CHLORIDE 0.9 % IJ SOLN
3.0000 mL | Freq: Two times a day (BID) | INTRAMUSCULAR | Status: DC
  Administered 2014-10-10 – 2014-10-11 (×3): 3 mL via INTRAVENOUS

## 2014-10-10 MED ORDER — POTASSIUM CHLORIDE 10 MEQ/100ML IV SOLN
10.0000 meq | Freq: Once | INTRAVENOUS | Status: AC
  Administered 2014-10-10: 10 meq via INTRAVENOUS
  Filled 2014-10-10: qty 100

## 2014-10-10 MED ORDER — LISINOPRIL 5 MG PO TABS
5.0000 mg | ORAL_TABLET | Freq: Every day | ORAL | Status: DC
  Administered 2014-10-10 – 2014-10-11 (×2): 5 mg via ORAL
  Filled 2014-10-10 (×2): qty 1

## 2014-10-10 MED ORDER — FUROSEMIDE 10 MG/ML IJ SOLN
60.0000 mg | Freq: Four times a day (QID) | INTRAMUSCULAR | Status: AC
  Administered 2014-10-10: 60 mg via INTRAVENOUS
  Filled 2014-10-10 (×2): qty 6

## 2014-10-10 MED ORDER — APIXABAN 5 MG PO TABS
5.0000 mg | ORAL_TABLET | Freq: Two times a day (BID) | ORAL | Status: DC
Start: 2014-10-10 — End: 2014-10-11
  Administered 2014-10-10 – 2014-10-11 (×3): 5 mg via ORAL
  Filled 2014-10-10 (×4): qty 1

## 2014-10-10 MED ORDER — DIGOXIN 125 MCG PO TABS
0.1250 mg | ORAL_TABLET | Freq: Every day | ORAL | Status: DC
  Administered 2014-10-10 – 2014-10-11 (×2): 0.125 mg via ORAL
  Filled 2014-10-10 (×2): qty 1

## 2014-10-10 MED ORDER — ONDANSETRON HCL 4 MG/2ML IJ SOLN: 4.0000 mg | Freq: Four times a day (QID) | INTRAMUSCULAR | Status: DC | PRN

## 2014-10-10 MED ORDER — FUROSEMIDE 40 MG PO TABS
60.0000 mg | ORAL_TABLET | Freq: Two times a day (BID) | ORAL | Status: DC
  Administered 2014-10-10 – 2014-10-11 (×2): 60 mg via ORAL
  Filled 2014-10-10 (×6): qty 1

## 2014-10-10 MED ORDER — FUROSEMIDE 10 MG/ML IJ SOLN
40.0000 mg | Freq: Once | INTRAMUSCULAR | Status: AC
  Administered 2014-10-10: 40 mg via INTRAVENOUS
  Filled 2014-10-10: qty 4

## 2014-10-10 MED ORDER — ACETAMINOPHEN 650 MG RE SUPP: 650.0000 mg | Freq: Four times a day (QID) | RECTAL | Status: DC | PRN

## 2014-10-10 MED ORDER — POTASSIUM CHLORIDE CRYS ER 20 MEQ PO TBCR
20.0000 meq | EXTENDED_RELEASE_TABLET | Freq: Two times a day (BID) | ORAL | Status: DC
  Administered 2014-10-10 – 2014-10-11 (×2): 20 meq via ORAL
  Filled 2014-10-10 (×3): qty 1

## 2014-10-10 MED ORDER — ACETAMINOPHEN 325 MG PO TABS: 650.0000 mg | ORAL_TABLET | Freq: Four times a day (QID) | ORAL | Status: DC | PRN

## 2014-10-10 NOTE — Progress Notes (Signed)
     SUBJECTIVE: Feeling better. Breathing is better. No pain.   BP 119/66 mmHg  Pulse 90  Temp(Src) 98.4 F (36.9 C) (Oral)  Resp 24  Ht 5\' 11"  (1.803 m)  Wt 271 lb 6.2 oz (123.1 kg)  BMI 37.87 kg/m2  SpO2 95%  Intake/Output Summary (Last 24 hours) at 10/10/14 5697 Last data filed at 10/10/14 0600  Gross per 24 hour  Intake     10 ml  Output   1600 ml  Net  -1590 ml    PHYSICAL EXAM General: Well developed, well nourished, in no acute distress. Alert and oriented x 3.  Psych:  Good affect, responds appropriately Neck: No JVD. No masses noted.  Lungs: Clear bilaterally with no wheezes or rhonci noted.  Heart: Irregular with no murmurs noted. Abdomen: Bowel sounds are present. Soft, non-tender.  Extremities: 1+ bilateral lower extremity edema.   LABS: Basic Metabolic Panel:  Recent Labs  94/80/16 0026  NA 138  K 4.1  CL 106  CO2 22  GLUCOSE 226*  BUN 18  CREATININE 1.08*  CALCIUM 9.0   CBC:  Recent Labs  10/10/14 0026 10/10/14 0605  WBC 11.0* 10.2  NEUTROABS 8.5* 7.2  HGB 12.0 12.3  HCT 36.8 38.4  MCV 102.2* 101.9*  PLT 201 212   Cardiac Enzymes:  Recent Labs  10/10/14 0026  TROPONINI <0.03   Current Meds: . apixaban  5 mg Oral BID  . digoxin  0.125 mg Oral Daily  . furosemide  60 mg Intravenous Q6H  . [START ON 10/11/2014] furosemide  40 mg Oral Daily  . insulin aspart  0-9 Units Subcutaneous TID WC  . lisinopril  5 mg Oral Daily  . metoprolol succinate  100 mg Oral BID  . potassium chloride SA  20 mEq Oral BID  . sodium chloride  3 mL Intravenous Q12H    ASSESSMENT AND PLAN: 64 yo female with history of chronic atrial fib, chronic systolic CHF with LVEF=20% 1/16, DM, hypertension, seizure disorder who has frequent ER visits and admissions for volume overload, atrial fibrillation and is admitted 10/09/14 with atrial fib with RVR, volume overload. She has a history of non-compliance.   1. Chronic atrial fibrillation: Rate controlled on  current therapy. Would convert to oral Cardizem today (Cardizem 30 mg Q6 hours). Continue Eliquis. She has been out of this for a month so cannot consider DCCV. Continue digoxin and metoprolol.   2. Acute on chronic systolic CHF: Diuresing well with IV Lasix but pt refusing BID dosing. I have encouraged her to take the Lasix as advised.     Glenard Keesling  5/26/20167:42 AM

## 2014-10-10 NOTE — Progress Notes (Signed)
[place] TEAM 1 - Stepdown/ICU TEAM Progress Note  Kaitlyn [name] ZOX:[id number] DOB: 1950/08/11 DOA: 10/09/2014 PCP: No PCP Per Patient  Admit HPI / Brief Narrative: Kaitlyn [name] is a 64 y.o. female PMHx MEDICAL NONCOMPLIANCE, Systolic heart failure last EF measured was 20% in January 2016, Chronic Atrial fibrillation, diabetes mellitus type 2, HTN, chronic kidney disease stage III, seizure disorder,  Presents to the ER because of shortness of breath and palpitations. Patient states that she missed her digoxin for one day as she ran out of it. In the ER patient was found to be in A. fib with RVR and was started on Cardizem infusion and patient also was given Lasix 40 mg IV following which patient had 1600 mL output. Patient was recently admitted in the hospital for lymphadenopathy and has follow-up with pulmonologist as outpatient. Patient stated she was unable to take her torsemide as it caused itching. Patient also has not taken Apixaban. Patient has known history of being noncompliant with medications. Patient denies any chest pain nausea vomiting abdominal pain diarrhea fever chills productive cough. Cardiologist on call Dr. Tresa Endo has seen patient in consult and patient will be admitted for further management of her CHF.   HPI/Subjective: 5/26 A/O 4, very belligerent with staff (yelling and screaming, refusing medication). States does not weigh herself daily, does not know her dry weight, does not have medication home. After speaking with NCM [place] on her last stay patient was set up to follow-up with [facility] and did not follow-up. Currently negative CP, negative SOB.   Assessment/Plan: Acute on chronic systolic heart failure/cardiomyopathy - last EF measured in January 2016 was 20%  -Received Lasix 40 mg IV in ED --> 1600 mL output. - Cardiology has recommended 2 more doses of Lasix 60 mg IV. (Lasix IV 60 mg twice a day) -Strict in and out's, since admission  -3 L -Daily weight standing on scale -Echocardiogram; worsening CHF secondary to noncompliance?  A. fib with RVR  -Currently rate controlled -Continue digoxin 0.125 mg daily -Digitoxin level low at 0.4 ng/ml -Wean off Cardizem drip -Metoprolol XL 100 mg BID -Continue Eliquis 5 mg BID  Essential hypertension - See A. fib with RVR -Lisinopril 5 mg daily -If patient's blood pressure will tolerate AHA guidelines will dictate spironolactone use secondary to her EF<30%  Diabetes type 2 uncontrolled -Hemoglobin A1c pending -Continue sensitive SSI  Thyroid nodule -TSH within normal limit  Lymphadenopathy -has follow-up with pulmonologist.  Seizures -Patient states her last seizures was 2 decades ago.  Hypokalemia  -Potassium goal> 4 -Potassium IV 40 mEq  Hypomagnesemia -Magnesium goal> 2 -Magnesium IV 2 gm  Noncompliance with treatment -This is patient's 6 visit in May, secondary to noncompliance.    Code Status: FULL Family Communication: no family present at time of exam Disposition Plan: Resolution of fluid overload    Consultants: Dr.Jacob Tresa Endo, (cardiology)    Procedure/Significant Events: 1/28 echocardiogram;- Compared to a prior echo in 03/2014, there is only trivial AI.-LVEF =15- 20% with global hypokinesis.   Culture NA  Antibiotics: NA  DVT prophylaxis: Eliquis  Devices NA   LINES / TUBES:  NA    Continuous Infusions: . diltiazem (CARDIZEM) infusion 5 mg/hr (10/10/14 0722)    Objective: VITAL SIGNS: Temp: 98.4 F (36.9 C) (05/26 0523) Temp Source: Oral (05/26 0523) BP: 119/66 mmHg (05/26 0550) Pulse Rate: 90 (05/26 0400) SPO2; FIO2:   Intake/Output Summary (Last 24 hours) at 10/10/14 0751 Last data filed at 10/10/14 0600  Gross per 24 hour  Intake     10 ml  Output   1600 ml  Net  -1590 ml     Exam: General: A/O 4, NAD, No acute respiratory distress, Eyes: Negative headache, eye pain, double vision, scotomas,  floaters, negative retinal hemorrhage ENT: Negative Runny nose, negative ear pain, negative tinnitus, negative gingival bleeding,  Neck:  Negative scars, masses, torticollis, lymphadenopathy, JVD Lungs: Clear to auscultation bilaterally without wheezes, bibasilar crackles Cardiovascular: Irregular irregular rhythm and rate, negative murmur gallop or rub normal S1 and S2 Abdomen:negative abdominal pain, negative dysphagia, Nontender, nondistended, soft, bowel sounds positive, no rebound, no ascites, no appreciable mass Extremities: No significant cyanosis, clubbing. Bilateral pitting edema 3+ to hips Psychiatric:  Negative depression, negative anxiety, negative fatigue, negative mania, poor understanding of her disease process, personality disorder? Neurologic:  Cranial nerves II through XII intact, tongue/uvula midline, all extremities muscle strength 5/5, sensation intact throughout, negative dysarthria, negative expressive aphasia, negative receptive aphasia.     Data Reviewed: Basic Metabolic Panel:  Recent Labs Lab 10/10/14 0026 10/10/14 0605  NA 138 140  K 4.1 3.9  CL 106 106  CO2 22 25  GLUCOSE 226* 180*  BUN 18 16  CREATININE 1.08* 1.04*  CALCIUM 9.0 9.2   Liver Function Tests:  Recent Labs Lab 10/10/14 0605  AST 17  ALT 12*  ALKPHOS 45  BILITOT 1.4*  PROT 6.8  ALBUMIN 3.7   No results for input(s): LIPASE, AMYLASE in the last 168 hours. No results for input(s): AMMONIA in the last 168 hours. CBC:  Recent Labs Lab 10/10/14 0026 10/10/14 0605  WBC 11.0* 10.2  NEUTROABS 8.5* 7.2  HGB 12.0 12.3  HCT 36.8 38.4  MCV 102.2* 101.9*  PLT 201 212   Cardiac Enzymes:  Recent Labs Lab 10/10/14 0026  TROPONINI <0.03   BNP (last 3 results)  Recent Labs  09/19/14 1245 09/28/14 0456 10/10/14 0026  BNP 1172.5* 1087.8* 1288.1*    ProBNP (last 3 results)  Recent Labs  03/21/14 2126 03/22/14 1312 04/27/14 0128  PROBNP 2946.0* 3181.0* 5351.0*     CBG: No results for input(s): GLUCAP in the last 168 hours.  Recent Results (from the past 240 hour(s))  MRSA PCR Screening     Status: None   Collection Time: 10/10/14  5:31 AM  Result Value Ref Range Status   MRSA by PCR NEGATIVE NEGATIVE Final    Comment:        The GeneXpert MRSA Assay (FDA approved for NASAL specimens only), is one component of a comprehensive MRSA colonization surveillance program. It is not intended to diagnose MRSA infection nor to guide or monitor treatment for MRSA infections.      Studies:  Recent x-ray studies have been reviewed in detail by the Attending Physician  Scheduled Meds:  Scheduled Meds: . apixaban  5 mg Oral BID  . digoxin  0.125 mg Oral Daily  . furosemide  60 mg Intravenous Q6H  . [START ON 10/11/2014] furosemide  40 mg Oral Daily  . insulin aspart  0-9 Units Subcutaneous TID WC  . lisinopril  5 mg Oral Daily  . metoprolol succinate  100 mg Oral BID  . potassium chloride SA  20 mEq Oral BID  . sodium chloride  3 mL Intravenous Q12H    Time spent on care of this patient: 40 mins   Bobbie Virden, Roselind Messier , MD  [facility] Office  [phone] Pager [phone]  On-Call/Text Page:  [email or web address]      password [email or web address]  If 7PM-7AM, please contact night-coverage [email or web address] Password [email or web address] 10/10/2014, 7:51 AM   LOS: 0 days   Care during the described time interval was provided by me .  I have reviewed this patient's available data, including medical history, events of note, physical examination, and all test results as part of my evaluation. I have personally reviewed and interpreted all radiology studies.   Carolyne Littles, MD [phone] Pager

## 2014-10-10 NOTE — ED Notes (Signed)
Patient presents from home by EMS with c/o palpitations and SOB.  Stated Kaitlyn [name] felt like Kaitlyn [name] was running after someone and then was SOB.  EMS reports that Kaitlyn [name] was A fib with RVR on the monitor upon their arrival  They transported her to the truck and when they were getting ready to administer Cardizem noted her to have a rate in the 90's down from 160-170"s  Denies N/V has not missed any of her medications  Approx 4 months ago they took her off the Cardizem (only on for 1 month)
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
## 2014-10-10 NOTE — Progress Notes (Signed)
Nutrition Brief Note  Patient identified on the Malnutrition Screening Tool (MST) Report.  Wt Readings from Last 15 Encounters:  10/10/14 271 lb 6.2 oz (123.1 kg)  09/28/14 280 lb (127.007 kg)  09/24/14 280 lb (127.007 kg)  09/19/14 279 lb (126.554 kg)  09/15/14 279 lb (126.554 kg)  09/09/14 279 lb (126.554 kg)  08/05/14 276 lb 3.2 oz (125.283 kg)  08/01/14 281 lb 1.6 oz (127.506 kg)  07/06/14 275 lb 6.4 oz (124.921 kg)  06/18/14 287 lb (130.182 kg)  05/08/14 288 lb 11.2 oz (130.953 kg)  05/02/14 295 lb (133.811 kg)  04/28/14 295 lb 1.6 oz (133.856 kg)  03/24/14 271 lb 13.2 oz (123.3 kg)    Body mass index is 37.87 kg/(m^2). Patient meets criteria for Obesity Class II based on current BMI.   Current diet order is Heart Healthy/Carbohydrate Modified. Labs and medications reviewed.   No nutrition interventions warranted at this time. If nutrition issues arise, please consult RD.   Maureen Chatters, RD, LDN Pager #: [phone] After-Hours Pager #: [phone]

## 2014-10-10 NOTE — Progress Notes (Signed)
Pt refused lasix due to urgency to void. Nurse explained the importance of lasix. Pt prefer to have foley in. Nurse inserted foley @ 10:30 per MD's order. Pt started complaining of discomfort @11 :00 and wanted foley out STAT. Pt refused PO K+ as well. MD spoke with pt. Pt verbalize understanding.

## 2014-10-10 NOTE — Progress Notes (Signed)
Called to receive report. RN will call back.  M.Foster Simpson, RN
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
## 2014-10-10 NOTE — Progress Notes (Signed)
Echocardiogram 2D Echocardiogram has been performed.  [name] 10/10/2014, 3:40 PM

## 2014-10-10 NOTE — Progress Notes (Signed)
Utilization Review Completed.  

## 2014-10-10 NOTE — Progress Notes (Signed)
Patient refusing to take second dose of lasix this AM. States 'I can't take more then 40mg  of lasix a day.' Will notify AM nurse to report to MD.  M.Piel, RN

## 2014-10-11 DIAGNOSIS — I482 Chronic atrial fibrillation: Secondary | ICD-10-CM

## 2014-10-11 DIAGNOSIS — I429 Cardiomyopathy, unspecified: Secondary | ICD-10-CM | POA: Diagnosis not present

## 2014-10-11 DIAGNOSIS — I5023 Acute on chronic systolic (congestive) heart failure: Secondary | ICD-10-CM | POA: Diagnosis not present

## 2014-10-11 DIAGNOSIS — E1165 Type 2 diabetes mellitus with hyperglycemia: Secondary | ICD-10-CM

## 2014-10-11 LAB — COMPREHENSIVE METABOLIC PANEL
ALT: 13 U/L — ABNORMAL LOW (ref 14–54)
AST: 15 U/L (ref 15–41)
Albumin: 3.5 g/dL (ref 3.5–5.0)
Alkaline Phosphatase: 47 U/L (ref 38–126)
Anion gap: 11 (ref 5–15)
BUN: 13 mg/dL (ref 6–20)
CO2: 28 mmol/L (ref 22–32)
Calcium: 8.7 mg/dL — ABNORMAL LOW (ref 8.9–10.3)
Chloride: 98 mmol/L — ABNORMAL LOW (ref 101–111)
Creatinine, Ser: 1.06 mg/dL — ABNORMAL HIGH (ref 0.44–1.00)
GFR calc Af Amer: >60 mL/min (ref >60)
GFR calc non Af Amer: 54 mL/min — ABNORMAL LOW (ref >60)
Glucose, Bld: 173 mg/dL — ABNORMAL HIGH (ref 65–99)
Potassium: 3.2 mmol/L — ABNORMAL LOW (ref 3.5–5.1)
Sodium: 137 mmol/L (ref 135–145)
Total Bilirubin: 1.5 mg/dL — ABNORMAL HIGH (ref 0.3–1.2)
Total Protein: 6.6 g/dL (ref 6.5–8.1)

## 2014-10-11 LAB — HEMOGLOBIN A1C
Hgb A1c MFr Bld: 8.8 % — ABNORMAL HIGH (ref 4.8–5.6)
Mean Plasma Glucose: 206 mg/dL

## 2014-10-11 LAB — GLUCOSE, CAPILLARY
Glucose-Capillary: 192 mg/dL — ABNORMAL HIGH (ref 65–99)
Glucose-Capillary: 182 mg/dL — ABNORMAL HIGH (ref 65–99)

## 2014-10-11 LAB — MAGNESIUM: Magnesium: 1.9 mg/dL (ref 1.7–2.4)

## 2014-10-11 MED ORDER — LISINOPRIL 5 MG PO TABS: 5.0000 mg | ORAL_TABLET | Freq: Every day | ORAL | Status: DC

## 2014-10-11 MED ORDER — METOPROLOL SUCCINATE ER 100 MG PO TB24: 100.0000 mg | ORAL_TABLET | Freq: Two times a day (BID) | ORAL | Status: DC

## 2014-10-11 MED ORDER — DIGOXIN 125 MCG PO TABS: 0.1250 mg | ORAL_TABLET | Freq: Every day | ORAL | Status: DC

## 2014-10-11 MED ORDER — POTASSIUM CHLORIDE CRYS ER 20 MEQ PO TBCR: 40.0000 meq | EXTENDED_RELEASE_TABLET | Freq: Every day | ORAL | Status: DC

## 2014-10-11 MED ORDER — FUROSEMIDE 40 MG PO TABS: 60.0000 mg | ORAL_TABLET | Freq: Every day | ORAL | Status: DC

## 2014-10-11 MED ORDER — APIXABAN 5 MG PO TABS: 5.0000 mg | ORAL_TABLET | Freq: Two times a day (BID) | ORAL | Status: DC

## 2014-10-11 MED ORDER — GLIPIZIDE 5 MG PO TABS: 5.0000 mg | ORAL_TABLET | Freq: Two times a day (BID) | ORAL | Status: DC

## 2014-10-11 NOTE — Progress Notes (Addendum)
SUBJECTIVE: Breathing better. No pain  BP 124/83 mmHg  Pulse 91  Temp(Src) 97.8 F (36.6 C) (Oral)  Resp 18  Ht  (1.803 m)  Wt 258 lb 12.8 oz (117.391 kg)  BMI 36.11 kg/m2  SpO2 94%  Intake/Output Summary (Last 24 hours) at 10/11/14 1001 Last data filed at 10/11/14 0454  Gross per 24 hour  Intake    960 ml  Output   6300 ml  Net  -5340 ml    PHYSICAL EXAM General: Well developed, well nourished, in no acute distress. Alert and oriented x 3.  Psych:  Good affect, responds appropriately Neck: No JVD. No masses noted.  Lungs: Clear bilaterally with no wheezes or rhonci noted.  Heart: Irreg with no murmurs noted. Abdomen: Bowel sounds are present. Soft, non-tender.  Extremities: No lower extremity edema.   LABS: Basic Metabolic Panel:  Recent Labs  09/81/19 0605  10/10/14 1620 10/11/14 0555  NA 140  --   --  137  K 3.9  < > 3.7 3.2*  CL 106  --   --  98*  CO2 25  --   --  28  GLUCOSE 180*  --   --  173*  BUN 16  --   --  13  CREATININE 1.04*  --   --  1.06*  CALCIUM 9.2  --   --  8.7*  MG  --   < > 1.9 1.9  < > = values in this interval not displayed. CBC:  Recent Labs  10/10/14 0026 10/10/14 0605  WBC 11.0* 10.2  NEUTROABS 8.5* 7.2  HGB 12.0 12.3  HCT 36.8 38.4  MCV 102.2* 101.9*  PLT 201 212   Cardiac Enzymes:  Recent Labs  10/10/14 0026  TROPONINI <0.03   Fasting Lipid Panel:  Recent Labs  10/10/14 1003  CHOL 111  HDL 30*  LDLCALC 63  TRIG 91  CHOLHDL 3.7    Current Meds: . apixaban  5 mg Oral BID  . digoxin  0.125 mg Oral Daily  . furosemide  60 mg Oral BID  . insulin aspart  0-9 Units Subcutaneous TID WC  . lisinopril  5 mg Oral Daily  . metoprolol succinate  100 mg Oral BID  . potassium chloride SA  20 mEq Oral BID  . sodium chloride  3 mL Intravenous Q12H   Echo 10/10/14: Left ventricle: The cavity size was severely dilated. Wall thickness was increased in a pattern of mild LVH. Systolic function was  severely reduced. The estimated ejection fraction was in the range of 20% to 25%. Diffuse hypokinesis. - Mitral valve: There was mild regurgitation. - Left atrium: The atrium was moderately dilated. - Right atrium: The atrium was mildly dilated.  ASSESSMENT AND PLAN: 64 yo female with history of chronic atrial fib, chronic systolic CHF with LVEF=20% 1/16, DM, hypertension, seizure disorder who has frequent ER visits and admissions for volume overload, atrial fibrillation and is admitted 10/09/14 with atrial fib with RVR, volume overload. She has a history of non-compliance.   1. Chronic atrial fibrillation: Rate controlled on current therapy. Continue Eliquis. Continue digoxin and metoprolol.   2. Acute on chronic systolic CHF: Diuresing well with IV Lasix, now on po Lasix. Negative 5 liters last 24 hours. At baseline weight.   3. Non-ischemic cardiomyopathy: LVEF=20-25% by echo 10/10/14. She is non-compliant with meds. Would not consider ICD at this time. She needs to take her meds to help with her on medical  condition.    She need to follow up with Dr. Armanda Magic in our [facility] office in 2 weeks. She may need referral to the CHF clinic to help keep her out of the hospital with recurrent CHF. OK to d/c home.     Verniece Encarnacion  5/27/201610:01 AM

## 2014-10-11 NOTE — Care Management (Addendum)
1130 10-11-14 CM did call the [facility] to see if pt is receptive of assistance. CM will not be setting pt up with PCP appointments at this time. Per Transitional Care Liaisons pt does not want assistance. CM will not be setting up [facility] services at this time. Pt has had HH in the past and has refused for anyone to come in the home. No further needs from CM at this time. Gala Lewandowsky, RN,BSN [phone]

## 2014-10-11 NOTE — Discharge Summary (Signed)
Physician Discharge Summary  Kaitlyn [name] ZOX:[id number] DOB: 02-17-1951 DOA: 10/09/2014  PCP: No PCP Per Patient  Admit date: 10/09/2014 Discharge date: 10/11/2014  Time spent: 50 minutes  Recommendations for Outpatient Follow-up:  1. Will be referred to heart failure clinic due to her noncompliance 2. Case management refused to make appt with [facility] since she has missed all appts made for her in the past  Discharge Condition: stable Diet recommendation: low sodium, heart healthy  Discharge Diagnoses:  Principal Problem:   Acute on chronic systolic CHF (congestive heart failure) Active Problems:   Chronic atrial fibrillation   Cardiomyopathy-EF 15-20% by echo Jan 2016   Diabetes type 2, uncontrolled   Thyroid nodule   Seizures   Hypokalemia   Hypomagnesemia   Noncompliance with treatment   History of present illness:  Kaitlyn [name] is a 64 y.o. female PMHx MEDICAL NONCOMPLIANCE, Systolic heart failure last EF measured was 20% in January 2016, Chronic Atrial fibrillation, diabetes mellitus type 2, HTN, chronic kidney disease stage III, seizure disorder,  Presents to the ER because of shortness of breath and palpitations. Patient states that she missed her digoxin for one day as she ran out of it. In the ER patient was found to be in A. fib with RVR and was started on Cardizem infusion and patient also was given Lasix 40 mg IV following which patient had 1600 mL output. Patient was recently admitted in the hospital for lymphadenopathy and has follow-up with pulmonologist as outpatient. Patient stated she was unable to take her torsemide as it caused itching. Patient also has not taken Apixaban. Patient has known history of being noncompliant with medications. Patient denies any chest pain nausea vomiting abdominal pain diarrhea fever chills productive cough. Cardiologist on call Dr. Tresa Endo has seen patient in consult and patient will be admitted for further  management of her CHF.   Hospital Course:  Acute on chronic systolic heart failure/non-ischemic cardiomyopathy - last EF measured in January 2016 was 20%  - Diuresed with Lasix IV and now 8200 cc in negative balance - Weight dropped from 126 to 117 today - she does not want to take Lasix more than once a day at home  - will transition Lasix to 60 mg daily for now - cont ACE I, Digoxin, Metoprolol - CHF Home health and CHF clinic consult placed to assist in management at home and prevent re-admissions  A. fib with RVR  -Currently rate controlled -Continue digoxin 0.125 mg daily -Metoprolol XL 100 mg BID -Continue Eliquis 5 mg BID  Essential hypertension -Lisinopril 5 mg daily, Toprol 100 mg BID  Diabetes type 2 uncontrolled -Hemoglobin A1c 8.8 - will discharge with Glipizide BID  Thyroid nodule -TSH within normal limit  Lymphadenopathy -has follow-up with pulmonologist.  Seizures -Patient states her last seizures was 2 decades ago.  Hypokalemia  -have replaced today- cont replacement with Lasix at home  Hypomagnesemia - adequately replaced  Noncompliance with treatment -This is patient's 6 visit secondary to noncompliance with following up with an outpt PCP and running out of medications - will refer to CHF clinic and set up with home health -  have changed her pharmacy to a pharmacy that will send medications to her home as she has trouble with transportation- prescriptions will therefore be sent to this pharmacy via [facility] and then delivered to her home- I have given her 3 months of refills    Procedures:  2 D ECHO Left ventricle: The cavity size was  severely dilated. Wall thickness was increased in a pattern of mild LVH. Systolic function was severely reduced. The estimated ejection fraction was in the range of 20% to 25%. Diffuse hypokinesis. - Mitral valve: There was mild regurgitation. - Left atrium: The atrium was moderately dilated. - Right atrium:  The atrium was mildly dilated.  Consultations:  cardiology  Discharge Exam: Filed Weights   10/10/14 0003 10/10/14 0500 10/11/14 0500  Weight: 126.554 kg (279 lb) 123.1 kg (271 lb 6.2 oz) 117.391 kg (258 lb 12.8 oz)   Filed Vitals:   10/11/14 0949  BP: 124/83  Pulse:   Temp:   Resp:     General: AAO x 3, no distress Cardiovascular: RRR, no murmurs  Respiratory: clear to auscultation bilaterally GI: soft, non-tender, non-distended, bowel sound positive  Discharge Instructions You were cared for by a hospitalist during your hospital stay. If you have any questions about your discharge medications or the care you received while you were in the hospital after you are discharged, you can call the unit and asked to speak with the hospitalist on call if the hospitalist that took care of you is not available. Once you are discharged, your primary care physician will handle any further medical issues. Please note that NO REFILLS for any discharge medications will be authorized once you are discharged, as it is imperative that you return to your primary care physician (or establish a relationship with a primary care physician if you do not have one) for your aftercare needs so that they can reassess your need for medications and monitor your lab values.      Discharge Instructions    Diet - low sodium heart healthy    Complete by:  As directed      Increase activity slowly    Complete by:  As directed             Medication List    STOP taking these medications        metFORMIN 500 MG tablet- although she was prescribed this, she was not taking it  Commonly known as:  GLUCOPHAGE     torsemide 20 MG tablet- was not taking   Commonly known as:  DEMADEX      TAKE these medications        apixaban 5 MG Tabs tablet  Commonly known as:  ELIQUIS  Take 1 tablet (5 mg total) by mouth 2 (two) times daily.     digoxin 0.125 MG tablet  Commonly known as:  LANOXIN  Take 1 tablet  (0.125 mg total) by mouth daily.     docusate sodium 100 MG capsule  Commonly known as:  COLACE  Take 1 capsule (100 mg total) by mouth every 12 (twelve) hours.     furosemide 40 MG tablet  Commonly known as:  LASIX  Take 1.5 tablets (60 mg total) by mouth daily.     glipiZIDE 5 MG tablet  Commonly known as:  GLUCOTROL  Take 1 tablet (5 mg total) by mouth 2 (two) times daily before a meal.     lisinopril 5 MG tablet  Commonly known as:  PRINIVIL,ZESTRIL  Take 1 tablet (5 mg total) by mouth daily.     metoprolol succinate 100 MG 24 hr tablet  Commonly known as:  TOPROL-XL  Take 1 tablet (100 mg total) by mouth 2 (two) times daily. Take with or immediately following a meal.     potassium chloride SA 20 MEQ tablet  Commonly known  as:  K-DUR,KLOR-CON  Take 2 tablets (40 mEq total) by mouth daily.       Allergies  Allergen Reactions  . Caffeine Other (See Comments)    Seizure  . Penicillins Other (See Comments)    Unknown childhood allergy   Follow-up Information    Follow up with MCALHANY,CHRISTOPHER, MD In 2 weeks.   Specialty:  Cardiology   Contact information:   [address] [place] [address] [phone]        The results of significant diagnostics from this hospitalization (including imaging, microbiology, ancillary and laboratory) are listed below for reference.    Significant Diagnostic Studies: Dg Chest 2 View  09/28/2014   CLINICAL DATA:  Atrial fibrillation.  EXAM: CHEST  2 VIEW  COMPARISON:  09/19/2014  FINDINGS: Cardiac enlargement with mild pulmonary vascular congestion. No significant edema. Small bilateral pleural effusions. No focal consolidation in the lungs. No pneumothorax. Tortuous aorta.  IMPRESSION: Cardiac enlargement with pulmonary vascular congestion. Small bilateral pleural effusions.   Electronically Signed   By: Burman Nieves M.D.   On: 09/28/2014 06:40   Dg Chest 2 View  09/19/2014   CLINICAL DATA:  64 year old female  with shortness of breath and tachycardia since waking up this morning. Initial encounter.  EXAM: CHEST  2 VIEW  COMPARISON:  09/16/2014 and earlier.  FINDINGS: Stable cardiomegaly and mediastinal contours. Mildly improved lung volumes. Suspect small bilateral pleural effusions. Increased interstitial opacity/vascular congestion over this series of exams, not significantly changed when comparing back to the November 2015. No superimposed pneumothorax or new pulmonary opacity. Visualized tracheal air column is within normal limits. No acute osseous abnormality identified.  IMPRESSION: 1. Chronic cardiomegaly. Small bilateral pleural effusions suspected and might be new. 2. Interstitial edema versus interstitial lung disease appears stable over this series of exams since 2015.   Electronically Signed   By: Odessa Fleming M.D.   On: 09/19/2014 12:44   Ct Angio Chest Pe W/cm &/or Wo Cm  09/24/2014   CLINICAL DATA:  Intermittent shortness of breath since yesterday. History of atrial fibrillation.  EXAM: CT ANGIOGRAPHY CHEST WITH CONTRAST  TECHNIQUE: Multidetector CT imaging of the chest was performed using the standard protocol during bolus administration of intravenous contrast. Multiplanar CT image reconstructions and MIPs were obtained to evaluate the vascular anatomy.  CONTRAST:  OMNIPAQUE IOHEXOL 350 MG/ML SOLN  COMPARISON:  Chest radiograph 09/19/2014  FINDINGS: Technically adequate study with good opacification of the central and segmental pulmonary arteries. No focal filling defects are demonstrated. No evidence of significant pulmonary embolus.  Mild cardiac enlargement. Normal caliber thoracic aorta. Esophagus is decompressed. Coronary artery calcifications. There is lymphadenopathy diffusely throughout the mediastinum and in both hilar regions. Lymph nodes are prominent in number by the individually are only minimally enlarged. Largest lymph nodes measure up to about 15 mm short axis dimension. There appears  to be sound extrinsic compression of the mainstem and segmental bronchi from hilar lymph nodes. Airways are patent without evidence of complete occlusion. Diffusely enlarged thyroid gland with heterogeneous right thyroid nodule containing calcification and measuring up to about 3.8 cm diameter. Suggest ultrasound for further evaluation.  Small bilateral pleural effusions with basilar atelectasis. Evaluation of the lungs is limited due to respiratory motion artifact but no gross consolidation is identified. No pneumothorax.  Included portions of the upper abdominal organs are grossly unremarkable. Degenerative changes in the spine. No destructive bone lesions appreciated.  Review of the MIP images confirms the above findings.  IMPRESSION: No evidence of significant pulmonary embolus.  Small bilateral pleural effusions with basilar atelectasis. Mediastinal and bilateral hilar lymphadenopathy with lymph nodes apparently causing extrinsic compression of mainstem and lower lobe bronchi. Airways remain patent. Consider lymphoma or prominent reactive lymphadenopathy. Large right thyroid gland nodule. Ultrasound suggested.   Electronically Signed   By: Burman Nieves M.D.   On: 09/24/2014 02:19   Dg Chest Portable 1 View  10/10/2014   CLINICAL DATA:  Rapid heart rate. History of atrial fibrillation. Shortness of breath.  EXAM: PORTABLE CHEST - 1 VIEW  COMPARISON:  09/28/2014; 09/19/2014 ; 03/21/2014  FINDINGS: Examination is degraded due to patient body habitus and portable technique.  Grossly unchanged enlarged cardiac silhouette and mediastinal contours. Improved aeration of the lungs with persistent mild pulmonary venous congestion. Unchanged chronic trace bilateral effusions with associated bibasilar opacities, right greater than left. No new focal airspace opacities. No pneumothorax. Unchanged bones.  IMPRESSION: 1. No definite acute cardiopulmonary disease on this AP portable examination. 2. Similar findings of  cardiomegaly, pulmonary venous congestion, chronic trace bilateral pleural effusions and associated bibasilar atelectasis, right greater left.   Electronically Signed   By: Simonne Come M.D.   On: 10/10/2014 01:03   Dg Chest Port 1 View  09/16/2014   CLINICAL DATA:  Shortness of Breath  EXAM: PORTABLE CHEST - 1 VIEW  COMPARISON:  09/15/2014  FINDINGS: Cardiac shadow remains enlarged. Increasing vascular congestion is noted. No definitive interstitial edema is seen. No sizable effusions are noted.  IMPRESSION: Slight increase in vascular congestion when compare with the prior exam.   Electronically Signed   By: Alcide Clever M.D.   On: 09/16/2014 08:33   Dg Chest Portable 1 View  09/15/2014   CLINICAL DATA:  Atrial fibrillation.  Shortness of breath.  EXAM: PORTABLE CHEST - 1 VIEW  COMPARISON:  08/03/2014  FINDINGS: Stable moderate enlargement of the cardiopericardial silhouette with tortuosity of the thoracic aorta. No overt edema.  Equivocal subsegmental atelectasis at the right lung base.  IMPRESSION: 1. Moderate enlargement of the cardiopericardial silhouette, without edema. 2. Minimal right basilar subsegmental atelectasis.   Electronically Signed   By: Gaylyn Rong M.D.   On: 09/15/2014 17:33    Microbiology: Recent Results (from the past 240 hour(s))  MRSA PCR Screening     Status: None   Collection Time: 10/10/14  5:31 AM  Result Value Ref Range Status   MRSA by PCR NEGATIVE NEGATIVE Final    Comment:        The GeneXpert MRSA Assay (FDA approved for NASAL specimens only), is one component of a comprehensive MRSA colonization surveillance program. It is not intended to diagnose MRSA infection nor to guide or monitor treatment for MRSA infections.      Labs: Basic Metabolic Panel:  Recent Labs Lab 10/10/14 0026 10/10/14 0605 10/10/14 1003 10/10/14 1620 10/11/14 0555  NA 138 140  --   --  137  K 4.1 3.9 3.3* 3.7 3.2*  CL 106 106  --   --  98*  CO2 22 25  --   --  28   GLUCOSE 226* 180*  --   --  173*  BUN 18 16  --   --  13  CREATININE 1.08* 1.04*  --   --  1.06*  CALCIUM 9.0 9.2  --   --  8.7*  MG  --   --  1.7 1.9 1.9   Liver Function Tests:  Recent Labs Lab 10/10/14 1610 10/11/14  0555  AST 17 15  ALT 12* 13*  ALKPHOS 45 47  BILITOT 1.4* 1.5*  PROT 6.8 6.6  ALBUMIN 3.7 3.5   No results for input(s): LIPASE, AMYLASE in the last 168 hours. No results for input(s): AMMONIA in the last 168 hours. CBC:  Recent Labs Lab 10/10/14 0026 10/10/14 0605  WBC 11.0* 10.2  NEUTROABS 8.5* 7.2  HGB 12.0 12.3  HCT 36.8 38.4  MCV 102.2* 101.9*  PLT 201 212   Cardiac Enzymes:  Recent Labs Lab 10/10/14 0026  TROPONINI <0.03   BNP: BNP (last 3 results)  Recent Labs  09/19/14 1245 09/28/14 0456 10/10/14 0026  BNP 1172.5* 1087.8* 1288.1*    ProBNP (last 3 results)  Recent Labs  03/21/14 2126 03/22/14 1312 04/27/14 0128  PROBNP 2946.0* 3181.0* 5351.0*    CBG:  Recent Labs Lab 10/10/14 1313 10/10/14 1639 10/10/14 2106 10/11/14 0815 10/11/14 1110  GLUCAP 189* 204* 149* 192* 182*       SignedCalvert Cantor, MD [facility] 10/11/2014, 1:40 PM

## 2014-10-11 NOTE — Care Management Note (Signed)
Case Management Note  Patient Details  Name: Kaitlyn [name] MRN: [id number] Date of Birth: 12/09/1950  Subjective/Objective:         Dx systolic failure           Action/Plan: Appt was arranged by ED CM with [facility] - pt did not keep appt.  Medications were also provided.  Pt has not gone to DSS to change [place] Medicaid to [facility] - states she is sure she will be able to use [place] Medicaid to fill scripts.  Per pt, she lived with her mother who had a reverse mortgage on her house and had to move out when her mother died - chose [facility] because she has cousins in [place].  States she has a problem with transportation - explained that [place] Medicaid includes a transportation benefit and that most medication copays will be $3.00 or less.    Expected Discharge Date:                  Expected Discharge Plan:  Home/Self Care  In-House Referral:     Discharge planning Services  CM Consult  Post Acute Care Choice:    Choice offered to:     DME Arranged:    DME Agency:     HH Arranged:    HH Agency:     Status of Service:  In process, will continue to follow  Medicare Important Message Given:    Date Medicare IM Given:    Medicare IM give by:    Date Additional Medicare IM Given:    Additional Medicare Important Message give by:     If discussed at Long Length of Stay Meetings, dates discussed:    Additional Comments:  Magdalene River, RN 10/11/2014, 7:59 AM

## 2014-10-11 NOTE — Clinical Social Work Note (Signed)
CSW was consulted for transportation needs at time of discharge.  Patient is requesting [facility] transportation.  CSW met with patient and confirmed patient's address of: [address]: [address] [place] [address].  RN states that patient's discharge paperwork is not yet complete and states MD has been made aware of such.  CSW will have [facility] form on patient's chart for RN to call and schedule pick-up once discharge time has been anticipated.  RN updated, aware and agreeable.  [facility] [phone] (form on chart)- RN to arrange  CSW signing off.  Gina , LCSW [phone]  Psychiatric & Orthopedics (5N 1-8) Clinical Social Worker    

## 2014-10-11 NOTE — Hospital Discharge Follow-Up (Signed)
Transitional Care Clinic:  This Case Manager was asked by Jacqlyn Krauss, RN Case Manager to speak with patient about [facility] and services provided at clinic.  Met with patient at bedside and thoroughly discussed need for patient to have a PCP and close medical follow-up and medical management after discharge. Patient indicates she plans to follow-up with the CHF clinic upon discharge, and she wants to just go one place after discharge.  She indicates she did not want multiple appointments. Reiterated importance of having a PCP but patient adamant that she does not want post-discharge follow-up at [facility].  Update provided to Jacqlyn Krauss, RN Case Manager and Carole Binning CHF Nurse Navigator.

## 2014-11-05 ENCOUNTER — Emergency Department (HOSPITAL_COMMUNITY): Payer: Medicaid - Out of State

## 2014-11-05 ENCOUNTER — Encounter (HOSPITAL_COMMUNITY): Payer: Self-pay | Admitting: *Deleted

## 2014-11-05 ENCOUNTER — Inpatient Hospital Stay (HOSPITAL_COMMUNITY)
Admission: EM | Admit: 2014-11-05 | Discharge: 2014-11-07 | DRG: 292 | Disposition: A | Discharge status: Home / Self Care | Payer: Medicaid - Out of State | Attending: Internal Medicine | Admitting: Internal Medicine

## 2014-11-05 DIAGNOSIS — E041 Nontoxic single thyroid nodule: Secondary | ICD-10-CM | POA: Diagnosis present

## 2014-11-05 DIAGNOSIS — I5023 Acute on chronic systolic (congestive) heart failure: Principal | ICD-10-CM | POA: Diagnosis present

## 2014-11-05 DIAGNOSIS — E1165 Type 2 diabetes mellitus with hyperglycemia: Secondary | ICD-10-CM | POA: Diagnosis present

## 2014-11-05 DIAGNOSIS — I482 Chronic atrial fibrillation, unspecified: Secondary | ICD-10-CM

## 2014-11-05 DIAGNOSIS — Z7901 Long term (current) use of anticoagulants: Secondary | ICD-10-CM

## 2014-11-05 DIAGNOSIS — R0602 Shortness of breath: Secondary | ICD-10-CM

## 2014-11-05 DIAGNOSIS — N183 Chronic kidney disease, stage 3 (moderate): Secondary | ICD-10-CM | POA: Diagnosis present

## 2014-11-05 DIAGNOSIS — I509 Heart failure, unspecified: Secondary | ICD-10-CM

## 2014-11-05 DIAGNOSIS — Z79899 Other long term (current) drug therapy: Secondary | ICD-10-CM

## 2014-11-05 DIAGNOSIS — I129 Hypertensive chronic kidney disease with stage 1 through stage 4 chronic kidney disease, or unspecified chronic kidney disease: Secondary | ICD-10-CM | POA: Diagnosis present

## 2014-11-05 DIAGNOSIS — E119 Type 2 diabetes mellitus without complications: Secondary | ICD-10-CM | POA: Diagnosis present

## 2014-11-05 DIAGNOSIS — Z9119 Patient's noncompliance with other medical treatment and regimen: Secondary | ICD-10-CM | POA: Diagnosis not present

## 2014-11-05 DIAGNOSIS — Z9114 Patient's other noncompliance with medication regimen: Secondary | ICD-10-CM | POA: Diagnosis present

## 2014-11-05 DIAGNOSIS — M1990 Unspecified osteoarthritis, unspecified site: Secondary | ICD-10-CM | POA: Diagnosis present

## 2014-11-05 DIAGNOSIS — I481 Persistent atrial fibrillation: Secondary | ICD-10-CM | POA: Diagnosis present

## 2014-11-05 LAB — BASIC METABOLIC PANEL
Anion gap: 8 (ref 5–15)
BUN: 11 mg/dL (ref 6–20)
CO2: 22 mmol/L (ref 22–32)
Calcium: 8.8 mg/dL — ABNORMAL LOW (ref 8.9–10.3)
Chloride: 107 mmol/L (ref 101–111)
Creatinine, Ser: 0.9 mg/dL (ref 0.44–1.00)
GFR calc Af Amer: >60 mL/min (ref >60)
GFR calc non Af Amer: >60 mL/min (ref >60)
Glucose, Bld: 267 mg/dL — ABNORMAL HIGH (ref 65–99)
Potassium: 4.2 mmol/L (ref 3.5–5.1)
Sodium: 137 mmol/L (ref 135–145)

## 2014-11-05 LAB — GLUCOSE, CAPILLARY
Glucose-Capillary: 147 mg/dL — ABNORMAL HIGH (ref 65–99)
Glucose-Capillary: 162 mg/dL — ABNORMAL HIGH (ref 65–99)

## 2014-11-05 LAB — TROPONIN I: Troponin I: <0.03 ng/mL (ref <0.031)

## 2014-11-05 LAB — I-STAT TROPONIN, ED: Troponin i, poc: 0.01 ng/mL (ref 0.00–0.08)

## 2014-11-05 LAB — CBC
HCT: 37.6 % (ref 36.0–46.0)
Hemoglobin: 12.4 g/dL (ref 12.0–15.0)
MCH: 33.3 pg (ref 26.0–34.0)
MCHC: 33 g/dL (ref 30.0–36.0)
MCV: 101.1 fL — ABNORMAL HIGH (ref 78.0–100.0)
Platelets: 205 10*3/uL (ref 150–400)
RBC: 3.72 MIL/uL — ABNORMAL LOW (ref 3.87–5.11)
RDW: 16 % — ABNORMAL HIGH (ref 11.5–15.5)
WBC: 9.9 10*3/uL (ref 4.0–10.5)

## 2014-11-05 LAB — BRAIN NATRIURETIC PEPTIDE: B Natriuretic Peptide: 1268.4 pg/mL — ABNORMAL HIGH (ref 0.0–100.0)

## 2014-11-05 MED ORDER — HEPARIN BOLUS VIA INFUSION
4000.0000 [IU] | Freq: Once | INTRAVENOUS | Status: AC
  Administered 2014-11-05: 4000 [IU] via INTRAVENOUS
  Filled 2014-11-05: qty 4000

## 2014-11-05 MED ORDER — FUROSEMIDE 10 MG/ML IJ SOLN
40.0000 mg | Freq: Two times a day (BID) | INTRAMUSCULAR | Status: DC
  Administered 2014-11-06: 40 mg via INTRAVENOUS
  Filled 2014-11-05 (×2): qty 4

## 2014-11-05 MED ORDER — ONDANSETRON HCL 4 MG PO TABS: 4.0000 mg | ORAL_TABLET | Freq: Four times a day (QID) | ORAL | Status: DC | PRN

## 2014-11-05 MED ORDER — DIGOXIN 125 MCG PO TABS
0.1250 mg | ORAL_TABLET | Freq: Every day | ORAL | Status: DC
  Administered 2014-11-06 – 2014-11-07 (×2): 0.125 mg via ORAL
  Filled 2014-11-05 (×2): qty 1

## 2014-11-05 MED ORDER — FUROSEMIDE 10 MG/ML IJ SOLN
80.0000 mg | Freq: Once | INTRAMUSCULAR | Status: DC
Start: 2014-11-05 — End: 2014-11-05
  Filled 2014-11-05: qty 8

## 2014-11-05 MED ORDER — ACETAMINOPHEN 325 MG PO TABS
650.0000 mg | ORAL_TABLET | Freq: Four times a day (QID) | ORAL | Status: DC | PRN
  Administered 2014-11-06: 650 mg via ORAL
  Filled 2014-11-05: qty 2

## 2014-11-05 MED ORDER — INSULIN ASPART 100 UNIT/ML ~~LOC~~ SOLN
0.0000 [IU] | Freq: Three times a day (TID) | SUBCUTANEOUS | Status: DC
  Administered 2014-11-05: 1 [IU] via SUBCUTANEOUS
  Administered 2014-11-06 (×2): 2 [IU] via SUBCUTANEOUS
  Administered 2014-11-06: 1 [IU] via SUBCUTANEOUS
  Administered 2014-11-07: 2 [IU] via SUBCUTANEOUS

## 2014-11-05 MED ORDER — ONDANSETRON HCL 4 MG/2ML IJ SOLN: 4.0000 mg | Freq: Four times a day (QID) | INTRAMUSCULAR | Status: DC | PRN

## 2014-11-05 MED ORDER — POTASSIUM CHLORIDE CRYS ER 20 MEQ PO TBCR
40.0000 meq | EXTENDED_RELEASE_TABLET | Freq: Every day | ORAL | Status: DC
  Administered 2014-11-05 – 2014-11-06 (×2): 40 meq via ORAL
  Filled 2014-11-05 (×3): qty 2

## 2014-11-05 MED ORDER — METOPROLOL SUCCINATE ER 100 MG PO TB24
100.0000 mg | ORAL_TABLET | Freq: Two times a day (BID) | ORAL | Status: DC
  Administered 2014-11-05 – 2014-11-07 (×4): 100 mg via ORAL
  Filled 2014-11-05 (×6): qty 1

## 2014-11-05 MED ORDER — HEPARIN (PORCINE) IN NACL 100-0.45 UNIT/ML-% IJ SOLN
1350.0000 [IU]/h | INTRAMUSCULAR | Status: DC
  Administered 2014-11-05: 1350 [IU]/h via INTRAVENOUS
  Filled 2014-11-05: qty 250

## 2014-11-05 MED ORDER — LISINOPRIL 5 MG PO TABS
5.0000 mg | ORAL_TABLET | Freq: Every day | ORAL | Status: DC
  Administered 2014-11-06 – 2014-11-07 (×2): 5 mg via ORAL
  Filled 2014-11-05 (×2): qty 1

## 2014-11-05 MED ORDER — ACETAMINOPHEN 650 MG RE SUPP: 650.0000 mg | Freq: Four times a day (QID) | RECTAL | Status: DC | PRN

## 2014-11-05 MED ORDER — SODIUM CHLORIDE 0.9 % IV BOLUS (SEPSIS)
500.0000 mL | Freq: Once | INTRAVENOUS | Status: AC
  Administered 2014-11-05: 500 mL via INTRAVENOUS

## 2014-11-05 MED ORDER — APIXABAN 5 MG PO TABS
5.0000 mg | ORAL_TABLET | Freq: Two times a day (BID) | ORAL | Status: DC
  Administered 2014-11-05 – 2014-11-06 (×3): 5 mg via ORAL
  Filled 2014-11-05 (×4): qty 1

## 2014-11-05 MED ORDER — FUROSEMIDE 10 MG/ML IJ SOLN
40.0000 mg | Freq: Once | INTRAMUSCULAR | Status: AC
  Administered 2014-11-05: 40 mg via INTRAVENOUS

## 2014-11-05 MED ORDER — DILTIAZEM HCL 25 MG/5ML IV SOLN
20.0000 mg | Freq: Once | INTRAVENOUS | Status: AC
  Administered 2014-11-05: 20 mg via INTRAVENOUS
  Filled 2014-11-05: qty 5

## 2014-11-05 NOTE — Progress Notes (Signed)
Patient states she will only take one dose of lasix a day and already had lasix today. Pt requests to take at 6AM instead of 00:45. Pharmacy notified. Pt received dose of lasix in ED today.   Pt in Afib, 90-105 at rest in bed. Pt HR up to 150/160 when ambulating to [facility] and back, pt asymptomatic with increased heart rate. Pt given scheduled Toprol-XL. VSS otherwise. Will continue to monitor. Huel Coventry, RN

## 2014-11-05 NOTE — ED Notes (Signed)
Attempted report 

## 2014-11-05 NOTE — ED Notes (Signed)
MD made aware pt is refusing Lasik, RN Pervis Hocking 3E aware.

## 2014-11-05 NOTE — ED Notes (Signed)
Made pt aware of bed assignment 
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
## 2014-11-05 NOTE — ED Notes (Signed)
Pt presents via [facility] for increased SOB since last night.  Pt was not able to lie flat last night sleeping.  Pt hx AFIB, CHF.  Denies missing lasik.  O2 91-92% RA with EMS, BP-128/88 R-18-20, P-88-104 Afib.  Pt a x 4, NAD.  94% RA on arrival.
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
## 2014-11-05 NOTE — Care Management Note (Signed)
Case Management Note  Patient Details  Name: Kaitlyn [name] MRN: [id number] Date of Birth: [birth year]  Subjective/Objective:     Admitted with CHF              Action/Plan:  CM CONSULT Talked to pt about follow up care. Patient lives alone in apt, use a walker and a cab at times for transportation. Otherwise she does not come out of her home. Grocery is delivered to her home from [employer], she calls someone from her apt to take her trash out. Patient does not like follow up care and does not think that it is necessary for her health. CM informed the patient of the importance of follow up care and because of that there is a lapse in her care which is causing frequent hospitalizations. Patient finally agreed to go to the [facility] program. Shanda Bumps with the [facility] contacted for an apt. CM encouraged patient to apply for Medicaid of [place] for medical insurance/ prescription drug coverage. Long discussion with patient about taking care of herself. CM again talked to the patient of the importance of having a PCP and that the hospital will take care of emergency issues and her PCP will manage her care outside of the hospital. Patient stated several times, " I took myself off of certain medication,". CM informed her that the problem is she is not to take herself off of her medication without a physicians order. Patient recently was discharged from [facility] and received the [facility] fund ( Medication Assistance Through [facility]) and she does not qualify for assistance again. She can only use the MATCH fund once a year. Patient stated that she has all of her medication at home except Eliquis. Patient did not follow up with Eliquis medication assistance program after the 30 day free trail of medication.  CM note from 11/05/2014 Valentina Lucks RN - NCM aware of pt arrival. Pt has refused [facility] assistance. She has been MATCHed within last year and has not changed Medicaid  from [place] to [place] as of this time. NCM will continue to follow until disposition determined. Camellia J. Lucretia Roers, RN, BSN, [employer] [phone]  CM note from 10/11/2014 - H Mayo RN - Appt was arranged by ED CM with [facility] and [facility] - pt did not keep appt. Medications were also provided. Pt has not gone to DSS to change [place] Medicaid to [facility] - states she is sure she will be able to use [place] Medicaid to fill scripts. Per pt, she lived with her mother who had a reverse mortgage on her house and had to move out when her mother died - chose [place] because she has cousins in [place]. States she has a problem with transportation - explained that [place] Medicaid includes a transportation benefit and that most medication copays will be $3.00 or less.   Expected Discharge Date:   11/08/2014               Expected Discharge Plan:  Home/Self Care  In-House Referral:  Financial Counselor  Discharge planning Services  CM Consult  Status of Service:  In process, will continue to follow    Kaitlyn [name] [phone] 11/05/2014, 2:45 PM

## 2014-11-05 NOTE — ED Notes (Signed)
Pt refusing 80mg  of Lasik.  Pt educated and continues to refuse.  MD aware. Verbal order to change to 40mg  Lasik x1.
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
## 2014-11-05 NOTE — ED Notes (Signed)
MD at bedside. 

## 2014-11-05 NOTE — Discharge Planning (Signed)
NCM aware of pt arrival.  Pt has refused [facility] assistance. She has been MATCHed within last year and has not changed Medicaid from [place] to [employer] as of this time. NCM will continue to follow until disposition determined. Zen Cedillos J. Lucretia Roers, RN, BSN, [employer] [phone]

## 2014-11-05 NOTE — Progress Notes (Signed)
Kaitlyn. Kaitlyn [name] is very well known to me from previous admissions.  She has been referred to AHF clinic for follow-up multiple times and does not show up for appointment even after sending a cab to pick her up.  In the past she has admitted non-compliance with her medications and does not take them as prescribed.  Please see my note below from prior admission on 07/31/14.  She did not complete any outpatient follow-up after that hospitalization and as far I can see she has not completed any sort of follow-up outside of the hospital with last missed appt on 09/30/14 at [facility].  I will discuss her case with HF team to determine if there is anything else we can do to avoid frequent readmissions.   Progress Notes by Crissie Figures, RN at 07/31/2014 1:33 PM    Author: Crissie Figures, RN Service: Heart Failure Author Type: Registered Nurse   Filed: 07/31/2014 1:54 PM Note Time: 07/31/2014 1:33 PM Status: Addendum   Editor: Crissie Figures, RN (Registered Nurse)     Related Notes: Original Note by Crissie Figures, RN (Registered Nurse) filed at 07/31/2014 1:52 PM   Expand All Collapse All   I know Kaitlyn [name] well from multiple previous hospital admissions. She fails each time to complete outpatient follow-up--(even with transportation provided to and from appt). She has missed 7 [facility] Clinic Appts either through calling and cancelling or just no-show as well as missing many various other appts since November 2015.  She has been told that she must come in to be seen outpatient in order to effectively treat her outside of the hospital. Each admission she runs out of medications prior to an outpatient appointment and then inevitably comes back into the ER. I am unsure what more to offer her at this time to prevent readmission. She is not a candidate for [facility] as she has out of state Medicaid and no current PCP. She is very distrusting and will not allow "strangers" in her home. She refused the paramedicine program after  her last admission. Myself and Lasandra Beech LCSW [facility] SW) will again attempt to gain her trust enough so that she may follow- up in our clinic post hospital stay. Based on previous interactions and her lack of ability to follow- through I do believe that there may be some underlying psychiatric issues.     Author: Crissie Figures, RN Service: Heart Failure Author Type: Registered Nurse    Filed: 08/06/2014 5:21 PM Note Time: 08/06/2014 5:17 PM Status: Signed   Editor: Crissie Figures, RN (Registered Nurse)     Expand All Collapse All   Kaitlyn [name] has been provided all discharge medications except Eliquis through the HF fund. She has made a promise to come to an appointment in the AHF on Thursday to get assistance with [place] Medicaid application and Eliquis assistance as she does not have a working phone currently. I will send a cab to get her at 10 am on Thursday morning -- she is aware and agreeable.     Telephone Encounter Info    Author Note Status Last Update User Last Update Date/Time   Crissie Figures, RN Signed Crissie Figures, RN 08/08/2014 8:38 AM    Telephone Encounter    Expand All Collapse All   Kaitlyn [name] called to say that she will be unable to keep her appt this am with Lasandra Beech- AHF Clinic SW and myself. She says that she has  been having nausea and vomiting since being discharged as well as a "terrible cough". She says that she was not given Tamiflu --however a prescription was sent to her pharmacy at the time of discharge. She does say that she is taking all of her other HF medications that were provided at discharge. She tells me that she will "try" to keep physician appt on Monday March 28th--yet is not sure at this time if she will be able to do so. I encouraged her to seek medical care if her symptoms worsen. I will call to cancel her transportation for this am.

## 2014-11-06 DIAGNOSIS — I482 Chronic atrial fibrillation: Secondary | ICD-10-CM | POA: Diagnosis not present

## 2014-11-06 DIAGNOSIS — I5023 Acute on chronic systolic (congestive) heart failure: Secondary | ICD-10-CM | POA: Diagnosis not present

## 2014-11-06 LAB — BASIC METABOLIC PANEL
Anion gap: 11 (ref 5–15)
BUN: 12 mg/dL (ref 6–20)
CO2: 22 mmol/L (ref 22–32)
Calcium: 8.6 mg/dL — ABNORMAL LOW (ref 8.9–10.3)
Chloride: 105 mmol/L (ref 101–111)
Creatinine, Ser: 0.93 mg/dL (ref 0.44–1.00)
GFR calc Af Amer: >60 mL/min (ref >60)
GFR calc non Af Amer: >60 mL/min (ref >60)
Glucose, Bld: 175 mg/dL — ABNORMAL HIGH (ref 65–99)
Potassium: 3.6 mmol/L (ref 3.5–5.1)
Sodium: 138 mmol/L (ref 135–145)

## 2014-11-06 LAB — GLUCOSE, CAPILLARY
Glucose-Capillary: 171 mg/dL — ABNORMAL HIGH (ref 65–99)
Glucose-Capillary: 176 mg/dL — ABNORMAL HIGH (ref 65–99)
Glucose-Capillary: 142 mg/dL — ABNORMAL HIGH (ref 65–99)
Glucose-Capillary: 172 mg/dL — ABNORMAL HIGH (ref 65–99)

## 2014-11-06 LAB — DIGOXIN LEVEL: Digoxin Level: 0.4 ng/mL — ABNORMAL LOW (ref 0.8–2.0)

## 2014-11-06 LAB — CBC
HCT: 36.8 % (ref 36.0–46.0)
Hemoglobin: 12 g/dL (ref 12.0–15.0)
MCH: 33.1 pg (ref 26.0–34.0)
MCHC: 32.6 g/dL (ref 30.0–36.0)
MCV: 101.7 fL — ABNORMAL HIGH (ref 78.0–100.0)
Platelets: 175 10*3/uL (ref 150–400)
RBC: 3.62 MIL/uL — ABNORMAL LOW (ref 3.87–5.11)
RDW: 16 % — ABNORMAL HIGH (ref 11.5–15.5)
WBC: 8.5 10*3/uL (ref 4.0–10.5)

## 2014-11-06 MED ORDER — ASPIRIN 325 MG PO TABS
325.0000 mg | ORAL_TABLET | Freq: Every day | ORAL | Status: DC
  Administered 2014-11-07: 325 mg via ORAL
  Filled 2014-11-06: qty 1

## 2014-11-06 MED ORDER — FUROSEMIDE 10 MG/ML IJ SOLN
40.0000 mg | Freq: Two times a day (BID) | INTRAMUSCULAR | Status: DC
  Administered 2014-11-07: 40 mg via INTRAVENOUS
  Filled 2014-11-06 (×2): qty 4

## 2014-11-06 NOTE — Hospital Discharge Follow-Up (Signed)
Transitional Care Clinic:  This Case Manager received communication from Olga Coaster, RN CM that patient has had multiple admissions and may benefit from [facility]. Patient has had 8 inpatient admissions, 7 ED visits in the last 6 months. Patient has [place] Medicaid coverage and no PCP.  Met with patient at bedside to thoroughly discuss the [facility] and the services, medical management the program provides. Patient acknowledges she needs a PCP but declines discharge follow-up at the [facility]. She indicates she wants to establish care with a PCP but does not want the close monitoring and follow-up of the [facility].  [facility] discussed including Pharmacy resources, and patient indicates she would like to establish care at the clinic. Attempted to obtain appointment for patient; however, patient declined because she did not want to be "held to an appointment." She indicates she would like to obtain an appointment on her own.  Provided [facility] and [facility] contact information; patient appreciative of information.  Updated Olga Coaster, RN CM.

## 2014-11-06 NOTE — Progress Notes (Addendum)
[facility] PROGRESS NOTE  Kaitlyn [name] LHT:[id number] DOB: [birth year] DOA: 11/05/2014 PCP: No PCP Per Patient  Assessment/Plan:  Active Problems:   Chronic atrial fibrillation   Medical non-compliance   CHF (congestive heart failure)   Acute on chronic systolic heart failure   Diabetes type 2, uncontrolled  Noncompliance is the biggest issue. Continue IV lasix. Would not be able to afford eliquis and not compliant, so warfarin not prudent. Change to ASA  HPI/Subjective: Feels better. Wants to go home  Objective: Filed Vitals:   11/06/14 1424  BP: 113/74  Pulse: 93  Temp: 98 F (36.7 C)  Resp: 18    Intake/Output Summary (Last 24 hours) at 11/06/14 1650 Last data filed at 11/06/14 1623  Gross per 24 hour  Intake   1200 ml  Output   5600 ml  Net  -4400 ml   Filed Weights   11/05/14 1118 11/05/14 1316 11/06/14 0645  Weight: 123.56 kg (272 lb 6.4 oz) 122.8 kg (270 lb 11.6 oz) 120.294 kg (265 lb 3.2 oz)    Exam:   General:  A and o  Cardiovascular: RRR without MGR  Respiratory: CTA without WRR  Abdomen: S, NT, ND  Ext: 2 plus edema  Basic Metabolic Panel:  Recent Labs Lab 11/05/14 0954 11/06/14 0519  NA 137 138  K 4.2 3.6  CL 107 105  CO2 22 22  GLUCOSE 267* 175*  BUN 11 12  CREATININE 0.90 0.93  CALCIUM 8.8* 8.6*   Liver Function Tests: No results for input(s): AST, ALT, ALKPHOS, BILITOT, PROT, ALBUMIN in the last 168 hours. No results for input(s): LIPASE, AMYLASE in the last 168 hours. No results for input(s): AMMONIA in the last 168 hours. CBC:  Recent Labs Lab 11/05/14 0954 11/06/14 0519  WBC 9.9 8.5  HGB 12.4 12.0  HCT 37.6 36.8  MCV 101.1* 101.7*  PLT 205 175   Cardiac Enzymes:  Recent Labs Lab 11/05/14 1630  TROPONINI <0.03   BNP (last 3 results)  Recent Labs  09/28/14 0456 10/10/14 0026 11/05/14 0954  BNP 1087.8* 1288.1* 1268.4*    ProBNP (last 3 results)  Recent Labs  03/21/14 2126 03/22/14 1312  04/27/14 0128  PROBNP 2946.0* 3181.0* 5351.0*    CBG:  Recent Labs Lab 11/05/14 1605 11/05/14 2110 11/06/14 0555 11/06/14 1216 11/06/14 1621  GLUCAP 147* 162* 171* 176* 142*    No results found for this or any previous visit (from the past 240 hour(s)).   Studies: Dg Chest 2 View  11/05/2014   CLINICAL DATA:  64 year old female with shortness of breath, increasing this morning. Unable to lie flat. Initial encounter.  EXAM: CHEST  2 VIEW  COMPARISON:  10/10/2014 and earlier.  FINDINGS: Stable cardiomegaly and mediastinal contours. Continued small bilateral pleural effusions. Pulmonary vascular congestion again noted. Pulmonary vascularity has not significantly changed over this series of exams. Visualized tracheal air column is within normal limits. No pneumothorax. No other confluent pulmonary opacity. No acute osseous abnormality identified.  IMPRESSION: Stable pulmonary vascular congestion or mild interstitial edema. Cardiomegaly and small pleural effusions.   Electronically Signed   By: Odessa Fleming M.D.   On: 11/05/2014 09:58    Scheduled Meds: . apixaban  5 mg Oral BID  . digoxin  0.125 mg Oral Daily  . furosemide  40 mg Intravenous Q12H  . insulin aspart  0-9 Units Subcutaneous TID WC  . lisinopril  5 mg Oral Daily  . metoprolol succinate  100 mg Oral BID  .  potassium chloride SA  40 mEq Oral Daily   Continuous Infusions:   Time spent: 25 minutes  Kaitlyn [name] L  [facility]  [email or web address], password [facility] 11/06/2014, 4:50 PM  LOS: 1 day

## 2014-11-06 NOTE — Progress Notes (Signed)
Utilization review complete. Zuzu Befort RN CCM Case Mgmt phone [phone] 

## 2014-11-07 DIAGNOSIS — I482 Chronic atrial fibrillation: Secondary | ICD-10-CM | POA: Diagnosis not present

## 2014-11-07 DIAGNOSIS — I5023 Acute on chronic systolic (congestive) heart failure: Secondary | ICD-10-CM | POA: Diagnosis not present

## 2014-11-07 LAB — BASIC METABOLIC PANEL
Anion gap: 12 (ref 5–15)
BUN: 11 mg/dL (ref 6–20)
CO2: 25 mmol/L (ref 22–32)
Calcium: 9.2 mg/dL (ref 8.9–10.3)
Chloride: 103 mmol/L (ref 101–111)
Creatinine, Ser: 0.86 mg/dL (ref 0.44–1.00)
GFR calc Af Amer: >60 mL/min (ref >60)
GFR calc non Af Amer: >60 mL/min (ref >60)
Glucose, Bld: 158 mg/dL — ABNORMAL HIGH (ref 65–99)
Potassium: 3.6 mmol/L (ref 3.5–5.1)
Sodium: 140 mmol/L (ref 135–145)

## 2014-11-07 LAB — CBC
HCT: 38 % (ref 36.0–46.0)
Hemoglobin: 12.4 g/dL (ref 12.0–15.0)
MCH: 33.1 pg (ref 26.0–34.0)
MCHC: 32.6 g/dL (ref 30.0–36.0)
MCV: 101.3 fL — ABNORMAL HIGH (ref 78.0–100.0)
Platelets: 202 10*3/uL (ref 150–400)
RBC: 3.75 MIL/uL — ABNORMAL LOW (ref 3.87–5.11)
RDW: 16.2 % — ABNORMAL HIGH (ref 11.5–15.5)
WBC: 9.3 10*3/uL (ref 4.0–10.5)

## 2014-11-07 LAB — GLUCOSE, CAPILLARY: Glucose-Capillary: 188 mg/dL — ABNORMAL HIGH (ref 65–99)

## 2014-11-07 MED ORDER — DIGOXIN 125 MCG PO TABS: 0.1250 mg | ORAL_TABLET | Freq: Every day | ORAL | Status: DC

## 2014-11-07 MED ORDER — METOPROLOL SUCCINATE ER 100 MG PO TB24: 100.0000 mg | ORAL_TABLET | Freq: Two times a day (BID) | ORAL | Status: DC

## 2014-11-07 MED ORDER — POTASSIUM CHLORIDE CRYS ER 20 MEQ PO TBCR
40.0000 meq | EXTENDED_RELEASE_TABLET | Freq: Three times a day (TID) | ORAL | Status: DC
  Administered 2014-11-07: 40 meq via ORAL
  Filled 2014-11-07: qty 2

## 2014-11-07 MED ORDER — GLIPIZIDE 5 MG PO TABS: 5.0000 mg | ORAL_TABLET | Freq: Every day | ORAL | Status: DC

## 2014-11-07 MED ORDER — ASPIRIN 325 MG PO TABS: 325.0000 mg | ORAL_TABLET | Freq: Every day | ORAL | Status: DC

## 2014-11-07 NOTE — Progress Notes (Addendum)
40 meq KLC ordered by Md. Pt would only take 20 meq for various reasons

## 2014-11-07 NOTE — Discharge Summary (Signed)
Physician Discharge Summary  Kaitlyn [name] GNF:[id number] DOB: 1950/08/31 DOA: 11/05/2014  PCP: No PCP Per Patient  Admit date: 11/05/2014 Discharge date: 11/07/2014  Time spent: greater than 30 minutes  Recommendations for Outpatient Follow-up:  1. Monitor weights  Discharge Diagnoses:  Active Problems:   Chronic atrial fibrillation   Medical non-compliance   CHF (congestive heart failure)   Acute on chronic systolic heart failure   Diabetes type 2, uncontrolled   Discharge Condition: stable  Diet recommendation: diabetic heart healthy  Filed Weights   11/05/14 1316 11/06/14 0645 11/07/14 0511  Weight: 122.8 kg (270 lb 11.6 oz) 120.294 kg (265 lb 3.2 oz) 118.026 kg (260 lb 3.2 oz)    History of present illness:  64 y.o. female with known history of medical noncompliance, chronic systolic CHF with EF of 25%, chronic atrial fibrillation supposed to be on Eliquis, diabetes mellitus, CK D2 presents to the ER because of shortness of breath and palpitations. Patient reports that she has not been able to get her Eliquis filled from the pharmacy due to cost, otherwise reports compliance with rest of her medications. She started experiencing shortness of breath and increased heart rate/palpitations yesterday evening and today and hence presented to the ER. Reports missing yesterday's dose of Lasix. In the ER noted to have elevated BNP, mild interstitial edema on chest x-ray with small pleural effusions, A. fib with controlled rate  Hospital Course:  admittted to telemetry.  Diuresed with IV lasix for several days.  Patient is not a good canidate for warfarin due to noncompliance and unable to afford noac.  Therefore, started on aspirin. By discharge, euvolemic. Encourage compliance with medication and follow up  Procedures:  none  Consultations: none  Discharge Exam: Filed Vitals:   11/07/14 0511  BP: 137/90  Pulse: 95  Temp: 97.6 F (36.4 C)  Resp: 18    General: a and  o Cardiovascular: RRR Respiratory: CTA  Discharge Instructions   Discharge Instructions    Activity as tolerated - No restrictions    Complete by:  As directed      Discharge instructions    Complete by:  As directed   Low salt. 1500 cc fluid restriction          Current Discharge Medication List    START taking these medications   Details  aspirin 325 MG tablet Take 1 tablet (325 mg total) by mouth daily.      CONTINUE these medications which have CHANGED   Details  digoxin (LANOXIN) 0.125 MG tablet Take 1 tablet (0.125 mg total) by mouth daily. Qty: 30 tablet, Refills: 0    glipiZIDE (GLUCOTROL) 5 MG tablet Take 1 tablet (5 mg total) by mouth daily before breakfast. Qty: 30 tablet, Refills: 0    metoprolol succinate (TOPROL-XL) 100 MG 24 hr tablet Take 1 tablet (100 mg total) by mouth 2 (two) times daily. Take with or immediately following a meal. Qty: 60 tablet, Refills: 0      CONTINUE these medications which have NOT CHANGED   Details  furosemide (LASIX) 40 MG tablet Take 1.5 tablets (60 mg total) by mouth daily. Qty: 30 tablet, Refills: 3    lisinopril (PRINIVIL,ZESTRIL) 5 MG tablet Take 1 tablet (5 mg total) by mouth daily. Qty: 30 tablet, Refills: 3    potassium chloride SA (K-DUR,KLOR-CON) 20 MEQ tablet Take 2 tablets (40 mEq total) by mouth daily. Qty: 60 tablet, Refills: 0      STOP taking these medications  oxymetazoline (AFRIN) 0.05 % nasal spray      apixaban (ELIQUIS) 5 MG TABS tablet        Allergies  Allergen Reactions  . Caffeine Other (See Comments)    Seizure  . Penicillins Other (See Comments)    Unknown childhood allergy   Follow-up Information    Follow up with [facility]     In 2 weeks.   Contact information:   [address] [place] [address] [phone]       The results of significant diagnostics from this hospitalization (including imaging, microbiology,  ancillary and laboratory) are listed below for reference.    Significant Diagnostic Studies: Dg Chest 2 View  11/05/2014   CLINICAL DATA:  64 year old female with shortness of breath, increasing this morning. Unable to lie flat. Initial encounter.  EXAM: CHEST  2 VIEW  COMPARISON:  10/10/2014 and earlier.  FINDINGS: Stable cardiomegaly and mediastinal contours. Continued small bilateral pleural effusions. Pulmonary vascular congestion again noted. Pulmonary vascularity has not significantly changed over this series of exams. Visualized tracheal air column is within normal limits. No pneumothorax. No other confluent pulmonary opacity. No acute osseous abnormality identified.  IMPRESSION: Stable pulmonary vascular congestion or mild interstitial edema. Cardiomegaly and small pleural effusions.   Electronically Signed   By: Odessa Fleming M.D.   On: 11/05/2014 09:58   Dg Chest Portable 1 View  10/10/2014   CLINICAL DATA:  Rapid heart rate. History of atrial fibrillation. Shortness of breath.  EXAM: PORTABLE CHEST - 1 VIEW  COMPARISON:  09/28/2014; 09/19/2014 ; 03/21/2014  FINDINGS: Examination is degraded due to patient body habitus and portable technique.  Grossly unchanged enlarged cardiac silhouette and mediastinal contours. Improved aeration of the lungs with persistent mild pulmonary venous congestion. Unchanged chronic trace bilateral effusions with associated bibasilar opacities, right greater than left. No new focal airspace opacities. No pneumothorax. Unchanged bones.  IMPRESSION: 1. No definite acute cardiopulmonary disease on this AP portable examination. 2. Similar findings of cardiomegaly, pulmonary venous congestion, chronic trace bilateral pleural effusions and associated bibasilar atelectasis, right greater left.   Electronically Signed   By: Simonne Come M.D.   On: 10/10/2014 01:03    Microbiology: No results found for this or any previous visit (from the past 240 hour(s)).   Labs: Basic  Metabolic Panel:  Recent Labs Lab 11/05/14 0954 11/06/14 0519 11/07/14 0339  NA 137 138 140  K 4.2 3.6 3.6  CL 107 105 103  CO2 22 22 25   GLUCOSE 267* 175* 158*  BUN 11 12 11   CREATININE 0.90 0.93 0.86  CALCIUM 8.8* 8.6* 9.2   Liver Function Tests: No results for input(s): AST, ALT, ALKPHOS, BILITOT, PROT, ALBUMIN in the last 168 hours. No results for input(s): LIPASE, AMYLASE in the last 168 hours. No results for input(s): AMMONIA in the last 168 hours. CBC:  Recent Labs Lab 11/05/14 0954 11/06/14 0519 11/07/14 0339  WBC 9.9 8.5 9.3  HGB 12.4 12.0 12.4  HCT 37.6 36.8 38.0  MCV 101.1* 101.7* 101.3*  PLT 205 175 202   Cardiac Enzymes:  Recent Labs Lab 11/05/14 1630  TROPONINI <0.03   BNP: BNP (last 3 results)  Recent Labs  09/28/14 0456 10/10/14 0026 11/05/14 0954  BNP 1087.8* 1288.1* 1268.4*    ProBNP (last 3 results)  Recent Labs  03/21/14 2126 03/22/14 1312 04/27/14 0128  PROBNP 2946.0* 3181.0* 5351.0*    CBG:  Recent Labs Lab 11/06/14 0555 11/06/14 1216  11/06/14 1621 11/06/14 2059 11/07/14 0618  GLUCAP 171* 176* 142* 172* 188*       Signed:  Baron Parmelee L  [facility] 11/07/2014, 10:16 AM

## 2014-11-07 NOTE — Discharge Instructions (Addendum)
Information on my medicine - ELIQUIS (apixaban)  This medication education was reviewed with me or my healthcare representative as part of my discharge preparation.  The pharmacist that spoke with me during my hospital stay was:  Lennon Alstrom, [facility]  Why was Eliquis prescribed for you? Eliquis was prescribed for you to reduce the risk of a blood clot forming that can cause a stroke if you have a medical condition called atrial fibrillation (a type of irregular heartbeat).  What do You need to know about Eliquis ? Take your Eliquis TWICE DAILY - one tablet in the morning and one tablet in the evening with or without food. If you have difficulty swallowing the tablet whole please discuss with your pharmacist how to take the medication safely.  Take Eliquis exactly as prescribed by your doctor and DO NOT stop taking Eliquis without talking to the doctor who prescribed the medication.  Stopping may increase your risk of developing a stroke.  Refill your prescription before you run out.  After discharge, you should have regular check-up appointments with your healthcare provider that is prescribing your Eliquis.  In the future your dose may need to be changed if your kidney function or weight changes by a significant amount or as you get older.  What do you do if you miss a dose? If you miss a dose, take it as soon as you remember on the same day and resume taking twice daily.  Do not take more than one dose of ELIQUIS at the same time to make up a missed dose.  Important Safety Information A possible side effect of Eliquis is bleeding. You should call your healthcare provider right away if you experience any of the following: ? Bleeding from an injury or your nose that does not stop. ? Unusual colored urine (red or dark brown) or unusual colored stools (red or black). ? Unusual bruising for unknown reasons. ? A serious fall or if you hit your head (even if there is no bleeding).  Some  medicines may interact with Eliquis and might increase your risk of bleeding or clotting while on Eliquis. To help avoid this, consult your healthcare provider or pharmacist prior to using any new prescription or non-prescription medications, including herbals, vitamins, non-steroidal anti-inflammatory drugs (NSAIDs) and supplements.  This website has more information on Eliquis (apixaban): http://www.eliquis.com/eliquis/home  Atrial Fibrillation Atrial fibrillation is a type of irregular heart rhythm (arrhythmia). During atrial fibrillation, the upper chambers of the heart (atria) quiver continuously in a chaotic pattern. This causes an irregular and often rapid heart rate.  Atrial fibrillation is the result of the heart becoming overloaded with disorganized signals that tell it to beat. These signals are normally released one at a time by a part of the right atrium called the sinoatrial node. They then travel from the atria to the lower chambers of the heart (ventricles), causing the atria and ventricles to contract and pump blood as they pass. In atrial fibrillation, parts of the atria outside of the sinoatrial node also release these signals. This results in two problems. First, the atria receive so many signals that they do not have time to fully contract. Second, the ventricles, which can only receive one signal at a time, beat irregularly and out of rhythm with the atria.  There are three types of atrial fibrillation:   Paroxysmal. Paroxysmal atrial fibrillation starts suddenly and stops on its own within a week.  Persistent. Persistent atrial fibrillation lasts for more than a  week. It may stop on its own or with treatment.  Permanent. Permanent atrial fibrillation does not go away. Episodes of atrial fibrillation may lead to permanent atrial fibrillation. Atrial fibrillation can prevent your heart from pumping blood normally. It increases your risk of stroke and can lead to heart failure.    CAUSES   Heart conditions, including a heart attack, heart failure, coronary artery disease, and heart valve conditions.   Inflammation of the sac that surrounds the heart (pericarditis).  Blockage of an artery in the lungs (pulmonary embolism).  Pneumonia or other infections.  Chronic lung disease.  Thyroid problems, especially if the thyroid is overactive (hyperthyroidism).  Caffeine, excessive alcohol use, and use of some illegal drugs.   Use of some medicines, including certain decongestants and diet pills.  Heart surgery.   Birth defects.  Sometimes, no cause can be found. When this happens, the atrial fibrillation is called lone atrial fibrillation. The risk of complications from atrial fibrillation increases if you have lone atrial fibrillation and you are age 65 years or older. RISK FACTORS  Heart failure.  Coronary artery disease.  Diabetes mellitus.   High blood pressure (hypertension).   Obesity.   Other arrhythmias.   Increased age. SIGNS AND SYMPTOMS   A feeling that your heart is beating rapidly or irregularly.   A feeling of discomfort or pain in your chest.   Shortness of breath.   Sudden light-headedness or weakness.   Getting tired easily when exercising.   Urinating more often than normal (mainly when atrial fibrillation first begins).  In paroxysmal atrial fibrillation, symptoms may start and suddenly stop. DIAGNOSIS  Your health care provider may be able to detect atrial fibrillation when taking your pulse. Your health care provider may have you take a test called an ambulatory electrocardiogram (ECG). An ECG records your heartbeat patterns over a 24-hour period. You may also have other tests, such as:  Transthoracic echocardiogram (TTE). During echocardiography, sound waves are used to evaluate how blood flows through your heart.  Transesophageal echocardiogram (TEE).  Stress test. There is more than one type of stress  test. If a stress test is needed, ask your health care provider about which type is best for you.  Chest X-ray exam.  Blood tests.  Computed tomography (CT). TREATMENT  Treatment may include:  Treating any underlying conditions. For example, if you have an overactive thyroid, treating the condition may correct atrial fibrillation.  Taking medicine. Medicines may be given to control a rapid heart rate or to prevent blood clots, heart failure, or a stroke.  Having a procedure to correct the rhythm of the heart:  Electrical cardioversion. During electrical cardioversion, a controlled, low-energy shock is delivered to the heart through your skin. If you have chest pain, very low blood pressure, or sudden heart failure, this procedure may need to be done as an emergency.  Catheter ablation. During this procedure, heart tissues that send the signals that cause atrial fibrillation are destroyed.  Surgical ablation. During this surgery, thin lines of heart tissue that carry the abnormal signals are destroyed. This procedure can either be an open-heart surgery or a minimally invasive surgery. With the minimally invasive surgery, small cuts are made to access the heart instead of a large opening.  Pulmonary venous isolation. During this surgery, tissue around the veins that carry blood from the lungs (pulmonary veins) is destroyed. This tissue is thought to carry the abnormal signals. HOME CARE INSTRUCTIONS   Take medicines only as directed  by your health care provider. Some medicines can make atrial fibrillation worse or recur.  If blood thinners were prescribed by your health care provider, take them exactly as directed. Too much blood-thinning medicine can cause bleeding. If you take too little, you will not have the needed protection against stroke and other problems.  Perform blood tests at home if directed by your health care provider. Perform blood tests exactly as directed.  Quit smoking  if you smoke.  Do not drink alcohol.  Do not drink caffeinated beverages such as coffee, soda, and some teas. You may drink decaffeinated coffee, soda, or tea.   Maintain a healthy weight.Do not use diet pills unless your health care provider approves. They may make heart problems worse.   Follow diet instructions as directed by your health care provider.  Exercise regularly as directed by your health care provider.  Keep all follow-up visits as directed by your health care provider. This is important. PREVENTION  The following substances can cause atrial fibrillation to recur:   Caffeinated beverages.  Alcohol.  Certain medicines, especially those used for breathing problems.  Certain herbs and herbal medicines, such as those containing ephedra or ginseng.  Illegal drugs, such as cocaine and amphetamines. Sometimes medicines are given to prevent atrial fibrillation from recurring. Proper treatment of any underlying condition is also important in helping prevent recurrence.  SEEK MEDICAL CARE IF:  You notice a change in the rate, rhythm, or strength of your heartbeat.  You suddenly begin urinating more frequently.  You tire more easily when exerting yourself or exercising. SEEK IMMEDIATE MEDICAL CARE IF:   You have chest pain, abdominal pain, sweating, or weakness.  You feel nauseous.  You have shortness of breath.  You suddenly have swollen feet and ankles.  You feel dizzy.  Your face or limbs feel numb or weak.  You have a change in your vision or speech. MAKE SURE YOU:   Understand these instructions.  Will watch your condition.  Will get help right away if you are not doing well or get worse. Document Released: 05/03/2005 Document Revised: 09/17/2013 Document Reviewed: 06/13/2012 [facility] Patient Information 2015 [place], [place]. This information is not intended to replace advice given to you by your health care provider. Make sure you discuss any  questions you have with your health care provider.

## 2014-11-07 NOTE — Progress Notes (Signed)
CSW (Clinical Child psychotherapist) aware pt needing non-emergent ambulance transport home. Pt aware of potential out of pocket cost. CSW provided phone number to pt nurse to call when pt is ready for dc. Pt has no further hospital social work needs. CSW signing off.  Gibbs Naugle, LCSWA [phone]

## 2014-11-13 ENCOUNTER — Inpatient Hospital Stay: Payer: Self-pay | Admitting: Family Medicine

## 2014-11-22 ENCOUNTER — Encounter (HOSPITAL_COMMUNITY): Payer: Self-pay | Admitting: *Deleted

## 2014-11-22 ENCOUNTER — Emergency Department (HOSPITAL_COMMUNITY): Payer: Medicaid - Out of State

## 2014-11-22 ENCOUNTER — Telehealth: Payer: Self-pay | Admitting: Pulmonary Disease

## 2014-11-22 ENCOUNTER — Emergency Department (HOSPITAL_COMMUNITY)
Admission: EM | Admit: 2014-11-22 | Discharge: 2014-11-22 | Disposition: A | Discharge status: Home / Self Care | Payer: Medicaid - Out of State | Attending: Emergency Medicine | Admitting: Emergency Medicine

## 2014-11-22 DIAGNOSIS — Z872 Personal history of diseases of the skin and subcutaneous tissue: Secondary | ICD-10-CM | POA: Insufficient documentation

## 2014-11-22 DIAGNOSIS — I5022 Chronic systolic (congestive) heart failure: Secondary | ICD-10-CM | POA: Insufficient documentation

## 2014-11-22 DIAGNOSIS — Z7982 Long term (current) use of aspirin: Secondary | ICD-10-CM | POA: Insufficient documentation

## 2014-11-22 DIAGNOSIS — I10 Essential (primary) hypertension: Secondary | ICD-10-CM | POA: Insufficient documentation

## 2014-11-22 DIAGNOSIS — Z880 Allergy status to penicillin: Secondary | ICD-10-CM | POA: Insufficient documentation

## 2014-11-22 DIAGNOSIS — E119 Type 2 diabetes mellitus without complications: Secondary | ICD-10-CM | POA: Insufficient documentation

## 2014-11-22 DIAGNOSIS — R0602 Shortness of breath: Secondary | ICD-10-CM

## 2014-11-22 DIAGNOSIS — I481 Persistent atrial fibrillation: Secondary | ICD-10-CM | POA: Insufficient documentation

## 2014-11-22 DIAGNOSIS — R600 Localized edema: Secondary | ICD-10-CM | POA: Insufficient documentation

## 2014-11-22 DIAGNOSIS — I4892 Unspecified atrial flutter: Secondary | ICD-10-CM | POA: Insufficient documentation

## 2014-11-22 DIAGNOSIS — Z9119 Patient's noncompliance with other medical treatment and regimen: Secondary | ICD-10-CM | POA: Insufficient documentation

## 2014-11-22 DIAGNOSIS — Z79899 Other long term (current) drug therapy: Secondary | ICD-10-CM | POA: Insufficient documentation

## 2014-11-22 DIAGNOSIS — M1990 Unspecified osteoarthritis, unspecified site: Secondary | ICD-10-CM | POA: Insufficient documentation

## 2014-11-22 DIAGNOSIS — I4891 Unspecified atrial fibrillation: Secondary | ICD-10-CM

## 2014-11-22 LAB — CBC WITH DIFFERENTIAL/PLATELET
Basophils Absolute: 0.1 10*3/uL (ref 0.0–0.1)
Basophils Relative: 1 % (ref 0–1)
Eosinophils Absolute: 0.3 10*3/uL (ref 0.0–0.7)
Eosinophils Relative: 4 % (ref 0–5)
HCT: 34.6 % — ABNORMAL LOW (ref 36.0–46.0)
Hemoglobin: 11.2 g/dL — ABNORMAL LOW (ref 12.0–15.0)
Lymphocytes Relative: 16 % (ref 12–46)
Lymphs Abs: 1.5 10*3/uL (ref 0.7–4.0)
MCH: 32.8 pg (ref 26.0–34.0)
MCHC: 32.4 g/dL (ref 30.0–36.0)
MCV: 101.5 fL — ABNORMAL HIGH (ref 78.0–100.0)
Monocytes Absolute: 0.5 10*3/uL (ref 0.1–1.0)
Monocytes Relative: 5 % (ref 3–12)
Neutro Abs: 7 10*3/uL (ref 1.7–7.7)
Neutrophils Relative %: 74 % (ref 43–77)
Platelets: 205 10*3/uL (ref 150–400)
RBC: 3.41 MIL/uL — ABNORMAL LOW (ref 3.87–5.11)
RDW: 15.8 % — ABNORMAL HIGH (ref 11.5–15.5)
WBC: 9.4 10*3/uL (ref 4.0–10.5)

## 2014-11-22 LAB — COMPREHENSIVE METABOLIC PANEL
ALT: 12 U/L — ABNORMAL LOW (ref 14–54)
AST: 15 U/L (ref 15–41)
Albumin: 3.3 g/dL — ABNORMAL LOW (ref 3.5–5.0)
Alkaline Phosphatase: 44 U/L (ref 38–126)
Anion gap: 8 (ref 5–15)
BUN: 15 mg/dL (ref 6–20)
CO2: 24 mmol/L (ref 22–32)
Calcium: 8.7 mg/dL — ABNORMAL LOW (ref 8.9–10.3)
Chloride: 105 mmol/L (ref 101–111)
Creatinine, Ser: 1 mg/dL (ref 0.44–1.00)
GFR calc Af Amer: >60 mL/min (ref >60)
GFR calc non Af Amer: 58 mL/min — ABNORMAL LOW (ref >60)
Glucose, Bld: 358 mg/dL — ABNORMAL HIGH (ref 65–99)
Potassium: 4 mmol/L (ref 3.5–5.1)
Sodium: 137 mmol/L (ref 135–145)
Total Bilirubin: 0.9 mg/dL (ref 0.3–1.2)
Total Protein: 6.2 g/dL — ABNORMAL LOW (ref 6.5–8.1)

## 2014-11-22 LAB — I-STAT TROPONIN, ED: Troponin i, poc: 0.01 ng/mL (ref 0.00–0.08)

## 2014-11-22 LAB — BRAIN NATRIURETIC PEPTIDE: B Natriuretic Peptide: 938.8 pg/mL — ABNORMAL HIGH (ref 0.0–100.0)

## 2014-11-22 LAB — DIGOXIN LEVEL: Digoxin Level: 0.6 ng/mL — ABNORMAL LOW (ref 0.8–2.0)

## 2014-11-22 MED ORDER — METOPROLOL SUCCINATE ER 100 MG PO TB24: 100.0000 mg | ORAL_TABLET | Freq: Two times a day (BID) | ORAL | Status: DC

## 2014-11-22 MED ORDER — DIGOXIN 125 MCG PO TABS
0.1250 mg | ORAL_TABLET | Freq: Once | ORAL | Status: AC
  Administered 2014-11-22: 0.125 mg via ORAL
  Filled 2014-11-22: qty 1

## 2014-11-22 MED ORDER — FUROSEMIDE 20 MG PO TABS
40.0000 mg | ORAL_TABLET | Freq: Once | ORAL | Status: DC
  Filled 2014-11-22: qty 2

## 2014-11-22 MED ORDER — LISINOPRIL 10 MG PO TABS
5.0000 mg | ORAL_TABLET | Freq: Once | ORAL | Status: AC
  Administered 2014-11-22: 5 mg via ORAL
  Filled 2014-11-22: qty 1

## 2014-11-22 MED ORDER — METOPROLOL SUCCINATE ER 25 MG PO TB24
100.0000 mg | ORAL_TABLET | Freq: Once | ORAL | Status: AC
  Administered 2014-11-22: 100 mg via ORAL
  Filled 2014-11-22: qty 4

## 2014-11-22 NOTE — Telephone Encounter (Signed)
When I called pt to give her the appt info for her CT to be done in August, 2016.  Pt declined d/t financial reasons.  I have cancelled the CT appt.  I  advised pt to call back to reschedule the test when she is able to have this done.

## 2014-11-22 NOTE — ED Notes (Signed)
Pt is refusing to ambulate at this time.  Pt sts "There's no point, I've never fallen and I just want to rest my legs."  PA made aware.

## 2014-11-22 NOTE — Discharge Instructions (Signed)
Atrial Fibrillation °Atrial fibrillation is a type of irregular heart rhythm (arrhythmia). During atrial fibrillation, the upper chambers of the heart (atria) quiver continuously in a chaotic pattern. This causes an irregular and often rapid heart rate.  °Atrial fibrillation is the result of the heart becoming overloaded with disorganized signals that tell it to beat. These signals are normally released one at a time by a part of the right atrium called the sinoatrial node. They then travel from the atria to the lower chambers of the heart (ventricles), causing the atria and ventricles to contract and pump blood as they pass. In atrial fibrillation, parts of the atria outside of the sinoatrial node also release these signals. This results in two problems. First, the atria receive so many signals that they do not have time to fully contract. Second, the ventricles, which can only receive one signal at a time, beat irregularly and out of rhythm with the atria.  °There are three types of atrial fibrillation:  °· Paroxysmal. Paroxysmal atrial fibrillation starts suddenly and stops on its own within a week. °· Persistent. Persistent atrial fibrillation lasts for more than a week. It may stop on its own or with treatment. °· Permanent. Permanent atrial fibrillation does not go away. Episodes of atrial fibrillation may lead to permanent atrial fibrillation. °Atrial fibrillation can prevent your heart from pumping blood normally. It increases your risk of stroke and can lead to heart failure.  °CAUSES  °· Heart conditions, including a heart attack, heart failure, coronary artery disease, and heart valve conditions.   °· Inflammation of the sac that surrounds the heart (pericarditis). °· Blockage of an artery in the lungs (pulmonary embolism). °· Pneumonia or other infections. °· Chronic lung disease. °· Thyroid problems, especially if the thyroid is overactive (hyperthyroidism). °· Caffeine, excessive alcohol use, and use  of some illegal drugs.   °· Use of some medicines, including certain decongestants and diet pills. °· Heart surgery.   °· Birth defects.   °Sometimes, no cause can be found. When this happens, the atrial fibrillation is called lone atrial fibrillation. The risk of complications from atrial fibrillation increases if you have lone atrial fibrillation and you are age 60 years or older. °RISK FACTORS °· Heart failure. °· Coronary artery disease. °· Diabetes mellitus.   °· High blood pressure (hypertension).   °· Obesity.   °· Other arrhythmias.   °· Increased age. °SIGNS AND SYMPTOMS  °· A feeling that your heart is beating rapidly or irregularly.   °· A feeling of discomfort or pain in your chest.   °· Shortness of breath.   °· Sudden light-headedness or weakness.   °· Getting tired easily when exercising.   °· Urinating more often than normal (mainly when atrial fibrillation first begins).   °In paroxysmal atrial fibrillation, symptoms may start and suddenly stop. °DIAGNOSIS  °Your health care provider may be able to detect atrial fibrillation when taking your pulse. Your health care provider may have you take a test called an ambulatory electrocardiogram (ECG). An ECG records your heartbeat patterns over a 24-hour period. You may also have other tests, such as: °· Transthoracic echocardiogram (TTE). During echocardiography, sound waves are used to evaluate how blood flows through your heart. °· Transesophageal echocardiogram (TEE). °· Stress test. There is more than one type of stress test. If a stress test is needed, ask your health care provider about which type is best for you. °· Chest X-ray exam. °· Blood tests. °· Computed tomography (CT). °TREATMENT  °Treatment may include: °· Treating any underlying conditions. For example, if you   have an overactive thyroid, treating the condition may correct atrial fibrillation.  Taking medicine. Medicines may be given to control a rapid heart rate or to prevent blood  clots, heart failure, or a stroke.  Having a procedure to correct the rhythm of the heart:  Electrical cardioversion. During electrical cardioversion, a controlled, low-energy shock is delivered to the heart through your skin. If you have chest pain, very low blood pressure, or sudden heart failure, this procedure may need to be done as an emergency.  Catheter ablation. During this procedure, heart tissues that send the signals that cause atrial fibrillation are destroyed.  Surgical ablation. During this surgery, thin lines of heart tissue that carry the abnormal signals are destroyed. This procedure can either be an open-heart surgery or a minimally invasive surgery. With the minimally invasive surgery, small cuts are made to access the heart instead of a large opening.  Pulmonary venous isolation. During this surgery, tissue around the veins that carry blood from the lungs (pulmonary veins) is destroyed. This tissue is thought to carry the abnormal signals. HOME CARE INSTRUCTIONS   Take medicines only as directed by your health care provider. Some medicines can make atrial fibrillation worse or recur.  If blood thinners were prescribed by your health care provider, take them exactly as directed. Too much blood-thinning medicine can cause bleeding. If you take too little, you will not have the needed protection against stroke and other problems.  Perform blood tests at home if directed by your health care provider. Perform blood tests exactly as directed.  Quit smoking if you smoke.  Do not drink alcohol.  Do not drink caffeinated beverages such as coffee, soda, and some teas. You may drink decaffeinated coffee, soda, or tea.   Maintain a healthy weight.Do not use diet pills unless your health care provider approves. They may make heart problems worse.   Follow diet instructions as directed by your health care provider.  Exercise regularly as directed by your health care  provider.  Keep all follow-up visits as directed by your health care provider. This is important. PREVENTION  The following substances can cause atrial fibrillation to recur:   Caffeinated beverages.  Alcohol.  Certain medicines, especially those used for breathing problems.  Certain herbs and herbal medicines, such as those containing ephedra or ginseng.  Illegal drugs, such as cocaine and amphetamines. Sometimes medicines are given to prevent atrial fibrillation from recurring. Proper treatment of any underlying condition is also important in helping prevent recurrence.  SEEK MEDICAL CARE IF:  You notice a change in the rate, rhythm, or strength of your heartbeat.  You suddenly begin urinating more frequently.  You tire more easily when exerting yourself or exercising. SEEK IMMEDIATE MEDICAL CARE IF:   You have chest pain, abdominal pain, sweating, or weakness.  You feel nauseous.  You have shortness of breath.  You suddenly have swollen feet and ankles.  You feel dizzy.  Your face or limbs feel numb or weak.  You have a change in your vision or speech. MAKE SURE YOU:   Understand these instructions.  Will watch your condition.  Will get help right away if you are not doing well or get worse. Document Released: 05/03/2005 Document Revised: 09/17/2013 Document Reviewed: 06/13/2012 [facility] Patient Information 2015 [place], [place]. This information is not intended to replace advice given to you by your health care provider. Make sure you discuss any questions you have with your health care provider.  Please follow up with the  wellness center for your medications and cardiology follow up.

## 2014-11-22 NOTE — Progress Notes (Signed)
Received a call from [facility], ED CSW, requesting assistance obtaining medications for patient.  Patient has had multiple admissions within the past 6 months and had received the [facility] program.  Per CM note from previous admission, an attempt was made to connect patient with the [name] to establish care and provide access to medication assistance through their pharmacy.  Chart was reviewed, but no evidence was found of any appointments with the clinic.  CM left a voicemail for [employer], Engineer, manufacturing at [facility] to see if any appointments are available. No patient information was provided via voicemail.  CM will present available options to patient once Harvin Hazel has returned the voicemail.  CSW was updated.  CM awaiting return call from [facility].

## 2014-11-22 NOTE — ED Notes (Signed)
Pt. Has been having SOB for 2 days called EMS this morning HR in the 150s on scene. Pt. Received 10mg  of cards. And dropped HR to 110s. Pt. Started feeling better and received another 10mg  of cards. HR in the 90s. Still in Afib.

## 2014-11-22 NOTE — Progress Notes (Addendum)
LCSW called for consult regarding transportation. Patient requesting PTAR and aware of out of pocket expense. Will arrange transportation for patient when patient is medically cleared for DC.  Patient is not active with [facility] despite the numerous attempts to arrange an appointment for patient she will just not follow up.  That being said, patient cannot get her prescriptions filled or refilled until she completes visit.  Patient also would have transpiration as this has been arranged in the past.    At this time, patient must change her Medicaid over and follow up with appointments as she has been told this for over a year from numbers SWs and RN CMs.    Kaitlyn [name], MSW Clinical Social Work: Emergency Room [phone]
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
## 2014-11-27 ENCOUNTER — Encounter (HOSPITAL_COMMUNITY): Payer: Self-pay | Admitting: *Deleted

## 2014-11-27 ENCOUNTER — Emergency Department (HOSPITAL_COMMUNITY)
Admission: EM | Admit: 2014-11-27 | Discharge: 2014-11-27 | Disposition: A | Discharge status: Home / Self Care | Payer: Self-pay | Attending: Emergency Medicine | Admitting: Emergency Medicine

## 2014-11-27 ENCOUNTER — Emergency Department (HOSPITAL_COMMUNITY): Payer: Medicaid - Out of State

## 2014-11-27 DIAGNOSIS — Z880 Allergy status to penicillin: Secondary | ICD-10-CM | POA: Insufficient documentation

## 2014-11-27 DIAGNOSIS — I509 Heart failure, unspecified: Secondary | ICD-10-CM

## 2014-11-27 DIAGNOSIS — Z79899 Other long term (current) drug therapy: Secondary | ICD-10-CM | POA: Insufficient documentation

## 2014-11-27 DIAGNOSIS — Z9119 Patient's noncompliance with other medical treatment and regimen: Secondary | ICD-10-CM | POA: Insufficient documentation

## 2014-11-27 DIAGNOSIS — Z7982 Long term (current) use of aspirin: Secondary | ICD-10-CM | POA: Insufficient documentation

## 2014-11-27 DIAGNOSIS — I481 Persistent atrial fibrillation: Secondary | ICD-10-CM | POA: Insufficient documentation

## 2014-11-27 DIAGNOSIS — M1990 Unspecified osteoarthritis, unspecified site: Secondary | ICD-10-CM | POA: Insufficient documentation

## 2014-11-27 DIAGNOSIS — Z872 Personal history of diseases of the skin and subcutaneous tissue: Secondary | ICD-10-CM | POA: Insufficient documentation

## 2014-11-27 DIAGNOSIS — I10 Essential (primary) hypertension: Secondary | ICD-10-CM | POA: Insufficient documentation

## 2014-11-27 DIAGNOSIS — I4891 Unspecified atrial fibrillation: Secondary | ICD-10-CM

## 2014-11-27 DIAGNOSIS — I5022 Chronic systolic (congestive) heart failure: Secondary | ICD-10-CM | POA: Insufficient documentation

## 2014-11-27 DIAGNOSIS — E119 Type 2 diabetes mellitus without complications: Secondary | ICD-10-CM | POA: Insufficient documentation

## 2014-11-27 LAB — BASIC METABOLIC PANEL
Anion gap: 7 (ref 5–15)
BUN: 15 mg/dL (ref 6–20)
CO2: 21 mmol/L — ABNORMAL LOW (ref 22–32)
Calcium: 8.9 mg/dL (ref 8.9–10.3)
Chloride: 110 mmol/L (ref 101–111)
Creatinine, Ser: 0.97 mg/dL (ref 0.44–1.00)
GFR calc Af Amer: >60 mL/min (ref >60)
GFR calc non Af Amer: >60 mL/min (ref >60)
Glucose, Bld: 298 mg/dL — ABNORMAL HIGH (ref 65–99)
Potassium: 3.9 mmol/L (ref 3.5–5.1)
Sodium: 138 mmol/L (ref 135–145)

## 2014-11-27 LAB — CBC
HCT: 36.1 % (ref 36.0–46.0)
Hemoglobin: 11.7 g/dL — ABNORMAL LOW (ref 12.0–15.0)
MCH: 32.9 pg (ref 26.0–34.0)
MCHC: 32.4 g/dL (ref 30.0–36.0)
MCV: 101.4 fL — ABNORMAL HIGH (ref 78.0–100.0)
Platelets: 209 10*3/uL (ref 150–400)
RBC: 3.56 MIL/uL — ABNORMAL LOW (ref 3.87–5.11)
RDW: 15.9 % — ABNORMAL HIGH (ref 11.5–15.5)
WBC: 9.9 10*3/uL (ref 4.0–10.5)

## 2014-11-27 LAB — I-STAT TROPONIN, ED: Troponin i, poc: 0.01 ng/mL (ref 0.00–0.08)

## 2014-11-27 LAB — DIGOXIN LEVEL: Digoxin Level: 0.7 ng/mL — ABNORMAL LOW (ref 0.8–2.0)

## 2014-11-27 MED ORDER — DILTIAZEM HCL 25 MG/5ML IV SOLN
20.0000 mg | Freq: Once | INTRAVENOUS | Status: AC
  Administered 2014-11-27: 20 mg via INTRAVENOUS
  Filled 2014-11-27: qty 5

## 2014-11-27 MED ORDER — DIGOXIN 125 MCG PO TABS
0.2500 mg | ORAL_TABLET | Freq: Once | ORAL | Status: AC
  Administered 2014-11-27: 0.25 mg via ORAL
  Filled 2014-11-27: qty 2

## 2014-11-27 MED ORDER — FUROSEMIDE 10 MG/ML IJ SOLN: 80.0000 mg | Freq: Once | INTRAMUSCULAR | Status: DC

## 2014-11-27 MED ORDER — FUROSEMIDE 10 MG/ML IJ SOLN
40.0000 mg | Freq: Once | INTRAMUSCULAR | Status: AC
  Administered 2014-11-27: 40 mg via INTRAVENOUS
  Filled 2014-11-27: qty 4

## 2014-11-27 MED ORDER — POTASSIUM CHLORIDE CRYS ER 20 MEQ PO TBCR: 60.0000 meq | EXTENDED_RELEASE_TABLET | Freq: Once | ORAL | Status: DC

## 2014-11-27 MED ORDER — POTASSIUM CHLORIDE CRYS ER 20 MEQ PO TBCR
40.0000 meq | EXTENDED_RELEASE_TABLET | Freq: Once | ORAL | Status: AC
  Administered 2014-11-27: 40 meq via ORAL
  Filled 2014-11-27: qty 2

## 2014-11-27 NOTE — ED Notes (Signed)
PTAR called by RN

## 2014-11-27 NOTE — ED Notes (Signed)
Pt requesting to speak with MD in regards to her medication.  Pt requesting cardizem instead of Lasik, RN educated pt on the difference of the medications and how they work.  Pt also refusing 80mg  Lasik, will only take 40mg  Lasik. Will make MD Kohut aware.

## 2014-11-27 NOTE — Discharge Instructions (Signed)
Heart Failure °Heart failure is a condition in which the heart has trouble pumping blood. This means your heart does not pump blood efficiently for your body to work well. In some cases of heart failure, fluid may back up into your lungs or you may have swelling (edema) in your lower legs. Heart failure is usually a long-term (chronic) condition. It is important for you to take good care of yourself and follow your health care provider's treatment plan. °CAUSES  °Some health conditions can cause heart failure. Those health conditions include: °· High blood pressure (hypertension). Hypertension causes the heart muscle to work harder than normal. When pressure in the blood vessels is high, the heart needs to pump (contract) with more force in order to circulate blood throughout the body. High blood pressure eventually causes the heart to become stiff and weak. °· Coronary artery disease (CAD). CAD is the buildup of cholesterol and fat (plaque) in the arteries of the heart. The blockage in the arteries deprives the heart muscle of oxygen and blood. This can cause chest pain and may lead to a heart attack. High blood pressure can also contribute to CAD. °· Heart attack (myocardial infarction). A heart attack occurs when one or more arteries in the heart become blocked. The loss of oxygen damages the muscle tissue of the heart. When this happens, part of the heart muscle dies. The injured tissue does not contract as well and weakens the heart's ability to pump blood. °· Abnormal heart valves. When the heart valves do not open and close properly, it can cause heart failure. This makes the heart muscle pump harder to keep the blood flowing. °· Heart muscle disease (cardiomyopathy or myocarditis). Heart muscle disease is damage to the heart muscle from a variety of causes. These can include drug or alcohol abuse, infections, or unknown reasons. These can increase the risk of heart failure. °· Lung disease. Lung disease  makes the heart work harder because the lungs do not work properly. This can cause a strain on the heart, leading it to fail. °· Diabetes. Diabetes increases the risk of heart failure. High blood sugar contributes to high fat (lipid) levels in the blood. Diabetes can also cause slow damage to tiny blood vessels that carry important nutrients to the heart muscle. When the heart does not get enough oxygen and food, it can cause the heart to become weak and stiff. This leads to a heart that does not contract efficiently. °· Other conditions can contribute to heart failure. These include abnormal heart rhythms, thyroid problems, and low blood counts (anemia). °Certain unhealthy behaviors can increase the risk of heart failure, including: °· Being overweight. °· Smoking or chewing tobacco. °· Eating foods high in fat and cholesterol. °· Abusing illicit drugs or alcohol. °· Lacking physical activity. °SYMPTOMS  °Heart failure symptoms may vary and can be hard to detect. Symptoms may include: °· Shortness of breath with activity, such as climbing stairs. °· Persistent cough. °· Swelling of the feet, ankles, legs, or abdomen. °· Unexplained weight gain. °· Difficulty breathing when lying flat (orthopnea). °· Waking from sleep because of the need to sit up and get more air. °· Rapid heartbeat. °· Fatigue and loss of energy. °· Feeling light-headed, dizzy, or close to fainting. °· Loss of appetite. °· Nausea. °· Increased urination during the night (nocturia). °DIAGNOSIS  °A diagnosis of heart failure is based on your history, symptoms, physical examination, and diagnostic tests. Diagnostic tests for heart failure may include: °·   Echocardiography. °· Electrocardiography. °· Chest X-ray. °· Blood tests. °· Exercise stress test. °· Cardiac angiography. °· Radionuclide scans. °TREATMENT  °Treatment is aimed at managing the symptoms of heart failure. Medicines, behavioral changes, or surgical intervention may be necessary to  treat heart failure. °· Medicines to help treat heart failure may include: °¨ Angiotensin-converting enzyme (ACE) inhibitors. This type of medicine blocks the effects of a blood protein called angiotensin-converting enzyme. ACE inhibitors relax (dilate) the blood vessels and help lower blood pressure. °¨ Angiotensin receptor blockers (ARBs). This type of medicine blocks the actions of a blood protein called angiotensin. Angiotensin receptor blockers dilate the blood vessels and help lower blood pressure. °¨ Water pills (diuretics). Diuretics cause the kidneys to remove salt and water from the blood. The extra fluid is removed through urination. This loss of extra fluid lowers the volume of blood the heart pumps. °¨ Beta blockers. These prevent the heart from beating too fast and improve heart muscle strength. °¨ Digitalis. This increases the force of the heartbeat. °· Healthy behavior changes include: °¨ Obtaining and maintaining a healthy weight. °¨ Stopping smoking or chewing tobacco. °¨ Eating heart-healthy foods. °¨ Limiting or avoiding alcohol. °¨ Stopping illicit drug use. °¨ Physical activity as directed by your health care provider. °· Surgical treatment for heart failure may include: °¨ A procedure to open blocked arteries, repair damaged heart valves, or remove damaged heart muscle tissue. °¨ A pacemaker to improve heart muscle function and control certain abnormal heart rhythms. °¨ An internal cardioverter defibrillator to treat certain serious abnormal heart rhythms. °¨ A left ventricular assist device (LVAD) to assist the pumping ability of the heart. °HOME CARE INSTRUCTIONS  °· Take medicines only as directed by your health care provider. Medicines are important in reducing the workload of your heart, slowing the progression of heart failure, and improving your symptoms. °¨ Do not stop taking your medicine unless directed by your health care provider. °¨ Do not skip any dose of medicine. °¨ Refill your  prescriptions before you run out of medicine. Your medicines are needed every day. °· Engage in moderate physical activity if directed by your health care provider. Moderate physical activity can benefit some people. The elderly and people with severe heart failure should consult with a health care provider for physical activity recommendations. °· Eat heart-healthy foods. Food choices should be free of trans fat and low in saturated fat, cholesterol, and salt (sodium). Healthy choices include fresh or frozen fruits and vegetables, fish, lean meats, legumes, fat-free or low-fat dairy products, and whole grain or high fiber foods. Talk to a dietitian to learn more about heart-healthy foods. °· Limit sodium if directed by your health care provider. Sodium restriction may reduce symptoms of heart failure in some people. Talk to a dietitian to learn more about heart-healthy seasonings. °· Use healthy cooking methods. Healthy cooking methods include roasting, grilling, broiling, baking, poaching, steaming, or stir-frying. Talk to a dietitian to learn more about healthy cooking methods. °· Limit fluids if directed by your health care provider. Fluid restriction may reduce symptoms of heart failure in some people. °· Weigh yourself every day. Daily weights are important in the early recognition of excess fluid. You should weigh yourself every morning after you urinate and before you eat breakfast. Wear the same amount of clothing each time you weigh yourself. Record your daily weight. Provide your health care provider with your weight record. °· Monitor and record your blood pressure if directed by your health care   provider. °· Check your pulse if directed by your health care provider. °· Lose weight if directed by your health care provider. Weight loss may reduce symptoms of heart failure in some people. °· Stop smoking or chewing tobacco. Nicotine makes your heart work harder by causing your blood vessels to constrict.  Do not use nicotine gum or patches before talking to your health care provider. °· Keep all follow-up visits as directed by your health care provider. This is important. °· Limit alcohol intake to no more than 1 drink per day for nonpregnant women and 2 drinks per day for men. One drink equals 12 ounces of beer, 5 ounces of wine, or 1½ ounces of hard liquor. Drinking more than that is harmful to your heart. Tell your health care provider if you drink alcohol several times a week. Talk with your health care provider about whether alcohol is safe for you. If your heart has already been damaged by alcohol or you have severe heart failure, drinking alcohol should be stopped completely. °· Stop illicit drug use. °· Stay up-to-date with immunizations. It is especially important to prevent respiratory infections through current pneumococcal and influenza immunizations. °· Manage other health conditions such as hypertension, diabetes, thyroid disease, or abnormal heart rhythms as directed by your health care provider. °· Learn to manage stress. °· Plan rest periods when fatigued. °· Learn strategies to manage high temperatures. If the weather is extremely hot: °¨ Avoid vigorous physical activity. °¨ Use air conditioning or fans or seek a cooler location. °¨ Avoid caffeine and alcohol. °¨ Wear loose-fitting, lightweight, and light-colored clothing. °· Learn strategies to manage cold temperatures. If the weather is extremely cold: °¨ Avoid vigorous physical activity. °¨ Layer clothes. °¨ Wear mittens or gloves, a hat, and a scarf when going outside. °¨ Avoid alcohol. °· Obtain ongoing education and support as needed. °· Participate in or seek rehabilitation as needed to maintain or improve independence and quality of life. °SEEK MEDICAL CARE IF:  °· Your weight increases by 03 lb/1.4 kg in 1 day or 05 lb/2.3 kg in a week. °· You have increasing shortness of breath that is unusual for you. °· You are unable to participate in  your usual physical activities. °· You tire easily. °· You cough more than normal, especially with physical activity. °· You have any or more swelling in areas such as your hands, feet, ankles, or abdomen. °· You are unable to sleep because it is hard to breathe. °· You feel like your heart is beating fast (palpitations). °· You become dizzy or light-headed upon standing up. °SEEK IMMEDIATE MEDICAL CARE IF:  °· You have difficulty breathing. °· There is a change in mental status such as decreased alertness or difficulty with concentration. °· You have a pain or discomfort in your chest. °· You have an episode of fainting (syncope). °MAKE SURE YOU:  °· Understand these instructions. °· Will watch your condition. °· Will get help right away if you are not doing well or get worse. °Document Released: 05/03/2005 Document Revised: 09/17/2013 Document Reviewed: 06/02/2012 °ExitCare® Patient Information ©2015 [employer]. This information is not intended to replace advice given to you by your health care provider. Make sure you discuss any questions you have with your health care provider. ° ° °Emergency Department Resource Guide °1) Find a Doctor and Pay Out of Pocket °Although you won't have to find out who is covered by your insurance plan, it is a good idea to ask around   and get recommendations. You will then need to call the office and see if the doctor you have chosen will accept you as a new patient and what types of options they offer for patients who are self-pay. Some doctors offer discounts or will set up payment plans for their patients who do not have insurance, but you will need to ask so you aren't surprised when you get to your appointment. ° °2) Contact Your Local Health Department °Not all health departments have doctors that can see patients for sick visits, but many do, so it is worth a call to see if yours does. If you don't know where your local health department is, you can check in your phone  book. The CDC also has a tool to help you locate your state's health department, and many state websites also have listings of all of their local health departments. ° °3) Find a Walk-in Clinic °If your illness is not likely to be very severe or complicated, you may want to try a walk in clinic. These are popping up all over the country in pharmacies, drugstores, and shopping centers. They're usually staffed by nurse practitioners or physician assistants that have been trained to treat common illnesses and complaints. They're usually fairly quick and inexpensive. However, if you have serious medical issues or chronic medical problems, these are probably not your best option. ° °No Primary Care Doctor: °- Call [facility] at  832-8000 - they can help you locate a primary care doctor that  accepts your insurance, provides certain services, etc. °- Physician Referral Service- 1-800-533-3463 ° °Chronic Pain Problems: °Organization         Address  Phone   Notes  °[facility]  (336) 297-2271 Patients need to be referred by their primary care doctor.  ° °Medication Assistance: °Organization         Address  Phone   Notes  °[place] [facility] [address] °[place], [place] [address] (336) 641-8030 --Must be a resident of [place] °-- Must have NO insurance coverage whatsoever (no Medicaid/ Medicare, etc.) °-- The pt. MUST have a primary care doctor that directs their care regularly and follows them in the community °  °[facility]  (866) 331-1348   °[employer]  (888) 892-1162   ° °Agencies that provide inexpensive medical care: °Organization         Address  Phone   Notes  °[facility]  (336) 832-8035   °[facility]    (336) 832-7272   °[facility] [address] °[place], [place] [address] (336) 832-4777   °[facility] [address], °[place] (336) 271-4999   °[facility]    (336) 373-0678   °[facility]    (336) 272-1050   °[facility] ° [address], [place] Phone:  (336) 832-4444, Fax:  (336) 832-4440 Hours of Operation:  9 am - 6 pm, M-F.  Also accepts Medicaid/Medicare and self-pay.  °[facility] ° [address], [place] Phone: (336) [phone], Fax: (336) [phone]. Hours of Operation:  8:30 am - 5:30 pm, M-F.  Also accepts Medicaid and self-pay.  °[facility] [place] [address], [place] Phone: (336) 878-6027   °[facility] [address], [place], [place] (336)723-1848, Ext. 123 Mondays & Thursdays: 7-9 AM.  First 15 patients are seen on   a first come, first serve basis. °  ° °Medicaid-accepting [place] Providers: ° °Organization         Address  Phone   Notes  °[facility] [address], [place] (336) 641-2100 Also accepts self-pay patients.  °[facility] [address], [place] ° (336) 856-9996   °[facility] [address], [place] (336) 288-8857   °[facility] [address] [place] [address], [place] (336) 299-7000   °Veita Bland [address], [place]  ° (336) 373-1557 Only accepts [place] Access Medicaid patients after they have their name applied to their card.  ° °Self-Pay (no insurance) in [place]: ° °Organization         Address  Phone   Notes  °Sickle Cell Patients, [facility] [address], [place] (336) 832-1970   °[facility] [address], [place] (336) 832-4400   °[facility] ° [address], [place] (336) 992-4800   °[facility]/Dr. Osei-Bonsu ° [address] [place] [address], [place] or [address], [place] (336) [phone] Phone number for both [place] and [place] locations is the same.    °[facility] [address], [place] (336) 299-0000   °[facility] [address] [place] [address], [place] or [address] (336) 852-7530 °(336) 878-2260   °[facility] [address], [place] (336) 350-1642, phone; (336) 294-5005, fax Sees patients 1st and 3rd Saturday of every month.  Must not qualify for public or private insurance (i.e. Medicaid, Medicare, [place] Health Choice, Veterans' Benefits) • Household income should be no more than 200% of the poverty level •The clinic cannot treat you if you are pregnant or think you are pregnant • Sexually transmitted diseases are not treated at the clinic.  ° ° °Dental Care: °Organization         Address  Phone  Notes  °[facility] [address], [place] (336) 641-6152 Accepts children up to age 21 who are enrolled in Medicaid or [place] Health Choice; pregnant women with a Medicaid card; and children who have applied for Medicaid or [place] Health Choice, but were declined, whose parents can pay a reduced fee at time of service.  °[facility] [place]  [address], [place] (336) 641-7733 Accepts children up to age 21 who are enrolled in Medicaid or [place] Health Choice; pregnant women with a Medicaid card; and children who have applied for Medicaid or  Health Choice, but were declined, whose parents can pay a reduced fee at time of service.  °[facility] ° [address], [place] (336) 641-4533 Patients are seen by appointment only. Walk-ins are not accepted. [facility] will see patients 18 years of age and older. °Monday - Tuesday (8am-5pm) °Most Wednesdays (8:30-5pm) °$30 per visit, cash only  °[facility] ° [address], [place] (336) 641-4533 Patients are seen by appointment only. Walk-ins are not accepted. [facility] will see patients 18  years of age and older. °One Wednesday Evening (Monthly: Volunteer Based).  $30 per visit, cash only  °[facility]  (919) [phone] for adults; Children under age 4, call Graduate Pediatric Dentistry at ([phone]) [phone].   Children aged 4-14, please call ([phone]) [phone] to request a pediatric application. ° Dental services are provided in all areas of dental care including fillings, crowns and bridges, complete and partial dentures, implants, gum treatment, root canals, and extractions. Preventive care is also provided. Treatment is provided to both adults and children. °Patients are selected via a lottery and there is often a waiting list. °  °[facility] [address], °[place] ° (336) 763-8833 [email or web address] °  °[facility] [address], [place], [place] (336)723-1848, Ext. 123 Second and Fourth Thursday of each month, opens at 6:30 AM; Clinic ends at 9 AM.  Patients are seen on a first-come first-served basis, and a limited number are seen during each clinic.  ° °[facility] ° [address], [place], [place] (336) 723-7904   Eligibility Requirements °You must have lived in [place], or [place] counties for at least the last three months. °  You cannot be eligible for state or federal sponsored healthcare insurance, including Veterans Administration, Medicaid, or Medicare. °  You generally cannot be eligible for healthcare insurance through your employer.  °  How to apply: °Eligibility screenings are held every Tuesday and Wednesday afternoon from 1:00 pm until 4:00 pm. You do not need an appointment for the interview!  °[facility] [address], [place], [place] 336-631-2330   °[facility]  336-342-8273   °[facility]  336-703-3100   °[facility]  336-570-6415   ° °Behavioral Health Resources in the Community: °Intensive Outpatient  Programs °Organization         Address  Phone  Notes  °[facility] [address], [place], [place] 336-878-6098   °[facility] [address], [place], [place] 336-832-9800   °[facility] [address], [place], [place] ° 336-882-2125   °[facility] [address],  °[place], [place] 1-800-853-5163 or 336-641-4981   °Substance Abuse Resources °Organization         Address  Phone  Notes  °[facility]  336-882-2125   °[facility]  336-784-9470   °[facility]  336-285-9073   °[facility]  336-845-3988   °Residential & Outpatient Substance Abuse Program  1-800-659-3381   °Psychological Services °Organization         Address  Phone  Notes  °[facility]  336- 832-9600   °[facility]  336- 378-7881   °[facility] [address], [place] 1-800-853-5163 or 336-641-4981   ° °Mobile Crisis Teams °Organization         Address  Phone  Notes  °[facility], Mobile Crisis Care Unit  1-877-626-1772   °Assertive °[facility] ° [address] [place], [place] 336-834-9664   °Sharon DeEsch [address] °[place] 336-554-5454   ° °Self-Help/Support Groups °Organization         Address  Phone             Notes  °[facility] - variety of support groups  336- 373-1402 Call for more information  °Narcotics Anonymous (NA), [facility] [address], °[place]  2 meetings at this location  ° °Residential Treatment Programs °Organization         Address  Phone  Notes  °[facility] [address],    °   1-866-801-8205   °  [facility] ° [address], [place], [place] 704-293-8524   °[facility] [address], [place] 336-845-3988 Admissions: 8am-3pm M-F  °[facility] [address]    °[place], [place]  336-841-1104   °[facility] [address], [place], [place] 336-379-7146   °[facility] [address]  °[place], [place] 336-285-9073   °[facility] - Intensive Outpatient [address], [place], [place] 336-852-3033   °[facility] [address]  °[place], [place] 1-877-615-2722 or 336-784-9470   °[facility] [address] [place], [place] 336-227-7417 Accepts Medicaid  °[facility] [address]  °[place] 1-800-659-3381 Substance Abuse/Addiction Treatment  ° °[place] Behavioral Health Resources °Organization         Address  Phone  Notes  °[facility]  (888) 581-9988   °Julie Brannon, PhD [address] [place], [place]   (336) 349-5553 or (336) 951-0000   °[facility]   [address] °[place], [place] (336) 349-4454   °[facility] [address], [place], [place] (336) 342-8316 Insurance/Medicaid/sponsorship through [facility]  °[facility] [address]                                    [place], [place] (336) 342-8316 Therapy/tele-psych/case  °[facility] [address]  ° [place],  (336) 349-2233    °Dr. Arfeen  (336) 349-4544   °[facility] [place]  [employer] [place] [facility] 1) [address], [place] °2) [address], [place] °3)  [address], [place] (336) 349-3220 °(336) 342-7768 ° °(336) 342-8140   °[place] [facility] (336) 342-1394 or (336) 342-3537 (After Hours)    ° ° ° °

## 2014-11-27 NOTE — ED Notes (Signed)
Pt refused to put yellow socks on, pt made aware of the reasoning and safety precautions we take.  Pt would like to keep her blue no slip socks on at this time.

## 2014-11-27 NOTE — ED Notes (Signed)
MD at bedside. 

## 2014-11-27 NOTE — ED Notes (Signed)
Pt presents via [facility] for increasing SOB x 2 days.  Pt hx Afib and non compliance with medications.  Pt denies missing any Lasik.  Pt o2-90% RA, 94% 4L.  BP-163/98 P-85-100 Afib, pt a x 4, NAD.

## 2014-11-27 NOTE — ED Notes (Signed)
Heart healthy tray ordered for pt.  

## 2014-11-27 NOTE — ED Notes (Signed)
Patient transported to X-ray 
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
## 2014-12-05 ENCOUNTER — Emergency Department (HOSPITAL_COMMUNITY): Payer: Medicaid - Out of State

## 2014-12-05 ENCOUNTER — Emergency Department (HOSPITAL_COMMUNITY)
Admission: EM | Admit: 2014-12-05 | Discharge: 2014-12-06 | Disposition: A | Discharge status: Home / Self Care | Payer: Medicaid - Out of State | Attending: Emergency Medicine | Admitting: Emergency Medicine

## 2014-12-05 ENCOUNTER — Encounter (HOSPITAL_COMMUNITY): Payer: Self-pay | Admitting: Emergency Medicine

## 2014-12-05 DIAGNOSIS — Z9119 Patient's noncompliance with other medical treatment and regimen: Secondary | ICD-10-CM | POA: Insufficient documentation

## 2014-12-05 DIAGNOSIS — M170 Bilateral primary osteoarthritis of knee: Secondary | ICD-10-CM | POA: Insufficient documentation

## 2014-12-05 DIAGNOSIS — I5022 Chronic systolic (congestive) heart failure: Secondary | ICD-10-CM | POA: Insufficient documentation

## 2014-12-05 DIAGNOSIS — R0602 Shortness of breath: Secondary | ICD-10-CM

## 2014-12-05 DIAGNOSIS — I10 Essential (primary) hypertension: Secondary | ICD-10-CM | POA: Insufficient documentation

## 2014-12-05 DIAGNOSIS — Z79899 Other long term (current) drug therapy: Secondary | ICD-10-CM | POA: Insufficient documentation

## 2014-12-05 DIAGNOSIS — E119 Type 2 diabetes mellitus without complications: Secondary | ICD-10-CM | POA: Insufficient documentation

## 2014-12-05 DIAGNOSIS — Z7982 Long term (current) use of aspirin: Secondary | ICD-10-CM | POA: Insufficient documentation

## 2014-12-05 DIAGNOSIS — Z880 Allergy status to penicillin: Secondary | ICD-10-CM | POA: Insufficient documentation

## 2014-12-05 DIAGNOSIS — I480 Paroxysmal atrial fibrillation: Secondary | ICD-10-CM | POA: Insufficient documentation

## 2014-12-05 LAB — CBC WITH DIFFERENTIAL/PLATELET
Basophils Absolute: 0.1 10*3/uL (ref 0.0–0.1)
Basophils Relative: 1 % (ref 0–1)
Eosinophils Absolute: 0.4 10*3/uL (ref 0.0–0.7)
Eosinophils Relative: 3 % (ref 0–5)
HCT: 36.5 % (ref 36.0–46.0)
Hemoglobin: 11.7 g/dL — ABNORMAL LOW (ref 12.0–15.0)
Lymphocytes Relative: 15 % (ref 12–46)
Lymphs Abs: 1.5 10*3/uL (ref 0.7–4.0)
MCH: 33.1 pg (ref 26.0–34.0)
MCHC: 32.1 g/dL (ref 30.0–36.0)
MCV: 103.4 fL — ABNORMAL HIGH (ref 78.0–100.0)
Monocytes Absolute: 0.7 10*3/uL (ref 0.1–1.0)
Monocytes Relative: 7 % (ref 3–12)
Neutro Abs: 7.5 10*3/uL (ref 1.7–7.7)
Neutrophils Relative %: 74 % (ref 43–77)
Platelets: 195 10*3/uL (ref 150–400)
RBC: 3.53 MIL/uL — ABNORMAL LOW (ref 3.87–5.11)
RDW: 15.5 % (ref 11.5–15.5)
WBC: 10.2 10*3/uL (ref 4.0–10.5)

## 2014-12-05 LAB — COMPREHENSIVE METABOLIC PANEL
ALT: 14 U/L (ref 14–54)
AST: 16 U/L (ref 15–41)
Albumin: 3.2 g/dL — ABNORMAL LOW (ref 3.5–5.0)
Alkaline Phosphatase: 43 U/L (ref 38–126)
Anion gap: 7 (ref 5–15)
BUN: 14 mg/dL (ref 6–20)
CO2: 24 mmol/L (ref 22–32)
Calcium: 8.4 mg/dL — ABNORMAL LOW (ref 8.9–10.3)
Chloride: 103 mmol/L (ref 101–111)
Creatinine, Ser: 0.92 mg/dL (ref 0.44–1.00)
GFR calc Af Amer: >60 mL/min (ref >60)
GFR calc non Af Amer: >60 mL/min (ref >60)
Glucose, Bld: 270 mg/dL — ABNORMAL HIGH (ref 65–99)
Potassium: 3.9 mmol/L (ref 3.5–5.1)
Sodium: 134 mmol/L — ABNORMAL LOW (ref 135–145)
Total Bilirubin: 1.2 mg/dL (ref 0.3–1.2)
Total Protein: 6.6 g/dL (ref 6.5–8.1)

## 2014-12-05 MED ORDER — METOPROLOL SUCCINATE ER 25 MG PO TB24
100.0000 mg | ORAL_TABLET | Freq: Every day | ORAL | Status: DC
  Administered 2014-12-05: 100 mg via ORAL
  Filled 2014-12-05: qty 4

## 2014-12-05 NOTE — ED Notes (Signed)
Leaving with [facility] at this time. 

## 2014-12-05 NOTE — ED Notes (Signed)
PTAR CALLED@[id number] .

## 2014-12-05 NOTE — Progress Notes (Addendum)
Patient is known to ED CM, This patient is known to this CM. ED CM has assisted patient with medications and follow up care, she has no showed for 7 scheduled follow up appointment between [facility] and [facility]. Patient is non compliant and refuses to follow up with switching Medicaid from [facility] to [place], despite several offers to assist with transportation to the DSS. Unfortunately, CM resources have been exhausted.
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
## 2014-12-05 NOTE — ED Notes (Signed)
EMS called out for SOB r/t afib.  Ongoing issue.  Whenever she feels her heart rate is high she gets sob.

## 2014-12-09 ENCOUNTER — Encounter (HOSPITAL_COMMUNITY): Payer: Self-pay | Admitting: Emergency Medicine

## 2014-12-09 ENCOUNTER — Emergency Department (HOSPITAL_COMMUNITY): Payer: Medicaid - Out of State

## 2014-12-09 ENCOUNTER — Emergency Department (HOSPITAL_COMMUNITY)
Admission: EM | Admit: 2014-12-09 | Discharge: 2014-12-09 | Disposition: A | Discharge status: Home / Self Care | Payer: Medicaid - Out of State | Attending: Emergency Medicine | Admitting: Emergency Medicine

## 2014-12-09 DIAGNOSIS — Z872 Personal history of diseases of the skin and subcutaneous tissue: Secondary | ICD-10-CM | POA: Insufficient documentation

## 2014-12-09 DIAGNOSIS — M1990 Unspecified osteoarthritis, unspecified site: Secondary | ICD-10-CM | POA: Insufficient documentation

## 2014-12-09 DIAGNOSIS — I10 Essential (primary) hypertension: Secondary | ICD-10-CM | POA: Insufficient documentation

## 2014-12-09 DIAGNOSIS — I4891 Unspecified atrial fibrillation: Secondary | ICD-10-CM | POA: Insufficient documentation

## 2014-12-09 DIAGNOSIS — Z880 Allergy status to penicillin: Secondary | ICD-10-CM | POA: Insufficient documentation

## 2014-12-09 DIAGNOSIS — Z9119 Patient's noncompliance with other medical treatment and regimen: Secondary | ICD-10-CM | POA: Insufficient documentation

## 2014-12-09 DIAGNOSIS — R0602 Shortness of breath: Secondary | ICD-10-CM

## 2014-12-09 DIAGNOSIS — E119 Type 2 diabetes mellitus without complications: Secondary | ICD-10-CM | POA: Insufficient documentation

## 2014-12-09 DIAGNOSIS — I5022 Chronic systolic (congestive) heart failure: Secondary | ICD-10-CM | POA: Insufficient documentation

## 2014-12-09 DIAGNOSIS — Z79899 Other long term (current) drug therapy: Secondary | ICD-10-CM | POA: Insufficient documentation

## 2014-12-09 DIAGNOSIS — Z7982 Long term (current) use of aspirin: Secondary | ICD-10-CM | POA: Insufficient documentation

## 2014-12-09 DIAGNOSIS — I4892 Unspecified atrial flutter: Secondary | ICD-10-CM | POA: Insufficient documentation

## 2014-12-09 DIAGNOSIS — R609 Edema, unspecified: Secondary | ICD-10-CM | POA: Insufficient documentation

## 2014-12-09 LAB — BASIC METABOLIC PANEL
Anion gap: 6 (ref 5–15)
BUN: 14 mg/dL (ref 6–20)
CO2: 22 mmol/L (ref 22–32)
Calcium: 8.5 mg/dL — ABNORMAL LOW (ref 8.9–10.3)
Chloride: 108 mmol/L (ref 101–111)
Creatinine, Ser: 0.81 mg/dL (ref 0.44–1.00)
GFR calc Af Amer: >60 mL/min (ref >60)
GFR calc non Af Amer: >60 mL/min (ref >60)
Glucose, Bld: 277 mg/dL — ABNORMAL HIGH (ref 65–99)
Potassium: 3.9 mmol/L (ref 3.5–5.1)
Sodium: 136 mmol/L (ref 135–145)

## 2014-12-09 LAB — CBC
HCT: 36.4 % (ref 36.0–46.0)
Hemoglobin: 11.9 g/dL — ABNORMAL LOW (ref 12.0–15.0)
MCH: 33.8 pg (ref 26.0–34.0)
MCHC: 32.7 g/dL (ref 30.0–36.0)
MCV: 103.4 fL — ABNORMAL HIGH (ref 78.0–100.0)
Platelets: 207 10*3/uL (ref 150–400)
RBC: 3.52 MIL/uL — ABNORMAL LOW (ref 3.87–5.11)
RDW: 15.5 % (ref 11.5–15.5)
WBC: 9.3 10*3/uL (ref 4.0–10.5)

## 2014-12-09 LAB — I-STAT TROPONIN, ED: Troponin i, poc: 0 ng/mL (ref 0.00–0.08)

## 2014-12-09 LAB — BRAIN NATRIURETIC PEPTIDE: B Natriuretic Peptide: 959.6 pg/mL — ABNORMAL HIGH (ref 0.0–100.0)

## 2014-12-09 NOTE — ED Notes (Signed)
[facility] called  

## 2014-12-09 NOTE — Discharge Instructions (Signed)
Atrial Fibrillation °Atrial fibrillation is a type of irregular heart rhythm (arrhythmia). During atrial fibrillation, the upper chambers of the heart (atria) quiver continuously in a chaotic pattern. This causes an irregular and often rapid heart rate.  °Atrial fibrillation is the result of the heart becoming overloaded with disorganized signals that tell it to beat. These signals are normally released one at a time by a part of the right atrium called the sinoatrial node. They then travel from the atria to the lower chambers of the heart (ventricles), causing the atria and ventricles to contract and pump blood as they pass. In atrial fibrillation, parts of the atria outside of the sinoatrial node also release these signals. This results in two problems. First, the atria receive so many signals that they do not have time to fully contract. Second, the ventricles, which can only receive one signal at a time, beat irregularly and out of rhythm with the atria.  °There are three types of atrial fibrillation:  °· Paroxysmal. Paroxysmal atrial fibrillation starts suddenly and stops on its own within a week. °· Persistent. Persistent atrial fibrillation lasts for more than a week. It may stop on its own or with treatment. °· Permanent. Permanent atrial fibrillation does not go away. Episodes of atrial fibrillation may lead to permanent atrial fibrillation. °Atrial fibrillation can prevent your heart from pumping blood normally. It increases your risk of stroke and can lead to heart failure.  °CAUSES  °· Heart conditions, including a heart attack, heart failure, coronary artery disease, and heart valve conditions.   °· Inflammation of the sac that surrounds the heart (pericarditis). °· Blockage of an artery in the lungs (pulmonary embolism). °· Pneumonia or other infections. °· Chronic lung disease. °· Thyroid problems, especially if the thyroid is overactive (hyperthyroidism). °· Caffeine, excessive alcohol use, and use  of some illegal drugs.   °· Use of some medicines, including certain decongestants and diet pills. °· Heart surgery.   °· Birth defects.   °Sometimes, no cause can be found. When this happens, the atrial fibrillation is called lone atrial fibrillation. The risk of complications from atrial fibrillation increases if you have lone atrial fibrillation and you are age 60 years or older. °RISK FACTORS °· Heart failure. °· Coronary artery disease. °· Diabetes mellitus.   °· High blood pressure (hypertension).   °· Obesity.   °· Other arrhythmias.   °· Increased age. °SIGNS AND SYMPTOMS  °· A feeling that your heart is beating rapidly or irregularly.   °· A feeling of discomfort or pain in your chest.   °· Shortness of breath.   °· Sudden light-headedness or weakness.   °· Getting tired easily when exercising.   °· Urinating more often than normal (mainly when atrial fibrillation first begins).   °In paroxysmal atrial fibrillation, symptoms may start and suddenly stop. °DIAGNOSIS  °Your health care provider may be able to detect atrial fibrillation when taking your pulse. Your health care provider may have you take a test called an ambulatory electrocardiogram (ECG). An ECG records your heartbeat patterns over a 24-hour period. You may also have other tests, such as: °· Transthoracic echocardiogram (TTE). During echocardiography, sound waves are used to evaluate how blood flows through your heart. °· Transesophageal echocardiogram (TEE). °· Stress test. There is more than one type of stress test. If a stress test is needed, ask your health care provider about which type is best for you. °· Chest X-ray exam. °· Blood tests. °· Computed tomography (CT). °TREATMENT  °Treatment may include: °· Treating any underlying conditions. For example, if you   have an overactive thyroid, treating the condition may correct atrial fibrillation. °· Taking medicine. Medicines may be given to control a rapid heart rate or to prevent blood  clots, heart failure, or a stroke. °· Having a procedure to correct the rhythm of the heart: °¨ Electrical cardioversion. During electrical cardioversion, a controlled, low-energy shock is delivered to the heart through your skin. If you have chest pain, very low blood pressure, or sudden heart failure, this procedure may need to be done as an emergency. °¨ Catheter ablation. During this procedure, heart tissues that send the signals that cause atrial fibrillation are destroyed. °¨ Surgical ablation. During this surgery, thin lines of heart tissue that carry the abnormal signals are destroyed. This procedure can either be an open-heart surgery or a minimally invasive surgery. With the minimally invasive surgery, small cuts are made to access the heart instead of a large opening. °¨ Pulmonary venous isolation. During this surgery, tissue around the veins that carry blood from the lungs (pulmonary veins) is destroyed. This tissue is thought to carry the abnormal signals. °HOME CARE INSTRUCTIONS  °· Take medicines only as directed by your health care provider. Some medicines can make atrial fibrillation worse or recur. °· If blood thinners were prescribed by your health care provider, take them exactly as directed. Too much blood-thinning medicine can cause bleeding. If you take too little, you will not have the needed protection against stroke and other problems. °· Perform blood tests at home if directed by your health care provider. Perform blood tests exactly as directed. °· Quit smoking if you smoke. °· Do not drink alcohol. °· Do not drink caffeinated beverages such as coffee, soda, and some teas. You may drink decaffeinated coffee, soda, or tea.   °· Maintain a healthy weight. Do not use diet pills unless your health care provider approves. They may make heart problems worse.   °· Follow diet instructions as directed by your health care provider. °· Exercise regularly as directed by your health care  provider. °· Keep all follow-up visits as directed by your health care provider. This is important. °PREVENTION  °The following substances can cause atrial fibrillation to recur:  °· Caffeinated beverages. °· Alcohol. °· Certain medicines, especially those used for breathing problems. °· Certain herbs and herbal medicines, such as those containing ephedra or ginseng. °· Illegal drugs, such as cocaine and amphetamines. °Sometimes medicines are given to prevent atrial fibrillation from recurring. Proper treatment of any underlying condition is also important in helping prevent recurrence.  °SEEK MEDICAL CARE IF: °· You notice a change in the rate, rhythm, or strength of your heartbeat. °· You suddenly begin urinating more frequently. °· You tire more easily when exerting yourself or exercising. °SEEK IMMEDIATE MEDICAL CARE IF:  °· You have chest pain, abdominal pain, sweating, or weakness. °· You feel nauseous. °· You have shortness of breath. °· You suddenly have swollen feet and ankles. °· You feel dizzy. °· Your face or limbs feel numb or weak. °· You have a change in your vision or speech. °MAKE SURE YOU:  °· Understand these instructions. °· Will watch your condition. °· Will get help right away if you are not doing well or get worse. °Document Released: 05/03/2005 Document Revised: 09/17/2013 Document Reviewed: 06/13/2012 °ExitCare® Patient Information ©2015 [employer]. This information is not intended to replace advice given to you by your health care provider. Make sure you discuss any questions you have with your health care provider. ° ° °Emergency Department Resource Guide °  1) Find a Doctor and Pay Out of Pocket °Although you won't have to find out who is covered by your insurance plan, it is a good idea to ask around and get recommendations. You will then need to call the office and see if the doctor you have chosen will accept you as a new patient and what types of options they offer for patients who  are self-pay. Some doctors offer discounts or will set up payment plans for their patients who do not have insurance, but you will need to ask so you aren't surprised when you get to your appointment. ° °2) Contact Your Local Health Department °Not all health departments have doctors that can see patients for sick visits, but many do, so it is worth a call to see if yours does. If you don't know where your local health department is, you can check in your phone book. The CDC also has a tool to help you locate your state's health department, and many state websites also have listings of all of their local health departments. ° °3) Find a Walk-in Clinic °If your illness is not likely to be very severe or complicated, you may want to try a walk in clinic. These are popping up all over the country in pharmacies, drugstores, and shopping centers. They're usually staffed by nurse practitioners or physician assistants that have been trained to treat common illnesses and complaints. They're usually fairly quick and inexpensive. However, if you have serious medical issues or chronic medical problems, these are probably not your best option. ° °No Primary Care Doctor: °- Call [facility] at  832-8000 - they can help you locate a primary care doctor that  accepts your insurance, provides certain services, etc. °- Physician Referral Service- 1-800-533-3463 ° °Chronic Pain Problems: °Organization         Address  Phone   Notes  °[facility]  (336) 297-2271 Patients need to be referred by their primary care doctor.  ° °Medication Assistance: °Organization         Address  Phone   Notes  °[place] [facility] [address] °[place], [place] [address] (336) 641-8030 --Must be a resident of [place] °-- Must have NO insurance coverage whatsoever (no Medicaid/ Medicare, etc.) °-- The pt. MUST have a primary care doctor that directs their care regularly and follows  them in the community °  °[facility]  (866) 331-1348   °[employer]  (888) 892-1162   ° °Agencies that provide inexpensive medical care: °Organization         Address  Phone   Notes  °[facility]  (336) 832-8035   °[facility]    (336) 832-7272   °[facility] [address] °[place], [place] [address] (336) 832-4777   °[facility] [address], °[place] (336) 271-4999   °[facility]    (336) 373-0678   °[facility]    (336) 272-1050   °[facility] ° [address],  Phone:  (336) 832-4444, Fax:  (336) 832-4440 Hours of Operation:  9 am - 6 pm, M-F.  Also accepts Medicaid/Medicare and self-pay.  °[facility] ° [address],  Phone: (336) 832-3150, Fax: (336) 832-3151. Hours of Operation:  8:30 am - 5:30 pm, M-F.  Also accepts Medicaid and self-pay.  °[facility] [place] [address], [place] Phone: (336) 878-6027   °[facility] [address], [place], [place] (336)723-1848, Ext. 123 Mondays & Thursdays: 7-9 AM.  First 15 patients are seen on a first come, first serve basis. °  ° °Medicaid-accepting [place] Providers: ° °Organization         Address  Phone   Notes  °[facility] [address], [place] (336) 641-2100 Also accepts self-pay patients.  °[facility] [address], [place] ° (336) 856-9996   °[facility] [address], [place] (336) 288-8857   °[facility] [address] [place] [address], [place] (336) 299-7000   °Veita Bland [address], [place]  ° (336) 373-1557 Only accepts  Access Medicaid patients after they have their name applied to their card.  ° °Self-Pay (no insurance) in [place]: ° °Organization         Address  Phone   Notes  °Sickle Cell  Patients, [facility] [address], [place] (336) 832-1970   °[facility] [address], [place] (336) 832-4400   °[facility] ° [address], [place] (336) 992-4800   °[facility]/Dr. Osei-Bonsu ° [address] [place] [address], [place] or [address], [place] (336) [phone] Phone number for both [place] and [place] locations is the same.  °[facility] [address], [place] (336) 299-0000   °[facility] [address] [place] [address], [place] or [address] (336) 852-7530 °(336) 878-2260   °[facility] [address], [place] (336) 350-1642, phone; (336) 294-5005, fax Sees patients 1st and 3rd Saturday of every month.  Must not qualify for public or private insurance (i.e. Medicaid, Medicare, [place] Health Choice, Veterans' Benefits) • Household income should be no more than 200% of the poverty level •The clinic cannot treat you if you are pregnant or think you are pregnant • Sexually transmitted diseases are not treated at the clinic.  ° ° °Dental Care: °Organization         Address  Phone  Notes  °[facility] [address], [place] (336) 641-6152 Accepts children up to age 21 who are enrolled in Medicaid or [place] Health Choice; pregnant women with a Medicaid card; and children who have applied for Medicaid or [place] Health Choice, but were declined, whose parents can pay a reduced fee at time of service.  °[facility] [place]  [address], [place] (336) 641-7733 Accepts children up to age 21 who are enrolled in Medicaid or [place] Health Choice; pregnant women with a Medicaid card; and children who have applied for Medicaid or [place] Health Choice, but were declined, whose parents can pay a reduced fee at time of service.  °[facility] ° [address],  (336) 641-4533 Patients are seen by appointment only. Walk-ins are not accepted. [facility] will see patients 18 years of age and older. °Monday - Tuesday (8am-5pm) °Most Wednesdays (8:30-5pm) °$30 per visit, cash only  °[facility] ° [address], [place] (336) 641-4533 Patients are seen by appointment only. Walk-ins are not accepted. [facility] will see patients 18 years of age and older. °One Wednesday Evening (Monthly:   Volunteer Based).  $30 per visit, cash only  °[facility]  (919) [phone] for adults; Children under age 4, call Graduate Pediatric Dentistry at ([phone]) [phone]. Children aged 4-14, please call ([phone]) [phone] to request a pediatric application. ° Dental services are provided in all areas of dental care including fillings, crowns and bridges, complete and partial dentures, implants, gum treatment, root canals, and extractions. Preventive care is also provided. Treatment is provided to both adults and children. °Patients are selected via a lottery and there is often a waiting list. °  °[facility] [address], °[place] ° (336) 763-8833 [email or web address] °  °[facility] [address], [place], [place] (336)723-1848, Ext. 123 Second and Fourth Thursday of each month, opens at 6:30 AM; Clinic ends at 9 AM.  Patients are seen on a first-come first-served basis, and a limited number are seen during each clinic.  ° °[facility] ° [address], [place], [place] (336) 723-7904   Eligibility Requirements °You must have lived in [place], or [place] counties for at least the last three months. °  You cannot be eligible for state or federal sponsored healthcare insurance, including Veterans Administration, Medicaid, or Medicare. °  You generally cannot be eligible for healthcare insurance through your employer.  °  How to apply: °Eligibility  screenings are held every Tuesday and Wednesday afternoon from 1:00 pm until 4:00 pm. You do not need an appointment for the interview!  °[facility] [address], [place], [place] 336-631-2330   °[facility]  336-342-8273   °[facility]  336-703-3100   °[facility]  336-570-6415   ° °Behavioral Health Resources in the Community: °Intensive Outpatient Programs °Organization         Address  Phone  Notes  °[facility] [address], [place], [place] 336-878-6098   °[facility] [address], [place], [place] 336-832-9800   °[facility] [address], [place], [place] ° 336-882-2125   °[facility] [address],  °[place], [place] 1-800-853-5163 or 336-641-4981   °Substance Abuse Resources °Organization         Address  Phone  Notes  °[facility]  336-882-2125   °[facility]  336-784-9470   °[facility]  336-285-9073   °[facility]  336-845-3988   °Residential & Outpatient Substance Abuse Program  1-800-659-3381   °Psychological Services °Organization         Address  Phone  Notes  ° Health  336- 832-9600   °[facility]  336- 378-7881   °[facility] [address], [place] 1-800-853-5163 or 336-641-4981   ° °Mobile Crisis Teams °Organization         Address  Phone  Notes  °[facility], Mobile Crisis Care Unit  1-877-626-1772   °Assertive °[facility] ° [address] [place], [place] 336-834-9664   °Sharon DeEsch [address] °[place] 336-554-5454   ° °Self-Help/Support Groups °Organization         Address  Phone             Notes  °[facility] - variety of support groups  336- 373-1402 Call for more information  °Narcotics Anonymous (NA), [facility] [address], °[place]  2 meetings at  this location  ° °Residential   Treatment Programs °Organization         Address  Phone  Notes  °[facility] [address],    °[place]  1-866-801-8205   °[facility] ° [address], [place], [place] 704-293-8524   °[facility] [address], [place] 336-845-3988 Admissions: 8am-3pm M-F  °[facility] [address]    °[place], [place] 336-841-1104   °[facility] [address], [place], [place] 336-379-7146   °[facility] [address]  °[place], [place] 336-285-9073   °[facility] - Intensive Outpatient [address], [place], [place] 336-852-3033   °[facility] [address]  °[place], [place] 1-877-615-2722 or 336-784-9470   °[facility] [address] [place], [place] 336-227-7417 Accepts Medicaid  °[facility] [address]  °[place] 1-800-659-3381 Substance Abuse/Addiction Treatment  ° °[place] Behavioral Health Resources °Organization         Address  Phone  Notes  °[facility]  (888) 581-9988   °Julie Brannon, PhD [address] [place], [place]   (336) 349-5553 or (336) 951-0000   °[facility]   [address] °[place], [place] (336) 349-4454   °[facility] [address], [place], [place] (336) 342-8316 Insurance/Medicaid/sponsorship through [facility]  °[facility] [address]                                    [place], [place] (336) 342-8316 Therapy/tele-psych/case  °[facility] [address]  ° [place], [place] (336) 349-2233    °Dr. Arfeen  (336) 349-4544   °[facility] [place]  [employer] [place] [facility] 1) [address], [place] °2) [address], [place] °3)  [address], [place] (336) 349-3220 °(336) 342-7768 ° °(336) 342-8140   °[place] [facility] (336) 342-1394 or (336)  342-3537 (After Hours)    ° ° ° °

## 2014-12-09 NOTE — ED Notes (Signed)
Patient c/o SOB x24hrs. 94%-96% RA. Hx of same. Denies Hx of asthma, COPD. States that it is caused by A-Fib.
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
## 2014-12-09 NOTE — ED Notes (Signed)
Bed: WA02 Expected date:  Expected time:  Means of arrival:  Comments: EMS-[facility] 

## 2014-12-09 NOTE — Progress Notes (Addendum)
EDCM consulted due to patient having 7 admissions with 11 ED visits within the last six months.  EDCM spoke to patient at bedside.  Patient with Medicaid out of State insurance without pcp.  Patient reports she has not started the process of having Medicaid changed to [facility].  EDCM provided patient with phone number to [employer].  Patient reports her prescriptions are delivered to her by [place].  Patient reports she is out of her Digoxin and Lisinopril but has refills left so she will call and have her prescriptions delivered.  Patient has Lopressor, Lasix and potassium available.  Patient reports she is a borderline diabetic and is concerned her blood sugar is elevated.  Patient does not have a glucometer at home.  Patient reports she does not have transportation to get to doctor appointments.  EDCM explained TCC at [facility].  Patient is very agreeable to these services. EDCM provided patient with pamphlet of [facility] with address and phone number of clinic. EDCM will place referral to [facility] for TCC.  Patient thankful for assistance.  No further EDCM needs at this time.

## 2014-12-10 ENCOUNTER — Telehealth: Payer: Self-pay

## 2014-12-10 NOTE — Telephone Encounter (Signed)
Request received from Radford Pax, CM to assess patient for the TCC.  Called the patient to discuss services provided at the [facility] and the TCC.  She said that she is not interested in the close follow up that the TCC provides.  She said that it is "too much."  She also reported that she called the [facility] this morning to schedule an appointment and was told that she could not be seen until September. This CM offered to check about scheduling an earlier appointment for her but she did not want that. She said that she received a call today from a "lady" ( she couldn't remember who) that told her to call the MD noted on her prescription bottles and to schedule an appt. That MD is Crista Curb. The patient said that is going to call Dr Lendell Caprice  but will call me back if she changes her mind.  .  She said that she has all of her medications and has enough for at least 2 weeks and she reported that she pays cash for her medications. .  She noted that she has enough food and has food delivered from [employer] weekly. She stated that her [place] medicaid has been terminated and she knows that she needs to apply for medicaid in [place] as it does not automatically roll over from state to state when a person relocates.  This CM provide her with a call back # [phone] if she changes her mind and would like to follow up at [facility].

## 2014-12-14 ENCOUNTER — Encounter (HOSPITAL_COMMUNITY): Payer: Self-pay | Admitting: Emergency Medicine

## 2014-12-14 ENCOUNTER — Emergency Department (HOSPITAL_COMMUNITY): Payer: Medicaid - Out of State

## 2014-12-14 ENCOUNTER — Inpatient Hospital Stay (HOSPITAL_COMMUNITY)
Admission: EM | Admit: 2014-12-14 | Discharge: 2014-12-16 | DRG: 292 | Disposition: A | Discharge status: Home / Self Care | Payer: Medicaid - Out of State | Attending: Internal Medicine | Admitting: Internal Medicine

## 2014-12-14 DIAGNOSIS — I509 Heart failure, unspecified: Secondary | ICD-10-CM

## 2014-12-14 DIAGNOSIS — I4891 Unspecified atrial fibrillation: Secondary | ICD-10-CM | POA: Diagnosis not present

## 2014-12-14 DIAGNOSIS — I5023 Acute on chronic systolic (congestive) heart failure: Secondary | ICD-10-CM | POA: Diagnosis not present

## 2014-12-14 DIAGNOSIS — E1165 Type 2 diabetes mellitus with hyperglycemia: Secondary | ICD-10-CM

## 2014-12-14 DIAGNOSIS — R0602 Shortness of breath: Secondary | ICD-10-CM

## 2014-12-14 DIAGNOSIS — R569 Unspecified convulsions: Secondary | ICD-10-CM

## 2014-12-14 DIAGNOSIS — I5043 Acute on chronic combined systolic (congestive) and diastolic (congestive) heart failure: Secondary | ICD-10-CM | POA: Diagnosis not present

## 2014-12-14 DIAGNOSIS — Z91199 Patient's noncompliance with other medical treatment and regimen due to unspecified reason: Secondary | ICD-10-CM

## 2014-12-14 DIAGNOSIS — I10 Essential (primary) hypertension: Secondary | ICD-10-CM | POA: Diagnosis not present

## 2014-12-14 DIAGNOSIS — E8770 Fluid overload, unspecified: Secondary | ICD-10-CM

## 2014-12-14 DIAGNOSIS — Z9119 Patient's noncompliance with other medical treatment and regimen: Secondary | ICD-10-CM

## 2014-12-14 DIAGNOSIS — Z7189 Other specified counseling: Secondary | ICD-10-CM

## 2014-12-14 DIAGNOSIS — I481 Persistent atrial fibrillation: Secondary | ICD-10-CM | POA: Diagnosis present

## 2014-12-14 DIAGNOSIS — Z713 Dietary counseling and surveillance: Secondary | ICD-10-CM

## 2014-12-14 DIAGNOSIS — Z9114 Patient's other noncompliance with medication regimen: Secondary | ICD-10-CM | POA: Diagnosis present

## 2014-12-14 DIAGNOSIS — Z7982 Long term (current) use of aspirin: Secondary | ICD-10-CM

## 2014-12-14 LAB — URINALYSIS, ROUTINE W REFLEX MICROSCOPIC
Bilirubin Urine: NEGATIVE
Glucose, UA: 250 mg/dL — AB
Hgb urine dipstick: NEGATIVE
Ketones, ur: NEGATIVE mg/dL
Nitrite: NEGATIVE
Protein, ur: 30 mg/dL — AB
Specific Gravity, Urine: 1.015 (ref 1.005–1.030)
Urobilinogen, UA: 1 mg/dL (ref 0.0–1.0)
pH: 5.5 (ref 5.0–8.0)

## 2014-12-14 LAB — BASIC METABOLIC PANEL
Anion gap: 7 (ref 5–15)
BUN: 16 mg/dL (ref 6–20)
CO2: 22 mmol/L (ref 22–32)
Calcium: 8.8 mg/dL — ABNORMAL LOW (ref 8.9–10.3)
Chloride: 108 mmol/L (ref 101–111)
Creatinine, Ser: 0.94 mg/dL (ref 0.44–1.00)
GFR calc Af Amer: >60 mL/min (ref >60)
GFR calc non Af Amer: >60 mL/min (ref >60)
Glucose, Bld: 325 mg/dL — ABNORMAL HIGH (ref 65–99)
Potassium: 4.2 mmol/L (ref 3.5–5.1)
Sodium: 137 mmol/L (ref 135–145)

## 2014-12-14 LAB — URINE MICROSCOPIC-ADD ON

## 2014-12-14 LAB — I-STAT TROPONIN, ED: Troponin i, poc: 0 ng/mL (ref 0.00–0.08)

## 2014-12-14 LAB — CBC
HCT: 36.4 % (ref 36.0–46.0)
Hemoglobin: 11.9 g/dL — ABNORMAL LOW (ref 12.0–15.0)
MCH: 33.6 pg (ref 26.0–34.0)
MCHC: 32.7 g/dL (ref 30.0–36.0)
MCV: 102.8 fL — ABNORMAL HIGH (ref 78.0–100.0)
Platelets: 208 10*3/uL (ref 150–400)
RBC: 3.54 MIL/uL — ABNORMAL LOW (ref 3.87–5.11)
RDW: 15.7 % — ABNORMAL HIGH (ref 11.5–15.5)
WBC: 10.9 10*3/uL — ABNORMAL HIGH (ref 4.0–10.5)

## 2014-12-14 LAB — BRAIN NATRIURETIC PEPTIDE: B Natriuretic Peptide: 1314.5 pg/mL — ABNORMAL HIGH (ref 0.0–100.0)

## 2014-12-14 LAB — GLUCOSE, CAPILLARY
Glucose-Capillary: 259 mg/dL — ABNORMAL HIGH (ref 65–99)
Glucose-Capillary: 155 mg/dL — ABNORMAL HIGH (ref 65–99)

## 2014-12-14 LAB — CBG MONITORING, ED: Glucose-Capillary: 298 mg/dL — ABNORMAL HIGH (ref 65–99)

## 2014-12-14 LAB — D-DIMER, QUANTITATIVE: D-Dimer, Quant: 0.68 ug/mL-FEU — ABNORMAL HIGH (ref 0.00–0.48)

## 2014-12-14 MED ORDER — POTASSIUM CHLORIDE CRYS ER 20 MEQ PO TBCR: 40.0000 meq | EXTENDED_RELEASE_TABLET | Freq: Every day | ORAL | Status: DC

## 2014-12-14 MED ORDER — SODIUM CHLORIDE 0.9 % IV SOLN: 250.0000 mL | INTRAVENOUS | Status: DC | PRN

## 2014-12-14 MED ORDER — INSULIN GLARGINE 100 UNIT/ML ~~LOC~~ SOLN
10.0000 [IU] | Freq: Every day | SUBCUTANEOUS | Status: DC
  Administered 2014-12-14 – 2014-12-15 (×2): 10 [IU] via SUBCUTANEOUS
  Filled 2014-12-14 (×3): qty 0.1

## 2014-12-14 MED ORDER — FUROSEMIDE 10 MG/ML IJ SOLN
80.0000 mg | Freq: Two times a day (BID) | INTRAMUSCULAR | Status: DC
  Administered 2014-12-15: 40 mg via INTRAVENOUS
  Filled 2014-12-14 (×2): qty 8

## 2014-12-14 MED ORDER — LISINOPRIL 5 MG PO TABS
5.0000 mg | ORAL_TABLET | Freq: Every day | ORAL | Status: DC
  Administered 2014-12-14 – 2014-12-16 (×3): 5 mg via ORAL
  Filled 2014-12-14 (×4): qty 1

## 2014-12-14 MED ORDER — DIGOXIN 125 MCG PO TABS
0.1250 mg | ORAL_TABLET | Freq: Every day | ORAL | Status: DC
  Administered 2014-12-15 – 2014-12-16 (×2): 0.125 mg via ORAL
  Filled 2014-12-14 (×2): qty 1

## 2014-12-14 MED ORDER — ONDANSETRON HCL 4 MG/2ML IJ SOLN: 4.0000 mg | Freq: Four times a day (QID) | INTRAMUSCULAR | Status: DC | PRN

## 2014-12-14 MED ORDER — METOPROLOL SUCCINATE ER 100 MG PO TB24
100.0000 mg | ORAL_TABLET | Freq: Two times a day (BID) | ORAL | Status: DC
  Administered 2014-12-14 – 2014-12-16 (×4): 100 mg via ORAL
  Filled 2014-12-14 (×5): qty 1

## 2014-12-14 MED ORDER — ACETAMINOPHEN 325 MG PO TABS: 650.0000 mg | ORAL_TABLET | ORAL | Status: DC | PRN

## 2014-12-14 MED ORDER — ASPIRIN 325 MG PO TABS
325.0000 mg | ORAL_TABLET | Freq: Every day | ORAL | Status: DC
  Administered 2014-12-15 – 2014-12-16 (×2): 325 mg via ORAL
  Filled 2014-12-14 (×2): qty 1

## 2014-12-14 MED ORDER — FUROSEMIDE 10 MG/ML IJ SOLN
80.0000 mg | Freq: Once | INTRAMUSCULAR | Status: AC
  Administered 2014-12-14: 40 mg via INTRAVENOUS
  Filled 2014-12-14: qty 8

## 2014-12-14 MED ORDER — INSULIN ASPART 100 UNIT/ML ~~LOC~~ SOLN
0.0000 [IU] | Freq: Three times a day (TID) | SUBCUTANEOUS | Status: DC
  Administered 2014-12-14: 8 [IU] via SUBCUTANEOUS
  Administered 2014-12-15 (×2): 3 [IU] via SUBCUTANEOUS
  Administered 2014-12-15: 2 [IU] via SUBCUTANEOUS

## 2014-12-14 MED ORDER — SODIUM CHLORIDE 0.9 % IJ SOLN
3.0000 mL | Freq: Two times a day (BID) | INTRAMUSCULAR | Status: DC
  Administered 2014-12-14 – 2014-12-16 (×4): 3 mL via INTRAVENOUS

## 2014-12-14 MED ORDER — ENOXAPARIN SODIUM 40 MG/0.4ML ~~LOC~~ SOLN
40.0000 mg | SUBCUTANEOUS | Status: DC
  Administered 2014-12-15: 40 mg via SUBCUTANEOUS
  Filled 2014-12-14 (×3): qty 0.4

## 2014-12-14 MED ORDER — SODIUM CHLORIDE 0.9 % IJ SOLN: 3.0000 mL | INTRAMUSCULAR | Status: DC | PRN

## 2014-12-14 NOTE — ED Notes (Signed)
EMS - Patient coming from home with c/o of afib with increased exertional dysnea x 3 days.  Patient was seen in the ED 3 days ago.  Alert and oriented x 4.

## 2014-12-14 NOTE — ED Notes (Signed)
CBG = 298  RN Toniann Fail informed of result.
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
## 2014-12-14 NOTE — ED Notes (Signed)
Pt used bedside commode without assistance. Pt returned to bed. Monitored by pulse ox, bp cuff, and 5-lead. Pt request for one bed rail to be left down so pt can use the restroom again later.

## 2014-12-14 NOTE — ED Notes (Signed)
PT given ice chips per Rozell Searing.

## 2014-12-14 NOTE — Care Management Note (Addendum)
Case Management Note  Patient Details  Name: Kaitlyn [name] MRN: [id number] Date of Birth: [birth year]  Subjective/Objective:    64 y.o F who is well known to the CM dept as she has been seen frequently to discuss her use of the ED as a PCP. (16 EDV/7 admits) since January.  She is noncompliant in switching her [place] Medicaid to [place] , has No showed for appts x 7 for scheduled follow up, and most recently refused appointment at the [facility] where she could be seen for her Chronic Atrial Fibrillation and for medication management.  Pt denied being called by [facility] and stated she was resistant to "any new MD" due to "transportation issues". Writer suggested we could look at solution to Transportation dilemma and pt became obviously upset. Stating she just wanted to work with the lady she had just been talking to and felt she was on the right track...Marland KitchenMarland KitchenMarland Kitchen              Action/Plan:Will defer for now as pt has been completely resistant and noncompliant with all assistance.    Expected Discharge Date:                  Expected Discharge Plan:     In-House Referral:     Discharge planning Services  CM Consult  Post Acute Care Choice:    Choice offered to:     DME Arranged:    DME Agency:     HH Arranged:    HH Agency:     Status of Service:  In process, will continue to follow  Medicare Important Message Given:    Date Medicare IM Given:    Medicare IM give by:    Date Additional Medicare IM Given:    Additional Medicare Important Message give by:     If discussed at Long Length of Stay Meetings, dates discussed:    Additional Comments:  Yvone Neu, RN 12/14/2014, 12:39 PM
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
## 2014-12-14 NOTE — Progress Notes (Signed)
CSW (Clinical Child psychotherapist) received consult for possible ALF placement due to frequent ED visits and hospitalizations. CSW very familiar with pt and pt history. Pt noncompliant with applying for [place] Medicaid. Pt has been given information and resources multiple times from CSWs and RNCMs during admissions to assist with MD appointments, medications, transportation, and other home resources. Pt is aware that applying for [place] Medicaid would greatly assist with current transportation issues. Without adequate insurance, ALF placement will not be an option. Unable to assist pt with any further needs at this time. Please call should pt need transportation assistance at discharge. CSW signing off.  Kaitlyn [name] Weekend CSW [phone]
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
## 2014-12-15 DIAGNOSIS — R569 Unspecified convulsions: Secondary | ICD-10-CM

## 2014-12-15 DIAGNOSIS — E1165 Type 2 diabetes mellitus with hyperglycemia: Secondary | ICD-10-CM | POA: Diagnosis not present

## 2014-12-15 DIAGNOSIS — I4891 Unspecified atrial fibrillation: Secondary | ICD-10-CM | POA: Diagnosis not present

## 2014-12-15 DIAGNOSIS — I10 Essential (primary) hypertension: Secondary | ICD-10-CM | POA: Diagnosis not present

## 2014-12-15 DIAGNOSIS — Z9119 Patient's noncompliance with other medical treatment and regimen: Secondary | ICD-10-CM | POA: Diagnosis not present

## 2014-12-15 DIAGNOSIS — I5023 Acute on chronic systolic (congestive) heart failure: Secondary | ICD-10-CM | POA: Diagnosis not present

## 2014-12-15 LAB — CBC
HCT: 35.9 % — ABNORMAL LOW (ref 36.0–46.0)
Hemoglobin: 11.3 g/dL — ABNORMAL LOW (ref 12.0–15.0)
MCH: 32.5 pg (ref 26.0–34.0)
MCHC: 31.5 g/dL (ref 30.0–36.0)
MCV: 103.2 fL — ABNORMAL HIGH (ref 78.0–100.0)
Platelets: 191 10*3/uL (ref 150–400)
RBC: 3.48 MIL/uL — ABNORMAL LOW (ref 3.87–5.11)
RDW: 15.7 % — ABNORMAL HIGH (ref 11.5–15.5)
WBC: 8.3 10*3/uL (ref 4.0–10.5)

## 2014-12-15 LAB — GLUCOSE, CAPILLARY
Glucose-Capillary: 160 mg/dL — ABNORMAL HIGH (ref 65–99)
Glucose-Capillary: 139 mg/dL — ABNORMAL HIGH (ref 65–99)
Glucose-Capillary: 134 mg/dL — ABNORMAL HIGH (ref 65–99)
Glucose-Capillary: 165 mg/dL — ABNORMAL HIGH (ref 65–99)
Glucose-Capillary: 146 mg/dL — ABNORMAL HIGH (ref 65–99)

## 2014-12-15 LAB — URINE CULTURE: Culture: NO GROWTH

## 2014-12-15 LAB — BASIC METABOLIC PANEL
Anion gap: 8 (ref 5–15)
BUN: 18 mg/dL (ref 6–20)
CO2: 26 mmol/L (ref 22–32)
Calcium: 8.8 mg/dL — ABNORMAL LOW (ref 8.9–10.3)
Chloride: 105 mmol/L (ref 101–111)
Creatinine, Ser: 1.04 mg/dL — ABNORMAL HIGH (ref 0.44–1.00)
GFR calc Af Amer: >60 mL/min (ref >60)
GFR calc non Af Amer: 56 mL/min — ABNORMAL LOW (ref >60)
Glucose, Bld: 206 mg/dL — ABNORMAL HIGH (ref 65–99)
Potassium: 4 mmol/L (ref 3.5–5.1)
Sodium: 139 mmol/L (ref 135–145)

## 2014-12-15 MED ORDER — INSULIN ASPART 100 UNIT/ML ~~LOC~~ SOLN
0.0000 [IU] | Freq: Three times a day (TID) | SUBCUTANEOUS | Status: DC
  Administered 2014-12-16: 3 [IU] via SUBCUTANEOUS
  Administered 2014-12-16: 2 [IU] via SUBCUTANEOUS

## 2014-12-15 MED ORDER — FUROSEMIDE 10 MG/ML IJ SOLN
40.0000 mg | Freq: Two times a day (BID) | INTRAMUSCULAR | Status: DC
  Administered 2014-12-16: 40 mg via INTRAVENOUS
  Filled 2014-12-15 (×2): qty 4

## 2014-12-15 NOTE — Progress Notes (Addendum)
[facility] PROGRESS NOTE Interim History: 64 yo female with chronic systolic CHF and EF 20 - 25% per last 2 D ECHO in May 2016, now presented with dyspnea. She has been admitted for acute on chronic systolic CHF management and cardiology team was consulted for assistance. Multiple admissions and ED visits due to medication compliance and diet indiscretion.  Filed Weights   12/14/14 0925 12/14/14 1606 12/15/14 0248  Weight: 123.378 kg (272 lb) 125.1 kg (275 lb 12.7 oz) 126.3 kg (278 lb 7.1 oz)        Intake/Output Summary (Last 24 hours) at 12/15/14 1206 Last data filed at 12/15/14 1200  Gross per 24 hour  Intake   1020 ml  Output   3400 ml  Net  -2380 ml     Assessment/Plan:  Acute on chronic congestive heart failure In May 2-D echo showed 20-25% EF with diffuse hypokinesis. Will continue IV lasix, strict I's and O's, low sodium diet, daily weights (patient refuse to take dose recommended by cardiology; will compromise on at least  BID. Which would be better than nothing) Will continue lisinopril, metoprolol, digoxin. Patient needs medication compliance and also low sodium diet Education provided about compliance and low sodium diet   A. fib with RVR Patient is not on anticoagulation likely due to noncompliance and high risk of falls. Will continue rate control with metoprolol and digoxine Continue full dose ASA  Uncontrolled type 2 diabetes Patient states she is currently not taking any medications Will follow A1C Continue low carb diet Continue current hypoglycemic regimen for now  History of seizures Patient states she has not had any seizures since she was in her 19s and is not on anti-seizure medications. Will monitor  Social  Patient is apparently unable to care for self at home and has proven to be unable to be compliant with medications No insurance here for ALF Patient with capacity on exam. No SI or hallucinations  Code Status: Full Family  Communication: no family at bedside Disposition Plan: remains inpatient. Continue IV lasix    Consultants:  Cardiology   Procedures: ECHO: 10/10/14 - Left ventricle: The cavity size was severely dilated. Wall thickness was increased in a pattern of mild LVH. Systolic function was severely reduced. The estimated ejection fraction was in the range of 20% to 25%. Diffuse hypokinesis. - Mitral valve: There was mild regurgitation. - Left atrium: The atrium was moderately dilated. - Right atrium: The atrium was mildly dilated.   Antibiotics:  None   HPI/Subjective: Feeling much better and breathing easier. Good diuresis overnight (over 2L)  Objective: Filed Vitals:   12/15/14 0248 12/15/14 0500 12/15/14 1111 12/15/14 1112  BP:  131/82 114/75 114/75  Pulse:  95  96  Temp:  97.4 F (36.3 C)    TempSrc:  Oral    Resp:  20    Height:      Weight: 126.3 kg (278 lb 7.1 oz)     SpO2:  96%       Exam:  General: Alert, awake, oriented x3, in no acute distress. Denies CP. Frustrated for her situation and understand she needs to modified her life style and be compliant with medications.  HEENT: No bruits, no goiter; positive mild JVD Heart: Regular rate, no rubs or gallops, no murmurs  Lungs: good air movement, no frank crackles appreciated on exam; no using accessory muscles Abdomen: Soft, nontender, nondistended, positive bowel sounds.  Neuro: Grossly intact, nonfocal.   Data Reviewed: Basic Metabolic Panel:  Recent Labs Lab 12/09/14 1314 12/14/14 0950 12/15/14 0253  NA 136 137 139  K 3.9 4.2 4.0  CL 108 108 105  CO2 22 22 26   GLUCOSE 277* 325* 206*  BUN 14 16 18   CREATININE 0.81 0.94 1.04*  CALCIUM 8.5* 8.8* 8.8*   CBC:  Recent Labs Lab 12/09/14 1314 12/14/14 0950 12/15/14 0253  WBC 9.3 10.9* 8.3  HGB 11.9* 11.9* 11.3*  HCT 36.4 36.4 35.9*  MCV 103.4* 102.8* 103.2*  PLT 207 208 191   BNP (last 3 results)  Recent Labs  11/22/14 0604  12/09/14 1347 12/14/14 0950  BNP 938.8* 959.6* 1314.5*    ProBNP (last 3 results)  Recent Labs  03/21/14 2126 03/22/14 1312 04/27/14 0128  PROBNP 2946.0* 3181.0* 5351.0*    CBG:  Recent Labs Lab 12/14/14 1647 12/14/14 2118 12/15/14 0636 12/15/14 0738 12/15/14 1127  GLUCAP 259* 155* 160* 139* 134*    Recent Results (from the past 240 hour(s))  Urine culture     Status: None   Collection Time: 12/14/14 11:03 AM  Result Value Ref Range Status   Specimen Description URINE, CLEAN CATCH  Final   Special Requests NONE  Final   Culture NO GROWTH 1 DAY  Final   Report Status 12/15/2014 FINAL  Final     Studies: Dg Chest 2 View  12/14/2014   CLINICAL DATA:  Shortness of breath and tachycardia for 2 days. History of atrial fibrillation, diabetes and hypertension.  EXAM: CHEST  2 VIEW  COMPARISON:  12/09/2014; 12/05/2014; 09/19/2014; chest CT - 09/24/2014  FINDINGS: Grossly unchanged enlarged cardiac silhouette and mediastinal contours. Overall improved aeration of lungs with persistent bibasilar heterogeneous opacities, likely atelectasis. No new focal airspace opacities. No pleural effusion or pneumothorax. There is persistent pleural parenchymal thickening about the bilateral major fissures. Mild pulmonary is congestion without frank evidence of edema. No acute osseus abnormalities.  IMPRESSION: Similar findings of cardiomegaly and pulmonary venous congestion without evidence of edema.   Electronically Signed   By: Simonne Come M.D.   On: 12/14/2014 11:03    Scheduled Meds: . aspirin  325 mg Oral Daily  . digoxin  0.125 mg Oral Daily  . enoxaparin (LOVENOX) injection  40 mg Subcutaneous Q24H  . furosemide  80 mg Intravenous BID  . insulin aspart  0-15 Units Subcutaneous TID WC  . insulin glargine  10 Units Subcutaneous QHS  . lisinopril  5 mg Oral Daily  . metoprolol succinate  100 mg Oral BID  . sodium chloride  3 mL Intravenous Q12H   Continuous Infusions:    Vassie Loll  [facility] Pager [phone]. If 7PM-7AM, please contact night-coverage at [email or web address], password [email or web address] 12/15/2014, 12:06 PM  LOS: 0 days

## 2014-12-15 NOTE — Care Management Note (Addendum)
Case Management Note  Patient Details  Name: Kaitlyn [name] MRN: [id number] Date of Birth: Mar 29, 1951  Subjective/Objective:                   CHF, atrial fibrillation Action/Plan: Discharge planning  Expected Discharge Date:                  Expected Discharge Plan:  Home/Self Care  In-House Referral:     Discharge planning Services  CM Consult, [facility]  Post Acute Care Choice:    Choice offered to:     DME Arranged:    DME Agency:     HH Arranged:    [place] Agency:     Status of Service:  Completed, signed off  Medicare Important Message Given:    Date Medicare IM Given:    Medicare IM give by:    Date Additional Medicare IM Given:    Additional Medicare Important Message give by:     If discussed at [place] of Stay Meetings, dates discussed:    Additional Comments: CM met with pt in room and gave pt ANOTHER [place] pamphlet (she has received numerous pamphlets in the past) and gave pt a [employer] [place] resource number to get an appt with a Navigator to secure insurance (she has received insurance resources numerous times in the past).  Pt states she does not want to be locked into an appt. Pt has exhausted [place].  CM made referral to Development worker, community.  Dellie Catholic, RN 12/15/2014, 11:01 AM

## 2014-12-15 NOTE — Progress Notes (Signed)
Patient refuses to take the prescribe amount of lasix (40mg  bid). She only consents to 40mg  once daily. Notified Md [place].

## 2014-12-15 NOTE — Progress Notes (Signed)
SUBJECTIVE: "Feeling much better". Frustrated with her medication noncompliance.     Intake/Output Summary (Last 24 hours) at 12/15/14 0910 Last data filed at 12/15/14 0500  Gross per 24 hour  Intake    780 ml  Output   2800 ml  Net  -2020 ml    Current Facility-Administered Medications  Medication Dose Route Frequency Provider Last Rate Last Dose  . 0.9 %  sodium chloride infusion  250 mL Intravenous PRN Stephani Police, PA-C      . acetaminophen (TYLENOL) tablet 650 mg  650 mg Oral Q4H PRN Stephani Police, PA-C      . aspirin tablet 325 mg  325 mg Oral Daily Stephani Police, PA-C      . digoxin (LANOXIN) tablet 0.125 mg  0.125 mg Oral Daily Tora [facility] [place], PA-C      . enoxaparin (LOVENOX) injection 40 mg  40 mg Subcutaneous Q24H Stephani Police, PA-C   40 mg at 12/14/14 1800  . furosemide (LASIX) injection 80 mg  80 mg Intravenous BID Stephani Police, PA-C   80 mg at 12/14/14 1800  . insulin aspart (novoLOG) injection 0-15 Units  0-15 Units Subcutaneous TID WC Stephani Police, PA-C   3 Units at 12/15/14 9407  . insulin glargine (LANTUS) injection 10 Units  10 Units Subcutaneous QHS Stephani Police, PA-C   10 Units at 12/14/14 2227  . lisinopril (PRINIVIL,ZESTRIL) tablet 5 mg  5 mg Oral Daily Stephani Police, PA-C   5 mg at 12/14/14 1700  . metoprolol succinate (TOPROL-XL) 24 hr tablet 100 mg  100 mg Oral BID Stephani Police, PA-C   100 mg at 12/14/14 2225  . ondansetron (ZOFRAN) injection 4 mg  4 mg Intravenous Q6H PRN Stephani Police, PA-C      . sodium chloride 0.9 % injection 3 mL  3 mL Intravenous Q12H Stephani Police, PA-C   3 mL at 12/14/14 2236  . sodium chloride 0.9 % injection 3 mL  3 mL Intravenous PRN Stephani Police, PA-C        Filed Vitals:   12/14/14 1606 12/14/14 2044 12/15/14 0248 12/15/14 0500  BP: 114/84 125/85  131/82  Pulse: 99 93  95  Temp: 97.3 F (36.3 C) 97.7 F (36.5 C)  97.4 F (36.3 C)  TempSrc: Oral Oral  Oral  Resp: 18 20  20     Height: 5\' 11"  (1.803 m)     Weight: 275 lb 12.7 oz (125.1 kg)  278 lb 7.1 oz (126.3 kg)   SpO2: 94% 97%  96%    PHYSICAL EXAM General: No distress. Neck: JVP 10-12 cm.  Lungs: Clear to auscultation bilaterally with normal respiratory effort. CV: Irregular rhythm, normal rate, normal S1/S2, no S3, no murmur. 1-2+ pitting pretibial edema b/l.  Abdomen: Soft, nontender, obese, no distention.  Neurologic: Alert and oriented x 3.  Psych: Normal affect. Poor insight. Extremities: No clubbing or cyanosis.  HEENT: Normal.    TELEMETRY: Reviewed telemetry pt in atrial fibrillation, HR 90's.  LABS: Basic Metabolic Panel:  Recent Labs  68/08/81 0950 12/15/14 0253  NA 137 139  K 4.2 4.0  CL 108 105  CO2 22 26  GLUCOSE 325* 206*  BUN 16 18  CREATININE 0.94 1.04*  CALCIUM 8.8* 8.8*   Liver Function Tests: No results for input(s): AST, ALT, ALKPHOS, BILITOT, PROT, ALBUMIN in the last 72 hours. No results for input(s): LIPASE, AMYLASE  in the last 72 hours. CBC:  Recent Labs  12/14/14 0950 12/15/14 0253  WBC 10.9* 8.3  HGB 11.9* 11.3*  HCT 36.4 35.9*  MCV 102.8* 103.2*  PLT 208 191   Cardiac Enzymes: No results for input(s): CKTOTAL, CKMB, CKMBINDEX, TROPONINI in the last 72 hours. BNP: Invalid input(s): POCBNP D-Dimer:  Recent Labs  12/14/14 0950  DDIMER 0.68*   Hemoglobin A1C: No results for input(s): HGBA1C in the last 72 hours. Fasting Lipid Panel: No results for input(s): CHOL, HDL, LDLCALC, TRIG, CHOLHDL, LDLDIRECT in the last 72 hours. Thyroid Function Tests: No results for input(s): TSH, T4TOTAL, T3FREE, THYROIDAB in the last 72 hours.  Invalid input(s): FREET3 Anemia Panel: No results for input(s): VITAMINB12, FOLATE, FERRITIN, TIBC, IRON, RETICCTPCT in the last 72 hours.  RADIOLOGY: Dg Chest 2 View  12/14/2014   CLINICAL DATA:  Shortness of breath and tachycardia for 2 days. History of atrial fibrillation, diabetes and hypertension.  EXAM:  CHEST  2 VIEW  COMPARISON:  12/09/2014; 12/05/2014; 09/19/2014; chest CT - 09/24/2014  FINDINGS: Grossly unchanged enlarged cardiac silhouette and mediastinal contours. Overall improved aeration of lungs with persistent bibasilar heterogeneous opacities, likely atelectasis. No new focal airspace opacities. No pleural effusion or pneumothorax. There is persistent pleural parenchymal thickening about the bilateral major fissures. Mild pulmonary is congestion without frank evidence of edema. No acute osseus abnormalities.  IMPRESSION: Similar findings of cardiomegaly and pulmonary venous congestion without evidence of edema.   Electronically Signed   By: Simonne Come M.D.   On: 12/14/2014 11:03   Dg Chest 2 View  12/09/2014   CLINICAL DATA:  Shortness of breath for 1 day  EXAM: CHEST  2 VIEW  COMPARISON:  12/05/2014  FINDINGS: Moderate enlargement of the cardiac silhouette is noted with central vascular congestion and possible early interstitial edema. No focal pulmonary opacity. Trace pleural fluid. No acute osseous abnormality. Stable prominence of the superior vascular pedicle.  IMPRESSION: Cardiomegaly with borderline interstitial edema and trace pleural effusions.   Electronically Signed   By: Christiana Pellant M.D.   On: 12/09/2014 13:30   Dg Chest 2 View  12/05/2014   CLINICAL DATA:  64 year old female with shortness of breath  EXAM: CHEST  2 VIEW  COMPARISON:  Radiograph dated 11/27/2014  FINDINGS: Two views of the chest demonstrate clear lungs. There is mild blunting of the right costophrenic angle which may represent subsegmental atelectasis versus small pleural effusion. Stable cardiac silhouette. The osseous structures are grossly unremarkable.  IMPRESSION: No focal consolidation.   Electronically Signed   By: Elgie Collard M.D.   On: 12/05/2014 20:15   Dg Chest 2 View  11/27/2014   CLINICAL DATA:  64 year old female with severe shortness of breath for the past 2 days.  EXAM: CHEST  2 VIEW   COMPARISON:  Prior chest x-ray 11/22/2014  FINDINGS: Stable enlargement of the cardiopericardial silhouette. Interval increase in pulmonary vascular congestion now with mild interstitial edema. Kerley B-lines are noted in the periphery. Mild dependent bibasilar atelectasis. No pneumothorax. No acute osseous abnormality.  IMPRESSION: 1. Radiographic findings are most consistent with mild CHF. 2. Stable cardiomegaly with increased pulmonary vascular congestion and mild interstitial edema.   Electronically Signed   By: Malachy Moan M.D.   On: 11/27/2014 12:57   Dg Chest Port 1 View  11/22/2014   CLINICAL DATA:  Shortness of breath  EXAM: PORTABLE CHEST - 1 VIEW  COMPARISON:  11/05/2014  FINDINGS: Chronic cardiopericardial enlargement and vascular pedicle widening. The aorta  is chronically tortuous.  Interstitial coarsening at the bases without overt pulmonary edema or pleural effusion. Cephalized blood flow. No indication of pneumonia. No air leak.  IMPRESSION: Cardiomegaly and pulmonary venous congestion.   Electronically Signed   By: Marnee Spring M.D.   On: 11/22/2014 06:19      ASSESSMENT AND PLAN: 1. Acute on chronic systolic heart failure (EF 20-25% on 10/10/14): Due to medication noncompliance. Provided extensive education on importance of both dietary and medication noncompliance. Explained negative inotropic effects of diltiazem which she was appreciative of, and also importance of low-sodium diet. Social work involved. Doubt PE, as mildly elevated d-dimer can be seen in CHF as well as poorly controlled diabetes. Continue outpatient digoxin, lisinopril, and metoprolol succinate dosages. Over 2 L output on IV Lasix 80 mg bid which I will continue.  Also emphasized importance of following up in our office as outpatient.  2. Atrial fibrillation: In spite of elevated thromboembolic risk with CHA2DS2VASC score of 4, poor anticoagulation candidate due to noncompliance and inability to afford novel  oral anticoagulants at this time (had been prescribed Eliquis in past). Continue ASA 325 mg daily for now but could reconsider in future once SW/Medicaid involved. Continue Toprol-XL and digoxin for rate control.  3. Poorly controlled diabetes: Dietary counseling given. IM to manage medications.  4. Social: Patient well known to SW service. Has been noncompliant with applying for Medicaid. Does not have insurance for ALF coverage.   Prentice Docker, M.D., F.A.C.C.

## 2014-12-15 NOTE — Progress Notes (Signed)
Patient refuses to take the prescribe amount of lasix (80mg  bid). She only consents to 40mg  once daily. Notified Md [place].

## 2014-12-16 DIAGNOSIS — I5023 Acute on chronic systolic (congestive) heart failure: Secondary | ICD-10-CM | POA: Diagnosis not present

## 2014-12-16 DIAGNOSIS — Z9119 Patient's noncompliance with other medical treatment and regimen: Secondary | ICD-10-CM | POA: Diagnosis not present

## 2014-12-16 DIAGNOSIS — E1165 Type 2 diabetes mellitus with hyperglycemia: Secondary | ICD-10-CM | POA: Diagnosis not present

## 2014-12-16 DIAGNOSIS — I4891 Unspecified atrial fibrillation: Secondary | ICD-10-CM | POA: Diagnosis not present

## 2014-12-16 DIAGNOSIS — I10 Essential (primary) hypertension: Secondary | ICD-10-CM | POA: Diagnosis not present

## 2014-12-16 LAB — BASIC METABOLIC PANEL
Anion gap: 6 (ref 5–15)
BUN: 18 mg/dL (ref 6–20)
CO2: 26 mmol/L (ref 22–32)
Calcium: 8.5 mg/dL — ABNORMAL LOW (ref 8.9–10.3)
Chloride: 105 mmol/L (ref 101–111)
Creatinine, Ser: 1.01 mg/dL — ABNORMAL HIGH (ref 0.44–1.00)
GFR calc Af Amer: >60 mL/min (ref >60)
GFR calc non Af Amer: 58 mL/min — ABNORMAL LOW (ref >60)
Glucose, Bld: 185 mg/dL — ABNORMAL HIGH (ref 65–99)
Potassium: 3.8 mmol/L (ref 3.5–5.1)
Sodium: 137 mmol/L (ref 135–145)

## 2014-12-16 LAB — HEMOGLOBIN A1C
Hgb A1c MFr Bld: 9.4 % — ABNORMAL HIGH (ref 4.8–5.6)
Mean Plasma Glucose: 223 mg/dL

## 2014-12-16 LAB — GLUCOSE, CAPILLARY
Glucose-Capillary: 142 mg/dL — ABNORMAL HIGH (ref 65–99)
Glucose-Capillary: 162 mg/dL — ABNORMAL HIGH (ref 65–99)
Glucose-Capillary: 160 mg/dL — ABNORMAL HIGH (ref 65–99)

## 2014-12-16 MED ORDER — GLIPIZIDE 5 MG PO TABS: 5.0000 mg | ORAL_TABLET | Freq: Every day | ORAL | Status: DC

## 2014-12-16 MED ORDER — METOPROLOL SUCCINATE ER 100 MG PO TB24: 100.0000 mg | ORAL_TABLET | Freq: Two times a day (BID) | ORAL | Status: DC

## 2014-12-16 MED ORDER — LIVING WELL WITH DIABETES BOOK
Freq: Once | Status: AC
  Administered 2014-12-16: 13:00:00
  Filled 2014-12-16: qty 1

## 2014-12-16 NOTE — Progress Notes (Signed)
Patient being discharged, cardiac monitor discontinued, CCMD notified. Home discharge instructions discussed with patient. Discussed diet, activity, medications and follow up appt. Discussed with patient importance of going to follow up appts, patient verbally understands. Prescriptions for Glipizide and Metoprolol sent electronically via MD. Patient verbally understands instructions.

## 2014-12-16 NOTE — Discharge Summary (Signed)
Physician Discharge Summary  Kaitlyn [name] UJW:[id number] DOB: 10/28/1950 DOA: 12/14/2014  PCP: No PCP Per Patient  Admit date: 12/14/2014 Discharge date: 12/16/2014  Time spent: >30 minutes  Recommendations for Outpatient Follow-up:  1. Close follow up to CBG's and further adjustments to hypoglycemic regimen 2. Check BMET to follow electrolytes and renal function  Discharge Diagnoses:  Principal Problem:   Acute on chronic congestive heart failure Active Problems:   Diabetes mellitus type 2, uncontrolled   H/O noncompliance with medical treatment, presenting hazards to health   Atrial fibrillation with RVR   Essential hypertension   Acute on chronic systolic CHF (congestive heart failure)   Seizures   Shortness of breath   CHF (congestive heart failure), NYHA class IV   Discharge Condition: stable and improved. Advise to be compliant with medications, low sodium diet and follow up visit.  Diet recommendation: low sodium and low carbohydrates  Filed Weights   12/14/14 1606 12/15/14 0248 12/16/14 0444  Weight: 125.1 kg (275 lb 12.7 oz) 126.3 kg (278 lb 7.1 oz) 124.104 kg (273 lb 9.6 oz)    History of present illness:  64 yo female with chronic systolic CHF and EF 20 - 25% per last 2 D ECHO in May 2016, who  presented with dyspnea and worsening LE swelling. Well known to the service due to multiple admissions and ED visits due to medication non-compliance and diet indiscretion. No CP, no fever, no cough, no dysuria.  Hospital Course:  Acute on chronic congestive heart failure -In May 2-D echo showed 20-25% EF with diffuse hypokinesis. -Will discharge on lasix 60mg  daily  -Encourage to be compliant with low sodium diet, daily weights and follow up appointments -Will continue lisinopril, metoprolol, digoxin. -appointment made at HF clinic for further follow up and meds adjustments  A. fib with RVR -Patient is not on anticoagulation likely due to noncompliance and high risk  of falls. -Will continue rate control with metoprolol and digoxine -Continue full dose ASA  Uncontrolled type 2 diabetes -Patient states she is currently not taking any medications -A1C 9.4 -Continue low carb diet -will discharge on glipizide -further adjustments to be done in outpatient setting during follow up appointments   History of seizures -Patient states she has not had any seizures since she was in her 37s and is not on anti-seizure medications. -no seizure appreciated during hospitalization   Social  -Patient is apparently unable to care for self at home and has proven to be unable to be compliant with medications -No State insurance for ALF; patient declining SNF -Patient with capacity on exam. No SI or hallucinations  Procedures: ECHO: Last done on 10/10/14 - Left ventricle: The cavity size was severely dilated. Wall thickness was increased in a pattern of mild LVH. Systolic function was severely reduced. The estimated ejection fraction was in the range of 20% to 25%. Diffuse hypokinesis. - Mitral valve: There was mild regurgitation. - Left atrium: The atrium was moderately dilated. - Right atrium: The atrium was mildly dilated.  Consultations:  Cardiology   Discharge Exam: Filed Vitals:   12/16/14 0857  BP: 113/73  Pulse: 101  Temp: 98.2 F (36.8 C)  Resp: 17   General: Alert, awake, oriented x3, in no acute distress. Denies CP and SOB. Patient wants to go home.  HEENT: No bruits, no goiter; no JVD Heart: Regular rate, no rubs or gallops, no murmurs ; 1+ LE edema bilaterally Lungs: good air movement, no frank crackles appreciated on exam; no using accessory  muscles and good O2 sat on RA Abdomen: Soft, nontender, nondistended, positive bowel sounds.  Neuro: Grossly intact, nonfocal.   Discharge Instructions   Discharge Instructions    Diet - low sodium heart healthy    Complete by:  As directed      Discharge instructions    Complete by:  As  directed   Follow with [facility] and heart failure clinic for further medications adjustment Please be compliant with low sodium diet, daily weight and taking your medications          Current Discharge Medication List    CONTINUE these medications which have NOT CHANGED   Details  aspirin 325 MG tablet Take 1 tablet (325 mg total) by mouth daily.    digoxin (LANOXIN) 0.125 MG tablet Take 1 tablet (0.125 mg total) by mouth daily. Qty: 30 tablet, Refills: 0    furosemide (LASIX) 40 MG tablet Take 1.5 tablets (60 mg total) by mouth daily. Qty: 30 tablet, Refills: 3    lisinopril (PRINIVIL,ZESTRIL) 5 MG tablet Take 1 tablet (5 mg total) by mouth daily. Qty: 30 tablet, Refills: 3    metoprolol succinate (TOPROL-XL) 100 MG 24 hr tablet Take 1 tablet (100 mg total) by mouth 2 (two) times daily. Take with or immediately following a meal. Qty: 60 tablet, Refills: 0    glipiZIDE (GLUCOTROL) 5 MG tablet Take 1 tablet (5 mg total) by mouth daily before breakfast. Qty: 30 tablet, Refills: 0    potassium chloride SA (K-DUR,KLOR-CON) 20 MEQ tablet Take 2 tablets (40 mEq total) by mouth daily. Qty: 60 tablet, Refills: 0       Allergies  Allergen Reactions  . Caffeine Other (See Comments)    Seizure  . Penicillins Other (See Comments)    Unknown childhood allergy   Follow-up Information    Follow up with [facility]    . Go on 12/20/2014.   Why:  at 11:00am.    Contact information:   [address] [place] [address] [phone]      Follow up with [facility], NP. Go on 12/31/2014.   Specialty:  Nurse Practitioner   Why:  at 1120am in the Advanced [facility] Failure [facility]--Gate code 0008--please bring all medications to appt   Contact information:   [address] [place] [address] [phone]       The results of significant diagnostics from this hospitalization (including imaging, microbiology, ancillary and  laboratory) are listed below for reference.    Significant Diagnostic Studies: Dg Chest 2 View  12/14/2014   CLINICAL DATA:  Shortness of breath and tachycardia for 2 days. History of atrial fibrillation, diabetes and hypertension.  EXAM: CHEST  2 VIEW  COMPARISON:  12/09/2014; 12/05/2014; 09/19/2014; chest CT - 09/24/2014  FINDINGS: Grossly unchanged enlarged cardiac silhouette and mediastinal contours. Overall improved aeration of lungs with persistent bibasilar heterogeneous opacities, likely atelectasis. No new focal airspace opacities. No pleural effusion or pneumothorax. There is persistent pleural parenchymal thickening about the bilateral major fissures. Mild pulmonary is congestion without frank evidence of edema. No acute osseus abnormalities.  IMPRESSION: Similar findings of cardiomegaly and pulmonary venous congestion without evidence of edema.   Electronically Signed   By: Simonne Come M.D.   On: 12/14/2014 11:03   Dg Chest 2 View  12/09/2014   CLINICAL DATA:  Shortness of breath for 1 day  EXAM: CHEST  2 VIEW  COMPARISON:  12/05/2014  FINDINGS: Moderate enlargement of the  cardiac silhouette is noted with central vascular congestion and possible early interstitial edema. No focal pulmonary opacity. Trace pleural fluid. No acute osseous abnormality. Stable prominence of the superior vascular pedicle.  IMPRESSION: Cardiomegaly with borderline interstitial edema and trace pleural effusions.   Electronically Signed   By: Christiana Pellant M.D.   On: 12/09/2014 13:30   Dg Chest 2 View  12/05/2014   CLINICAL DATA:  64 year old female with shortness of breath  EXAM: CHEST  2 VIEW  COMPARISON:  Radiograph dated 11/27/2014  FINDINGS: Two views of the chest demonstrate clear lungs. There is mild blunting of the right costophrenic angle which may represent subsegmental atelectasis versus small pleural effusion. Stable cardiac silhouette. The osseous structures are grossly unremarkable.  IMPRESSION: No focal  consolidation.   Electronically Signed   By: Elgie Collard M.D.   On: 12/05/2014 20:15   Dg Chest 2 View  11/27/2014   CLINICAL DATA:  64 year old female with severe shortness of breath for the past 2 days.  EXAM: CHEST  2 VIEW  COMPARISON:  Prior chest x-ray 11/22/2014  FINDINGS: Stable enlargement of the cardiopericardial silhouette. Interval increase in pulmonary vascular congestion now with mild interstitial edema. Kerley B-lines are noted in the periphery. Mild dependent bibasilar atelectasis. No pneumothorax. No acute osseous abnormality.  IMPRESSION: 1. Radiographic findings are most consistent with mild CHF. 2. Stable cardiomegaly with increased pulmonary vascular congestion and mild interstitial edema.   Electronically Signed   By: Malachy Moan M.D.   On: 11/27/2014 12:57   Dg Chest Port 1 View  11/22/2014   CLINICAL DATA:  Shortness of breath  EXAM: PORTABLE CHEST - 1 VIEW  COMPARISON:  11/05/2014  FINDINGS: Chronic cardiopericardial enlargement and vascular pedicle widening. The aorta is chronically tortuous.  Interstitial coarsening at the bases without overt pulmonary edema or pleural effusion. Cephalized blood flow. No indication of pneumonia. No air leak.  IMPRESSION: Cardiomegaly and pulmonary venous congestion.   Electronically Signed   By: Marnee Spring M.D.   On: 11/22/2014 06:19    Microbiology: Recent Results (from the past 240 hour(s))  Urine culture     Status: None   Collection Time: 12/14/14 11:03 AM  Result Value Ref Range Status   Specimen Description URINE, CLEAN CATCH  Final   Special Requests NONE  Final   Culture NO GROWTH 1 DAY  Final   Report Status 12/15/2014 FINAL  Final     Labs: Basic Metabolic Panel:  Recent Labs Lab 12/14/14 0950 12/15/14 0253 12/16/14 0333  NA 137 139 137  K 4.2 4.0 3.8  CL 108 105 105  CO2 22 26 26   GLUCOSE 325* 206* 185*  BUN 16 18 18   CREATININE 0.94 1.04* 1.01*  CALCIUM 8.8* 8.8* 8.5*   CBC:  Recent Labs Lab  12/14/14 0950 12/15/14 0253  WBC 10.9* 8.3  HGB 11.9* 11.3*  HCT 36.4 35.9*  MCV 102.8* 103.2*  PLT 208 191   BNP (last 3 results)  Recent Labs  11/22/14 0604 12/09/14 1347 12/14/14 0950  BNP 938.8* 959.6* 1314.5*    ProBNP (last 3 results)  Recent Labs  03/21/14 2126 03/22/14 1312 04/27/14 0128  PROBNP 2946.0* 3181.0* 5351.0*    CBG:  Recent Labs Lab 12/15/14 1127 12/15/14 1616 12/15/14 2213 12/16/14 0618 12/16/14 1127  GLUCAP 134* 165* 146* 142* 162*    Signed:  Vassie Loll  [facility] 12/16/2014, 1:46 PM

## 2014-12-16 NOTE — Progress Notes (Signed)
Nutrition Education Note  RD consulted for nutrition education regarding CHF.  RD provided "Low Sodium Nutrition Therapy" handout from the Academy of Nutrition and Dietetics. Reviewed patient's dietary recall. Provided examples on ways to decrease sodium intake in diet. Discouraged intake of processed foods and use of salt shaker. Encouraged fresh fruits and vegetables as well as whole grain sources of carbohydrates to maximize fiber intake.   RD discussed why it is important for patient to adhere to diet recommendations, and emphasized the role of fluids, foods to avoid, and importance of weighing self daily. Teach back method used.  Expect good compliance.  Body mass index is 38.18 kg/(m^2). Pt meets criteria for Obesity based on current BMI.  Current diet order is Low Sodium Heart Healthy, patient is consuming approximately 100% of meals at this time. Labs and medications reviewed. No further nutrition interventions warranted at this time. RD contact information provided. If additional nutrition issues arise, please re-consult RD.   Ian Malkin RD, LDN Inpatient Clinical Dietitian Pager: [phone] After Hours Pager: [phone]

## 2014-12-16 NOTE — Progress Notes (Signed)
CSW notified by nursing that patient had d/c orders and needs EMS transport home. She does not have any family in the area and is unable to drive. Due to her medical conditions and need for home 02- EMS transport arranged for her with [facility]. Nursing notified of arrangements.  No further CSW needs identified. CSW signing off.  Lorri Frederick. Jaci Lazier, [place] [phone]

## 2014-12-16 NOTE — Progress Notes (Signed)
Utilization review completed. Amory Zbikowski, RN, BSN. 

## 2014-12-16 NOTE — Progress Notes (Signed)
Inpatient Diabetes Program Recommendations  AACE/ADA: New Consensus Statement on Inpatient Glycemic Control (2013)  Target Ranges:  Prepandial:   less than 140 mg/dL      Peak postprandial:   less than 180 mg/dL (1-2 hours)      Critically ill patients:  140 - 180 mg/dL   Reason for Visit: Consult for education and recommendations.  Diabetes history: Type 2 Outpatient Diabetes medications: Glucotrol 5 mg ordered but not taking Current orders for Inpatient glycemic control: Lantus 10 units at HS   Note:  Moved to [facility] March 16, 2014.  Has cousins in [place], but sounds like she doesn't have much of a support system established here yet-- attributes this to multiple hospitalizations.  Considered her diabetes to be "borderline" until now.  States she took a pill for it, but a doctor (during a previous hospitalization) told her "it was too low", so she stopped taking her pill.  Last A1C was 8.8 in May 2016.  (Others have 8.6, 8.3, 8.3)  Current A1C pending.  Verbalized that she can no longer continue to come to the hospital so much and that she realizes she needs to establish care.  States she will have an appointment for the [facility]-- and they will help with transportation, medicaid application, meds, etc.  Knows little about diabetes.  Agreeable to receive an instructional booklet when I told her there was no charge for it.  Tried to show the patient how to watch the instructional videos about diabetes on the Patient Education Network-- but patient insisted she would not watch them because it would be too "much" for her.    Recommendations: Agree that insulin is probably not appropriate for her at present . At discharge, would probably be appropriate to prescribe Glucotrol 5 mg daily.  Will likely need Metformin added (unless contraindicated) either at discharge or when followed up at [facility].  Have [facility] make home visit. (Will alert [facility] secretary Nena Polio regarding this request.)     Once  Medicaid is established:   Needs a glucose meter   Consider Outpatient Mental Health Assessment Referral to assess coping to new living situation since death of  her mother and coping with chronic disease mgt.   Consider referral to [facility] if needs education beyond what [facility] can provide.   Thank you.  Gema Ringold S. Elsie Lincoln, RN, CNS, CDE Inpatient Diabetes Program, team pager [phone] (8am to 5pm)

## 2014-12-16 NOTE — Progress Notes (Signed)
Patient Name: Kaitlyn [name] Date of Encounter: 12/16/2014  Principal Problem:   Acute on chronic congestive heart failure Active Problems:   Diabetes mellitus type 2, uncontrolled   H/O noncompliance with medical treatment, presenting hazards to health   Atrial fibrillation with RVR   Essential hypertension   Acute on chronic systolic CHF (congestive heart failure)   Seizures   Shortness of breath   CHF (congestive heart failure), NYHA class IV     SUBJECTIVE  Feels better. Denies chest pain, sob or palpitation. Wants to go home.   CURRENT MEDS . aspirin  325 mg Oral Daily  . digoxin  0.125 mg Oral Daily  . enoxaparin (LOVENOX) injection  40 mg Subcutaneous Q24H  . furosemide  40 mg Intravenous BID  . insulin aspart  0-15 Units Subcutaneous TID WC  . insulin glargine  10 Units Subcutaneous QHS  . lisinopril  5 mg Oral Daily  . metoprolol succinate  100 mg Oral BID  . sodium chloride  3 mL Intravenous Q12H    OBJECTIVE  Filed Vitals:   12/15/14 1348 12/15/14 2209 12/16/14 0444 12/16/14 0857  BP: 131/74 132/85 134/88 113/73  Pulse: 96 93 102 101  Temp: 98 F (36.7 C) 98 F (36.7 C) 98.1 F (36.7 C) 98.2 F (36.8 C)  TempSrc: Oral Oral Oral Oral  Resp: 18 18 18 17   Height:      Weight:   273 lb 9.6 oz (124.104 kg)   SpO2: 92% 97% 96% 96%    Intake/Output Summary (Last 24 hours) at 12/16/14 0910 Last data filed at 12/16/14 0904  Gross per 24 hour  Intake   1080 ml  Output   7550 ml  Net  -6470 ml   Filed Weights   12/14/14 1606 12/15/14 0248 12/16/14 0444  Weight: 275 lb 12.7 oz (125.1 kg) 278 lb 7.1 oz (126.3 kg) 273 lb 9.6 oz (124.104 kg)    PHYSICAL EXAM  General: Pleasant, NAD. Neuro: Alert and oriented X 3. Moves all extremities spontaneously. Psych: Normal affect. HEENT:  Normal  Neck: Supple without bruits or JVD. Lungs:  Resp regular and unlabored, CTA. Heart: irregular no s3, s4, or murmurs. Abdomen: Soft, non-tender, non-distended, BS +  x 4.  Extremities: No clubbing, cyanosis . 1+ BL LE edema. DP/PT/Radials 2+ and equal bilaterally.  Accessory Clinical Findings  CBC  Recent Labs  12/14/14 0950 12/15/14 0253  WBC 10.9* 8.3  HGB 11.9* 11.3*  HCT 36.4 35.9*  MCV 102.8* 103.2*  PLT 208 191   Basic Metabolic Panel  Recent Labs  12/15/14 0253 12/16/14 0333  NA 139 137  K 4.0 3.8  CL 105 105  CO2 26 26  GLUCOSE 206* 185*  BUN 18 18  CREATININE 1.04* 1.01*  CALCIUM 8.8* 8.5*   D-Dimer  Recent Labs  12/14/14 0950  DDIMER 0.68*   TELE  afib at rate of 90s. Occasionally in 120s.   Radiology/Studies  Dg Chest 2 View  12/14/2014   CLINICAL DATA:  Shortness of breath and tachycardia for 2 days. History of atrial fibrillation, diabetes and hypertension.  EXAM: CHEST  2 VIEW  COMPARISON:  12/09/2014; 12/05/2014; 09/19/2014; chest CT - 09/24/2014  FINDINGS: Grossly unchanged enlarged cardiac silhouette and mediastinal contours. Overall improved aeration of lungs with persistent bibasilar heterogeneous opacities, likely atelectasis. No new focal airspace opacities. No pleural effusion or pneumothorax. There is persistent pleural parenchymal thickening about the bilateral major fissures. Mild pulmonary is congestion without frank evidence of  edema. No acute osseus abnormalities.  IMPRESSION: Similar findings of cardiomegaly and pulmonary venous congestion without evidence of edema.   Electronically Signed   By: Simonne Come M.D.   On: 12/14/2014 11:03   Dg Chest 2 View  12/09/2014   CLINICAL DATA:  Shortness of breath for 1 day  EXAM: CHEST  2 VIEW  COMPARISON:  12/05/2014  FINDINGS: Moderate enlargement of the cardiac silhouette is noted with central vascular congestion and possible early interstitial edema. No focal pulmonary opacity. Trace pleural fluid. No acute osseous abnormality. Stable prominence of the superior vascular pedicle.  IMPRESSION: Cardiomegaly with borderline interstitial edema and trace pleural  effusions.   Electronically Signed   By: Christiana Pellant M.D.   On: 12/09/2014 13:30   Dg Chest 2 View  12/05/2014   CLINICAL DATA:  64 year old female with shortness of breath  EXAM: CHEST  2 VIEW  COMPARISON:  Radiograph dated 11/27/2014  FINDINGS: Two views of the chest demonstrate clear lungs. There is mild blunting of the right costophrenic angle which may represent subsegmental atelectasis versus small pleural effusion. Stable cardiac silhouette. The osseous structures are grossly unremarkable.  IMPRESSION: No focal consolidation.   Electronically Signed   By: Elgie Collard M.D.   On: 12/05/2014 20:15   Dg Chest 2 View  11/27/2014   CLINICAL DATA:  64 year old female with severe shortness of breath for the past 2 days.  EXAM: CHEST  2 VIEW  COMPARISON:  Prior chest x-ray 11/22/2014  FINDINGS: Stable enlargement of the cardiopericardial silhouette. Interval increase in pulmonary vascular congestion now with mild interstitial edema. Kerley B-lines are noted in the periphery. Mild dependent bibasilar atelectasis. No pneumothorax. No acute osseous abnormality.  IMPRESSION: 1. Radiographic findings are most consistent with mild CHF. 2. Stable cardiomegaly with increased pulmonary vascular congestion and mild interstitial edema.   Electronically Signed   By: Malachy Moan M.D.   On: 11/27/2014 12:57   Dg Chest Port 1 View  11/22/2014   CLINICAL DATA:  Shortness of breath  EXAM: PORTABLE CHEST - 1 VIEW  COMPARISON:  11/05/2014  FINDINGS: Chronic cardiopericardial enlargement and vascular pedicle widening. The aorta is chronically tortuous.  Interstitial coarsening at the bases without overt pulmonary edema or pleural effusion. Cephalized blood flow. No indication of pneumonia. No air leak.  IMPRESSION: Cardiomegaly and pulmonary venous congestion.   Electronically Signed   By: Marnee Spring M.D.   On: 11/22/2014 06:19    ASSESSMENT AND PLAN  1. Acute on chronic systolic heart failure (EF 20-25%  on 10/10/14):  - Due to medication noncompliance. - 6L of diuresis and 5lb weight loss in last 24 hours. She only received IV lasix  in AM yesterday, refused PM dose. Now on IV lasix  BID. Breathing improved. She will be a good candidate of once daily dose of diuretics on discharge, given medical noncompliance.  - Continue digoxin, lisinopril, and Toprol XL  BID.  -  Emphasized importance medication compliance and outpatient follow up.   2. Atrial fibrillation: -  CHA2DS2VASC score of 4. She is a poor anticoagulation candidate due to noncompliance and inability to afford novel oral anticoagulants at this time (had been prescribed Eliquis in past).  - Continue ASA 325 mg daily for now but could reconsider in future once SW/Medicaid involved. Continue Toprol-XL and digoxin for rate control. - Currently rate in 90s. Occasional spike to 120s.   3. Mildly elevated d-dimer  - Can be seen in CHF as well as poorly  controlled diabetes.  3. Poorly controlled diabetes:  - IM to manage medications.  4. Social: Patient well known to SW service. Has been noncompliant with applying for Medicaid. Does not have insurance for ALF coverage.  Signed, Manson Passey PA-C Pager [phone]  Personally seen and examined. Agree with above. She feels better she states and would like to go home.  I recommend one more day of IV diuresis but she is fairly insistent about going home.  Has been a patient of Dr. Shirlee Latch but has had multiple no shows.  Understands importance of lasix. Says she will take at home.  Spoke with Dr. Gwenlyn Perking. Has extensive history of non-compliance, ER visits, [facility] involement.  Donato Schultz, MD

## 2014-12-16 NOTE — Progress Notes (Signed)
[facility] taking patient home. Patient taken out of hospital via stretcher for discharge.

## 2014-12-17 ENCOUNTER — Inpatient Hospital Stay: Admission: RE | Admit: 2014-12-17 | Payer: Self-pay | Source: Ambulatory Visit

## 2014-12-17 LAB — HEMOGLOBIN A1C
Hgb A1c MFr Bld: 9.7 % — ABNORMAL HIGH (ref 4.8–5.6)
Mean Plasma Glucose: 232 mg/dL

## 2014-12-20 ENCOUNTER — Inpatient Hospital Stay: Payer: Self-pay

## 2014-12-22 ENCOUNTER — Inpatient Hospital Stay (HOSPITAL_COMMUNITY)
Admission: EM | Admit: 2014-12-22 | Discharge: 2014-12-25 | DRG: 293 | Disposition: A | Discharge status: Home / Self Care | Payer: Self-pay | Attending: Internal Medicine | Admitting: Internal Medicine

## 2014-12-22 ENCOUNTER — Emergency Department (HOSPITAL_COMMUNITY): Payer: Self-pay

## 2014-12-22 ENCOUNTER — Encounter (HOSPITAL_COMMUNITY): Payer: Self-pay | Admitting: Neurology

## 2014-12-22 ENCOUNTER — Observation Stay (HOSPITAL_COMMUNITY): Payer: Self-pay

## 2014-12-22 DIAGNOSIS — I482 Chronic atrial fibrillation, unspecified: Secondary | ICD-10-CM | POA: Diagnosis present

## 2014-12-22 DIAGNOSIS — Z7982 Long term (current) use of aspirin: Secondary | ICD-10-CM

## 2014-12-22 DIAGNOSIS — E876 Hypokalemia: Secondary | ICD-10-CM | POA: Diagnosis present

## 2014-12-22 DIAGNOSIS — L719 Rosacea, unspecified: Secondary | ICD-10-CM | POA: Diagnosis present

## 2014-12-22 DIAGNOSIS — Z91199 Patient's noncompliance with other medical treatment and regimen due to unspecified reason: Secondary | ICD-10-CM

## 2014-12-22 DIAGNOSIS — I351 Nonrheumatic aortic (valve) insufficiency: Secondary | ICD-10-CM | POA: Diagnosis present

## 2014-12-22 DIAGNOSIS — R0602 Shortness of breath: Secondary | ICD-10-CM | POA: Diagnosis present

## 2014-12-22 DIAGNOSIS — Z79899 Other long term (current) drug therapy: Secondary | ICD-10-CM

## 2014-12-22 DIAGNOSIS — I509 Heart failure, unspecified: Secondary | ICD-10-CM

## 2014-12-22 DIAGNOSIS — I5023 Acute on chronic systolic (congestive) heart failure: Secondary | ICD-10-CM | POA: Diagnosis present

## 2014-12-22 DIAGNOSIS — I4819 Other persistent atrial fibrillation: Secondary | ICD-10-CM

## 2014-12-22 DIAGNOSIS — Z9119 Patient's noncompliance with other medical treatment and regimen: Secondary | ICD-10-CM | POA: Diagnosis present

## 2014-12-22 DIAGNOSIS — M1990 Unspecified osteoarthritis, unspecified site: Secondary | ICD-10-CM | POA: Diagnosis present

## 2014-12-22 DIAGNOSIS — I10 Essential (primary) hypertension: Secondary | ICD-10-CM | POA: Diagnosis present

## 2014-12-22 DIAGNOSIS — D638 Anemia in other chronic diseases classified elsewhere: Secondary | ICD-10-CM | POA: Diagnosis present

## 2014-12-22 DIAGNOSIS — E119 Type 2 diabetes mellitus without complications: Secondary | ICD-10-CM | POA: Diagnosis present

## 2014-12-22 DIAGNOSIS — E1165 Type 2 diabetes mellitus with hyperglycemia: Secondary | ICD-10-CM | POA: Diagnosis present

## 2014-12-22 DIAGNOSIS — R591 Generalized enlarged lymph nodes: Secondary | ICD-10-CM | POA: Diagnosis present

## 2014-12-22 DIAGNOSIS — I5043 Acute on chronic combined systolic (congestive) and diastolic (congestive) heart failure: Principal | ICD-10-CM | POA: Diagnosis present

## 2014-12-22 DIAGNOSIS — Z880 Allergy status to penicillin: Secondary | ICD-10-CM

## 2014-12-22 LAB — CBC WITH DIFFERENTIAL/PLATELET
Basophils Absolute: 0.1 10*3/uL (ref 0.0–0.1)
Basophils Relative: 1 % (ref 0–1)
Eosinophils Absolute: 0.3 10*3/uL (ref 0.0–0.7)
Eosinophils Relative: 3 % (ref 0–5)
HCT: 37.6 % (ref 36.0–46.0)
Hemoglobin: 11.8 g/dL — ABNORMAL LOW (ref 12.0–15.0)
Lymphocytes Relative: 13 % (ref 12–46)
Lymphs Abs: 1.4 10*3/uL (ref 0.7–4.0)
MCH: 32.5 pg (ref 26.0–34.0)
MCHC: 31.4 g/dL (ref 30.0–36.0)
MCV: 103.6 fL — ABNORMAL HIGH (ref 78.0–100.0)
Monocytes Absolute: 0.6 10*3/uL (ref 0.1–1.0)
Monocytes Relative: 6 % (ref 3–12)
Neutro Abs: 8.5 10*3/uL — ABNORMAL HIGH (ref 1.7–7.7)
Neutrophils Relative %: 77 % (ref 43–77)
Platelets: 215 10*3/uL (ref 150–400)
RBC: 3.63 MIL/uL — ABNORMAL LOW (ref 3.87–5.11)
RDW: 14.9 % (ref 11.5–15.5)
WBC: 10.9 10*3/uL — ABNORMAL HIGH (ref 4.0–10.5)

## 2014-12-22 LAB — BASIC METABOLIC PANEL
Anion gap: 10 (ref 5–15)
BUN: 15 mg/dL (ref 6–20)
CO2: 22 mmol/L (ref 22–32)
Calcium: 8.7 mg/dL — ABNORMAL LOW (ref 8.9–10.3)
Chloride: 105 mmol/L (ref 101–111)
Creatinine, Ser: 0.95 mg/dL (ref 0.44–1.00)
GFR calc Af Amer: >60 mL/min (ref >60)
GFR calc non Af Amer: >60 mL/min (ref >60)
Glucose, Bld: 275 mg/dL — ABNORMAL HIGH (ref 65–99)
Potassium: 3.9 mmol/L (ref 3.5–5.1)
Sodium: 137 mmol/L (ref 135–145)

## 2014-12-22 LAB — I-STAT TROPONIN, ED: Troponin i, poc: 0.01 ng/mL (ref 0.00–0.08)

## 2014-12-22 LAB — GLUCOSE, CAPILLARY
Glucose-Capillary: 280 mg/dL — ABNORMAL HIGH (ref 65–99)
Glucose-Capillary: 204 mg/dL — ABNORMAL HIGH (ref 65–99)
Glucose-Capillary: 187 mg/dL — ABNORMAL HIGH (ref 65–99)

## 2014-12-22 LAB — DIGOXIN LEVEL: Digoxin Level: 0.6 ng/mL — ABNORMAL LOW (ref 0.8–2.0)

## 2014-12-22 LAB — BRAIN NATRIURETIC PEPTIDE: B Natriuretic Peptide: 1240.2 pg/mL — ABNORMAL HIGH (ref 0.0–100.0)

## 2014-12-22 LAB — LACTATE DEHYDROGENASE: LDH: 242 U/L — ABNORMAL HIGH (ref 98–192)

## 2014-12-22 MED ORDER — SODIUM CHLORIDE 0.9 % IJ SOLN
3.0000 mL | Freq: Two times a day (BID) | INTRAMUSCULAR | Status: DC
  Administered 2014-12-22 – 2014-12-25 (×7): 3 mL via INTRAVENOUS

## 2014-12-22 MED ORDER — DIGOXIN 125 MCG PO TABS: 0.1250 mg | ORAL_TABLET | Freq: Every day | ORAL | Status: DC

## 2014-12-22 MED ORDER — METOPROLOL SUCCINATE ER 100 MG PO TB24
100.0000 mg | ORAL_TABLET | Freq: Two times a day (BID) | ORAL | Status: DC
  Administered 2014-12-22 – 2014-12-25 (×6): 100 mg via ORAL
  Filled 2014-12-22 (×7): qty 1

## 2014-12-22 MED ORDER — FUROSEMIDE 10 MG/ML IJ SOLN
40.0000 mg | Freq: Three times a day (TID) | INTRAMUSCULAR | Status: DC
  Administered 2014-12-22 – 2014-12-23 (×2): 40 mg via INTRAVENOUS
  Filled 2014-12-22 (×4): qty 4

## 2014-12-22 MED ORDER — ASPIRIN 325 MG PO TABS
325.0000 mg | ORAL_TABLET | Freq: Every day | ORAL | Status: DC
  Administered 2014-12-22 – 2014-12-25 (×4): 325 mg via ORAL
  Filled 2014-12-22 (×4): qty 1

## 2014-12-22 MED ORDER — FUROSEMIDE 10 MG/ML IJ SOLN
60.0000 mg | Freq: Once | INTRAMUSCULAR | Status: AC
  Administered 2014-12-22: 60 mg via INTRAVENOUS
  Filled 2014-12-22: qty 6

## 2014-12-22 MED ORDER — POTASSIUM CHLORIDE CRYS ER 20 MEQ PO TBCR
40.0000 meq | EXTENDED_RELEASE_TABLET | Freq: Every day | ORAL | Status: DC
  Administered 2014-12-22 – 2014-12-25 (×4): 40 meq via ORAL
  Filled 2014-12-22 (×4): qty 2

## 2014-12-22 MED ORDER — ACETAMINOPHEN 325 MG PO TABS: 650.0000 mg | ORAL_TABLET | ORAL | Status: DC | PRN

## 2014-12-22 MED ORDER — DIGOXIN 125 MCG PO TABS
0.1250 mg | ORAL_TABLET | Freq: Every day | ORAL | Status: DC
  Administered 2014-12-23 – 2014-12-25 (×3): 0.125 mg via ORAL
  Filled 2014-12-22 (×3): qty 1

## 2014-12-22 MED ORDER — ENOXAPARIN SODIUM 40 MG/0.4ML ~~LOC~~ SOLN
40.0000 mg | SUBCUTANEOUS | Status: DC
  Administered 2014-12-22 – 2014-12-24 (×3): 40 mg via SUBCUTANEOUS
  Filled 2014-12-22 (×4): qty 0.4

## 2014-12-22 MED ORDER — INSULIN DETEMIR 100 UNIT/ML ~~LOC~~ SOLN
10.0000 [IU] | Freq: Every day | SUBCUTANEOUS | Status: DC
  Administered 2014-12-22 – 2014-12-24 (×3): 10 [IU] via SUBCUTANEOUS
  Filled 2014-12-22 (×4): qty 0.1

## 2014-12-22 MED ORDER — SODIUM CHLORIDE 0.9 % IJ SOLN: 3.0000 mL | INTRAMUSCULAR | Status: DC | PRN

## 2014-12-22 MED ORDER — INSULIN ASPART 100 UNIT/ML ~~LOC~~ SOLN
0.0000 [IU] | Freq: Every day | SUBCUTANEOUS | Status: DC
Start: 2014-12-22 — End: 2014-12-25

## 2014-12-22 MED ORDER — LISINOPRIL 5 MG PO TABS
5.0000 mg | ORAL_TABLET | Freq: Every day | ORAL | Status: DC
  Administered 2014-12-22 – 2014-12-25 (×4): 5 mg via ORAL
  Filled 2014-12-22 (×4): qty 1

## 2014-12-22 MED ORDER — SODIUM CHLORIDE 0.9 % IV SOLN: 250.0000 mL | INTRAVENOUS | Status: DC | PRN

## 2014-12-22 MED ORDER — INSULIN ASPART 100 UNIT/ML ~~LOC~~ SOLN
0.0000 [IU] | Freq: Three times a day (TID) | SUBCUTANEOUS | Status: DC
  Administered 2014-12-22: 3 [IU] via SUBCUTANEOUS
  Administered 2014-12-23 (×3): 2 [IU] via SUBCUTANEOUS
  Administered 2014-12-24 (×2): 3 [IU] via SUBCUTANEOUS
  Administered 2014-12-24 – 2014-12-25 (×2): 2 [IU] via SUBCUTANEOUS

## 2014-12-22 MED ORDER — ONDANSETRON HCL 4 MG/2ML IJ SOLN: 4.0000 mg | Freq: Four times a day (QID) | INTRAMUSCULAR | Status: DC | PRN

## 2014-12-22 MED ORDER — METOPROLOL SUCCINATE ER 100 MG PO TB24: 100.0000 mg | ORAL_TABLET | Freq: Two times a day (BID) | ORAL | Status: DC

## 2014-12-22 NOTE — Progress Notes (Signed)
Pt taken down to CT and refused to had test done.

## 2014-12-22 NOTE — ED Notes (Signed)
Pt refused to ambulate due to not feeling well and having 2 hurt knees. Pt provided walker and 2 RN stand by assist. Pt still refuses, MD made aware.
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
## 2014-12-22 NOTE — Progress Notes (Signed)
Pt agreeable to pm lasix

## 2014-12-22 NOTE — ED Notes (Signed)
Per ems-Pt reports sob since yesterday with hx of chf and afib. 93-95% RA. BP 122/94, CBG 298. Denies pain. Lung sounds clear/
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
## 2014-12-22 NOTE — Progress Notes (Signed)
Pt informed me that she will not take any more iv lasix today.

## 2014-12-23 DIAGNOSIS — I509 Heart failure, unspecified: Secondary | ICD-10-CM

## 2014-12-23 LAB — GLUCOSE, CAPILLARY
Glucose-Capillary: 162 mg/dL — ABNORMAL HIGH (ref 65–99)
Glucose-Capillary: 169 mg/dL — ABNORMAL HIGH (ref 65–99)
Glucose-Capillary: 195 mg/dL — ABNORMAL HIGH (ref 65–99)
Glucose-Capillary: 193 mg/dL — ABNORMAL HIGH (ref 65–99)

## 2014-12-23 LAB — BASIC METABOLIC PANEL
Anion gap: 10 (ref 5–15)
BUN: 16 mg/dL (ref 6–20)
CO2: 27 mmol/L (ref 22–32)
Calcium: 8.8 mg/dL — ABNORMAL LOW (ref 8.9–10.3)
Chloride: 100 mmol/L — ABNORMAL LOW (ref 101–111)
Creatinine, Ser: 1.02 mg/dL — ABNORMAL HIGH (ref 0.44–1.00)
GFR calc Af Amer: >60 mL/min (ref >60)
GFR calc non Af Amer: 57 mL/min — ABNORMAL LOW (ref >60)
Glucose, Bld: 195 mg/dL — ABNORMAL HIGH (ref 65–99)
Potassium: 3.6 mmol/L (ref 3.5–5.1)
Sodium: 137 mmol/L (ref 135–145)

## 2014-12-23 MED ORDER — FUROSEMIDE 10 MG/ML IJ SOLN
40.0000 mg | Freq: Two times a day (BID) | INTRAMUSCULAR | Status: DC
Start: 2014-12-23 — End: 2014-12-25
  Administered 2014-12-23 – 2014-12-24 (×3): 40 mg via INTRAVENOUS
  Filled 2014-12-23 (×5): qty 4
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
## 2014-12-24 LAB — GLUCOSE, CAPILLARY
Glucose-Capillary: 224 mg/dL — ABNORMAL HIGH (ref 65–99)
Glucose-Capillary: 194 mg/dL — ABNORMAL HIGH (ref 65–99)
Glucose-Capillary: 248 mg/dL — ABNORMAL HIGH (ref 65–99)
Glucose-Capillary: 187 mg/dL — ABNORMAL HIGH (ref 65–99)

## 2014-12-24 LAB — BASIC METABOLIC PANEL
Anion gap: 10 (ref 5–15)
BUN: 20 mg/dL (ref 6–20)
CO2: 26 mmol/L (ref 22–32)
Calcium: 8.9 mg/dL (ref 8.9–10.3)
Chloride: 99 mmol/L — ABNORMAL LOW (ref 101–111)
Creatinine, Ser: 1.1 mg/dL — ABNORMAL HIGH (ref 0.44–1.00)
GFR calc Af Amer: >60 mL/min (ref >60)
GFR calc non Af Amer: 52 mL/min — ABNORMAL LOW (ref >60)
Glucose, Bld: 248 mg/dL — ABNORMAL HIGH (ref 65–99)
Potassium: 3.7 mmol/L (ref 3.5–5.1)
Sodium: 135 mmol/L (ref 135–145)
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
## 2014-12-24 NOTE — Progress Notes (Signed)
CM CONSULT Patient known from previous admissions Talked to patient about DCP; patient refused to have apt arranged for her to have a PCP, CM informed her of the importance of having a PCP for Medicaid/ Disability which she states that she is trying to get; patient stated that [facility]. Patient refused any HHC or DME at this time. Patient does not qualify for the Medication Assistance Fund because she has recently used it. B Yaris Ferrell RN,MHA,BSN  CM note from 11/05/2014 Talked to pt about follow up care. Patient lives alone in apt, use a walker and a cab at times for transportation. Otherwise she does not come out of her home. Grocery is delivered to her home from [employer], she calls someone from her apt to take her trash out. Patient does not like follow up care and does not think that it is necessary for her health. CM informed the patient of the importance of follow up care and because of that there is a lapse in her care which is causing frequent hospitalizations. Patient finally agreed to go to the [facility]ness program. Shanda Bumps with the [facility] at the [facility] contacted for an apt. CM encouraged patient to apply for Medicaid of [place] for medical insurance/ prescription drug coverage. Long discussion with patient about taking care of herself. CM again talked to the patient of the importance of having a PCP and that the hospital will take care of emergency issues and her PCP will manage her care outside of the hospital. Patient stated several times, " I took myself off of certain medication,". CM informed her that the problem is she is not to take herself off of her medication without a physicians order. Patient recently was discharged from [facility] and received the [facility] fund ( Medication Assistance Through [facility]) and she does not qualify for assistance again. She can only use the MATCH fund once a year. Patient stated that she has  all of her medication at home except Eliquis. Patient did not follow up with Eliquis medication assistance program after the 30 day free trail of medication.  CM note from 11/05/2014 Valentina Lucks RN - NCM aware of pt arrival. Pt has refused [facility] assistance. She has been MATCHed within last year and has not changed Medicaid from [place] to [place] as of this time. NCM will continue to follow until disposition determined. Camellia J. Lucretia Roers, RN, BSN, [employer] [phone]  CM note from 10/11/2014 - H Mayo RN - Appt was arranged by ED CM with [facility] - pt did not keep appt. Medications were also provided. Pt has not gone to DSS to change [place] Medicaid to [facility] - states she is sure she will be able to use [place] Medicaid to fill scripts. Per pt, she lived with her mother who had a reverse mortgage on her house and had to move out when her mother died - chose [facility] because she has cousins in [place]. States she has a problem with transportation - explained that [place] Medicaid includes a transportation benefit and that most medication copays will be $3.00 or less.
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
## 2014-12-24 NOTE — Progress Notes (Signed)
Inpatient Diabetes Program Recommendations  AACE/ADA: New Consensus Statement on Inpatient Glycemic Control (2013)  Target Ranges:  Prepandial:   less than 140 mg/dL      Peak postprandial:   less than 180 mg/dL (1-2 hours)      Critically ill patients:  140 - 180 mg/dL   Results for JADIE, FIRESTONE (MRN [id number]) as of 12/24/2014 07:31  Ref. Range 12/23/2014 05:28 12/23/2014 11:12 12/23/2014 16:42 12/23/2014 20:58 12/24/2014 05:51  Glucose-Capillary Latest Ref Range: 65-99 mg/dL 638 (H) 937 (H) 342 (H) 193 (H) 224 (H)   Results for LUSINE, GARI (MRN [id number]) as of 12/24/2014 07:31  Ref. Range 12/16/2014 03:33  Hemoglobin A1C Latest Ref Range: 4.8-5.6 % 9.7 (H)   Diabetes history: DM2 Outpatient Diabetes medications: Glipizide 5mg  QAM (home med list states patient is not taking) Current orders for Inpatient glycemic control: Levemir 10 units QHS, Novolog 0-9 units TID with meals, Novolog 0-5 units HS  Inpatient Diabetes Program Recommendations Insulin - Basal: Please consider increasing Levemir to 12 units QHS. Correction (SSI): Please consider increasing Novolog correction to Moderate scale.  Thanks, Orlando Penner, RN, MSN, CCRN, CDE Diabetes Coordinator Inpatient Diabetes Program [phone] (Team Pager from 8am to 5pm) [phone] ([facility] office) [phone] [facility] office) [phone] [facility] office)

## 2014-12-25 DIAGNOSIS — I5023 Acute on chronic systolic (congestive) heart failure: Secondary | ICD-10-CM

## 2014-12-25 DIAGNOSIS — Z9119 Patient's noncompliance with other medical treatment and regimen: Secondary | ICD-10-CM

## 2014-12-25 DIAGNOSIS — I482 Chronic atrial fibrillation: Secondary | ICD-10-CM

## 2014-12-25 DIAGNOSIS — E1165 Type 2 diabetes mellitus with hyperglycemia: Secondary | ICD-10-CM

## 2014-12-25 LAB — BASIC METABOLIC PANEL
Anion gap: 9 (ref 5–15)
BUN: 22 mg/dL — ABNORMAL HIGH (ref 6–20)
CO2: 29 mmol/L (ref 22–32)
Calcium: 9.1 mg/dL (ref 8.9–10.3)
Chloride: 98 mmol/L — ABNORMAL LOW (ref 101–111)
Creatinine, Ser: 1.08 mg/dL — ABNORMAL HIGH (ref 0.44–1.00)
GFR calc Af Amer: >60 mL/min (ref >60)
GFR calc non Af Amer: 53 mL/min — ABNORMAL LOW (ref >60)
Glucose, Bld: 257 mg/dL — ABNORMAL HIGH (ref 65–99)
Potassium: 4.2 mmol/L (ref 3.5–5.1)
Sodium: 136 mmol/L (ref 135–145)

## 2014-12-25 LAB — GLUCOSE, CAPILLARY: Glucose-Capillary: 168 mg/dL — ABNORMAL HIGH (ref 65–99)

## 2014-12-25 MED ORDER — POTASSIUM CHLORIDE CRYS ER 20 MEQ PO TBCR: 40.0000 meq | EXTENDED_RELEASE_TABLET | Freq: Every day | ORAL | Status: DC

## 2014-12-25 MED ORDER — METOPROLOL SUCCINATE ER 100 MG PO TB24: 100.0000 mg | ORAL_TABLET | Freq: Two times a day (BID) | ORAL | Status: DC

## 2014-12-25 MED ORDER — DIGOXIN 125 MCG PO TABS: 0.1250 mg | ORAL_TABLET | Freq: Every day | ORAL | Status: DC

## 2014-12-25 MED ORDER — ASPIRIN 325 MG PO TABS: 325.0000 mg | ORAL_TABLET | Freq: Every day | ORAL | Status: DC

## 2014-12-25 MED ORDER — GLIPIZIDE 5 MG PO TABS: 5.0000 mg | ORAL_TABLET | Freq: Every day | ORAL | Status: DC

## 2014-12-25 MED ORDER — FUROSEMIDE 40 MG PO TABS: 40.0000 mg | ORAL_TABLET | Freq: Two times a day (BID) | ORAL | Status: DC

## 2014-12-25 MED ORDER — LISINOPRIL 5 MG PO TABS: 5.0000 mg | ORAL_TABLET | Freq: Every day | ORAL | Status: DC

## 2014-12-25 NOTE — Progress Notes (Signed)
Pt discharge education and instructions completed with pt and pt voices understanding. Pt discharge home with [facility] to transport her to disposition. Pt IV and telemetry removed; pt handed her prescriptions for aspirin, digoxin, lasix, glipizide, lisinopril and lopressor. Pt transported off unit via stretcher with belongings to the side. Arabella Merles Kenslee Achorn RN.

## 2014-12-25 NOTE — Progress Notes (Signed)
CSW (Clinical Child psychotherapist) spoke with pt and confirmed she would like non-emergent ambulance transport home. Pt confirmed her address and that she would have access to her apartment. Pt aware of potential to be billed for transport. CSW arranged transport and notified nursing. CSW signing off.  Adelaido Nicklaus, LCSWA [phone]
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
## 2014-12-27 ENCOUNTER — Inpatient Hospital Stay: Payer: Self-pay

## 2014-12-31 ENCOUNTER — Inpatient Hospital Stay (HOSPITAL_COMMUNITY): Payer: Self-pay

## 2015-01-04 ENCOUNTER — Encounter (HOSPITAL_COMMUNITY): Payer: Self-pay | Admitting: Emergency Medicine

## 2015-01-04 ENCOUNTER — Emergency Department (HOSPITAL_COMMUNITY)
Admission: EM | Admit: 2015-01-04 | Discharge: 2015-01-04 | Disposition: A | Discharge status: Home / Self Care | Payer: Medicaid - Out of State | Attending: Emergency Medicine | Admitting: Emergency Medicine

## 2015-01-04 DIAGNOSIS — Z880 Allergy status to penicillin: Secondary | ICD-10-CM | POA: Insufficient documentation

## 2015-01-04 DIAGNOSIS — Z872 Personal history of diseases of the skin and subcutaneous tissue: Secondary | ICD-10-CM | POA: Insufficient documentation

## 2015-01-04 DIAGNOSIS — Y9289 Other specified places as the place of occurrence of the external cause: Secondary | ICD-10-CM | POA: Insufficient documentation

## 2015-01-04 DIAGNOSIS — E119 Type 2 diabetes mellitus without complications: Secondary | ICD-10-CM | POA: Insufficient documentation

## 2015-01-04 DIAGNOSIS — I4892 Unspecified atrial flutter: Secondary | ICD-10-CM | POA: Insufficient documentation

## 2015-01-04 DIAGNOSIS — Z9119 Patient's noncompliance with other medical treatment and regimen: Secondary | ICD-10-CM | POA: Insufficient documentation

## 2015-01-04 DIAGNOSIS — Y998 Other external cause status: Secondary | ICD-10-CM | POA: Insufficient documentation

## 2015-01-04 DIAGNOSIS — Z79899 Other long term (current) drug therapy: Secondary | ICD-10-CM | POA: Insufficient documentation

## 2015-01-04 DIAGNOSIS — T781XXA Other adverse food reactions, not elsewhere classified, initial encounter: Secondary | ICD-10-CM | POA: Insufficient documentation

## 2015-01-04 DIAGNOSIS — Y9389 Activity, other specified: Secondary | ICD-10-CM | POA: Insufficient documentation

## 2015-01-04 DIAGNOSIS — R197 Diarrhea, unspecified: Secondary | ICD-10-CM | POA: Insufficient documentation

## 2015-01-04 DIAGNOSIS — I10 Essential (primary) hypertension: Secondary | ICD-10-CM | POA: Insufficient documentation

## 2015-01-04 DIAGNOSIS — T7840XA Allergy, unspecified, initial encounter: Secondary | ICD-10-CM

## 2015-01-04 DIAGNOSIS — I481 Persistent atrial fibrillation: Secondary | ICD-10-CM | POA: Insufficient documentation

## 2015-01-04 DIAGNOSIS — X58XXXA Exposure to other specified factors, initial encounter: Secondary | ICD-10-CM | POA: Insufficient documentation

## 2015-01-04 DIAGNOSIS — Z7982 Long term (current) use of aspirin: Secondary | ICD-10-CM | POA: Insufficient documentation

## 2015-01-04 DIAGNOSIS — M1990 Unspecified osteoarthritis, unspecified site: Secondary | ICD-10-CM | POA: Insufficient documentation

## 2015-01-04 DIAGNOSIS — I5022 Chronic systolic (congestive) heart failure: Secondary | ICD-10-CM | POA: Insufficient documentation

## 2015-01-04 DIAGNOSIS — R220 Localized swelling, mass and lump, head: Secondary | ICD-10-CM | POA: Insufficient documentation

## 2015-01-04 MED ORDER — FAMOTIDINE IN NACL 20-0.9 MG/50ML-% IV SOLN
20.0000 mg | Freq: Once | INTRAVENOUS | Status: AC
  Administered 2015-01-04: 20 mg via INTRAVENOUS
  Filled 2015-01-04: qty 50

## 2015-01-04 MED ORDER — DIPHENHYDRAMINE HCL 25 MG PO TABS: 25.0000 mg | ORAL_TABLET | Freq: Three times a day (TID) | ORAL | Status: DC | PRN

## 2015-01-04 MED ORDER — METHYLPREDNISOLONE SODIUM SUCC 125 MG IJ SOLR
125.0000 mg | Freq: Once | INTRAMUSCULAR | Status: DC
  Filled 2015-01-04: qty 2

## 2015-01-04 MED ORDER — DEXAMETHASONE SODIUM PHOSPHATE 10 MG/ML IJ SOLN
10.0000 mg | Freq: Once | INTRAMUSCULAR | Status: AC
  Administered 2015-01-04: 10 mg via INTRAVENOUS
  Filled 2015-01-04: qty 1

## 2015-01-04 MED ORDER — FAMOTIDINE 20 MG PO TABS: 20.0000 mg | ORAL_TABLET | Freq: Two times a day (BID) | ORAL | Status: DC
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
## 2015-01-04 NOTE — ED Notes (Signed)
Pt brought to ED by [facility] from home for Allergy reaction, pt states that she only had honey maid crackers today secondary to having loose stool episode, she took an Imodium for the loose stool and one hour later she started having itchiness on her bilateral hands and her though  was swollen that she can hardly talk, no respiratory distress noticed, pt got a 50 mg Benadryl from [facility].

## 2015-01-04 NOTE — ED Notes (Signed)
Pt placed in a gown and hooked to the monitor with the BP cuff and pulse ox

## 2015-01-04 NOTE — ED Notes (Signed)
Dc instructions given, medication package given as ordered by Dr. Norlene Campbell, some food given to pt since she state no having any food to eat at home until next Wed when she gets pay again, Voice message leaved to SW for some food arrangement.

## 2015-01-04 NOTE — Discharge Instructions (Signed)

## 2015-01-04 NOTE — ED Notes (Signed)
Pt oriented to call for assistance to get OOB to prevent any fall or injury while in the hospital, [facility] at the bedside. Pt refuses to call for help states she is able to get out bed by her self, call bell at given to the pt and frequently rounding given.

## 2015-01-07 ENCOUNTER — Inpatient Hospital Stay (HOSPITAL_COMMUNITY): Payer: Self-pay

## 2015-01-18 ENCOUNTER — Emergency Department (HOSPITAL_COMMUNITY)
Admission: EM | Admit: 2015-01-18 | Discharge: 2015-01-18 | Disposition: A | Discharge status: Home / Self Care | Payer: Medicaid - Out of State | Attending: Emergency Medicine | Admitting: Emergency Medicine

## 2015-01-18 ENCOUNTER — Encounter (HOSPITAL_COMMUNITY): Payer: Self-pay

## 2015-01-18 ENCOUNTER — Emergency Department (HOSPITAL_COMMUNITY): Payer: Medicaid - Out of State

## 2015-01-18 DIAGNOSIS — I481 Persistent atrial fibrillation: Secondary | ICD-10-CM | POA: Insufficient documentation

## 2015-01-18 DIAGNOSIS — Z7982 Long term (current) use of aspirin: Secondary | ICD-10-CM | POA: Insufficient documentation

## 2015-01-18 DIAGNOSIS — E119 Type 2 diabetes mellitus without complications: Secondary | ICD-10-CM | POA: Insufficient documentation

## 2015-01-18 DIAGNOSIS — I509 Heart failure, unspecified: Secondary | ICD-10-CM

## 2015-01-18 DIAGNOSIS — I4819 Other persistent atrial fibrillation: Secondary | ICD-10-CM

## 2015-01-18 DIAGNOSIS — Z880 Allergy status to penicillin: Secondary | ICD-10-CM | POA: Insufficient documentation

## 2015-01-18 DIAGNOSIS — Z9119 Patient's noncompliance with other medical treatment and regimen: Secondary | ICD-10-CM | POA: Insufficient documentation

## 2015-01-18 DIAGNOSIS — M1990 Unspecified osteoarthritis, unspecified site: Secondary | ICD-10-CM | POA: Insufficient documentation

## 2015-01-18 DIAGNOSIS — I5022 Chronic systolic (congestive) heart failure: Secondary | ICD-10-CM | POA: Insufficient documentation

## 2015-01-18 DIAGNOSIS — Z79899 Other long term (current) drug therapy: Secondary | ICD-10-CM | POA: Insufficient documentation

## 2015-01-18 LAB — CBC
HCT: 35.8 % — ABNORMAL LOW (ref 36.0–46.0)
Hemoglobin: 11.5 g/dL — ABNORMAL LOW (ref 12.0–15.0)
MCH: 32.5 pg (ref 26.0–34.0)
MCHC: 32.1 g/dL (ref 30.0–36.0)
MCV: 101.1 fL — ABNORMAL HIGH (ref 78.0–100.0)
Platelets: 193 10*3/uL (ref 150–400)
RBC: 3.54 MIL/uL — ABNORMAL LOW (ref 3.87–5.11)
RDW: 14.9 % (ref 11.5–15.5)
WBC: 13.5 10*3/uL — ABNORMAL HIGH (ref 4.0–10.5)

## 2015-01-18 LAB — BASIC METABOLIC PANEL
Anion gap: 7 (ref 5–15)
BUN: 14 mg/dL (ref 6–20)
CO2: 22 mmol/L (ref 22–32)
Calcium: 8.4 mg/dL — ABNORMAL LOW (ref 8.9–10.3)
Chloride: 104 mmol/L (ref 101–111)
Creatinine, Ser: 0.91 mg/dL (ref 0.44–1.00)
GFR calc Af Amer: >60 mL/min (ref >60)
GFR calc non Af Amer: >60 mL/min (ref >60)
Glucose, Bld: 259 mg/dL — ABNORMAL HIGH (ref 65–99)
Potassium: 4 mmol/L (ref 3.5–5.1)
Sodium: 133 mmol/L — ABNORMAL LOW (ref 135–145)

## 2015-01-18 LAB — I-STAT TROPONIN, ED: Troponin i, poc: 0 ng/mL (ref 0.00–0.08)

## 2015-01-18 LAB — DIGOXIN LEVEL: Digoxin Level: <0.2 ng/mL — ABNORMAL LOW (ref 0.8–2.0)

## 2015-01-18 LAB — BRAIN NATRIURETIC PEPTIDE: B Natriuretic Peptide: 943.2 pg/mL — ABNORMAL HIGH (ref 0.0–100.0)

## 2015-01-18 MED ORDER — LISINOPRIL 10 MG PO TABS: 10.0000 mg | ORAL_TABLET | Freq: Every day | ORAL | Status: DC

## 2015-01-18 MED ORDER — FUROSEMIDE 10 MG/ML IJ SOLN
40.0000 mg | Freq: Once | INTRAMUSCULAR | Status: DC
  Filled 2015-01-18: qty 4

## 2015-01-18 MED ORDER — ALBUTEROL SULFATE (2.5 MG/3ML) 0.083% IN NEBU: 5.0000 mg | INHALATION_SOLUTION | Freq: Once | RESPIRATORY_TRACT | Status: DC

## 2015-01-18 NOTE — ED Notes (Signed)
Per Dr. Bebe Shaggy pt may have po intake per heart healthy diet.

## 2015-01-18 NOTE — Discharge Instructions (Signed)
Atrial Fibrillation  Atrial fibrillation is a condition that causes your heart to beat irregularly. It may also cause your heart to beat faster than normal. Atrial fibrillation can prevent your heart from pumping blood normally. It increases your risk of stroke and heart problems.  HOME CARE  · Take medications as told by your doctor.  · Only take medications that your doctor says are safe. Some medications can make the condition worse or happen again.  · If blood thinners were prescribed by your doctor, take them exactly as told. Too much can cause bleeding. Too little and you will not have the needed protection against stroke and other problems.  · Perform blood tests at home if told by your doctor.  · Perform blood tests exactly as told by your doctor.  · Do not drink alcohol.  · Do not drink beverages with caffeine such as coffee, soda, and some teas.  · Maintain a healthy weight.  · Do not use diet pills unless your doctor says they are safe. They may make heart problems worse.  · Follow diet instructions as told by your doctor.  · Exercise regularly as told by your doctor.  · Keep all follow-up appointments.  GET HELP IF:  · You notice a change in the speed, rhythm, or strength of your heartbeat.  · You suddenly begin peeing (urinating) more often.  · You get tired more easily when moving or exercising.  GET HELP RIGHT AWAY IF:   · You have chest or belly (abdominal) pain.  · You feel sick to your stomach (nauseous).  · You are short of breath.  · You suddenly have swollen feet and ankles.  · You feel dizzy.  · You face, arms, or legs feel numb or weak.  · There is a change in your vision or speech.  MAKE SURE YOU:   · Understand these instructions.  · Will watch your condition.  · Will get help right away if you are not doing well or get worse.  Document Released: 02/10/2008 Document Revised: 09/17/2013 Document Reviewed: 06/13/2012  [employer]® Patient Information ©2015 [employer]. This information is not  intended to replace advice given to you by your health care provider. Make sure you discuss any questions you have with your health care provider.

## 2015-01-18 NOTE — ED Notes (Signed)
Pt refused to ambulate in hallway. Pt stated she uses a walker at home but refused to walk at all even with a walker because the doctor said she was to be discharged.

## 2015-01-18 NOTE — ED Notes (Signed)
PER EMS: pt reports SOB, worse with exertion since yesterday afternoon. Recurrence of this about once a month per pt report. O2 sat 95% RA, RR-16. Pt in a-fib, hx of afib, HR-104. A&Ox4.
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
## 2015-01-21 ENCOUNTER — Inpatient Hospital Stay (HOSPITAL_COMMUNITY)
Admission: EM | Admit: 2015-01-21 | Discharge: 2015-01-24 | DRG: 308 | Disposition: A | Discharge status: Home / Self Care | Payer: Self-pay | Attending: Internal Medicine | Admitting: Internal Medicine

## 2015-01-21 ENCOUNTER — Emergency Department (HOSPITAL_COMMUNITY): Payer: Self-pay

## 2015-01-21 ENCOUNTER — Encounter (HOSPITAL_COMMUNITY): Payer: Self-pay | Admitting: Emergency Medicine

## 2015-01-21 DIAGNOSIS — Z880 Allergy status to penicillin: Secondary | ICD-10-CM

## 2015-01-21 DIAGNOSIS — D539 Nutritional anemia, unspecified: Secondary | ICD-10-CM | POA: Diagnosis present

## 2015-01-21 DIAGNOSIS — Z9119 Patient's noncompliance with other medical treatment and regimen: Secondary | ICD-10-CM

## 2015-01-21 DIAGNOSIS — Z9114 Patient's other noncompliance with medication regimen: Secondary | ICD-10-CM | POA: Diagnosis present

## 2015-01-21 DIAGNOSIS — Z91199 Patient's noncompliance with other medical treatment and regimen due to unspecified reason: Secondary | ICD-10-CM

## 2015-01-21 DIAGNOSIS — Z91048 Other nonmedicinal substance allergy status: Secondary | ICD-10-CM

## 2015-01-21 DIAGNOSIS — I5023 Acute on chronic systolic (congestive) heart failure: Secondary | ICD-10-CM | POA: Diagnosis present

## 2015-01-21 DIAGNOSIS — Z79899 Other long term (current) drug therapy: Secondary | ICD-10-CM

## 2015-01-21 DIAGNOSIS — E871 Hypo-osmolality and hyponatremia: Secondary | ICD-10-CM | POA: Diagnosis present

## 2015-01-21 DIAGNOSIS — M13861 Other specified arthritis, right knee: Secondary | ICD-10-CM | POA: Diagnosis present

## 2015-01-21 DIAGNOSIS — Z7982 Long term (current) use of aspirin: Secondary | ICD-10-CM

## 2015-01-21 DIAGNOSIS — Z833 Family history of diabetes mellitus: Secondary | ICD-10-CM

## 2015-01-21 DIAGNOSIS — Z659 Problem related to unspecified psychosocial circumstances: Secondary | ICD-10-CM

## 2015-01-21 DIAGNOSIS — R569 Unspecified convulsions: Secondary | ICD-10-CM | POA: Diagnosis present

## 2015-01-21 DIAGNOSIS — L719 Rosacea, unspecified: Secondary | ICD-10-CM | POA: Diagnosis present

## 2015-01-21 DIAGNOSIS — E1165 Type 2 diabetes mellitus with hyperglycemia: Secondary | ICD-10-CM | POA: Diagnosis present

## 2015-01-21 DIAGNOSIS — Z6836 Body mass index (BMI) 36.0-36.9, adult: Secondary | ICD-10-CM

## 2015-01-21 DIAGNOSIS — E041 Nontoxic single thyroid nodule: Secondary | ICD-10-CM | POA: Diagnosis present

## 2015-01-21 DIAGNOSIS — R0602 Shortness of breath: Secondary | ICD-10-CM | POA: Diagnosis present

## 2015-01-21 DIAGNOSIS — Z91018 Allergy to other foods: Secondary | ICD-10-CM

## 2015-01-21 DIAGNOSIS — E119 Type 2 diabetes mellitus without complications: Secondary | ICD-10-CM | POA: Diagnosis present

## 2015-01-21 DIAGNOSIS — I4891 Unspecified atrial fibrillation: Secondary | ICD-10-CM

## 2015-01-21 DIAGNOSIS — I10 Essential (primary) hypertension: Secondary | ICD-10-CM | POA: Diagnosis present

## 2015-01-21 DIAGNOSIS — I481 Persistent atrial fibrillation: Principal | ICD-10-CM | POA: Diagnosis present

## 2015-01-21 DIAGNOSIS — I351 Nonrheumatic aortic (valve) insufficiency: Secondary | ICD-10-CM | POA: Diagnosis present

## 2015-01-21 DIAGNOSIS — I429 Cardiomyopathy, unspecified: Secondary | ICD-10-CM | POA: Diagnosis present

## 2015-01-21 DIAGNOSIS — E669 Obesity, unspecified: Secondary | ICD-10-CM | POA: Diagnosis present

## 2015-01-21 DIAGNOSIS — M13862 Other specified arthritis, left knee: Secondary | ICD-10-CM | POA: Diagnosis present

## 2015-01-21 DIAGNOSIS — I509 Heart failure, unspecified: Secondary | ICD-10-CM | POA: Insufficient documentation

## 2015-01-21 LAB — CBC
HCT: 35.7 % — ABNORMAL LOW (ref 36.0–46.0)
Hemoglobin: 11.4 g/dL — ABNORMAL LOW (ref 12.0–15.0)
MCH: 32.2 pg (ref 26.0–34.0)
MCHC: 31.9 g/dL (ref 30.0–36.0)
MCV: 100.8 fL — ABNORMAL HIGH (ref 78.0–100.0)
Platelets: 208 10*3/uL (ref 150–400)
RBC: 3.54 MIL/uL — ABNORMAL LOW (ref 3.87–5.11)
RDW: 15.3 % (ref 11.5–15.5)
WBC: 10.1 10*3/uL (ref 4.0–10.5)

## 2015-01-21 LAB — BASIC METABOLIC PANEL
Anion gap: 9 (ref 5–15)
BUN: 15 mg/dL (ref 6–20)
CO2: 19 mmol/L — ABNORMAL LOW (ref 22–32)
Calcium: 8.4 mg/dL — ABNORMAL LOW (ref 8.9–10.3)
Chloride: 106 mmol/L (ref 101–111)
Creatinine, Ser: 0.97 mg/dL (ref 0.44–1.00)
GFR calc Af Amer: >60 mL/min (ref >60)
GFR calc non Af Amer: >60 mL/min (ref >60)
Glucose, Bld: 258 mg/dL — ABNORMAL HIGH (ref 65–99)
Potassium: 4 mmol/L (ref 3.5–5.1)
Sodium: 134 mmol/L — ABNORMAL LOW (ref 135–145)

## 2015-01-21 LAB — I-STAT TROPONIN, ED: Troponin i, poc: 0.01 ng/mL (ref 0.00–0.08)

## 2015-01-21 NOTE — ED Notes (Signed)
Pt presents with [facility] for SOB and centralized Chest pressure x 3-4 days; pt reports being out of digoxin and has not taken it in 3-4 days; EMS reports patient was in Afib (rate of 130-180bpm); 10mg  Cardizem adminstered and patients HR now 90-110; pt reports improvement in breathing and pressure; pt sts she was seen here 3 days ago for same thing; VSS; NAD

## 2015-01-21 NOTE — ED Notes (Signed)
Pt had concerns of not being able to eat or drink; pt states it has been 6 hours since she had anything to eat or drink; pt was advised of Md order of NPO until after test results have come back and Md has able to reevaluate

## 2015-01-21 NOTE — ED Notes (Signed)
Pt given bedside commode with call bell within reach; pt verbalized understanding to use call bell when done or in need of help; pt given privacy

## 2015-01-21 NOTE — ED Notes (Signed)
Patient transported to XR. 

## 2015-01-21 NOTE — ED Notes (Signed)
Patient transported to X-ray with Denyse Amass, xray tech

## 2015-01-21 NOTE — ED Notes (Signed)
Pt upset because she wanted water. Pt was told that she needed to be evaluated first, per Dr. Clarene Duke.

## 2015-01-21 NOTE — ED Notes (Signed)
Pt back to room D34

## 2015-01-22 ENCOUNTER — Encounter (HOSPITAL_COMMUNITY): Payer: Self-pay | Admitting: Internal Medicine

## 2015-01-22 ENCOUNTER — Inpatient Hospital Stay (HOSPITAL_COMMUNITY): Payer: Self-pay

## 2015-01-22 DIAGNOSIS — I10 Essential (primary) hypertension: Secondary | ICD-10-CM

## 2015-01-22 DIAGNOSIS — I4891 Unspecified atrial fibrillation: Secondary | ICD-10-CM | POA: Insufficient documentation

## 2015-01-22 DIAGNOSIS — I5023 Acute on chronic systolic (congestive) heart failure: Secondary | ICD-10-CM

## 2015-01-22 DIAGNOSIS — I5022 Chronic systolic (congestive) heart failure: Secondary | ICD-10-CM

## 2015-01-22 DIAGNOSIS — Z9114 Patient's other noncompliance with medication regimen: Secondary | ICD-10-CM | POA: Insufficient documentation

## 2015-01-22 LAB — COMPREHENSIVE METABOLIC PANEL
ALT: 15 U/L (ref 14–54)
AST: 16 U/L (ref 15–41)
Albumin: 3.5 g/dL (ref 3.5–5.0)
Alkaline Phosphatase: 49 U/L (ref 38–126)
Anion gap: 7 (ref 5–15)
BUN: 13 mg/dL (ref 6–20)
CO2: 25 mmol/L (ref 22–32)
Calcium: 8.9 mg/dL (ref 8.9–10.3)
Chloride: 105 mmol/L (ref 101–111)
Creatinine, Ser: 0.99 mg/dL (ref 0.44–1.00)
GFR calc Af Amer: >60 mL/min (ref >60)
GFR calc non Af Amer: 59 mL/min — ABNORMAL LOW (ref >60)
Glucose, Bld: 228 mg/dL — ABNORMAL HIGH (ref 65–99)
Potassium: 3.7 mmol/L (ref 3.5–5.1)
Sodium: 137 mmol/L (ref 135–145)
Total Bilirubin: 1.4 mg/dL — ABNORMAL HIGH (ref 0.3–1.2)
Total Protein: 6.4 g/dL — ABNORMAL LOW (ref 6.5–8.1)

## 2015-01-22 LAB — GLUCOSE, CAPILLARY
Glucose-Capillary: 219 mg/dL — ABNORMAL HIGH (ref 65–99)
Glucose-Capillary: 172 mg/dL — ABNORMAL HIGH (ref 65–99)
Glucose-Capillary: 188 mg/dL — ABNORMAL HIGH (ref 65–99)
Glucose-Capillary: 157 mg/dL — ABNORMAL HIGH (ref 65–99)

## 2015-01-22 LAB — CBC
HCT: 36.1 % (ref 36.0–46.0)
Hemoglobin: 12 g/dL (ref 12.0–15.0)
MCH: 33.6 pg (ref 26.0–34.0)
MCHC: 33.2 g/dL (ref 30.0–36.0)
MCV: 101.1 fL — ABNORMAL HIGH (ref 78.0–100.0)
Platelets: 197 10*3/uL (ref 150–400)
RBC: 3.57 MIL/uL — ABNORMAL LOW (ref 3.87–5.11)
RDW: 15.7 % — ABNORMAL HIGH (ref 11.5–15.5)
WBC: 8.6 10*3/uL (ref 4.0–10.5)

## 2015-01-22 LAB — TROPONIN I
Troponin I: <0.03 ng/mL (ref <0.031)
Troponin I: <0.03 ng/mL (ref <0.031)

## 2015-01-22 LAB — BRAIN NATRIURETIC PEPTIDE: B Natriuretic Peptide: 872.8 pg/mL — ABNORMAL HIGH (ref 0.0–100.0)

## 2015-01-22 LAB — MRSA PCR SCREENING: MRSA by PCR: NEGATIVE

## 2015-01-22 LAB — TSH: TSH: 2.336 u[IU]/mL (ref 0.350–4.500)

## 2015-01-22 MED ORDER — POTASSIUM CHLORIDE CRYS ER 10 MEQ PO TBCR
20.0000 meq | EXTENDED_RELEASE_TABLET | Freq: Every day | ORAL | Status: DC
  Administered 2015-01-22 – 2015-01-23 (×2): 20 meq via ORAL
  Filled 2015-01-22 (×2): qty 2
  Filled 2015-01-22: qty 1

## 2015-01-22 MED ORDER — DILTIAZEM LOAD VIA INFUSION
10.0000 mg | Freq: Once | INTRAVENOUS | Status: AC
  Administered 2015-01-22 (×2): 10 mg via INTRAVENOUS
  Filled 2015-01-22: qty 10

## 2015-01-22 MED ORDER — INSULIN ASPART 100 UNIT/ML ~~LOC~~ SOLN
0.0000 [IU] | Freq: Three times a day (TID) | SUBCUTANEOUS | Status: DC
  Administered 2015-01-22: 3 [IU] via SUBCUTANEOUS
  Administered 2015-01-22 – 2015-01-23 (×5): 2 [IU] via SUBCUTANEOUS
  Administered 2015-01-24: 3 [IU] via SUBCUTANEOUS
  Administered 2015-01-24: 2 [IU] via SUBCUTANEOUS

## 2015-01-22 MED ORDER — FUROSEMIDE 10 MG/ML IJ SOLN
40.0000 mg | Freq: Once | INTRAMUSCULAR | Status: AC
  Administered 2015-01-22: 40 mg via INTRAVENOUS
  Filled 2015-01-22: qty 4

## 2015-01-22 MED ORDER — ACETAMINOPHEN 650 MG RE SUPP: 650.0000 mg | Freq: Four times a day (QID) | RECTAL | Status: DC | PRN

## 2015-01-22 MED ORDER — METOPROLOL SUCCINATE ER 25 MG PO TB24
25.0000 mg | ORAL_TABLET | Freq: Every day | ORAL | Status: DC
  Administered 2015-01-22 – 2015-01-24 (×3): 25 mg via ORAL
  Filled 2015-01-22 (×3): qty 1

## 2015-01-22 MED ORDER — DIGOXIN 125 MCG PO TABS
0.1250 mg | ORAL_TABLET | Freq: Every day | ORAL | Status: DC
  Administered 2015-01-22: 0.125 mg via ORAL
  Filled 2015-01-22: qty 1

## 2015-01-22 MED ORDER — INSULIN ASPART 100 UNIT/ML ~~LOC~~ SOLN: 0.0000 [IU] | Freq: Every day | SUBCUTANEOUS | Status: DC

## 2015-01-22 MED ORDER — LISINOPRIL 10 MG PO TABS
10.0000 mg | ORAL_TABLET | Freq: Every day | ORAL | Status: DC
  Administered 2015-01-22 – 2015-01-24 (×3): 10 mg via ORAL
  Filled 2015-01-22 (×3): qty 1

## 2015-01-22 MED ORDER — DILTIAZEM HCL 100 MG IV SOLR
5.0000 mg/h | INTRAVENOUS | Status: DC
  Administered 2015-01-22 (×2): 5 mg/h via INTRAVENOUS
  Filled 2015-01-22 (×2): qty 100

## 2015-01-22 MED ORDER — SODIUM CHLORIDE 0.9 % IJ SOLN: 3.0000 mL | INTRAMUSCULAR | Status: DC | PRN

## 2015-01-22 MED ORDER — SODIUM CHLORIDE 0.9 % IV SOLN: 250.0000 mL | INTRAVENOUS | Status: DC | PRN

## 2015-01-22 MED ORDER — DIPHENHYDRAMINE HCL 25 MG PO CAPS: 25.0000 mg | ORAL_CAPSULE | Freq: Three times a day (TID) | ORAL | Status: DC | PRN

## 2015-01-22 MED ORDER — ENOXAPARIN SODIUM 60 MG/0.6ML ~~LOC~~ SOLN
55.0000 mg | SUBCUTANEOUS | Status: DC
  Administered 2015-01-22 – 2015-01-24 (×3): 55 mg via SUBCUTANEOUS
  Filled 2015-01-22 (×3): qty 0.6

## 2015-01-22 MED ORDER — DIPHENHYDRAMINE HCL 25 MG PO TABS: 25.0000 mg | ORAL_TABLET | Freq: Three times a day (TID) | ORAL | Status: DC | PRN

## 2015-01-22 MED ORDER — DILTIAZEM HCL 30 MG PO TABS
30.0000 mg | ORAL_TABLET | Freq: Four times a day (QID) | ORAL | Status: DC
  Administered 2015-01-22 – 2015-01-24 (×7): 30 mg via ORAL
  Filled 2015-01-22 (×7): qty 1

## 2015-01-22 MED ORDER — ACETAMINOPHEN 325 MG PO TABS: 650.0000 mg | ORAL_TABLET | Freq: Four times a day (QID) | ORAL | Status: DC | PRN

## 2015-01-22 MED ORDER — SODIUM CHLORIDE 0.9 % IJ SOLN: 3.0000 mL | Freq: Two times a day (BID) | INTRAMUSCULAR | Status: DC

## 2015-01-22 MED ORDER — ASPIRIN 325 MG PO TABS
325.0000 mg | ORAL_TABLET | Freq: Every day | ORAL | Status: DC
  Administered 2015-01-22 – 2015-01-24 (×3): 325 mg via ORAL
  Filled 2015-01-22 (×3): qty 1

## 2015-01-22 MED ORDER — FUROSEMIDE 40 MG PO TABS
40.0000 mg | ORAL_TABLET | Freq: Two times a day (BID) | ORAL | Status: DC
  Administered 2015-01-23 – 2015-01-24 (×3): 40 mg via ORAL
  Filled 2015-01-22 (×4): qty 1

## 2015-01-22 MED ORDER — SODIUM CHLORIDE 0.9 % IJ SOLN
3.0000 mL | Freq: Two times a day (BID) | INTRAMUSCULAR | Status: DC
  Administered 2015-01-23 – 2015-01-24 (×2): 3 mL via INTRAVENOUS

## 2015-01-22 NOTE — Progress Notes (Signed)
Pt refused bedside ECHO per tech.
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
## 2015-01-22 NOTE — Care Management Note (Signed)
Case Management Note  Patient Details  Name: Weslee Ellery MRN: [id number] Date of Birth: November 11, 1950  Subjective/Objective:      Adm w shortness of breath             Action/Plan: spoke w pt. She moved to [place] last ocotber from [place]. She has applied for [place] medicaid about 30days ago and has applied for disability. Gets meds del to home by [facility] and they are reasonable she states.   Expected Discharge Date:                  Expected Discharge Plan:  Home/Self Care  In-House Referral:     Discharge planning Services  CM Consult, Indigent Health Clinic  Post Acute Care Choice:    Choice offered to:     DME Arranged:    DME Agency:     HH Arranged:    HH Agency:     Status of Service:     Medicare Important Message Given:    Date Medicare IM Given:    Medicare IM give by:    Date Additional Medicare IM Given:    Additional Medicare Important Message give by:     If discussed at Long Length of Stay Meetings, dates discussed:    Additional Comments:gave her inform on [facility] and [facility] also.  Hanley Hays, RN 01/22/2015, 9:53 AM
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
## 2015-01-22 NOTE — ED Notes (Addendum)
Tech Renae Fickle) gave ice chips to pt, per Minerva Areola - RN/Charge RN

## 2015-01-22 NOTE — ED Notes (Signed)
Kim MD at bedside

## 2015-01-22 NOTE — Progress Notes (Signed)
   [facility]                                                                              Patient Demographics  Kaitlyn [name], is a 64 y.o. female, DOB - 1950/08/21, YBW:[id number]  Admit date - 01/21/2015   Admitting Physician Pearson Grippe, MD  Outpatient Primary MD for the patient is No PCP Per Patient  LOS - 0   Chief Complaint  Patient presents with  . Shortness of Breath  . Chest Pain      HPI on 01/22/2015 by Dr. Pearson Grippe 64 yo female with hx of chronic afib, CHF (EF 15-20%), Dm2, apparrently c/o dyspnea for the past 3-4 days. Pt denies weight gain, cp, orthopnea, pnd. Pt has recently ran out of medication. Out of lisinopril x 35month, and out of digoxin for 1 week. Pt came to ED and was found to be in Afib with RVR. Pt was started on cardizem drip and feels much better. Pt will be admitted for afib with RVR.   Assessment & Plan   Patient admitted early this morning by Dr. Pearson Grippe.  Please see full H&P for details  Dyspnea secondary to A. fib with RVR -Continue Cardizem drip -Digoxin restarted -Likely secondary to noncompliance of medications -Echocardiogram pending  Chronic systolic heart failure Cardiomyopathy -Echocardiogram 10/10/2014 shows an EF of 20-25%, diffuse hypokinesis -Monitor intake and output, daily weights -Continue lisinopril, aspirin, Lasix -Metoprolol held as patient is currently on Cardizem drip  Diabetes mellitus, type II -Glipizide held, continue insulin sliding scale CBG monitoring  Chronic macrocytic Anemia -Globin appears to be stable, continue to monitor CBC  Hyponatremia  -Resolved  Thyroid nodule -Patient refused thyroid ultrasound -TSH 2.336  Code Status: Full  Family Communication: None bedside  Disposition Plan: Admitted  Time Spent in minutes   30 minutes  Procedures  None  Consults   Cardiology  DVT Prophylaxis  Lovenox  Ulysess Witz D.O. on 01/22/2015 at 2:05 PM  Between 7am to 7pm - Pager -  [phone]  After 7pm go to [email or web address] - password [email or web address]  And look for the night coverage person covering for me after hours  [facility] Office  [phone]

## 2015-01-22 NOTE — Discharge Instructions (Signed)

## 2015-01-23 ENCOUNTER — Inpatient Hospital Stay (HOSPITAL_COMMUNITY): Payer: Self-pay

## 2015-01-23 DIAGNOSIS — Z9119 Patient's noncompliance with other medical treatment and regimen: Secondary | ICD-10-CM

## 2015-01-23 DIAGNOSIS — E1165 Type 2 diabetes mellitus with hyperglycemia: Secondary | ICD-10-CM

## 2015-01-23 DIAGNOSIS — R0602 Shortness of breath: Secondary | ICD-10-CM

## 2015-01-23 DIAGNOSIS — I509 Heart failure, unspecified: Secondary | ICD-10-CM

## 2015-01-23 LAB — CBC
HCT: 36.2 % (ref 36.0–46.0)
Hemoglobin: 11.8 g/dL — ABNORMAL LOW (ref 12.0–15.0)
MCH: 33.1 pg (ref 26.0–34.0)
MCHC: 32.6 g/dL (ref 30.0–36.0)
MCV: 101.4 fL — ABNORMAL HIGH (ref 78.0–100.0)
Platelets: 205 10*3/uL (ref 150–400)
RBC: 3.57 MIL/uL — ABNORMAL LOW (ref 3.87–5.11)
RDW: 15.8 % — ABNORMAL HIGH (ref 11.5–15.5)
WBC: 8.3 10*3/uL (ref 4.0–10.5)

## 2015-01-23 LAB — BASIC METABOLIC PANEL
Anion gap: 6 (ref 5–15)
BUN: 15 mg/dL (ref 6–20)
CO2: 26 mmol/L (ref 22–32)
Calcium: 8.9 mg/dL (ref 8.9–10.3)
Chloride: 106 mmol/L (ref 101–111)
Creatinine, Ser: 1.09 mg/dL — ABNORMAL HIGH (ref 0.44–1.00)
GFR calc Af Amer: >60 mL/min (ref >60)
GFR calc non Af Amer: 52 mL/min — ABNORMAL LOW (ref >60)
Glucose, Bld: 186 mg/dL — ABNORMAL HIGH (ref 65–99)
Potassium: 3.8 mmol/L (ref 3.5–5.1)
Sodium: 138 mmol/L (ref 135–145)

## 2015-01-23 LAB — GLUCOSE, CAPILLARY
Glucose-Capillary: 193 mg/dL — ABNORMAL HIGH (ref 65–99)
Glucose-Capillary: 176 mg/dL — ABNORMAL HIGH (ref 65–99)
Glucose-Capillary: 179 mg/dL — ABNORMAL HIGH (ref 65–99)
Glucose-Capillary: 158 mg/dL — ABNORMAL HIGH (ref 65–99)

## 2015-01-23 LAB — HEMOGLOBIN A1C
Hgb A1c MFr Bld: 9.6 % — ABNORMAL HIGH (ref 4.8–5.6)
Mean Plasma Glucose: 229 mg/dL

## 2015-01-23 LAB — TROPONIN I: Troponin I: <0.03 ng/mL (ref <0.031)

## 2015-01-23 NOTE — Progress Notes (Signed)
Advanced Heart Failure Rounding Note  PCP: No PCP Primary Cardiologist: Dr Shirlee Latch but is non-compliant.  Subjective:    Kaitlyn [name] presented to the ED on 9/7 for dyspnea in setting of medical non compliance.  Was found to be in afib RVR and admitted for further evaluation and treated.  Last admitted from 8/7 to 12/25/2014 for A/C systolic HF. She was sent home on digoxin 0.125mg , lasix 40mg  BID,  lisinopril 5mg , and Toprol XL 100mg . She diuresed 15 L and lost 25 lbs on 40 mg IV lasix BID. Discharge weight was 250 lbs.  Refused echo this morning due to cost and having one done in May. Also initially refused lasix, because she was eating. Just received 40 mg po lasix.  She says her breathing feels much better after IV dosing of cardizem and now on po. No further tachypalpitations.  Wants to go home tomorrow.   Weight shows up 3 lbs despite output of 2.3 L.  On 40 mg po lasix BID.   Objective:   Weight Range: 263 lb 6.4 oz (119.477 kg) Body mass index is 36.75 kg/(m^2).   Vital Signs:   Temp:  [97.4 F (36.3 C)-98.1 F (36.7 C)] 97.6 F (36.4 C) (09/08 0522) Pulse Rate:  [70-97] 97 (09/08 0522) Resp:  [17-31] 20 (09/08 0522) BP: (105-132)/(66-82) 116/76 mmHg (09/08 0522) SpO2:  [94 %-98 %] 97 % (09/08 0522) Weight:  [263 lb 6.4 oz (119.477 kg)-263 lb 12.8 oz (119.659 kg)] 263 lb 6.4 oz (119.477 kg) (09/08 0522) Last BM Date: 01/22/15  Weight change: Filed Weights   01/22/15 0635 01/22/15 2136 01/23/15 0522  Weight: 260 lb 8 oz (118.162 kg) 263 lb 12.8 oz (119.659 kg) 263 lb 6.4 oz (119.477 kg)    Intake/Output:   Intake/Output Summary (Last 24 hours) at 01/23/15 0943 Last data filed at 01/23/15 0809  Gross per 24 hour  Intake 545.25 ml  Output   2551 ml  Net -2005.75 ml     Physical Exam: General: Pleasant, NAD. Psych: Normal affect. HEENT: Normal Neck: Supple without bruits. JVD 10 cm Heart: Irregularly irregular rhythm.  no s3, s4, or murmurs  appreciated. Lungs: Crackles bibasilar Abdomen: Soft, non-tender, non-distended, BS + x 4.  Extremities: No clubbing, cyanosis or edema. DP/PT/Radials 2+ and equal bilaterally. 2+ edema to mid shin bilaterally. Neuro: Alert and oriented X 3. Moves all extremities spontaneously.  Telemetry:  Afib 90s  Labs: CBC  Recent Labs  01/22/15 0813 01/23/15 0434  WBC 8.6 8.3  HGB 12.0 11.8*  HCT 36.1 36.2  MCV 101.1* 101.4*  PLT 197 205   Basic Metabolic Panel  Recent Labs  01/22/15 0813 01/23/15 0434  NA 137 138  K 3.7 3.8  CL 105 106  CO2 25 26  GLUCOSE 228* 186*  BUN 13 15  CALCIUM 8.9 8.9   Liver Function Tests  Recent Labs  01/22/15 0813  AST 16  ALT 15  ALKPHOS 49  BILITOT 1.4*  PROT 6.4*  ALBUMIN 3.5   No results for input(s): LIPASE, AMYLASE in the last 72 hours. Cardiac Enzymes  Recent Labs  01/22/15 0813 01/22/15 1350 01/22/15 2344  TROPONINI <0.03 <0.03 <0.03    BNP: BNP (last 3 results)  Recent Labs  12/22/14 0831 01/18/15 0115 01/22/15 0212  BNP 1240.2* 943.2* 872.8*    ProBNP (last 3 results)  Recent Labs  03/21/14 2126 03/22/14 1312 04/27/14 0128  PROBNP 2946.0* 3181.0* 5351.0*     D-Dimer No results for input(s): DDIMER  in the last 72 hours. Hemoglobin A1C  Recent Labs  01/22/15 0813  HGBA1C 9.6*   Fasting Lipid Panel No results for input(s): CHOL, HDL, LDLCALC, TRIG, CHOLHDL, LDLDIRECT in the last 72 hours. Thyroid Function Tests  Recent Labs  01/22/15 0813  TSH 2.336    Other results:     Imaging/Studies:  Dg Chest 2 View  01/21/2015   CLINICAL DATA:  Chest pain and shortness of breath for 3 days  EXAM: CHEST  2 VIEW  COMPARISON:  01/18/15  FINDINGS: Stable moderate cardiac silhouette enlargement. Vascular pattern normal. Lungs clear. Trace right pleural effusion.  IMPRESSION: Trace right pleural effusion, otherwise no change from prior study.   Electronically Signed   By: Esperanza Heir M.D.   On:  01/21/2015 22:18     Latest Echo  Latest Cath   Medications:     Scheduled Medications: . aspirin  325 mg Oral Daily  . diltiazem  30 mg Oral 4 times per day  . enoxaparin (LOVENOX) injection  55 mg Subcutaneous Q24H  . furosemide  40 mg Oral BID  . insulin aspart  0-5 Units Subcutaneous QHS  . insulin aspart  0-9 Units Subcutaneous TID WC  . lisinopril  10 mg Oral Daily  . metoprolol succinate  25 mg Oral Daily  . potassium chloride SA  20 mEq Oral Daily  . sodium chloride  3 mL Intravenous Q12H     Infusions: . diltiazem (CARDIZEM) infusion Stopped (01/22/15 1830)     PRN Medications:  sodium chloride, acetaminophen **OR** acetaminophen, diphenhydrAMINE, sodium chloride   Assessment/Plan   Kaitlyn [name] is a 64 y.o. female with history of chronic afib, systolic HF, and DM who presented to with Afib-RVR and volume overload in the setting of medical non compliance. HF team was consulted to assist in managing her fluid status. She has had 12 ED visits and 8 admissions in the past 6 months. She has a long history of medical non compliance and will sometimes sign out of admissions AMA.  1. Chronic a fib - This patients CHA2DS2-VASc Score and unadjusted Ischemic Stroke Rate (% per year) is at least 4.8 % stroke rate/year from a score of 4 (1 point each [CHF, HTN, DM, Vascular=MI/PAD/Aortic Plaque, Age if 65-74, or Female]; 2 points each [Age > 75, or Stroke/TIA/TE]) - She is NOT a good anticoagulation candidate due to non compliance and inability to afford novel oral anticoags at this time. - Continue ASA 325 mg daily 2. Chronic systolic HF (EF 20-25% 10/10/14) - She is volume overloaded on exam.   - Just received 40 mg po lasix and resistant to switching back to IV.  Said she would accept IV dose this afternoon, as long as we are not "double dosing her."    Would consider 40-80 mg of IV lasix if pt insistent on going home tomorrow. Will follow Cr. - Refusing echo this  admission. Says "I can't afford it, and had one just a few months ago." - She refuses metoprolol, saying she feels better when taking diltiazem. - Had been off of her digoxin, lisinopril, and lasix for up to a month prior to admission. Unclear timeline with multiple answers given. - She is NOT a candidate for advanced procedures with recurrent noncompliance and refusal of medical procedures. 3. DM - Poorly controlled in setting of non-compliance - Per IM 4. Social - Non-compliance is major issue to her care.   Length of Stay: 1   Mariam Dollar [facility] PA-C  01/23/2015, 9:43 AM  Advanced Heart Failure Team Pager [phone] (M-F; 7a - 4p)  Please contact [facility] for night-coverage after hours (4p -7a ) and weekends on [email or web address]  Patient seen with PA, agree with the above note.  Kaitlyn [name] has been re-admitted with atrial fibrillation/RVR and acute on chronic systolic CHF. She has had multiple admissions with the same.  She is noncompliant with meds at home and refuses to followup in the outpatient setting. 1. Acute on chronic systolic CHF: EF 41-74%.  She says that she will be leaving tomorrow. She still has volume overload on exam.  Would like to get as much fluid off her as possible before she goes home.  I do not think she needs another echo.  - Lasix 60 mg IV x 1, then resume 40 mg po bid.  When she takes Lasix at home, she takes 40 daily.  She would be willing to increase to 40 mg po bid.  - Continue digoxin, Toprol XL, lisinopril. 2. Atrial fibrillation: Mild RVR initially, now HR in 90s.  "Always feels great" when she gets diltiazem.  I have been very reticent to give her diltiazem with EF 20-25% but she is insistent.  Has tolerated in hospital.  Would let her go home on diltiazem CD 120 mg daily.  She is not candidate for anticoagulation due to noncompliance.   3. Noncompliance: Marked noncompliance, will not be seen in the outpatient setting and takes medications as she thinks  they should be taken.  High risk for future admission.   [name] 01/23/2015 5:33 PM

## 2015-01-23 NOTE — Progress Notes (Signed)
Inpatient Diabetes Program Recommendations  AACE/ADA: New Consensus Statement on Inpatient Glycemic Control (2013)  Target Ranges:  Prepandial:   less than 140 mg/dL      Peak postprandial:   less than 180 mg/dL (1-2 hours)      Critically ill patients:  140 - 180 mg/dL    Consult Note:  Went to see and speak with patient in regards to consult and A1c level 9.6%. Explained what an A1c is and informed patient of her level. Patient reports she does not have a PCP. Patient reports that she does not have transportation to MD visits. Patient reports she has had a very hard year. Loosing her house and moving down here to [place]. She does not like living here in [place] but it is close to her cousin in [place], [place].  Patient was on Glyburide at one time but had lows and was taken off of it.   Patient gets a Radio producer every month worth $1,155. Of that $600 for rent, $70 something else, what's left is what she has for groceries and meds. She mentions this month she will run out of money before the end of the month but her cousin usually gives $50 more dollars to carry her over.   Food she buys is the sale foods at [employer] which is delivered to her house. She eats one Location manager biscuit for breakfast, one individual frozen pizza with fruit cup in juice for lunch, and a soup and fruit cup for dinner. The patient stretches out food. She is unable to alter her eating at this time.  Last time she was in the hospital, the CM helped her fill out paperwork to transfer her medicaid to [place]. She mentioned that she turns 64 in December and will qualify for Medicare. She has goals to call meals on wheels that her cousin tells her about and she is going to inquire about food stamps. I spoke to her about her dietary choices when she is able and has the resources.   My heart goes out to her. Will need transportation to any follow up appointment and will not have any money to be able to give.  Thanks,  Christena Deem RN, MSN, [facility] Inpatient Diabetes Coordinator Team Pager [phone]

## 2015-01-23 NOTE — Progress Notes (Signed)
Paged concerning spikes of HRs up into 150s but unsustained.  On my arrival Kaitlyn [name] was sitting up in bed in no distress, stating she had gotten up to use the bathroom and was moving around the room frequently.  She denies tachypalpitations, SOB, CP, lightheadedness or dizziness.  By tele appears to be double counting on pt movement 2/2 to artifact.  Stable in her chronic afib in rates of 100 currently.   Casimiro Needle [address] [place], PA-C 01/23/2015 1:49 PM

## 2015-01-23 NOTE — Progress Notes (Signed)
Pt transferred from 2H, pt a/o, no c/o pain, VSS, pt stable

## 2015-01-23 NOTE — Progress Notes (Addendum)
[facility]                                                                              Patient Demographics  Kaitlyn [name], is a 64 y.o. female, DOB - 1950/06/07, FOY:[id number]  Admit date - 01/21/2015   Admitting Physician Pearson Grippe, MD  Outpatient Primary MD for the patient is No PCP Per Patient  LOS - 1   Chief Complaint  Patient presents with  . Shortness of Breath  . Chest Pain      HPI on 01/22/2015 by Dr. Pearson Grippe 64 yo female with hx of chronic afib, CHF (EF 15-20%), Dm2, apparrently c/o dyspnea for the past 3-4 days. Pt denies weight gain, cp, orthopnea, pnd. Pt has recently ran out of medication. Out of lisinopril x 3month, and out of digoxin for 1 week. Pt came to ED and was found to be in Afib with RVR. Pt was started on cardizem drip and feels much better. Pt will be admitted for afib with RVR.  Assessment & Plan   Dyspnea secondary to A. fib with RVR -Patient feels her shortness of breath has resolved -Cardiology consulted and appreciated -Patient transitioned off of Cardizem drip and oral Cardizem started 30 mg every 6 hours -Digoxin discontinued -Likely secondary to noncompliance of medications -Echocardiogram pending -Not a candidate for NOAC due to noncompliance  Acute on Chronic systolic heart failureCardiomyopathy -Echocardiogram 10/10/2014 shows an EF of 20-25%, diffuse hypokinesis -Monitor intake and output, daily weights -Continue lisinopril, aspirin, Lasix, metoprolol -UO over past 24 hours: 3175cc  Diabetes mellitus, type II -Glipizide held, continue insulin sliding scale CBG monitoring -HbA1c 9.6  Chronic macrocytic Anemia -hemoglobin appears to be stable, currently 11.8 -continue to monitor CBC  Hyponatremia  -Resolved  Thyroid nodule -Patient refused thyroid ultrasound -TSH 2.336  Code Status: Full  Family Communication: None bedside  Disposition Plan: Admitted. Wants to go home tomorrow.   Time Spent in minutes  30 minutes  Procedures  None  Consults  Cardiology  DVT Prophylaxis Lovenox  Lab Results  Component Value Date   PLT 205 01/23/2015    Medications  Scheduled Meds: . aspirin  325 mg Oral Daily  . diltiazem  30 mg Oral 4 times per day  . enoxaparin (LOVENOX) injection  55 mg Subcutaneous Q24H  . furosemide  40 mg Oral BID  . insulin aspart  0-5 Units Subcutaneous QHS  . insulin aspart  0-9 Units Subcutaneous TID WC  . lisinopril  10 mg Oral Daily  . metoprolol succinate  25 mg Oral Daily  . potassium chloride  20 mEq Oral Daily  . sodium chloride  3 mL Intravenous Q12H   Continuous Infusions: . diltiazem (CARDIZEM) infusion Stopped (01/22/15 1830)   PRN Meds:.sodium chloride, acetaminophen **OR** acetaminophen, diphenhydrAMINE, sodium chloride  Antibiotics   Anti-infectives    None      Subjective:   Kaitlyn [name] seen and examined today.  Patient states her breathing has improved and she denies chest pain, abdominal pain, dizziness, headache.    Objective:   Filed Vitals:   01/22/15 2136 01/23/15 0200 01/23/15 0522 01/23/15 0900  BP: 118/79 127/80 116/76 96/62  Pulse: 96 94 97  84  Temp: 98.1 F (36.7 C) 98.1 F (36.7 C) 97.6 F (36.4 C) 97.8 F (36.6 C)  TempSrc: Oral Oral Oral Oral  Resp: 20 20 20 24   Height: 5\' 11"  (1.803 m)     Weight: 119.659 kg (263 lb 12.8 oz)  119.477 kg (263 lb 6.4 oz)   SpO2: 96% 94% 97% 95%    Wt Readings from Last 3 Encounters:  01/23/15 119.477 kg (263 lb 6.4 oz)  01/04/15 113.399 kg (250 lb)  12/25/14 113.717 kg (250 lb 11.2 oz)     Intake/Output Summary (Last 24 hours) at 01/23/15 1152 Last data filed at 01/23/15 1006  Gross per 24 hour  Intake 765.25 ml  Output   2551 ml  Net -1785.75 ml    Exam  General: Well developed, well nourished, NAD, appears stated age  HEENT: NCAT, mucous membranes moist.   Neck: Supple, + JVD, no masses  Cardiovascular: S1 S2 auscultated, IRR, no murmurs  appreciated  Respiratory: Bibasilar crackles, no wheezing  Abdomen: Soft, obese, nontender, nondistended, + bowel sounds  Extremities: warm dry without cyanosis clubbing. 2+ edema LE B/L  Neuro: AAOx3, nonfocal  Psych: Normal affect and demeanor   Data Review   Micro Results Recent Results (from the past 240 hour(s))  MRSA PCR Screening     Status: None   Collection Time: 01/22/15  6:38 AM  Result Value Ref Range Status   MRSA by PCR NEGATIVE NEGATIVE Final    Comment:        The GeneXpert MRSA Assay (FDA approved for NASAL specimens only), is one component of a comprehensive MRSA colonization surveillance program. It is not intended to diagnose MRSA infection nor to guide or monitor treatment for MRSA infections.     Radiology Reports Dg Chest 2 View  01/21/2015   CLINICAL DATA:  Chest pain and shortness of breath for 3 days  EXAM: CHEST  2 VIEW  COMPARISON:  01/18/15  FINDINGS: Stable moderate cardiac silhouette enlargement. Vascular pattern normal. Lungs clear. Trace right pleural effusion.  IMPRESSION: Trace right pleural effusion, otherwise no change from prior study.   Electronically Signed   By: Esperanza Heir M.D.   On: 01/21/2015 22:18   Dg Chest 2 View  01/18/2015   CLINICAL DATA:  Dyspnea for 2 days.  EXAM: CHEST  2 VIEW  COMPARISON:  12/22/2014  FINDINGS: There is unchanged cardiomegaly. There is mild hyperinflation. Minimal linear basilar opacities are present on the right, scarring versus atelectasis. There is no confluent alveolar opacity. There is no effusion. Pulmonary vasculature is normal.  IMPRESSION: Unchanged cardiomegaly. Minimal linear scarring or atelectasis in the right base.   Electronically Signed   By: Ellery Plunk M.D.   On: 01/18/2015 01:41    CBC  Recent Labs Lab 01/18/15 0115 01/21/15 2049 01/22/15 0813 01/23/15 0434  WBC 13.5* 10.1 8.6 8.3  HGB 11.5* 11.4* 12.0 11.8*  HCT 35.8* 35.7* 36.1 36.2  PLT 193 208 197 205  MCV 101.1*  100.8* 101.1* 101.4*  MCH 32.5 32.2 33.6 33.1  MCHC 32.1 31.9 33.2 32.6  RDW 14.9 15.3 15.7* 15.8*    Chemistries   Recent Labs Lab 01/18/15 0115 01/21/15 2049 01/22/15 0813 01/23/15 0434  NA 133* 134* 137 138  K 4.0 4.0 3.7 3.8  CL 104 106 105 106  CO2 22 19* 25 26  GLUCOSE 259* 258* 228* 186*  BUN 14 15 13 15   CREATININE 0.91 0.97 0.99 1.09*  CALCIUM 8.4* 8.4* 8.9  8.9  AST  --   --  16  --   ALT  --   --  15  --   ALKPHOS  --   --  49  --   BILITOT  --   --  1.4*  --    ------------------------------------------------------------------------------------------------------------------ estimated creatinine clearance is 74.3 mL/min (by C-G formula based on Cr of 1.09). ------------------------------------------------------------------------------------------------------------------  Recent Labs  01/22/15 0813  HGBA1C 9.6*   ------------------------------------------------------------------------------------------------------------------ No results for input(s): CHOL, HDL, LDLCALC, TRIG, CHOLHDL, LDLDIRECT in the last 72 hours. ------------------------------------------------------------------------------------------------------------------  Recent Labs  01/22/15 0813  TSH 2.336   ------------------------------------------------------------------------------------------------------------------ No results for input(s): VITAMINB12, FOLATE, FERRITIN, TIBC, IRON, RETICCTPCT in the last 72 hours.  Coagulation profile No results for input(s): INR, PROTIME in the last 168 hours.  No results for input(s): DDIMER in the last 72 hours.  Cardiac Enzymes  Recent Labs Lab 01/22/15 0813 01/22/15 1350 01/22/15 2344  TROPONINI <0.03 <0.03 <0.03   ------------------------------------------------------------------------------------------------------------------ Invalid input(s): POCBNP    Theron Cumbie D.O. on 01/23/2015 at 11:52 AM  Between 7am to 7pm - Pager -  [phone]  After 7pm go to [email or web address] - password [email or web address]  And look for the night coverage person covering for me after hours  [facility] Office  [phone]

## 2015-01-23 NOTE — Progress Notes (Signed)
Pt HR periodically going up to 150's nonsustained.  PA notified, will continue to monitor.

## 2015-01-23 NOTE — Care Management Note (Signed)
Case Management Note  Patient Details  Name: Kaitlyn [name] MRN: [id number] Date of Birth: 12/23/1950  Subjective/Objective:    Admitted with SOB                Action/Plan: Patient known from previous admissions   01/23/2015 CM following for DCP; Patient does not qualify for the [facility] fund due to recently used the medication assistance program and patient's can only use the [facility] fund once a year. Will contact the Transitional Nurse at the [facility] to see patient. ( pt refused for her to make her a follow up apt last admission).   01/22/2015 Action/Plan: spoke w pt. She moved to [place] last ocotber from [place]. She has applied for [place] medicaid about 30days ago and has applied for disability. Gets meds [place] [facility] and they are reasonable she states. Pablo Ledger RN   12/24/2014  Talked to patient about DCP; patient refused to have apt arranged for her to have a PCP, CM informed her of the importance of having a PCP for Medicaid/ Disability which she states that she is trying to get; patient stated that [facility] would [facility]. Patient refused any HHC or DME at this time. Patient does not qualify for the Medication Assistance Fund because she has recently used it. B Ladavia Lindenbaum RN,MHA,BSN  CM note from 11/05/2014 Talked to pt about follow up care. Patient lives alone in apt, use a walker and a cab at times for transportation. Otherwise she does not come out of her home. Grocery is delivered to her home from [employer], she calls someone from her apt to take her trash out. Patient does not like follow up care and does not think that it is necessary for her health. CM informed the patient of the importance of follow up care and because of that there is a lapse in her care which is causing frequent hospitalizations. Patient finally agreed to go to the [facility] and Wellness program. Shanda Bumps with the [facility] at the [facility] contacted for  an apt. CM encouraged patient to apply for Medicaid of [place] for medical insurance/ prescription drug coverage. Long discussion with patient about taking care of herself. CM again talked to the patient of the importance of having a PCP and that the hospital will take care of emergency issues and her PCP will manage her care outside of the hospital. Patient stated several times, " I took myself off of certain medication,". CM informed her that the problem is she is not to take herself off of her medication without a physicians order. Patient recently was discharged from [facility] and received the [facility] fund ( Medication Assistance Through [facility]) and she does not qualify for assistance again. She can only use the MATCH fund once a year. Patient stated that she has all of her medication at home except Eliquis. Patient did not follow up with Eliquis medication assistance program after the 30 day free trail of medication.  CM note from 11/05/2014 Valentina Lucks RN - NCM aware of pt arrival. Pt has refused [facility] assistance. She has been MATCHed within last year and has not changed Medicaid from [place] to [place] as of this time. NCM will continue to follow until disposition determined. Camellia J. Lucretia Roers, RN, BSN, [employer] [phone]  CM note from 10/11/2014 - H Mayo RN - Appt was arranged by ED CM with [name] -  pt did not keep appt. Medications were also provided. Pt has not gone to DSS to change [place] Medicaid to [facility] - states she is sure she will be able to use [place] Medicaid to fill scripts. Per pt, she lived with her mother who had a reverse mortgage on her house and had to move out when her mother died - chose [place] because she has cousins in [place]. States she has a problem with transportation - explained that [place] Medicaid includes a transportation benefit and that most medication copays will be $3.00 or less.         Expected Discharge Date:    possibly 01/25/2015             Expected Discharge Plan:  Home/Self Care  Discharge planning Services  CM Consult, [facility]igent [facility] Clinic  Status of Service:    In progress     Cherrie Distance, RN 01/23/2015, 10:05 AM

## 2015-01-23 NOTE — CV Procedure (Signed)
Patient is refusing her echo at this time; her Nurse was notified.   Leta Jungling [facility] 01/23/2015 8:38

## 2015-01-24 ENCOUNTER — Inpatient Hospital Stay (HOSPITAL_COMMUNITY): Payer: Self-pay

## 2015-01-24 LAB — BASIC METABOLIC PANEL
Anion gap: 7 (ref 5–15)
BUN: 13 mg/dL (ref 6–20)
CO2: 28 mmol/L (ref 22–32)
Calcium: 8.8 mg/dL — ABNORMAL LOW (ref 8.9–10.3)
Chloride: 102 mmol/L (ref 101–111)
Creatinine, Ser: 1.03 mg/dL — ABNORMAL HIGH (ref 0.44–1.00)
GFR calc Af Amer: >60 mL/min (ref >60)
GFR calc non Af Amer: 56 mL/min — ABNORMAL LOW (ref >60)
Glucose, Bld: 192 mg/dL — ABNORMAL HIGH (ref 65–99)
Potassium: 3.6 mmol/L (ref 3.5–5.1)
Sodium: 137 mmol/L (ref 135–145)

## 2015-01-24 LAB — CBC
HCT: 36.8 % (ref 36.0–46.0)
Hemoglobin: 11.8 g/dL — ABNORMAL LOW (ref 12.0–15.0)
MCH: 32.5 pg (ref 26.0–34.0)
MCHC: 32.1 g/dL (ref 30.0–36.0)
MCV: 101.4 fL — ABNORMAL HIGH (ref 78.0–100.0)
Platelets: 215 10*3/uL (ref 150–400)
RBC: 3.63 MIL/uL — ABNORMAL LOW (ref 3.87–5.11)
RDW: 15.7 % — ABNORMAL HIGH (ref 11.5–15.5)
WBC: 8.3 10*3/uL (ref 4.0–10.5)

## 2015-01-24 LAB — GLUCOSE, CAPILLARY
Glucose-Capillary: 166 mg/dL — ABNORMAL HIGH (ref 65–99)
Glucose-Capillary: 210 mg/dL — ABNORMAL HIGH (ref 65–99)

## 2015-01-24 MED ORDER — GLIPIZIDE 5 MG PO TABS: 5.0000 mg | ORAL_TABLET | Freq: Every day | ORAL | Status: DC

## 2015-01-24 MED ORDER — DIGOXIN 125 MCG PO TABS
0.1250 mg | ORAL_TABLET | Freq: Every day | ORAL | Status: DC
  Administered 2015-01-24: 0.125 mg via ORAL
  Filled 2015-01-24: qty 1

## 2015-01-24 MED ORDER — POTASSIUM CHLORIDE CRYS ER 20 MEQ PO TBCR: 20.0000 meq | EXTENDED_RELEASE_TABLET | Freq: Two times a day (BID) | ORAL | Status: DC

## 2015-01-24 MED ORDER — POTASSIUM CHLORIDE CRYS ER 20 MEQ PO TBCR
20.0000 meq | EXTENDED_RELEASE_TABLET | Freq: Two times a day (BID) | ORAL | Status: DC
  Filled 2015-01-24: qty 1

## 2015-01-24 MED ORDER — DIGOXIN 125 MCG PO TABS: 0.1250 mg | ORAL_TABLET | Freq: Every day | ORAL | Status: DC

## 2015-01-24 MED ORDER — FUROSEMIDE 40 MG PO TABS: 40.0000 mg | ORAL_TABLET | Freq: Two times a day (BID) | ORAL | Status: DC

## 2015-01-24 MED ORDER — LISINOPRIL 10 MG PO TABS: 10.0000 mg | ORAL_TABLET | Freq: Every day | ORAL | Status: DC

## 2015-01-24 MED ORDER — METOPROLOL SUCCINATE ER 25 MG PO TB24: 25.0000 mg | ORAL_TABLET | Freq: Every day | ORAL | Status: DC

## 2015-01-24 MED ORDER — DILTIAZEM HCL ER COATED BEADS 120 MG PO CP24
120.0000 mg | ORAL_CAPSULE | Freq: Every day | ORAL | Status: DC
  Filled 2015-01-24: qty 1

## 2015-01-24 MED ORDER — DILTIAZEM HCL ER COATED BEADS 120 MG PO CP24: 120.0000 mg | ORAL_CAPSULE | Freq: Every day | ORAL | Status: DC

## 2015-01-24 NOTE — Progress Notes (Signed)
Patient ID: Kaitlyn [name], female   DOB: Jun 16, 1950, 64 y.o.   MRN: [id number]  Advanced Heart Failure Rounding Note  PCP: No PCP Primary Cardiologist: Dr Shirlee Latch but is non-compliant.  Subjective:    Kaitlyn [name] presented to the ED on 9/7 for dyspnea in setting of medical non compliance.  Was found to be in afib RVR and admitted for further evaluation and treated.  Last admitted from 8/7 to 12/25/2014 for A/C systolic HF. She was sent home on digoxin 0.125mg , lasix  BID,  lisinopril , and Toprol XL . She diuresed 15 L and lost 25 lbs on 40 mg IV lasix BID. Discharge weight was 250 lbs.  She diuresed well last night with IV Lasix, weight is down.  Feels like breathing is "100%" better.  HR controlled in 90s on current regimen.   Objective:   Weight Range: 256 lb 8 oz (116.348 kg) Body mass index is 35.79 kg/(m^2).   Vital Signs:   Temp:  [97.4 F (36.3 C)-97.8 F (36.6 C)] 97.5 F (36.4 C) (09/09 0700) Pulse Rate:  [84-115] 95 (09/09 0700) Resp:  [19-24] 19 (09/09 0700) BP: (96-125)/(62-80) 124/75 mmHg (09/09 0700) SpO2:  [94 %-97 %] 95 % (09/09 0700) Weight:  [256 lb 8 oz (116.348 kg)] 256 lb 8 oz (116.348 kg) (09/09 0700) Last BM Date: 01/22/15  Weight change: Filed Weights   01/22/15 2136 01/23/15 0522 01/24/15 0700  Weight: 263 lb 12.8 oz (119.659 kg) 263 lb 6.4 oz (119.477 kg) 256 lb 8 oz (116.348 kg)    Intake/Output:   Intake/Output Summary (Last 24 hours) at 01/24/15 0813 Last data filed at 01/24/15 0600  Gross per 24 hour  Intake   1080 ml  Output   7500 ml  Net  -6420 ml     Physical Exam: General: Pleasant, NAD. Psych: Normal affect. HEENT: Normal Neck: Supple without bruits. JVD 10 cm Heart: Irregularly irregular rhythm.  no s3, s4, or murmurs appreciated. Lungs: Crackles bibasilar Abdomen: Soft, non-tender, non-distended, BS + x 4.  Extremities: No clubbing, cyanosis or edema. DP/PT/Radials 2+ and equal bilaterally. 2+ edema to  mid shin bilaterally. Neuro: Alert and oriented X 3. Moves all extremities spontaneously.  Telemetry:  Afib 90s  Labs: CBC  Recent Labs  01/23/15 0434 01/24/15 0555  WBC 8.3 8.3  HGB 11.8* 11.8*  HCT 36.2 36.8  MCV 101.4* 101.4*  PLT 205 215   Basic Metabolic Panel  Recent Labs  01/23/15 0434 01/24/15 0555  NA 138 137  K 3.8 3.6  CL 106 102  CO2 26 28  GLUCOSE 186* 192*  BUN 15 13  CALCIUM 8.9 8.8*   Liver Function Tests  Recent Labs  01/22/15 0813  AST 16  ALT 15  ALKPHOS 49  BILITOT 1.4*  PROT 6.4*  ALBUMIN 3.5   No results for input(s): LIPASE, AMYLASE in the last 72 hours. Cardiac Enzymes  Recent Labs  01/22/15 0813 01/22/15 1350 01/22/15 2344  TROPONINI <0.03 <0.03 <0.03    BNP: BNP (last 3 results)  Recent Labs  12/22/14 0831 01/18/15 0115 01/22/15 0212  BNP 1240.2* 943.2* 872.8*    ProBNP (last 3 results)  Recent Labs  03/21/14 2126 03/22/14 1312 04/27/14 0128  PROBNP 2946.0* 3181.0* 5351.0*     D-Dimer No results for input(s): DDIMER in the last 72 hours. Hemoglobin A1C  Recent Labs  01/22/15 0813  HGBA1C 9.6*   Fasting Lipid Panel No results for input(s): CHOL, HDL, LDLCALC, TRIG, CHOLHDL, LDLDIRECT in  the last 72 hours. Thyroid Function Tests  Recent Labs  01/22/15 0813  TSH 2.336    Other results:     Imaging/Studies:  No results found.  Latest Echo  Latest Cath   Medications:     Scheduled Medications: . aspirin  325 mg Oral Daily  . digoxin  0.125 mg Oral Daily  . diltiazem  120 mg Oral Daily  . enoxaparin (LOVENOX) injection  55 mg Subcutaneous Q24H  . furosemide  40 mg Oral BID  . insulin aspart  0-5 Units Subcutaneous QHS  . insulin aspart  0-9 Units Subcutaneous TID WC  . lisinopril  10 mg Oral Daily  . metoprolol succinate  25 mg Oral Daily  . potassium chloride  20 mEq Oral BID  . sodium chloride  3 mL Intravenous Q12H    Infusions:    PRN Medications: sodium chloride,  acetaminophen **OR** acetaminophen, diphenhydrAMINE, sodium chloride   Assessment/Plan   Kaitlyn [name] is a 64 y.o. female with history of chronic afib, systolic HF, and DM who presented to with Afib-RVR and volume overload in the setting of medical non compliance. HF team was consulted to assist in managing her fluid status. She has had 12 ED visits and 8 admissions in the past 6 months. She has a long history of medical non compliance and will sometimes sign out of admissions AMA.  1. Chronic atrial fibrillation: Rate control improved on Toprol XL + digoxin + low dose diltiazem.  She is NOT a good anticoagulation candidate due to noncompliance and inability to afford novel oral anticoags at this time. - Continue ASA 325 mg daily - I have been very reticent to give her diltiazem with EF 20-25% but she is insistent.  Has tolerated in hospital.  Would let her go home on diltiazem CD 120 mg daily.  2. Acute on chronic systolic HF (EF 20-25% 10/10/14 echo): Volume status much improved this morning, looks near-euvolemic.  - Agrees to increase Lasix to 40 mg po bid at home.  - Continue digoxin, Toprol XL, and lisinopril at current doses.  - She does not need repeat echo - She is NOT a candidate for advanced procedures with recurrent noncompliance and refusal of medical procedures. 3. DM: Poorly controlled in setting of non-compliance and lack of resources. - Per IM 4. Noncompliance: Marked noncompliance, will not be seen in the outpatient setting and takes medications as she thinks they should be taken.  High risk for future admission. Some of this is due to lack of financial resources but some of this is not.  5. Disposition: She wants to go home today, I think this is reasonable.  She has appt in CHF clinic on 9/28, will need help with transportation.  Will need to see if we can get her a supply of meds to go home with.  Home cardiac meds: Lasix 40 mg po bid, KCl 20 mEq bid, digoxin 0.125 daily,  diltiazem CD 120 mg daily, Toprol XL 25 mg daily, lisinopril 10 mg daily  Length of Stay: 2  [name]  01/24/2015, 8:13 AM

## 2015-01-24 NOTE — Progress Notes (Signed)
Tech offered Pt a bath. Pt refused and stated she would handled bath at home.

## 2015-01-24 NOTE — Progress Notes (Signed)
Pt got discharged, discharge instructions provided and patient showed understanding towards it, IV taken out and Telemonitor  DC, pt is following up with PCP.

## 2015-01-24 NOTE — Discharge Summary (Signed)
Physician Discharge Summary  Kaitlyn [name] PHX:[id number] DOB: January 13, 1951 DOA: 01/21/2015  PCP: No PCP Per Patient  Admit date: 01/21/2015 Discharge date: 01/24/2015  Time spent: 45 minutes  Recommendations for Outpatient Follow-up:  Patient will be discharged to home.  Patient will need to follow up with primary care provider within one week of discharge.  Patient will also need to follow up with Cardiology on 9/28.  Patient should continue medications as prescribed.  Patient should follow a heart healthy/carb modified diet with 1500 ml fluid restriction per day.   Patient is at HIGH RISK FOR READMISSION.  Discharge Diagnoses:  Active Problems:   H/O noncompliance with medical treatment, presenting hazards to health   Atrial fibrillation with RVR   Essential hypertension   Acute on chronic systolic heart failure   Diabetes type 2, uncontrolled   Shortness of breath   Atrial fibrillation with rapid ventricular response   Noncompliance with medication regimen   Congestive heart disease  Discharge Condition: stable  Diet recommendation: heart healthy/carb modified diet with 1500 ml fluid restriction per day.  Filed Weights   01/22/15 2136 01/23/15 0522 01/24/15 0700  Weight: 119.659 kg (263 lb 12.8 oz) 119.477 kg (263 lb 6.4 oz) 116.348 kg (256 lb 8 oz)    History of present illness:  on 01/22/2015 by Dr. Pearson Grippe 64 yo female with hx of chronic afib, CHF (EF 15-20%), Dm2, apparrently c/o dyspnea for the past 3-4 days. Pt denies weight gain, cp, orthopnea, pnd. Pt has recently ran out of medication. Out of lisinopril x 43month, and out of digoxin for 1 week. Pt came to ED and was found to be in Afib with RVR. Pt was started on cardizem drip and feels much better. Pt will be admitted for afib with RVR.  Hospital Course:  Dyspnea secondary to A. fib with RVR -Patient feels her shortness of breath has resolved -Cardiology consulted and appreciated -Patient transitioned off of  Cardizem drip and oral Cardizem -Likely secondary to noncompliance of medications -Not a candidate for NOAC due to noncompliance -Patient will be discharged with Lasix 40 mg po bid, KCl 20 mEq bid, digoxin 0.125 daily, diltiazem CD 120 mg daily, Toprol XL 25 mg daily, lisinopril 10 mg daily -She will need to follow up with the CHF clinic on 9/28  Acute on Chronic systolic heart failureCardiomyopathy -Echocardiogram 10/10/2014 shows an EF of 20-25%, diffuse hypokinesis -Monitor intake and output, daily weights -Continue lisinopril, aspirin, Lasix, metoprolol -UO over past 24 hours: 3175cc  Diabetes mellitus, type II -Glipizide held- restart upon discharge, continue insulin sliding scale CBG monitoring -HbA1c 9.6  Chronic macrocytic Anemia -hemoglobin appears to be stable, currently 11.8 -continue to monitor CBC  Hyponatremia  -Resolved  Thyroid nodule -Patient refused thyroid ultrasound -TSH 2.336  Procedures  None  Consults  Cardiology  Discharge Exam: Filed Vitals:   01/24/15 0700  BP: 124/75  Pulse: 95  Temp: 97.5 F (36.4 C)  Resp: 19   Exam  General: Well developed, well nourished, NAD  HEENT: NCAT, mucous membranes moist.   Neck: Supple, + JVD, no masses  Cardiovascular: S1 S2 auscultated, IRR, no murmurs appreciated  Respiratory: Bibasilar crackles, no wheezing  Abdomen: Soft, obese, nontender, nondistended, + bowel sounds  Extremities: warm dry without cyanosis clubbing. 2+ edema LE B/L  Neuro: AAOx3, nonfocal  Psych: Normal affect and demeanor, pleasant  Discharge Instructions      Discharge Instructions    Discharge instructions    Complete by:  As directed  Patient will be discharged to home.  Patient will need to follow up with primary care provider within one week of discharge.  Patient will also need to follow up with Cardiology on 9/28.  Patient should continue medications as prescribed.  Patient should follow a heart healthy/carb  modified diet with 1500 ml fluid restriction per day.            Medication List    TAKE these medications        aspirin 325 MG tablet  Take 1 tablet (325 mg total) by mouth daily.     digoxin 0.125 MG tablet  Commonly known as:  LANOXIN  Take 1 tablet (0.125 mg total) by mouth daily.     diltiazem 120 MG 24 hr capsule  Commonly known as:  CARDIZEM CD  Take 1 capsule (120 mg total) by mouth daily.     diphenhydrAMINE 25 MG tablet  Commonly known as:  BENADRYL  Take 1 tablet (25 mg total) by mouth every 8 (eight) hours as needed for itching or allergies.     furosemide 40 MG tablet  Commonly known as:  LASIX  Take 1 tablet (40 mg total) by mouth 2 (two) times daily.     glipiZIDE 5 MG tablet  Commonly known as:  GLUCOTROL  Take 1 tablet (5 mg total) by mouth daily before breakfast.     lisinopril 10 MG tablet  Commonly known as:  PRINIVIL,ZESTRIL  Take 1 tablet (10 mg total) by mouth daily.     metoprolol succinate 25 MG 24 hr tablet  Commonly known as:  TOPROL-XL  Take 1 tablet (25 mg total) by mouth daily.     potassium chloride SA 20 MEQ tablet  Commonly known as:  K-DUR,KLOR-CON  Take 1 tablet (20 mEq total) by mouth 2 (two) times daily.       Allergies  Allergen Reactions  . Caffeine Other (See Comments)    Seizure  . Penicillins Other (See Comments)    Unknown childhood allergy  . Pineapple Itching and Swelling   Follow-up Information    Follow up with [name], MD On 02/12/2015.   Specialty:  Cardiology   Why:  at 140 pm.  Please bring all of your medications to your appointment.  The code for parking is 0090   Contact information:   [address] [place] [address] [phone]        The results of significant diagnostics from this hospitalization (including imaging, microbiology, ancillary and laboratory) are listed below for reference.    Significant Diagnostic Studies: Dg Chest 2 View  01/21/2015   CLINICAL  DATA:  Chest pain and shortness of breath for 3 days  EXAM: CHEST  2 VIEW  COMPARISON:  01/18/15  FINDINGS: Stable moderate cardiac silhouette enlargement. Vascular pattern normal. Lungs clear. Trace right pleural effusion.  IMPRESSION: Trace right pleural effusion, otherwise no change from prior study.   Electronically Signed   By: Esperanza Heir M.D.   On: 01/21/2015 22:18   Dg Chest 2 View  01/18/2015   CLINICAL DATA:  Dyspnea for 2 days.  EXAM: CHEST  2 VIEW  COMPARISON:  12/22/2014  FINDINGS: There is unchanged cardiomegaly. There is mild hyperinflation. Minimal linear basilar opacities are present on the right, scarring versus atelectasis. There is no confluent alveolar opacity. There is no effusion. Pulmonary vasculature is normal.  IMPRESSION: Unchanged cardiomegaly. Minimal linear scarring or atelectasis in the right base.   Electronically Signed  By: Ellery Plunk M.D.   On: 01/18/2015 01:41    Microbiology: Recent Results (from the past 240 hour(s))  MRSA PCR Screening     Status: None   Collection Time: 01/22/15  6:38 AM  Result Value Ref Range Status   MRSA by PCR NEGATIVE NEGATIVE Final    Comment:        The GeneXpert MRSA Assay (FDA approved for NASAL specimens only), is one component of a comprehensive MRSA colonization surveillance program. It is not intended to diagnose MRSA infection nor to guide or monitor treatment for MRSA infections.      Labs: Basic Metabolic Panel:  Recent Labs Lab 01/18/15 0115 01/21/15 2049 01/22/15 0813 01/23/15 0434 01/24/15 0555  NA 133* 134* 137 138 137  K 4.0 4.0 3.7 3.8 3.6  CL 104 106 105 106 102  CO2 22 19* 25 26 28   GLUCOSE 259* 258* 228* 186* 192*  BUN 14 15 13 15 13   CREATININE 0.91 0.97 0.99 1.09* 1.03*  CALCIUM 8.4* 8.4* 8.9 8.9 8.8*   Liver Function Tests:  Recent Labs Lab 01/22/15 0813  AST 16  ALT 15  ALKPHOS 49  BILITOT 1.4*  PROT 6.4*  ALBUMIN 3.5   No results for input(s): LIPASE, AMYLASE in the  last 168 hours. No results for input(s): AMMONIA in the last 168 hours. CBC:  Recent Labs Lab 01/18/15 0115 01/21/15 2049 01/22/15 0813 01/23/15 0434 01/24/15 0555  WBC 13.5* 10.1 8.6 8.3 8.3  HGB 11.5* 11.4* 12.0 11.8* 11.8*  HCT 35.8* 35.7* 36.1 36.2 36.8  MCV 101.1* 100.8* 101.1* 101.4* 101.4*  PLT 193 208 197 205 215   Cardiac Enzymes:  Recent Labs Lab 01/22/15 0813 01/22/15 1350 01/22/15 2344  TROPONINI <0.03 <0.03 <0.03   BNP: BNP (last 3 results)  Recent Labs  12/22/14 0831 01/18/15 0115 01/22/15 0212  BNP 1240.2* 943.2* 872.8*    ProBNP (last 3 results)  Recent Labs  03/21/14 2126 03/22/14 1312 04/27/14 0128  PROBNP 2946.0* 3181.0* 5351.0*    CBG:  Recent Labs Lab 01/23/15 0640 01/23/15 1109 01/23/15 1716 01/23/15 2102 01/24/15 0655  GLUCAP 193* 176* 179* 158* 166*       Signed:  Teryn Boerema  [facility] 01/24/2015, 8:44 AM

## 2015-01-24 NOTE — Progress Notes (Signed)
Patient is stable for d/c today to return home.  CSW met with patient to arrange EMS transport as she states she does not have anyone to pick her up nor can she talk from the bus stop.  Patient has Medicaid but has refused to [facility] and other services.She is weak and unsteady to walk- thus a cab is not a safe transport for patient.  EMs set for 2:30 pm pick. Nursing notified of above.  CSW signing off.  Lorie Phenix. Pauline Good, [place]

## 2015-02-12 ENCOUNTER — Emergency Department (HOSPITAL_COMMUNITY): Payer: Medicaid - Out of State

## 2015-02-12 ENCOUNTER — Encounter (HOSPITAL_COMMUNITY): Payer: Self-pay

## 2015-02-12 ENCOUNTER — Inpatient Hospital Stay (HOSPITAL_COMMUNITY)
Admission: EM | Admit: 2015-02-12 | Discharge: 2015-02-16 | DRG: 292 | Disposition: A | Discharge status: Home / Self Care | Payer: Medicaid - Out of State | Attending: Cardiology | Admitting: Cardiology

## 2015-02-12 ENCOUNTER — Inpatient Hospital Stay (HOSPITAL_COMMUNITY): Payer: Self-pay

## 2015-02-12 DIAGNOSIS — I351 Nonrheumatic aortic (valve) insufficiency: Secondary | ICD-10-CM | POA: Diagnosis present

## 2015-02-12 DIAGNOSIS — Z9114 Patient's other noncompliance with medication regimen: Secondary | ICD-10-CM

## 2015-02-12 DIAGNOSIS — I481 Persistent atrial fibrillation: Secondary | ICD-10-CM | POA: Diagnosis not present

## 2015-02-12 DIAGNOSIS — I482 Chronic atrial fibrillation: Secondary | ICD-10-CM | POA: Diagnosis not present

## 2015-02-12 DIAGNOSIS — Z79899 Other long term (current) drug therapy: Secondary | ICD-10-CM

## 2015-02-12 DIAGNOSIS — M1990 Unspecified osteoarthritis, unspecified site: Secondary | ICD-10-CM | POA: Diagnosis present

## 2015-02-12 DIAGNOSIS — I110 Hypertensive heart disease with heart failure: Principal | ICD-10-CM | POA: Diagnosis present

## 2015-02-12 DIAGNOSIS — I509 Heart failure, unspecified: Secondary | ICD-10-CM

## 2015-02-12 DIAGNOSIS — Z7984 Long term (current) use of oral hypoglycemic drugs: Secondary | ICD-10-CM

## 2015-02-12 DIAGNOSIS — I4891 Unspecified atrial fibrillation: Secondary | ICD-10-CM

## 2015-02-12 DIAGNOSIS — E119 Type 2 diabetes mellitus without complications: Secondary | ICD-10-CM | POA: Diagnosis present

## 2015-02-12 DIAGNOSIS — Z7982 Long term (current) use of aspirin: Secondary | ICD-10-CM

## 2015-02-12 DIAGNOSIS — Z9119 Patient's noncompliance with other medical treatment and regimen: Secondary | ICD-10-CM

## 2015-02-12 DIAGNOSIS — I4892 Unspecified atrial flutter: Secondary | ICD-10-CM | POA: Diagnosis present

## 2015-02-12 DIAGNOSIS — I5023 Acute on chronic systolic (congestive) heart failure: Secondary | ICD-10-CM | POA: Diagnosis present

## 2015-02-12 DIAGNOSIS — Z91199 Patient's noncompliance with other medical treatment and regimen due to unspecified reason: Secondary | ICD-10-CM

## 2015-02-12 LAB — CBC
HCT: 36.7 % (ref 36.0–46.0)
Hemoglobin: 11.9 g/dL — ABNORMAL LOW (ref 12.0–15.0)
MCH: 32.8 pg (ref 26.0–34.0)
MCHC: 32.4 g/dL (ref 30.0–36.0)
MCV: 101.1 fL — ABNORMAL HIGH (ref 78.0–100.0)
Platelets: 180 10*3/uL (ref 150–400)
RBC: 3.63 MIL/uL — ABNORMAL LOW (ref 3.87–5.11)
RDW: 15.3 % (ref 11.5–15.5)
WBC: 11.1 10*3/uL — ABNORMAL HIGH (ref 4.0–10.5)

## 2015-02-12 LAB — BASIC METABOLIC PANEL
Anion gap: 10 (ref 5–15)
BUN: 16 mg/dL (ref 6–20)
CO2: 18 mmol/L — ABNORMAL LOW (ref 22–32)
Calcium: 8.4 mg/dL — ABNORMAL LOW (ref 8.9–10.3)
Chloride: 106 mmol/L (ref 101–111)
Creatinine, Ser: 1.09 mg/dL — ABNORMAL HIGH (ref 0.44–1.00)
GFR calc Af Amer: >60 mL/min (ref >60)
GFR calc non Af Amer: 52 mL/min — ABNORMAL LOW (ref >60)
Glucose, Bld: 214 mg/dL — ABNORMAL HIGH (ref 65–99)
Potassium: 3.9 mmol/L (ref 3.5–5.1)
Sodium: 134 mmol/L — ABNORMAL LOW (ref 135–145)

## 2015-02-12 LAB — I-STAT TROPONIN, ED: Troponin i, poc: 0 ng/mL (ref 0.00–0.08)

## 2015-02-12 LAB — BRAIN NATRIURETIC PEPTIDE: B Natriuretic Peptide: 1107 pg/mL — ABNORMAL HIGH (ref 0.0–100.0)

## 2015-02-12 LAB — GLUCOSE, CAPILLARY
Glucose-Capillary: 162 mg/dL — ABNORMAL HIGH (ref 65–99)
Glucose-Capillary: 210 mg/dL — ABNORMAL HIGH (ref 65–99)

## 2015-02-12 MED ORDER — POTASSIUM CHLORIDE CRYS ER 20 MEQ PO TBCR
20.0000 meq | EXTENDED_RELEASE_TABLET | Freq: Once | ORAL | Status: AC
  Administered 2015-02-12: 20 meq via ORAL
  Filled 2015-02-12: qty 1

## 2015-02-12 MED ORDER — FUROSEMIDE 10 MG/ML IJ SOLN
80.0000 mg | Freq: Two times a day (BID) | INTRAMUSCULAR | Status: DC
  Administered 2015-02-13 – 2015-02-14 (×2): 40 mg via INTRAVENOUS
  Filled 2015-02-12 (×2): qty 8

## 2015-02-12 MED ORDER — POTASSIUM CHLORIDE CRYS ER 20 MEQ PO TBCR
20.0000 meq | EXTENDED_RELEASE_TABLET | Freq: Two times a day (BID) | ORAL | Status: DC
  Administered 2015-02-12: 20 meq via ORAL
  Filled 2015-02-12: qty 1

## 2015-02-12 MED ORDER — METOPROLOL SUCCINATE ER 25 MG PO TB24
25.0000 mg | ORAL_TABLET | Freq: Every day | ORAL | Status: DC
  Administered 2015-02-12 – 2015-02-13 (×2): 25 mg via ORAL
  Filled 2015-02-12 (×2): qty 1

## 2015-02-12 MED ORDER — SODIUM CHLORIDE 0.9 % IV SOLN
250.0000 mL | INTRAVENOUS | Status: DC | PRN
Start: 2015-02-12 — End: 2015-02-16

## 2015-02-12 MED ORDER — SODIUM CHLORIDE 0.9 % IJ SOLN
3.0000 mL | INTRAMUSCULAR | Status: DC | PRN
Start: 2015-02-12 — End: 2015-02-16

## 2015-02-12 MED ORDER — ENOXAPARIN SODIUM 40 MG/0.4ML ~~LOC~~ SOLN
40.0000 mg | SUBCUTANEOUS | Status: DC
  Administered 2015-02-12 – 2015-02-15 (×4): 40 mg via SUBCUTANEOUS
  Filled 2015-02-12 (×4): qty 0.4

## 2015-02-12 MED ORDER — FUROSEMIDE 10 MG/ML IJ SOLN
40.0000 mg | Freq: Once | INTRAMUSCULAR | Status: AC
  Administered 2015-02-12: 40 mg via INTRAVENOUS
  Filled 2015-02-12: qty 4

## 2015-02-12 MED ORDER — DIPHENHYDRAMINE HCL 25 MG PO TABS
25.0000 mg | ORAL_TABLET | Freq: Three times a day (TID) | ORAL | Status: DC | PRN
  Filled 2015-02-12: qty 1

## 2015-02-12 MED ORDER — ASPIRIN 325 MG PO TABS
325.0000 mg | ORAL_TABLET | Freq: Every day | ORAL | Status: DC
  Administered 2015-02-13: 325 mg via ORAL
  Filled 2015-02-12 (×2): qty 1

## 2015-02-12 MED ORDER — LISINOPRIL 10 MG PO TABS
10.0000 mg | ORAL_TABLET | Freq: Every day | ORAL | Status: DC
  Administered 2015-02-13 – 2015-02-16 (×4): 10 mg via ORAL
  Filled 2015-02-12 (×4): qty 1

## 2015-02-12 MED ORDER — ONDANSETRON HCL 4 MG/2ML IJ SOLN: 4.0000 mg | Freq: Four times a day (QID) | INTRAMUSCULAR | Status: DC | PRN

## 2015-02-12 MED ORDER — DEXTROSE 5 % IV SOLN: 5.0000 mg/h | Freq: Once | INTRAVENOUS | Status: DC

## 2015-02-12 MED ORDER — GLIPIZIDE 5 MG PO TABS
5.0000 mg | ORAL_TABLET | Freq: Every day | ORAL | Status: DC
  Administered 2015-02-13: 5 mg via ORAL
  Filled 2015-02-12 (×2): qty 1

## 2015-02-12 MED ORDER — DIGOXIN 125 MCG PO TABS
0.1250 mg | ORAL_TABLET | Freq: Every day | ORAL | Status: DC
Start: 2015-02-12 — End: 2015-02-16
  Administered 2015-02-13 – 2015-02-16 (×4): 0.125 mg via ORAL
  Filled 2015-02-12 (×4): qty 1

## 2015-02-12 MED ORDER — ACETAMINOPHEN 325 MG PO TABS
650.0000 mg | ORAL_TABLET | ORAL | Status: DC | PRN
Start: 2015-02-12 — End: 2015-02-16

## 2015-02-12 MED ORDER — DILTIAZEM HCL 30 MG PO TABS
30.0000 mg | ORAL_TABLET | Freq: Once | ORAL | Status: AC
  Administered 2015-02-12: 30 mg via ORAL
  Filled 2015-02-12 (×2): qty 1

## 2015-02-12 MED ORDER — DILTIAZEM HCL ER COATED BEADS 120 MG PO CP24
120.0000 mg | ORAL_CAPSULE | Freq: Every day | ORAL | Status: DC
Start: 2015-02-12 — End: 2015-02-15
  Administered 2015-02-13 – 2015-02-15 (×3): 120 mg via ORAL
  Filled 2015-02-12 (×3): qty 1

## 2015-02-12 MED ORDER — SODIUM CHLORIDE 0.9 % IJ SOLN
3.0000 mL | Freq: Two times a day (BID) | INTRAMUSCULAR | Status: DC
  Administered 2015-02-12 – 2015-02-15 (×7): 3 mL via INTRAVENOUS

## 2015-02-12 MED ORDER — DILTIAZEM HCL 100 MG IV SOLR
5.0000 mg/h | INTRAVENOUS | Status: AC
  Administered 2015-02-12 – 2015-02-13 (×2): 5 mg/h via INTRAVENOUS
  Filled 2015-02-12 (×2): qty 100

## 2015-02-12 NOTE — ED Notes (Signed)
Patient transported to X-ray 
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
## 2015-02-12 NOTE — ED Notes (Signed)
Pt returned from bathroom. Monitored by pulse ox, bp cuff, and 5-lead. 

## 2015-02-12 NOTE — ED Notes (Signed)
Report attempted 

## 2015-02-12 NOTE — ED Notes (Signed)
Per [facility], pt from home for afib that has been going on for the past 6-10 hours. Pt states she felt like her heart has been racing. Denies any pain but states she feels SOB. Has been out of her medication for the a month now. Lives by herself. Has a 20g to the left forearm. afib on monitor. Rate of 140 by EMS. 91 % on RA and placed on 2 liters of oxygen with saturation up to 97%
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
## 2015-02-12 NOTE — ED Notes (Signed)
Ordered heart healthy meal tray for pt. 

## 2015-02-12 NOTE — Progress Notes (Signed)
Kaitlyn [name] is known very well by the AHF Team.  Kaitlyn Beech LCSW and myself visited patient to assess reasons for this ED visit and admission as she has had 13 visits in 6 months.  She says that she "ran out of money this month", was unable to get her medications and could not "make it" until she received her check today-so she called EMS.  We have in the past arranged taxi transportation to and from our outpatient clinic for follow-up and Kaitlyn. [name] has refused to come.  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  We discussed possible solutions for her and she has agreed that in the future she will call when she is almost out of medications and/or having financial difficulties which would cause her to run out of medications.  She insists that she will not accept [facility] or Paramedicine and will not be "pushed" to come to appts.  We will provide her medications at the time of discharge and have hopes that she will keep [place] informed going forward.

## 2015-02-12 NOTE — ED Notes (Signed)
Pt assisted to [facility] -- stand by assist- transferred independently with no difficulty,

## 2015-02-13 ENCOUNTER — Encounter (HOSPITAL_COMMUNITY): Payer: Self-pay | Admitting: General Practice

## 2015-02-13 DIAGNOSIS — I5023 Acute on chronic systolic (congestive) heart failure: Secondary | ICD-10-CM | POA: Diagnosis not present

## 2015-02-13 DIAGNOSIS — I482 Chronic atrial fibrillation: Secondary | ICD-10-CM | POA: Diagnosis not present

## 2015-02-13 LAB — BASIC METABOLIC PANEL
Anion gap: 9 (ref 5–15)
Anion gap: 7 (ref 5–15)
BUN: 18 mg/dL (ref 6–20)
BUN: 17 mg/dL (ref 6–20)
CO2: 24 mmol/L (ref 22–32)
CO2: 26 mmol/L (ref 22–32)
Calcium: 8.8 mg/dL — ABNORMAL LOW (ref 8.9–10.3)
Calcium: 8.6 mg/dL — ABNORMAL LOW (ref 8.9–10.3)
Chloride: 105 mmol/L (ref 101–111)
Chloride: 104 mmol/L (ref 101–111)
Creatinine, Ser: 0.98 mg/dL (ref 0.44–1.00)
Creatinine, Ser: 1.09 mg/dL — ABNORMAL HIGH (ref 0.44–1.00)
GFR calc Af Amer: >60 mL/min (ref >60)
GFR calc Af Amer: >60 mL/min (ref >60)
GFR calc non Af Amer: 60 mL/min — ABNORMAL LOW (ref >60)
GFR calc non Af Amer: 52 mL/min — ABNORMAL LOW (ref >60)
Glucose, Bld: 232 mg/dL — ABNORMAL HIGH (ref 65–99)
Glucose, Bld: 103 mg/dL — ABNORMAL HIGH (ref 65–99)
Potassium: 3.8 mmol/L (ref 3.5–5.1)
Potassium: 3.9 mmol/L (ref 3.5–5.1)
Sodium: 138 mmol/L (ref 135–145)
Sodium: 137 mmol/L (ref 135–145)

## 2015-02-13 LAB — GLUCOSE, CAPILLARY
Glucose-Capillary: 198 mg/dL — ABNORMAL HIGH (ref 65–99)
Glucose-Capillary: 171 mg/dL — ABNORMAL HIGH (ref 65–99)
Glucose-Capillary: 71 mg/dL (ref 65–99)
Glucose-Capillary: 124 mg/dL — ABNORMAL HIGH (ref 65–99)

## 2015-02-13 MED ORDER — METOPROLOL SUCCINATE ER 25 MG PO TB24
25.0000 mg | ORAL_TABLET | Freq: Two times a day (BID) | ORAL | Status: DC
  Administered 2015-02-13 – 2015-02-14 (×2): 25 mg via ORAL
  Filled 2015-02-13 (×2): qty 1

## 2015-02-13 MED ORDER — POTASSIUM CHLORIDE CRYS ER 20 MEQ PO TBCR
40.0000 meq | EXTENDED_RELEASE_TABLET | Freq: Two times a day (BID) | ORAL | Status: DC
  Administered 2015-02-13 – 2015-02-14 (×3): 40 meq via ORAL
  Filled 2015-02-13 (×3): qty 2

## 2015-02-13 NOTE — Progress Notes (Signed)
Pt refuses full dose of ordered IV Lasix, states she will only take 40mg , will continue to monitor closely.  Raymon Mutton RN

## 2015-02-13 NOTE — Progress Notes (Signed)
Pt refuses IV lasix tonight, states she is not able to take twice a day, explained that she needed for her fluid overload, states she understands, but still refuses, will continue to monitor, Raymon Mutton RN

## 2015-02-13 NOTE — Progress Notes (Signed)
UR Completed Olamae Ferrara Graves-Bigelow, RN,BSN [phone]  

## 2015-02-13 NOTE — Care Management Note (Addendum)
Case Management Note  Patient Details  Name: Kaitlyn [name] Surgeon MRN: [id number] Date of Birth: September 01, 1950  Subjective/Objective:  Pt admitted for Afib and CHF. Initiated on Cardizem gtt and IV lasix. Pt is from home alone. Noncompliant with medications and hsopital f/u appointments.                 Action/Plan: CM has spoken to pt several times in ref to hospital f/u and medication management. Pt without transportation and CSW will assist with needs once stable for d/c. HF Clinic Liaison will f/u with pt in regards to medications. No further needs from CM at this time.   Expected Discharge Date:                  Expected Discharge Plan:  Home/Self Care  In-House Referral:  Clinical Social Work Education officer, environmental)  Discharge planning Services  CM Consult  Post Acute Care Choice:  NA Choice offered to:  NA  DME Arranged:  N/A DME Agency:  NA  HH Arranged:  NA HH Agency:  NA  Status of Service:  Completed, signed off  Medicare Important Message Given:    Date Medicare IM Given:    Medicare IM give by:    Date Additional Medicare IM Given:    Additional Medicare Important Message give by:     If discussed at Long Length of Stay Meetings, dates discussed:    Additional Comments: 1125 02-14-15 Tomi Bamberger, RN,BSN [phone] CM did speak with HF Navigator Driscilla Moats this am in regards to medications for pt. Per Dorene Grebe with HF will get medications via the HF Fund. CM did speak with Daphne in regards to possible 90 day supply instead of 30 day supply. Unsure if Fund can be allowed to do that much. Pt is set up with [facility]- if unable to do a 90 day supply refills need to be sent to this delivery pharmacy. No further needs from CM at this time.   Gala Lewandowsky, RN 02/13/2015, 11:59 AM

## 2015-02-13 NOTE — Progress Notes (Signed)
Advanced Heart Failure Rounding Note  Primary Physician: Dr Madilyn Hook Primary HF Cardiologist: Dr Shirlee Latch  Reason for Admission: Heart Failure Volume Overload  Subjective:    Feeling much better today.  Refused po cardizem last night because she did not want IV and po.  Now understands we would like to load her with po prior to d/c of the cardizem drip. Willing to stay 1-2 more days for diuresis.   Out 1.4 L and down 6 lbs. Cr 0.98  Objective:   Weight Range: 273 lb 9.6 oz (124.104 kg) Body mass index is 38.18 kg/(m^2).   Vital Signs:   Temp:  [97.6 F (36.4 C)-97.9 F (36.6 C)] 97.9 F (36.6 C) (09/29 0421) Pulse Rate:  [87-108] 87 (09/29 0421) Resp:  [15-29] 20 (09/29 0421) BP: (99-135)/(73-96) 124/79 mmHg (09/29 0421) SpO2:  [94 %-100 %] 95 % (09/29 0421) Weight:  [273 lb 9.6 oz (124.104 kg)-279 lb 15.8 oz (127 kg)] 273 lb 9.6 oz (124.104 kg) (09/29 0421) Last BM Date: 02/12/15  Weight change: Filed Weights   02/12/15 0734 02/12/15 1539 02/13/15 0421  Weight: 278 lb 14.4 oz (126.508 kg) 279 lb 15.8 oz (127 kg) 273 lb 9.6 oz (124.104 kg)    Intake/Output:   Intake/Output Summary (Last 24 hours) at 02/13/15 0913 Last data filed at 02/12/15 2112  Gross per 24 hour  Intake    160 ml  Output   1600 ml  Net  -1440 ml     Physical Exam: General: Well appearing. No resp difficulty HEENT: normal Neck: supple. JVP 12-13 cm. Carotids 2+ bilat; no bruits. No lymphadenopathy or thyromegaly. Cor: PMI nondisplaced. Mildly tachy, irregular rate & rhythm. No rubs, gallops or murmurs appreciated Lungs: CTA Abdomen: soft, NT, ND. No HSM. No bruits or masses. +BS Extremities: no cyanosis, clubbing, rash, R and L leg 2+ edema to thighs Neuro: alert & orientedx3, cranial nerves grossly intact. moves all 4 extremities w/o difficulty. Affect pleasant   Telemetry: Afib 90s  Labs: CBC  Recent Labs  02/12/15 0756  WBC 11.1*  HGB 11.9*  HCT 36.7  MCV 101.1*  PLT 180    Basic Metabolic Panel  Recent Labs  02/12/15 0756 02/13/15 0400  NA 134* 138  K 3.9 3.8  CL 106 105  CO2 18* 24  GLUCOSE 214* 232*  BUN 16 18  CALCIUM 8.4* 8.8*   Liver Function Tests No results for input(s): AST, ALT, ALKPHOS, BILITOT, PROT, ALBUMIN in the last 72 hours. No results for input(s): LIPASE, AMYLASE in the last 72 hours. Cardiac Enzymes No results for input(s): CKTOTAL, CKMB, CKMBINDEX, TROPONINI in the last 72 hours.  BNP: BNP (last 3 results)  Recent Labs  01/18/15 0115 01/22/15 0212 02/12/15 0757  BNP 943.2* 872.8* 1107.0*    ProBNP (last 3 results)  Recent Labs  03/21/14 2126 03/22/14 1312 04/27/14 0128  PROBNP 2946.0* 3181.0* 5351.0*     D-Dimer No results for input(s): DDIMER in the last 72 hours. Hemoglobin A1C No results for input(s): HGBA1C in the last 72 hours. Fasting Lipid Panel No results for input(s): CHOL, HDL, LDLCALC, TRIG, CHOLHDL, LDLDIRECT in the last 72 hours. Thyroid Function Tests No results for input(s): TSH, T4TOTAL, T3FREE, THYROIDAB in the last 72 hours.  Invalid input(s): FREET3  Other results:     Imaging/Studies:  Dg Chest 2 View  02/12/2015   CLINICAL DATA:  Shortness of breath. Atrial fibrillation. Symptoms beginning the night of 02/11/2015.  EXAM: CHEST  2 VIEW  COMPARISON:  PA and lateral chest 01/18/2015 and 01/21/2015.  FINDINGS: There is cardiomegaly without edema. The lungs are clear. No pneumothorax is identified. There may be a trace amount of pleural fluid on the right, decreased since the prior study.  IMPRESSION: Cardiomegaly without acute disease.   Electronically Signed   By: Drusilla Kanner M.D.   On: 02/12/2015 07:30     Latest Echo  Latest Cath   Medications:     Scheduled Medications: . aspirin  325 mg Oral Daily  . digoxin  0.125 mg Oral Daily  . diltiazem  120 mg Oral Daily  . enoxaparin (LOVENOX) injection  40 mg Subcutaneous Q24H  . furosemide  80 mg Intravenous BID  .  glipiZIDE  5 mg Oral QAC breakfast  . lisinopril  10 mg Oral Daily  . metoprolol succinate  25 mg Oral Daily  . potassium chloride SA  20 mEq Oral BID  . sodium chloride  3 mL Intravenous Q12H     Infusions: . diltiazem (CARDIZEM) infusion 5 mg/hr (02/13/15 0549)     PRN Medications:  sodium chloride, acetaminophen, diphenhydrAMINE, ondansetron (ZOFRAN) IV, sodium chloride   Assessment/Plan   Mrs Kaitlyn [name] has been re-admitted with recurrent atrial fibrillation/RVR and acute on chronic systolic CHF. She has had multiple admissions with the same. She remains noncompliant with meds at home and refuses to followup in the outpatient setting.. She has never been seen in follow up in the HF clinic.   1. Acute on chronic systolic CHF: EF 16-07%.  Volume status elevated in the setting on medication noncompliance.  - Continue 80 mg IV lasix twice a day. Increase K supp - Continue home dose of digoxin, Toprol XL, lisinopril. 2. Atrial fibrillation: Mild RVR initially.  - Rate has improved. D/C drip. Continue po diltiazem.  - She is not candidate for anticoagulation due to noncompliance.  3. Noncompliance:  - Marked noncompliance, will not be seen in the outpatient setting and takes medications as she thinks they should be taken. - High risk for future admission.  - Continues to refuse paramedicine and HH. States she will likely not follow up as an outpatient. - HF SW and Navigator saw 02/12/15. Will provide medications on discharge.  Dispo: Likely 1-2 more days of IV lasix.    Length of Stay: 1   Graciella Freer PA-C 02/13/2015, 9:13 AM  Advanced Heart Failure Team Pager [phone] (M-F; 7a - 4p)  Please contact [facility] for night-coverage after hours (4p -7a ) and weekends on [email or web address]  Patient seen with PA, agree with the above note.  Weight is down, she is diuresing reasonably with IV Lasix. Still volume overloaded, will continue.  Can stop IV diltiazem today,  got po diltiazem and will increase Toprol XL to 25 mg bid.  Will need to continue to work on ways to improve medication compliance at home.   [name] 02/13/2015 12:47 PM

## 2015-02-13 NOTE — Progress Notes (Signed)
Inpatient Diabetes Program Recommendations  AACE/ADA: New Consensus Statement on Inpatient Glycemic Control (2015)  Target Ranges:  Prepandial:   less than 140 mg/dL      Peak postprandial:   less than 180 mg/dL (1-2 hours)      Critically ill patients:  140 - 180 mg/dL   Review of Glycemic Control  Diabetes history: DM 2 Outpatient Diabetes medications: Glipizide 5 mg Daily Current orders for Inpatient glycemic control: Glipizide 5 mg Daily  Inpatient Diabetes Program Recommendations: Correction (SSI): While inpatient, please consider starting Novolog Sensitive TID + HS.  Thanks,  Christena Deem RN, MSN, [facility] Inpatient Diabetes Coordinator Team Pager [phone] (8a-5p)

## 2015-02-14 DIAGNOSIS — I5023 Acute on chronic systolic (congestive) heart failure: Secondary | ICD-10-CM | POA: Diagnosis not present

## 2015-02-14 DIAGNOSIS — I4891 Unspecified atrial fibrillation: Secondary | ICD-10-CM | POA: Diagnosis not present

## 2015-02-14 LAB — BASIC METABOLIC PANEL
Anion gap: 9 (ref 5–15)
BUN: 15 mg/dL (ref 6–20)
CO2: 24 mmol/L (ref 22–32)
Calcium: 8.7 mg/dL — ABNORMAL LOW (ref 8.9–10.3)
Chloride: 103 mmol/L (ref 101–111)
Creatinine, Ser: 1.05 mg/dL — ABNORMAL HIGH (ref 0.44–1.00)
GFR calc Af Amer: >60 mL/min (ref >60)
GFR calc non Af Amer: 55 mL/min — ABNORMAL LOW (ref >60)
Glucose, Bld: 176 mg/dL — ABNORMAL HIGH (ref 65–99)
Potassium: 4.4 mmol/L (ref 3.5–5.1)
Sodium: 136 mmol/L (ref 135–145)

## 2015-02-14 LAB — GLUCOSE, CAPILLARY
Glucose-Capillary: 173 mg/dL — ABNORMAL HIGH (ref 65–99)
Glucose-Capillary: 174 mg/dL — ABNORMAL HIGH (ref 65–99)
Glucose-Capillary: 166 mg/dL — ABNORMAL HIGH (ref 65–99)

## 2015-02-14 MED ORDER — FUROSEMIDE 10 MG/ML IJ SOLN
40.0000 mg | Freq: Two times a day (BID) | INTRAMUSCULAR | Status: DC
Start: 2015-02-14 — End: 2015-02-15
  Administered 2015-02-14 – 2015-02-15 (×2): 40 mg via INTRAVENOUS
  Filled 2015-02-14 (×2): qty 4

## 2015-02-14 MED ORDER — POTASSIUM CHLORIDE CRYS ER 20 MEQ PO TBCR
20.0000 meq | EXTENDED_RELEASE_TABLET | Freq: Two times a day (BID) | ORAL | Status: DC
  Administered 2015-02-14 – 2015-02-16 (×4): 20 meq via ORAL
  Filled 2015-02-14 (×4): qty 1

## 2015-02-14 MED ORDER — METOPROLOL SUCCINATE ER 25 MG PO TB24
37.5000 mg | ORAL_TABLET | Freq: Two times a day (BID) | ORAL | Status: DC
  Administered 2015-02-14 – 2015-02-15 (×2): 37.5 mg via ORAL
  Filled 2015-02-14 (×3): qty 2

## 2015-02-14 NOTE — Progress Notes (Signed)
Pt refuses to take full dose of lasix, 40 mg given per her request, refuses glipizide stating it made her feel like tired yesterday, refused ASA stating it is causing diarrhea, PA aware of pt med requests, heart rate in low 100s overnight increases to high 140s with activity, PA aware, will continue to monitor closely.  Raymon Mutton RN

## 2015-02-14 NOTE — Progress Notes (Signed)
Pt refused glipizide this evening

## 2015-02-14 NOTE — Progress Notes (Signed)
Advanced Heart Failure Rounding Note  Primary Physician: Dr Madilyn Hook Primary HF Cardiologist: Dr Shirlee Latch  Reason for Admission: Heart Failure Volume Overload  Subjective:    Feels better but continues to only take her medications as she wishes.  Refused evening dose of lasix, and only wants 40 mg IV this am.  Agreed to take a second 40 mg this afternoon.  Discussed the importance of getting her weight down and fluid off prior to going home so that her po diuretics will be effective.  She wants to go home this evening, but is willing to stay one more night.  Out 3.6 L and down 3 lbs despite refusing her evening dose of Lasix.  Received 80 mg IV once yesterday. Cr 1.05, stable.  Objective:   Weight Range: 270 lb 3.2 oz (122.562 kg) Body mass index is 37.7 kg/(m^2).   Vital Signs:   Temp:  [97.7 F (36.5 C)-98.2 F (36.8 C)] 98.2 F (36.8 C) (09/30 0456) Pulse Rate:  [69-94] 94 (09/30 0456) Resp:  [18-20] 18 (09/30 0456) BP: (95-121)/(73-83) 121/83 mmHg (09/30 0456) SpO2:  [97 %] 97 % (09/30 0456) Weight:  [270 lb 3.2 oz (122.562 kg)] 270 lb 3.2 oz (122.562 kg) (09/30 0456) Last BM Date: 02/12/15  Weight change: Filed Weights   02/12/15 1539 02/13/15 0421 02/14/15 0456  Weight: 279 lb 15.8 oz (127 kg) 273 lb 9.6 oz (124.104 kg) 270 lb 3.2 oz (122.562 kg)    Intake/Output:   Intake/Output Summary (Last 24 hours) at 02/14/15 0737 Last data filed at 02/14/15 0457  Gross per 24 hour  Intake    240 ml  Output   3900 ml  Net  -3660 ml     Physical Exam: General: Well appearing. No resp difficulty HEENT: normal Neck: supple. JVP 11-12 cm. Carotids 2+ bilat; no bruits. No lymphadenopathy or thyromegaly noted Cor: PMI nondisplaced. Mildly tachy, irregular rate & rhythm. No rubs, gallops or murmurs Lungs: Clear Abdomen: obese, soft, nontender, nondistended, No HSM. No bruits or masses. Good bowel sounds. Extremities: no cyanosis, clubbing, rash, R and L leg 2+ edema to  thighs Neuro: alert & orientedx3, cranial nerves grossly intact. moves all 4 extremities w/o difficulty. Affect flat  Telemetry: Afib 100s  Labs: CBC  Recent Labs  02/12/15 0756  WBC 11.1*  HGB 11.9*  HCT 36.7  MCV 101.1*  PLT 180   Basic Metabolic Panel  Recent Labs  02/13/15 1720 02/14/15 0520  NA 137 136  K 3.9 4.4  CL 104 103  CO2 26 24  GLUCOSE 103* 176*  BUN 17 15  CALCIUM 8.6* 8.7*   Liver Function Tests No results for input(s): AST, ALT, ALKPHOS, BILITOT, PROT, ALBUMIN in the last 72 hours. No results for input(s): LIPASE, AMYLASE in the last 72 hours. Cardiac Enzymes No results for input(s): CKTOTAL, CKMB, CKMBINDEX, TROPONINI in the last 72 hours.  BNP: BNP (last 3 results)  Recent Labs  01/18/15 0115 01/22/15 0212 02/12/15 0757  BNP 943.2* 872.8* 1107.0*    ProBNP (last 3 results)  Recent Labs  03/21/14 2126 03/22/14 1312 04/27/14 0128  PROBNP 2946.0* 3181.0* 5351.0*     D-Dimer No results for input(s): DDIMER in the last 72 hours. Hemoglobin A1C No results for input(s): HGBA1C in the last 72 hours. Fasting Lipid Panel No results for input(s): CHOL, HDL, LDLCALC, TRIG, CHOLHDL, LDLDIRECT in the last 72 hours. Thyroid Function Tests No results for input(s): TSH, T4TOTAL, T3FREE, THYROIDAB in the last 72 hours.  Invalid input(s): FREET3  Other results:     Imaging/Studies:  No results found.  Latest Echo  Latest Cath   Medications:     Scheduled Medications: . aspirin  325 mg Oral Daily  . digoxin  0.125 mg Oral Daily  . diltiazem  120 mg Oral Daily  . enoxaparin (LOVENOX) injection  40 mg Subcutaneous Q24H  . furosemide  80 mg Intravenous BID  . glipiZIDE  5 mg Oral QAC breakfast  . lisinopril  10 mg Oral Daily  . metoprolol succinate  25 mg Oral BID  . potassium chloride SA  40 mEq Oral BID  . sodium chloride  3 mL Intravenous Q12H    Infusions:    PRN Medications: sodium chloride, acetaminophen,  diphenhydrAMINE, ondansetron (ZOFRAN) IV, sodium chloride   Assessment/Plan   Mrs Dethlefs has been re-admitted with recurrent atrial fibrillation/RVR and acute on chronic systolic CHF. She has had multiple admissions with the same. She remains noncompliant with meds at home and refuses to followup in the outpatient setting.. She has never been seen in follow up in the HF clinic.   1. Acute on chronic systolic CHF: EF 16-10%.  - Volume status elevated in the setting on medication noncompliance.  -  She remains in fluid overload but diuresed well yesterday, despite refusing evening dose. Only willing to take 40 mg at a time, but willing to take a second dose this afternoon. - Potassium improved. Will adjust K according to her lasix. (Will hold evening dose if refuses 2nd lasix. Nurse aware) - Continue home dose of digoxin, Toprol XL, lisinopril. 2. Atrial fibrillation: Mild RVR initially.  - Rate has improved. D/C drip. Continue po diltiazem.  - She is not candidate for anticoagulation due to noncompliance.  3. Noncompliance:  - Marked noncompliance, will not be seen in the outpatient setting and takes medications as she thinks they should be taken. - High risk for future admission.  - Continues to refuse paramedicine and HH. States she will likely not follow up as an outpatient. - HF SW and Navigator saw 02/12/15. Will provide medications on discharge.  Dispo: She wants to go home today.  She agreed to stay until tomorrow, but it is unclear if she will follow through or leave AMA as she has done previously.   Length of Stay: 2   Graciella Freer PA-C 02/14/2015, 7:37 AM  Advanced Heart Failure Team Pager [phone] (M-F; 7a - 4p)  Please contact [facility] for night-coverage after hours (4p -7a ) and weekends on [email or web address]  Patient seen with PA, agree with the above note.    See above notes about challenges inherent in taking care of her (marked noncompliance).    HR  still high, increase Toprol XL to 37.5 mg bid.  Can continue diltiazem CD 120 daily (have compromised with her on a low dose of diltiazem despite low EF).  Not candidate for anticoagulation due to poor compliance.   Still volume overloaded.  Refuses Lasix 80 mg IV, will take 40 mg IV twice a day.  Will give that today.  She will stay one more day but insists on home tomorrow.  Would send home on Lasix 60 mg po bid.    Will try to convince her to followup.  Will need to give her a supply of meds at discharge as well as prescriptions with lots of refills.   [name] 02/14/2015 12:49 PM

## 2015-02-15 DIAGNOSIS — I5023 Acute on chronic systolic (congestive) heart failure: Secondary | ICD-10-CM | POA: Diagnosis not present

## 2015-02-15 LAB — BASIC METABOLIC PANEL
Anion gap: 8 (ref 5–15)
BUN: 15 mg/dL (ref 6–20)
CO2: 27 mmol/L (ref 22–32)
Calcium: 9 mg/dL (ref 8.9–10.3)
Chloride: 101 mmol/L (ref 101–111)
Creatinine, Ser: 1.06 mg/dL — ABNORMAL HIGH (ref 0.44–1.00)
GFR calc Af Amer: >60 mL/min (ref >60)
GFR calc non Af Amer: 54 mL/min — ABNORMAL LOW (ref >60)
Glucose, Bld: 182 mg/dL — ABNORMAL HIGH (ref 65–99)
Potassium: 4.4 mmol/L (ref 3.5–5.1)
Sodium: 136 mmol/L (ref 135–145)

## 2015-02-15 MED ORDER — DIGOXIN 125 MCG PO TABS: 0.1250 mg | ORAL_TABLET | Freq: Every day | ORAL | Status: DC

## 2015-02-15 MED ORDER — METOPROLOL SUCCINATE ER 25 MG PO TB24: 37.5000 mg | ORAL_TABLET | Freq: Two times a day (BID) | ORAL | Status: DC

## 2015-02-15 MED ORDER — FUROSEMIDE 40 MG PO TABS
60.0000 mg | ORAL_TABLET | Freq: Two times a day (BID) | ORAL | Status: DC
  Administered 2015-02-16: 60 mg via ORAL
  Filled 2015-02-15 (×2): qty 1

## 2015-02-15 MED ORDER — METOPROLOL SUCCINATE ER 25 MG PO TB24
12.5000 mg | ORAL_TABLET | Freq: Once | ORAL | Status: AC
  Administered 2015-02-15: 12.5 mg via ORAL
  Filled 2015-02-15: qty 1

## 2015-02-15 MED ORDER — DILTIAZEM HCL ER COATED BEADS 120 MG PO CP24: 120.0000 mg | ORAL_CAPSULE | Freq: Every day | ORAL | Status: DC

## 2015-02-15 MED ORDER — LISINOPRIL 10 MG PO TABS: 10.0000 mg | ORAL_TABLET | Freq: Every day | ORAL | Status: DC

## 2015-02-15 MED ORDER — METOPROLOL SUCCINATE ER 50 MG PO TB24: 50.0000 mg | ORAL_TABLET | Freq: Two times a day (BID) | ORAL | Status: DC

## 2015-02-15 MED ORDER — FUROSEMIDE 20 MG PO TABS: 60.0000 mg | ORAL_TABLET | Freq: Two times a day (BID) | ORAL | Status: DC

## 2015-02-15 MED ORDER — METOPROLOL SUCCINATE ER 50 MG PO TB24: 75.0000 mg | ORAL_TABLET | Freq: Two times a day (BID) | ORAL | Status: DC

## 2015-02-15 MED ORDER — DILTIAZEM HCL ER COATED BEADS 240 MG PO CP24
240.0000 mg | ORAL_CAPSULE | Freq: Every day | ORAL | Status: DC
  Administered 2015-02-16: 240 mg via ORAL
  Filled 2015-02-15: qty 1

## 2015-02-15 MED ORDER — METOPROLOL SUCCINATE ER 25 MG PO TB24
25.0000 mg | ORAL_TABLET | Freq: Two times a day (BID) | ORAL | Status: DC
  Administered 2015-02-15: 25 mg via ORAL
  Filled 2015-02-15: qty 1

## 2015-02-15 NOTE — Progress Notes (Signed)
Refuses use of bed alarm 

## 2015-02-15 NOTE — Discharge Summary (Signed)
Physician Discharge Summary     Cardiologist:  Shirlee Latch Patient ID: Kaitlyn [name] MRN: [id number] DOB/AGE: 65-Jan-1952 65 y.o.  Admit date: 02/12/2015 Discharge date: 02/15/2015  Admission Diagnoses:  Acute on chronic congestive heart failure  Discharge Diagnoses:  Active Problems:   Acute on chronic congestive heart failure   atrial fibrillation with rapid ventricular response   Noncompliance  Discharged Condition: stable  Hospital Course:   Kaitlyn [name] is a 64 y.o. female with a history of chronic afib, CHF (EF 20-25%), DM, obesity, noncompliance with medical treatment including 13 ER visits and 6 hospital admissions for CHF exacerbation 2/2 noncompliance in the last 6 months who presents with recurrent shortness of breath and was found to be in afib with RVR.  Apparently she ran out of her medicaitons a few weeks and stopped her lasix a few days ago due to medication cost. Says she didn't have enough money to pay medication. She is essentially house bound and to date she has never followed up in the HF clinic. Says she has had progressive dyspnea and lower extremity edema. + Orthopnea. She does not follow a particular diet. She does not weigh at home She refuses HH and paramedicine. Weight is up > 20 lbs.  Pertinent admission labs include: K 3.9 Creatinine 1.09 BNP 1107 Hgb 11.9. CXR- No acute disease.   Patient was admitted for acute on chronic congestive heart failure. She was started on 80 mg of IV Lasix twice a day. Patient refused 80 mg instead she would only take 40 and also refused the evening dose..  Digoxin, metoprolol XL and lisinopril were continued. She was started on 5 mg of IV diltiazem for mildly elevated atrial fibrillation rate.  She was actually switched to by mouth diltiazem. Metoprolol was increased to 37.5 mg twice a day She's not anticoagulation candidate due to noncompliance.  CHADSVASC = 4.  She ultimately diuresed 9.7 L was changed to by mouth Lasix 60 mg twice  a day(the patient states she Lindora Alviar only take 40 mg).  She also told me that she doesn't want the pharmacy to get any her medications ready until she can call them and find out how much they Leeona Mccardle cost.  Her follow-up appointment has been arranged, however, the patient states she Nahal Wanless likely not follow-up. Serum creatinine has remained stable. The patient was seen by Dr. Elberta Fortis who felt she was stable for DC home.   Consults: Diabetes coronary vein are  Significant Diagnostic Studies:  CHEST 2 VIEW  COMPARISON: PA and lateral chest 01/18/2015 and 01/21/2015.  FINDINGS: There is cardiomegaly without edema. The lungs are clear. No pneumothorax is identified. There may be a trace amount of pleural fluid on the right, decreased since the prior study.  IMPRESSION: Cardiomegaly without acute disease.  Treatments: See above  Discharge Exam: Blood pressure 131/81, pulse 86, temperature 97.8 F (36.6 C), temperature source Oral, resp. rate 20, height  (1.803 m), weight 257 lb 12.8 oz (116.937 kg), SpO2 95 %.   Disposition: 01-Home or Self Care      Discharge Instructions    Diet - low sodium heart healthy    Complete by:  As directed      Increase activity slowly    Complete by:  As directed             Medication List    STOP taking these medications        glipiZIDE 5 MG tablet  Commonly known as:  GLUCOTROL  TAKE these medications        aspirin 325 MG tablet  Take 1 tablet (325 mg total) by mouth daily.     digoxin 0.125 MG tablet  Commonly known as:  LANOXIN  Take 1 tablet (0.125 mg total) by mouth daily.     diltiazem 120 MG 24 hr capsule  Commonly known as:  CARDIZEM CD  Take 1 capsule (120 mg total) by mouth daily.     diphenhydrAMINE 25 MG tablet  Commonly known as:  BENADRYL  Take 1 tablet (25 mg total) by mouth every 8 (eight) hours as needed for itching or allergies.     furosemide 20 MG tablet  Commonly known as:  LASIX  Take 3 tablets  (60 mg total) by mouth 2 (two) times daily.     lisinopril 10 MG tablet  Commonly known as:  PRINIVIL,ZESTRIL  Take 1 tablet (10 mg total) by mouth daily.     metoprolol succinate 25 MG 24 hr tablet  Commonly known as:  TOPROL-XL  Take 1.5 tablets (37.5 mg total) by mouth 2 (two) times daily.     potassium chloride SA 20 MEQ tablet  Commonly known as:  K-DUR,KLOR-CON  Take 1 tablet (20 mEq total) by mouth 2 (two) times daily.       Follow-up Information    Follow up with [name], MD On 02/28/2015.   Specialty:  Cardiology   Why:  at 1020 am for post hospital follow up. Please bring all of your medications to the visit.   Contact information:   [address] [place] [address] [phone]      Greater than 30 minutes was spent completing the patient's discharge.   SignedHurshel Party 02/15/2015, 11:26 AM  I have seen and examined this patient with Wilburt Finlay.  Agree with above, note added to reflect my findings.  On exam, irregular and tachycardic, no wheezing or rales.  Patient feeling well but says that she is leaving today.  Had elevated HR while on toilet in AF, but still insists on leaving the hospital.  Plan for discharge with increased dose of toprol XL and lasix 60 mg with follow up in HF clinic.    Charese Abundis M. Tanav Orsak MD 02/15/2015 5:49 PM

## 2015-02-15 NOTE — Progress Notes (Signed)
Advanced Heart Failure Rounding Note  Primary Physician: Dr Madilyn Hook Primary HF Cardiologist: Dr Shirlee Latch  Reason for Admission: Heart Failure Volume Overload  Subjective:    Feels better and diuresed well yesterday.  Says she is SOB when moving around in bed and getting back into bed, but feels about at her baseline.     Out 9.7 L and down 13 lbs since 9/30.  Received 80 mg IV once yesterday. Cr 1.05, stable.  Objective:   Weight Range: 257 lb 12.8 oz (116.937 kg) Body mass index is 35.97 kg/(m^2).   Vital Signs:   Temp:  [97.7 F (36.5 C)-97.8 F (36.6 C)] 97.8 F (36.6 C) (10/01 0530) Pulse Rate:  [84-99] 86 (10/01 0530) Resp:  [20] 20 (09/30 2109) BP: (109-131)/(74-86) 131/81 mmHg (10/01 0530) SpO2:  [95 %-98 %] 95 % (10/01 0530) Weight:  [257 lb 12.8 oz (116.937 kg)] 257 lb 12.8 oz (116.937 kg) (10/01 0530) Last BM Date: 02/14/15  Weight change: Filed Weights   02/13/15 0421 02/14/15 0456 02/15/15 0530  Weight: 273 lb 9.6 oz (124.104 kg) 270 lb 3.2 oz (122.562 kg) 257 lb 12.8 oz (116.937 kg)    Intake/Output:   Intake/Output Summary (Last 24 hours) at 02/15/15 0904 Last data filed at 02/14/15 2212  Gross per 24 hour  Intake    360 ml  Output   5325 ml  Net  -4965 ml     Physical Exam: General: Well appearing. No resp difficulty HEENT: normal Neck: supple. JVP 11-12 cm. Carotids 2+ bilat; no bruits. No lymphadenopathy or thyromegaly noted Cor: PMI nondisplaced. Mildly tachy, irregular rate & rhythm. No rubs, gallops or murmurs Lungs: Clear Abdomen: obese, soft, nontender, nondistended, No HSM. No bruits or masses. Good bowel sounds. Extremities: no cyanosis, clubbing, rash, R and L leg 2+ edema to thighs Neuro: alert & orientedx3, cranial nerves grossly intact. moves all 4 extremities w/o difficulty. Affect flat  Telemetry: Afib 100s  Labs: CBC No results for input(s): WBC, NEUTROABS, HGB, HCT, MCV, PLT in the last 72 hours. Basic Metabolic  Panel  Recent Labs  16/10/96 0520 02/15/15 0600  NA 136 136  K 4.4 4.4  CL 103 101  CO2 24 27  GLUCOSE 176* 182*  BUN 15 15  CALCIUM 8.7* 9.0   Liver Function Tests No results for input(s): AST, ALT, ALKPHOS, BILITOT, PROT, ALBUMIN in the last 72 hours. No results for input(s): LIPASE, AMYLASE in the last 72 hours. Cardiac Enzymes No results for input(s): CKTOTAL, CKMB, CKMBINDEX, TROPONINI in the last 72 hours.  BNP: BNP (last 3 results)  Recent Labs  01/18/15 0115 01/22/15 0212 02/12/15 0757  BNP 943.2* 872.8* 1107.0*    ProBNP (last 3 results)  Recent Labs  03/21/14 2126 03/22/14 1312 04/27/14 0128  PROBNP 2946.0* 3181.0* 5351.0*     D-Dimer No results for input(s): DDIMER in the last 72 hours. Hemoglobin A1C No results for input(s): HGBA1C in the last 72 hours. Fasting Lipid Panel No results for input(s): CHOL, HDL, LDLCALC, TRIG, CHOLHDL, LDLDIRECT in the last 72 hours. Thyroid Function Tests No results for input(s): TSH, T4TOTAL, T3FREE, THYROIDAB in the last 72 hours.  Invalid input(s): FREET3  Other results:     Imaging/Studies:  No results found.  Latest Echo  Latest Cath   Medications:     Scheduled Medications: . aspirin  325 mg Oral Daily  . digoxin  0.125 mg Oral Daily  . diltiazem  120 mg Oral Daily  . enoxaparin (LOVENOX) injection  40 mg Subcutaneous Q24H  . furosemide  40 mg Intravenous BID  . glipiZIDE  5 mg Oral QAC breakfast  . lisinopril  10 mg Oral Daily  . metoprolol succinate  37.5 mg Oral BID  . potassium chloride SA  20 mEq Oral BID  . sodium chloride  3 mL Intravenous Q12H    Infusions:    PRN Medications: sodium chloride, acetaminophen, diphenhydrAMINE, ondansetron (ZOFRAN) IV, sodium chloride   Assessment/Plan   Mrs Ramelb has been re-admitted with recurrent atrial fibrillation/RVR and acute on chronic systolic CHF. She has had multiple admissions with the same. She remains noncompliant with  meds at home and refuses to followup in the outpatient setting.. She has never been seen in follow up in the HF clinic.   1. Acute on chronic systolic CHF: EF 03-01%.  - Volume status improved today with aggressive diuresis yesterday.  -  Gannon Heinzman receive AM dose of lasix and possibly D/C today with 60 BID lasix at home pending results of diuresis. - Potassium improved.   2. Atrial fibrillation: Mild RVR initially.  - Rate has improved. D/C drip. Continue po diltiazem.  - She is not candidate for anticoagulation due to noncompliance.   3. Noncompliance:  - Marked noncompliance, Quinisha Mould not be seen in the outpatient setting and takes medications as she thinks they should be taken. - High risk for future admission.  - Continues to refuse paramedicine and HH. States she Shakema Surita likely not follow up as an outpatient. - HF SW and Navigator saw 02/12/15. Vonte Rossin provide medications on discharge.  Dispo: She wants to go home today.  She agreed to stay until tomorrow, but it is unclear if she Dirk Vanaman follow through or leave AMA as she has done previously.   Length of Stay: 3  Kaitlyn [name] Kaitlyn [name] 02/15/2015 9:04 AM

## 2015-02-15 NOTE — Progress Notes (Signed)
Pt refused glipizide and bed alarm

## 2015-02-16 DIAGNOSIS — I5023 Acute on chronic systolic (congestive) heart failure: Secondary | ICD-10-CM | POA: Diagnosis not present

## 2015-02-16 LAB — BASIC METABOLIC PANEL
Anion gap: 9 (ref 5–15)
BUN: 17 mg/dL (ref 6–20)
CO2: 26 mmol/L (ref 22–32)
Calcium: 9.2 mg/dL (ref 8.9–10.3)
Chloride: 103 mmol/L (ref 101–111)
Creatinine, Ser: 1.11 mg/dL — ABNORMAL HIGH (ref 0.44–1.00)
GFR calc Af Amer: 59 mL/min — ABNORMAL LOW (ref >60)
GFR calc non Af Amer: 51 mL/min — ABNORMAL LOW (ref >60)
Glucose, Bld: 209 mg/dL — ABNORMAL HIGH (ref 65–99)
Potassium: 4.4 mmol/L (ref 3.5–5.1)
Sodium: 138 mmol/L (ref 135–145)

## 2015-02-16 MED ORDER — METOPROLOL SUCCINATE ER 50 MG PO TB24: 50.0000 mg | ORAL_TABLET | Freq: Two times a day (BID) | ORAL | Status: DC

## 2015-02-16 MED ORDER — DILTIAZEM HCL ER COATED BEADS 240 MG PO CP24: 240.0000 mg | ORAL_CAPSULE | Freq: Every day | ORAL | Status: DC

## 2015-02-16 MED ORDER — METOPROLOL SUCCINATE ER 50 MG PO TB24
50.0000 mg | ORAL_TABLET | Freq: Two times a day (BID) | ORAL | Status: DC
  Administered 2015-02-16: 50 mg via ORAL
  Filled 2015-02-16: qty 1

## 2015-02-16 NOTE — Progress Notes (Signed)
Advanced Heart Failure Rounding Note  Primary Physician: Dr Madilyn Hook Primary HF Cardiologist: Dr Shirlee Latch  Reason for Admission: Heart Failure Volume Overload  Subjective:    Feels better and diuresed well yesterday.  Says she is SOB has improved when moving around in bed and getting back into bed, but feels about at her baseline.   No discharge yesterday as her HR was elevated.      Objective:   Weight Range: 253 lb 11.2 oz (115.078 kg) Body mass index is 35.4 kg/(m^2).   Vital Signs:   Temp:  [97.9 F (36.6 C)-98 F (36.7 C)] 97.9 F (36.6 C) (10/02 0445) Pulse Rate:  [100-114] 104 (10/02 0445) Resp:  [20] 20 (10/01 1409) BP: (106-120)/(71-82) 120/82 mmHg (10/02 0445) SpO2:  [92 %-97 %] 97 % (10/02 0445) Weight:  [253 lb 11.2 oz (115.078 kg)] 253 lb 11.2 oz (115.078 kg) (10/02 0445) Last BM Date: 02/15/15  Weight change: Filed Weights   02/14/15 0456 02/15/15 0530 02/16/15 0445  Weight: 270 lb 3.2 oz (122.562 kg) 257 lb 12.8 oz (116.937 kg) 253 lb 11.2 oz (115.078 kg)    Intake/Output:   Intake/Output Summary (Last 24 hours) at 02/16/15 0707 Last data filed at 02/15/15 2200  Gross per 24 hour  Intake    720 ml  Output   1575 ml  Net   -855 ml     Physical Exam: General: Well appearing. No resp difficulty HEENT: normal Neck: supple. JVP 11-12 cm. Carotids 2+ bilat; no bruits. No lymphadenopathy or thyromegaly noted Cor: PMI nondisplaced. Mildly tachy, irregular rate & rhythm. No rubs, gallops or murmurs Lungs: Clear Abdomen: obese, soft, nontender, nondistended, No HSM. No bruits or masses. Good bowel sounds. Extremities: no cyanosis, clubbing, rash, R and L leg 2+ edema to thighs Neuro: alert & orientedx3, cranial nerves grossly intact. moves all 4 extremities w/o difficulty. Affect flat  Telemetry: Afib 100s  Labs: CBC No results for input(s): WBC, NEUTROABS, HGB, HCT, MCV, PLT in the last 72 hours. Basic Metabolic Panel  Recent Labs  02/15/15 0600  02/16/15 0337  NA 136 138  K 4.4 4.4  CL 101 103  CO2 27 26  GLUCOSE 182* 209*  BUN 15 17  CALCIUM 9.0 9.2   Liver Function Tests No results for input(s): AST, ALT, ALKPHOS, BILITOT, PROT, ALBUMIN in the last 72 hours. No results for input(s): LIPASE, AMYLASE in the last 72 hours. Cardiac Enzymes No results for input(s): CKTOTAL, CKMB, CKMBINDEX, TROPONINI in the last 72 hours.  BNP: BNP (last 3 results)  Recent Labs  01/18/15 0115 01/22/15 0212 02/12/15 0757  BNP 943.2* 872.8* 1107.0*    ProBNP (last 3 results)  Recent Labs  03/21/14 2126 03/22/14 1312 04/27/14 0128  PROBNP 2946.0* 3181.0* 5351.0*     D-Dimer No results for input(s): DDIMER in the last 72 hours. Hemoglobin A1C No results for input(s): HGBA1C in the last 72 hours. Fasting Lipid Panel No results for input(s): CHOL, HDL, LDLCALC, TRIG, CHOLHDL, LDLDIRECT in the last 72 hours. Thyroid Function Tests No results for input(s): TSH, T4TOTAL, T3FREE, THYROIDAB in the last 72 hours.  Invalid input(s): FREET3  Other results:     Imaging/Studies:  No results found.  Latest Echo  Latest Cath   Medications:     Scheduled Medications: . aspirin  325 mg Oral Daily  . digoxin  0.125 mg Oral Daily  . diltiazem  240 mg Oral Daily  . enoxaparin (LOVENOX) injection  40 mg Subcutaneous Q24H  .  furosemide  60 mg Oral BID  . glipiZIDE  5 mg Oral QAC breakfast  . lisinopril  10 mg Oral Daily  . metoprolol succinate  25 mg Oral BID  . potassium chloride SA  20 mEq Oral BID  . sodium chloride  3 mL Intravenous Q12H    Infusions:    PRN Medications: sodium chloride, acetaminophen, diphenhydrAMINE, ondansetron (ZOFRAN) IV, sodium chloride   Assessment/Plan   Kaitlyn [name] has been re-admitted with recurrent atrial fibrillation/RVR and acute on chronic systolic CHF. She has had multiple admissions with the same. She remains noncompliant with meds at home and refuses to followup in the  outpatient setting.. She has never been seen in follow up in the HF clinic.   1. Acute on chronic systolic CHF: EF 15-05%.  - Volume status improved today with aggressive diuresis yesterday.  -  Kaitlyn [name] receive AM dose of lasix PO and monitor diuresis.  Possible d/c today. - Potassium improved.   2. Atrial fibrillation: Mild RVR initially.  - Rate increased yesterday but back down in the 90-100s today.  Kaitlyn [name] increase dose of Toprol XL and hopefully Kaitlyn [name] be able to go home today.   3. Noncompliance:  - Marked noncompliance, Kaitlyn [name] not be seen in the outpatient setting and takes medications as she thinks they should be taken. - High risk for future admission.  - Continues to refuse paramedicine and HH. States she Kaitlyn [name] likely not follow up as an outpatient. - HF SW and Navigator saw 02/12/15. Kaitlyn [name] provide medications on discharge.   Kaitlyn [name] Kaitlyn [name] Kaitlyn [name] 02/16/2015 7:07 AM

## 2015-02-16 NOTE — Discharge Instructions (Signed)
Weigh daily Call Dr. Shirlee Latch if weight climbs more than 3 pounds in a day or 5 pounds in a week. No salt to very little salt in your diet.  No more than 2000 mg in a day. Call if increased shortness of breath or increased swelling.  Call if your HR begins to bother you.

## 2015-02-16 NOTE — Progress Notes (Signed)
Pt d/c'd via [facility] to private vehicle. I reviewed her d/c instructions and she didn't have any further questions. MD office left sample meds at desk for her which I gave at discharge. Pt in stable conditition

## 2015-02-16 NOTE — Discharge Summary (Signed)
Physician Discharge Summary      ADDENDUM to DISCHARGE SUMMARY   Patient ID: Kaitlyn [name] MRN: [id number] DOB/AGE: [birth year] 64 y.o.  Admit date: 02/12/2015 Discharge date: 02/16/2015 Primary Cardiologist:[place]  Discharge Diagnoses:  Principal Problem:   Acute on chronic congestive heart failure (HCC) Active Problems:   H/O noncompliance with medical treatment, presenting hazards to health   Atrial fibrillation with RVR [facility])   Discharged Condition: good  Procedures: see other records  Hospital Course: please see previous note.  Pt kept an extra day due to rapid a fib.  Her meds were adjusted but by 02/16/15 her HR was improved with at rest 90s and exertion 130s but down from 187 on the day before.  Pt adamant about going home.  Discussed with Dr. Elberta Fortis, difficult to increase meds with BP now 108/71 to 119/87.  She Muranda Coye call if further problems.    Discharge Exam: Blood pressure 108/71, pulse 106, temperature 97.8 F (36.6 C), temperature source Oral, resp. rate 19, height 5\' 11"  (1.803 m), weight 253 lb 11.2 oz (115.078 kg), SpO2 94 %.   Disposition: 01-Home or Self Care      Discharge Instructions    Diet - low sodium heart healthy    Complete by:  As directed      Increase activity slowly    Complete by:  As directed             Medication List    STOP taking these medications        glipiZIDE 5 MG tablet  Commonly known as:  GLUCOTROL      TAKE these medications        aspirin 325 MG tablet  Take 1 tablet (325 mg total) by mouth daily.     digoxin 0.125 MG tablet  Commonly known as:  LANOXIN  Take 1 tablet (0.125 mg total) by mouth daily.     diltiazem 240 MG 24 hr capsule  Commonly known as:  CARDIZEM CD  Take 1 capsule (240 mg total) by mouth daily.     diphenhydrAMINE 25 MG tablet  Commonly known as:  BENADRYL  Take 1 tablet (25 mg total) by mouth every 8 (eight) hours as needed for itching or allergies.     furosemide 20 MG tablet    Commonly known as:  LASIX  Take 3 tablets (60 mg total) by mouth 2 (two) times daily.     lisinopril 10 MG tablet  Commonly known as:  PRINIVIL,ZESTRIL  Take 1 tablet (10 mg total) by mouth daily.     metoprolol succinate 50 MG 24 hr tablet  Commonly known as:  TOPROL-XL  Take 1 tablet (50 mg total) by mouth 2 (two) times daily. Take with or immediately following a meal.     potassium chloride SA 20 MEQ tablet  Commonly known as:  K-DUR,KLOR-CON  Take 1 tablet (20 mEq total) by mouth 2 (two) times daily.       Follow-up Information    Follow up with [name], MD On 02/28/2015.   Specialty:  Cardiology   Why:  at 1020 am for post hospital follow up. Please bring all of your medications to the visit.   Contact information:   [address] [place] [address] [phone]        Discharge Instructions: Weigh daily Call Dr. Shirlee Latch if weight climbs more than 3 pounds in a day or 5 pounds in a week. No salt to very little  salt in your diet.  No more than 2000 mg in a day. Call if increased shortness of breath or increased swelling.  Call if your HR begins to bother you.     Signed: Leone Brand Nurse Practitioner-Certified [facility]: [facility] 02/16/2015, 1:49 PM  Time spent on discharge : > 30 minutes.    I have seen and examined this patient with Nada Boozer.  Agree with above, note added to reflect my findings.  On exam, irregular and tachycardic, lungs clear.  Patient with atrial fibrillation and heart failure.  Has been diuresed and respiratory status has improved.  In AF with elevated rates, have increased beta blocker dose for better outpatient HF control.    Jaymarie Yeakel M. Gioia Ranes MD 02/16/2015 2:37 PM

## 2015-02-17 ENCOUNTER — Telehealth (HOSPITAL_COMMUNITY): Payer: Self-pay | Admitting: Surgery

## 2015-02-17 NOTE — Telephone Encounter (Signed)
Heart Failure Nurse Navigator Post Discharge Telephone Call  I called to check on Kaitlyn [name] after her recent hospitalization.  She tells me that she is feeling "very well".  She says that she believes the addition of the Cardizem has helped her to feel "less SOB" and "now doesn't feel my heart racing".  She claims that she has been taking her prescribed medications including her Lasix twice daily.  I reminded her of follow-up appt in the AHF clinic on Friday October 14th.  She says she is not sure that she can make it and I emphasized that the goal of the appointment is to help her remain at home and out of the hospital.  I have asked her to call me next week--or I will call her and determine if she can come to appt so that I can set up taxi transportation.  She is agreeable to that plan.

## 2015-02-26 ENCOUNTER — Telehealth (HOSPITAL_COMMUNITY): Payer: Self-pay | Admitting: Surgery

## 2015-02-26 NOTE — Telephone Encounter (Signed)
I left message with Kaitlyn [name] regarding her upcoming appt on Friday October 14th with the AHF Clinic.  I asked her to call me back to confirm that she was coming and in order to set up her taxi cab transportation.

## 2015-02-27 ENCOUNTER — Telehealth (HOSPITAL_COMMUNITY): Payer: Self-pay | Admitting: Surgery

## 2015-02-27 NOTE — Telephone Encounter (Signed)
Kaitlyn [name] called clinic to change her appt to October 28th.  She tells me that tomorrow's appt is "just too soon" after her hospitalization.  She says she feels great.  I will plan to call her prior to October 28th to confirm appt and set up taxi transportation.

## 2015-02-28 ENCOUNTER — Encounter (HOSPITAL_COMMUNITY): Payer: Self-pay

## 2015-03-12 ENCOUNTER — Telehealth (HOSPITAL_COMMUNITY): Payer: Self-pay | Admitting: Surgery

## 2015-03-14 ENCOUNTER — Encounter (HOSPITAL_COMMUNITY): Payer: Self-pay

## 2015-03-21 NOTE — Telephone Encounter (Signed)
Ms. Perrier returned call to [facility] and again asked to reschedule her appt.

## 2015-03-24 ENCOUNTER — Encounter (HOSPITAL_COMMUNITY): Payer: Self-pay | Admitting: Emergency Medicine

## 2015-03-24 ENCOUNTER — Inpatient Hospital Stay (HOSPITAL_COMMUNITY)
Admission: EM | Admit: 2015-03-24 | Discharge: 2015-03-27 | DRG: 308 | Disposition: A | Discharge status: Home / Self Care | Payer: Medicaid - Out of State | Attending: Cardiology | Admitting: Cardiology

## 2015-03-24 DIAGNOSIS — I429 Cardiomyopathy, unspecified: Secondary | ICD-10-CM | POA: Diagnosis not present

## 2015-03-24 DIAGNOSIS — E119 Type 2 diabetes mellitus without complications: Secondary | ICD-10-CM | POA: Diagnosis present

## 2015-03-24 DIAGNOSIS — I5023 Acute on chronic systolic (congestive) heart failure: Secondary | ICD-10-CM | POA: Diagnosis not present

## 2015-03-24 DIAGNOSIS — E669 Obesity, unspecified: Secondary | ICD-10-CM | POA: Diagnosis present

## 2015-03-24 DIAGNOSIS — I4819 Other persistent atrial fibrillation: Secondary | ICD-10-CM

## 2015-03-24 DIAGNOSIS — M1990 Unspecified osteoarthritis, unspecified site: Secondary | ICD-10-CM | POA: Diagnosis present

## 2015-03-24 DIAGNOSIS — I5022 Chronic systolic (congestive) heart failure: Secondary | ICD-10-CM | POA: Diagnosis present

## 2015-03-24 DIAGNOSIS — Z659 Problem related to unspecified psychosocial circumstances: Secondary | ICD-10-CM

## 2015-03-24 DIAGNOSIS — I10 Essential (primary) hypertension: Secondary | ICD-10-CM | POA: Diagnosis present

## 2015-03-24 DIAGNOSIS — Z6836 Body mass index (BMI) 36.0-36.9, adult: Secondary | ICD-10-CM

## 2015-03-24 DIAGNOSIS — Z801 Family history of malignant neoplasm of trachea, bronchus and lung: Secondary | ICD-10-CM

## 2015-03-24 DIAGNOSIS — I482 Chronic atrial fibrillation, unspecified: Secondary | ICD-10-CM | POA: Diagnosis present

## 2015-03-24 DIAGNOSIS — I4891 Unspecified atrial fibrillation: Secondary | ICD-10-CM | POA: Diagnosis not present

## 2015-03-24 DIAGNOSIS — Z9114 Patient's other noncompliance with medication regimen: Secondary | ICD-10-CM

## 2015-03-24 DIAGNOSIS — Z800 Family history of malignant neoplasm of digestive organs: Secondary | ICD-10-CM

## 2015-03-24 DIAGNOSIS — Z833 Family history of diabetes mellitus: Secondary | ICD-10-CM

## 2015-03-24 DIAGNOSIS — I5043 Acute on chronic combined systolic (congestive) and diastolic (congestive) heart failure: Secondary | ICD-10-CM | POA: Diagnosis present

## 2015-03-24 DIAGNOSIS — I110 Hypertensive heart disease with heart failure: Secondary | ICD-10-CM | POA: Diagnosis present

## 2015-03-24 DIAGNOSIS — Z820 Family history of epilepsy and other diseases of the nervous system: Secondary | ICD-10-CM

## 2015-03-24 DIAGNOSIS — E1165 Type 2 diabetes mellitus with hyperglycemia: Secondary | ICD-10-CM | POA: Diagnosis present

## 2015-03-24 LAB — CBC WITH DIFFERENTIAL/PLATELET
Basophils Absolute: 0.1 10*3/uL (ref 0.0–0.1)
Basophils Relative: 1 %
Eosinophils Absolute: 0.4 10*3/uL (ref 0.0–0.7)
Eosinophils Relative: 3 %
HCT: 37.3 % (ref 36.0–46.0)
Hemoglobin: 12.1 g/dL (ref 12.0–15.0)
Lymphocytes Relative: 13 %
Lymphs Abs: 1.7 10*3/uL (ref 0.7–4.0)
MCH: 32.4 pg (ref 26.0–34.0)
MCHC: 32.4 g/dL (ref 30.0–36.0)
MCV: 99.7 fL (ref 78.0–100.0)
Monocytes Absolute: 0.6 10*3/uL (ref 0.1–1.0)
Monocytes Relative: 5 %
Neutro Abs: 10.3 10*3/uL — ABNORMAL HIGH (ref 1.7–7.7)
Neutrophils Relative %: 79 %
Platelets: 189 10*3/uL (ref 150–400)
RBC: 3.74 MIL/uL — ABNORMAL LOW (ref 3.87–5.11)
RDW: 15.7 % — ABNORMAL HIGH (ref 11.5–15.5)
WBC: 13.1 10*3/uL — ABNORMAL HIGH (ref 4.0–10.5)

## 2015-03-24 LAB — BASIC METABOLIC PANEL
Anion gap: 10 (ref 5–15)
BUN: 12 mg/dL (ref 6–20)
CO2: 21 mmol/L — ABNORMAL LOW (ref 22–32)
Calcium: 8.9 mg/dL (ref 8.9–10.3)
Chloride: 104 mmol/L (ref 101–111)
Creatinine, Ser: 0.9 mg/dL (ref 0.44–1.00)
GFR calc Af Amer: >60 mL/min (ref >60)
GFR calc non Af Amer: >60 mL/min (ref >60)
Glucose, Bld: 332 mg/dL — ABNORMAL HIGH (ref 65–99)
Potassium: 4.3 mmol/L (ref 3.5–5.1)
Sodium: 135 mmol/L (ref 135–145)

## 2015-03-24 LAB — DIGOXIN LEVEL: Digoxin Level: 0.6 ng/mL — ABNORMAL LOW (ref 0.8–2.0)

## 2015-03-24 LAB — I-STAT TROPONIN, ED: Troponin i, poc: 0.01 ng/mL (ref 0.00–0.08)

## 2015-03-24 LAB — BRAIN NATRIURETIC PEPTIDE: B Natriuretic Peptide: 665.2 pg/mL — ABNORMAL HIGH (ref 0.0–100.0)

## 2015-03-24 MED ORDER — SODIUM CHLORIDE 0.9 % IV SOLN: 250.0000 mL | INTRAVENOUS | Status: DC | PRN

## 2015-03-24 MED ORDER — SODIUM CHLORIDE 0.9 % IJ SOLN
3.0000 mL | Freq: Two times a day (BID) | INTRAMUSCULAR | Status: DC
  Administered 2015-03-24 – 2015-03-27 (×7): 3 mL via INTRAVENOUS

## 2015-03-24 MED ORDER — SODIUM CHLORIDE 0.9 % IJ SOLN: 3.0000 mL | INTRAMUSCULAR | Status: DC | PRN

## 2015-03-24 MED ORDER — POTASSIUM CHLORIDE CRYS ER 20 MEQ PO TBCR
20.0000 meq | EXTENDED_RELEASE_TABLET | Freq: Two times a day (BID) | ORAL | Status: DC
  Administered 2015-03-24 – 2015-03-27 (×6): 20 meq via ORAL
  Filled 2015-03-24 (×7): qty 1

## 2015-03-24 MED ORDER — LISINOPRIL 10 MG PO TABS
10.0000 mg | ORAL_TABLET | Freq: Every day | ORAL | Status: DC
  Administered 2015-03-24 – 2015-03-27 (×4): 10 mg via ORAL
  Filled 2015-03-24 (×4): qty 1

## 2015-03-24 MED ORDER — FUROSEMIDE 10 MG/ML IJ SOLN
40.0000 mg | Freq: Two times a day (BID) | INTRAMUSCULAR | Status: DC
  Administered 2015-03-24: 40 mg via INTRAVENOUS
  Filled 2015-03-24: qty 4

## 2015-03-24 MED ORDER — DILTIAZEM LOAD VIA INFUSION
10.0000 mg | Freq: Once | INTRAVENOUS | Status: AC
  Administered 2015-03-24: 10 mg via INTRAVENOUS
  Filled 2015-03-24: qty 10

## 2015-03-24 MED ORDER — METOPROLOL SUCCINATE ER 50 MG PO TB24
50.0000 mg | ORAL_TABLET | Freq: Two times a day (BID) | ORAL | Status: DC
  Administered 2015-03-24 – 2015-03-27 (×6): 50 mg via ORAL
  Filled 2015-03-24 (×7): qty 1

## 2015-03-24 MED ORDER — DILTIAZEM HCL 100 MG IV SOLR
5.0000 mg/h | INTRAVENOUS | Status: DC
  Administered 2015-03-24: 5 mg/h via INTRAVENOUS
  Filled 2015-03-24: qty 100

## 2015-03-24 MED ORDER — DIGOXIN 125 MCG PO TABS
0.1250 mg | ORAL_TABLET | Freq: Once | ORAL | Status: AC
  Administered 2015-03-24: 0.125 mg via ORAL
  Filled 2015-03-24: qty 1

## 2015-03-24 MED ORDER — ONDANSETRON HCL 4 MG/2ML IJ SOLN: 4.0000 mg | Freq: Four times a day (QID) | INTRAMUSCULAR | Status: DC | PRN

## 2015-03-24 MED ORDER — ASPIRIN 325 MG PO TABS
325.0000 mg | ORAL_TABLET | Freq: Every day | ORAL | Status: DC
  Administered 2015-03-25: 325 mg via ORAL
  Filled 2015-03-24 (×2): qty 1

## 2015-03-24 MED ORDER — DIGOXIN 125 MCG PO TABS
0.1250 mg | ORAL_TABLET | Freq: Every day | ORAL | Status: DC
  Administered 2015-03-25 – 2015-03-27 (×3): 0.125 mg via ORAL
  Filled 2015-03-24 (×3): qty 1

## 2015-03-24 MED ORDER — HEPARIN SODIUM (PORCINE) 5000 UNIT/ML IJ SOLN
5000.0000 [IU] | Freq: Three times a day (TID) | INTRAMUSCULAR | Status: DC
  Administered 2015-03-24 – 2015-03-27 (×10): 5000 [IU] via SUBCUTANEOUS
  Filled 2015-03-24 (×10): qty 1

## 2015-03-24 MED ORDER — DILTIAZEM HCL ER COATED BEADS 240 MG PO CP24
240.0000 mg | ORAL_CAPSULE | Freq: Every day | ORAL | Status: DC
  Administered 2015-03-24 – 2015-03-27 (×4): 240 mg via ORAL
  Filled 2015-03-24 (×4): qty 1

## 2015-03-24 MED ORDER — ACETAMINOPHEN 325 MG PO TABS
650.0000 mg | ORAL_TABLET | ORAL | Status: DC | PRN
  Administered 2015-03-26: 650 mg via ORAL
  Filled 2015-03-24: qty 2
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
## 2015-03-24 NOTE — ED Notes (Signed)
Patient here with complaint of shortness of breath and palpitations. Currently out of her cardiac medications including Digoxin, Lisinopril, and Cardizem. Has not taken those medications for 3 days. Per EMS she was requesting for them to give her these medications. Also has not been taking her Lasix.

## 2015-03-24 NOTE — Care Management Note (Signed)
Case Management Note  Patient Details  Name: Kaitlyn [name] MRN: [id number] Date of Birth: 1950/09/10  Subjective/Objective:     CHF               Action/Plan: Patient known from previous admissions - very difficult case, CM will follow for DCPAlexis Goodell  02/14/2015 Additional Comments: 1125 02-14-15 Tomi Bamberger, RN,BSN [phone] CM did speak with HF Navigator Driscilla Moats this am in regards to medications for pt. Per Dorene Grebe with HF will get medications via the HF Fund. CM did speak with Daphne in regards to possible 90 day supply instead of 30 day supply. Unsure if Fund can be allowed to do that much. Pt is set up with [facility]- if unable to do a 90 day supply refills need to be sent to this delivery pharmacy. No further needs from CM at this time.  01/23/2015 CM following for DCP; Patient does not qualify for the [facility] fund due to recently used the medication assistance program and patient's can only use the [facility] fund once a year. Will contact the Transitional Nurse at the [facility] and [facility] to see patient. ( pt refused for her to make her a follow up apt last admission).  Abelino Derrick [facility]  01/22/2015 Action/Plan: spoke w pt. She moved to [place] last ocotber from [place]. She has applied for [place] medicaid about 30days ago and has applied for disability. Gets meds del to home by [facility] and they are reasonable she states. Pablo Ledger RN   12/24/2014  Talked to patient about DCP; patient refused to have apt arranged for her to have a PCP, CM informed her of the importance of having a PCP for Medicaid/ Disability which she states that she is trying to get; patient stated that she would [facility]. Patient refused any HHC or DME at this time. Patient does not qualify for the Medication Assistance Fund because she has recently used it. B Sheana Bir RN,MHA,BSN  CM note from 11/05/2014 Talked to pt about follow up care.  Patient lives alone in apt, use a walker and a cab at times for transportation. Otherwise she does not come out of her home. Grocery is delivered to her home from [employer], she calls someone from her apt to take her trash out. Patient does not like follow up care and does not think that it is necessary for her health. CM informed the patient of the importance of follow up care and because of that there is a lapse in her care which is causing frequent hospitalizations. Patient finally agreed to go to the [facility] and Wellness program. Shanda Bumps with the [facility] at the [facility] contacted for an apt. CM encouraged patient to apply for Medicaid of [place] for medical insurance/ prescription drug coverage. Long discussion with patient about taking care of herself. CM again talked to the patient of the importance of having a PCP and that the hospital will take care of emergency issues and her PCP will manage her care outside of the hospital. Patient stated several times, " I took myself off of certain medication,". CM informed her that the problem is she is not to take herself off of her medication without a physicians order. Patient recently was discharged from [facility] and received the [facility] fund ( Medication Assistance Through [facility]) and she does not qualify for assistance again. She can only use the MATCH fund once a year. Patient  stated that she has all of her medication at home except Eliquis. Patient did not follow up with Eliquis medication assistance program after the 30 day free trail of medication.  CM note from 11/05/2014 Valentina Lucks RN - NCM aware of pt arrival. Pt has refused [facility] assistance. She has been MATCHed within last year and has not changed Medicaid from [place] to [place] as of this time. NCM will continue to follow until disposition determined. Camellia J. Lucretia Roers, RN, BSN, [employer] [phone]  CM note from 10/11/2014 - H Mayo RN - Appt was arranged by ED CM with [facility] and  [facility] - pt did not keep appt. Medications were also provided. Pt has not gone to DSS to change [place] Medicaid to [facility] - states she is sure she will be able to use [place] Medicaid to fill scripts. Per pt, she lived with her mother who had a reverse mortgage on her house and had to move out when her mother died - chose [place] because she has cousins in [place]. States she has a problem with transportation - explained that [place] Medicaid includes a transportation benefit and that most medication copays will be $3.00 or less.  Expected Discharge Date:    possibly 03/26/2015              Expected Discharge Plan:   home  In-House Referral:   Financial Counselor  Discharge planning Services   TBD  Status of Service:   In progress  [name] [phone] 03/24/2015, 2:22 PM
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
## 2015-03-24 NOTE — ED Notes (Signed)
Breakfast tray ordered. Pt used BSC without difficulty.

## 2015-03-25 DIAGNOSIS — I5043 Acute on chronic combined systolic (congestive) and diastolic (congestive) heart failure: Secondary | ICD-10-CM | POA: Diagnosis not present

## 2015-03-25 DIAGNOSIS — I4891 Unspecified atrial fibrillation: Secondary | ICD-10-CM | POA: Diagnosis not present

## 2015-03-25 LAB — BASIC METABOLIC PANEL
Anion gap: 9 (ref 5–15)
BUN: 14 mg/dL (ref 6–20)
CO2: 24 mmol/L (ref 22–32)
Calcium: 9.1 mg/dL (ref 8.9–10.3)
Chloride: 102 mmol/L (ref 101–111)
Creatinine, Ser: 1 mg/dL (ref 0.44–1.00)
GFR calc Af Amer: >60 mL/min (ref >60)
GFR calc non Af Amer: 58 mL/min — ABNORMAL LOW (ref >60)
Glucose, Bld: 299 mg/dL — ABNORMAL HIGH (ref 65–99)
Potassium: 4.1 mmol/L (ref 3.5–5.1)
Sodium: 135 mmol/L (ref 135–145)

## 2015-03-25 LAB — GLUCOSE, CAPILLARY
Glucose-Capillary: 205 mg/dL — ABNORMAL HIGH (ref 65–99)
Glucose-Capillary: 231 mg/dL — ABNORMAL HIGH (ref 65–99)
Glucose-Capillary: 105 mg/dL — ABNORMAL HIGH (ref 65–99)

## 2015-03-25 MED ORDER — INSULIN ASPART 100 UNIT/ML ~~LOC~~ SOLN: 0.0000 [IU] | Freq: Every day | SUBCUTANEOUS | Status: DC

## 2015-03-25 MED ORDER — FUROSEMIDE 10 MG/ML IJ SOLN: 80.0000 mg | Freq: Two times a day (BID) | INTRAMUSCULAR | Status: DC

## 2015-03-25 MED ORDER — FUROSEMIDE 10 MG/ML IJ SOLN
60.0000 mg | Freq: Two times a day (BID) | INTRAMUSCULAR | Status: DC
  Administered 2015-03-25 – 2015-03-27 (×4): 60 mg via INTRAVENOUS
  Filled 2015-03-25 (×5): qty 6

## 2015-03-25 MED ORDER — GLIPIZIDE 5 MG PO TABS
5.0000 mg | ORAL_TABLET | Freq: Two times a day (BID) | ORAL | Status: DC
  Administered 2015-03-25 – 2015-03-27 (×5): 5 mg via ORAL
  Filled 2015-03-25 (×7): qty 1

## 2015-03-25 MED ORDER — FUROSEMIDE 10 MG/ML IJ SOLN: 40.0000 mg | Freq: Two times a day (BID) | INTRAMUSCULAR | Status: DC

## 2015-03-25 MED ORDER — INSULIN ASPART 100 UNIT/ML ~~LOC~~ SOLN
0.0000 [IU] | Freq: Three times a day (TID) | SUBCUTANEOUS | Status: DC
  Administered 2015-03-25 (×2): 5 [IU] via SUBCUTANEOUS
  Administered 2015-03-26: 2 [IU] via SUBCUTANEOUS
  Administered 2015-03-26: 3 [IU] via SUBCUTANEOUS
  Administered 2015-03-26: 2 [IU] via SUBCUTANEOUS
  Administered 2015-03-27 (×2): 3 [IU] via SUBCUTANEOUS

## 2015-03-25 MED ORDER — INSULIN ASPART 100 UNIT/ML ~~LOC~~ SOLN: 0.0000 [IU] | Freq: Three times a day (TID) | SUBCUTANEOUS | Status: DC

## 2015-03-25 NOTE — Progress Notes (Addendum)
Inpatient Diabetes Program Recommendations  AACE/ADA: New Consensus Statement on Inpatient Glycemic Control (2015)  Target Ranges:  Prepandial:   less than 140 mg/dL      Peak postprandial:   less than 180 mg/dL (1-2 hours)      Critically ill patients:  140 - 180 mg/dL  Results for Kaitlyn [name], Kaitlyn [name] (MRN [id number]) as of 03/25/2015 08:46  Ref. Range 03/24/2015 05:45 03/25/2015 02:07  Glucose Latest Ref Range: 65-99 mg/dL 811 (H) 031 (H)   Review of Glycemic Control  Diabetes history: DM2 Outpatient Diabetes medications: None Current orders for Inpatient glycemic control: None  Inpatient Diabetes Program Recommendations: Correction (SSI): Please order CBGs with Novolog moderate correction ACHS. Oral Agents: A1C was 9.6%n on 01/22/15 and patient is not taking any DM medications as an outpatient. Patient will need affordable oral DM medication at time of discharge.   Note 03/25/15@12 :07-Spoke with patient about diabetes and home regimen for diabetes control. Patient reports that she is not taking any medication as an outpatient for diabetes control.  Patient states that she was on Glyburide in the past and that her doctor took her off of it because her "A1C was too low".  Patient was seen by S. Anne Hahn, RN, Inpatient Diabetes Coordinator on 01/23/15 (see note in chart for details). Patient reports that she continues to have an issue with ability to afford medications and with transportation to follow up visits. Patient reports that she has 4 cousins in [place] but the closest one lives in [place]. Discussed A1C results (9.6% on 01/22/15) and reviewed A1C goal of 7% or less.  Discussed importance of checking CBGs and maintaining good CBG control to prevent long-term and short-term complications. Discussed how hyperglycemia leads to damage within blood vessels which leads to complications commonly seen with uncontrolled diabetes. Patient reports that she has several family members with diabetes and she has seen  first hand what issues can happen with uncontrolled diabetes.  Discussed impact of nutrition, exercise, stress, sickness, and medications on diabetes control. Discussed Glipizide with patient and explained that the doctor has ordered it for her to start now.  Inquired about ability to afford Glipizide which is on the $4 list at [facility]. Patient reports that her pharmacy has affordable generic medications which are around $5 and they deliver her medications to her at home. Patient states that if she can get Glipizide for $5 she can afford to take the medication.  Patient states that she is going to be following up with the heart clinic and they are going to help her arrange transportation for her follow up visits. Encouraged patient to be sure she fills prescription for DM medication and takes it as prescribed. Patient states that she will take the DM medication as prescribed and that she does not want to add to her health problems by not taking care of her diabetes.  Patient verbalized understanding of information discussed and she states that she has no further questions at this time related to diabetes.   Thanks, Orlando Penner, RN, MSN, CDE Diabetes Coordinator Inpatient Diabetes Program [phone] (Team Pager from 8am to 5pm) [phone] ([facility] office) [phone] [facility] office) [phone] [facility] office)

## 2015-03-25 NOTE — Progress Notes (Signed)
OT Cancellation Note  Patient Details Name: Kaitlyn [name] MRN: [id number] DOB: 1950/09/22   Cancelled Treatment:    Reason Eval/Treat Not Completed: OT screened, no needs identified, will sign off. Spoke with pt and she has no OT concerns.  Earlie Raveling OTR/L [phone] 03/25/2015, 10:04 AM

## 2015-03-25 NOTE — Progress Notes (Signed)
Advanced Heart Failure Rounding Note  PCP: None Primary Cardiologist: Dr Shirlee Latch  Subjective:    Feels great this morning. Says she rescheduled her HF appointment in October to visit with family. Gets paid the last Wednesday of every month and sets aside money for [employer], and Food, with very little left for medication. States that she is working on [employer] and Social Security/Disability.  She was also taking her lasix 40 mg once a day at home. Has a scale but does not use it.  Out 3.4 L so far and down 4 lbs. Still up 10 lbs from previous discharge weight.   Objective:   Weight Range: 264 lb 5.4 oz (119.904 kg) Body mass index is 36.88 kg/(m^2).   Vital Signs:   Temp:  [97.8 F (36.6 C)-98.5 F (36.9 C)] 98.5 F (36.9 C) (11/08 0420) Pulse Rate:  [79-102] 79 (11/08 0420) Resp:  [20-21] 20 (11/08 0420) BP: (123-142)/(77-91) 138/91 mmHg (11/08 0420) SpO2:  [93 %-96 %] 93 % (11/08 0420) Weight:  [264 lb 5.4 oz (119.904 kg)-270 lb 14.4 oz (122.879 kg)] 264 lb 5.4 oz (119.904 kg) (11/08 0420) Last BM Date: 03/24/15  Weight change: Filed Weights   03/24/15 0957 03/24/15 1408 03/25/15 0420  Weight: 270 lb 14.4 oz (122.879 kg) 268 lb 12.8 oz (121.927 kg) 264 lb 5.4 oz (119.904 kg)    Intake/Output:   Intake/Output Summary (Last 24 hours) at 03/25/15 0727 Last data filed at 03/25/15 0600  Gross per 24 hour  Intake    720 ml  Output   4250 ml  Net  -3530 ml     Physical Exam: General: Well appearing. No resp difficulty HEENT: normal Neck: supple. JVP 12-13 cm. Carotids 2+ bilat; no bruits. No lymphadenopathy or thyromegaly. Cor: PMI nondisplaced. Mildly tachy, irregular rate & rhythm. No rubs, gallops or murmurs appreciated Lungs: CTA Abdomen: soft, NT, ND. No HSM. No bruits or masses. +BS Extremities: no cyanosis, clubbing, rash, 1+ edema into thighs Neuro: alert & orientedx3, cranial nerves grossly intact. moves all 4 extremities w/o difficulty. Affect  pleasant  Telemetry: Afib 70s  Labs: CBC  Recent Labs  03/24/15 0545  WBC 13.1*  NEUTROABS 10.3*  HGB 12.1  HCT 37.3  MCV 99.7  PLT 189   Basic Metabolic Panel  Recent Labs  03/24/15 0545 03/25/15 0207  NA 135 135  K 4.3 4.1  CL 104 102  CO2 21* 24  GLUCOSE 332* 299*  BUN 12 14  CREATININE 0.90 1.00  CALCIUM 8.9 9.1    BNP: BNP (last 3 results)  Recent Labs  01/22/15 0212 02/12/15 0757 03/24/15 1333  BNP 872.8* 1107.0* 665.2*    ProBNP (last 3 results)  Recent Labs  04/27/14 0128  PROBNP 5351.0*    Other results:   Imaging/Studies:   No results found.   Medications:     Scheduled Medications: . aspirin  325 mg Oral Daily  . digoxin  0.125 mg Oral Daily  . diltiazem  240 mg Oral Daily  . furosemide  40 mg Intravenous BID  . heparin  5,000 Units Subcutaneous 3 times per day  . lisinopril  10 mg Oral Daily  . metoprolol succinate  50 mg Oral BID  . potassium chloride SA  20 mEq Oral BID  . sodium chloride  3 mL Intravenous Q12H     Infusions: . diltiazem (CARDIZEM) infusion Stopped (03/24/15 1330)     PRN Medications:  sodium chloride, acetaminophen, ondansetron (ZOFRAN) IV, sodium chloride  Assessment/Plan  Mrs Heitmeyer has frequent readmissions with recurrent atrial fibrillation/RVR and acute on chronic systolic CHF. She remains noncompliant with meds at home and refuses to followup in the outpatient setting. She has never been seen for follow up in the HF clinic.   1. Acute on chronic systolic CHF: ECHO 10/10/14 EF 33-54%.  Volume status elevated in the setting on medication noncompliance.  - Increase lasix to 60 mg BID, this is the highest dose she will agree to. Supp K as needed. - Continue home dose of digoxin, Toprol XL, lisinopril. 2. Atrial fibrillation: Mild RVR initially.  - Rate has improved. Continue po cardizem - She is not candidate for anticoagulation due to noncompliance.  3. Noncompliance:  -  Marked noncompliance, will not be seen in the outpatient setting and takes medications as she thinks they should be taken.Rescheduled her last follow up, and has many previous no shows. - High risk for future admission.  - Continues to refuse paramedicine and HH. States she will likely not follow up as an outpatient.  Length of Stay: 1  Graciella Freer PA-C 03/25/2015, 7:27 AM  Advanced Heart Failure Team Pager [phone] (M-F; 7a - 4p)  Please contact [facility] for night-coverage after hours (4p -7a ) and weekends on [email or web address]  Patient seen with PA, agree with the above note.    Patient was admitted again due to medication noncompliance (ran out of meds and did not call).  She still will not come to outpatient appointments.  She feels much better today, diuresed well.  Still some volume overload on exam.   - HR is controlled in atrial fibrillation, continue current regimen. She has refused anticoagulation and would be poor candidate regardless because of compliance issues. - Lasix to 60 mg IV bid.  - Needs treatment of diabetes => sliding scale insulin and start glipizide.  Avoid metformin with decompensated CHF.  Consult diabetes coordinator.  - Will try again with social work to arrange appointments, etc.   [name] 03/25/2015 8:58 AM

## 2015-03-25 NOTE — Evaluation (Signed)
Physical Therapy Evaluation Patient Details Name: Kaitlyn [name] MRN: [id number] DOB: 08-Mar-1951 Today's Date: 03/25/2015   History of Present Illness  Kaitlyn [name] is a 64 y.o. female with a history of chronic afib, CHF (EF 20-25%), DM, obesity, noncompliance with medical treatment including 13 ER visits and 6 hospital admissions for CHF exacerbation 2/2 noncompliance in the last 6 months who presented last month with recurrent shortness of breath and was found to be in afib with RVR.  Clinical Impression  Pt very resistant to working with PT and extremely particular. Did observe pt ambulating to door with RW. Pt with noted generalized weakness and noted inability to care for self as pt has had 20 admissions in the last 6 month. Recommend HHPT however pt refusing reporting "I know what I can do and how to do it." 'I don't ever leave my house." acute PT to attempt to con't to follow to improve safety with mobility and increased activity tolerance.    Follow Up Recommendations Supervision/Assistance - 24 hour;Home health PT(pt refusing)    Equipment Recommendations   (rollator)    Recommendations for Other Services       Precautions / Restrictions Precautions Precautions: Fall Restrictions Weight Bearing Restrictions: No      Mobility  Bed Mobility Overal bed mobility: Modified Independent             General bed mobility comments: HOB elevated, use of bed rails  Transfers Overall transfer level: Modified independent Equipment used: Rolling walker (2 wheeled)             General transfer comment: pt refused for PT to adjust walker to appropriate height, but otherwise demo'd safe transfer technique  Ambulation/Gait Ambulation/Gait assistance: Supervision Ambulation Distance (Feet): 20 Feet Assistive device: Rolling walker (2 wheeled) Gait Pattern/deviations: Step-through pattern;Decreased stride length;Trunk flexed Gait velocity: decreased Gait  velocity interpretation: Below normal speed for age/gender General Gait Details: pt refused to ambulate in hallway despite max enocuragement. Pt only willing to amb in room. pt with mild SOB but no episodes of LOB  Stairs            Wheelchair Mobility    Modified Rankin (Stroke Patients Only)       Balance Overall balance assessment:  (requires UE support in standing, pt reaching for objects )                                           Pertinent Vitals/Pain Pain Assessment: No/denies pain    Home Living Family/patient expects to be discharged to:: Private residence Living Arrangements: Alone Available Help at Discharge:  (no family or friends in the area) Type of Home: Apartment Home Access: Level entry     Home Layout: One level Home Equipment: Walker - 2 wheels      Prior Function Level of Independence: Independent with assistive device(s);Needs assistance   Gait / Transfers Assistance Needed: ambulates with RW with modif I; states she uses tub and sink to help get up from toilet  ADL's / Homemaking Assistance Needed: sponge bathes, HT delivers her groceries.          Hand Dominance   Dominant Hand: Right    Extremity/Trunk Assessment   Upper Extremity Assessment: Overall WFL for tasks assessed           Lower Extremity Assessment: Overall WFL for tasks assessed  Cervical / Trunk Assessment: Normal  Communication   Communication: No difficulties  Cognition Arousal/Alertness: Awake/alert Behavior During Therapy: WFL for tasks assessed/performed Overall Cognitive Status: Within Functional Limits for tasks assessed                      General Comments General comments (skin integrity, edema, etc.): pt extremely particular and verbally labile telling prior life in [place] and about mothers death    Exercises        Assessment/Plan    PT Assessment Patient needs continued PT services (however believe she will  refuse)  PT Diagnosis Generalized weakness   PT Problem List Decreased strength;Decreased activity tolerance;Decreased balance;Decreased mobility  PT Treatment Interventions DME instruction;Gait training;Functional mobility training;Therapeutic activities;Therapeutic exercise;Balance training   PT Goals (Current goals can be found in the Care Plan section) Acute Rehab PT Goals Patient Stated Goal: home PT Goal Formulation: With patient Time For Goal Achievement: 04/01/15 Potential to Achieve Goals: Good    Frequency Min 2X/week   Barriers to discharge Decreased caregiver support lives alone, however never needs to leave home    Co-evaluation               End of Session   Activity Tolerance: Patient tolerated treatment well Patient left: in bed;with call bell/phone within reach Nurse Communication: Mobility status         Time: 1345-1403 PT Time Calculation (min) (ACUTE ONLY): 18 min   Charges:   PT Evaluation $Initial PT Evaluation Tier I: 1 Procedure     PT G CodesMarcene Brawn 03/25/2015, 2:26 PM   Lewis Shock, PT, DPT Pager #: [phone] Office #: [phone]

## 2015-03-26 DIAGNOSIS — I4891 Unspecified atrial fibrillation: Secondary | ICD-10-CM | POA: Diagnosis not present

## 2015-03-26 DIAGNOSIS — I5043 Acute on chronic combined systolic (congestive) and diastolic (congestive) heart failure: Secondary | ICD-10-CM | POA: Diagnosis not present

## 2015-03-26 LAB — BASIC METABOLIC PANEL
Anion gap: 8 (ref 5–15)
BUN: 17 mg/dL (ref 6–20)
CO2: 30 mmol/L (ref 22–32)
Calcium: 9.4 mg/dL (ref 8.9–10.3)
Chloride: 100 mmol/L — ABNORMAL LOW (ref 101–111)
Creatinine, Ser: 1.08 mg/dL — ABNORMAL HIGH (ref 0.44–1.00)
GFR calc Af Amer: >60 mL/min (ref >60)
GFR calc non Af Amer: 53 mL/min — ABNORMAL LOW (ref >60)
Glucose, Bld: 171 mg/dL — ABNORMAL HIGH (ref 65–99)
Potassium: 3.8 mmol/L (ref 3.5–5.1)
Sodium: 138 mmol/L (ref 135–145)

## 2015-03-26 LAB — GLUCOSE, CAPILLARY
Glucose-Capillary: 166 mg/dL — ABNORMAL HIGH (ref 65–99)
Glucose-Capillary: 142 mg/dL — ABNORMAL HIGH (ref 65–99)
Glucose-Capillary: 148 mg/dL — ABNORMAL HIGH (ref 65–99)
Glucose-Capillary: 164 mg/dL — ABNORMAL HIGH (ref 65–99)

## 2015-03-26 LAB — HEMOGLOBIN A1C
Hgb A1c MFr Bld: 8.7 % — ABNORMAL HIGH (ref 4.8–5.6)
Mean Plasma Glucose: 203 mg/dL

## 2015-03-26 MED ORDER — POTASSIUM CHLORIDE CRYS ER 20 MEQ PO TBCR
20.0000 meq | EXTENDED_RELEASE_TABLET | Freq: Once | ORAL | Status: AC
  Administered 2015-03-26: 20 meq via ORAL
  Filled 2015-03-26: qty 1

## 2015-03-26 MED ORDER — ASPIRIN EC 325 MG PO TBEC
325.0000 mg | DELAYED_RELEASE_TABLET | Freq: Every day | ORAL | Status: DC
Start: 2015-03-26 — End: 2015-03-27
  Administered 2015-03-26 – 2015-03-27 (×2): 325 mg via ORAL
  Filled 2015-03-26 (×2): qty 1

## 2015-03-26 NOTE — Progress Notes (Signed)
Advanced Heart Failure Rounding Note  PCP: None Primary Cardiologist: Dr Shirlee Latch  Subjective:    Feels good this am.  Has stated that she will come to her follow up and asks for help getting her prescriptions.   Out 7.2 L yesterday and this am. Down 8 lbs overnight. Near previous discharge weight but remains overloaded.   Objective:   Weight Range: 256 lb 4.8 oz (116.257 kg) Body mass index is 35.76 kg/(m^2).   Vital Signs:   Temp:  [97.7 F (36.5 C)-98.5 F (36.9 C)] 97.7 F (36.5 C) (11/09 0523) Pulse Rate:  [59-93] 77 (11/09 0523) Resp:  [20] 20 (11/09 0523) BP: (112-127)/(55-98) 127/55 mmHg (11/09 0523) SpO2:  [93 %-98 %] 98 % (11/09 0523) Weight:  [256 lb 4.8 oz (116.257 kg)] 256 lb 4.8 oz (116.257 kg) (11/09 0523) Last BM Date: 03/25/15  Weight change: Filed Weights   03/24/15 1408 03/25/15 0420 03/26/15 0523  Weight: 268 lb 12.8 oz (121.927 kg) 264 lb 5.4 oz (119.904 kg) 256 lb 4.8 oz (116.257 kg)    Intake/Output:   Intake/Output Summary (Last 24 hours) at 03/26/15 0734 Last data filed at 03/26/15 0600  Gross per 24 hour  Intake   1740 ml  Output   7550 ml  Net  -5810 ml     Physical Exam: General: Well appearing. NAD HEENT: normal Neck: supple. JVP 10 cm. Carotids 2+ bilat; no bruits. No thyromegaly or nodule noted Cor: PMI nondisplaced. Irregular rate& rhythm. No rubs, gallops or murmurs Lungs: CTA Abdomen: soft, nontender, nondistended. No HSM. No bruits or masses. +BS Extremities: no cyanosis, clubbing, rash, 1+ pretibial edema Neuro: alert & orientedx3, cranial nerves grossly intact. moves all 4 extremities w/o difficulty. Affect pleasant  Telemetry: Afib 80-90s  Labs: CBC  Recent Labs  03/24/15 0545  WBC 13.1*  NEUTROABS 10.3*  HGB 12.1  HCT 37.3  MCV 99.7  PLT 189   Basic Metabolic Panel  Recent Labs  03/25/15 0207 03/26/15 0323  NA 135 138  K 4.1 3.8  CL 102 100*  CO2 24 30  GLUCOSE 299* 171*  BUN 14 17  CREATININE  1.00 1.08*  CALCIUM 9.1 9.4   BNP: BNP (last 3 results)  Recent Labs  01/22/15 0212 02/12/15 0757 03/24/15 1333  BNP 872.8* 1107.0* 665.2*   ProBNP (last 3 results)  Recent Labs  04/27/14 0128  PROBNP 5351.0*   Other results:  Imaging/Studies:  No results found.   Medications:     Scheduled Medications: . aspirin  325 mg Oral Daily  . digoxin  0.125 mg Oral Daily  . diltiazem  240 mg Oral Daily  . furosemide  60 mg Intravenous BID  . glipiZIDE  5 mg Oral BID AC  . heparin  5,000 Units Subcutaneous 3 times per day  . insulin aspart  0-15 Units Subcutaneous TID WC  . insulin aspart  0-5 Units Subcutaneous QHS  . lisinopril  10 mg Oral Daily  . metoprolol succinate  50 mg Oral BID  . potassium chloride SA  20 mEq Oral BID  . sodium chloride  3 mL Intravenous Q12H    Infusions: . diltiazem (CARDIZEM) infusion Stopped (03/24/15 1330)   PRN Medications: sodium chloride, acetaminophen, ondansetron (ZOFRAN) IV, sodium chloride  Assessment/Plan  Kaitlyn [name] has frequent readmissions with recurrent atrial fibrillation/RVR and acute on chronic systolic CHF. She remains noncompliant with meds at home and refuses to followup in the outpatient setting. She has never been seen for follow up  in the HF clinic.   1. Acute on chronic systolic CHF: ECHO 10/10/14 EF 60-60%.  - Volume status remains elevated, but improving, in the setting on medication noncompliance.  - Continue lasix 60 mg BID for today, this is the highest dose she will agree to. Will give extra K this am. - Continue home dose of digoxin, Toprol XL, lisinopril. 2. Atrial fibrillation: Mild RVR initially.  - Rate has improved. Continue diltiazem CD.  This is not an ideal medication with low EF but she insists that it is "the only thing that works" and it seems to enable compliance with the rest of her medication program.  - She is not candidate for anticoagulation => cannot afford DOACs and would not be  compliant with warfarin INR checks.  3. Diabetes - Started glipizide.  - Diabetes coordinator following. 4. Noncompliance:  - Marked noncompliance.Rescheduled her last follow up, and has many previous no shows. States she is working on Health visitor. - High risk for future admission.  - Continues to refuse paramedicine and HH.  - States that she will follow up in the HF clinic this time. - PT recommends [facility] PT but pt refuses.  Length of Stay: 2  Kaitlyn Freer PA-C 03/26/2015, 7:34 AM  Advanced Heart Failure Team Pager [phone] (M-F; 7a - 4p)  Please contact [facility] for night-coverage after hours (4p -7a ) and weekends on [email or web address]  Patient seen with PA, agree with the above note.  She is doing better symptomatically.  Weight is down 8 lbs over the last day.  Still has some volume overload on exam.  - Continue once more day of IV Lasix, switch to po tomorrow.  - Continue other meds as ordered.  - The issue of compliance remains very difficult.  She refuses home health, home PT, paramedicine visits.  She has not come to any outpatient followups despite the provision of transportation.  She does not refill her meds after the initial month is taken.  Finances are part of the issue.  Need social work to help her work on disability and [place] Medicaid.  When she goes home, she will need multiple refills on all her prescriptions.  Kaitlyn [name] 03/26/2015 8:08 AM

## 2015-03-26 NOTE — Progress Notes (Signed)
Talked to patient about DCP. Patient lives alone and is not keeping her apt. Patient stated " Im just being lazy but I am going to keep my apt and when they sent a ride to get me, I will go." Patient stated that since her mother has died she has been having difficulty making decisions. CM offered to send a HHRN to her home that has Tella communication so that they would not be in the home often but can communicate with her to make sure that she is compliant; patient refused. CM instructed patient to follow through with her Medicaid. In December she will be 63 yrs of age; CM informed patient that she still needed a drug plan to cover the cost of her medication. Lots of encouragement given; Patient does not qualify for the [facility] fund for medication assistance because she used it in May 2016 and she can only use it once a year. Abelino Derrick [facility] [phone]

## 2015-03-26 NOTE — Progress Notes (Signed)
I called and spoke with [employer] [place].  He tells me that a SW will be in contact with Kaitlyn [name] to do a "home assessment" to see if she qualifies for meal delivery.  I will inform Kaitlyn [name] of this plan in the AM.  I do have concern that she will not allow SW in her home as she has refused to allow others into her home on previous occasions. (Paramedicine and HHRN).

## 2015-03-26 NOTE — Progress Notes (Addendum)
I stopped in with Lasandra Beech LCSW at the request of Dr. Shirlee Latch to speak with patient again regarding outpatient follow-up and medication assistance.  She does have documentation that she will have Social Security to be effective as of April 17, 2015.  Annice Pih will return in AM to assist patient with [facility] Extra Help Application and Part D Application.  I will also investigate Meals on Wheels as a possible assistance to patient in regards to financial constraints and food concerns.  She also expresses some concerns about getting her medications at time of discharge --which according to her may take place tomorrow.  As it seems that she may have medication coverage beginning next month --I will again fill her medications at the time of discharge through the [facility] Fund.  She insures me that she plans to come to [facility]HF Clinic for follow-up appt on 04/04/15 at 2:40pm.  We will also provide taxi transportation to and from appt.

## 2015-03-26 NOTE — Progress Notes (Signed)
Pt refused evening dose of IV lasix. Explained to patient the importance of taking the lasix, but patient still refused. Stated that she is tired of getting up to use the bathroom.

## 2015-03-27 DIAGNOSIS — I4891 Unspecified atrial fibrillation: Secondary | ICD-10-CM | POA: Diagnosis not present

## 2015-03-27 DIAGNOSIS — I5043 Acute on chronic combined systolic (congestive) and diastolic (congestive) heart failure: Secondary | ICD-10-CM | POA: Diagnosis not present

## 2015-03-27 LAB — GLUCOSE, CAPILLARY
Glucose-Capillary: 193 mg/dL — ABNORMAL HIGH (ref 65–99)
Glucose-Capillary: 115 mg/dL — ABNORMAL HIGH (ref 65–99)
Glucose-Capillary: 178 mg/dL — ABNORMAL HIGH (ref 65–99)

## 2015-03-27 LAB — BASIC METABOLIC PANEL
Anion gap: 9 (ref 5–15)
BUN: 21 mg/dL — ABNORMAL HIGH (ref 6–20)
CO2: 26 mmol/L (ref 22–32)
Calcium: 8.8 mg/dL — ABNORMAL LOW (ref 8.9–10.3)
Chloride: 99 mmol/L — ABNORMAL LOW (ref 101–111)
Creatinine, Ser: 1.01 mg/dL — ABNORMAL HIGH (ref 0.44–1.00)
GFR calc Af Amer: >60 mL/min (ref >60)
GFR calc non Af Amer: 58 mL/min — ABNORMAL LOW (ref >60)
Glucose, Bld: 197 mg/dL — ABNORMAL HIGH (ref 65–99)
Potassium: 4.2 mmol/L (ref 3.5–5.1)
Sodium: 134 mmol/L — ABNORMAL LOW (ref 135–145)

## 2015-03-27 MED ORDER — METOPROLOL SUCCINATE ER 50 MG PO TB24: 50.0000 mg | ORAL_TABLET | Freq: Two times a day (BID) | ORAL | Status: DC

## 2015-03-27 MED ORDER — DIGOXIN 125 MCG PO TABS: 0.1250 mg | ORAL_TABLET | Freq: Every day | ORAL | Status: DC

## 2015-03-27 MED ORDER — POTASSIUM CHLORIDE CRYS ER 20 MEQ PO TBCR: 20.0000 meq | EXTENDED_RELEASE_TABLET | Freq: Two times a day (BID) | ORAL | Status: DC

## 2015-03-27 MED ORDER — DILTIAZEM HCL ER COATED BEADS 240 MG PO CP24: 240.0000 mg | ORAL_CAPSULE | Freq: Every day | ORAL | Status: DC

## 2015-03-27 MED ORDER — LISINOPRIL 10 MG PO TABS
10.0000 mg | ORAL_TABLET | Freq: Every day | ORAL | Status: DC
Start: 2015-03-27 — End: 2015-07-08

## 2015-03-27 MED ORDER — ASPIRIN 325 MG PO TABS: 325.0000 mg | ORAL_TABLET | Freq: Every day | ORAL | Status: DC

## 2015-03-27 MED ORDER — FUROSEMIDE 20 MG PO TABS: 60.0000 mg | ORAL_TABLET | Freq: Two times a day (BID) | ORAL | Status: DC

## 2015-03-27 MED ORDER — GLIPIZIDE 5 MG PO TABS: 5.0000 mg | ORAL_TABLET | Freq: Two times a day (BID) | ORAL | Status: DC

## 2015-03-27 NOTE — Progress Notes (Signed)
Utilization review completed. Meggan Dhaliwal, RN, BSN. 

## 2015-03-27 NOTE — Progress Notes (Signed)
Pt has discharge orders in pior to MD. BSW intern notified Pt about discharge. BSW intern also notified nurse in charge of Pt. BSW intern requested for [facility]AR to pick Pt up from the  hospital and transport her back home. Pt states she has her key to get into her home. BSW intern will sign off...  Catheryn Bacon  BSW intern  [phone]

## 2015-03-27 NOTE — Progress Notes (Signed)
I have picked-up and provided 90 day supply of medications for Kaitlyn [name] from the [facility].  She says that she will come for her follow-up appt scheduled in the AHF Clinic on November 18th at 2:40pm.  She also has an appt on 11/23 at 1130 with [facility] in order to establish a PCP.   I will call her prior to  Her AHF Clinicappt and set up taxi transportation.  I have also informed her that the SW from [facility] will be in contact with her to set up an in-home evaluation to assess for meal delivery.

## 2015-03-27 NOTE — Progress Notes (Signed)
Advanced Heart Failure Rounding Note  PCP: None Primary Cardiologist: Dr Shirlee Latch  Subjective:    Says she has been here 4 days and has felt great 3 of those days.  Refused evening lasix because she didn't want to get up and pee anymore.   Says she takes and will take her diuretics as directed at home.  Willing to stay into afternoon to get social situation sorted out, but insistent on leaving today.   Out 3.5 L yesterday, refused evening lasix. Down 2 lbs. Near previous discharge weight but remains overloaded.   Objective:   Weight Range: 254 lb 9.6 oz (115.486 kg) Body mass index is 35.53 kg/(m^2).   Vital Signs:   Temp:  [97.5 F (36.4 C)-98.1 F (36.7 C)] 97.5 F (36.4 C) (11/10 0411) Pulse Rate:  [78-96] 78 (11/10 0411) Resp:  [18-20] 18 (11/10 0411) BP: (99-114)/(62-69) 99/68 mmHg (11/10 0411) SpO2:  [97 %] 97 % (11/10 0411) Weight:  [254 lb 9.6 oz (115.486 kg)] 254 lb 9.6 oz (115.486 kg) (11/10 0411) Last BM Date: 03/26/15  Weight change: Filed Weights   03/25/15 0420 03/26/15 0523 03/27/15 0411  Weight: 264 lb 5.4 oz (119.904 kg) 256 lb 4.8 oz (116.257 kg) 254 lb 9.6 oz (115.486 kg)    Intake/Output:   Intake/Output Summary (Last 24 hours) at 03/27/15 0730 Last data filed at 03/27/15 0400  Gross per 24 hour  Intake    800 ml  Output   3601 ml  Net  -2801 ml     Physical Exam: General: Well appearing. NAD HEENT: normal Neck: supple. JVP 10 cm. Carotids 2+ bilat; no bruits. No thyromegaly or lymphadenopathy. Cor: PMI nondisplaced. Irregularly irregular. No M/G/R Lungs: clear, normal effort Abdomen: soft, NT, ND, No HSM. No bruits or masses. +BS Extremities: no cyanosis, clubbing, rash, 1-2+ pretibial edema Neuro: alert & orientedx3, cranial nerves grossly intact. moves all 4 extremities w/o difficulty. Affect pleasant  Telemetry: Afib 70-80s  Labs: CBC No results for input(s): WBC, NEUTROABS, HGB, HCT, MCV, PLT in the last 72 hours. Basic Metabolic  Panel  Recent Labs  96/04/54 0323 03/27/15 0351  NA 138 134*  K 3.8 4.2  CL 100* 99*  CO2 30 26  GLUCOSE 171* 197*  BUN 17 21*  CREATININE 1.08* 1.01*  CALCIUM 9.4 8.8*   BNP: BNP (last 3 results)  Recent Labs  01/22/15 0212 02/12/15 0757 03/24/15 1333  BNP 872.8* 1107.0* 665.2*   ProBNP (last 3 results)  Recent Labs  04/27/14 0128  PROBNP 5351.0*   Other results:  Imaging/Studies:  No results found.   Medications:     Scheduled Medications: . aspirin EC  325 mg Oral Daily  . digoxin  0.125 mg Oral Daily  . diltiazem  240 mg Oral Daily  . furosemide  60 mg Intravenous BID  . glipiZIDE  5 mg Oral BID AC  . heparin  5,000 Units Subcutaneous 3 times per day  . insulin aspart  0-15 Units Subcutaneous TID WC  . insulin aspart  0-5 Units Subcutaneous QHS  . lisinopril  10 mg Oral Daily  . metoprolol succinate  50 mg Oral BID  . potassium chloride SA  20 mEq Oral BID  . sodium chloride  3 mL Intravenous Q12H    Infusions:   PRN Medications: sodium chloride, acetaminophen, ondansetron (ZOFRAN) IV, sodium chloride  Assessment/Plan  Kaitlyn [name] has frequent readmissions with recurrent atrial fibrillation/RVR and acute on chronic systolic CHF. She remains noncompliant with meds at  home and refuses to followup in the outpatient setting. She has never been seen for follow up in the HF clinic.   1. Acute on chronic systolic CHF: ECHO 10/10/14 EF 57-90%.  - She remains volume overloaded, still about 5 lbs up from baseline but is refusing to stay past today.  - Will give 60 mg IV lasix this am and then can transition to po for home if still in house this evening. - Continue home dose of digoxin, Toprol XL, lisinopril. 2. Atrial fibrillation: Mild RVR initially.  - Rate has improved. Continue diltiazem CD.  This is not an ideal medication with low EF but she insists that it is "the only thing that works" and it seems to enable compliance with the rest of her  medication program.  - She is not candidate for anticoagulation => cannot afford DOACs and would not be compliant with warfarin INR checks.When she has Medicare drug coverage, will see if we can get her on a DOAC.  3. Diabetes - Started glipizide.  - Diabetes coordinator following. 4. Noncompliance:  - Marked noncompliance.Rescheduled her last follow up, and has many previous no shows.  - Should be getting Social Security and Medicare beginning 04/17/2015 - High risk for future admission.  - [employer] [place] to do home assessment to see if pt qualifies for home food assistance. - Continues to refuse paramedicine and HH.  - States that she will follow up in the HF clinic this time. - PT recommends [facility] PT but pt refuses.  Length of Stay: 3  Graciella Freer PA-C 03/27/2015, 7:30 AM  Advanced Heart Failure Team Pager [phone] (M-F; 7a - 4p)  Please contact [facility] for night-coverage after hours (4p -7a ) and weekends on [email or web address]  Patient seen with PA, agree with the above note.  Volume status looks considerably better.  Think it would be reasonable to give her one more dose of IV Lasix this morning then send her home this afternoon.  HR is controlled, she feels much better.    Once she has Medicare drug coverage, perhaps we can get her on a DOAC.   Care coordination is going to be difficult.  We will try to arrange for a 3 month supply of her medication.  She has an appointment with CHF clinic on 11/18.  We will work on arranging taxi transportation.  She seems more inclined to come to the appointment.  She is also going to be able to get on Medicare, will need to make sure she has drug coverage.  Finally, she needs PCP to care for her diabetes => will see if we can work on this as well prior to discharge.   Home meds: digoxin 0.125 daily, ASA 325 daily, diltiazem CD 240 daily, Toprol XL 50 bid, lisinopril 10 mg daily, KCl 20 bid, Lasix 60 mg po bid,  glipizide 10 mg bid.   [name] 03/27/2015 9:08 AM

## 2015-03-27 NOTE — Discharge Summary (Signed)
Advanced Heart Failure Team  Discharge Summary   Patient ID: Kaitlyn [name] MRN: [id number], DOB/AGE: 65/08/1950 65 y.o. Admit date: 03/24/2015 D/C date:     03/27/2015   Primary Discharge Diagnoses:  1. Acute on chronic systolic CHF: Echo 10/10/14 LVEF 20-25% - Improved with IV lasix and resumption of meds 2. A fib with RVR - Improved with short term IV cardizem and resumption of po meds 3. Diabetes - started on glipizide 4. Noncompliance - Worked with SW and HF nurse navigator to get meds, set up for possible meal assistance in home, and Medicare/Social security  Hospital Course:  Kaitlyn [name] is a 64 y.o. female with a history of chronic afib, CHF (EF 20-25%), DM, obesity, noncompliance with medical treatment including 7 ER visits and 7 hospital admissions for CHF exacerbation 2/2 noncompliance in the last 6 months who presented again with shortness of breath and was found to be in afib with RVR. She had ran out of her medication. She was given 1 month from the HF fund and needed to fill her prescriptions past that through her pharmacy. She was aware of this, but states she did not have the money, and did not call the HF clinic for further assistance, as well as rescheduling her outpatient follow up.  She was admitted for IV diuresis and further assistance with social status.  She was placed on short term IV diltiazem but her rate rapidly improved and she was transitioned to po within several hours.  She diuresed well with IV lasix, but would only accept doses up to 60 mg BID. K supped as needed.    Primary issue has been noncompliance with medications and follow up. Pt will have medicare effective Dec 1, so should be able to afford her medications. She also states she will comply with outpatient follow up, which she has adamantly refused in the past. She was given 3 months of medications through the HF fund.   Overall she diuresed 13.6L and 16 lbs on IV lasix up to 60 mg BID. She will be  discharged to home in stable condition with 3 months of her medications and close follow up in the HF clinic as below.   Discharge Weight Range: 254 lbs Discharge Vitals: Blood pressure 118/80, pulse 87, temperature 97.6 F (36.4 C), temperature source Oral, resp. rate 18, height 5\' 11"  (1.803 m), weight 254 lb 9.6 oz (115.486 kg), SpO2 97 %.  Labs: Lab Results  Component Value Date   WBC 13.1* 03/24/2015   HGB 12.1 03/24/2015   HCT 37.3 03/24/2015   MCV 99.7 03/24/2015   PLT 189 03/24/2015     Recent Labs Lab 03/27/15 0351  NA 134*  K 4.2  CL 99*  CO2 26  BUN 21*  CREATININE 1.01*  CALCIUM 8.8*  GLUCOSE 197*   Lab Results  Component Value Date   CHOL 111 10/10/2014   HDL 30* 10/10/2014   LDLCALC 63 10/10/2014   TRIG 91 10/10/2014   BNP (last 3 results)  Recent Labs  01/22/15 0212 02/12/15 0757 03/24/15 1333  BNP 872.8* 1107.0* 665.2*    ProBNP (last 3 results)  Recent Labs  04/27/14 0128  PROBNP 5351.0*     Diagnostic Studies/Procedures   No results found.  Discharge Medications     Medication List    TAKE these medications        aspirin 325 MG tablet  Take 1 tablet (325 mg total) by mouth daily.     digoxin 0.125  MG tablet  Commonly known as:  LANOXIN  Take 1 tablet (0.125 mg total) by mouth daily.     diltiazem 240 MG 24 hr capsule  Commonly known as:  CARDIZEM CD  Take 1 capsule (240 mg total) by mouth daily.     diphenhydrAMINE 25 MG tablet  Commonly known as:  BENADRYL  Take 1 tablet (25 mg total) by mouth every 8 (eight) hours as needed for itching or allergies.     furosemide 20 MG tablet  Commonly known as:  LASIX  Take 3 tablets (60 mg total) by mouth 2 (two) times daily.     glipiZIDE 5 MG tablet  Commonly known as:  GLUCOTROL  Take 1 tablet (5 mg total) by mouth 2 (two) times daily before a meal.     lisinopril 10 MG tablet  Commonly known as:  PRINIVIL,ZESTRIL  Take 1 tablet (10 mg total) by mouth daily.      metoprolol succinate 50 MG 24 hr tablet  Commonly known as:  TOPROL-XL  Take 1 tablet (50 mg total) by mouth 2 (two) times daily. Take with or immediately following a meal.     potassium chloride SA 20 MEQ tablet  Commonly known as:  K-DUR,KLOR-CON  Take 1 tablet (20 mEq total) by mouth 2 (two) times daily.        Disposition   The patient will be discharged in stable condition to home. Discharge Instructions    Diet - low sodium heart healthy    Complete by:  As directed      Heart Failure patients record your daily weight using the same scale at the same time of day    Complete by:  As directed      Increase activity slowly    Complete by:  As directed           Follow-up Information    Follow up with [facility] On 04/09/2015.   Why:  @ 11:30     .... confirmed w/ Lafonda Mosses information:   [address] [place] [address] [phone]      Follow up with [name], MD On 04/04/2015.   Specialty:  Cardiology   Why:  at 240 for post hospital follow up. Please bring all of your medications with you to your visit.   Contact information:   [address] [place] [address] [phone]         Duration of Discharge Encounter: Greater than 35 minutes   Signed, Graciella Freer PA-C 03/27/2015, 1:30 PM

## 2015-03-27 NOTE — Hospital Discharge Follow-Up (Signed)
[facility]:  This Case Manager was asked by Olga Coaster, RN CM to meet with patient to discuss outpatient follow-up since patient does not have a PCP.  Patient has had 7 inpatient admissions and 7 ED visits in the last 6 months. Met with patient at bedside to provide information about [facility]. Patient indicated she did not need to obtain a PCP because she plans to follow-up with the Heart Failure Clinic on 04/04/15.  Discussed importance of obtaining a PCP for management of other conditions such as diabetes; however, patient refused. She indicated she had "too many doctors, too many appointments." Hospital follow-up appointment on 04/09/15 cancelled since patient refused appointment.  Provided patient with contact information for [facility] in case she decided to make an appointment to establish care with a PCP in the future. Patient appreciative of information.  Olga Coaster, RN CM and Carole Binning, Heart Failure Nurse Navigator updated.

## 2015-03-27 NOTE — Progress Notes (Signed)
Patient discharge to home with two EMS personnel via ambulance. Discharge instructions given. Patient verbalizes understanding. Medications given. Denies any pain. No signs and symptoms of distress noted. Telemetry box and IV removed prior to discharge.

## 2015-03-27 NOTE — Progress Notes (Signed)
CSW requested to see patient by Dr. Aundra Dubin. Patient well known to this CSW from the HF clinic. Patient has been unsuccessful with follow up clinic appointments, medications and overall medical compliance. CSW met with patient at bedside to discuss medical follow up and how to obtain medications going forward. During conversation with patient it became apparent that she has recently received verification from Medicare that she will be eligible for Medicare A & B on April 17, 2015. CSW assisted patient with contacting SHIP to explore eligibility for Extra Help program and Medicare Savings Plan. Applications completed for both programs and patient will receive further follow up via mail within the next 4-6 weeks. SHIP counselor stated she should return call after confirmation of Extra Help and sign up for Medicare D plan. Patient verbalizes understanding of follow up needed and has follow up appointment with HF clinic on Novemberr 18, 2016. HF clinic staff have provided needed medications and will arrange cab fare to clinic appointment next week. Patient appears relieved and grateful for assistance and states she will follow up with clinic appointment next week. CSW continues to follow and provide support to patient. Raquel Sarna, [place]

## 2015-03-31 ENCOUNTER — Telehealth (HOSPITAL_COMMUNITY): Payer: Self-pay | Admitting: Surgery

## 2015-03-31 NOTE — Telephone Encounter (Signed)
Heart Failure Nurse Navigator Post Discharge Telephone Call   I called along with Lasandra Beech LCSW  to check on Kaitlyn [name] after her recent hospitalization.  She says that she is "doing fantastic and is feeling amazing".  She was very grateful to have all of her medications and remains positive that she will come to her outpatient follow-up scheduled on 04/04/15 in the AHF Clinic.  She says that her finances are "ok" and that she has enough food to last until she gets "paid" next week on 04/09/15.  I will plan to call on Wednesday or Thursday of this week to remind her of appt and arrange taxi transportation.

## 2015-04-03 ENCOUNTER — Telehealth (HOSPITAL_COMMUNITY): Payer: Self-pay | Admitting: Surgery

## 2015-04-03 NOTE — Telephone Encounter (Signed)
I called to speak with Kaitlyn [name] to verify appointment time with the AHF Clinic in order to arrange taxi transportation.  She does say that she plans to come therefore I have called and will have taxi at her apt at 2 pm.  She is aware and agreeable.

## 2015-04-04 ENCOUNTER — Encounter (HOSPITAL_COMMUNITY): Payer: Self-pay

## 2015-04-07 ENCOUNTER — Telehealth: Payer: Self-pay | Admitting: Licensed Clinical Social Worker

## 2015-04-07 NOTE — Telephone Encounter (Signed)
CSW contacted patient to follow up on missed clinic appointment. Patient states "I chickened out". Patient spoke at length about her missed appointment and plan to rescheule for mid December. CSW provided support and will continue to follow for compliance. Lasandra Beech, LCSW [phone]

## 2015-04-09 ENCOUNTER — Inpatient Hospital Stay: Payer: Self-pay | Admitting: Family Medicine

## 2015-04-22 ENCOUNTER — Telehealth (HOSPITAL_COMMUNITY): Payer: Self-pay | Admitting: Surgery

## 2015-04-22 NOTE — Telephone Encounter (Signed)
Heart Failure Nurse Navigator Note  I called to check on Kaitlyn [name]--she reports "feeling amazing" and is very grateful to have all of her medications.  She does express some concern over having extra money taken out of her SS check for Medicare as her Medicare became effective Dec. 1.  She says she was barely able to pay rent, cable and groceries before.  I inquired if anyone from [facility] had contacted her and she says she would like to wait to see how her money is after the first of the year.  She says that she will call when she can make an appt with the AHF Clinic.  I encouraged her to make an appt and to call me back with any concerns or questions related to her HF.

## 2015-04-24 ENCOUNTER — Telehealth: Payer: Self-pay | Admitting: Licensed Clinical Social Worker

## 2015-04-24 NOTE — Telephone Encounter (Signed)
CSW contacted patient to follow up on Extra help program and [employer]. Patient reports she received paperwork in the mail stating she was approved for Extra Help. CSW discussed further the [facility] program and provided patient with the number to call for follow up. Patient verbalizes understanding and will call CSW if further issues develop. CSW continues to follow for supportive needs. Lasandra Beech, LCSW [phone]

## 2015-04-28 ENCOUNTER — Telehealth: Payer: Self-pay | Admitting: Licensed Clinical Social Worker

## 2015-04-28 NOTE — Telephone Encounter (Signed)
CSW returned patient's call regarding follow up with the Medicare Savings program. Patient states she received information about approval for Extra Help program and has some questions regarding the Medicare savings program. CSW discussed follow up for savings program and access numbers. Patient verbalized understanding and grateful for assistance. Patient states she has her medications and has been compliant for regimen. CSW will continue to follow for support and assistance as needed. Lasandra Beech, LCSW [phone]

## 2015-07-07 ENCOUNTER — Encounter (HOSPITAL_COMMUNITY): Payer: Self-pay | Admitting: *Deleted

## 2015-07-07 ENCOUNTER — Observation Stay (HOSPITAL_COMMUNITY)
Admission: EM | Admit: 2015-07-07 | Discharge: 2015-07-08 | Disposition: A | Discharge status: Home / Self Care | Payer: Medicare Other | Attending: Internal Medicine | Admitting: Internal Medicine

## 2015-07-07 ENCOUNTER — Emergency Department (HOSPITAL_COMMUNITY): Payer: Medicare Other

## 2015-07-07 DIAGNOSIS — I4430 Unspecified atrioventricular block: Secondary | ICD-10-CM | POA: Insufficient documentation

## 2015-07-07 DIAGNOSIS — I4892 Unspecified atrial flutter: Secondary | ICD-10-CM | POA: Diagnosis not present

## 2015-07-07 DIAGNOSIS — T7840XA Allergy, unspecified, initial encounter: Secondary | ICD-10-CM | POA: Diagnosis not present

## 2015-07-07 DIAGNOSIS — R197 Diarrhea, unspecified: Secondary | ICD-10-CM | POA: Diagnosis not present

## 2015-07-07 DIAGNOSIS — Z7982 Long term (current) use of aspirin: Secondary | ICD-10-CM | POA: Diagnosis not present

## 2015-07-07 DIAGNOSIS — I4581 Long QT syndrome: Secondary | ICD-10-CM | POA: Diagnosis not present

## 2015-07-07 DIAGNOSIS — E872 Acidosis, unspecified: Secondary | ICD-10-CM | POA: Diagnosis present

## 2015-07-07 DIAGNOSIS — I481 Persistent atrial fibrillation: Secondary | ICD-10-CM | POA: Diagnosis not present

## 2015-07-07 DIAGNOSIS — E876 Hypokalemia: Secondary | ICD-10-CM | POA: Insufficient documentation

## 2015-07-07 DIAGNOSIS — T783XXA Angioneurotic edema, initial encounter: Principal | ICD-10-CM

## 2015-07-07 DIAGNOSIS — T782XXA Anaphylactic shock, unspecified, initial encounter: Secondary | ICD-10-CM

## 2015-07-07 DIAGNOSIS — I959 Hypotension, unspecified: Secondary | ICD-10-CM | POA: Insufficient documentation

## 2015-07-07 DIAGNOSIS — Z79899 Other long term (current) drug therapy: Secondary | ICD-10-CM | POA: Diagnosis not present

## 2015-07-07 DIAGNOSIS — X58XXXA Exposure to other specified factors, initial encounter: Secondary | ICD-10-CM | POA: Insufficient documentation

## 2015-07-07 DIAGNOSIS — E860 Dehydration: Secondary | ICD-10-CM | POA: Diagnosis present

## 2015-07-07 DIAGNOSIS — I5022 Chronic systolic (congestive) heart failure: Secondary | ICD-10-CM | POA: Diagnosis not present

## 2015-07-07 DIAGNOSIS — I10 Essential (primary) hypertension: Secondary | ICD-10-CM | POA: Diagnosis not present

## 2015-07-07 DIAGNOSIS — E1165 Type 2 diabetes mellitus with hyperglycemia: Secondary | ICD-10-CM | POA: Diagnosis not present

## 2015-07-07 DIAGNOSIS — I429 Cardiomyopathy, unspecified: Secondary | ICD-10-CM | POA: Diagnosis not present

## 2015-07-07 DIAGNOSIS — I110 Hypertensive heart disease with heart failure: Secondary | ICD-10-CM | POA: Insufficient documentation

## 2015-07-07 DIAGNOSIS — N179 Acute kidney failure, unspecified: Secondary | ICD-10-CM | POA: Insufficient documentation

## 2015-07-07 DIAGNOSIS — E119 Type 2 diabetes mellitus without complications: Secondary | ICD-10-CM | POA: Diagnosis present

## 2015-07-07 DIAGNOSIS — R220 Localized swelling, mass and lump, head: Secondary | ICD-10-CM | POA: Diagnosis present

## 2015-07-07 LAB — COMPREHENSIVE METABOLIC PANEL
ALT: 13 U/L — ABNORMAL LOW (ref 14–54)
AST: 19 U/L (ref 15–41)
Albumin: 3.5 g/dL (ref 3.5–5.0)
Alkaline Phosphatase: 45 U/L (ref 38–126)
Anion gap: 15 (ref 5–15)
BUN: 10 mg/dL (ref 6–20)
CO2: 18 mmol/L — ABNORMAL LOW (ref 22–32)
Calcium: 8.3 mg/dL — ABNORMAL LOW (ref 8.9–10.3)
Chloride: 110 mmol/L (ref 101–111)
Creatinine, Ser: 1.26 mg/dL — ABNORMAL HIGH (ref 0.44–1.00)
GFR calc Af Amer: 51 mL/min — ABNORMAL LOW (ref >60)
GFR calc non Af Amer: 44 mL/min — ABNORMAL LOW (ref >60)
Glucose, Bld: 366 mg/dL — ABNORMAL HIGH (ref 65–99)
Potassium: 2.9 mmol/L — ABNORMAL LOW (ref 3.5–5.1)
Sodium: 143 mmol/L (ref 135–145)
Total Bilirubin: 1.3 mg/dL — ABNORMAL HIGH (ref 0.3–1.2)
Total Protein: 6.5 g/dL (ref 6.5–8.1)

## 2015-07-07 LAB — URINALYSIS, ROUTINE W REFLEX MICROSCOPIC
Bilirubin Urine: NEGATIVE
Glucose, UA: 250 mg/dL — AB
Hgb urine dipstick: NEGATIVE
Ketones, ur: NEGATIVE mg/dL
Leukocytes, UA: NEGATIVE
Nitrite: NEGATIVE
Protein, ur: 30 mg/dL — AB
Specific Gravity, Urine: 1.011 (ref 1.005–1.030)
pH: 6 (ref 5.0–8.0)

## 2015-07-07 LAB — CBC WITH DIFFERENTIAL/PLATELET
Basophils Absolute: 0 10*3/uL (ref 0.0–0.1)
Basophils Relative: 0 %
Eosinophils Absolute: 0.3 10*3/uL (ref 0.0–0.7)
Eosinophils Relative: 1 %
HCT: 43.7 % (ref 36.0–46.0)
Hemoglobin: 14.2 g/dL (ref 12.0–15.0)
Lymphocytes Relative: 10 %
Lymphs Abs: 1.9 10*3/uL (ref 0.7–4.0)
MCH: 34.7 pg — ABNORMAL HIGH (ref 26.0–34.0)
MCHC: 32.5 g/dL (ref 30.0–36.0)
MCV: 106.8 fL — ABNORMAL HIGH (ref 78.0–100.0)
Monocytes Absolute: 0.8 10*3/uL (ref 0.1–1.0)
Monocytes Relative: 4 %
Neutro Abs: 16.6 10*3/uL — ABNORMAL HIGH (ref 1.7–7.7)
Neutrophils Relative %: 85 %
Platelets: 261 10*3/uL (ref 150–400)
RBC: 4.09 MIL/uL (ref 3.87–5.11)
RDW: 15.3 % (ref 11.5–15.5)
WBC: 19.6 10*3/uL — ABNORMAL HIGH (ref 4.0–10.5)

## 2015-07-07 LAB — URINE MICROSCOPIC-ADD ON: Bacteria, UA: NONE SEEN

## 2015-07-07 LAB — CBG MONITORING, ED
Glucose-Capillary: 236 mg/dL — ABNORMAL HIGH (ref 65–99)
Glucose-Capillary: 144 mg/dL — ABNORMAL HIGH (ref 65–99)

## 2015-07-07 LAB — I-STAT CG4 LACTIC ACID, ED: Lactic Acid, Venous: 3.05 mmol/L (ref 0.5–2.0)

## 2015-07-07 LAB — LACTIC ACID, PLASMA: Lactic Acid, Venous: 2 mmol/L (ref 0.5–2.0)

## 2015-07-07 MED ORDER — POTASSIUM CHLORIDE 20 MEQ/15ML (10%) PO SOLN
40.0000 meq | Freq: Once | ORAL | Status: AC
  Administered 2015-07-07: 40 meq via ORAL
  Filled 2015-07-07: qty 30

## 2015-07-07 MED ORDER — SODIUM CHLORIDE 0.9 % IV BOLUS (SEPSIS)
500.0000 mL | Freq: Once | INTRAVENOUS | Status: AC
  Administered 2015-07-07: 500 mL via INTRAVENOUS

## 2015-07-07 MED ORDER — SODIUM CHLORIDE 0.9 % IV BOLUS (SEPSIS): 1000.0000 mL | Freq: Once | INTRAVENOUS | Status: DC

## 2015-07-07 MED ORDER — ONDANSETRON HCL 4 MG PO TABS: 4.0000 mg | ORAL_TABLET | Freq: Four times a day (QID) | ORAL | Status: DC | PRN

## 2015-07-07 MED ORDER — DILTIAZEM HCL ER COATED BEADS 240 MG PO CP24
240.0000 mg | ORAL_CAPSULE | Freq: Every day | ORAL | Status: DC
  Administered 2015-07-07 – 2015-07-08 (×2): 240 mg via ORAL
  Filled 2015-07-07 (×2): qty 1

## 2015-07-07 MED ORDER — METOPROLOL SUCCINATE ER 50 MG PO TB24
50.0000 mg | ORAL_TABLET | Freq: Two times a day (BID) | ORAL | Status: DC
  Administered 2015-07-07 – 2015-07-08 (×2): 50 mg via ORAL
  Filled 2015-07-07 (×3): qty 1

## 2015-07-07 MED ORDER — ENOXAPARIN SODIUM 60 MG/0.6ML ~~LOC~~ SOLN
60.0000 mg | SUBCUTANEOUS | Status: DC
  Filled 2015-07-07: qty 0.6

## 2015-07-07 MED ORDER — ACETAMINOPHEN 325 MG PO TABS: 650.0000 mg | ORAL_TABLET | Freq: Four times a day (QID) | ORAL | Status: DC | PRN

## 2015-07-07 MED ORDER — ASPIRIN 325 MG PO TABS
325.0000 mg | ORAL_TABLET | Freq: Every day | ORAL | Status: DC
  Filled 2015-07-07 (×2): qty 1

## 2015-07-07 MED ORDER — DIPHENOXYLATE-ATROPINE 2.5-0.025 MG PO TABS
1.0000 | ORAL_TABLET | Freq: Four times a day (QID) | ORAL | Status: DC | PRN
Start: 2015-07-07 — End: 2015-07-08
  Administered 2015-07-07: 1 via ORAL
  Filled 2015-07-07: qty 1

## 2015-07-07 MED ORDER — DIGOXIN 125 MCG PO TABS
0.1250 mg | ORAL_TABLET | Freq: Every day | ORAL | Status: DC
  Administered 2015-07-07 – 2015-07-08 (×2): 0.125 mg via ORAL
  Filled 2015-07-07 (×2): qty 1

## 2015-07-07 MED ORDER — INSULIN ASPART 100 UNIT/ML ~~LOC~~ SOLN
0.0000 [IU] | Freq: Three times a day (TID) | SUBCUTANEOUS | Status: DC
  Administered 2015-07-08: 3 [IU] via SUBCUTANEOUS
  Filled 2015-07-07: qty 1

## 2015-07-07 MED ORDER — INSULIN ASPART 100 UNIT/ML ~~LOC~~ SOLN
0.0000 [IU] | Freq: Every day | SUBCUTANEOUS | Status: DC
  Administered 2015-07-07: 0 [IU] via SUBCUTANEOUS
  Filled 2015-07-07: qty 1

## 2015-07-07 MED ORDER — ONDANSETRON HCL 4 MG/2ML IJ SOLN: 4.0000 mg | Freq: Four times a day (QID) | INTRAMUSCULAR | Status: DC | PRN

## 2015-07-07 MED ORDER — POTASSIUM CHLORIDE CRYS ER 20 MEQ PO TBCR
40.0000 meq | EXTENDED_RELEASE_TABLET | Freq: Once | ORAL | Status: DC
  Filled 2015-07-07: qty 2

## 2015-07-07 MED ORDER — POTASSIUM CHLORIDE CRYS ER 20 MEQ PO TBCR: 40.0000 meq | EXTENDED_RELEASE_TABLET | Freq: Once | ORAL | Status: DC

## 2015-07-07 MED ORDER — SODIUM CHLORIDE 0.9% FLUSH
3.0000 mL | Freq: Two times a day (BID) | INTRAVENOUS | Status: DC
  Administered 2015-07-07 – 2015-07-08 (×2): 3 mL via INTRAVENOUS

## 2015-07-07 MED ORDER — GLIPIZIDE 5 MG PO TABS
5.0000 mg | ORAL_TABLET | Freq: Two times a day (BID) | ORAL | Status: DC
  Administered 2015-07-08: 5 mg via ORAL
  Filled 2015-07-07 (×3): qty 1

## 2015-07-07 MED ORDER — ACETAMINOPHEN 650 MG RE SUPP: 650.0000 mg | Freq: Four times a day (QID) | RECTAL | Status: DC | PRN

## 2015-07-07 MED ORDER — ALUM & MAG HYDROXIDE-SIMETH 200-200-20 MG/5ML PO SUSP: 30.0000 mL | Freq: Four times a day (QID) | ORAL | Status: DC | PRN

## 2015-07-07 MED ORDER — SODIUM CHLORIDE 0.9 % IV SOLN
INTRAVENOUS | Status: DC
  Administered 2015-07-07: 19:00:00 via INTRAVENOUS

## 2015-07-07 MED ORDER — DIPHENHYDRAMINE HCL 25 MG PO CAPS: 25.0000 mg | ORAL_CAPSULE | Freq: Three times a day (TID) | ORAL | Status: DC | PRN

## 2015-07-07 NOTE — Progress Notes (Signed)
CSW staffed with EDP. CSW along with Nurse CM spoke with patient at bedside. Nurse CM spoke with patient regarding various resources in the community that could assist her with food. Patient reports she had only been eating white bread and mayonnaise, as this is all she had due to her money "not lasting this time". Patient reports she will not get money again until Wednesday. Patient reports Wednesday she will have food. Patient reports she purchases groceries and [employer] delivers. CSW provided patient [employer] and a food pantry list to assist with food resources. Patient wanted to know if anyone on the food pantry list deliver. CSW informed patient that it is not known if anyone on the food pantry delivers.      Patient reports she has not taken her heart medication because of the allergy attack she had on Saturday. Patient reports she had diarrhea ten times yesterday and she is itching again today. Patient reports she is not lightheaded, however, she reports she feels a little congested. Patient reports as of December 1st, she now has Medicare. No other questions noted for CSW at this time.   Elenore Paddy [phone] ED CSW 07/07/2015 3:31 PM

## 2015-07-07 NOTE — ED Notes (Addendum)
MD at bedside. 

## 2015-07-07 NOTE — Progress Notes (Addendum)
ED CM and ED SW met with pt when EDP voiced concern with pt welfare, food, home concerns Speaking with pt she voiced concern that her check did not allow her to have enough food for the week Pt states she ran out of food and has been eating bread and water Reports this will be resolved for her on "wednesday" States at that time she will be able to get food Cm and SW discussed meals on wheels, senior services of [place] resources and local food pantries Pt was given a copy of the [place] green book for pantries from ED SW Pt reports having sisters in [place] and minimal support in [place] and does not drive Has a relative, [place] but he lives in [place] had been having ordering food from a local grocery and food has been delivered to her home prior to this. Cm discussed also with ED RN pt concerns asked about a food tray for pt and sandwich if d/c
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
## 2015-07-07 NOTE — ED Notes (Signed)
Dr Criss Alvine aware of lactic of 3.05

## 2015-07-07 NOTE — Progress Notes (Signed)
Completed referral to meals on wheels for pt [phone]  Spoke with Gerlene Burdock who states they received a referral for pt "two months ago" but she did not return calls or answer her phone  Verified correct address in EPIC and provided to [employer]

## 2015-07-07 NOTE — ED Notes (Addendum)
Pt reports she began to have hives, itching, tongue swelling and lightheadedness this am. PTAR arrived and gave 50mg  po benedryl and epi. Pt states all symptoms feel better at this point, but still has slight lightheadedness. Pt speaking in full sentences with no apparent difficulty. Hx of "borderline DM." CBG 384. Cannot think of what caused allergic reaction. EMS gave NS prior to arrival.
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
## 2015-07-07 NOTE — ED Notes (Signed)
Bed: HF41 Expected date:  Expected time:  Means of arrival:  Comments: EMS- 64yo F, allergic reaction, epi and benadryl given

## 2015-07-07 NOTE — Progress Notes (Signed)
Pt given a list of medicare in network internal medicine within zip [phone] and discussed importance of pcp f/u care Pt states most of her care has come from her CV at [facility] Reports having enough Rx to last the rest of 2017  Pt given a list of pcps that make home visits Pt given a list of [place] transportation services for 60 and older residents  Pt given copies of programs at [facility] [place]  [employer] of resources provided

## 2015-07-08 DIAGNOSIS — T7840XA Allergy, unspecified, initial encounter: Secondary | ICD-10-CM

## 2015-07-08 DIAGNOSIS — T783XXA Angioneurotic edema, initial encounter: Secondary | ICD-10-CM | POA: Diagnosis not present

## 2015-07-08 LAB — CBC
HCT: 34.8 % — ABNORMAL LOW (ref 36.0–46.0)
Hemoglobin: 11.5 g/dL — ABNORMAL LOW (ref 12.0–15.0)
MCH: 35.5 pg — ABNORMAL HIGH (ref 26.0–34.0)
MCHC: 33 g/dL (ref 30.0–36.0)
MCV: 107.4 fL — ABNORMAL HIGH (ref 78.0–100.0)
Platelets: 180 10*3/uL (ref 150–400)
RBC: 3.24 MIL/uL — ABNORMAL LOW (ref 3.87–5.11)
RDW: 15.6 % — ABNORMAL HIGH (ref 11.5–15.5)
WBC: 9.5 10*3/uL (ref 4.0–10.5)

## 2015-07-08 LAB — BASIC METABOLIC PANEL
Anion gap: 7 (ref 5–15)
BUN: 10 mg/dL (ref 6–20)
CO2: 21 mmol/L — ABNORMAL LOW (ref 22–32)
Calcium: 8.6 mg/dL — ABNORMAL LOW (ref 8.9–10.3)
Chloride: 113 mmol/L — ABNORMAL HIGH (ref 101–111)
Creatinine, Ser: 0.91 mg/dL (ref 0.44–1.00)
GFR calc Af Amer: >60 mL/min (ref >60)
GFR calc non Af Amer: >60 mL/min (ref >60)
Glucose, Bld: 143 mg/dL — ABNORMAL HIGH (ref 65–99)
Potassium: 3.8 mmol/L (ref 3.5–5.1)
Sodium: 141 mmol/L (ref 135–145)

## 2015-07-08 LAB — CBG MONITORING, ED
Glucose-Capillary: 176 mg/dL — ABNORMAL HIGH (ref 65–99)
Glucose-Capillary: 107 mg/dL — ABNORMAL HIGH (ref 65–99)

## 2015-07-08 MED ORDER — DIPHENOXYLATE-ATROPINE 2.5-0.025 MG PO TABS: 1.0000 | ORAL_TABLET | Freq: Four times a day (QID) | ORAL | Status: DC | PRN

## 2015-07-08 MED ORDER — FUROSEMIDE 20 MG PO TABS: 60.0000 mg | ORAL_TABLET | Freq: Two times a day (BID) | ORAL | Status: DC

## 2015-07-08 MED ORDER — HYDRALAZINE HCL 10 MG PO TABS: 10.0000 mg | ORAL_TABLET | Freq: Three times a day (TID) | ORAL | Status: DC

## 2015-07-08 NOTE — Discharge Summary (Signed)
Physician Discharge Summary  Kaitlyn [name] ZOX:[id number] DOB: 1950-10-20 DOA: 07/07/2015  PCP: No PCP Per Patient  Admit date: 07/07/2015 Discharge date: 07/08/2015  Recommendations for Outpatient Follow-up:  1. Pt will need to follow up with PCP in 2-3 weeks post discharge 2. Please obtain BMP to evaluate electrolytes and kidney function 3. Please also check CBC to evaluate Hg and Hct levels 4. Please note that Lisinopril was listed under ALLERGIES due to tongue swelling   Discharge Diagnoses:  Active Problems:   Cardiomyopathy-EF 15-20% by echo Jan 2016   Diabetes type 2, uncontrolled (HCC)   Angioedema   Allergic reaction  Discharge Condition: Stable  Diet recommendation: Heart healthy diet discussed in details   History of present illness:  65 y.o. female with a past medical history of chronic systolic congestive heart failure however last transthoracic echocardiogram performed on 10/10/2014 that showed an ejection fraction of 20-25%, hypertension, paroxysmal atrial flutter, diabetes mellitus, brought to the emergency department by EMS. She reported 2 hours prior to arrival that she woke up with sensation of tongue swelling that was associated with generalized pruritus. EMS administered 50 mg of IV Benadryl and intramuscular epinephrine. On arrival to the emergency department it appears that symptoms resolved. She reported having similar symptoms several days ago for which she took Benadryl after which symptoms resolved.   Hospital Course:   Active Problems:   Tongue swelling due to angioedema  - suspected to be due to Lisinopril but not very clear, so we placed lisinopril under Allergies just in case - pt started on hydralazine for better blood pressure control instead and was advised to follow up with her PCP and cardiologist     Cardiomyopathy-EF 15-20% by echo Jan 2016 - ? Medical compliance, she says she is not taking lasix as it makes her sick but when asked specific  symptoms she could  not recall - discussed compliance in detail with pt and referred her for follow up with PCP as she does not wants to stay in hospital - she says it is too crowded  - currently no sings of volume overload     Diabetes type 2, uncontrolled (HCC) - continue home medical regimen     Lactic acidosis - likely from acute illness but no clear infectious etiology elicited - no fevers and no WBC - lactic acid normalized with IVF    Procedures/Studies: Dg Chest Portable 1 View 07/07/2015 Stable cardiomegaly.  No active lung disease.   Discharge Exam: Filed Vitals:   07/08/15 0746 07/08/15 0800  BP: 144/77 161/102  Pulse: 90 77  Temp:    Resp: 17 21   Filed Vitals:   07/08/15 0700 07/08/15 0745 07/08/15 0746 07/08/15 0800  BP: 159/72 144/77 144/77 161/102  Pulse: 82 84 90 77  Temp:      TempSrc:      Resp: 18 19 17 21   SpO2: 93% 97% 97% 96%    General: Pt is alert, follows commands appropriately, not in acute distress Cardiovascular: Regular rate and rhythm, no rubs, no gallops Respiratory: Clear to auscultation bilaterally, no wheezing, no crackles, no rhonchi Abdominal: Soft, non tender, non distended, bowel sounds +, no guarding Extremities: no cyanosis, pulses palpable bilaterally DP and PT Neuro: Grossly nonfocal  Discharge Instructions  Discharge Instructions    Diet - low sodium heart healthy    Complete by:  As directed      Increase activity slowly    Complete by:  As directed  Medication List    STOP taking these medications        lisinopril 10 MG tablet  Commonly known as:  PRINIVIL,ZESTRIL      TAKE these medications        aspirin 325 MG tablet  Take 1 tablet (325 mg total) by mouth daily.     digoxin 0.125 MG tablet  Commonly known as:  LANOXIN  Take 1 tablet (0.125 mg total) by mouth daily.     diltiazem 240 MG 24 hr capsule  Commonly known as:  CARDIZEM CD  Take 1 capsule (240 mg total) by mouth daily.      diphenhydrAMINE 25 MG tablet  Commonly known as:  BENADRYL  Take 1 tablet (25 mg total) by mouth every 8 (eight) hours as needed for itching or allergies.     diphenoxylate-atropine 2.5-0.025 MG tablet  Commonly known as:  LOMOTIL  Take 1 tablet by mouth 4 (four) times daily as needed for diarrhea or loose stools.     furosemide 20 MG tablet  Commonly known as:  LASIX  Take 3 tablets (60 mg total) by mouth 2 (two) times daily.     glipiZIDE 5 MG tablet  Commonly known as:  GLUCOTROL  Take 1 tablet (5 mg total) by mouth 2 (two) times daily before a meal.     hydrALAZINE 10 MG tablet  Commonly known as:  APRESOLINE  Take 1 tablet (10 mg total) by mouth 3 (three) times daily.     metoprolol succinate 50 MG 24 hr tablet  Commonly known as:  TOPROL-XL  Take 1 tablet (50 mg total) by mouth 2 (two) times daily. Take with or immediately following a meal.     potassium chloride SA 20 MEQ tablet  Commonly known as:  K-DUR,KLOR-CON  Take 1 tablet (20 mEq total) by mouth 2 (two) times daily.           Follow-up Information    Follow up with mobile meals On 07/11/2015.   Why:  a referral has been placed for you with meals on wheels They will call you please return their call    Contact information:   [phone] delivers a nutritious noontime meal to homebound senior adults age 53 and over. Volunteers from the faith community, local businesses and schools work in pairs to deliver the meals, which are prepared by a Sales promotion account executive.       Follow up with Please use the [facility] [place] [facility], doctors making home visits and transportation.   Why:  As needed   Contact information:   free food pantries in [place]        The results of significant diagnostics from this hospitalization (including imaging, microbiology, ancillary and laboratory) are listed below for reference.     Microbiology: No results found for this or any previous visit (from the past 240 hour(s)).   Labs: Basic  Metabolic Panel:  Recent Labs Lab 07/07/15 1116 07/08/15 0437  NA 143 141  K 2.9* 3.8  CL 110 113*  CO2 18* 21*  GLUCOSE 366* 143*  BUN 10 10  CREATININE 1.26* 0.91  CALCIUM 8.3* 8.6*   Liver Function Tests:  Recent Labs Lab 07/07/15 1116  AST 19  ALT 13*  ALKPHOS 45  BILITOT 1.3*  PROT 6.5  ALBUMIN 3.5   No results for input(s): LIPASE, AMYLASE in the last 168 hours. No results for input(s): AMMONIA in the last 168 hours. CBC:  Recent Labs Lab 07/07/15 1116  07/08/15 0437  WBC 19.6* 9.5  NEUTROABS 16.6*  --   HGB 14.2 11.5*  HCT 43.7 34.8*  MCV 106.8* 107.4*  PLT 261 180   Cardiac Enzymes: No results for input(s): CKTOTAL, CKMB, CKMBINDEX, TROPONINI in the last 168 hours. BNP: BNP (last 3 results)  Recent Labs  01/22/15 0212 02/12/15 0757 03/24/15 1333  BNP 872.8* 1107.0* 665.2*    ProBNP (last 3 results) No results for input(s): PROBNP in the last 8760 hours.  CBG:  Recent Labs Lab 07/07/15 1543 07/07/15 2229 07/08/15 0748  GLUCAP 236* 144* 176*   SIGNED: Time coordinating discharge: 30 minutes  MAGICK-Rajvir Ernster, MD  [facility] 07/08/2015, 10:11 AM Pager [phone]  If 7PM-7AM, please contact night-coverage [email or web address] Password [email or web address]

## 2015-07-08 NOTE — Discharge Instructions (Signed)

## 2015-07-08 NOTE — ED Notes (Signed)
Bed: XY58 Expected date:  Expected time:  Means of arrival:  Comments: Admission hold

## 2015-08-13 ENCOUNTER — Telehealth (HOSPITAL_COMMUNITY): Payer: Self-pay | Admitting: Surgery

## 2015-09-03 NOTE — Telephone Encounter (Signed)
Left message to check on Kaitlyn [name].   Requested her to call back with any needs or questions.

## 2015-09-10 ENCOUNTER — Emergency Department (HOSPITAL_COMMUNITY): Payer: Medicare Other

## 2015-09-10 ENCOUNTER — Emergency Department (HOSPITAL_COMMUNITY)
Admission: EM | Admit: 2015-09-10 | Discharge: 2015-09-10 | Disposition: A | Discharge status: Home / Self Care | Payer: Medicare Other | Attending: Emergency Medicine | Admitting: Emergency Medicine

## 2015-09-10 ENCOUNTER — Encounter (HOSPITAL_COMMUNITY): Payer: Self-pay | Admitting: Emergency Medicine

## 2015-09-10 DIAGNOSIS — I10 Essential (primary) hypertension: Secondary | ICD-10-CM | POA: Diagnosis not present

## 2015-09-10 DIAGNOSIS — R002 Palpitations: Secondary | ICD-10-CM

## 2015-09-10 DIAGNOSIS — R000 Tachycardia, unspecified: Secondary | ICD-10-CM | POA: Diagnosis not present

## 2015-09-10 DIAGNOSIS — Z872 Personal history of diseases of the skin and subcutaneous tissue: Secondary | ICD-10-CM | POA: Insufficient documentation

## 2015-09-10 DIAGNOSIS — Z79899 Other long term (current) drug therapy: Secondary | ICD-10-CM | POA: Insufficient documentation

## 2015-09-10 DIAGNOSIS — M170 Bilateral primary osteoarthritis of knee: Secondary | ICD-10-CM | POA: Diagnosis not present

## 2015-09-10 DIAGNOSIS — Z9119 Patient's noncompliance with other medical treatment and regimen: Secondary | ICD-10-CM | POA: Insufficient documentation

## 2015-09-10 DIAGNOSIS — I5022 Chronic systolic (congestive) heart failure: Secondary | ICD-10-CM | POA: Insufficient documentation

## 2015-09-10 DIAGNOSIS — Z880 Allergy status to penicillin: Secondary | ICD-10-CM | POA: Diagnosis not present

## 2015-09-10 DIAGNOSIS — E119 Type 2 diabetes mellitus without complications: Secondary | ICD-10-CM | POA: Insufficient documentation

## 2015-09-10 DIAGNOSIS — Z7982 Long term (current) use of aspirin: Secondary | ICD-10-CM | POA: Insufficient documentation

## 2015-09-10 DIAGNOSIS — R0602 Shortness of breath: Secondary | ICD-10-CM | POA: Diagnosis not present

## 2015-09-10 LAB — CBC WITH DIFFERENTIAL/PLATELET
Basophils Absolute: 0.1 10*3/uL (ref 0.0–0.1)
Basophils Relative: 1 %
Eosinophils Absolute: 0.5 10*3/uL (ref 0.0–0.7)
Eosinophils Relative: 4 %
HCT: 40.3 % (ref 36.0–46.0)
Hemoglobin: 13.2 g/dL (ref 12.0–15.0)
Lymphocytes Relative: 18 %
Lymphs Abs: 2.2 10*3/uL (ref 0.7–4.0)
MCH: 33.2 pg (ref 26.0–34.0)
MCHC: 32.8 g/dL (ref 30.0–36.0)
MCV: 101.5 fL — ABNORMAL HIGH (ref 78.0–100.0)
Monocytes Absolute: 0.9 10*3/uL (ref 0.1–1.0)
Monocytes Relative: 7 %
Neutro Abs: 8.7 10*3/uL — ABNORMAL HIGH (ref 1.7–7.7)
Neutrophils Relative %: 70 %
Platelets: 214 10*3/uL (ref 150–400)
RBC: 3.97 MIL/uL (ref 3.87–5.11)
RDW: 14.8 % (ref 11.5–15.5)
WBC: 12.3 10*3/uL — ABNORMAL HIGH (ref 4.0–10.5)

## 2015-09-10 LAB — BASIC METABOLIC PANEL
Anion gap: 11 (ref 5–15)
BUN: 11 mg/dL (ref 6–20)
CO2: 22 mmol/L (ref 22–32)
Calcium: 9.1 mg/dL (ref 8.9–10.3)
Chloride: 105 mmol/L (ref 101–111)
Creatinine, Ser: 0.92 mg/dL (ref 0.44–1.00)
GFR calc Af Amer: >60 mL/min (ref >60)
GFR calc non Af Amer: >60 mL/min (ref >60)
Glucose, Bld: 215 mg/dL — ABNORMAL HIGH (ref 65–99)
Potassium: 3.8 mmol/L (ref 3.5–5.1)
Sodium: 138 mmol/L (ref 135–145)

## 2015-09-10 LAB — MAGNESIUM: Magnesium: 1.7 mg/dL (ref 1.7–2.4)

## 2015-09-10 LAB — DIGOXIN LEVEL: Digoxin Level: 0.3 ng/mL — ABNORMAL LOW (ref 0.8–2.0)

## 2015-09-10 MED ORDER — DILTIAZEM HCL ER COATED BEADS 240 MG PO CP24
240.0000 mg | ORAL_CAPSULE | Freq: Once | ORAL | Status: AC
  Administered 2015-09-10: 240 mg via ORAL
  Filled 2015-09-10 (×2): qty 1

## 2015-09-10 MED ORDER — DIGOXIN 125 MCG PO TABS
0.1250 mg | ORAL_TABLET | Freq: Once | ORAL | Status: AC
  Administered 2015-09-10: 0.125 mg via ORAL
  Filled 2015-09-10: qty 1

## 2015-09-10 NOTE — ED Notes (Signed)
Cab voucher given

## 2015-09-10 NOTE — ED Notes (Signed)
Pt states to RN that she needs cab voucher to go home; RN offered bus pass; pt states she can not use bus pass; Pt asked if [facility] can be called; pt states she is able to walk but left walker at home because she did not want walker to get missing; Pt able to amblate in room and put self on bedside without assistance;Pt states she has no money to pay for cab however RN witnessed pt ordering grocery over the phone using debit card; RN asked pt how she intends to get in house if cab is used since she can only walk with walker; pt states cab driver always assist her to front door; Pt placed in wheelchair and charge notified for assistance;

## 2015-09-10 NOTE — ED Provider Notes (Addendum)
Presents for evaluation with chief complaint of shortness of breath and palpitations. Has not taken her Cardizem today presents that she got off on her medications. . Her main completed today that her power in her apartment is found. She presents here stating that she occasionally feels a rapid heart rate. This probably does when she takes Cardizem and she did not take it today. Occasional for short of breath but not upon arrival here. Her exam shows clear lungs. After Cardizem she is in a controlled rhythm and rate. No indications for further studies or admission.  Rolland Porter, MD 09/10/15 1958  Rolland Porter, MD 09/20/15 2106

## 2015-09-10 NOTE — Discharge Instructions (Signed)
Atrial Fibrillation °Atrial fibrillation is a type of heartbeat that is irregular or fast (rapid). If you have this condition, your heart keeps quivering in a weird (chaotic) way. This condition can make it so your heart cannot pump blood normally. Having this condition gives a person more risk for stroke, heart failure, and other heart problems. There are different types of atrial fibrillation. Talk with your doctor to learn about the type that you have. °HOME CARE °· Take over-the-counter and prescription medicines only as told by your doctor. °· If your doctor prescribed a blood-thinning medicine, take it exactly as told. Taking too much of it can cause bleeding. If you do not take enough of it, you will not have the protection that you need against stroke and other problems. °· Do not use any tobacco products. These include cigarettes, chewing tobacco, and e-cigarettes. If you need help quitting, ask your doctor. °· If you have apnea (obstructive sleep apnea), manage it as told by your doctor. °· Do not drink alcohol. °· Do not drink beverages that have caffeine. These include coffee, soda, and tea. °· Maintain a healthy weight. Do not use diet pills unless your doctor says they are safe for you. Diet pills may make heart problems worse. °· Follow diet instructions as told by your doctor. °· Exercise regularly as told by your doctor. °· Keep all follow-up visits as told by your doctor. This is important. °GET HELP IF: °· You notice a change in the speed, rhythm, or strength of your heartbeat. °· You are taking a blood-thinning medicine and you notice more bruising. °· You get tired more easily when you move or exercise. °GET HELP RIGHT AWAY IF: °· You have pain in your chest or your belly (abdomen). °· You have sweating or weakness. °· You feel sick to your stomach (nauseous). °· You notice blood in your throw up (vomit), poop (stool), or pee (urine). °· You are short of breath. °· You suddenly have swollen feet  and ankles. °· You feel dizzy. °· Your suddenly get weak or numb in your face, arms, or legs, especially if it happens on one side of your body. °· You have trouble talking, trouble understanding, or both. °· Your face or your eyelid droops on one side. °These symptoms may be an emergency. Do not wait to see if the symptoms will go away. Get medical help right away. Call your local emergency services (911 in the [place] Do not drive yourself to the hospital. °  °This information is not intended to replace advice given to you by your health care provider. Make sure you discuss any questions you have with your health care provider. °  °Document Released: 02/10/2008 Document Revised: 01/22/2015 Document Reviewed: 08/28/2014 °Elsevier Interactive Patient Education ©2016 [employer] ° °

## 2015-09-10 NOTE — ED Notes (Signed)
Per EMS, pt coming in for SOB since yesterday. Pt denies pain. BP: 105/67, HR:80-110 A-fib which is baseline for the pt. CBG: 226, SpO2 96%. Pt alert x4. NAD at this time.
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
## 2015-09-16 ENCOUNTER — Emergency Department (HOSPITAL_COMMUNITY): Payer: Medicare Other

## 2015-09-16 ENCOUNTER — Encounter (HOSPITAL_COMMUNITY): Payer: Self-pay | Admitting: Emergency Medicine

## 2015-09-16 ENCOUNTER — Emergency Department (HOSPITAL_COMMUNITY)
Admission: EM | Admit: 2015-09-16 | Discharge: 2015-09-17 | Disposition: A | Discharge status: Home / Self Care | Payer: Medicare Other | Attending: Emergency Medicine | Admitting: Emergency Medicine

## 2015-09-16 DIAGNOSIS — Z79899 Other long term (current) drug therapy: Secondary | ICD-10-CM | POA: Diagnosis not present

## 2015-09-16 DIAGNOSIS — Z872 Personal history of diseases of the skin and subcutaneous tissue: Secondary | ICD-10-CM | POA: Insufficient documentation

## 2015-09-16 DIAGNOSIS — F419 Anxiety disorder, unspecified: Secondary | ICD-10-CM | POA: Insufficient documentation

## 2015-09-16 DIAGNOSIS — E119 Type 2 diabetes mellitus without complications: Secondary | ICD-10-CM | POA: Insufficient documentation

## 2015-09-16 DIAGNOSIS — R0602 Shortness of breath: Secondary | ICD-10-CM

## 2015-09-16 DIAGNOSIS — I5022 Chronic systolic (congestive) heart failure: Secondary | ICD-10-CM | POA: Diagnosis not present

## 2015-09-16 DIAGNOSIS — M170 Bilateral primary osteoarthritis of knee: Secondary | ICD-10-CM | POA: Insufficient documentation

## 2015-09-16 DIAGNOSIS — I10 Essential (primary) hypertension: Secondary | ICD-10-CM | POA: Diagnosis not present

## 2015-09-16 DIAGNOSIS — Z880 Allergy status to penicillin: Secondary | ICD-10-CM | POA: Diagnosis not present

## 2015-09-16 DIAGNOSIS — I481 Persistent atrial fibrillation: Secondary | ICD-10-CM | POA: Insufficient documentation

## 2015-09-16 DIAGNOSIS — Z9119 Patient's noncompliance with other medical treatment and regimen: Secondary | ICD-10-CM | POA: Insufficient documentation

## 2015-09-16 LAB — BASIC METABOLIC PANEL WITH GFR
Anion gap: 10 (ref 5–15)
BUN: 12 mg/dL (ref 6–20)
CO2: 23 mmol/L (ref 22–32)
Calcium: 9.2 mg/dL (ref 8.9–10.3)
Chloride: 104 mmol/L (ref 101–111)
Creatinine, Ser: 0.95 mg/dL (ref 0.44–1.00)
GFR calc Af Amer: >60 mL/min
GFR calc non Af Amer: >60 mL/min
Glucose, Bld: 221 mg/dL — ABNORMAL HIGH (ref 65–99)
Potassium: 4 mmol/L (ref 3.5–5.1)
Sodium: 137 mmol/L (ref 135–145)

## 2015-09-16 LAB — CBC WITH DIFFERENTIAL/PLATELET
Basophils Absolute: 0.1 K/uL (ref 0.0–0.1)
Basophils Relative: 1 %
Eosinophils Absolute: 0.4 K/uL (ref 0.0–0.7)
Eosinophils Relative: 3 %
HCT: 42 % (ref 36.0–46.0)
Hemoglobin: 13.9 g/dL (ref 12.0–15.0)
Lymphocytes Relative: 14 %
Lymphs Abs: 1.9 K/uL (ref 0.7–4.0)
MCH: 33.8 pg (ref 26.0–34.0)
MCHC: 33.1 g/dL (ref 30.0–36.0)
MCV: 102.2 fL — ABNORMAL HIGH (ref 78.0–100.0)
Monocytes Absolute: 0.7 K/uL (ref 0.1–1.0)
Monocytes Relative: 5 %
Neutro Abs: 11 K/uL — ABNORMAL HIGH (ref 1.7–7.7)
Neutrophils Relative %: 77 %
Platelets: 226 K/uL (ref 150–400)
RBC: 4.11 MIL/uL (ref 3.87–5.11)
RDW: 14.8 % (ref 11.5–15.5)
WBC: 14.1 K/uL — ABNORMAL HIGH (ref 4.0–10.5)

## 2015-09-16 LAB — BRAIN NATRIURETIC PEPTIDE: B Natriuretic Peptide: 81.8 pg/mL (ref 0.0–100.0)

## 2015-09-16 LAB — I-STAT TROPONIN, ED: Troponin i, poc: 0 ng/mL (ref 0.00–0.08)

## 2015-09-16 LAB — DIGOXIN LEVEL: Digoxin Level: 0.4 ng/mL — ABNORMAL LOW (ref 0.8–2.0)

## 2015-09-16 MED ORDER — ACETAMINOPHEN 325 MG PO TABS
650.0000 mg | ORAL_TABLET | Freq: Once | ORAL | Status: AC
  Administered 2015-09-16: 650 mg via ORAL
  Filled 2015-09-16: qty 2

## 2015-09-16 NOTE — ED Notes (Signed)
Pt requesting diet coke.

## 2015-09-16 NOTE — ED Notes (Signed)
Pt removed her pulse ox monitor and does not want to wear it at this time.
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
## 2015-09-16 NOTE — ED Notes (Signed)
PA at beside.

## 2015-09-16 NOTE — ED Notes (Addendum)
Pt checking her email on her tablet. She appears to be in no distress.
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
## 2015-09-16 NOTE — ED Notes (Signed)
Pt given sandwich and water.
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
## 2015-09-17 NOTE — Discharge Instructions (Signed)
1. Medications: usual home medications 2. Treatment: rest, drink plenty of fluids 3. Follow Up: please followup with your primary doctor for discussion of your diagnoses and further evaluation after today's visit; if you do not have a primary care doctor use the phone number listed in your discharge paperwork to find one; please return to the ER for increased shortness of breath, chest pain, new or worsening symptoms   Generalized Anxiety Disorder Generalized anxiety disorder (GAD) is a mental disorder. It interferes with life functions, including relationships, work, and school. GAD is different from normal anxiety, which everyone experiences at some point in their lives in response to specific life events and activities. Normal anxiety actually helps [place] prepare for and get through these life events and activities. Normal anxiety goes away after the event or activity is over.  GAD causes anxiety that is not necessarily related to specific events or activities. It also causes excess anxiety in proportion to specific events or activities. The anxiety associated with GAD is also difficult to control. GAD can vary from mild to severe. People with severe GAD can have intense waves of anxiety with physical symptoms (panic attacks).  SYMPTOMS The anxiety and worry associated with GAD are difficult to control. This anxiety and worry are related to many life events and activities and also occur more days than not for 6 months or longer. People with GAD also have three or more of the following symptoms (one or more in children):  Restlessness.   Fatigue.  Difficulty concentrating.   Irritability.  Muscle tension.  Difficulty sleeping or unsatisfying sleep. DIAGNOSIS GAD is diagnosed through an assessment by your health care provider. Your health care provider will ask you questions aboutyour mood,physical symptoms, and events in your life. Your health care provider may ask you about your medical  history and use of alcohol or drugs, including prescription medicines. Your health care provider may also do a physical exam and blood tests. Certain medical conditions and the use of certain substances can cause symptoms similar to those associated with GAD. Your health care provider may refer you to a mental health specialist for further evaluation. TREATMENT The following therapies are usually used to treat GAD:   Medication. Antidepressant medication usually is prescribed for long-term daily control. Antianxiety medicines may be added in severe cases, especially when panic attacks occur.   Talk therapy (psychotherapy). Certain types of talk therapy can be helpful in treating GAD by providing support, education, and guidance. A form of talk therapy called cognitive behavioral therapy can teach you healthy ways to think about and react to daily life events and activities.  Stress managementtechniques. These include yoga, meditation, and exercise and can be very helpful when they are practiced regularly. A mental health specialist can help determine which treatment is best for you. Some people see improvement with one therapy. However, other people require a combination of therapies.   This information is not intended to replace advice given to you by your health care provider. Make sure you discuss any questions you have with your health care provider.   Document Released: 08/28/2012 Document Revised: 05/24/2014 Document Reviewed: 08/28/2012 Elsevier Interactive Patient Education 2016 [employer].  Shortness of Breath Shortness of breath means you have trouble breathing. It could also mean that you have a medical problem. You should get immediate medical care for shortness of breath. CAUSES   Not enough oxygen in the air such as with high altitudes or a smoke-filled room.  Certain lung diseases,  infections, or problems.  Heart disease or conditions, such as angina or heart  failure.  Low red blood cells (anemia).  Poor physical fitness, which can cause shortness of breath when you exercise.  Chest or back injuries or stiffness.  Being overweight.  Smoking.  Anxiety, which can make you feel like you are not getting enough air. DIAGNOSIS  Serious medical problems can often be found during your physical exam. Tests may also be done to determine why you are having shortness of breath. Tests may include:  Chest X-rays.  Lung function tests.  Blood tests.  An electrocardiogram (ECG).  An ambulatory electrocardiogram. An ambulatory ECG records your heartbeat patterns over a 24-hour period.  Exercise testing.  A transthoracic echocardiogram (TTE). During echocardiography, sound waves are used to evaluate how blood flows through your heart.  A transesophageal echocardiogram (TEE).  Imaging scans. Your health care provider may not be able to find a cause for your shortness of breath after your exam. In this case, it is important to have a follow-up exam with your health care provider as directed.  TREATMENT  Treatment for shortness of breath depends on the cause of your symptoms and can vary greatly. HOME CARE INSTRUCTIONS   Do not smoke. Smoking is a common cause of shortness of breath. If you smoke, ask for help to quit.  Avoid being around chemicals or things that may bother your breathing, such as paint fumes and dust.  Rest as needed. Slowly resume your usual activities.  If medicines were prescribed, take them as directed for the full length of time directed. This includes oxygen and any inhaled medicines.  Keep all follow-up appointments as directed by your health care provider. SEEK MEDICAL CARE IF:   Your condition does not improve in the time expected.  You have a hard time doing your normal activities even with rest.  You have any new symptoms. SEEK IMMEDIATE MEDICAL CARE IF:   Your shortness of breath gets worse.  You feel  light-headed, faint, or develop a cough not controlled with medicines.  You start coughing up blood.  You have pain with breathing.  You have chest pain or pain in your arms, shoulders, or abdomen.  You have a fever.  You are unable to walk up stairs or exercise the way you normally do. MAKE SURE YOU:  Understand these instructions.  Will watch your condition.  Will get help right away if you are not doing well or get worse.   This information is not intended to replace advice given to you by your health care provider. Make sure you discuss any questions you have with your health care provider.   Document Released: 01/26/2001 Document Revised: 05/08/2013 Document Reviewed: 07/19/2011 Elsevier Interactive Patient Education [employer]

## 2015-11-06 IMAGING — CR DG CHEST 2V
2 series · 2 of 2 positions shown · non-contrast
Comparison: 12/05/2014

CLINICAL DATA: Shortness of breath for 1 day

EXAM:
CHEST  2 VIEW

[x chest ap]
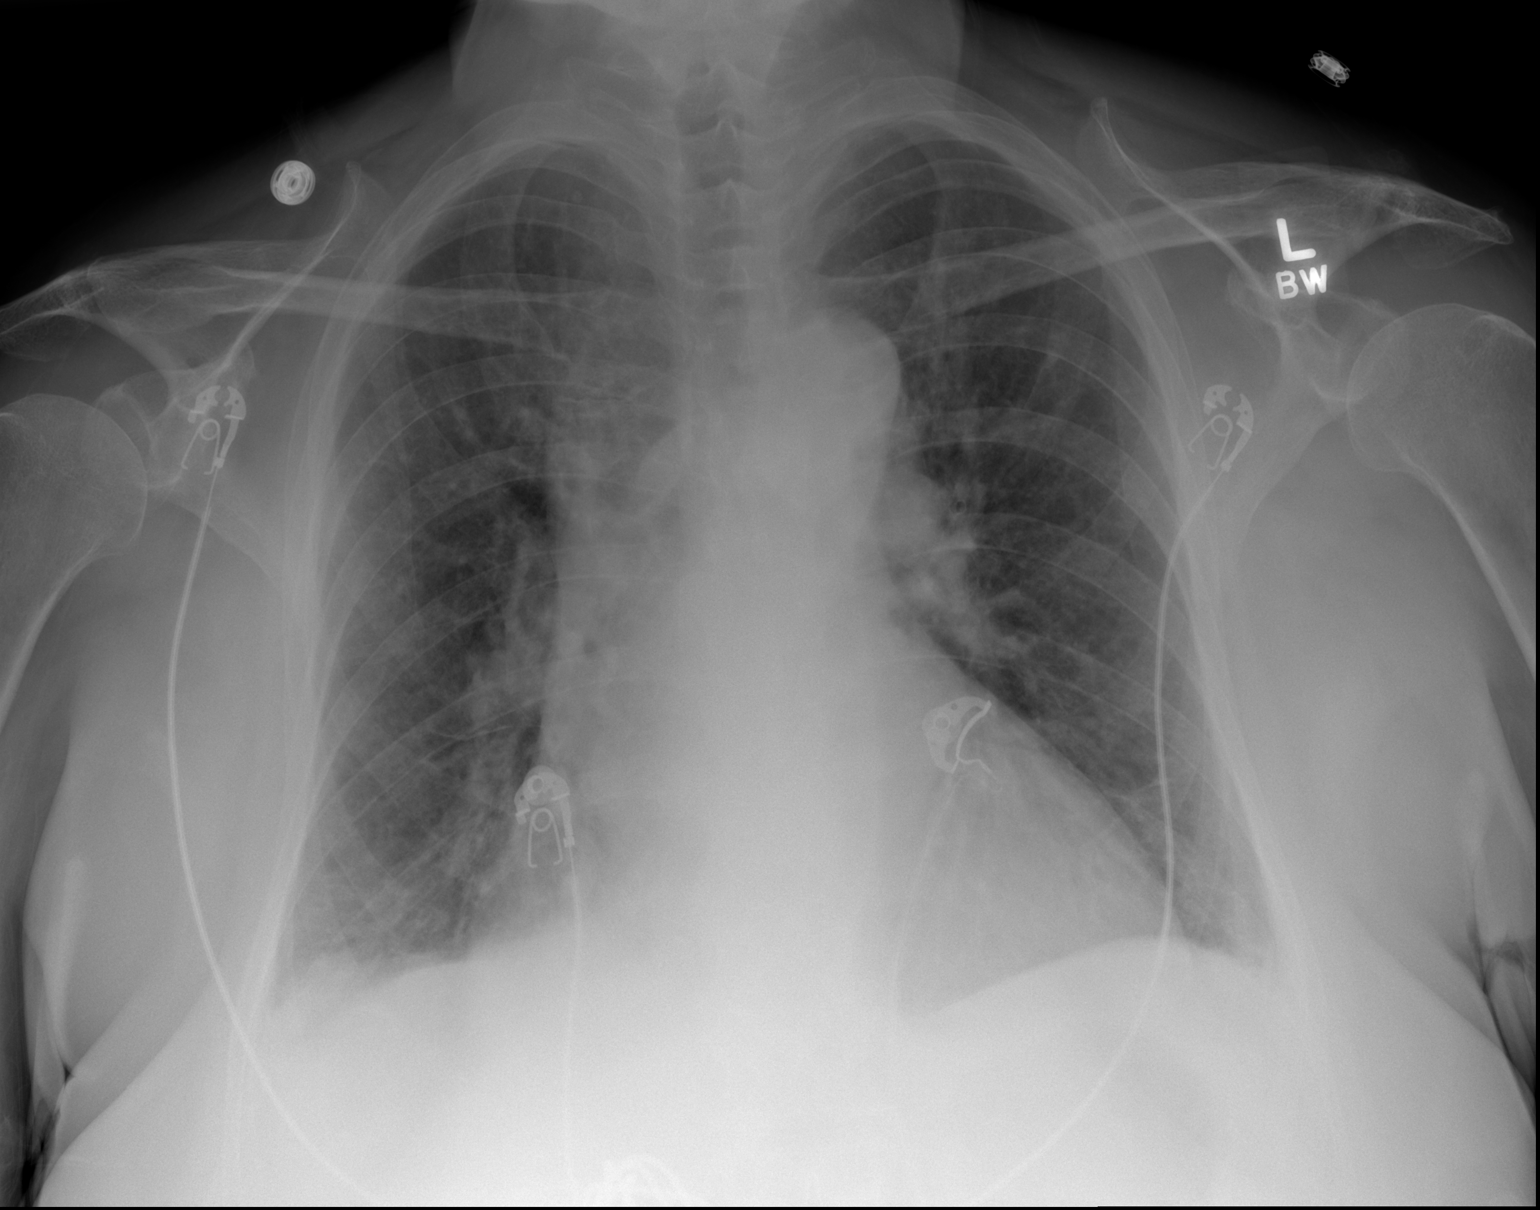

[w chest lat]
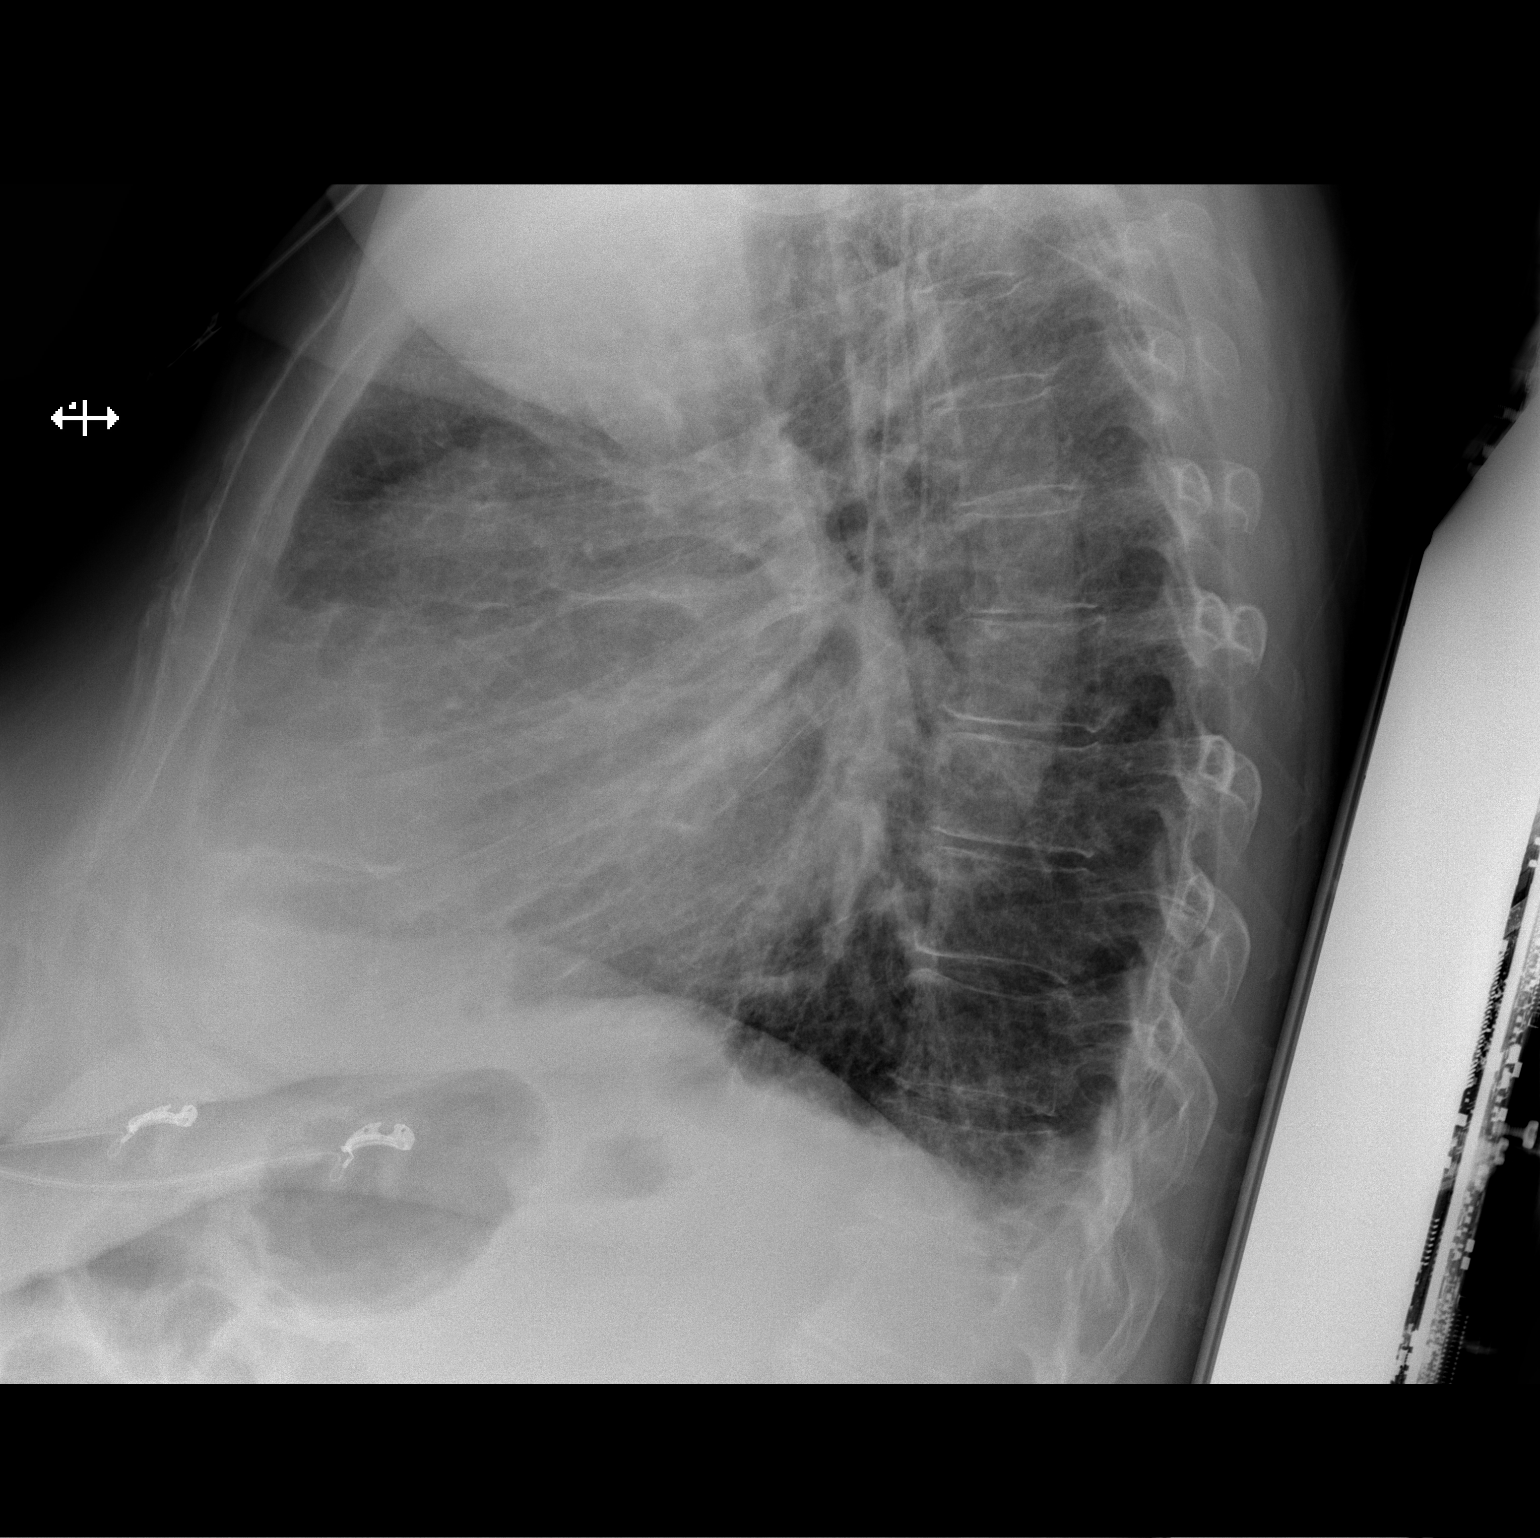

[2 of 2 positions shown; findings below may reference images not displayed]

FINDINGS: Moderate enlargement of the cardiac silhouette is noted with central
vascular congestion and possible early interstitial edema. No focal
pulmonary opacity. Trace pleural fluid. No acute osseous
abnormality. Stable prominence of the superior vascular pedicle.
IMPRESSION: Cardiomegaly with borderline interstitial edema and trace pleural
effusions.

## 2015-11-30 ENCOUNTER — Emergency Department (HOSPITAL_COMMUNITY): Payer: Medicare Other

## 2015-11-30 ENCOUNTER — Observation Stay (HOSPITAL_COMMUNITY)
Admission: EM | Admit: 2015-11-30 | Discharge: 2015-12-02 | Disposition: A | Discharge status: Home / Self Care | Payer: Medicare Other | Attending: Internal Medicine | Admitting: Internal Medicine

## 2015-11-30 ENCOUNTER — Encounter (HOSPITAL_COMMUNITY): Payer: Self-pay | Admitting: *Deleted

## 2015-11-30 DIAGNOSIS — E876 Hypokalemia: Secondary | ICD-10-CM | POA: Diagnosis present

## 2015-11-30 DIAGNOSIS — Z7982 Long term (current) use of aspirin: Secondary | ICD-10-CM | POA: Diagnosis not present

## 2015-11-30 DIAGNOSIS — E119 Type 2 diabetes mellitus without complications: Secondary | ICD-10-CM | POA: Diagnosis present

## 2015-11-30 DIAGNOSIS — E1165 Type 2 diabetes mellitus with hyperglycemia: Secondary | ICD-10-CM | POA: Diagnosis not present

## 2015-11-30 DIAGNOSIS — I959 Hypotension, unspecified: Secondary | ICD-10-CM | POA: Diagnosis not present

## 2015-11-30 DIAGNOSIS — R569 Unspecified convulsions: Secondary | ICD-10-CM

## 2015-11-30 DIAGNOSIS — T7840XA Allergy, unspecified, initial encounter: Secondary | ICD-10-CM | POA: Diagnosis present

## 2015-11-30 DIAGNOSIS — T781XXA Other adverse food reactions, not elsewhere classified, initial encounter: Secondary | ICD-10-CM | POA: Diagnosis not present

## 2015-11-30 DIAGNOSIS — I482 Chronic atrial fibrillation, unspecified: Secondary | ICD-10-CM | POA: Diagnosis present

## 2015-11-30 DIAGNOSIS — I481 Persistent atrial fibrillation: Secondary | ICD-10-CM | POA: Diagnosis not present

## 2015-11-30 DIAGNOSIS — X58XXXA Exposure to other specified factors, initial encounter: Secondary | ICD-10-CM | POA: Insufficient documentation

## 2015-11-30 DIAGNOSIS — T782XXA Anaphylactic shock, unspecified, initial encounter: Secondary | ICD-10-CM | POA: Diagnosis present

## 2015-11-30 DIAGNOSIS — E872 Acidosis, unspecified: Secondary | ICD-10-CM

## 2015-11-30 DIAGNOSIS — R000 Tachycardia, unspecified: Secondary | ICD-10-CM | POA: Insufficient documentation

## 2015-11-30 DIAGNOSIS — I5022 Chronic systolic (congestive) heart failure: Secondary | ICD-10-CM | POA: Diagnosis not present

## 2015-11-30 DIAGNOSIS — E861 Hypovolemia: Secondary | ICD-10-CM | POA: Diagnosis present

## 2015-11-30 DIAGNOSIS — Z794 Long term (current) use of insulin: Secondary | ICD-10-CM | POA: Diagnosis not present

## 2015-11-30 DIAGNOSIS — I4891 Unspecified atrial fibrillation: Secondary | ICD-10-CM | POA: Diagnosis present

## 2015-11-30 DIAGNOSIS — I480 Paroxysmal atrial fibrillation: Secondary | ICD-10-CM | POA: Diagnosis not present

## 2015-11-30 DIAGNOSIS — D72829 Elevated white blood cell count, unspecified: Secondary | ICD-10-CM

## 2015-11-30 DIAGNOSIS — I110 Hypertensive heart disease with heart failure: Secondary | ICD-10-CM | POA: Diagnosis not present

## 2015-11-30 DIAGNOSIS — I9589 Other hypotension: Secondary | ICD-10-CM

## 2015-11-30 DIAGNOSIS — Z880 Allergy status to penicillin: Secondary | ICD-10-CM | POA: Insufficient documentation

## 2015-11-30 DIAGNOSIS — I10 Essential (primary) hypertension: Secondary | ICD-10-CM | POA: Diagnosis present

## 2015-11-30 LAB — I-STAT CG4 LACTIC ACID, ED: Lactic Acid, Venous: 5.47 mmol/L (ref 0.5–1.9)

## 2015-11-30 LAB — TYPE AND SCREEN
ABO/RH(D): A POS
Antibody Screen: NEGATIVE

## 2015-11-30 LAB — I-STAT CHEM 8, ED
BUN: 20 mg/dL (ref 6–20)
Calcium, Ion: 1.05 mmol/L — ABNORMAL LOW (ref 1.12–1.23)
Chloride: 104 mmol/L (ref 101–111)
Creatinine, Ser: 0.9 mg/dL (ref 0.44–1.00)
Glucose, Bld: 371 mg/dL — ABNORMAL HIGH (ref 65–99)
HCT: 49 % — ABNORMAL HIGH (ref 36.0–46.0)
Hemoglobin: 16.7 g/dL — ABNORMAL HIGH (ref 12.0–15.0)
Potassium: 3.7 mmol/L (ref 3.5–5.1)
Sodium: 141 mmol/L (ref 135–145)
TCO2: 22 mmol/L (ref 0–100)

## 2015-11-30 LAB — COMPREHENSIVE METABOLIC PANEL
ALT: 14 U/L (ref 14–54)
AST: 27 U/L (ref 15–41)
Albumin: 3.6 g/dL (ref 3.5–5.0)
Alkaline Phosphatase: 49 U/L (ref 38–126)
Anion gap: 11 (ref 5–15)
BUN: 14 mg/dL (ref 6–20)
CO2: 19 mmol/L — ABNORMAL LOW (ref 22–32)
Calcium: 8.6 mg/dL — ABNORMAL LOW (ref 8.9–10.3)
Chloride: 108 mmol/L (ref 101–111)
Creatinine, Ser: 1.12 mg/dL — ABNORMAL HIGH (ref 0.44–1.00)
GFR calc Af Amer: 58 mL/min — ABNORMAL LOW (ref >60)
GFR calc non Af Amer: 50 mL/min — ABNORMAL LOW (ref >60)
Glucose, Bld: 368 mg/dL — ABNORMAL HIGH (ref 65–99)
Potassium: 3.2 mmol/L — ABNORMAL LOW (ref 3.5–5.1)
Sodium: 138 mmol/L (ref 135–145)
Total Bilirubin: 1.4 mg/dL — ABNORMAL HIGH (ref 0.3–1.2)
Total Protein: 7.1 g/dL (ref 6.5–8.1)

## 2015-11-30 LAB — PROTIME-INR
INR: 1.09 (ref 0.00–1.49)
Prothrombin Time: 14.3 seconds (ref 11.6–15.2)

## 2015-11-30 LAB — CBC
HCT: 46.9 % — ABNORMAL HIGH (ref 36.0–46.0)
Hemoglobin: 15.5 g/dL — ABNORMAL HIGH (ref 12.0–15.0)
MCH: 33.8 pg (ref 26.0–34.0)
MCHC: 33 g/dL (ref 30.0–36.0)
MCV: 102.2 fL — ABNORMAL HIGH (ref 78.0–100.0)
Platelets: 353 10*3/uL (ref 150–400)
RBC: 4.59 MIL/uL (ref 3.87–5.11)
RDW: 15.1 % (ref 11.5–15.5)
WBC: 29.7 10*3/uL — ABNORMAL HIGH (ref 4.0–10.5)

## 2015-11-30 LAB — I-STAT TROPONIN, ED: Troponin i, poc: 0 ng/mL (ref 0.00–0.08)

## 2015-11-30 MED ORDER — DILTIAZEM HCL 100 MG IV SOLR
5.0000 mg/h | INTRAVENOUS | Status: DC
  Administered 2015-11-30: 5 mg/h via INTRAVENOUS
  Filled 2015-11-30: qty 100

## 2015-11-30 MED ORDER — METHYLPREDNISOLONE SODIUM SUCC 125 MG IJ SOLR
125.0000 mg | Freq: Once | INTRAMUSCULAR | Status: AC
  Administered 2015-11-30: 125 mg via INTRAVENOUS
  Filled 2015-11-30: qty 2

## 2015-11-30 MED ORDER — SODIUM CHLORIDE 0.9 % IV BOLUS (SEPSIS)
1000.0000 mL | Freq: Once | INTRAVENOUS | Status: AC
  Administered 2015-11-30: 1000 mL via INTRAVENOUS

## 2015-11-30 MED ORDER — DILTIAZEM LOAD VIA INFUSION
20.0000 mg | Freq: Once | INTRAVENOUS | Status: AC
  Administered 2015-11-30: 20 mg via INTRAVENOUS
  Filled 2015-11-30: qty 20
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
## 2015-11-30 NOTE — ED Notes (Addendum)
Pt to ED from home by EMS c/o possible allergic reaction. Pt ate canned peas around 6pm; began to feel short of breath, dizziness, and itchy. On EMS arrival, pt was pale, diaphoretic, no palpable radial pulses, and hives to chest.   Pulse rate 132 in afib rvr, blood pressure unreadable. EMS gave 0.3 epi IM at 2130 then 2158 after no improvement of blood pressure. EMS also gave 50mg  benadryl PO, 4 mg zofran, and 5mg  Albuterol treatment for wheezing in upper lobe. Pt speaking in full sentences on arrival

## 2015-11-30 NOTE — ED Notes (Signed)
Provider at bedside

## 2015-12-01 DIAGNOSIS — E872 Acidosis: Secondary | ICD-10-CM

## 2015-12-01 DIAGNOSIS — E1165 Type 2 diabetes mellitus with hyperglycemia: Secondary | ICD-10-CM | POA: Insufficient documentation

## 2015-12-01 DIAGNOSIS — I5022 Chronic systolic (congestive) heart failure: Secondary | ICD-10-CM

## 2015-12-01 DIAGNOSIS — T7840XA Allergy, unspecified, initial encounter: Secondary | ICD-10-CM

## 2015-12-01 DIAGNOSIS — Z794 Long term (current) use of insulin: Secondary | ICD-10-CM

## 2015-12-01 DIAGNOSIS — E876 Hypokalemia: Secondary | ICD-10-CM

## 2015-12-01 DIAGNOSIS — I4891 Unspecified atrial fibrillation: Secondary | ICD-10-CM | POA: Diagnosis not present

## 2015-12-01 DIAGNOSIS — E861 Hypovolemia: Secondary | ICD-10-CM | POA: Diagnosis present

## 2015-12-01 DIAGNOSIS — I9589 Other hypotension: Secondary | ICD-10-CM

## 2015-12-01 DIAGNOSIS — T7840XS Allergy, unspecified, sequela: Secondary | ICD-10-CM

## 2015-12-01 DIAGNOSIS — D72829 Elevated white blood cell count, unspecified: Secondary | ICD-10-CM | POA: Insufficient documentation

## 2015-12-01 DIAGNOSIS — T782XXA Anaphylactic shock, unspecified, initial encounter: Secondary | ICD-10-CM | POA: Diagnosis present

## 2015-12-01 DIAGNOSIS — I10 Essential (primary) hypertension: Secondary | ICD-10-CM | POA: Diagnosis not present

## 2015-12-01 LAB — LACTIC ACID, PLASMA
Lactic Acid, Venous: 3.6 mmol/L (ref 0.5–1.9)
Lactic Acid, Venous: 4.9 mmol/L (ref 0.5–1.9)

## 2015-12-01 LAB — URINALYSIS, ROUTINE W REFLEX MICROSCOPIC
Bilirubin Urine: NEGATIVE
Glucose, UA: >1000 mg/dL — AB
Hgb urine dipstick: NEGATIVE
Ketones, ur: 15 mg/dL — AB
Nitrite: NEGATIVE
Protein, ur: NEGATIVE mg/dL
Specific Gravity, Urine: 1.037 — ABNORMAL HIGH (ref 1.005–1.030)
pH: 5 (ref 5.0–8.0)

## 2015-12-01 LAB — BASIC METABOLIC PANEL
Anion gap: 10 (ref 5–15)
BUN: 17 mg/dL (ref 6–20)
CO2: 20 mmol/L — ABNORMAL LOW (ref 22–32)
Calcium: 8.3 mg/dL — ABNORMAL LOW (ref 8.9–10.3)
Chloride: 104 mmol/L (ref 101–111)
Creatinine, Ser: 1.22 mg/dL — ABNORMAL HIGH (ref 0.44–1.00)
GFR calc Af Amer: 53 mL/min — ABNORMAL LOW (ref >60)
GFR calc non Af Amer: 45 mL/min — ABNORMAL LOW (ref >60)
Glucose, Bld: 379 mg/dL — ABNORMAL HIGH (ref 65–99)
Potassium: 4.4 mmol/L (ref 3.5–5.1)
Sodium: 134 mmol/L — ABNORMAL LOW (ref 135–145)

## 2015-12-01 LAB — URINE MICROSCOPIC-ADD ON

## 2015-12-01 LAB — MAGNESIUM: Magnesium: 1.8 mg/dL (ref 1.7–2.4)

## 2015-12-01 LAB — CBC
HCT: 41.5 % (ref 36.0–46.0)
Hemoglobin: 13.6 g/dL (ref 12.0–15.0)
MCH: 33.4 pg (ref 26.0–34.0)
MCHC: 32.8 g/dL (ref 30.0–36.0)
MCV: 102 fL — ABNORMAL HIGH (ref 78.0–100.0)
Platelets: 194 10*3/uL (ref 150–400)
RBC: 4.07 MIL/uL (ref 3.87–5.11)
RDW: 15.3 % (ref 11.5–15.5)
WBC: 18.4 10*3/uL — ABNORMAL HIGH (ref 4.0–10.5)

## 2015-12-01 LAB — I-STAT CG4 LACTIC ACID, ED: Lactic Acid, Venous: 4.71 mmol/L (ref 0.5–1.9)

## 2015-12-01 LAB — DIGOXIN LEVEL: Digoxin Level: 0.3 ng/mL — ABNORMAL LOW (ref 0.8–2.0)

## 2015-12-01 LAB — MRSA PCR SCREENING: MRSA by PCR: NEGATIVE

## 2015-12-01 LAB — GLUCOSE, CAPILLARY
Glucose-Capillary: 422 mg/dL — ABNORMAL HIGH (ref 65–99)
Glucose-Capillary: 323 mg/dL — ABNORMAL HIGH (ref 65–99)
Glucose-Capillary: 357 mg/dL — ABNORMAL HIGH (ref 65–99)

## 2015-12-01 LAB — ABO/RH: ABO/RH(D): A POS

## 2015-12-01 LAB — CBG MONITORING, ED
Glucose-Capillary: 386 mg/dL — ABNORMAL HIGH (ref 65–99)
Glucose-Capillary: 423 mg/dL — ABNORMAL HIGH (ref 65–99)

## 2015-12-01 LAB — BRAIN NATRIURETIC PEPTIDE: B Natriuretic Peptide: 94.4 pg/mL (ref 0.0–100.0)

## 2015-12-01 MED ORDER — POTASSIUM CHLORIDE 20 MEQ/15ML (10%) PO SOLN
40.0000 meq | Freq: Once | ORAL | Status: AC
  Administered 2015-12-01: 40 meq via ORAL
  Filled 2015-12-01: qty 30

## 2015-12-01 MED ORDER — METOPROLOL SUCCINATE ER 25 MG PO TB24
50.0000 mg | ORAL_TABLET | Freq: Two times a day (BID) | ORAL | Status: DC
  Administered 2015-12-01 – 2015-12-02 (×3): 50 mg via ORAL
  Filled 2015-12-01 (×3): qty 2

## 2015-12-01 MED ORDER — DIPHENHYDRAMINE HCL 25 MG PO CAPS
25.0000 mg | ORAL_CAPSULE | Freq: Three times a day (TID) | ORAL | Status: DC
  Administered 2015-12-01 – 2015-12-02 (×5): 25 mg via ORAL
  Filled 2015-12-01 (×5): qty 1

## 2015-12-01 MED ORDER — INSULIN GLARGINE 100 UNIT/ML ~~LOC~~ SOLN
10.0000 [IU] | Freq: Every day | SUBCUTANEOUS | Status: DC
  Administered 2015-12-01 – 2015-12-02 (×2): 10 [IU] via SUBCUTANEOUS
  Filled 2015-12-01 (×2): qty 0.1

## 2015-12-01 MED ORDER — DILTIAZEM HCL ER COATED BEADS 240 MG PO CP24
240.0000 mg | ORAL_CAPSULE | Freq: Every day | ORAL | Status: DC
  Administered 2015-12-01 – 2015-12-02 (×2): 240 mg via ORAL
  Filled 2015-12-01 (×2): qty 1

## 2015-12-01 MED ORDER — ENOXAPARIN SODIUM 60 MG/0.6ML ~~LOC~~ SOLN
60.0000 mg | SUBCUTANEOUS | Status: DC
  Filled 2015-12-01 (×3): qty 0.6

## 2015-12-01 MED ORDER — SODIUM CHLORIDE 0.9% FLUSH
3.0000 mL | Freq: Two times a day (BID) | INTRAVENOUS | Status: DC
  Administered 2015-12-01 – 2015-12-02 (×3): 3 mL via INTRAVENOUS

## 2015-12-01 MED ORDER — INSULIN ASPART 100 UNIT/ML ~~LOC~~ SOLN
0.0000 [IU] | Freq: Three times a day (TID) | SUBCUTANEOUS | Status: DC
  Administered 2015-12-01: 7 [IU] via SUBCUTANEOUS
  Administered 2015-12-01 (×2): 9 [IU] via SUBCUTANEOUS
  Administered 2015-12-02: 7 [IU] via SUBCUTANEOUS
  Filled 2015-12-01: qty 1

## 2015-12-01 MED ORDER — EPINEPHRINE 0.3 MG/0.3ML IJ SOAJ
0.3000 mg | INTRAMUSCULAR | Status: DC | PRN
  Filled 2015-12-01: qty 0.3

## 2015-12-01 MED ORDER — FAMOTIDINE 20 MG PO TABS
20.0000 mg | ORAL_TABLET | Freq: Two times a day (BID) | ORAL | Status: DC
  Administered 2015-12-01 – 2015-12-02 (×4): 20 mg via ORAL
  Filled 2015-12-01 (×4): qty 1

## 2015-12-01 MED ORDER — SODIUM CHLORIDE 0.9 % IV SOLN
INTRAVENOUS | Status: DC
  Administered 2015-12-01 (×2): via INTRAVENOUS

## 2015-12-01 MED ORDER — METHYLPREDNISOLONE SODIUM SUCC 125 MG IJ SOLR
60.0000 mg | INTRAMUSCULAR | Status: DC
  Administered 2015-12-02: 60 mg via INTRAVENOUS
  Filled 2015-12-01: qty 2

## 2015-12-01 MED ORDER — ASPIRIN 325 MG PO TABS
325.0000 mg | ORAL_TABLET | ORAL | Status: DC
  Administered 2015-12-01: 325 mg via ORAL
  Filled 2015-12-01: qty 1

## 2015-12-01 MED ORDER — DIGOXIN 125 MCG PO TABS
0.1250 mg | ORAL_TABLET | Freq: Every day | ORAL | Status: DC
  Administered 2015-12-01 – 2015-12-02 (×2): 0.125 mg via ORAL
  Filled 2015-12-01 (×2): qty 1

## 2015-12-01 MED ORDER — ACETAMINOPHEN 650 MG RE SUPP
650.0000 mg | Freq: Four times a day (QID) | RECTAL | Status: DC | PRN
Start: 2015-12-01 — End: 2015-12-02

## 2015-12-01 MED ORDER — ACETAMINOPHEN 325 MG PO TABS: 650.0000 mg | ORAL_TABLET | Freq: Four times a day (QID) | ORAL | Status: DC | PRN

## 2015-12-01 MED ORDER — METHYLPREDNISOLONE SODIUM SUCC 125 MG IJ SOLR
60.0000 mg | Freq: Two times a day (BID) | INTRAMUSCULAR | Status: DC
  Administered 2015-12-01: 60 mg via INTRAVENOUS
  Filled 2015-12-01: qty 2

## 2015-12-01 MED ORDER — INSULIN ASPART 100 UNIT/ML ~~LOC~~ SOLN
0.0000 [IU] | Freq: Every day | SUBCUTANEOUS | Status: DC
  Administered 2015-12-01 (×2): 5 [IU] via SUBCUTANEOUS
  Filled 2015-12-01: qty 1

## 2015-12-01 MED ORDER — INSULIN ASPART 100 UNIT/ML ~~LOC~~ SOLN
3.0000 [IU] | Freq: Three times a day (TID) | SUBCUTANEOUS | Status: DC
  Administered 2015-12-01 – 2015-12-02 (×3): 3 [IU] via SUBCUTANEOUS
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
## 2015-12-01 NOTE — Progress Notes (Signed)
Inpatient Diabetes Program Recommendations  AACE/ADA: New Consensus Statement on Inpatient Glycemic Control (2015)  Target Ranges:  Prepandial:   less than 140 mg/dL      Peak postprandial:   less than 180 mg/dL (1-2 hours)      Critically ill patients:  140 - 180 mg/dL  Results for ANIJA, REINDEL (MRN [id number]) as of 12/01/2015 10:59  Ref. Range 12/01/2015 03:24 12/01/2015 07:51  Glucose-Capillary Latest Ref Range: 65-99 mg/dL 580 (H) 998 (H)    Review of Glycemic Control  Diabetes history: DM2 Outpatient Diabetes medications: None Current orders for Inpatient glycemic control: Novolog 0-9 units TID with meals, Novolog 0-5 units QHS  Inpatient Diabetes Program Recommendations: Insulin - Basal: Initial glucose 368 mg/dl and fasting glucose 338 mg/dl this morning. Please consider ordering Lantus 10 units Q24H starting now. Correction (SSI): Please consider increasing Novolog correction to moderate scale. Insulin - Meal Coverage: If steroids are continued, please consider ordering Novolog 3 units TID with meals for meal coverage. HgbA1C: A1C in process.  Thanks, Orlando Penner, RN, MSN, CDE Diabetes Coordinator Inpatient Diabetes Program [phone] (Team Pager from 8am to 5pm) [phone] (AP office) [phone] [facility] office) [phone] [facility] office)

## 2015-12-01 NOTE — Progress Notes (Addendum)
Text page Dr Elisabeth Pigeon - notified of UA results in chart also reminded of elevated Lactic Acid results from 0850 this am .  Rewived the results. Follow up lactic acid in am  Manson Passey

## 2015-12-01 NOTE — ED Notes (Signed)
Attempted to get pt temp but unable to due to pt had just had ice

## 2015-12-01 NOTE — Progress Notes (Signed)
Pt had meds with her upon admission - inventory sheet done and pt's meds sent to pharmacy until time of discharge

## 2015-12-01 NOTE — Progress Notes (Signed)
Text page to Dr Elisabeth Pigeon regarding elevated Lactic acid and UA results - no orders received

## 2015-12-01 NOTE — Care Management Obs Status (Signed)
MEDICARE OBSERVATION STATUS NOTIFICATION   Patient Details  Name: Kaitlyn [name] MRN: [id number] Date of Birth: [birth year]   Medicare Observation Status Notification Given:       Hanley Hays, RN 12/01/2015, 1:42 PM

## 2015-12-01 NOTE — ED Notes (Signed)
MD notified of CBG >400, advised to give 9 units novolog.

## 2015-12-01 NOTE — Progress Notes (Addendum)
Received pt from ED per stretcher. Oriented to room. Alert/oriented. CHG bath and placed on monitor. Cardizem gtt at 5/NS at 75 cc/hr

## 2015-12-01 NOTE — Progress Notes (Signed)
   12/01/15 1200  Clinical Encounter Type  Visited With Patient  Visit Type Initial;Psychological support;Social support  Spiritual Encounters  Spiritual Needs Emotional  Stress Factors  Patient Stress Factors Financial concerns;Health changes  Family Stress Factors None identified   Chaplain doing rounds on floor and visited with patient.  Patient did life review with Chaplain.  Patient has a lot of social service needs and concerns right now.  Caseworker is aware of situation.  Chaplain offered ministry of presence to patient.  Rosezella [place] 12/01/2015 12:19 PM

## 2015-12-01 NOTE — Progress Notes (Signed)
Patient ID: Kaitlyn [name], female   DOB: February 26, 1951, 65 y.o.   MRN: [id number]  Patient admitted after midnight. For details please refer to admission note done 12/01/2015.   65 y.o. female with past medical history significant for hypertension, diabetes, systolic congestive heart failure with last ejection fraction 20-25% in May 2016, history of atrial fibrillation but not on anticoagulation, seizure disorder who presented with allergic reaction. Patient apparently had canned peas about 6 PM prior to the admission and shortly thereafter developed dizziness, lightheadedness, diaphoresis and diarrhea. Upon EMS arrival, patient was tachycardic with heart rate in 130s, good blood pressure. She was given 2 APs, Benadryl, Zofran and albuterol treatment and she gradually recovered.  In ED, blood pressure was as low as 70/50 but with fluids it has improved to 140/82. Heart rate was 116, respiration 16-28. Blood work on the admission showed white blood cell count 29.7, hemoglobin 15.5, potassium 3.2, creatinine 1.12 and glucose 368. Chest x-ray showed no evidence of acute disease. Her 12-lead EKG showed atrial fibrillation with rapid ventricular rate for which reason she was started on Cardizem drip. She was given Solu-Medrol on the admission, Pepcid and Benadryl.  Assessment and plan  Acute allergic reaction - Likely secondary to canned peas ingestion - Patient is hemodynamically stable, no swelling in the throat, no obvious rash - She is able to protect her airways without any difficulty - We will switch Solu-Medrol to once a day regimen - Continue Benadryl and Pepcid scheduled  Atrial fibrillation with RVR - CHADS vasc score 5 (age, gender, htn, chf and DM) - Patient is not on anticoagulation other than aspirin - Continue digoxin, cardizem and metoprolol for heart rate control - Her heart rate is stable so will stop Cardizem drip  Essential hypertension - Continue Cardizem and metoprolol  Manson Passey [facility] [phone]

## 2015-12-01 NOTE — Care Management Obs Status (Signed)
MEDICARE OBSERVATION STATUS NOTIFICATION   Patient Details  Name: Kaitlyn [name] MRN: [id number] Date of Birth: January 14, 1951   Medicare Observation Status Notification Given:  Yes    Hanley Hays, RN 12/01/2015, 1:56 PM

## 2015-12-02 ENCOUNTER — Encounter (HOSPITAL_COMMUNITY): Payer: Self-pay | Admitting: General Practice

## 2015-12-02 DIAGNOSIS — I482 Chronic atrial fibrillation: Secondary | ICD-10-CM | POA: Diagnosis not present

## 2015-12-02 DIAGNOSIS — I10 Essential (primary) hypertension: Secondary | ICD-10-CM | POA: Diagnosis not present

## 2015-12-02 DIAGNOSIS — T7840XS Allergy, unspecified, sequela: Secondary | ICD-10-CM | POA: Diagnosis not present

## 2015-12-02 LAB — HEMOGLOBIN A1C
Hgb A1c MFr Bld: 8 % — ABNORMAL HIGH (ref 4.8–5.6)
Mean Plasma Glucose: 183 mg/dL

## 2015-12-02 LAB — BASIC METABOLIC PANEL
Anion gap: 10 (ref 5–15)
BUN: 20 mg/dL (ref 6–20)
CO2: 19 mmol/L — ABNORMAL LOW (ref 22–32)
Calcium: 9.2 mg/dL (ref 8.9–10.3)
Chloride: 106 mmol/L (ref 101–111)
Creatinine, Ser: 1.22 mg/dL — ABNORMAL HIGH (ref 0.44–1.00)
GFR calc Af Amer: 53 mL/min — ABNORMAL LOW (ref >60)
GFR calc non Af Amer: 45 mL/min — ABNORMAL LOW (ref >60)
Glucose, Bld: 328 mg/dL — ABNORMAL HIGH (ref 65–99)
Potassium: 4.2 mmol/L (ref 3.5–5.1)
Sodium: 135 mmol/L (ref 135–145)

## 2015-12-02 LAB — GLUCOSE, CAPILLARY: Glucose-Capillary: 318 mg/dL — ABNORMAL HIGH (ref 65–99)

## 2015-12-02 LAB — CBC
HCT: 38.1 % (ref 36.0–46.0)
Hemoglobin: 12.3 g/dL (ref 12.0–15.0)
MCH: 33.2 pg (ref 26.0–34.0)
MCHC: 32.3 g/dL (ref 30.0–36.0)
MCV: 102.7 fL — ABNORMAL HIGH (ref 78.0–100.0)
Platelets: 203 10*3/uL (ref 150–400)
RBC: 3.71 MIL/uL — ABNORMAL LOW (ref 3.87–5.11)
RDW: 15.5 % (ref 11.5–15.5)
WBC: 20 10*3/uL — ABNORMAL HIGH (ref 4.0–10.5)

## 2015-12-02 LAB — URINE CULTURE

## 2015-12-02 MED ORDER — METOPROLOL SUCCINATE ER 50 MG PO TB24: 50.0000 mg | ORAL_TABLET | Freq: Two times a day (BID) | ORAL | Status: DC

## 2015-12-02 MED ORDER — FAMOTIDINE 20 MG PO TABS: 20.0000 mg | ORAL_TABLET | Freq: Every day | ORAL | Status: DC

## 2015-12-02 MED ORDER — DILTIAZEM HCL ER COATED BEADS 240 MG PO CP24
240.0000 mg | ORAL_CAPSULE | Freq: Every day | ORAL | Status: DC
Start: 2015-12-02 — End: 2016-07-28

## 2015-12-02 NOTE — Progress Notes (Signed)
   12/02/15 0900  Clinical Encounter Type  Visited With Patient  Visit Type Initial;Social support  Spiritual Encounters  Spiritual Needs Literature  Stress Factors  Patient Stress Factors Financial concerns   Chaplain visited with patient. Chaplain gave patient copy of "[employer] in [place]".    Rosezella [place] Laniesha Das 12/02/2015 9:07 AM

## 2015-12-02 NOTE — Progress Notes (Signed)
Pt. Educated and given discharge paperwork, signed copy given to her and one in chart. Pt. Reported no s/s of distress. Patient discharged by transport service.

## 2015-12-02 NOTE — Progress Notes (Signed)
Patient will DC to: Home Anticipated DC date: 12/02/15 Family notified: n/a Transport by: [facility]   DC packet on chart. Ambulance transport requested for patient.   CSW signing off.  Cristobal Goldmann, [place] Clinical Social Worker [phone]

## 2015-12-02 NOTE — Discharge Instructions (Signed)
Allergies An allergy is an abnormal reaction to a substance by the body's defense system (immune system). Allergies can develop at any age. WHAT CAUSES ALLERGIES? An allergic reaction happens when the immune system mistakenly reacts to a normally harmless substance, called an allergen, as if it were harmful. The immune system releases antibodies to fight the substance. Antibodies eventually release a chemical called histamine into the bloodstream. The release of histamine is meant to protect the body from infection, but it also causes discomfort. An allergic reaction can be triggered by:  Eating an allergen.  Inhaling an allergen.  Touching an allergen. WHAT TYPES OF ALLERGIES ARE THERE? There are many types of allergies. Common types include:  Seasonal allergies. People with this type of allergy are usually allergic to substances that are only present during certain seasons, such as molds and pollens.  Food allergies.  Drug allergies.  Insect allergies.  Animal dander allergies. WHAT ARE SYMPTOMS OF ALLERGIES? Possible allergy symptoms include:  Swelling of the lips, face, tongue, mouth, or throat.  Sneezing, coughing, or wheezing.  Nasal congestion.  Tingling in the mouth.  Rash.  Itching.  Itchy, red, swollen areas of skin (hives).  Watery eyes.  Vomiting.  Diarrhea.  Dizziness.  Lightheadedness.  Fainting.  Trouble breathing or swallowing.  Chest tightness.  Rapid heartbeat. HOW ARE ALLERGIES DIAGNOSED? Allergies are diagnosed with a medical and family history and one or more of the following:  Skin tests.  Blood tests.  A food diary. A food diary is a record of all the foods and drinks you have in a day and of all the symptoms you experience.  The results of an elimination diet. An elimination diet involves eliminating foods from your diet and then adding them back in one by one to find out if a certain food causes an allergic reaction. HOW ARE  ALLERGIES TREATED? There is no cure for allergies, but allergic reactions can be treated with medicine. Severe reactions usually need to be treated at a hospital. HOW CAN REACTIONS BE PREVENTED? The best way to prevent an allergic reaction is by avoiding the substance you are allergic to. Allergy shots and medicines can also help prevent reactions in some cases. People with severe allergic reactions may be able to prevent a life-threatening reaction called anaphylaxis with a medicine given right after exposure to the allergen.   This information is not intended to replace advice given to you by your health care provider. Make sure you discuss any questions you have with your health care provider.   Document Released: 07/27/2002 Document Revised: 05/24/2014 Document Reviewed: 02/12/2014 Elsevier Interactive Patient Education 2016 [place].  Anaphylactic Reaction An anaphylactic reaction is a sudden, severe allergic reaction that involves the whole body. It can be life threatening. A hospital stay is often required. People with asthma, eczema, or hay fever are slightly more likely to have an anaphylactic reaction. CAUSES  An anaphylactic reaction may be caused by anything to which you are allergic. After being exposed to the allergic substance, your immune system becomes sensitized to it. When you are exposed to that allergic substance again, an allergic reaction can occur. Common causes of an anaphylactic reaction include:  Medicines.  Foods, especially peanuts, wheat, shellfish, milk, and eggs.  Insect bites or stings.  Blood products.  Chemicals, such as dyes, latex, and contrast material used for imaging tests. SYMPTOMS  When an allergic reaction occurs, the body releases histamine and other substances. These substances cause symptoms such as tightening  of the airway. Symptoms often develop within seconds or minutes of exposure. Symptoms may include:  Skin rash or  hives.  Itching.  Chest tightness.  Swelling of the eyes, tongue, or lips.  Trouble breathing or swallowing.  Lightheadedness or fainting.  Anxiety or confusion.  Stomach pains, vomiting, or diarrhea.  Nasal congestion.  A fast or irregular heartbeat (palpitations). DIAGNOSIS  Diagnosis is based on your history of recent exposure to allergic substances, your symptoms, and a physical exam. Your caregiver may also perform blood or urine tests to confirm the diagnosis. TREATMENT  Epinephrine medicine is the main treatment for an anaphylactic reaction. Other medicines that may be used for treatment include antihistamines, steroids, and albuterol. In severe cases, fluids and medicine to support blood pressure may be given through an intravenous line (IV). Even if you improve after treatment, you need to be observed to make sure your condition does not get worse. This may require a stay in the hospital. [place] a medical alert bracelet or necklace stating your allergy.  You and your family must learn how to use an anaphylaxis kit or give an epinephrine injection to temporarily treat an emergency allergic reaction. Always carry your epinephrine injection or anaphylaxis kit with you. This can be lifesaving if you have a severe reaction.  Do not drive or perform tasks after treatment until the medicines used to treat your reaction have worn off, or until your caregiver says it is okay.  If you have hives or a rash:  Take medicines as directed by your caregiver.  You may use an over-the-counter antihistamine (diphenhydramine) as needed.  Apply cold compresses to the skin or take baths in cool water. Avoid hot baths or showers. SEEK MEDICAL CARE IF:   You develop symptoms of an allergic reaction to a new substance. Symptoms may start right away or minutes later.  You develop a rash, hives, or itching.  You develop new symptoms. SEEK IMMEDIATE MEDICAL CARE IF:    You have swelling of the mouth, difficulty breathing, or wheezing.  You have a tight feeling in your chest or throat.  You develop hives, swelling, or itching all over your body.  You develop severe vomiting or diarrhea.  You feel faint or pass out. This is an emergency. Use your epinephrine injection or anaphylaxis kit as you have been instructed. Call your local emergency services (911 in [place] Even if you improve after the injection, you need to be examined at a hospital emergency department. MAKE SURE YOU:   Understand these instructions.  Will watch your condition.  Will get help right away if you are not doing well or get worse.   This information is not intended to replace advice given to you by your health care provider. Make sure you discuss any questions you have with your health care provider.   Document Released: 05/03/2005 Document Revised: 05/08/2013 Document Reviewed: 11/13/2014 Elsevier Interactive Patient Education [employer].

## 2015-12-02 NOTE — Progress Notes (Signed)
Inpatient Diabetes Program Recommendations  AACE/ADA: New Consensus Statement on Inpatient Glycemic Control (2015)  Target Ranges:  Prepandial:   less than 140 mg/dL      Peak postprandial:   less than 180 mg/dL (1-2 hours)      Critically ill patients:  140 - 180 mg/dL  Results for Kaitlyn [name], Kaitlyn [name] (MRN [id number]) as of 12/02/2015 09:14  Ref. Range 12/01/2015 07:51 12/01/2015 11:38 12/01/2015 16:24 12/01/2015 21:26 12/02/2015 07:21  Glucose-Capillary Latest Ref Range: 65-99 mg/dL 034 (H) 035 (H) 248 (H) 357 (H) 318 (H)   Results for SHARONANN, POLLICK (MRN [id number]) as of 12/02/2015 09:14  Ref. Range 12/01/2015 04:08  Hemoglobin A1C Latest Ref Range: 4.8-5.6 % 8.0 (H)   Review of Glycemic Control  Diabetes history: DM2 Outpatient Diabetes medications: None Current orders for Inpatient glycemic control: Lantus 10 units daily, Novolog 3 units TID with meals for meal coverage, Novolog 0-9 units TID with meals, Novolog 0-5 units QHS  Inpatient Diabetes Program Recommendations: Insulin - Basal: Fasting glucose 318 mg/dl. Please consider increasing Lantus to 15 units daily (also consider ordering one time dose of Lantus 5 x 1 now since patient has already received 10 units this morning). Correction (SSI): Please consider increasing Novolog correction to moderate scale. Insulin - Meal Coverage: If steroids are continued, please consider increasing meal coverage to 10 units TID with meals. HgbA1C: A1C 8.0% on 12/01/15. May want to consider discharging patient on oral DM medication and have patient follow up with PCP regarding glycemic control.  Thanks, Orlando Penner, RN, MSN, CDE Diabetes Coordinator Inpatient Diabetes Program [phone] (Team Pager from 8am to 5pm) [phone] ([facility] office) [phone] [facility] office) [phone] [facility] office)

## 2015-12-02 NOTE — Discharge Summary (Signed)
Physician Discharge Summary  Kaitlyn [name]:[id number] DOB: 1950-09-19 DOA: 11/30/2015  PCP: No PCP Per Patient  Admit date: 11/30/2015 Discharge date: 12/02/2015  Recommendations for Outpatient Follow-up:  1. Follow-up with primary care physician in one to 2 weeks after discharge to make sure symptoms are stable.  Discharge Diagnoses:  Principal Problem:   Allergic reaction Active Problems:   Chronic atrial fibrillation (HCC)   Essential hypertension   Chronic systolic CHF (congestive heart failure) (HCC)   Diabetes type 2, uncontrolled (HCC)   Seizures (HCC)   Hypokalemia   Atrial fibrillation with rapid ventricular response (HCC)   Lactic acidosis   Hypotension   Anaphylactic reaction    Discharge Condition: stable   Diet recommendation: as tolerated   History of present illness:  65 y.o. female with past medical history significant for hypertension, diabetes, systolic congestive heart failure with last ejection fraction 20-25% in May 2016, history of atrial fibrillation but not on anticoagulation, seizure disorder who presented with allergic reaction. Patient apparently had canned peas about 6 PM prior to the admission and shortly thereafter developed dizziness, lightheadedness, diaphoresis and diarrhea. Upon EMS arrival, patient was tachycardic with heart rate in 130s, good blood pressure. She was given 2 APs, Benadryl, Zofran and albuterol treatment and she gradually recovered.  In ED, blood pressure was as low as 70/50 but with fluids it has improved to 140/82. Heart rate was 116, respiration 16-28. Blood work on the admission showed white blood cell count 29.7, hemoglobin 15.5, potassium 3.2, creatinine 1.12 and glucose 368. Chest x-ray showed no evidence of acute disease. Her 12-lead EKG showed atrial fibrillation with rapid ventricular rate for which reason she was started on Cardizem drip. She was given Solu-Medrol on the admission, Pepcid and Benadryl.  Hospital  Course:   Assessment and plan  Acute allergic reaction - Likely secondary to canned peas ingestion - Patient is hemodynamically stable, no swelling in the throat, no obvious rash - Tolerates regular diet - Stop steroids today - May continue pepcid and benadryl as needed on discharge  Atrial fibrillation with RVR - CHADS vasc score 5 (age, gender, htn, chf and DM) - Patient is not on anticoagulation other than aspirin - Continue digoxin, cardizem and metoprolol for heart rate control  Essential hypertension - Continue Cardizem and metoprolol   Signed:  Manson Passey, MD  [facility] 12/02/2015, 11:21 AM  Pager #: [phone]    Discharge Exam: Filed Vitals:   12/02/15 0330 12/02/15 0728  BP: 150/73 169/91  Pulse: 81 78  Temp: 98.5 F (36.9 C) 97.8 F (36.6 C)  Resp: 20 21   Filed Vitals:   12/01/15 1945 12/01/15 2313 12/02/15 0330 12/02/15 0728  BP: 137/81 132/72 150/73 169/91  Pulse: 89 76 81 78  Temp: 98.5 F (36.9 C) 98.6 F (37 C) 98.5 F (36.9 C) 97.8 F (36.6 C)  TempSrc: Oral Oral Oral Oral  Resp: Height:      Weight:      SpO2: 94% 97% 97% 98%    General: Pt is alert, follows commands appropriately, not in acute distress Cardiovascular: Regular rate and rhythm, S1/S2 +, no murmurs Respiratory: Clear to auscultation bilaterally, no wheezing, no crackles, no rhonchi Abdominal: Soft, non tender, non distended, bowel sounds +, no guarding Extremities: no edema, no cyanosis, pulses palpable bilaterally DP and PT Neuro: Grossly nonfocal  Discharge Instructions  Discharge Instructions    Call MD for:  difficulty breathing, headache or visual disturbances  Complete by:  As directed      Call MD for:  persistant nausea and vomiting    Complete by:  As directed      Call MD for:  severe uncontrolled pain    Complete by:  As directed      Diet - low sodium heart healthy    Complete by:  As directed      Increase activity slowly     Complete by:  As directed             Medication List    STOP taking these medications        furosemide 20 MG tablet  Commonly known as:  LASIX      TAKE these medications        aspirin 325 MG tablet  Take 1 tablet (325 mg total) by mouth daily.     digoxin 0.125 MG tablet  Commonly known as:  LANOXIN  Take 1 tablet (0.125 mg total) by mouth daily.     diltiazem 240 MG 24 hr capsule  Commonly known as:  CARDIZEM CD  Take 1 capsule (240 mg total) by mouth daily.     diphenhydrAMINE 25 mg capsule  Commonly known as:  BENADRYL  Take 50 mg by mouth every 6 (six) hours as needed for allergies.     famotidine 20 MG tablet  Commonly known as:  PEPCID  Take 1 tablet (20 mg total) by mouth daily.     metoprolol succinate 50 MG 24 hr tablet  Commonly known as:  TOPROL-XL  Take 1 tablet (50 mg total) by mouth 2 (two) times daily. Take with or immediately following a meal.          The results of significant diagnostics from this hospitalization (including imaging, microbiology, ancillary and laboratory) are listed below for reference.    Significant Diagnostic Studies: Dg Chest Port 1 View  12/01/2015  CLINICAL DATA:  Weakness EXAM: PORTABLE CHEST 1 VIEW COMPARISON:  09/16/2015 FINDINGS: Chronic cardiopericardial enlargement and vascular pedicle widening. Stable aortic tortuosity. Stable large lung volumes. There is no edema, consolidation, effusion, or pneumothorax. No acute osseous finding. IMPRESSION: Stable.  No evidence of acute disease. Electronically Signed   By: Marnee Spring M.D.   On: 12/01/2015 00:13    Microbiology: Recent Results (from the past 240 hour(s))  Urine culture     Status: Abnormal   Collection Time: 12/01/15 11:00 AM  Result Value Ref Range Status   Specimen Description URINE, RANDOM  Final   Special Requests NONE  Final   Culture MULTIPLE SPECIES PRESENT, SUGGEST RECOLLECTION (A)  Final   Report Status 12/02/2015 FINAL  Final  MRSA PCR  Screening     Status: None   Collection Time: 12/01/15 11:26 AM  Result Value Ref Range Status   MRSA by PCR NEGATIVE NEGATIVE Final    Comment:        The GeneXpert MRSA Assay (FDA approved for NASAL specimens only), is one component of a comprehensive MRSA colonization surveillance program. It is not intended to diagnose MRSA infection nor to guide or monitor treatment for MRSA infections.      Labs: Basic Metabolic Panel:  Recent Labs Lab 11/30/15 2250 11/30/15 2305 12/01/15 0408 12/02/15 0356  NA 138 141 134* 135  K 3.2* 3.7 4.4 4.2  CL 108 104 104 106  CO2 19*  --  20* 19*  GLUCOSE 368* 371* 379* 328*  BUN 14 20 17 20   CREATININE  1.12* 0.90 1.22* 1.22*  CALCIUM 8.6*  --  8.3* 9.2  MG  --   --  1.8  --    Liver Function Tests:  Recent Labs Lab 11/30/15 2250  AST 27  ALT 14  ALKPHOS 49  BILITOT 1.4*  PROT 7.1  ALBUMIN 3.6   No results for input(s): LIPASE, AMYLASE in the last 168 hours. No results for input(s): AMMONIA in the last 168 hours. CBC:  Recent Labs Lab 11/30/15 2250 11/30/15 2305 12/01/15 0408 12/02/15 0356  WBC 29.7*  --  18.4* 20.0*  HGB 15.5* 16.7* 13.6 12.3  HCT 46.9* 49.0* 41.5 38.1  MCV 102.2*  --  102.0* 102.7*  PLT 353  --  194 203   Cardiac Enzymes: No results for input(s): CKTOTAL, CKMB, CKMBINDEX, TROPONINI in the last 168 hours. BNP: BNP (last 3 results)  Recent Labs  03/24/15 1333 09/16/15 2155 11/30/15 2250  BNP 665.2* 81.8 94.4    ProBNP (last 3 results) No results for input(s): PROBNP in the last 8760 hours.  CBG:  Recent Labs Lab 12/01/15 0751 12/01/15 1138 12/01/15 1624 12/01/15 2126 12/02/15 0721  GLUCAP 423* 422* 323* 357* 318*

## 2015-12-04 ENCOUNTER — Emergency Department (HOSPITAL_COMMUNITY): Payer: Medicare Other

## 2015-12-04 ENCOUNTER — Encounter (HOSPITAL_COMMUNITY): Payer: Self-pay | Admitting: Emergency Medicine

## 2015-12-04 ENCOUNTER — Observation Stay (HOSPITAL_COMMUNITY)
Admission: EM | Admit: 2015-12-04 | Discharge: 2015-12-06 | Disposition: A | Discharge status: Home / Self Care | Payer: Medicare Other | Attending: Internal Medicine | Admitting: Internal Medicine

## 2015-12-04 DIAGNOSIS — Z79899 Other long term (current) drug therapy: Secondary | ICD-10-CM | POA: Diagnosis not present

## 2015-12-04 DIAGNOSIS — M170 Bilateral primary osteoarthritis of knee: Secondary | ICD-10-CM | POA: Diagnosis not present

## 2015-12-04 DIAGNOSIS — T782XXA Anaphylactic shock, unspecified, initial encounter: Principal | ICD-10-CM | POA: Insufficient documentation

## 2015-12-04 DIAGNOSIS — D72829 Elevated white blood cell count, unspecified: Secondary | ICD-10-CM

## 2015-12-04 DIAGNOSIS — I110 Hypertensive heart disease with heart failure: Secondary | ICD-10-CM | POA: Insufficient documentation

## 2015-12-04 DIAGNOSIS — Z8669 Personal history of other diseases of the nervous system and sense organs: Secondary | ICD-10-CM | POA: Diagnosis not present

## 2015-12-04 DIAGNOSIS — Z7982 Long term (current) use of aspirin: Secondary | ICD-10-CM | POA: Diagnosis not present

## 2015-12-04 DIAGNOSIS — I482 Chronic atrial fibrillation, unspecified: Secondary | ICD-10-CM | POA: Diagnosis present

## 2015-12-04 DIAGNOSIS — T7840XA Allergy, unspecified, initial encounter: Secondary | ICD-10-CM | POA: Diagnosis present

## 2015-12-04 DIAGNOSIS — E872 Acidosis, unspecified: Secondary | ICD-10-CM

## 2015-12-04 DIAGNOSIS — K591 Functional diarrhea: Secondary | ICD-10-CM | POA: Diagnosis not present

## 2015-12-04 DIAGNOSIS — E119 Type 2 diabetes mellitus without complications: Secondary | ICD-10-CM | POA: Diagnosis present

## 2015-12-04 DIAGNOSIS — R569 Unspecified convulsions: Secondary | ICD-10-CM

## 2015-12-04 DIAGNOSIS — R739 Hyperglycemia, unspecified: Secondary | ICD-10-CM

## 2015-12-04 DIAGNOSIS — E1165 Type 2 diabetes mellitus with hyperglycemia: Secondary | ICD-10-CM | POA: Insufficient documentation

## 2015-12-04 DIAGNOSIS — I5022 Chronic systolic (congestive) heart failure: Secondary | ICD-10-CM | POA: Insufficient documentation

## 2015-12-04 DIAGNOSIS — I10 Essential (primary) hypertension: Secondary | ICD-10-CM | POA: Diagnosis present

## 2015-12-04 DIAGNOSIS — T782XXD Anaphylactic shock, unspecified, subsequent encounter: Secondary | ICD-10-CM | POA: Diagnosis not present

## 2015-12-04 LAB — I-STAT CG4 LACTIC ACID, ED
Lactic Acid, Venous: 6.24 mmol/L (ref 0.5–1.9)
Lactic Acid, Venous: 3.09 mmol/L (ref 0.5–1.9)

## 2015-12-04 LAB — GLUCOSE, CAPILLARY
Glucose-Capillary: 343 mg/dL — ABNORMAL HIGH (ref 65–99)
Glucose-Capillary: 259 mg/dL — ABNORMAL HIGH (ref 65–99)
Glucose-Capillary: 291 mg/dL — ABNORMAL HIGH (ref 65–99)

## 2015-12-04 LAB — CBC WITH DIFFERENTIAL/PLATELET
Basophils Absolute: 0 10*3/uL (ref 0.0–0.1)
Basophils Relative: 0 %
Eosinophils Absolute: 0 10*3/uL (ref 0.0–0.7)
Eosinophils Relative: 0 %
HCT: 44.4 % (ref 36.0–46.0)
Hemoglobin: 15.1 g/dL — ABNORMAL HIGH (ref 12.0–15.0)
Lymphocytes Relative: 8 %
Lymphs Abs: 2.1 10*3/uL (ref 0.7–4.0)
MCH: 34.4 pg — ABNORMAL HIGH (ref 26.0–34.0)
MCHC: 34 g/dL (ref 30.0–36.0)
MCV: 101.1 fL — ABNORMAL HIGH (ref 78.0–100.0)
Monocytes Absolute: 1.6 10*3/uL — ABNORMAL HIGH (ref 0.1–1.0)
Monocytes Relative: 6 %
Neutro Abs: 23.1 10*3/uL — ABNORMAL HIGH (ref 1.7–7.7)
Neutrophils Relative %: 86 %
Platelets: 208 10*3/uL (ref 150–400)
RBC: 4.39 MIL/uL (ref 3.87–5.11)
RDW: 14.8 % (ref 11.5–15.5)
WBC: 26.8 10*3/uL — ABNORMAL HIGH (ref 4.0–10.5)

## 2015-12-04 LAB — C DIFFICILE QUICK SCREEN W PCR REFLEX
C Diff antigen: NEGATIVE
C Diff interpretation: NOT DETECTED
C Diff toxin: NEGATIVE

## 2015-12-04 LAB — COMPREHENSIVE METABOLIC PANEL
ALT: 17 U/L (ref 14–54)
AST: 29 U/L (ref 15–41)
Albumin: 3.7 g/dL (ref 3.5–5.0)
Alkaline Phosphatase: 47 U/L (ref 38–126)
Anion gap: 13 (ref 5–15)
BUN: 25 mg/dL — ABNORMAL HIGH (ref 6–20)
CO2: 19 mmol/L — ABNORMAL LOW (ref 22–32)
Calcium: 8.5 mg/dL — ABNORMAL LOW (ref 8.9–10.3)
Chloride: 105 mmol/L (ref 101–111)
Creatinine, Ser: 1.32 mg/dL — ABNORMAL HIGH (ref 0.44–1.00)
GFR calc Af Amer: 48 mL/min — ABNORMAL LOW (ref >60)
GFR calc non Af Amer: 41 mL/min — ABNORMAL LOW (ref >60)
Glucose, Bld: 305 mg/dL — ABNORMAL HIGH (ref 65–99)
Potassium: 3.1 mmol/L — ABNORMAL LOW (ref 3.5–5.1)
Sodium: 137 mmol/L (ref 135–145)
Total Bilirubin: 0.7 mg/dL (ref 0.3–1.2)
Total Protein: 6.8 g/dL (ref 6.5–8.1)

## 2015-12-04 LAB — GASTROINTESTINAL PANEL BY PCR, STOOL (REPLACES STOOL CULTURE)

## 2015-12-04 LAB — I-STAT TROPONIN, ED: Troponin i, poc: 0.01 ng/mL (ref 0.00–0.08)

## 2015-12-04 LAB — TSH: TSH: 1.294 u[IU]/mL (ref 0.350–4.500)

## 2015-12-04 LAB — PROTIME-INR
INR: 1.17 (ref 0.00–1.49)
Prothrombin Time: 15.1 seconds (ref 11.6–15.2)

## 2015-12-04 LAB — DIGOXIN LEVEL: Digoxin Level: <0.2 ng/mL — ABNORMAL LOW (ref 0.8–2.0)

## 2015-12-04 LAB — BRAIN NATRIURETIC PEPTIDE: B Natriuretic Peptide: 113.8 pg/mL — ABNORMAL HIGH (ref 0.0–100.0)

## 2015-12-04 MED ORDER — SODIUM CHLORIDE 0.9 % IV BOLUS (SEPSIS)
500.0000 mL | Freq: Once | INTRAVENOUS | Status: AC
  Administered 2015-12-04: 500 mL via INTRAVENOUS

## 2015-12-04 MED ORDER — SODIUM CHLORIDE 0.9 % IV BOLUS (SEPSIS): 1000.0000 mL | Freq: Once | INTRAVENOUS | Status: DC

## 2015-12-04 MED ORDER — ACETAMINOPHEN 325 MG PO TABS
650.0000 mg | ORAL_TABLET | Freq: Four times a day (QID) | ORAL | Status: DC | PRN
  Filled 2015-12-04: qty 2

## 2015-12-04 MED ORDER — MORPHINE SULFATE (PF) 2 MG/ML IV SOLN
1.0000 mg | INTRAVENOUS | Status: DC | PRN
  Filled 2015-12-04: qty 1

## 2015-12-04 MED ORDER — FAMOTIDINE 20 MG PO TABS
20.0000 mg | ORAL_TABLET | Freq: Every day | ORAL | Status: DC
  Administered 2015-12-04 – 2015-12-06 (×3): 20 mg via ORAL
  Filled 2015-12-04 (×3): qty 1

## 2015-12-04 MED ORDER — INSULIN ASPART 100 UNIT/ML ~~LOC~~ SOLN
0.0000 [IU] | Freq: Three times a day (TID) | SUBCUTANEOUS | Status: DC
  Administered 2015-12-04: 8 [IU] via SUBCUTANEOUS
  Administered 2015-12-04: 11 [IU] via SUBCUTANEOUS
  Administered 2015-12-05: 8 [IU] via SUBCUTANEOUS

## 2015-12-04 MED ORDER — METHYLPREDNISOLONE SODIUM SUCC 125 MG IJ SOLR
125.0000 mg | Freq: Once | INTRAMUSCULAR | Status: AC
  Administered 2015-12-04: 125 mg via INTRAVENOUS
  Filled 2015-12-04: qty 2

## 2015-12-04 MED ORDER — SODIUM CHLORIDE 0.9 % IV BOLUS (SEPSIS)
1000.0000 mL | Freq: Once | INTRAVENOUS | Status: AC
  Administered 2015-12-04: 1000 mL via INTRAVENOUS

## 2015-12-04 MED ORDER — ONDANSETRON HCL 4 MG PO TABS: 4.0000 mg | ORAL_TABLET | Freq: Four times a day (QID) | ORAL | Status: DC | PRN

## 2015-12-04 MED ORDER — DILTIAZEM HCL ER COATED BEADS 240 MG PO CP24
240.0000 mg | ORAL_CAPSULE | Freq: Every day | ORAL | Status: DC
  Administered 2015-12-04 – 2015-12-06 (×3): 240 mg via ORAL
  Filled 2015-12-04 (×3): qty 1

## 2015-12-04 MED ORDER — HYDROCODONE-ACETAMINOPHEN 5-325 MG PO TABS: 1.0000 | ORAL_TABLET | ORAL | Status: DC | PRN

## 2015-12-04 MED ORDER — VANCOMYCIN HCL IN DEXTROSE 1-5 GM/200ML-% IV SOLN: 1000.0000 mg | Freq: Once | INTRAVENOUS | Status: DC

## 2015-12-04 MED ORDER — METOPROLOL SUCCINATE ER 50 MG PO TB24
50.0000 mg | ORAL_TABLET | Freq: Two times a day (BID) | ORAL | Status: DC
  Administered 2015-12-04 – 2015-12-06 (×5): 50 mg via ORAL
  Filled 2015-12-04 (×5): qty 1

## 2015-12-04 MED ORDER — DEXTROSE 5 % IV SOLN
2.0000 g | Freq: Three times a day (TID) | INTRAVENOUS | Status: DC
  Filled 2015-12-04: qty 2

## 2015-12-04 MED ORDER — ACETAMINOPHEN 650 MG RE SUPP: 650.0000 mg | Freq: Four times a day (QID) | RECTAL | Status: DC | PRN

## 2015-12-04 MED ORDER — DICYCLOMINE HCL 10 MG PO CAPS
10.0000 mg | ORAL_CAPSULE | Freq: Once | ORAL | Status: AC
  Administered 2015-12-04: 10 mg via ORAL
  Filled 2015-12-04: qty 1

## 2015-12-04 MED ORDER — ONDANSETRON HCL 4 MG/2ML IJ SOLN
4.0000 mg | Freq: Four times a day (QID) | INTRAMUSCULAR | Status: DC | PRN
Start: 2015-12-04 — End: 2015-12-06
  Filled 2015-12-04: qty 2

## 2015-12-04 MED ORDER — DEXTROSE 5 % IV SOLN
2.0000 g | Freq: Once | INTRAVENOUS | Status: AC
  Administered 2015-12-04: 2 g via INTRAVENOUS
  Filled 2015-12-04: qty 2

## 2015-12-04 MED ORDER — DIPHENHYDRAMINE HCL 50 MG PO CAPS: 50.0000 mg | ORAL_CAPSULE | Freq: Four times a day (QID) | ORAL | Status: DC | PRN

## 2015-12-04 MED ORDER — SODIUM CHLORIDE 0.9% FLUSH
3.0000 mL | Freq: Two times a day (BID) | INTRAVENOUS | Status: DC
  Administered 2015-12-06: 3 mL via INTRAVENOUS

## 2015-12-04 MED ORDER — SODIUM CHLORIDE 0.9 % IV SOLN
INTRAVENOUS | Status: DC
  Administered 2015-12-04: 13:00:00 via INTRAVENOUS

## 2015-12-04 MED ORDER — DIGOXIN 125 MCG PO TABS
0.1250 mg | ORAL_TABLET | Freq: Every day | ORAL | Status: DC
  Administered 2015-12-04 – 2015-12-06 (×3): 0.125 mg via ORAL
  Filled 2015-12-04 (×3): qty 1

## 2015-12-04 MED ORDER — ONDANSETRON HCL 4 MG/2ML IJ SOLN
4.0000 mg | Freq: Once | INTRAMUSCULAR | Status: AC
  Administered 2015-12-04: 4 mg via INTRAVENOUS
  Filled 2015-12-04: qty 2

## 2015-12-04 MED ORDER — ASPIRIN 325 MG PO TABS
325.0000 mg | ORAL_TABLET | Freq: Every day | ORAL | Status: DC
  Administered 2015-12-05 – 2015-12-06 (×2): 325 mg via ORAL
  Filled 2015-12-04 (×3): qty 1

## 2015-12-04 MED ORDER — FAMOTIDINE IN NACL 20-0.9 MG/50ML-% IV SOLN
20.0000 mg | Freq: Once | INTRAVENOUS | Status: AC
  Administered 2015-12-04: 20 mg via INTRAVENOUS
  Filled 2015-12-04: qty 50

## 2015-12-04 MED ORDER — LEVOFLOXACIN IN D5W 750 MG/150ML IV SOLN
750.0000 mg | INTRAVENOUS | Status: DC
Start: 2015-12-05 — End: 2015-12-04

## 2015-12-04 MED ORDER — VANCOMYCIN HCL IN DEXTROSE 1-5 GM/200ML-% IV SOLN: 1000.0000 mg | Freq: Two times a day (BID) | INTRAVENOUS | Status: DC

## 2015-12-04 MED ORDER — HEPARIN SODIUM (PORCINE) 5000 UNIT/ML IJ SOLN
5000.0000 [IU] | Freq: Three times a day (TID) | INTRAMUSCULAR | Status: DC
  Administered 2015-12-04 – 2015-12-06 (×4): 5000 [IU] via SUBCUTANEOUS
  Filled 2015-12-04 (×5): qty 1

## 2015-12-04 MED ORDER — VANCOMYCIN HCL 10 G IV SOLR
1500.0000 mg | Freq: Once | INTRAVENOUS | Status: AC
  Administered 2015-12-04: 1500 mg via INTRAVENOUS
  Filled 2015-12-04: qty 1500

## 2015-12-04 MED ORDER — LEVOFLOXACIN IN D5W 750 MG/150ML IV SOLN
750.0000 mg | Freq: Once | INTRAVENOUS | Status: AC
  Administered 2015-12-04: 750 mg via INTRAVENOUS
  Filled 2015-12-04: qty 150

## 2015-12-04 NOTE — ED Notes (Signed)
PT unable to give urine sample at this time.   

## 2015-12-04 NOTE — Plan of Care (Signed)
Problem: Safety: Goal: Ability to remain free from injury will improve Outcome: Completed/Met Date Met:  12/04/15 Discussed safety prevention/plan with pt. She is agreeable. Pt is out of the bed 1 assist for safety, gait is steady

## 2015-12-04 NOTE — Progress Notes (Signed)
Inpatient Diabetes Program Recommendations  AACE/ADA: New Consensus Statement on Inpatient Glycemic Control (2015)  Target Ranges:  Prepandial:   less than 140 mg/dL      Peak postprandial:   less than 180 mg/dL (1-2 hours)      Critically ill patients:  140 - 180 mg/dL   Lab Results  Component Value Date   GLUCAP 343* 12/04/2015   HGBA1C 8.0* 12/01/2015    Review of Glycemic Control  Diabetes history: DM2 Outpatient Diabetes medications: None Current orders for Inpatient glycemic control: Novolog moderate tidwc  Uncontrolled blood sugars. Needs insulin adjustment.  Inpatient Diabetes Program Recommendations:    Add Lantus 15 units QHS Add CHO mod med to heart healthy diet. Add Novolog 6 units tidwc for meal coverage insulin. Add HS correction.  Will follow closely.  Thank you. Ailene Ards, RD, LDN, CDE Inpatient Diabetes Coordinator [phone]
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
## 2015-12-04 NOTE — ED Notes (Signed)
Notified EDP,Little,MD. i-stat CG4 Lactic acid 6.24.

## 2015-12-04 NOTE — ED Notes (Signed)
PA at bedside.

## 2015-12-04 NOTE — ED Notes (Signed)
Pt BIB EMS from home for c/o allergic reaction; pt ate raisins this morning and began itching and having trouble breathing; pt took 75mg  of benadryl prior to EMS arrival; 0.3 epinephrine IM given by EMS; pt had BM immediately upon arrival; pt had similar episodes 4 days ago; 2L O2 applied en route.

## 2015-12-04 NOTE — ED Notes (Signed)
Pt can go up at 9:30.

## 2015-12-04 NOTE — Progress Notes (Signed)
Pharmacy Antibiotic Note  Kaitlyn [name] is a 65 y.o. female admitted on 12/04/2015 with sepsis.  Pharmacy has been consulted for Vancomycin, levofloxacin, aztreonam dosing.  Plan: Vancomycin 1gm IV every 12 hours.  Goal trough 15-20 mcg/mL. (after 1500mg  iv x1 in ED) Aztreonam 2gm iv q8hr Levofloxacin 750mg  iv q24hr     Temp (24hrs), Avg:97.4 F (36.3 C), Min:97.4 F (36.3 C), Max:97.4 F (36.3 C)   Recent Labs Lab 11/30/15 2250 11/30/15 2305 12/01/15 0143 12/01/15 0408 12/01/15 0850 12/02/15 0356 12/04/15 0520 12/04/15 0532  WBC 29.7*  --   --  18.4*  --  20.0*  --   --   CREATININE 1.12* 0.90  --  1.22*  --  1.22* 1.32*  --   LATICACIDVEN  --  5.47* 4.71* 3.6* 4.9*  --   --  6.24*    Estimated Creatinine Clearance: 60.7 mL/min (by C-G formula based on Cr of 1.32).    Allergies  Allergen Reactions  . Lisinopril Diarrhea, Itching and Swelling  . Caffeine Other (See Comments)    Seizure  . Penicillins Other (See Comments)    Unknown childhood allergy Has patient had a PCN reaction causing immediate rash, facial/tongue/throat swelling, SOB or lightheadedness with hypotension: unknown Has patient had a PCN reaction causing severe rash involving mucus membranes or skin necrosis: unknown Has patient had a PCN reaction that required hospitalization unknown Has patient had a PCN reaction occurring within the last 10 years: unknown If all of the above answers are "NO", then may proceed with Cephalosporin use.   [name] Pineapple Itching and Swelling    Antimicrobials this admission: Vancomycin 7/20 >> Aztreonam 7/20 >> Levofloxacin 7/20  >>  Dose adjustments this admission: -  Microbiology results: pending  Thank you for allowing pharmacy to be a part of this patient's care.  Aleene Davidson Crowford 12/04/2015 6:47 AM

## 2015-12-04 NOTE — ED Notes (Signed)
Bed: XF07 Expected date:  Expected time:  Means of arrival:  Comments: EMS 65 yo female allergic reaction

## 2015-12-05 DIAGNOSIS — I10 Essential (primary) hypertension: Secondary | ICD-10-CM

## 2015-12-05 DIAGNOSIS — I5022 Chronic systolic (congestive) heart failure: Secondary | ICD-10-CM

## 2015-12-05 DIAGNOSIS — T782XXD Anaphylactic shock, unspecified, subsequent encounter: Secondary | ICD-10-CM | POA: Diagnosis not present

## 2015-12-05 DIAGNOSIS — E1165 Type 2 diabetes mellitus with hyperglycemia: Secondary | ICD-10-CM | POA: Diagnosis not present

## 2015-12-05 LAB — CBC
HCT: 38 % (ref 36.0–46.0)
Hemoglobin: 12.9 g/dL (ref 12.0–15.0)
MCH: 33.7 pg (ref 26.0–34.0)
MCHC: 33.9 g/dL (ref 30.0–36.0)
MCV: 99.2 fL (ref 78.0–100.0)
Platelets: 199 10*3/uL (ref 150–400)
RBC: 3.83 MIL/uL — ABNORMAL LOW (ref 3.87–5.11)
RDW: 14.9 % (ref 11.5–15.5)
WBC: 13.4 10*3/uL — ABNORMAL HIGH (ref 4.0–10.5)

## 2015-12-05 LAB — BASIC METABOLIC PANEL
Anion gap: 9 (ref 5–15)
BUN: 29 mg/dL — ABNORMAL HIGH (ref 6–20)
CO2: 23 mmol/L (ref 22–32)
Calcium: 8.6 mg/dL — ABNORMAL LOW (ref 8.9–10.3)
Chloride: 102 mmol/L (ref 101–111)
Creatinine, Ser: 1.12 mg/dL — ABNORMAL HIGH (ref 0.44–1.00)
GFR calc Af Amer: 58 mL/min — ABNORMAL LOW (ref >60)
GFR calc non Af Amer: 50 mL/min — ABNORMAL LOW (ref >60)
Glucose, Bld: 268 mg/dL — ABNORMAL HIGH (ref 65–99)
Potassium: 3.8 mmol/L (ref 3.5–5.1)
Sodium: 134 mmol/L — ABNORMAL LOW (ref 135–145)

## 2015-12-05 LAB — GLUCOSE, CAPILLARY
Glucose-Capillary: 294 mg/dL — ABNORMAL HIGH (ref 65–99)
Glucose-Capillary: 261 mg/dL — ABNORMAL HIGH (ref 65–99)
Glucose-Capillary: 185 mg/dL — ABNORMAL HIGH (ref 65–99)

## 2015-12-05 LAB — LACTIC ACID, PLASMA
Lactic Acid, Venous: 2.1 mmol/L (ref 0.5–1.9)
Lactic Acid, Venous: 3.2 mmol/L (ref 0.5–1.9)

## 2015-12-05 LAB — HEMOGLOBIN A1C
Hgb A1c MFr Bld: 8.2 % — ABNORMAL HIGH (ref 4.8–5.6)
Mean Plasma Glucose: 189 mg/dL

## 2015-12-05 MED ORDER — INSULIN ASPART 100 UNIT/ML ~~LOC~~ SOLN
3.0000 [IU] | Freq: Three times a day (TID) | SUBCUTANEOUS | Status: DC
  Administered 2015-12-05 – 2015-12-06 (×4): 3 [IU] via SUBCUTANEOUS

## 2015-12-05 MED ORDER — INSULIN ASPART 100 UNIT/ML ~~LOC~~ SOLN
0.0000 [IU] | Freq: Three times a day (TID) | SUBCUTANEOUS | Status: DC
  Administered 2015-12-05: 8 [IU] via SUBCUTANEOUS
  Administered 2015-12-05: 3 [IU] via SUBCUTANEOUS
  Administered 2015-12-06: 2 [IU] via SUBCUTANEOUS
  Administered 2015-12-06: 3 [IU] via SUBCUTANEOUS

## 2015-12-05 MED ORDER — INSULIN ASPART 100 UNIT/ML ~~LOC~~ SOLN: 0.0000 [IU] | Freq: Every day | SUBCUTANEOUS | Status: DC

## 2015-12-05 NOTE — Progress Notes (Signed)
[facility] PROGRESS NOTE    Progress Note  Jaymie Mckiddy  NWG:[id number] DOB: 17-Jun-1950 DOA: 12/04/2015 PCP: No PCP Per Patient     Brief Narrative:   Kaitlyn [name] is an 65 y.o. female past medical history of chronic systolic heart failure with an EF of 15% atrial fibrillation that comes into the hospital because of lip swelling, she was recently discharged from the hospital for left lactic reaction.  Assessment/Plan:   Anaphylaxis Shock: Going back to the chart she's had an allergic reaction to lisinopril significant for angioedema. She reported that these 2 episodes were followed after consumption of cranberry juice and peanut butter with crackers. She relates no insects or animal bites or new medications. He is treated aggressively with IV fluids Solu-Medrol epinephrine, Benadryl and famotidine. Lactic acidosis resolved. This morning her lip swelling and face swelling or back to baseline. 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 We'll send for C1 esterase inhibitor, C4 complements and complement components C1q, acquired C1 inhibitor deficiency is more associated with autoimmune disorder and lymphoma proliferative disease  Leukocytosis : Likely due to anaphylactic shock plus or minus use of steroids. Afebrile.  Chronic systolic heart failure: Seems to be euvolemic continue to monitor. Next  Chronic atrial fibrillation: Rate control continue metoprolol. CHA2DS2-VASc is at least 3 for female gender, age above 36 and CHF. She is only on aspirin, due to compliance.  Diabetes mellitus type 2: Uncontrolled with an A1c of 8.0. Continue sliding scale insulin.    DVT prophylaxis: lovenox Family Communication:none Disposition Plan/Barrier to D/C: home in 1-2 days Code Status:     Code Status Orders        Start     Ordered   12/04/15 1030  Full code   Continuous     12/04/15 1029    Code Status History    Date Active Date Inactive Code Status  Order ID Comments User Context   12/01/2015  1:34 AM 12/02/2015  3:55 PM Full Code [id number]  Lorretta Harp, MD ED   07/07/2015  6:07 PM 07/08/2015  4:24 PM Full Code [id number]  Jeralyn Bennett, MD ED   03/24/2015  2:17 PM 03/27/2015  9:46 PM Full Code [id number]  Dwana Melena, PA-C Inpatient   02/12/2015  7:54 PM 02/16/2015  6:06 PM Full Code [id number]  Sherald Hess, NP Inpatient   01/22/2015  7:21 AM 01/24/2015  6:09 PM Full Code [id number]  Pearson Grippe, MD Inpatient   12/22/2014 11:19 AM 12/25/2014  2:27 PM Full Code [id number]  Renae Fickle, MD Inpatient   12/14/2014  4:09 PM 12/16/2014  8:02 PM Full Code [id number]  Stephani Police, PA-C Inpatient   11/05/2014  1:22 PM 11/07/2014  2:21 PM Full Code [id number]  Zannie Cove, MD Inpatient   10/10/2014  5:30 AM 10/11/2014  7:47 PM Full Code [id number]  Eduard Clos, MD Inpatient   09/24/2014  6:41 AM 09/24/2014  3:45 PM Full Code [id number]  Rolly Salter, MD Inpatient   08/04/2014  1:24 AM 08/06/2014 11:12 PM Full Code [id number]  Ron Parker, MD Inpatient   07/29/2014  7:24 AM 08/01/2014  9:14 PM Full Code [id number]  Eduard Clos, MD ED   07/03/2014  5:11 PM 07/06/2014  5:47 PM Full Code [id number]  Abelino Derrick, PA-C ED   06/12/2014 11:21 AM 06/13/2014  6:06 PM Full Code [id number]  Laurey Morale, MD ED   05/07/2014  7:35 AM 05/08/2014  5:38 PM Full Code  [id number]  Pearson Grippe, MD Inpatient   05/07/2014  7:26 AM 05/07/2014  7:35 AM Full Code [id number]  Cathren Harsh, MD Inpatient   04/27/2014  3:16 AM 04/28/2014  5:14 PM Full Code [id number]  Hillary Bow, DO ED   03/22/2014  1:06 AM 03/25/2014  6:36 PM Full Code [id number]  Eduard Clos, MD ED        IV Access:    Peripheral IV   Procedures and diagnostic studies:   Dg Chest Port 1 View  12-14-2015  CLINICAL DATA:  Shortness of breath.  Possible allergic reaction EXAM: PORTABLE CHEST 1 VIEW COMPARISON:  11/30/2015 FINDINGS: Chronic mild cardiomegaly and aortic tortuosity. Chronic right  peritracheal opacity; no mass on 09/24/2014 chest CT. There is no edema, consolidation, effusion, or pneumothorax. IMPRESSION: Stable.  No acute finding. Electronically Signed   By: Marnee Spring M.D.   On: 12-14-2015 06:11     Medical Consultants:    None.  Anti-Infectives:   None  Subjective:    Kaitlyn [name] she relates that her swelling is completely resolved she feels much better than yesterday.  Objective:    Filed Vitals:   12/14/2015 1500 2015/12/14 2233 12/05/15 0500 12/05/15 0816  BP: 153/98 165/91 161/91 165/88  Pulse: 81 83 86 84  Temp: 97.8 F (36.6 C) 98 F (36.7 C) 98 F (36.7 C)   TempSrc: Oral Oral Oral   Resp: 18 20 18    Height:      Weight:      SpO2: 96% 97% 98%     Intake/Output Summary (Last 24 hours) at 12/05/15 1129 Last data filed at 12/05/15 0745  Gross per 24 hour  Intake    890 ml  Output   1000 ml  Net   -110 ml   Filed Weights   12/14/2015 0720 Dec 14, 2015 0949  Weight: 118.842 kg (262 lb) 119.75 kg (264 lb)    Exam: General exam: In no acute distress. Respiratory system: Good air movement and clear to auscultation. Cardiovascular system: S1 & S2 heard, RRR. Gastrointestinal system: Abdomen is nondistended, soft and nontender.  Central nervous system: Alert and oriented. No focal neurological deficits. Extremities: No pedal edema. Skin: No rashes, lesions or ulcers Psychiatry: Judgement and insight appear normal. Mood & affect appropriate.    Data Reviewed:    Labs: Basic Metabolic Panel:  Recent Labs Lab 11/30/15 2250 11/30/15 2305 12/01/15 0408 12/02/15 0356 2015/12/14 0520 12/05/15 0516  NA 138 141 134* 135 137 134*  K 3.2* 3.7 4.4 4.2 3.1* 3.8  CL 108 104 104 106 105 102  CO2 19*  --  20* 19* 19* 23  GLUCOSE 368* 371* 379* 328* 305* 268*  BUN 14 20 17 20  25* 29*  CREATININE 1.12* 0.90 1.22* 1.22* 1.32* 1.12*  CALCIUM 8.6*  --  8.3* 9.2 8.5* 8.6*  MG  --   --  1.8  --   --   --    GFR Estimated Creatinine  Clearance: 71.5 mL/min (by C-G formula based on Cr of 1.12). Liver Function Tests:  Recent Labs Lab 11/30/15 2250 Dec 14, 2015 0520  AST 27 29  ALT 14 17  ALKPHOS 49 47  BILITOT 1.4* 0.7  PROT 7.1 6.8  ALBUMIN 3.6 3.7   No results for input(s): LIPASE, AMYLASE in the last 168 hours. No results for input(s): AMMONIA in the last 168 hours. Coagulation profile  Recent Labs Lab 11/30/15 2250 12-14-15 1052  INR 1.09 1.17    CBC:  Recent Labs Lab 11/30/15 2250 11/30/15 2305 12/01/15 0408 12/02/15 0356 12/04/15 0643 12/05/15 0516  WBC 29.7*  --  18.4* 20.0* 26.8* 13.4*  NEUTROABS  --   --   --   --  23.1*  --   HGB 15.5* 16.7* 13.6 12.3 15.1* 12.9  HCT 46.9* 49.0* 41.5 38.1 44.4 38.0  MCV 102.2*  --  102.0* 102.7* 101.1* 99.2  PLT 353  --  194 203 208 199   Cardiac Enzymes: No results for input(s): CKTOTAL, CKMB, CKMBINDEX, TROPONINI in the last 168 hours. BNP (last 3 results) No results for input(s): PROBNP in the last 8760 hours. CBG:  Recent Labs Lab 12/02/15 0721 12/04/15 1126 12/04/15 1731 12/04/15 2109 12/05/15 0742  GLUCAP 318* 343* 259* 291* 294*   D-Dimer: No results for input(s): DDIMER in the last 72 hours. Hgb A1c:  Recent Labs  12/04/15 1052  HGBA1C 8.2*   Lipid Profile: No results for input(s): CHOL, HDL, LDLCALC, TRIG, CHOLHDL, LDLDIRECT in the last 72 hours. Thyroid function studies:  Recent Labs  12/04/15 1052  TSH 1.294   Anemia work up: No results for input(s): VITAMINB12, FOLATE, FERRITIN, TIBC, IRON, RETICCTPCT in the last 72 hours. Sepsis Labs:  Recent Labs Lab 12/01/15 0408  12/02/15 0356 12/04/15 0532 12/04/15 5621 12/04/15 0935 12/05/15 0516 12/05/15 0949  WBC 18.4*  --  20.0*  --  26.8*  --  13.4*  --   LATICACIDVEN 3.6*  < >  --  6.24*  --  3.09* 2.1* 3.2*  < > = values in this interval not displayed. Microbiology Recent Results (from the past 240 hour(s))  Blood culture (routine x 2)     Status: None  (Preliminary result)   Collection Time: 11/30/15 11:42 PM  Result Value Ref Range Status   Specimen Description BLOOD LEFT FOREARM  Final   Special Requests BOTTLES DRAWN AEROBIC AND ANAEROBIC 5CC  Final   Culture NO GROWTH 3 DAYS  Final   Report Status PENDING  Incomplete  Blood culture (routine x 2)     Status: None (Preliminary result)   Collection Time: 11/30/15 11:55 PM  Result Value Ref Range Status   Specimen Description BLOOD LEFT HAND  Final   Special Requests IN PEDIATRIC BOTTLE 4CC  Final   Culture NO GROWTH 3 DAYS  Final   Report Status PENDING  Incomplete  Urine culture     Status: Abnormal   Collection Time: 12/01/15 11:00 AM  Result Value Ref Range Status   Specimen Description URINE, RANDOM  Final   Special Requests NONE  Final   Culture MULTIPLE SPECIES PRESENT, SUGGEST RECOLLECTION (A)  Final   Report Status 12/02/2015 FINAL  Final  MRSA PCR Screening     Status: None   Collection Time: 12/01/15 11:26 AM  Result Value Ref Range Status   MRSA by PCR NEGATIVE NEGATIVE Final    Comment:        The GeneXpert MRSA Assay (FDA approved for NASAL specimens only), is one component of a comprehensive MRSA colonization surveillance program. It is not intended to diagnose MRSA infection nor to guide or monitor treatment for MRSA infections.   Gastrointestinal Panel by PCR , Stool     Status: None   Collection Time: 12/04/15  7:43 AM  Result Value Ref Range Status   Campylobacter species NOT DETECTED NOT DETECTED Final   Plesimonas shigelloides NOT DETECTED NOT DETECTED Final   Salmonella species NOT DETECTED NOT DETECTED Final   Yersinia  enterocolitica NOT DETECTED NOT DETECTED Final   Vibrio species NOT DETECTED NOT DETECTED Final   Vibrio cholerae NOT DETECTED NOT DETECTED Final   Enteroaggregative E coli (EAEC) NOT DETECTED NOT DETECTED Final   Enteropathogenic E coli (EPEC) NOT DETECTED NOT DETECTED Final   Enterotoxigenic E coli (ETEC) NOT DETECTED NOT  DETECTED Final   Shiga like toxin producing E coli (STEC) NOT DETECTED NOT DETECTED Final   E. coli O157 NOT DETECTED NOT DETECTED Final   Shigella/Enteroinvasive E coli (EIEC) NOT DETECTED NOT DETECTED Final   Cryptosporidium NOT DETECTED NOT DETECTED Final   Cyclospora cayetanensis NOT DETECTED NOT DETECTED Final   Entamoeba histolytica NOT DETECTED NOT DETECTED Final   Giardia lamblia NOT DETECTED NOT DETECTED Final   Adenovirus F40/41 NOT DETECTED NOT DETECTED Final   Astrovirus NOT DETECTED NOT DETECTED Final   Norovirus GI/GII NOT DETECTED NOT DETECTED Final   Rotavirus A NOT DETECTED NOT DETECTED Final   Sapovirus (I, II, IV, and V) NOT DETECTED NOT DETECTED Final  C difficile quick scan w PCR reflex     Status: None   Collection Time: 12/04/15  7:43 AM  Result Value Ref Range Status   C Diff antigen NEGATIVE NEGATIVE Final   C Diff toxin NEGATIVE NEGATIVE Final   C Diff interpretation No C. difficile detected.  Final     Medications:   . aspirin  325 mg Oral Daily  . digoxin  0.125 mg Oral Daily  . diltiazem  240 mg Oral Daily  . famotidine  20 mg Oral Daily  . heparin  5,000 Units Subcutaneous Q8H  . insulin aspart  0-15 Units Subcutaneous TID WC  . insulin aspart  0-5 Units Subcutaneous QHS  . insulin aspart  3 Units Subcutaneous TID WC  . metoprolol succinate  50 mg Oral BID  . sodium chloride flush  3 mL Intravenous Q12H   Continuous Infusions: . sodium chloride 10 mL/hr at 12/04/15 1323    Time spent: 25 min     FELIZ Rosine Beat  [facility] Pager [phone]  *Please refer to [email or web address], password [email or web address] to get updated schedule on who will round on this patient, as hospitalists switch teams weekly. If 7PM-7AM, please contact night-coverage at [email or web address], password [email or web address] for any overnight needs.  12/05/2015, 11:29 AM

## 2015-12-05 NOTE — Progress Notes (Signed)
CRITICAL VALUE ALERT  Critical value received:  Lactic Acid 2.1  Date of notification:  12/05/15  Time of notification:  0608  Critical value read back:yes  Nurse who received alert:  Leward Quan  MD notified (1st page): yes  Time of first page: [phone]

## 2015-12-05 NOTE — Care Management Note (Signed)
Case Management Note  Patient Details  Name: Kaitlyn [name] MRN: [id number] Date of Birth: July 07, 1950  Subjective/Objective:64 y/o f admitted w/Anaphylaxis. From home.                    Action/Plan:d/c plan home.   Expected Discharge Date:   (unknown)               Expected Discharge Plan:  Home/Self Care  In-House Referral:     Discharge planning Services  CM Consult  Post Acute Care Choice:    Choice offered to:     DME Arranged:    DME Agency:     HH Arranged:    HH Agency:     Status of Service:  In process, will continue to follow  If discussed at Long Length of Stay Meetings, dates discussed:    Additional Comments:  Lanier Clam, RN 12/05/2015, 9:32 AM

## 2015-12-06 DIAGNOSIS — T782XXA Anaphylactic shock, unspecified, initial encounter: Secondary | ICD-10-CM

## 2015-12-06 DIAGNOSIS — I482 Chronic atrial fibrillation: Secondary | ICD-10-CM | POA: Diagnosis not present

## 2015-12-06 DIAGNOSIS — I5022 Chronic systolic (congestive) heart failure: Secondary | ICD-10-CM | POA: Diagnosis not present

## 2015-12-06 DIAGNOSIS — T782XXD Anaphylactic shock, unspecified, subsequent encounter: Secondary | ICD-10-CM | POA: Diagnosis not present

## 2015-12-06 LAB — CBC
HCT: 39.1 % (ref 36.0–46.0)
Hemoglobin: 13.2 g/dL (ref 12.0–15.0)
MCH: 34 pg (ref 26.0–34.0)
MCHC: 33.8 g/dL (ref 30.0–36.0)
MCV: 100.8 fL — ABNORMAL HIGH (ref 78.0–100.0)
Platelets: 175 10*3/uL (ref 150–400)
RBC: 3.88 MIL/uL (ref 3.87–5.11)
RDW: 15.1 % (ref 11.5–15.5)
WBC: 14.2 10*3/uL — ABNORMAL HIGH (ref 4.0–10.5)

## 2015-12-06 LAB — BASIC METABOLIC PANEL
Anion gap: 6 (ref 5–15)
BUN: 23 mg/dL — ABNORMAL HIGH (ref 6–20)
CO2: 28 mmol/L (ref 22–32)
Calcium: 8.5 mg/dL — ABNORMAL LOW (ref 8.9–10.3)
Chloride: 103 mmol/L (ref 101–111)
Creatinine, Ser: 0.94 mg/dL (ref 0.44–1.00)
GFR calc Af Amer: >60 mL/min (ref >60)
GFR calc non Af Amer: >60 mL/min (ref >60)
Glucose, Bld: 138 mg/dL — ABNORMAL HIGH (ref 65–99)
Potassium: 3.7 mmol/L (ref 3.5–5.1)
Sodium: 137 mmol/L (ref 135–145)

## 2015-12-06 LAB — GLUCOSE, CAPILLARY
Glucose-Capillary: 184 mg/dL — ABNORMAL HIGH (ref 65–99)
Glucose-Capillary: 138 mg/dL — ABNORMAL HIGH (ref 65–99)
Glucose-Capillary: 164 mg/dL — ABNORMAL HIGH (ref 65–99)

## 2015-12-06 LAB — C4 COMPLEMENT: Complement C4, Body Fluid: 13 mg/dL — ABNORMAL LOW (ref 14–44)

## 2015-12-06 LAB — CULTURE, BLOOD (ROUTINE X 2)
Culture: NO GROWTH
Culture: NO GROWTH

## 2015-12-06 MED ORDER — EPINEPHRINE 0.3 MG/0.3ML IJ SOAJ: 0.3000 mg | Freq: Once | INTRAMUSCULAR | Status: DC

## 2015-12-06 NOTE — Care Management Note (Signed)
Case Management Note  Patient Details  Name: Kaitlyn [name] MRN: [id number] Date of Birth: Feb 15, 1951  Subjective/Objective:                  Anaphylaxsis Action/Plan: Discharge planning Expected Discharge Date:   (unknown)               Expected Discharge Plan:  Home/Self Care  In-House Referral:     Discharge planning Services  CM Consult, Medication Assistance  Post Acute Care Choice:    Choice offered to:  Patient  DME Arranged:    DME Agency:     HH Arranged:    HH Agency:     Status of Service:  Completed, signed off  If discussed at [employer] of Stay Meetings, dates discussed:    Additional Comments: CM received call from RN requesting taxi voucher for pt to get home and med asst for epi-pen.  Cm called CSW who states she will arrange for taxi vouchers and unfortunately, pt has Medicare and is therefore not able to use [facility] for med asst.  No other Cm needs were communicated. Yves Dill, RN 12/06/2015, 12:43 PM

## 2015-12-06 NOTE — Progress Notes (Signed)
Patient discharged home via taxi. Discharge instructions reviewed and pt verbalized understanding.

## 2015-12-06 NOTE — Discharge Summary (Signed)
Physician Discharge Summary  Kaitlyn [name] UJW:[id number] DOB: August 05, 1950 DOA: 12/04/2015  PCP: No PCP Per Patient  Admit date: 12/04/2015 Discharge date: 12/06/2015  Time spent: 35 minutes  Recommendations for Outpatient Follow-up:  1. Follow-up with primary care doctor as an outpatient 2-4 weeks, lab results of C1q inhibitor 2. Follow-up with allergy M.D. within 2 weeks    Discharge Diagnoses:  Principal Problem:   Anaphylaxis Active Problems:   Chronic atrial fibrillation [facility])   Essential hypertension   Chronic systolic CHF (congestive heart failure) (HCC)   Diabetes type 2, uncontrolled (HCC)   Seizures (HCC)   Discharge Condition: stable  Diet recommendation: carb modified  Filed Weights   12/04/15 0949 12/05/15 1255 12/06/15 0518  Weight: 119.75 kg (264 lb) 120.566 kg (265 lb 12.8 oz) 120.3 kg (265 lb 3.4 oz)    History of present illness:  65 y.o. female with medical history significant of chronic systolic CHF with LVEF of 15-20%, atrial fibrillation and seizures patient came to the hospital because of lip swelling. Patient was recently admitted to the hospital from 7/16-18th for almost the same symptoms  Hospital Course:  Anaphylactic shock:  She reported having before her 2 episodes. Her brother with crackers, she denies any new medication animal bites or insect bites. She was treated aggressively with IV fluids on a measure epinephrine Benadryl and famotidine. By the next day she was improved, CQ 1 inhibitor labs ( C1 esterase inhibitor, C4 complements and complement components C1q, acquired C1 inhibitor deficiency )were sent which will be follow-up by her primary care doctor. She was given a prescription for EpiPen and will follow-up with allergy physician is a 9 patient  Diagnosis: She remained afebrile this resolve likely due to anaphylaxis plus or minus steroids.  Chronic heart failure: No changes were made to her medication.  Diabetes mellitus type  2: Uncontrolled A1c was 8.0 no changes were made to her medication.  Procedures:  CXR  Consultations:  none  Discharge Exam: Filed Vitals:   12/06/15 0518 12/06/15 0541  BP: 162/103 156/90  Pulse: 71   Temp: 98 F (36.7 C)   Resp: 18     General: A&O x3 Cardiovascular: RRR Respiratory: good air movement CTA B/L  Discharge Instructions   Discharge Instructions    Diet - low sodium heart healthy    Complete by:  As directed      Increase activity slowly    Complete by:  As directed           Current Discharge Medication List    START taking these medications   Details  EPINEPHrine (EPIPEN 2-PAK) 0.3 mg/0.3 mL IJ SOAJ injection Inject 0.3 mLs (0.3 mg total) into the muscle once. Qty: 1 Device, Refills: 2      CONTINUE these medications which have NOT CHANGED   Details  aspirin 325 MG tablet Take 1 tablet (325 mg total) by mouth daily. Qty: 30 tablet, Refills: 3    digoxin (LANOXIN) 0.125 MG tablet Take 1 tablet (0.125 mg total) by mouth daily. Qty: 30 tablet, Refills: 0    diltiazem (CARDIZEM CD) 240 MG 24 hr capsule Take 1 capsule (240 mg total) by mouth daily. Qty: 30 capsule, Refills: 0    diphenhydrAMINE (BENADRYL) 25 mg capsule Take 50 mg by mouth every 6 (six) hours as needed for allergies.    famotidine (PEPCID) 20 MG tablet Take 1 tablet (20 mg total) by mouth daily. Qty: 20 tablet, Refills: 0    metoprolol succinate (TOPROL-XL)  50 MG 24 hr tablet Take 1 tablet (50 mg total) by mouth 2 (two) times daily. Take with or immediately following a meal. Qty: 60 tablet, Refills: 6       Allergies  Allergen Reactions  . Lisinopril Diarrhea, Itching and Swelling  . Caffeine Other (See Comments)    Seizure  . Penicillins Other (See Comments)    Unknown childhood allergy Has patient had a PCN reaction causing immediate rash, facial/tongue/throat swelling, SOB or lightheadedness with hypotension: unknown Has patient had a PCN reaction causing severe  rash involving mucus membranes or skin necrosis: unknown Has patient had a PCN reaction that required hospitalization unknown Has patient had a PCN reaction occurring within the last 10 years: unknown If all of the above answers are "NO", then may proceed with Cephalosporin use.   [name] Pineapple Itching and Swelling      The results of significant diagnostics from this hospitalization (including imaging, microbiology, ancillary and laboratory) are listed below for reference.    Significant Diagnostic Studies: Dg Chest Port 1 View  12/04/2015  CLINICAL DATA:  Shortness of breath.  Possible allergic reaction EXAM: PORTABLE CHEST 1 VIEW COMPARISON:  11/30/2015 FINDINGS: Chronic mild cardiomegaly and aortic tortuosity. Chronic right peritracheal opacity; no mass on 09/24/2014 chest CT. There is no edema, consolidation, effusion, or pneumothorax. IMPRESSION: Stable.  No acute finding. Electronically Signed   By: Marnee Spring M.D.   On: 12/04/2015 06:11   Dg Chest Port 1 View  12/01/2015  CLINICAL DATA:  Weakness EXAM: PORTABLE CHEST 1 VIEW COMPARISON:  09/16/2015 FINDINGS: Chronic cardiopericardial enlargement and vascular pedicle widening. Stable aortic tortuosity. Stable large lung volumes. There is no edema, consolidation, effusion, or pneumothorax. No acute osseous finding. IMPRESSION: Stable.  No evidence of acute disease. Electronically Signed   By: Marnee Spring M.D.   On: 12/01/2015 00:13    Microbiology: Recent Results (from the past 240 hour(s))  Blood culture (routine x 2)     Status: None (Preliminary result)   Collection Time: 11/30/15 11:42 PM  Result Value Ref Range Status   Specimen Description BLOOD LEFT FOREARM  Final   Special Requests BOTTLES DRAWN AEROBIC AND ANAEROBIC 5CC  Final   Culture NO GROWTH 4 DAYS  Final   Report Status PENDING  Incomplete  Blood culture (routine x 2)     Status: None (Preliminary result)   Collection Time: 11/30/15 11:55 PM  Result Value Ref  Range Status   Specimen Description BLOOD LEFT HAND  Final   Special Requests IN PEDIATRIC BOTTLE 4CC  Final   Culture NO GROWTH 4 DAYS  Final   Report Status PENDING  Incomplete  Urine culture     Status: Abnormal   Collection Time: 12/01/15 11:00 AM  Result Value Ref Range Status   Specimen Description URINE, RANDOM  Final   Special Requests NONE  Final   Culture MULTIPLE SPECIES PRESENT, SUGGEST RECOLLECTION (A)  Final   Report Status 12/02/2015 FINAL  Final  MRSA PCR Screening     Status: None   Collection Time: 12/01/15 11:26 AM  Result Value Ref Range Status   MRSA by PCR NEGATIVE NEGATIVE Final    Comment:        The GeneXpert MRSA Assay (FDA approved for NASAL specimens only), is one component of a comprehensive MRSA colonization surveillance program. It is not intended to diagnose MRSA infection nor to guide or monitor treatment for MRSA infections.   Culture, blood (Routine x 2)  Status: None (Preliminary result)   Collection Time: 12/04/15  5:46 AM  Result Value Ref Range Status   Specimen Description BLOOD RIGHT FOREARM  Final   Special Requests BOTTLES DRAWN AEROBIC AND ANAEROBIC 5CC  Final   Culture   Final    NO GROWTH 1 DAY Performed at [facility]    Report Status PENDING  Incomplete  Gastrointestinal Panel by PCR , Stool     Status: None   Collection Time: 12/04/15  7:43 AM  Result Value Ref Range Status   Campylobacter species NOT DETECTED NOT DETECTED Final   Plesimonas shigelloides NOT DETECTED NOT DETECTED Final   Salmonella species NOT DETECTED NOT DETECTED Final   Yersinia enterocolitica NOT DETECTED NOT DETECTED Final   Vibrio species NOT DETECTED NOT DETECTED Final   Vibrio cholerae NOT DETECTED NOT DETECTED Final   Enteroaggregative E coli (EAEC) NOT DETECTED NOT DETECTED Final   Enteropathogenic E coli (EPEC) NOT DETECTED NOT DETECTED Final   Enterotoxigenic E coli (ETEC) NOT DETECTED NOT DETECTED Final   Shiga like toxin  producing E coli (STEC) NOT DETECTED NOT DETECTED Final   E. coli O157 NOT DETECTED NOT DETECTED Final   Shigella/Enteroinvasive E coli (EIEC) NOT DETECTED NOT DETECTED Final   Cryptosporidium NOT DETECTED NOT DETECTED Final   Cyclospora cayetanensis NOT DETECTED NOT DETECTED Final   Entamoeba histolytica NOT DETECTED NOT DETECTED Final   Giardia lamblia NOT DETECTED NOT DETECTED Final   Adenovirus F40/41 NOT DETECTED NOT DETECTED Final   Astrovirus NOT DETECTED NOT DETECTED Final   Norovirus GI/GII NOT DETECTED NOT DETECTED Final   Rotavirus A NOT DETECTED NOT DETECTED Final   Sapovirus (I, II, IV, and V) NOT DETECTED NOT DETECTED Final  C difficile quick scan w PCR reflex     Status: None   Collection Time: 12/04/15  7:43 AM  Result Value Ref Range Status   C Diff antigen NEGATIVE NEGATIVE Final   C Diff toxin NEGATIVE NEGATIVE Final   C Diff interpretation No C. difficile detected.  Final     Labs: Basic Metabolic Panel:  Recent Labs Lab 12/01/15 0408 12/02/15 0356 12/04/15 0520 12/05/15 0516 12/06/15 0556  NA 134* 135 137 134* 137  K 4.4 4.2 3.1* 3.8 3.7  CL 104 106 105 102 103  CO2 20* 19* 19* 23 28  GLUCOSE 379* 328* 305* 268* 138*  BUN 17 20 25* 29* 23*  CREATININE 1.22* 1.22* 1.32* 1.12* 0.94  CALCIUM 8.3* 9.2 8.5* 8.6* 8.5*  MG 1.8  --   --   --   --    Liver Function Tests:  Recent Labs Lab 11/30/15 2250 12/04/15 0520  AST 27 29  ALT 14 17  ALKPHOS 49 47  BILITOT 1.4* 0.7  PROT 7.1 6.8  ALBUMIN 3.6 3.7   No results for input(s): LIPASE, AMYLASE in the last 168 hours. No results for input(s): AMMONIA in the last 168 hours. CBC:  Recent Labs Lab 12/01/15 0408 12/02/15 0356 12/04/15 0643 12/05/15 0516 12/06/15 0556  WBC 18.4* 20.0* 26.8* 13.4* 14.2*  NEUTROABS  --   --  23.1*  --   --   HGB 13.6 12.3 15.1* 12.9 13.2  HCT 41.5 38.1 44.4 38.0 39.1  MCV 102.0* 102.7* 101.1* 99.2 100.8*  PLT 194 203 208 199 175   Cardiac Enzymes: No results  for input(s): CKTOTAL, CKMB, CKMBINDEX, TROPONINI in the last 168 hours. BNP: BNP (last 3 results)  Recent Labs  09/16/15 2155  11/30/15 2250 12/04/15 0520  BNP 81.8 94.4 113.8*    ProBNP (last 3 results) No results for input(s): PROBNP in the last 8760 hours.  CBG:  Recent Labs Lab 12/05/15 1146 12/05/15 1651 12/05/15 2122 12/06/15 0726 12/06/15 1138  GLUCAP 261* 185* 184* 138* 164*       Signed:  Marinda Elk MD.  [facility] 12/06/2015, 12:13 PM

## 2015-12-06 NOTE — Clinical Social Work Note (Signed)
CSW was contacted by RN case manager requesting taxi voucher for discharge home.  Per RN patient is unable to walk from bus stop and requires a taxi.  CSW met with patient to confirm address and provided RN with voucher.  Dede Query, LCSW [place] Worker - Weekend Coverage cell #: [phone]

## 2015-12-08 LAB — C1 ESTERASE INHIBITOR: C1INH SerPl-mCnc: 26 mg/dL (ref 21–39)

## 2015-12-09 LAB — CULTURE, BLOOD (ROUTINE X 2): Culture: NO GROWTH

## 2015-12-12 LAB — COMPLEMENT COMPONENT C1Q: C1q Complement Protein CC1Q: 14 mg/dL (ref 11.8–24.4)

## 2015-12-16 IMAGING — DX DG CHEST 2V
2 series · 2 of 2 positions shown · non-contrast
Comparison: 12/22/2014

CLINICAL DATA: Dyspnea for 2 days.

EXAM:
CHEST  2 VIEW

[chest lat]
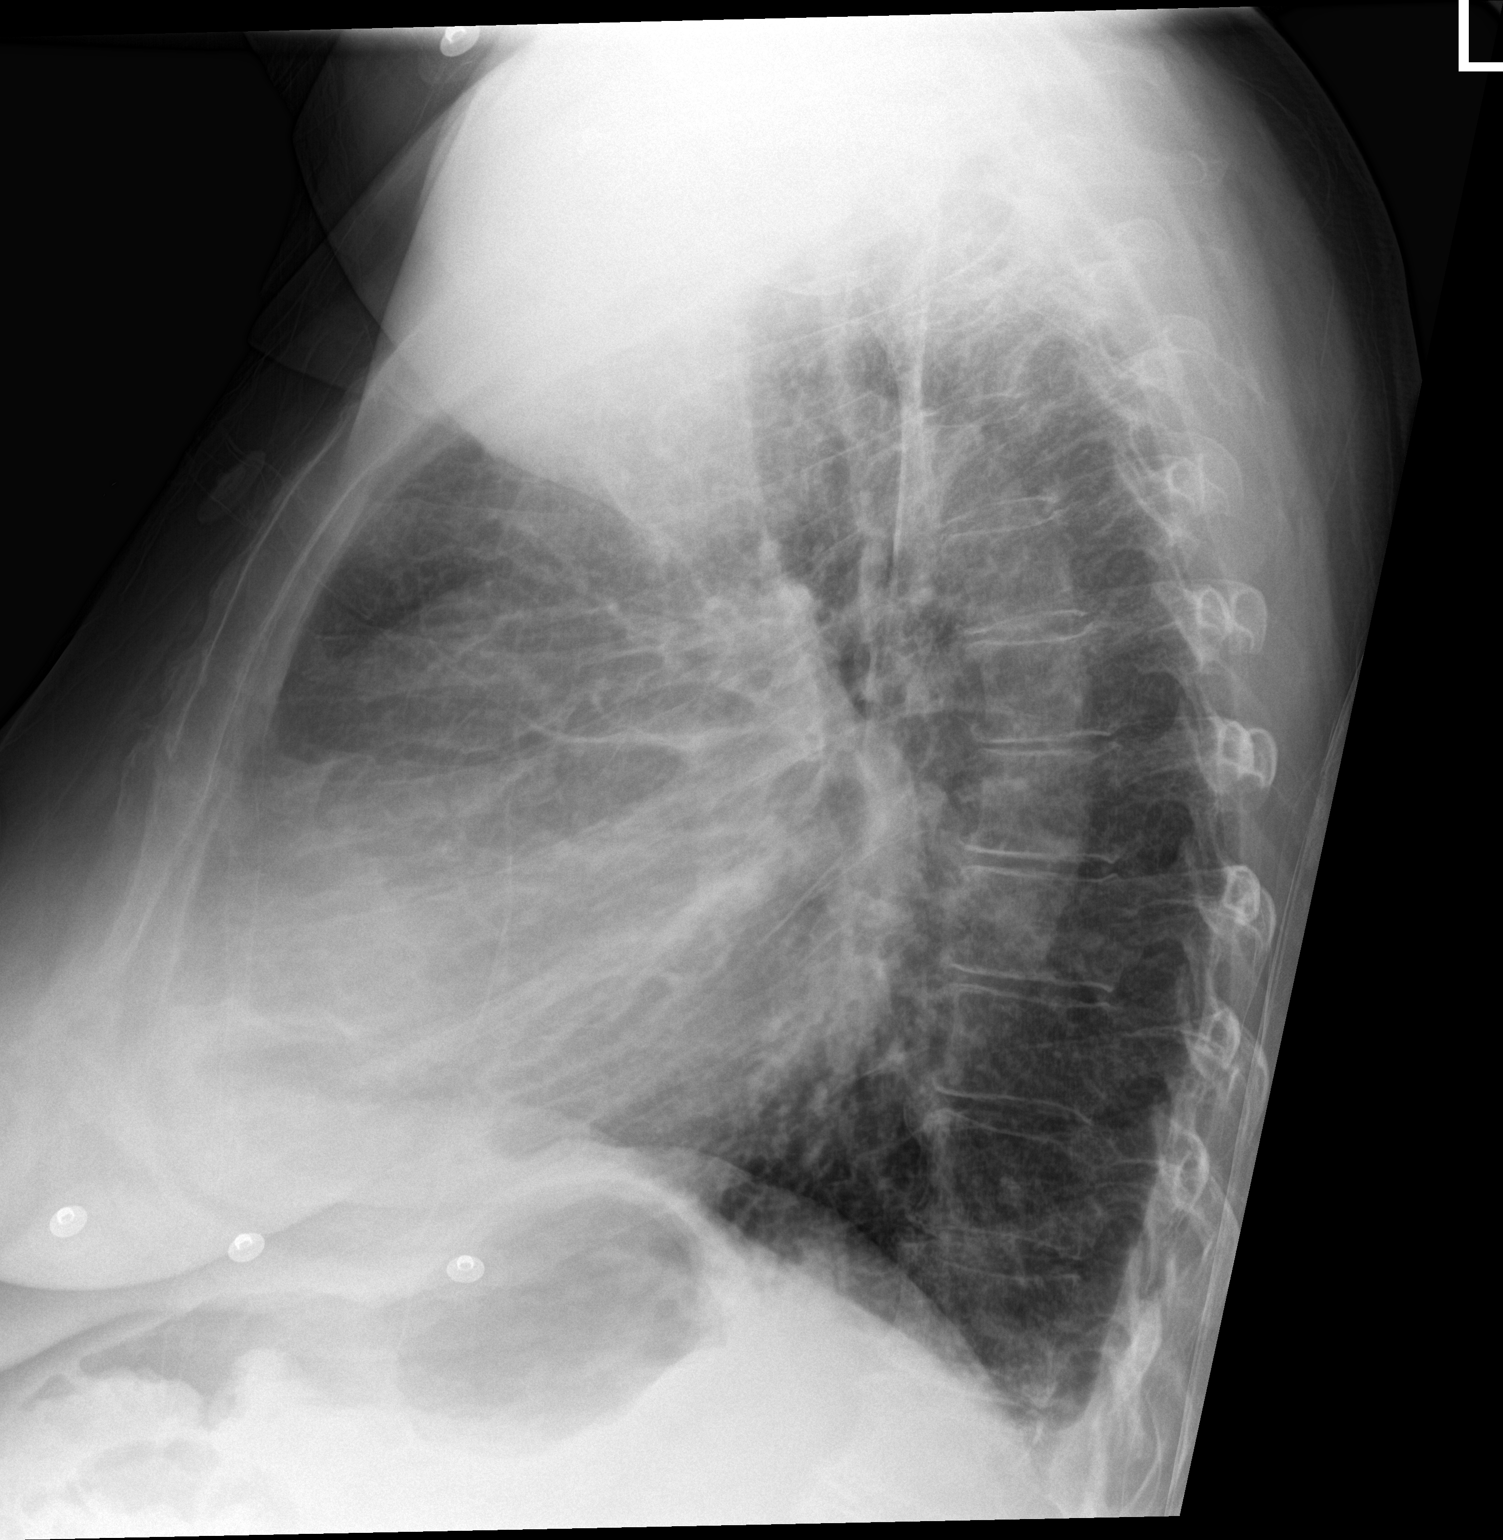

[chest ap]
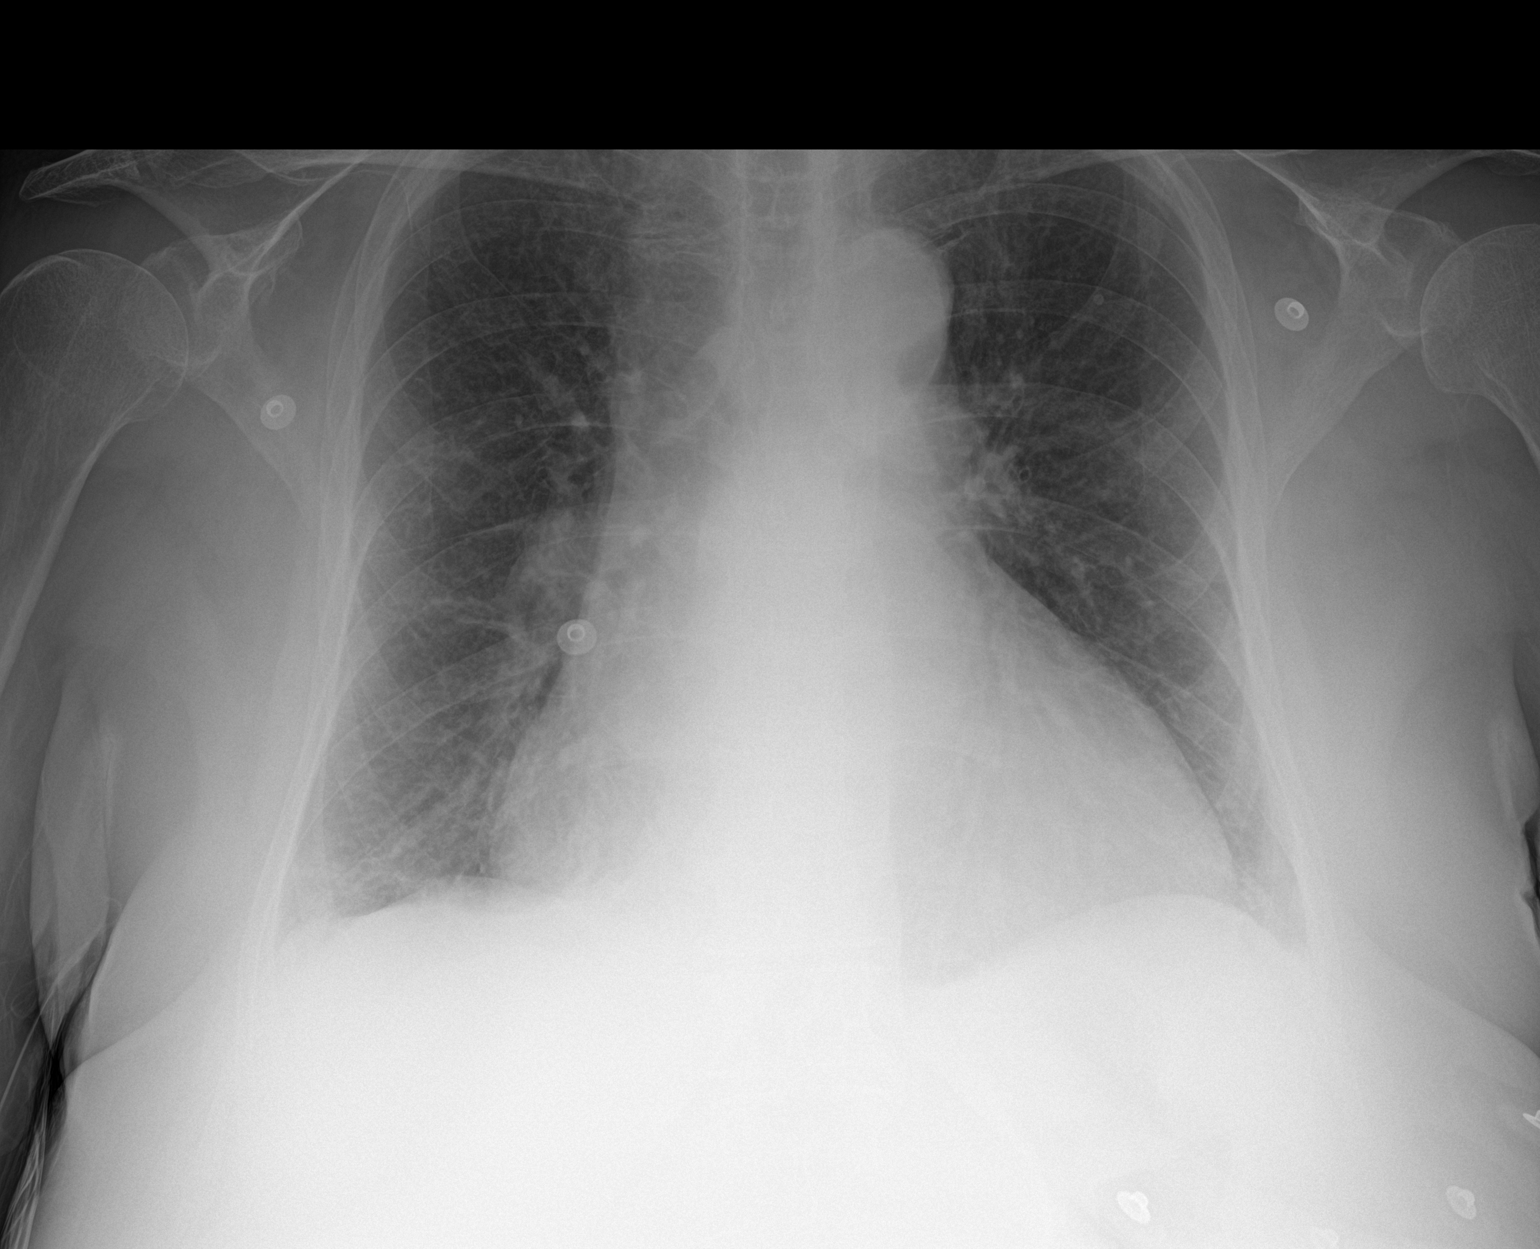

[2 of 2 positions shown; findings below may reference images not displayed]

FINDINGS: There is unchanged cardiomegaly. There is mild hyperinflation.
Minimal linear basilar opacities are present on the right, scarring
versus atelectasis. There is no confluent alveolar opacity. There is
no effusion. Pulmonary vasculature is normal.
IMPRESSION: Unchanged cardiomegaly. Minimal linear scarring or atelectasis in
the right base.

## 2015-12-19 IMAGING — DX DG CHEST 2V
2 series · 2 of 2 positions shown · non-contrast
Comparison: 01/18/15

CLINICAL DATA: Chest pain and shortness of breath for 3 days

EXAM:
CHEST  2 VIEW

[w chest lat]
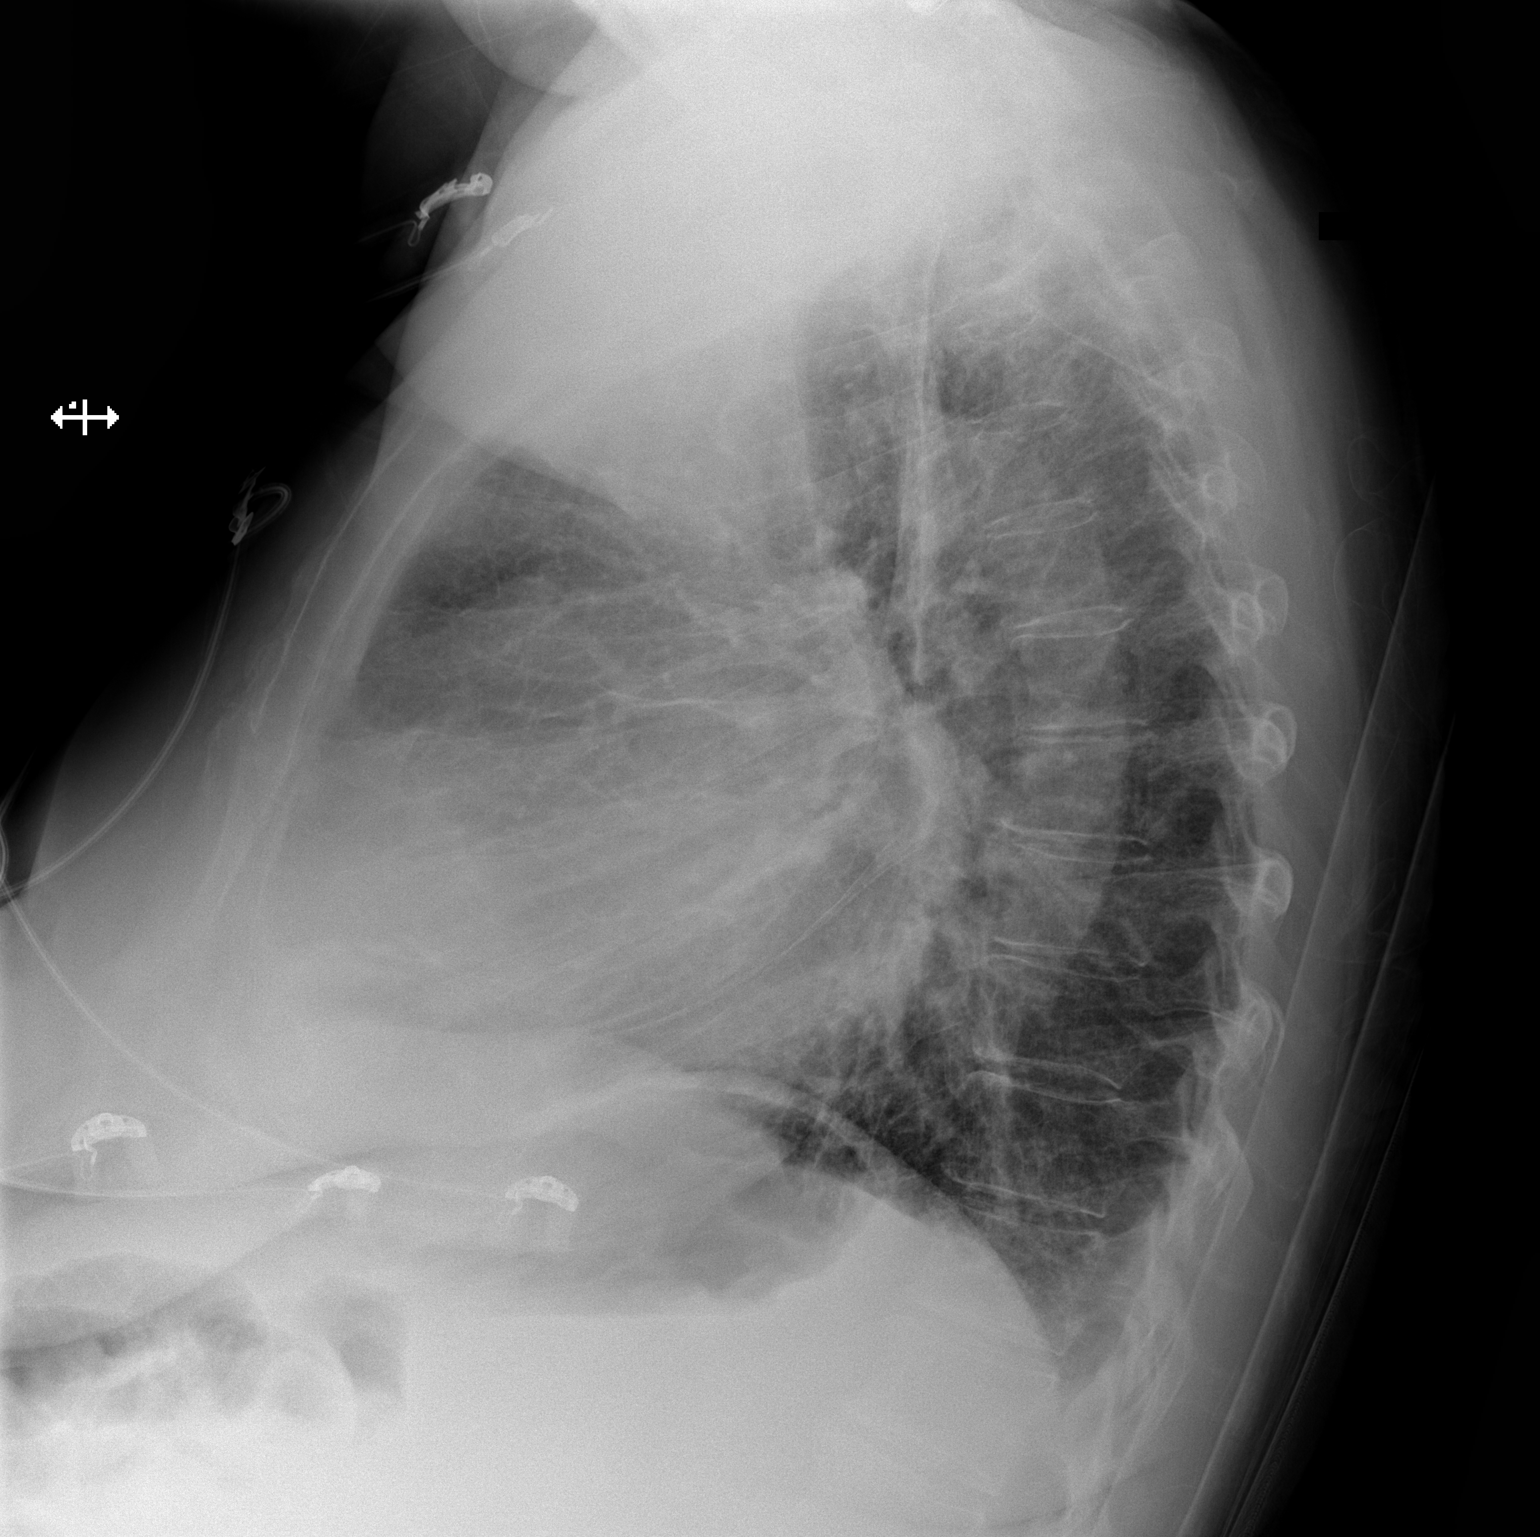

[x chest ap]
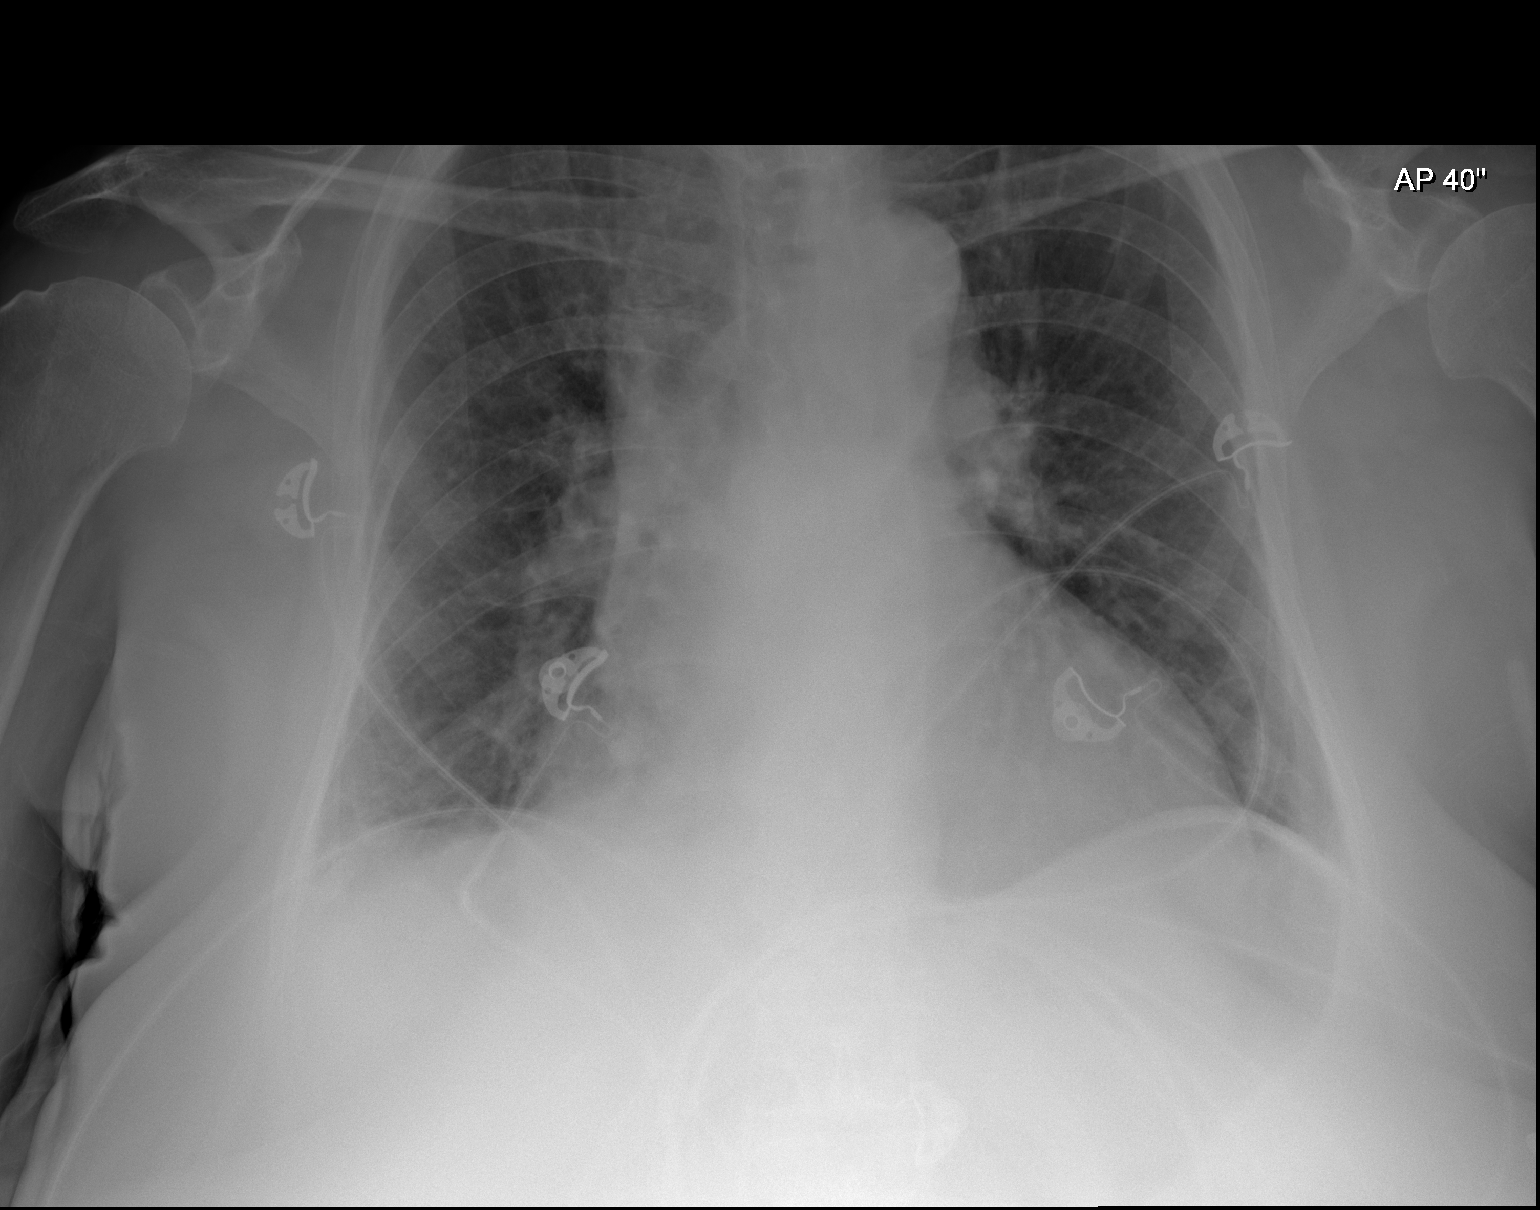

[2 of 2 positions shown; findings below may reference images not displayed]

FINDINGS: Stable moderate cardiac silhouette enlargement. Vascular pattern
normal. Lungs clear. Trace right pleural effusion.
IMPRESSION: Trace right pleural effusion, otherwise no change from prior study.

## 2016-04-09 ENCOUNTER — Other Ambulatory Visit: Payer: Self-pay | Admitting: Physician Assistant

## 2016-04-09 ENCOUNTER — Other Ambulatory Visit: Payer: Self-pay | Admitting: Cardiology

## 2016-04-09 MED ORDER — METOPROLOL SUCCINATE ER 50 MG PO TB24: 50.0000 mg | ORAL_TABLET | Freq: Two times a day (BID) | ORAL | 1 refills | Status: DC

## 2016-07-25 ENCOUNTER — Emergency Department (HOSPITAL_COMMUNITY): Payer: Medicare Other

## 2016-07-25 ENCOUNTER — Inpatient Hospital Stay (HOSPITAL_COMMUNITY)
Admission: EM | Admit: 2016-07-25 | Discharge: 2016-07-28 | DRG: 308 | Disposition: A | Discharge status: Home / Self Care | Payer: Medicare Other | Attending: Internal Medicine | Admitting: Internal Medicine

## 2016-07-25 ENCOUNTER — Encounter (HOSPITAL_COMMUNITY): Payer: Self-pay | Admitting: Emergency Medicine

## 2016-07-25 DIAGNOSIS — I110 Hypertensive heart disease with heart failure: Secondary | ICD-10-CM | POA: Diagnosis present

## 2016-07-25 DIAGNOSIS — N179 Acute kidney failure, unspecified: Secondary | ICD-10-CM | POA: Diagnosis present

## 2016-07-25 DIAGNOSIS — I4891 Unspecified atrial fibrillation: Secondary | ICD-10-CM | POA: Diagnosis not present

## 2016-07-25 DIAGNOSIS — Z888 Allergy status to other drugs, medicaments and biological substances status: Secondary | ICD-10-CM | POA: Diagnosis not present

## 2016-07-25 DIAGNOSIS — E1165 Type 2 diabetes mellitus with hyperglycemia: Secondary | ICD-10-CM | POA: Diagnosis present

## 2016-07-25 DIAGNOSIS — Z6841 Body Mass Index (BMI) 40.0 and over, adult: Secondary | ICD-10-CM | POA: Diagnosis not present

## 2016-07-25 DIAGNOSIS — I4892 Unspecified atrial flutter: Secondary | ICD-10-CM | POA: Diagnosis present

## 2016-07-25 DIAGNOSIS — Z833 Family history of diabetes mellitus: Secondary | ICD-10-CM | POA: Diagnosis not present

## 2016-07-25 DIAGNOSIS — N390 Urinary tract infection, site not specified: Secondary | ICD-10-CM | POA: Diagnosis present

## 2016-07-25 DIAGNOSIS — Z801 Family history of malignant neoplasm of trachea, bronchus and lung: Secondary | ICD-10-CM | POA: Diagnosis not present

## 2016-07-25 DIAGNOSIS — Z91199 Patient's noncompliance with other medical treatment and regimen due to unspecified reason: Secondary | ICD-10-CM

## 2016-07-25 DIAGNOSIS — Z880 Allergy status to penicillin: Secondary | ICD-10-CM

## 2016-07-25 DIAGNOSIS — Z9119 Patient's noncompliance with other medical treatment and regimen: Secondary | ICD-10-CM

## 2016-07-25 DIAGNOSIS — I9589 Other hypotension: Secondary | ICD-10-CM

## 2016-07-25 DIAGNOSIS — Z91018 Allergy to other foods: Secondary | ICD-10-CM

## 2016-07-25 DIAGNOSIS — M170 Bilateral primary osteoarthritis of knee: Secondary | ICD-10-CM | POA: Diagnosis present

## 2016-07-25 DIAGNOSIS — E872 Acidosis: Secondary | ICD-10-CM | POA: Diagnosis present

## 2016-07-25 DIAGNOSIS — Z800 Family history of malignant neoplasm of digestive organs: Secondary | ICD-10-CM | POA: Diagnosis not present

## 2016-07-25 DIAGNOSIS — I480 Paroxysmal atrial fibrillation: Secondary | ICD-10-CM | POA: Diagnosis present

## 2016-07-25 DIAGNOSIS — I428 Other cardiomyopathies: Secondary | ICD-10-CM | POA: Diagnosis present

## 2016-07-25 DIAGNOSIS — I482 Chronic atrial fibrillation, unspecified: Secondary | ICD-10-CM | POA: Diagnosis present

## 2016-07-25 DIAGNOSIS — I10 Essential (primary) hypertension: Secondary | ICD-10-CM | POA: Diagnosis not present

## 2016-07-25 DIAGNOSIS — Z794 Long term (current) use of insulin: Secondary | ICD-10-CM | POA: Diagnosis not present

## 2016-07-25 DIAGNOSIS — Z9114 Patient's other noncompliance with medication regimen: Secondary | ICD-10-CM | POA: Diagnosis not present

## 2016-07-25 DIAGNOSIS — I481 Persistent atrial fibrillation: Secondary | ICD-10-CM | POA: Diagnosis present

## 2016-07-25 DIAGNOSIS — Z7982 Long term (current) use of aspirin: Secondary | ICD-10-CM | POA: Diagnosis not present

## 2016-07-25 DIAGNOSIS — Z820 Family history of epilepsy and other diseases of the nervous system: Secondary | ICD-10-CM

## 2016-07-25 DIAGNOSIS — T782XXA Anaphylactic shock, unspecified, initial encounter: Secondary | ICD-10-CM | POA: Diagnosis not present

## 2016-07-25 DIAGNOSIS — E861 Hypovolemia: Secondary | ICD-10-CM | POA: Diagnosis present

## 2016-07-25 DIAGNOSIS — R570 Cardiogenic shock: Secondary | ICD-10-CM

## 2016-07-25 DIAGNOSIS — R0602 Shortness of breath: Secondary | ICD-10-CM | POA: Diagnosis present

## 2016-07-25 DIAGNOSIS — E669 Obesity, unspecified: Secondary | ICD-10-CM | POA: Diagnosis present

## 2016-07-25 DIAGNOSIS — IMO0001 Reserved for inherently not codable concepts without codable children: Secondary | ICD-10-CM

## 2016-07-25 DIAGNOSIS — I5023 Acute on chronic systolic (congestive) heart failure: Secondary | ICD-10-CM | POA: Diagnosis present

## 2016-07-25 LAB — CBC WITH DIFFERENTIAL/PLATELET
Basophils Absolute: 0.1 10*3/uL (ref 0.0–0.1)
Basophils Relative: 0 %
Eosinophils Absolute: 0.3 10*3/uL (ref 0.0–0.7)
Eosinophils Relative: 2 %
HCT: 49.8 % — ABNORMAL HIGH (ref 36.0–46.0)
Hemoglobin: 16.3 g/dL — ABNORMAL HIGH (ref 12.0–15.0)
Lymphocytes Relative: 12 %
Lymphs Abs: 2.3 10*3/uL (ref 0.7–4.0)
MCH: 31.6 pg (ref 26.0–34.0)
MCHC: 32.7 g/dL (ref 30.0–36.0)
MCV: 96.5 fL (ref 78.0–100.0)
Monocytes Absolute: 0.8 10*3/uL (ref 0.1–1.0)
Monocytes Relative: 4 %
Neutro Abs: 16.1 10*3/uL — ABNORMAL HIGH (ref 1.7–7.7)
Neutrophils Relative %: 82 %
Platelets: 278 10*3/uL (ref 150–400)
RBC: 5.16 MIL/uL — ABNORMAL HIGH (ref 3.87–5.11)
RDW: 17.6 % — ABNORMAL HIGH (ref 11.5–15.5)
WBC: 19.6 10*3/uL — ABNORMAL HIGH (ref 4.0–10.5)

## 2016-07-25 LAB — I-STAT CG4 LACTIC ACID, ED
Lactic Acid, Venous: 4.42 mmol/L (ref 0.5–1.9)
Lactic Acid, Venous: 2.11 mmol/L (ref 0.5–1.9)

## 2016-07-25 LAB — COMPREHENSIVE METABOLIC PANEL
ALT: 11 U/L — ABNORMAL LOW (ref 14–54)
AST: 18 U/L (ref 15–41)
Albumin: 3.9 g/dL (ref 3.5–5.0)
Alkaline Phosphatase: 57 U/L (ref 38–126)
Anion gap: 14 (ref 5–15)
BUN: 14 mg/dL (ref 6–20)
CO2: 16 mmol/L — ABNORMAL LOW (ref 22–32)
Calcium: 8.9 mg/dL (ref 8.9–10.3)
Chloride: 109 mmol/L (ref 101–111)
Creatinine, Ser: 1.27 mg/dL — ABNORMAL HIGH (ref 0.44–1.00)
GFR calc Af Amer: 50 mL/min — ABNORMAL LOW (ref >60)
GFR calc non Af Amer: 43 mL/min — ABNORMAL LOW (ref >60)
Glucose, Bld: 227 mg/dL — ABNORMAL HIGH (ref 65–99)
Potassium: 4.1 mmol/L (ref 3.5–5.1)
Sodium: 139 mmol/L (ref 135–145)
Total Bilirubin: 0.9 mg/dL (ref 0.3–1.2)
Total Protein: 7.3 g/dL (ref 6.5–8.1)

## 2016-07-25 LAB — BRAIN NATRIURETIC PEPTIDE: B Natriuretic Peptide: 209.8 pg/mL — ABNORMAL HIGH (ref 0.0–100.0)

## 2016-07-25 LAB — GLUCOSE, CAPILLARY: Glucose-Capillary: 255 mg/dL — ABNORMAL HIGH (ref 65–99)

## 2016-07-25 LAB — TROPONIN I: Troponin I: <0.03 ng/mL (ref <0.03)

## 2016-07-25 MED ORDER — SODIUM CHLORIDE 0.9 % IV BOLUS (SEPSIS)
2000.0000 mL | Freq: Once | INTRAVENOUS | Status: AC
  Administered 2016-07-25: 2000 mL via INTRAVENOUS

## 2016-07-25 MED ORDER — DEXTROSE 5 % IV SOLN: 1.0000 g | Freq: Once | INTRAVENOUS | Status: DC

## 2016-07-25 MED ORDER — SODIUM CHLORIDE 0.9 % IV SOLN
2000.0000 mg | INTRAVENOUS | Status: AC
  Administered 2016-07-25: 2000 mg via INTRAVENOUS
  Filled 2016-07-25: qty 2000

## 2016-07-25 MED ORDER — INSULIN ASPART 100 UNIT/ML ~~LOC~~ SOLN
0.0000 [IU] | Freq: Three times a day (TID) | SUBCUTANEOUS | Status: DC
  Administered 2016-07-26: 8 [IU] via SUBCUTANEOUS
  Administered 2016-07-26: 3 [IU] via SUBCUTANEOUS
  Administered 2016-07-26: 8 [IU] via SUBCUTANEOUS
  Administered 2016-07-27: 2 [IU] via SUBCUTANEOUS
  Administered 2016-07-27: 3 [IU] via SUBCUTANEOUS
  Administered 2016-07-27 – 2016-07-28 (×2): 2 [IU] via SUBCUTANEOUS

## 2016-07-25 MED ORDER — HYDROCODONE-ACETAMINOPHEN 5-325 MG PO TABS: 1.0000 | ORAL_TABLET | ORAL | Status: DC | PRN

## 2016-07-25 MED ORDER — ENOXAPARIN SODIUM 60 MG/0.6ML ~~LOC~~ SOLN
60.0000 mg | SUBCUTANEOUS | Status: DC
  Administered 2016-07-26 – 2016-07-27 (×2): 60 mg via SUBCUTANEOUS
  Filled 2016-07-25 (×2): qty 0.6

## 2016-07-25 MED ORDER — ONDANSETRON HCL 4 MG/2ML IJ SOLN: 4.0000 mg | Freq: Four times a day (QID) | INTRAMUSCULAR | Status: DC | PRN

## 2016-07-25 MED ORDER — ACETAMINOPHEN 325 MG PO TABS: 650.0000 mg | ORAL_TABLET | Freq: Four times a day (QID) | ORAL | Status: DC | PRN

## 2016-07-25 MED ORDER — SODIUM CHLORIDE 0.9% FLUSH
3.0000 mL | Freq: Two times a day (BID) | INTRAVENOUS | Status: DC
  Administered 2016-07-25 – 2016-07-28 (×6): 3 mL via INTRAVENOUS

## 2016-07-25 MED ORDER — ACETAMINOPHEN 650 MG RE SUPP
650.0000 mg | Freq: Four times a day (QID) | RECTAL | Status: DC | PRN
Start: 2016-07-25 — End: 2016-07-28

## 2016-07-25 MED ORDER — DIPHENHYDRAMINE HCL 25 MG PO CAPS: 25.0000 mg | ORAL_CAPSULE | Freq: Every day | ORAL | Status: DC | PRN

## 2016-07-25 MED ORDER — INSULIN ASPART 100 UNIT/ML ~~LOC~~ SOLN
0.0000 [IU] | Freq: Every day | SUBCUTANEOUS | Status: DC
  Administered 2016-07-25 – 2016-07-26 (×2): 3 [IU] via SUBCUTANEOUS

## 2016-07-25 MED ORDER — DEXTROSE 5 % IV SOLN
1.0000 g | Freq: Once | INTRAVENOUS | Status: AC
  Administered 2016-07-25: 1 g via INTRAVENOUS
  Filled 2016-07-25: qty 1

## 2016-07-25 MED ORDER — HYDROCORTISONE NA SUCCINATE PF 100 MG IJ SOLR
100.0000 mg | Freq: Once | INTRAMUSCULAR | Status: AC
  Administered 2016-07-25: 100 mg via INTRAVENOUS
  Filled 2016-07-25: qty 2

## 2016-07-25 MED ORDER — AMIODARONE LOAD VIA INFUSION
150.0000 mg | Freq: Once | INTRAVENOUS | Status: AC
  Administered 2016-07-25: 150 mg via INTRAVENOUS
  Filled 2016-07-25: qty 83.34

## 2016-07-25 MED ORDER — DIPHENHYDRAMINE HCL 50 MG/ML IJ SOLN
25.0000 mg | Freq: Once | INTRAMUSCULAR | Status: AC
  Administered 2016-07-25: 25 mg via INTRAVENOUS
  Filled 2016-07-25: qty 1

## 2016-07-25 MED ORDER — METHYLPREDNISOLONE SODIUM SUCC 125 MG IJ SOLR
125.0000 mg | Freq: Once | INTRAMUSCULAR | Status: AC
  Administered 2016-07-26: 125 mg via INTRAVENOUS
  Filled 2016-07-25: qty 2

## 2016-07-25 MED ORDER — AMIODARONE HCL IN DEXTROSE 360-4.14 MG/200ML-% IV SOLN
60.0000 mg/h | INTRAVENOUS | Status: AC
  Administered 2016-07-25 (×2): 60 mg/h via INTRAVENOUS
  Filled 2016-07-25: qty 200

## 2016-07-25 MED ORDER — ONDANSETRON HCL 4 MG PO TABS: 4.0000 mg | ORAL_TABLET | Freq: Four times a day (QID) | ORAL | Status: DC | PRN

## 2016-07-25 MED ORDER — AMIODARONE HCL IN DEXTROSE 360-4.14 MG/200ML-% IV SOLN
30.0000 mg/h | INTRAVENOUS | Status: DC
  Administered 2016-07-26 – 2016-07-27 (×3): 30 mg/h via INTRAVENOUS
  Filled 2016-07-25 (×4): qty 200

## 2016-07-25 MED ORDER — EPINEPHRINE 0.3 MG/0.3ML IJ SOAJ: 0.3000 mg | Freq: Once | INTRAMUSCULAR | Status: DC

## 2016-07-25 MED ORDER — SODIUM CHLORIDE 0.9 % IV SOLN
INTRAVENOUS | Status: DC
  Administered 2016-07-25: 16:00:00 via INTRAVENOUS

## 2016-07-25 MED ORDER — LOPERAMIDE HCL 2 MG PO CAPS
2.0000 mg | ORAL_CAPSULE | Freq: Every day | ORAL | Status: DC | PRN
Start: 2016-07-25 — End: 2016-07-28

## 2016-07-25 MED ORDER — VANCOMYCIN HCL IN DEXTROSE 750-5 MG/150ML-% IV SOLN
750.0000 mg | Freq: Two times a day (BID) | INTRAVENOUS | Status: DC
  Administered 2016-07-26: 750 mg via INTRAVENOUS
  Filled 2016-07-25 (×2): qty 150

## 2016-07-25 NOTE — ED Notes (Signed)
Per EMS and Pt - has not taken Cardizem x 4 months
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
## 2016-07-25 NOTE — Progress Notes (Signed)
Pharmacy Antibiotic Note  Kaitlyn [name] is a 66 y.o. female admitted on 07/25/2016 with shortness of breath and tachycardia.  Pharmacy has been consulted for Vancomycin dosing for sepsis.  Aztreonam 1g IV x1 ordered to be given now in ED.    Plan: Vancomycin 2 g IV x1 then 750 mg IV q12h Monitor daily renal function, culture results.  Vancomycin trough at steady state as needed.   Height: 5\' 11"  (180.3 cm) Weight: 260 lb (117.9 kg) (per patient's report 07/25/16) IBW/kg (Calculated) : 70.8  Temp (24hrs), Avg:97.5 F (36.4 C), Min:97.5 F (36.4 C), Max:97.5 F (36.4 C)   Recent Labs Lab 07/25/16 1545 07/25/16 1700  WBC 19.6*  --   CREATININE 1.27*  --   LATICACIDVEN  --  4.42*    Estimated Creatinine Clearance: 61.6 mL/min (by C-G formula based on SCr of 1.27 mg/dL (H)).    Allergies  Allergen Reactions  . Cranberry Shortness Of Breath, Diarrhea, Itching and Other (See Comments)    Severe headache  . Lisinopril Diarrhea, Itching and Swelling  . Caffeine Other (See Comments)    Seizure from large doses, heart races from small doses  . Penicillins Other (See Comments)    Unknown childhood allergy Has patient had a PCN reaction causing immediate rash, facial/tongue/throat swelling, SOB or lightheadedness with hypotension: unknown Has patient had a PCN reaction causing severe rash involving mucus membranes or skin necrosis: unknown Has patient had a PCN reaction that required hospitalization unknown Has patient had a PCN reaction occurring within the last 10 years: unknown If all of the above answers are "NO", then may proceed with Cephalosporin use.     Antimicrobials this admission: 3/11 Vanc>> 3/11 Aztrenam x1   Dose adjustments this admission: n/a  Microbiology results: 3/11 BCx: sent   Thank you for allowing pharmacy to be a part of this patient's care. Noah Delaine, RPh Clinical Pharmacist [phone] 07/25/2016 5:57 PM

## 2016-07-25 NOTE — ED Notes (Signed)
Attempted to call report

## 2016-07-25 NOTE — ED Provider Notes (Signed)
Assumed care in sign out. Pt arrived in afib with RVR. Hx of persistent afib and noncompliant with meds.Suspect primary issue is not her afib though. Persistent hypotension despite her HR improving to around 100. Per review of records, she has had prior admissions with similar presentations with hypotension, tachycardia and diarrhea. Presumed to be secondary to anaphylaxis previously. She does have a rash her face, but she reports that this is chronic. She denies any new exposures here more recently. Previous ECHO with poor EF but I gave her additional IVF to assess her response. She is also was steroids and Benadryl. Her blood pressure is now improved so epinephrine was deferred. She was empirically given antibiotics for possible sepsis: Not clinically convinced of this though. She does have leukocytosis but this may simply be stress demargination. She is afebrile. Her abdominal exam is fairly benign. Check UA and obtain blood cultures. Cardiology was consulted and gave recommendation including amiodarone. Will admit to medicine for ongoing management of other issues. She reports she has no PCP because she "just moved here" (two years ago).    Raeford Razor, MD 07/25/16 [id number]

## 2016-07-25 NOTE — ED Notes (Signed)
Will hold amiodarone (with MD approval) until BP more stable

## 2016-07-25 NOTE — ED Notes (Signed)
Delay in lab draw,  MD examining pt at this time.

## 2016-07-25 NOTE — ED Triage Notes (Signed)
Per EMS:  Pt presents to ED for assessment after experiencing SOB, diarrhea, headache, clammy skin.  Pt HR 180-200 on initial assessment, given 10mg  Cardizem en route, HR 150-170 at this time.  CBG 241.
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
## 2016-07-25 NOTE — ED Notes (Signed)
Patient has been made aware by MD and RN that she is unable to get out of the bed at this time.  Patient states "this will not work" and is stating she will crawl out to use the bathroom.  This RN continues to encourage patient not to get up, and patient agreeing at this time.

## 2016-07-26 ENCOUNTER — Inpatient Hospital Stay (HOSPITAL_COMMUNITY): Payer: Medicare Other

## 2016-07-26 DIAGNOSIS — I4891 Unspecified atrial fibrillation: Secondary | ICD-10-CM

## 2016-07-26 LAB — URINALYSIS, ROUTINE W REFLEX MICROSCOPIC
Bilirubin Urine: NEGATIVE
Glucose, UA: 50 mg/dL — AB
Hgb urine dipstick: NEGATIVE
Ketones, ur: NEGATIVE mg/dL
Nitrite: NEGATIVE
Protein, ur: NEGATIVE mg/dL
Specific Gravity, Urine: 1.013 (ref 1.005–1.030)
pH: 5 (ref 5.0–8.0)

## 2016-07-26 LAB — BASIC METABOLIC PANEL
Anion gap: 10 (ref 5–15)
BUN: 16 mg/dL (ref 6–20)
CO2: 20 mmol/L — ABNORMAL LOW (ref 22–32)
Calcium: 8.2 mg/dL — ABNORMAL LOW (ref 8.9–10.3)
Chloride: 109 mmol/L (ref 101–111)
Creatinine, Ser: 1.26 mg/dL — ABNORMAL HIGH (ref 0.44–1.00)
GFR calc Af Amer: 50 mL/min — ABNORMAL LOW (ref >60)
GFR calc non Af Amer: 43 mL/min — ABNORMAL LOW (ref >60)
Glucose, Bld: 226 mg/dL — ABNORMAL HIGH (ref 65–99)
Potassium: 4.6 mmol/L (ref 3.5–5.1)
Sodium: 139 mmol/L (ref 135–145)

## 2016-07-26 LAB — CBC
HCT: 38.2 % (ref 36.0–46.0)
Hemoglobin: 12.1 g/dL (ref 12.0–15.0)
MCH: 30.8 pg (ref 26.0–34.0)
MCHC: 31.7 g/dL (ref 30.0–36.0)
MCV: 97.2 fL (ref 78.0–100.0)
Platelets: 213 10*3/uL (ref 150–400)
RBC: 3.93 MIL/uL (ref 3.87–5.11)
RDW: 17.8 % — ABNORMAL HIGH (ref 11.5–15.5)
WBC: 16.8 10*3/uL — ABNORMAL HIGH (ref 4.0–10.5)

## 2016-07-26 LAB — TROPONIN I
Troponin I: <0.03 ng/mL (ref <0.03)
Troponin I: <0.03 ng/mL (ref <0.03)

## 2016-07-26 LAB — GLUCOSE, CAPILLARY
Glucose-Capillary: 157 mg/dL — ABNORMAL HIGH (ref 65–99)
Glucose-Capillary: 297 mg/dL — ABNORMAL HIGH (ref 65–99)
Glucose-Capillary: 288 mg/dL — ABNORMAL HIGH (ref 65–99)
Glucose-Capillary: 284 mg/dL — ABNORMAL HIGH (ref 65–99)

## 2016-07-26 LAB — MRSA PCR SCREENING: MRSA by PCR: NEGATIVE

## 2016-07-26 LAB — ECHOCARDIOGRAM COMPLETE
Height: 71 in
Weight: 4678.4 oz

## 2016-07-26 LAB — TSH: TSH: 1.767 u[IU]/mL (ref 0.350–4.500)

## 2016-07-26 LAB — LACTIC ACID, PLASMA
Lactic Acid, Venous: 2.4 mmol/L (ref 0.5–1.9)
Lactic Acid, Venous: 2.5 mmol/L (ref 0.5–1.9)

## 2016-07-26 MED ORDER — CIPROFLOXACIN HCL 500 MG PO TABS
250.0000 mg | ORAL_TABLET | Freq: Two times a day (BID) | ORAL | Status: DC
  Administered 2016-07-26 – 2016-07-28 (×4): 250 mg via ORAL
  Filled 2016-07-26 (×4): qty 1

## 2016-07-26 MED ORDER — FUROSEMIDE 10 MG/ML IJ SOLN: 40.0000 mg | Freq: Once | INTRAMUSCULAR | Status: DC

## 2016-07-26 MED ORDER — INSULIN GLARGINE 100 UNIT/ML ~~LOC~~ SOLN
14.0000 [IU] | Freq: Every day | SUBCUTANEOUS | Status: DC
  Administered 2016-07-26 – 2016-07-27 (×2): 14 [IU] via SUBCUTANEOUS
  Filled 2016-07-26 (×3): qty 0.14

## 2016-07-26 MED ORDER — METOPROLOL SUCCINATE ER 50 MG PO TB24
50.0000 mg | ORAL_TABLET | Freq: Once | ORAL | Status: AC
  Administered 2016-07-26: 50 mg via ORAL
  Filled 2016-07-26: qty 1

## 2016-07-26 MED ORDER — FUROSEMIDE 10 MG/ML IJ SOLN
60.0000 mg | Freq: Once | INTRAMUSCULAR | Status: DC
  Filled 2016-07-26 (×2): qty 6

## 2016-07-26 MED ORDER — METOPROLOL SUCCINATE ER 50 MG PO TB24
50.0000 mg | ORAL_TABLET | Freq: Two times a day (BID) | ORAL | Status: DC
  Administered 2016-07-27 – 2016-07-28 (×3): 50 mg via ORAL
  Filled 2016-07-26 (×3): qty 1

## 2016-07-26 MED ORDER — FUROSEMIDE 10 MG/ML IJ SOLN
60.0000 mg | Freq: Two times a day (BID) | INTRAMUSCULAR | Status: DC
  Administered 2016-07-26: 60 mg via INTRAVENOUS
  Filled 2016-07-26 (×3): qty 6

## 2016-07-26 MED ORDER — DIGOXIN 125 MCG PO TABS
0.1250 mg | ORAL_TABLET | Freq: Every day | ORAL | Status: DC
  Administered 2016-07-26 – 2016-07-28 (×3): 0.125 mg via ORAL
  Filled 2016-07-26 (×3): qty 1

## 2016-07-26 NOTE — Progress Notes (Addendum)
CRITICAL VALUE ALERT  Critical value received:  Lactic Acid 2.4 (2.11)  Date of notification:  07/26/16  Time of notification:  0059  Critical value read back: yes  Nurse who received alert:  Foye Deer RN  MD notified (1st page):  Merdis Delay NP  Time of first page:  0123  MD notified (2nd page):  Time of second page:  Responding MD:  None needed at this time. Recheck set for three hours with AM labs.  Time MD responded:

## 2016-07-26 NOTE — Progress Notes (Signed)
07/26/2016  Central monitor called reported patient had a 2.03 second pause at 0900. She was asymptomatic. [facility] RN.

## 2016-07-26 NOTE — Progress Notes (Signed)
[place] TEAM 1 - Stepdown/ICU TEAM  Kaitlyn [name]  ZOX:[id number] DOB: 04-28-1951 DOA: 07/25/2016 PCP: No PCP Per Patient    Brief Narrative:  66 y.o. female with history of Diabetes (diet controlled), seizure disorder (off medications for > 20 year), rosacea, Afib with RVR, and systolic CHF who summoned EMS due to c/o palpitations, SOB, headache, lightheadedness and diarrhea.  When EMS arrived she was noted to be in Afib with RVR.    Subjective: Patient tells me she feels much better.  She denies chest discomfort shortness of breath palpitations nausea vomiting or abdominal pain.  She is anxious to be discharged home.  Assessment & Plan:  Chronic Atrial fibrillation with acute RVR Cardiology attending to this issue - ran out of diltiazem and digoxin prescriptions - has refused DCCV - to start eliquis since now has insurance   Acute exacerbation of Chronic systolic heart failure TTE 2016 showed EF 20-25% - diuresis/care per CHF Team   Hypotension, lactic acidosis Resolved with volume resuscitation and control of RVR  Hx of angioedema w/ Lisinopril   AKI Most likely prerenal azotemia - follow trend - crt 0.94 July 2017  Recent Labs Lab 07/25/16 1545 07/26/16 0230  CREATININE 1.27* 1.26*   DM2 CBG poorly controlled - adjust tx and follow trend - likely related to large dose steroid given at admit   UTI Cont empiric abx coverage while waiting on urine culture to mature   Obesity - Body mass index is 40.78 kg/m.   Noncompliance w/ medications or medical care Well known to CHF Team - refuses to visit PCP/Outpt Cards clinic   DVT prophylaxis: lovenox Code Status: FULL CODE Family Communication: no family present at time of exam  Disposition Plan:   Consultants:  [facility] Card[facility]   Procedures: none  Antimicrobials:  Cipro 3/12 >   Objective: Blood pressure 121/84, pulse 98, temperature 97.9 F (36.6 C), temperature source Oral, resp. rate 15, height 5\' 11"   (1.803 m), weight 132.6 kg (292 lb 6.4 oz), SpO2 96 %.  Intake/Output Summary (Last 24 hours) at 07/26/16 1705 Last data filed at 07/26/16 1300  Gross per 24 hour  Intake          5999.24 ml  Output              700 ml  Net          5299.24 ml   Filed Weights   07/25/16 1700 07/25/16 2320  Weight: 117.9 kg (260 lb) 132.6 kg (292 lb 6.4 oz)    Examination: General: No acute respiratory distress Lungs: Clear to auscultation bilaterally without wheezes or crackles Cardiovascular: Regular rate without murmur gallop or rub normal S1 and S2 Abdomen: Nontender, obese, soft, bowel sounds positive, no rebound, no ascites, no appreciable mass Extremities: 1+ edema bilateral lower extremities  CBC:  Recent Labs Lab 07/25/16 1545 07/26/16 0230  WBC 19.6* 16.8*  NEUTROABS 16.1*  --   HGB 16.3* 12.1  HCT 49.8* 38.2  MCV 96.5 97.2  PLT 278 213   Basic Metabolic Panel:  Recent Labs Lab 07/25/16 1545 07/26/16 0230  NA 139 139  K 4.1 4.6  CL 109 109  CO2 16* 20*  GLUCOSE 227* 226*  BUN 14 16  CREATININE 1.27* 1.26*  CALCIUM 8.9 8.2*   GFR: Estimated Creatinine Clearance: 66.2 mL/min (by C-G formula based on SCr of 1.26 mg/dL (H)).  Liver Function Tests:  Recent Labs Lab 07/25/16 1545  AST 18  ALT 11*  ALKPHOS  57  BILITOT 0.9  PROT 7.3  ALBUMIN 3.9    Cardiac Enzymes:  Recent Labs Lab 07/25/16 1545 07/25/16 2329 07/26/16 0230  TROPONINI <0.03 <0.03 <0.03    HbA1C: Hgb A1c MFr Bld  Date/Time Value Ref Range Status  12/04/2015 10:52 AM 8.2 (H) 4.8 - 5.6 % Final    Comment:    (NOTE)         Pre-diabetes: 5.7 - 6.4         Diabetes: >6.4         Glycemic control for adults with diabetes: <7.0   12/01/2015 04:08 AM 8.0 (H) 4.8 - 5.6 % Final    Comment:    (NOTE)         Pre-diabetes: 5.7 - 6.4         Diabetes: >6.4         Glycemic control for adults with diabetes: <7.0     CBG:  Recent Labs Lab 07/25/16 2336 07/26/16 0753 07/26/16 1250    GLUCAP 255* 157* 297*    Recent Results (from the past 240 hour(s))  Blood culture (routine x 2)     Status: None (Preliminary result)   Collection Time: 07/25/16  5:31 PM  Result Value Ref Range Status   Specimen Description BLOOD LEFT HAND  Final   Special Requests AEROBIC BOTTLE ONLY 10CC  Final   Culture NO GROWTH < 24 HOURS  Final   Report Status PENDING  Incomplete  Blood culture (routine x 2)     Status: None (Preliminary result)   Collection Time: 07/25/16  5:33 PM  Result Value Ref Range Status   Specimen Description BLOOD LEFT FOREARM  Final   Special Requests AEROBIC BOTTLE ONLY 10CC  Final   Culture NO GROWTH < 24 HOURS  Final   Report Status PENDING  Incomplete  MRSA PCR Screening     Status: None   Collection Time: 07/26/16  2:51 AM  Result Value Ref Range Status   MRSA by PCR NEGATIVE NEGATIVE Final    Comment:        The GeneXpert MRSA Assay (FDA approved for NASAL specimens only), is one component of a comprehensive MRSA colonization surveillance program. It is not intended to diagnose MRSA infection nor to guide or monitor treatment for MRSA infections.      Scheduled Meds: . ciprofloxacin  250 mg Oral BID  . digoxin  0.125 mg Oral Daily  . enoxaparin (LOVENOX) injection  60 mg Subcutaneous Q24H  . furosemide  60 mg Intravenous BID  . furosemide  60 mg Intravenous Once  . insulin aspart  0-15 Units Subcutaneous TID WC  . insulin aspart  0-5 Units Subcutaneous QHS  . [START ON 07/27/2016] metoprolol succinate  50 mg Oral BID  . sodium chloride flush  3 mL Intravenous Q12H   Continuous Infusions: . amiodarone 30 mg/hr (07/26/16 1530)     LOS: 1 day   Lonia Blood, MD [facility] Office  [phone] Pager - Text Page per Loretha Stapler as per below:  On-Call/Text Page:      [email or web address]      password [email or web address]  If 7PM-7AM, please contact night-coverage [email or web address] Password [facility] 07/26/2016, 5:05 PM
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
## 2016-07-26 NOTE — Progress Notes (Signed)
07/26/2016  Patient refuse to take Lasix 60mg  one time dose. Stated it's too late, rather take in AM. Lovie Macadamia RN.

## 2016-07-26 NOTE — Plan of Care (Signed)
Problem: Activity: Goal: Ability to tolerate increased activity will improve Outcome: Progressing Discussed with patient about not getting up without assistance due to all the connections and cords with some teach back displayed.  Comments: Obtained a larger bedside commode and meal for the patient as well.

## 2016-07-26 NOTE — Progress Notes (Signed)
  Echocardiogram 2D Echocardiogram has been performed.  Arvil Chaco 07/26/2016, 3:41 PM

## 2016-07-27 LAB — URINE CULTURE: Culture: NO GROWTH

## 2016-07-27 LAB — CBC WITH DIFFERENTIAL/PLATELET
Basophils Absolute: 0 10*3/uL (ref 0.0–0.1)
Basophils Relative: 0 %
Eosinophils Absolute: 0 10*3/uL (ref 0.0–0.7)
Eosinophils Relative: 0 %
HCT: 39.2 % (ref 36.0–46.0)
Hemoglobin: 12.6 g/dL (ref 12.0–15.0)
Lymphocytes Relative: 7 %
Lymphs Abs: 1.2 10*3/uL (ref 0.7–4.0)
MCH: 30.7 pg (ref 26.0–34.0)
MCHC: 32.1 g/dL (ref 30.0–36.0)
MCV: 95.6 fL (ref 78.0–100.0)
Monocytes Absolute: 1 10*3/uL (ref 0.1–1.0)
Monocytes Relative: 6 %
Neutro Abs: 14.4 10*3/uL — ABNORMAL HIGH (ref 1.7–7.7)
Neutrophils Relative %: 87 %
Platelets: 226 10*3/uL (ref 150–400)
RBC: 4.1 MIL/uL (ref 3.87–5.11)
RDW: 17.7 % — ABNORMAL HIGH (ref 11.5–15.5)
WBC: 16.6 10*3/uL — ABNORMAL HIGH (ref 4.0–10.5)

## 2016-07-27 LAB — GLUCOSE, CAPILLARY
Glucose-Capillary: 188 mg/dL — ABNORMAL HIGH (ref 65–99)
Glucose-Capillary: 149 mg/dL — ABNORMAL HIGH (ref 65–99)
Glucose-Capillary: 145 mg/dL — ABNORMAL HIGH (ref 65–99)
Glucose-Capillary: 117 mg/dL — ABNORMAL HIGH (ref 65–99)

## 2016-07-27 LAB — BASIC METABOLIC PANEL
Anion gap: 11 (ref 5–15)
BUN: 22 mg/dL — ABNORMAL HIGH (ref 6–20)
CO2: 23 mmol/L (ref 22–32)
Calcium: 9.2 mg/dL (ref 8.9–10.3)
Chloride: 104 mmol/L (ref 101–111)
Creatinine, Ser: 1.25 mg/dL — ABNORMAL HIGH (ref 0.44–1.00)
GFR calc Af Amer: 51 mL/min — ABNORMAL LOW (ref >60)
GFR calc non Af Amer: 44 mL/min — ABNORMAL LOW (ref >60)
Glucose, Bld: 205 mg/dL — ABNORMAL HIGH (ref 65–99)
Potassium: 4.1 mmol/L (ref 3.5–5.1)
Sodium: 138 mmol/L (ref 135–145)

## 2016-07-27 LAB — HEMOGLOBIN A1C
Hgb A1c MFr Bld: 6.9 % — ABNORMAL HIGH (ref 4.8–5.6)
Mean Plasma Glucose: 151 mg/dL

## 2016-07-27 MED ORDER — DILTIAZEM HCL ER COATED BEADS 240 MG PO CP24
240.0000 mg | ORAL_CAPSULE | Freq: Every day | ORAL | Status: DC
  Administered 2016-07-27 – 2016-07-28 (×2): 240 mg via ORAL
  Filled 2016-07-27 (×2): qty 1

## 2016-07-27 MED ORDER — APIXABAN 5 MG PO TABS
5.0000 mg | ORAL_TABLET | Freq: Two times a day (BID) | ORAL | Status: DC
  Administered 2016-07-27 – 2016-07-28 (×3): 5 mg via ORAL
  Filled 2016-07-27 (×3): qty 1

## 2016-07-27 MED ORDER — ALUM & MAG HYDROXIDE-SIMETH 200-200-20 MG/5ML PO SUSP
30.0000 mL | ORAL | Status: DC | PRN
  Administered 2016-07-27: 30 mL via ORAL
  Filled 2016-07-27: qty 30

## 2016-07-27 NOTE — Progress Notes (Signed)
PROGRESS NOTE    Kaitlyn [name]  SNK:[id number] DOB: Dec 15, 1950 DOA: 07/25/2016 PCP: No PCP Per Patient   Brief Narrative:  66 year old female with past medical history of diabetes which is diet-controlled, seizures has been off medications for over 20 years, atrial fibrillation with RVR, rosacea and systolic CHF came to the ED with complaints of palpitations and shortness of breath. She was found to be in A. fib with RVR. Right after the admission her diarrhea had resolved within 12 hours. Heart failure team has been following the patient.   Assessment & Plan:   Principal Problem:   Atrial fibrillation with rapid ventricular response (HCC) Active Problems:   Obesity (BMI 30-39.9)   H/O noncompliance with medical treatment, presenting hazards to health   Chronic atrial fibrillation (HCC)   Essential hypertension   Acute on chronic systolic heart failure (HCC)   Hypotension   Anaphylactic reaction   Type 2 diabetes mellitus with hyperglycemia, with long-term current use of insulin (HCC)   Cardiogenic shock (HCC)   Atrial fibrillation with RVR (HCC)   Atrial fibrillation with RVR with history of persistent A. fib -Patient states she ran out of medications but there has been concerns of her noncompliance -Eliquis has been started here. Cardiology recommends against given her extra refills at the time of discharge given her poor compliance along with digoxin. It can be refilled when she follows up outpatient. -Amiodarone drip has been turned off. -Echocardiogram showed ejection fraction of 30-35 percent with diffuse hypokinesis.  Acute on chronic systolic congestive heart failure-ejection fraction of 3035%. Slightly improved -She appears to be getting close to euvolemic. One more day of IV diuresis and transition her to oral by mouth Lasix tomorrow. -Continue  digoxin 0.125 mg daily -Monitor creatinine -Not a lisinopril due to history of angioedema  Acute kidney injury -  stable? -Her creatinine appears stable around 1.2. -Avoid nephrotoxic drugs. Holding off on IV fluids at this time given slight volume overload -Continue to monitor creatinine  Lab Results  Component Value Date   CREATININE 1.25 (H) 07/27/2016   CREATININE 1.26 (H) 07/26/2016   CREATININE 1.27 (H) 07/25/2016     Lactic acidosis -Likely from hypoperfusion. Unable to give more IV fluid at this time due to reasons stated above. -We'll continue to monitor for now.  Urinary tract infection -Continue antibiotics on Cipro.  Diabetes type 2 -Blood sugar appears to be more stable this morning. -Her A1c is 6.9. She can likely controlled this with weight loss and dietary compliance.  Medical noncompliance-palate patient has not been following outpatient with primary care physician and outpatient cardiology clinic.  Obesity with BMI over 40.    DVT prophylaxis:  Lovenox  Code Status:  full  Family Communication:   no family present at bedside. Patient comprehends well.  Disposition Plan: I will transfer her to telemetry. Likely discharge her in next 24 hours if continues to remain stable.   Consultants:   Cardiology  Procedures:   None  Antimicrobials:   Cipro started on 07/26/2016   Subjective: Patient states she feels a whole lot better this morning. Her heart rate has been controlled and does not feel any palpitations. Tolerating oral diet well.   Objective: Vitals:   07/27/16 1000 07/27/16 1023 07/27/16 1156 07/27/16 1312  BP: 128/80 128/80  (!) 149/91  Pulse: 88 95    Resp: 16     Temp:   97.5 F (36.4 C)   TempSrc:   Oral   SpO2: 97%  Weight:      Height:        Intake/Output Summary (Last 24 hours) at 07/27/16 1406 Last data filed at 07/27/16 1300  Gross per 24 hour  Intake          1746.88 ml  Output             3850 ml  Net         -2103.12 ml   Filed Weights   07/25/16 1700 07/25/16 2320  Weight: 117.9 kg (260 lb) 132.6 kg (292 lb 6.4 oz)     Examination:  General exam: Appears calm and comfortable  Respiratory system: Clear to auscultation. Respiratory effort normal. Cardiovascular system: S1 & S2 heard, RRR. JVD 10cm, murmurs, rubs, gallops or clicks. trace pedal edema. Gastrointestinal system: Abdomen is nondistended, soft and nontender. No organomegaly or masses felt. Normal bowel sounds heard. Central nervous system: Alert and oriented. No focal neurological deficits. Extremities: Symmetric 5 x 5 power. Skin: No rashes, lesions or ulcers Psychiatry: Judgement and insight appear normal. Mood & affect appropriate.     Data Reviewed:   CBC:  Recent Labs Lab 07/25/16 1545 07/26/16 0230 07/27/16 0324  WBC 19.6* 16.8* 16.6*  NEUTROABS 16.1*  --  14.4*  HGB 16.3* 12.1 12.6  HCT 49.8* 38.2 39.2  MCV 96.5 97.2 95.6  PLT 278 213 226   Basic Metabolic Panel:  Recent Labs Lab 07/25/16 1545 07/26/16 0230 07/27/16 0324  NA 139 139 138  K 4.1 4.6 4.1  CL 109 109 104  CO2 16* 20* 23  GLUCOSE 227* 226* 205*  BUN 14 16 22*  CREATININE 1.27* 1.26* 1.25*  CALCIUM 8.9 8.2* 9.2   GFR: Estimated Creatinine Clearance: 66.7 mL/min (by C-G formula based on SCr of 1.25 mg/dL (H)). Liver Function Tests:  Recent Labs Lab 07/25/16 1545  AST 18  ALT 11*  ALKPHOS 57  BILITOT 0.9  PROT 7.3  ALBUMIN 3.9   No results for input(s): LIPASE, AMYLASE in the last 168 hours. No results for input(s): AMMONIA in the last 168 hours. Coagulation Profile: No results for input(s): INR, PROTIME in the last 168 hours. Cardiac Enzymes:  Recent Labs Lab 07/25/16 1545 07/25/16 2329 07/26/16 0230  TROPONINI <0.03 <0.03 <0.03   BNP (last 3 results) No results for input(s): PROBNP in the last 8760 hours. HbA1C:  Recent Labs  07/25/16 2329  HGBA1C 6.9*   CBG:  Recent Labs Lab 07/26/16 1250 07/26/16 1735 07/26/16 2244 07/27/16 0802 07/27/16 1153  GLUCAP 297* 288* 284* 188* 149*   Lipid Profile: No results  for input(s): CHOL, HDL, LDLCALC, TRIG, CHOLHDL, LDLDIRECT in the last 72 hours. Thyroid Function Tests:  Recent Labs  07/25/16 2329  TSH 1.767   Anemia Panel: No results for input(s): VITAMINB12, FOLATE, FERRITIN, TIBC, IRON, RETICCTPCT in the last 72 hours. Sepsis Labs:  Recent Labs Lab 07/25/16 1700 07/25/16 2055 07/25/16 2329 07/26/16 0230  LATICACIDVEN 4.42* 2.11* 2.4* 2.5*    Recent Results (from the past 240 hour(s))  Culture, Urine     Status: None   Collection Time: 07/25/16 11:00 AM  Result Value Ref Range Status   Specimen Description URINE, RANDOM  Final   Special Requests NONE  Final   Culture NO GROWTH  Final   Report Status 07/27/2016 FINAL  Final  Blood culture (routine x 2)     Status: None (Preliminary result)   Collection Time: 07/25/16  5:31 PM  Result Value Ref Range Status  Specimen Description BLOOD LEFT HAND  Final   Special Requests AEROBIC BOTTLE ONLY 10CC  Final   Culture NO GROWTH < 24 HOURS  Final   Report Status PENDING  Incomplete  Blood culture (routine x 2)     Status: None (Preliminary result)   Collection Time: 07/25/16  5:33 PM  Result Value Ref Range Status   Specimen Description BLOOD LEFT FOREARM  Final   Special Requests AEROBIC BOTTLE ONLY 10CC  Final   Culture NO GROWTH < 24 HOURS  Final   Report Status PENDING  Incomplete  MRSA PCR Screening     Status: None   Collection Time: 07/26/16  2:51 AM  Result Value Ref Range Status   MRSA by PCR NEGATIVE NEGATIVE Final    Comment:        The GeneXpert MRSA Assay (FDA approved for NASAL specimens only), is one component of a comprehensive MRSA colonization surveillance program. It is not intended to diagnose MRSA infection nor to guide or monitor treatment for MRSA infections.          Radiology Studies: Dg Chest Port 1 View  Result Date: 07/25/2016 CLINICAL DATA:  Shortness of breath and atrial fibrillation. EXAM: PORTABLE CHEST 1 VIEW COMPARISON:  12/04/2015 and  prior radiographs FINDINGS: Cardiomegaly noted. There is no evidence of focal airspace disease, pulmonary edema, suspicious pulmonary nodule/mass, pleural effusion, or pneumothorax. No acute bony abnormalities are identified. IMPRESSION: Cardiomegaly without evidence of acute cardiopulmonary disease. Electronically Signed   By: Harmon Pier M.D.   On: 07/25/2016 16:11        Scheduled Meds: . apixaban  5 mg Oral BID  . ciprofloxacin  250 mg Oral BID  . digoxin  0.125 mg Oral Daily  . diltiazem  240 mg Oral Daily  . furosemide  60 mg Intravenous BID  . furosemide  60 mg Intravenous Once  . insulin aspart  0-15 Units Subcutaneous TID WC  . insulin aspart  0-5 Units Subcutaneous QHS  . insulin glargine  14 Units Subcutaneous QHS  . metoprolol succinate  50 mg Oral BID  . sodium chloride flush  3 mL Intravenous Q12H   Continuous Infusions:   LOS: 2 days    Time spent: 35 mins     Airabella Barley Joline Maxcy, MD [facility] Pager [phone]   If 7PM-7AM, please contact night-coverage [email or web address] Password [email or web address] 07/27/2016, 2:06 PM

## 2016-07-27 NOTE — Progress Notes (Signed)
Advanced Heart Failure Rounding Note  Referring Physician: Dr. Rennis Golden Primary Physician: None Primary Cardiologist: Dr. Shirlee Latch     Reason for Consultation: Afib RVR, A/C Systolic CHF  Subjective:    Feeling good this am.  Would like to go home, but willing to stay at least one more day. States she hadn't taken lasix at home "in years" and would only take 40 mg at any one time. Denies SOB this am. No further CP. No diarrhea.   Great urine output thus far with IV lasix, though remains positive overall this admission.  No weights.   Rate much improved this am. In 70s down from 110s on amio gtt and with resumption of Toprol.   Objective:   Weight Range: 292 lb 6.4 oz (132.6 kg) Body mass index is 40.78 kg/m.   Vital Signs:   Temp:  [97.4 F (36.3 C)-98 F (36.7 C)] 97.4 F (36.3 C) (03/13 0805) Pulse Rate:  [59-117] 95 (03/13 1023) Resp:  [13-23] 16 (03/13 1000) BP: (121-156)/(80-104) 128/80 (03/13 1023) SpO2:  [89 %-99 %] 97 % (03/13 1000) Last BM Date: 07/26/16  Weight change: Filed Weights   07/25/16 1700 07/25/16 2320  Weight: 260 lb (117.9 kg) 292 lb 6.4 oz (132.6 kg)    Intake/Output:   Intake/Output Summary (Last 24 hours) at 07/27/16 1120 Last data filed at 07/27/16 1024  Gross per 24 hour  Intake          1866.88 ml  Output             4450 ml  Net         -2583.12 ml     Physical Exam: General:  Well appearing. NAD.  HEENT: Normal.  Neck: supple. JVP 8-9 cm. Carotids 2+ bilat; no bruits. No lymphadenopathy or thyromegaly appreciated. Cor: PMI nondisplaced. Irregular. Rate stable. No M/G/R appreciated.  Lungs: CTAB, normal effort Abdomen: Obese, soft, NT, ND, no HSM. No bruits or masses. +BS  Extremities: no cyanosis, clubbing, rash, Trace to 1+ ankle edema.  Neuro: alert & orientedx3, cranial nerves grossly intact. moves all 4 extremities w/o difficulty. Affect pleasant  Telemetry: Reviewed personally, Atrial fibrillation  70-80s  Labs: CBC  Recent Labs  07/25/16 1545 07/26/16 0230 07/27/16 0324  WBC 19.6* 16.8* 16.6*  NEUTROABS 16.1*  --  14.4*  HGB 16.3* 12.1 12.6  HCT 49.8* 38.2 39.2  MCV 96.5 97.2 95.6  PLT 278 213 226   Basic Metabolic Panel  Recent Labs  07/26/16 0230 07/27/16 0324  NA 139 138  K 4.6 4.1  CL 109 104  CO2 20* 23  GLUCOSE 226* 205*  BUN 16 22*  CREATININE 1.26* 1.25*  CALCIUM 8.2* 9.2   Liver Function Tests  Recent Labs  07/25/16 1545  AST 18  ALT 11*  ALKPHOS 57  BILITOT 0.9  PROT 7.3  ALBUMIN 3.9   No results for input(s): LIPASE, AMYLASE in the last 72 hours. Cardiac Enzymes  Recent Labs  07/25/16 1545 07/25/16 2329 07/26/16 0230  TROPONINI <0.03 <0.03 <0.03   BNP: BNP (last 3 results)  Recent Labs  11/30/15 2250 12/04/15 0520 07/25/16 1545  BNP 94.4 113.8* 209.8*   ProBNP (last 3 results) No results for input(s): PROBNP in the last 8760 hours.  D-Dimer No results for input(s): DDIMER in the last 72 hours. Hemoglobin A1C  Recent Labs  07/25/16 2329  HGBA1C 6.9*   Fasting Lipid Panel No results for input(s): CHOL, HDL, LDLCALC, TRIG, CHOLHDL, LDLDIRECT in  the last 72 hours. Thyroid Function Tests  Recent Labs  07/25/16 2329  TSH 1.767   Other results:  Imaging/Studies:   No results found.  Medications:    Scheduled Medications: . ciprofloxacin  250 mg Oral BID  . digoxin  0.125 mg Oral Daily  . enoxaparin (LOVENOX) injection  60 mg Subcutaneous Q24H  . furosemide  60 mg Intravenous BID  . furosemide  60 mg Intravenous Once  . insulin aspart  0-15 Units Subcutaneous TID WC  . insulin aspart  0-5 Units Subcutaneous QHS  . insulin glargine  14 Units Subcutaneous QHS  . metoprolol succinate  50 mg Oral BID  . sodium chloride flush  3 mL Intravenous Q12H     Infusions: . amiodarone 30 mg/hr (07/27/16 0300)     PRN Medications:  acetaminophen **OR** acetaminophen, alum & mag hydroxide-simeth,  diphenhydrAMINE, HYDROcodone-acetaminophen, loperamide, ondansetron **OR** ondansetron (ZOFRAN) IV   Assessment/Plan   Kaitlyn [name] is a 66 y.o. female with permanent Afib and presumed NICM. Has repeatedly refused any invasive work up, cardioversion, etc.  Has long history of non-compliance to outpatient follow up.  Last admitted 8 months ago.  Recently ran out of her medications. HF team consulted with Afib RVR and volume overload.  1. Atrial Fibrillation; Permanent - Rate much improved on amio and with resumption of digoxin and Toprol Xl. - Diltiazem CD not ideal with HF, but pt fixated on this medication since it was started, and is largely non-compliant with her medications when it is refused.  Off of it, her HR becomes very high.  - Has repeatedly refused cardioversion.  - Stop amio gtt. - Restart Diltiazem CD 240 mg daily.  - Pt has agreed to take Eliquis since she now has insurance. Will start Eliquis 5 mg BID.   With non-compliance, will give 1 month supply on discharge.  - CHA2DS2/VASc is at least 5.  2. Acute on chronic systolic CHF: Echo 07/26/16 LVEF 30-35%. Improved from 20-25% from 09/2014. - Remains at least mildly volume overloaded on exam and positive for this admission.  - Would continue IV lasix 60 mg BID today. Pt states she is not willing to take any more than 40 mg of po lasix daily at home.  Would plan on sending home on that dosage.  - Continue digoxin 0.125 mg daily. Will need level checked as outpatient.  With non-compliance, will not give more than 1 month supply to start.  3. UTI - On Cipro. Per primary - WBC 16.6. 4. Type II DM - Needs set up with PCP. Will have Case management work on.    Stopping amio gtt and adding back diltiazem.  Continue to follow on tele. Continue IV lasix at least today. Watch creatinine closely.   Length of Stay: 2  [name]  07/27/2016, 11:20 AM  Advanced Heart Failure Team Pager [phone] (M-F; 7a - 4p)   Please contact [facility] for night-coverage after hours (4p -7a ) and weekends on [email or web address]  Patient seen with PA, agree with the above note.  HR much better controlled back on home regimen.  Now off IV amiodarone.  She has been started on Eliquis and says that she will followup with [place] this time.   - Will not give refills of Eliquis or digoxin as these meds will not be safe for her to take if she does not followup as an outpatient (will have to come to appt to get refills).   Echo with EF  30-35%, diffuse hypokinesis.  She diuresed yesterday, volume looks better today (still mildly overloaded).  Will give IV Lasix today, transition to Lasix 40 mg po daily tomorrow.   I suspect she can go home tomorrow.  Will need help with transportation to get to her CHF clinic followup.   [name] 07/27/2016 1:33 PM

## 2016-07-27 NOTE — Plan of Care (Signed)
Problem: Education: Goal: Knowledge of [facility] Education information/materials will improve Outcome: Progressing Discussed fluid and food intake with patient while in the hospital with some teach back displayed.

## 2016-07-27 NOTE — Care Management Note (Signed)
Case Management Note  Patient Details  Name: Shekena Baudo MRN: [id number] Date of Birth: [birth year]  Subjective/Objective:                 Spoke with patient at the bedside. She was provided Eliquis coupon for 30 days free. Patient verbalized understanding for use. Patient stated she expected to have all medications filled and provided to her for free at time of discharge. CM explained this was not a possibility, as the hospital does not provide this service. Explained that coupon was extent of medication assistance hat could be provided at this time as she has insurance. Patient states that she has policy for medications to be filled at around $3 each. Benefit check for Eliquis confirmed that all Rx would cost roughly btwn $3-9.00. Patient expressed need for transportation assistance at time of discharge. CSW consult placed. Patient provided with [facility] pamphlet to establish PCP at DC, although she stated she does not want a PCP and does not see a need to go to any other MD than cardiologist.    Action/Plan:   Expected Discharge Date:  07/29/16               Expected Discharge Plan:     In-House Referral:     Discharge planning Services  CM Consult  Post Acute Care Choice:    Choice offered to:     DME Arranged:    DME Agency:     HH Arranged:    HH Agency:     Status of Service:  In process, will continue to follow  If discussed at Long Length of Stay Meetings, dates discussed:    Additional Comments:  Lawerance Sabal, RN 07/27/2016, 12:25 PM

## 2016-07-27 NOTE — Progress Notes (Addendum)
Benefit check sent for Eliquis  Megan P Shular  Lawerance Sabal, RN        Per rep at [facility] Loraine Leriche:   Eliquis is covered, no auth required. Patient can use any pharmacy. Patient can get either a 30 day or 90 day supply at retail for $8.35 for either.   Patient has rx assistance so any prescriptions would have a co-pay of either $3.70 or $8.35

## 2016-07-28 LAB — BASIC METABOLIC PANEL
Anion gap: 9 (ref 5–15)
BUN: 22 mg/dL — ABNORMAL HIGH (ref 6–20)
CO2: 24 mmol/L (ref 22–32)
Calcium: 8.5 mg/dL — ABNORMAL LOW (ref 8.9–10.3)
Chloride: 105 mmol/L (ref 101–111)
Creatinine, Ser: 1.1 mg/dL — ABNORMAL HIGH (ref 0.44–1.00)
GFR calc Af Amer: 59 mL/min — ABNORMAL LOW (ref >60)
GFR calc non Af Amer: 51 mL/min — ABNORMAL LOW (ref >60)
Glucose, Bld: 157 mg/dL — ABNORMAL HIGH (ref 65–99)
Potassium: 3.6 mmol/L (ref 3.5–5.1)
Sodium: 138 mmol/L (ref 135–145)

## 2016-07-28 LAB — GLUCOSE, CAPILLARY
Glucose-Capillary: 90 mg/dL (ref 65–99)
Glucose-Capillary: 126 mg/dL — ABNORMAL HIGH (ref 65–99)

## 2016-07-28 MED ORDER — DILTIAZEM HCL ER COATED BEADS 240 MG PO CP24: 240.0000 mg | ORAL_CAPSULE | Freq: Every day | ORAL | 0 refills | Status: DC

## 2016-07-28 MED ORDER — DIGOXIN 125 MCG PO TABS: 0.1250 mg | ORAL_TABLET | Freq: Every day | ORAL | 0 refills | Status: DC

## 2016-07-28 MED ORDER — APIXABAN 5 MG PO TABS: 5.0000 mg | ORAL_TABLET | Freq: Two times a day (BID) | ORAL | 0 refills | Status: DC

## 2016-07-28 MED ORDER — INSULIN GLARGINE 100 UNIT/ML ~~LOC~~ SOLN
8.0000 [IU] | Freq: Every day | SUBCUTANEOUS | Status: DC
  Filled 2016-07-28: qty 0.08

## 2016-07-28 MED ORDER — METOPROLOL SUCCINATE ER 50 MG PO TB24: 50.0000 mg | ORAL_TABLET | Freq: Two times a day (BID) | ORAL | 0 refills | Status: DC

## 2016-07-28 MED ORDER — FUROSEMIDE 40 MG PO TABS: 40.0000 mg | ORAL_TABLET | Freq: Every day | ORAL | Status: DC

## 2016-07-28 MED ORDER — FUROSEMIDE 40 MG PO TABS: 40.0000 mg | ORAL_TABLET | Freq: Every day | ORAL | 0 refills | Status: DC

## 2016-07-28 MED ORDER — POTASSIUM CHLORIDE ER 20 MEQ PO TBCR: 20.0000 meq | EXTENDED_RELEASE_TABLET | Freq: Every day | ORAL | 0 refills | Status: DC

## 2016-07-28 MED FILL — ELIQUIS 5 MG TABLET: 5 | 30 days supply | Qty: 60 | Fill #0

## 2016-07-28 MED FILL — FUROSEMIDE 40 MG TABLET: 40 | 90 days supply | Qty: 90 | Fill #0

## 2016-07-28 MED FILL — DILT-XR 240 MG CAP SA: 240 | 30 days supply | Qty: 30 | Fill #0 | Status: TO

## 2016-07-28 MED FILL — DIGITEK 125 MCG TABLET: 125 | 34 days supply | Qty: 34 | Fill #0 | Status: TO

## 2016-07-28 NOTE — Progress Notes (Signed)
Inpatient Diabetes Program Recommendations  AACE/ADA: New Consensus Statement on Inpatient Glycemic Control (2015)  Target Ranges:  Prepandial:   less than 140 mg/dL      Peak postprandial:   less than 180 mg/dL (1-2 hours)      Critically ill patients:  140 - 180 mg/dL   Results for Kaitlyn [name], Kaitlyn [name] (MRN [id number]) as of 07/28/2016 09:38  Ref. Range 07/28/2016 07:47  Glucose-Capillary Latest Ref Range: 65 - 99 mg/dL 90   Results for Kaitlyn [name], Kaitlyn [name] (MRN [id number]) as of 07/28/2016 09:38  Ref. Range 07/25/2016 23:29  Hemoglobin A1C Latest Ref Range: 4.8 - 5.6 % 6.9 (H)    Home DM Meds: None- Diet Controlled  Current Insulin Orders: Lantus 14 units QHS      Novolog Moderate Correction Scale/ SSI (0-15 units) TID AC + HS      MD- Note last dose of IV steroids given in the morning on 03/12.  AM CBG today: 90 mg/dl.  Please consider reducing Lantus to 7 units QHS (50% reduction) since no more steroids ordered at this time.     --Will follow patient during hospitalization--  Ambrose Finland RN, MSN, CDE Diabetes Coordinator Inpatient Glycemic Control Team Team Pager: [phone] (8a-5p)

## 2016-07-28 NOTE — Care Management Note (Signed)
Case Management Note  Patient Details  Name: Kaitlyn [name] MRN: [id number] Date of Birth: 1950/09/12  Subjective/Objective: Pt presented for Atrial Fib. Previous CM did provide pt with Eliquis 30 day free coupon. Pt has pharmacy that delivers the medication to home. Pt now has Medicare. Pt will plan for d/c home today via [facility].                   Action/Plan: Pt refusing any need for [facility] at this time. Heart Failure Navigator did speak with pt before d/c. CM will make sure she has transportation via PTAR. No further needs from CM a this time.  Expected Discharge Date:  07/28/16               Expected Discharge Plan:  Home/Self Care  In-House Referral:  NA  Discharge planning Services  CM Consult, Medication Assistance, HF Clinic  Post Acute Care Choice:  NA Choice offered to:  NA  DME Arranged:  N/A DME Agency:  NA  HH Arranged:  NA HH Agency:  NA  Status of Service:  Completed, signed off  If discussed at Long Length of Stay Meetings, dates discussed:    Additional Comments:  Gala Lewandowsky, RN 07/28/2016, 12:15 PM

## 2016-07-28 NOTE — Progress Notes (Addendum)
I have provided the following medications to patient filled through the HF Fund.  She also has an AHF Clinic appointment set -up for April 16th at 1100 AM.  I will send a taxicab to her Apt to pick her up and provide transportation to the AHF Clinic.   Lasix 40 mg daily KCl 20 mEq daily Diltiazem CD 240 mg daily.  Digoxin 0.125 mg daily  Apixaban 5 mg twice a day.

## 2016-07-28 NOTE — Progress Notes (Signed)
Advanced Heart Failure Rounding Note  Referring Physician: Dr. Rennis Golden Primary Physician: None Primary Cardiologist: Dr. Shirlee Latch     Reason for Consultation: Afib RVR, A/C Systolic CHF  Subjective:    Yesterday diuresed with IV lasix. Diltiazem restarted. Rate controlled. Refuses IV lasix.   Denies SOB. Wants to go home.   Objective:   Weight Range: 289 lb 6.4 oz (131.3 kg) Body mass index is 40.36 kg/m.   Vital Signs:   Temp:  [97.5 F (36.4 C)-98 F (36.7 C)] 97.5 F (36.4 C) (03/14 0521) Pulse Rate:  [73-97] 97 (03/14 0521) Resp:  [16-21] 20 (03/14 0521) BP: (118-149)/(67-96) 137/75 (03/14 0521) SpO2:  [94 %-99 %] 97 % (03/14 0521) Weight:  [289 lb 6.4 oz (131.3 kg)] 289 lb 6.4 oz (131.3 kg) (03/14 0521) Last BM Date: 07/25/16  Weight change: Filed Weights   07/25/16 1700 07/25/16 2320 07/28/16 0521  Weight: 260 lb (117.9 kg) 292 lb 6.4 oz (132.6 kg) 289 lb 6.4 oz (131.3 kg)    Intake/Output:   Intake/Output Summary (Last 24 hours) at 07/28/16 0854 Last data filed at 07/28/16 0845  Gross per 24 hour  Intake              603 ml  Output             3800 ml  Net            -3197 ml     Physical Exam: General:  Well appearing. NAD. In bed.  HEENT: Normal.  Neck: supple. JVP 5-6 cm. Carotids 2+ bilat; no bruits. No lymphadenopathy or thyromegaly appreciated. Cor: PMI nondisplaced. Irregular. Rate stable. No M/G/R appreciated.  Lungs: CTAB on room air Abdomen: Obese, soft, NT, ND, no HSM. No bruits or masses. +BS  Extremities: no cyanosis, clubbing, rash, no edema. .  Neuro: alert & orientedx3, cranial nerves grossly intact. moves all 4 extremities w/o difficulty. Affect pleasant  Telemetry: Reviewed personally, Atrial fibrillation 70-80s  Labs: CBC  Recent Labs  07/25/16 1545 07/26/16 0230 07/27/16 0324  WBC 19.6* 16.8* 16.6*  NEUTROABS 16.1*  --  14.4*  HGB 16.3* 12.1 12.6  HCT 49.8* 38.2 39.2  MCV 96.5 97.2 95.6  PLT 278 213 226   Basic  Metabolic Panel  Recent Labs  07/27/16 0324 07/28/16 0408  NA 138 138  K 4.1 3.6  CL 104 105  CO2 23 24  GLUCOSE 205* 157*  BUN 22* 22*  CREATININE 1.25* 1.10*  CALCIUM 9.2 8.5*   Liver Function Tests  Recent Labs  07/25/16 1545  AST 18  ALT 11*  ALKPHOS 57  BILITOT 0.9  PROT 7.3  ALBUMIN 3.9   No results for input(s): LIPASE, AMYLASE in the last 72 hours. Cardiac Enzymes  Recent Labs  07/25/16 1545 07/25/16 2329 07/26/16 0230  TROPONINI <0.03 <0.03 <0.03   BNP: BNP (last 3 results)  Recent Labs  11/30/15 2250 12/04/15 0520 07/25/16 1545  BNP 94.4 113.8* 209.8*   ProBNP (last 3 results) No results for input(s): PROBNP in the last 8760 hours.  D-Dimer No results for input(s): DDIMER in the last 72 hours. Hemoglobin A1C  Recent Labs  07/25/16 2329  HGBA1C 6.9*   Fasting Lipid Panel No results for input(s): CHOL, HDL, LDLCALC, TRIG, CHOLHDL, LDLDIRECT in the last 72 hours. Thyroid Function Tests  Recent Labs  07/25/16 2329  TSH 1.767   Other results:  Imaging/Studies:  No results found.  Medications:    Scheduled Medications: .  apixaban  5 mg Oral BID  . ciprofloxacin  250 mg Oral BID  . digoxin  0.125 mg Oral Daily  . diltiazem  240 mg Oral Daily  . furosemide  60 mg Intravenous BID  . furosemide  60 mg Intravenous Once  . insulin aspart  0-15 Units Subcutaneous TID WC  . insulin aspart  0-5 Units Subcutaneous QHS  . insulin glargine  14 Units Subcutaneous QHS  . metoprolol succinate  50 mg Oral BID  . sodium chloride flush  3 mL Intravenous Q12H    Infusions:   PRN Medications: acetaminophen **OR** acetaminophen, alum & mag hydroxide-simeth, diphenhydrAMINE, HYDROcodone-acetaminophen, loperamide, ondansetron **OR** ondansetron (ZOFRAN) IV   Assessment/Plan   Kaitlyn [name] is a 66 y.o. female with permanent Afib and presumed NICM. Has repeatedly refused any invasive work up, cardioversion, etc.  Has long history of  non-compliance to outpatient follow up.  Last admitted 8 months ago.  Recently ran out of her medications. HF team consulted with Afib RVR and volume overload.  1. Atrial Fibrillation; Permanent-CHA2DS2/VASc is at least 5.  - Controlled rate. Continue diltiazem cd 240 mg daily, digoxin, and toprol xl.  Continue apixaban 5 mg twice a day.  2. Acute on chronic systolic CHF: Echo 07/26/16 LVEF 30-35%. Improved from 20-25% from 09/2014. - Volume status stable. Stop IV lasix. Start lasix 40 mg daily.   - Continue digoxin 0.125 mg daily. Will need level checked as outpatient. Not sure when she will follow up.  3. UTI- On Cipro. Per primary 4. Type II DM  REFUSES HF F/U APPOINTMENT. I have reinforced the importance of follow up. HF phone number provided. Difficult to manage due to poor insight. Suspect she will not follow up. I will ask HF Navigator to call next week and try to set up follow up. Make appointment with Dr. Shirlee Latch in 1 month, help her with transportation.   D/C Meds Lasix 40 mg daily KCl 20 mEq daily Diltiazem CD 240 mg daily.  Digoxin 0.125 mg daily  Toprol XL 50 mg twice day.  Apixaban 5 mg twice a day.    Length of Stay: 3  Amy Clegg, NP  07/28/2016, 8:54 AM  Advanced Heart Failure Team Pager [phone] (M-F; 7a - 4p)  Please contact [facility] for night-coverage after hours (4p -7a ) and weekends on [email or web address]  Patient seen with NP, agree with the above note.  HR much better controlled back on home regimen.  She has been started on Eliquis and says that she will followup with [place] this time, agrees today to an appointment in 1 month.    - Will not give refills of Eliquis or digoxin as these meds will not be safe for her to take if she does not followup as an outpatient (will have to come to appt to get refills).   Echo with EF 30-35%, diffuse hypokinesis.  Good diuresis yesterday, looks euvolemic.  Start Lasix 40 mg po daily with KCl 20 daily.    She can go home today on  the above medication list.  Will work on helping her with the meds.   [name] 07/28/2016 10:57 AM
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
## 2016-07-28 NOTE — Discharge Summary (Signed)
DISCHARGE SUMMARY  Kaitlyn [name]  MR#: [id number]  DOB:11-22-1950  Date of Admission: 07/25/2016 Date of Discharge: 07/28/2016  Attending Physician:Alek Borges T  Patient's PCP:No PCP Per Patient  Consults:  CHF Team   Disposition: D/C Home   Follow-up Appts: Follow-up Information    [facility]. Call.   Why:  Call to schedule appointment to establish a PCP if desired.  Contact information: [address] [place] [address] [phone]         Follow-up issues: -check digoxin level  -assess CBG control - assure pt checking CBGs at home - consider oral meds for DM if pt will allow -recheck renal fxn  Discharge Diagnoses: Chronic Atrial fibrillation with acute RVR Acute exacerbation of Chronic systolic heart failure Hypotension, lactic acidosis Hx of angioedema w/ Lisinopril  AKI DM2 UTI Obesity - Body mass index is 40.78 kg/m.  Noncompliance w/ medications or medical care  Initial presentation: 66 y.o.femalewith history of Diabetes (diet controlled), seizure disorder (off medications for >20 year), rosacea, Afib with RVR, and systolic CHF who summoned EMS due to c/o palpitations, SOB, headache, lightheadedness and diarrhea. When EMS arrived she was noted to be in Afib with RVR.   Hospital Course:  Chronic Atrial fibrillation with acute RVR Cardiology attended to this issue - ran out of diltiazem and digoxin prescriptions -refused DCCV - as per CHF Team: Continue diltiazem cd 240 mg daily, digoxin, and toprol xl.  Continue apixaban 5 mg twice a day.   Acute exacerbation of Chronic systolic heart failure TTE 2016 showed EF 20-25% - diuresis/care per CHF Team as follows: Echo 07/26/16 LVEF 30-35%. Improved from 20-25% from 09/2014. - Volume status stable. Stop IV lasix. Start lasix 40 mg daily. - Continue digoxin 0.125 mg daily. Will need level checked as outpatient. Not sure when she will follow  up.   Hypotension, lactic acidosis Resolved with volume resuscitation and control of RVR  Hx of angioedema w/ Lisinopril   AKI Most likely prerenal azotemia - essentially resolved at time of d/c - crt 0.94 July 2017  Recent Labs Lab 07/25/16 1545 07/26/16 0230 07/27/16 0324 07/28/16 0408  CREATININE 1.27* 1.26* 1.25* 1.10*   DM2 CBG poorly controlled initially due to steroids dosed at admit - A1c 6.9 - does not use any meds at home - CBG well controlled at time of d/c - discussed DM control and consequences on uncontrolled DM w/ pt - she is NOT interested in DM meds at present, to include oral meds or insulin - she has been advised to follow a very strict DM diet and to monitor her CBGs - the need for f/u w/ a PCP has been stressed to her, along w/ the fact that she will likely need at least oral meds for strict DM control  UTI Urine cx w/o growth - has completed 3 days+ of abx tx - no further tx indicated at time of d/c   Obesity - Body mass index is 40.78 kg/m.   Noncompliance w/ medications or medical care Well known to CHF Team - refuses to visit PCP/Outpt Cards clinic   Allergies as of 07/28/2016      Reactions   Cranberry Shortness Of Breath, Diarrhea, Itching, Other (See Comments)   Severe headache   Lisinopril Diarrhea, Itching, Swelling   Caffeine Other (See Comments)   Seizure from large doses, heart races from small doses   Penicillins Other (See Comments)   Unknown childhood allergy Has patient had a  PCN reaction causing immediate rash, facial/tongue/throat swelling, SOB or lightheadedness with hypotension: unknown Has patient had a PCN reaction causing severe rash involving mucus membranes or skin necrosis: unknown Has patient had a PCN reaction that required hospitalization unknown Has patient had a PCN reaction occurring within the last 10 years: unknown If all of the above answers are "NO", then may proceed with Cephalosporin use.      Medication  List    STOP taking these medications   aspirin 325 MG tablet   famotidine 20 MG tablet Commonly known as:  PEPCID     TAKE these medications   apixaban 5 MG Tabs tablet Commonly known as:  ELIQUIS Take 1 tablet (5 mg total) by mouth 2 (two) times daily.   digoxin 0.125 MG tablet Commonly known as:  LANOXIN Take 1 tablet (0.125 mg total) by mouth daily.   diltiazem 240 MG 24 hr capsule Commonly known as:  CARDIZEM CD Take 1 capsule (240 mg total) by mouth daily.   diphenhydrAMINE 25 mg capsule Commonly known as:  BENADRYL Take 25 mg by mouth daily as needed for itching.   EPINEPHrine 0.3 mg/0.3 mL Soaj injection Commonly known as:  EPIPEN 2-PAK Inject 0.3 mLs (0.3 mg total) into the muscle once.   furosemide 40 MG tablet Commonly known as:  LASIX Take 1 tablet (40 mg total) by mouth daily.   loperamide 2 MG tablet Commonly known as:  IMODIUM A-D Take 2 mg by mouth daily as needed for diarrhea or loose stools.   metoprolol succinate 50 MG 24 hr tablet Commonly known as:  TOPROL-XL Take 1 tablet (50 mg total) by mouth 2 (two) times daily. Take with or immediately following a meal. What changed:  See the new instructions.   Potassium Chloride ER 20 MEQ Tbcr Take 20 mEq by mouth daily.       Day of Discharge BP 127/90   Pulse 80   Temp 97.5 F (36.4 C) (Oral)   Resp 20   Ht 5\' 11"  (1.803 m)   Wt 131.3 kg (289 lb 6.4 oz)   SpO2 97%   BMI 40.36 kg/m   Physical Exam: General: No acute respiratory distress Lungs: Clear to auscultation bilaterally without wheezes or crackles Cardiovascular: Regular rate and rhythm without murmur gallop or rub normal S1 and S2 Abdomen: Nontender, nondistended, soft, bowel sounds positive, no rebound, no ascites, no appreciable mass Extremities: No significant cyanosis, clubbing, or edema bilateral lower extremities  Basic Metabolic Panel:  Recent Labs Lab 07/25/16 1545 07/26/16 0230 07/27/16 0324 07/28/16 0408  NA 139  139 138 138  K 4.1 4.6 4.1 3.6  CL 109 109 104 105  CO2 16* 20* 23 24  GLUCOSE 227* 226* 205* 157*  BUN 14 16 22* 22*  CREATININE 1.27* 1.26* 1.25* 1.10*  CALCIUM 8.9 8.2* 9.2 8.5*    Liver Function Tests:  Recent Labs Lab 07/25/16 1545  AST 18  ALT 11*  ALKPHOS 57  BILITOT 0.9  PROT 7.3  ALBUMIN 3.9    CBC:  Recent Labs Lab 07/25/16 1545 07/26/16 0230 07/27/16 0324  WBC 19.6* 16.8* 16.6*  NEUTROABS 16.1*  --  14.4*  HGB 16.3* 12.1 12.6  HCT 49.8* 38.2 39.2  MCV 96.5 97.2 95.6  PLT 278 213 226    Cardiac Enzymes:  Recent Labs Lab 07/25/16 1545 07/25/16 2329 07/26/16 0230  TROPONINI <0.03 <0.03 <0.03   BNP (last 3 results)  Recent Labs  11/30/15 2250 12/04/15 0520 07/25/16  1545  BNP 94.4 113.8* 209.8*    CBG:  Recent Labs Lab 07/27/16 1153 07/27/16 1720 07/27/16 2233 07/28/16 0747 07/28/16 1133  GLUCAP 149* 145* 117* 90 126*    Recent Results (from the past 240 hour(s))  Culture, Urine     Status: None   Collection Time: 07/25/16 11:00 AM  Result Value Ref Range Status   Specimen Description URINE, RANDOM  Final   Special Requests NONE  Final   Culture NO GROWTH  Final   Report Status 07/27/2016 FINAL  Final  Blood culture (routine x 2)     Status: None (Preliminary result)   Collection Time: 07/25/16  5:31 PM  Result Value Ref Range Status   Specimen Description BLOOD LEFT HAND  Final   Special Requests AEROBIC BOTTLE ONLY 10CC  Final   Culture NO GROWTH 3 DAYS  Final   Report Status PENDING  Incomplete  Blood culture (routine x 2)     Status: None (Preliminary result)   Collection Time: 07/25/16  5:33 PM  Result Value Ref Range Status   Specimen Description BLOOD LEFT FOREARM  Final   Special Requests AEROBIC BOTTLE ONLY 10CC  Final   Culture NO GROWTH 3 DAYS  Final   Report Status PENDING  Incomplete  MRSA PCR Screening     Status: None   Collection Time: 07/26/16  2:51 AM  Result Value Ref Range Status   MRSA by PCR  NEGATIVE NEGATIVE Final    Comment:        The GeneXpert MRSA Assay (FDA approved for NASAL specimens only), is one component of a comprehensive MRSA colonization surveillance program. It is not intended to diagnose MRSA infection nor to guide or monitor treatment for MRSA infections.       Time spent in discharge (includes decision making & examination of pt): <30 minutes  07/28/2016, 12:06 PM   Lonia Blood, MD [facility] Office  [phone] Pager [phone]  On-Call/Text Page:      [email or web address]      password [facility]

## 2016-07-28 NOTE — Plan of Care (Signed)
Problem: Safety: Goal: Ability to remain free from injury will improve Outcome: Progressing Patient uses a walker at home, needs assist in room.

## 2016-07-30 LAB — CULTURE, BLOOD (ROUTINE X 2)
Culture: NO GROWTH
Culture: NO GROWTH

## 2016-08-30 ENCOUNTER — Inpatient Hospital Stay (HOSPITAL_COMMUNITY): Admit: 2016-08-30 | Payer: Self-pay

## 2016-10-04 ENCOUNTER — Emergency Department (HOSPITAL_COMMUNITY): Payer: Medicare Other

## 2016-10-04 ENCOUNTER — Encounter (HOSPITAL_COMMUNITY): Payer: Self-pay

## 2016-10-04 ENCOUNTER — Emergency Department (HOSPITAL_COMMUNITY)
Admission: EM | Admit: 2016-10-04 | Discharge: 2016-10-04 | Disposition: A | Discharge status: Home / Self Care | Payer: Medicare Other | Attending: Emergency Medicine | Admitting: Emergency Medicine

## 2016-10-04 DIAGNOSIS — Z79899 Other long term (current) drug therapy: Secondary | ICD-10-CM | POA: Diagnosis not present

## 2016-10-04 DIAGNOSIS — E860 Dehydration: Secondary | ICD-10-CM | POA: Diagnosis not present

## 2016-10-04 DIAGNOSIS — Z91199 Patient's noncompliance with other medical treatment and regimen due to unspecified reason: Secondary | ICD-10-CM

## 2016-10-04 DIAGNOSIS — I5022 Chronic systolic (congestive) heart failure: Secondary | ICD-10-CM | POA: Diagnosis present

## 2016-10-04 DIAGNOSIS — Z794 Long term (current) use of insulin: Secondary | ICD-10-CM

## 2016-10-04 DIAGNOSIS — E876 Hypokalemia: Secondary | ICD-10-CM

## 2016-10-04 DIAGNOSIS — I4891 Unspecified atrial fibrillation: Secondary | ICD-10-CM | POA: Diagnosis present

## 2016-10-04 DIAGNOSIS — D72829 Elevated white blood cell count, unspecified: Secondary | ICD-10-CM | POA: Diagnosis not present

## 2016-10-04 DIAGNOSIS — I10 Essential (primary) hypertension: Secondary | ICD-10-CM | POA: Diagnosis present

## 2016-10-04 DIAGNOSIS — I110 Hypertensive heart disease with heart failure: Secondary | ICD-10-CM | POA: Insufficient documentation

## 2016-10-04 DIAGNOSIS — Z9114 Patient's other noncompliance with medication regimen: Secondary | ICD-10-CM | POA: Diagnosis not present

## 2016-10-04 DIAGNOSIS — E1165 Type 2 diabetes mellitus with hyperglycemia: Secondary | ICD-10-CM | POA: Diagnosis not present

## 2016-10-04 DIAGNOSIS — Z9119 Patient's noncompliance with other medical treatment and regimen: Secondary | ICD-10-CM | POA: Diagnosis not present

## 2016-10-04 DIAGNOSIS — R197 Diarrhea, unspecified: Secondary | ICD-10-CM | POA: Diagnosis present

## 2016-10-04 DIAGNOSIS — I509 Heart failure, unspecified: Secondary | ICD-10-CM

## 2016-10-04 DIAGNOSIS — E119 Type 2 diabetes mellitus without complications: Secondary | ICD-10-CM | POA: Insufficient documentation

## 2016-10-04 DIAGNOSIS — R569 Unspecified convulsions: Secondary | ICD-10-CM

## 2016-10-04 LAB — PROTIME-INR
INR: 1.07
Prothrombin Time: 14 seconds (ref 11.4–15.2)

## 2016-10-04 LAB — HEPATIC FUNCTION PANEL
ALT: 16 U/L (ref 14–54)
AST: 24 U/L (ref 15–41)
Albumin: 3.5 g/dL (ref 3.5–5.0)
Alkaline Phosphatase: 60 U/L (ref 38–126)
Bilirubin, Direct: 0.2 mg/dL (ref 0.1–0.5)
Indirect Bilirubin: 0.8 mg/dL (ref 0.3–0.9)
Total Bilirubin: 1 mg/dL (ref 0.3–1.2)
Total Protein: 6.5 g/dL (ref 6.5–8.1)

## 2016-10-04 LAB — DIGOXIN LEVEL: Digoxin Level: <0.2 ng/mL — ABNORMAL LOW (ref 0.8–2.0)

## 2016-10-04 LAB — BASIC METABOLIC PANEL
Anion gap: 10 (ref 5–15)
BUN: 13 mg/dL (ref 6–20)
CO2: 17 mmol/L — ABNORMAL LOW (ref 22–32)
Calcium: 8.2 mg/dL — ABNORMAL LOW (ref 8.9–10.3)
Chloride: 110 mmol/L (ref 101–111)
Creatinine, Ser: 1.31 mg/dL — ABNORMAL HIGH (ref 0.44–1.00)
GFR calc Af Amer: 48 mL/min — ABNORMAL LOW (ref >60)
GFR calc non Af Amer: 41 mL/min — ABNORMAL LOW (ref >60)
Glucose, Bld: 294 mg/dL — ABNORMAL HIGH (ref 65–99)
Potassium: 3.2 mmol/L — ABNORMAL LOW (ref 3.5–5.1)
Sodium: 137 mmol/L (ref 135–145)

## 2016-10-04 LAB — CBC
HCT: 46.5 % — ABNORMAL HIGH (ref 36.0–46.0)
Hemoglobin: 15.4 g/dL — ABNORMAL HIGH (ref 12.0–15.0)
MCH: 34 pg (ref 26.0–34.0)
MCHC: 33.1 g/dL (ref 30.0–36.0)
MCV: 102.6 fL — ABNORMAL HIGH (ref 78.0–100.0)
Platelets: 258 10*3/uL (ref 150–400)
RBC: 4.53 MIL/uL (ref 3.87–5.11)
RDW: 17.6 % — ABNORMAL HIGH (ref 11.5–15.5)
WBC: 21.4 10*3/uL — ABNORMAL HIGH (ref 4.0–10.5)

## 2016-10-04 LAB — I-STAT TROPONIN, ED: Troponin i, poc: 0.01 ng/mL (ref 0.00–0.08)

## 2016-10-04 MED ORDER — DILTIAZEM HCL 100 MG IV SOLR
5.0000 mg/h | Freq: Once | INTRAVENOUS | Status: AC
  Administered 2016-10-04: 5 mg/h via INTRAVENOUS
  Filled 2016-10-04: qty 100

## 2016-10-04 MED ORDER — SODIUM CHLORIDE 0.9 % IV BOLUS (SEPSIS)
500.0000 mL | Freq: Once | INTRAVENOUS | Status: AC
  Administered 2016-10-04: 500 mL via INTRAVENOUS

## 2016-10-04 MED ORDER — POTASSIUM CHLORIDE CRYS ER 20 MEQ PO TBCR
40.0000 meq | EXTENDED_RELEASE_TABLET | Freq: Once | ORAL | Status: AC
  Administered 2016-10-04: 40 meq via ORAL
  Filled 2016-10-04: qty 2

## 2016-10-04 MED ORDER — DILTIAZEM HCL 25 MG/5ML IV SOLN
20.0000 mg | Freq: Once | INTRAVENOUS | Status: AC
  Administered 2016-10-04: 20 mg via INTRAVENOUS
  Filled 2016-10-04: qty 5

## 2016-10-04 MED ORDER — DIGOXIN 125 MCG PO TABS
0.1250 mg | ORAL_TABLET | Freq: Every day | ORAL | Status: DC
  Administered 2016-10-04: 0.125 mg via ORAL
  Filled 2016-10-04: qty 1

## 2016-10-04 MED ORDER — METOPROLOL TARTRATE 5 MG/5ML IV SOLN
2.5000 mg | Freq: Once | INTRAVENOUS | Status: AC
  Administered 2016-10-04: 2.5 mg via INTRAVENOUS
  Filled 2016-10-04: qty 5

## 2016-10-04 MED ORDER — DILTIAZEM HCL ER COATED BEADS 240 MG PO CP24
240.0000 mg | ORAL_CAPSULE | Freq: Once | ORAL | Status: AC
  Administered 2016-10-04: 240 mg via ORAL
  Filled 2016-10-04: qty 1

## 2016-10-04 MED ORDER — LOPERAMIDE HCL 1 MG/5ML PO LIQD
4.0000 mg | Freq: Once | ORAL | Status: AC
  Administered 2016-10-04: 4 mg via ORAL
  Filled 2016-10-04: qty 20
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
## 2016-10-04 NOTE — ED Notes (Signed)
Cardiology at bedside.

## 2016-10-04 NOTE — ED Notes (Signed)
Pt. Upset with RN for not calling [facility] for her. Pt. Also very upset that RN will not get her a cab voucher. CSW made aware and will come to talk to her.

## 2016-10-04 NOTE — ED Notes (Signed)
EDP given EKG and made aware of HR in 170's.

## 2016-10-04 NOTE — ED Notes (Signed)
Pt. Yelling for nurse and states she can't breathe. Pt. Reassured her oxygen saturation was great. Pt. Placed on 2L via [place] for comfort at this time.
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
## 2016-10-04 NOTE — ED Provider Notes (Signed)
3:59 PM Care assumed from Dr. Anitra Lauth.   At time of transfer of care, patient is awaiting reassessment following discontinuation of her Cardizem drip. Patient was seen by cardiology and internal medicine who felt patient was stable for discharge if her heart rate improved on her home medications and off of the drip. Patient will even fluids and will be reassessed. Anticipate discharge if heart rate improved.  Patient was reassessed after a period of observation post drip with continued resolution of her tachycardia. Patient instructed to continue taking her home medications and then following up with cardiology as previously directed. Patient had no other symptoms in the emergency department and agreed with the plan of care.  Patient had no other concerns and was discharged in good condition.    Clinical Impression: 1. Atrial fibrillation with RVR (HCC)   2. Diarrhea, unspecified type   3. Dehydration     Disposition: Discharge  Condition: Good  I have discussed the results, Dx and Tx plan with the pt(& family if present). He/she/they expressed understanding and agree(s) with the plan. Discharge instructions discussed at great length. Strict return precautions discussed and pt &/or family have verbalized understanding of the instructions. No further questions at time of discharge.    Discharge Medication List as of 10/04/2016  3:45 PM      Follow Up: Laurey Morale, MD [address] [place] [address] [phone]   on the 29th as planned     Saryna Kneeland, Canary Brim, MD 10/05/16 [phone]

## 2016-10-04 NOTE — ED Triage Notes (Signed)
Pt. Coming from home via [facility] for afib RVR after an episode of diarrhea. EMS noted pt. heartrate 220's upon their arrival. Pt. Hx of persistent Afib. Pt. Also hx of non-compliance with medications. EMS gave a dose of 6 then a dose of 12 adenosine, which brought pt. Heart rate into 170's. Pt. Diaphoretic and short of breath. Pt. C/o 5/10 chest pressure. Pt. Given 500 mL normal saline en route. Pt. Aox4.

## 2016-10-04 NOTE — ED Notes (Signed)
Social work at bedside.  
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
## 2016-10-12 ENCOUNTER — Telehealth: Payer: Self-pay | Admitting: Licensed Clinical Social Worker

## 2016-10-12 ENCOUNTER — Encounter (HOSPITAL_COMMUNITY): Payer: Self-pay

## 2016-10-12 NOTE — Telephone Encounter (Signed)
CSW informed that patient had cancelled today's visit and rescheduled for next month. CSW contacted patient to offer assistance and transportation as well. CSW left message as patient did not answer. Lasandra Beech, LCSW, CCSW-[facility] [phone]

## 2016-11-01 ENCOUNTER — Encounter (HOSPITAL_COMMUNITY): Payer: Self-pay

## 2016-11-19 ENCOUNTER — Telehealth (HOSPITAL_COMMUNITY): Payer: Self-pay | Admitting: Vascular Surgery

## 2016-11-19 NOTE — Telephone Encounter (Signed)
Pt left message on answering service to cancel appt 7/10 @ 2, called pt to reschedule no answer and pt does not have VM

## 2016-11-22 ENCOUNTER — Telehealth: Payer: Self-pay | Admitting: Licensed Clinical Social Worker

## 2016-11-22 NOTE — Telephone Encounter (Signed)
CSW received referral to follow up with patient regarding multiple cancellations. CSW reached patient who states "I still have pills and will call when I get close to running out". CSW encouraged patient to make appointment and not wait until pills run out before scheduling follow up visit. Patient promises to call for an appointment by the end of the month. CSW assured patient we would provide cab ride to ensure clinic appointment made. Lasandra Beech, LCSW, CCSW-[facility] [phone]

## 2016-11-23 ENCOUNTER — Encounter (HOSPITAL_COMMUNITY): Payer: Self-pay

## 2017-01-07 ENCOUNTER — Inpatient Hospital Stay (HOSPITAL_COMMUNITY)
Admission: EM | Admit: 2017-01-07 | Discharge: 2017-01-09 | DRG: 281 | Disposition: A | Discharge status: Home / Self Care | Payer: Medicare Other | Attending: Internal Medicine | Admitting: Internal Medicine

## 2017-01-07 ENCOUNTER — Encounter (HOSPITAL_COMMUNITY): Payer: Self-pay | Admitting: *Deleted

## 2017-01-07 ENCOUNTER — Emergency Department (HOSPITAL_COMMUNITY): Payer: Medicare Other

## 2017-01-07 DIAGNOSIS — Z6841 Body Mass Index (BMI) 40.0 and over, adult: Secondary | ICD-10-CM

## 2017-01-07 DIAGNOSIS — E876 Hypokalemia: Secondary | ICD-10-CM | POA: Diagnosis not present

## 2017-01-07 DIAGNOSIS — I214 Non-ST elevation (NSTEMI) myocardial infarction: Secondary | ICD-10-CM

## 2017-01-07 DIAGNOSIS — Z79899 Other long term (current) drug therapy: Secondary | ICD-10-CM

## 2017-01-07 DIAGNOSIS — I480 Paroxysmal atrial fibrillation: Principal | ICD-10-CM | POA: Diagnosis present

## 2017-01-07 DIAGNOSIS — E119 Type 2 diabetes mellitus without complications: Secondary | ICD-10-CM | POA: Diagnosis present

## 2017-01-07 DIAGNOSIS — Z91199 Patient's noncompliance with other medical treatment and regimen due to unspecified reason: Secondary | ICD-10-CM

## 2017-01-07 DIAGNOSIS — I21A1 Myocardial infarction type 2: Secondary | ICD-10-CM | POA: Diagnosis present

## 2017-01-07 DIAGNOSIS — R079 Chest pain, unspecified: Secondary | ICD-10-CM | POA: Diagnosis present

## 2017-01-07 DIAGNOSIS — I4891 Unspecified atrial fibrillation: Secondary | ICD-10-CM | POA: Diagnosis not present

## 2017-01-07 DIAGNOSIS — X58XXXA Exposure to other specified factors, initial encounter: Secondary | ICD-10-CM | POA: Diagnosis present

## 2017-01-07 DIAGNOSIS — Z9119 Patient's noncompliance with other medical treatment and regimen: Secondary | ICD-10-CM

## 2017-01-07 DIAGNOSIS — I5022 Chronic systolic (congestive) heart failure: Secondary | ICD-10-CM | POA: Diagnosis present

## 2017-01-07 DIAGNOSIS — I420 Dilated cardiomyopathy: Secondary | ICD-10-CM

## 2017-01-07 DIAGNOSIS — Z7984 Long term (current) use of oral hypoglycemic drugs: Secondary | ICD-10-CM

## 2017-01-07 DIAGNOSIS — I482 Chronic atrial fibrillation: Secondary | ICD-10-CM | POA: Diagnosis present

## 2017-01-07 DIAGNOSIS — Z7901 Long term (current) use of anticoagulants: Secondary | ICD-10-CM

## 2017-01-07 DIAGNOSIS — R197 Diarrhea, unspecified: Secondary | ICD-10-CM | POA: Diagnosis present

## 2017-01-07 DIAGNOSIS — I429 Cardiomyopathy, unspecified: Secondary | ICD-10-CM

## 2017-01-07 DIAGNOSIS — I110 Hypertensive heart disease with heart failure: Secondary | ICD-10-CM | POA: Diagnosis present

## 2017-01-07 DIAGNOSIS — Z91128 Patient's intentional underdosing of medication regimen for other reason: Secondary | ICD-10-CM

## 2017-01-07 DIAGNOSIS — E1165 Type 2 diabetes mellitus with hyperglycemia: Secondary | ICD-10-CM

## 2017-01-07 DIAGNOSIS — I255 Ischemic cardiomyopathy: Secondary | ICD-10-CM | POA: Diagnosis present

## 2017-01-07 DIAGNOSIS — Z7982 Long term (current) use of aspirin: Secondary | ICD-10-CM

## 2017-01-07 DIAGNOSIS — D72829 Elevated white blood cell count, unspecified: Secondary | ICD-10-CM | POA: Diagnosis present

## 2017-01-07 DIAGNOSIS — T45516A Underdosing of anticoagulants, initial encounter: Secondary | ICD-10-CM | POA: Diagnosis present

## 2017-01-07 LAB — CBC
HCT: 44.7 % (ref 36.0–46.0)
Hemoglobin: 14.7 g/dL (ref 12.0–15.0)
MCH: 35.6 pg — ABNORMAL HIGH (ref 26.0–34.0)
MCHC: 32.9 g/dL (ref 30.0–36.0)
MCV: 108.2 fL — ABNORMAL HIGH (ref 78.0–100.0)
Platelets: 273 10*3/uL (ref 150–400)
RBC: 4.13 MIL/uL (ref 3.87–5.11)
RDW: 16.1 % — ABNORMAL HIGH (ref 11.5–15.5)
WBC: 18.1 10*3/uL — ABNORMAL HIGH (ref 4.0–10.5)

## 2017-01-07 LAB — BASIC METABOLIC PANEL
Anion gap: 11 (ref 5–15)
BUN: 10 mg/dL (ref 6–20)
CO2: 21 mmol/L — ABNORMAL LOW (ref 22–32)
Calcium: 8.4 mg/dL — ABNORMAL LOW (ref 8.9–10.3)
Chloride: 107 mmol/L (ref 101–111)
Creatinine, Ser: 1.13 mg/dL — ABNORMAL HIGH (ref 0.44–1.00)
GFR calc Af Amer: 57 mL/min — ABNORMAL LOW (ref >60)
GFR calc non Af Amer: 50 mL/min — ABNORMAL LOW (ref >60)
Glucose, Bld: 252 mg/dL — ABNORMAL HIGH (ref 65–99)
Potassium: 3.6 mmol/L (ref 3.5–5.1)
Sodium: 139 mmol/L (ref 135–145)

## 2017-01-07 LAB — DIFFERENTIAL
Basophils Absolute: 0.1 10*3/uL (ref 0.0–0.1)
Basophils Relative: 0 %
Eosinophils Absolute: 0.3 10*3/uL (ref 0.0–0.7)
Eosinophils Relative: 2 %
Lymphocytes Relative: 10 %
Lymphs Abs: 1.8 10*3/uL (ref 0.7–4.0)
Monocytes Absolute: 0.8 10*3/uL (ref 0.1–1.0)
Monocytes Relative: 4 %
Neutro Abs: 15.1 10*3/uL — ABNORMAL HIGH (ref 1.7–7.7)
Neutrophils Relative %: 84 %

## 2017-01-07 LAB — TROPONIN I: Troponin I: <0.03 ng/mL (ref <0.03)

## 2017-01-07 LAB — PROTIME-INR
INR: 1.06
Prothrombin Time: 13.9 seconds (ref 11.4–15.2)

## 2017-01-07 LAB — DIGOXIN LEVEL: Digoxin Level: 0.3 ng/mL — ABNORMAL LOW (ref 0.8–2.0)

## 2017-01-07 LAB — HEMOGLOBIN A1C
Hgb A1c MFr Bld: 7.6 % — ABNORMAL HIGH (ref 4.8–5.6)
Mean Plasma Glucose: 171.42 mg/dL

## 2017-01-07 LAB — MAGNESIUM: Magnesium: 1.9 mg/dL (ref 1.7–2.4)

## 2017-01-07 LAB — BRAIN NATRIURETIC PEPTIDE: B Natriuretic Peptide: 205.5 pg/mL — ABNORMAL HIGH (ref 0.0–100.0)

## 2017-01-07 LAB — APTT: aPTT: 36 seconds (ref 24–36)

## 2017-01-07 MED ORDER — ONDANSETRON HCL 4 MG PO TABS: 4.0000 mg | ORAL_TABLET | Freq: Four times a day (QID) | ORAL | Status: DC | PRN

## 2017-01-07 MED ORDER — DILTIAZEM LOAD VIA INFUSION
20.0000 mg | Freq: Once | INTRAVENOUS | Status: DC
  Filled 2017-01-07: qty 20

## 2017-01-07 MED ORDER — ONDANSETRON HCL 4 MG/2ML IJ SOLN: 4.0000 mg | Freq: Four times a day (QID) | INTRAMUSCULAR | Status: DC | PRN

## 2017-01-07 MED ORDER — APIXABAN 5 MG PO TABS
5.0000 mg | ORAL_TABLET | Freq: Two times a day (BID) | ORAL | Status: DC
  Administered 2017-01-07: 5 mg via ORAL
  Filled 2017-01-07: qty 1

## 2017-01-07 MED ORDER — DIGOXIN 125 MCG PO TABS
0.1250 mg | ORAL_TABLET | Freq: Every day | ORAL | Status: DC
  Administered 2017-01-08 – 2017-01-09 (×2): 0.125 mg via ORAL
  Filled 2017-01-07 (×2): qty 1

## 2017-01-07 MED ORDER — DILTIAZEM HCL 30 MG PO TABS
30.0000 mg | ORAL_TABLET | Freq: Once | ORAL | Status: DC
  Filled 2017-01-07: qty 1

## 2017-01-07 MED ORDER — LACTATED RINGERS IV BOLUS (SEPSIS)
1000.0000 mL | Freq: Once | INTRAVENOUS | Status: AC
  Administered 2017-01-07: 1000 mL via INTRAVENOUS

## 2017-01-07 MED ORDER — DILTIAZEM HCL 100 MG IV SOLR
5.0000 mg/h | INTRAVENOUS | Status: DC
  Administered 2017-01-07: 5 mg/h via INTRAVENOUS
  Filled 2017-01-07: qty 100

## 2017-01-07 MED ORDER — INSULIN ASPART 100 UNIT/ML ~~LOC~~ SOLN
0.0000 [IU] | Freq: Three times a day (TID) | SUBCUTANEOUS | Status: DC
Start: 2017-01-08 — End: 2017-01-09
  Administered 2017-01-08 (×2): 2 [IU] via SUBCUTANEOUS
  Administered 2017-01-08: 3 [IU] via SUBCUTANEOUS
  Administered 2017-01-09 (×2): 2 [IU] via SUBCUTANEOUS

## 2017-01-07 MED ORDER — METOPROLOL SUCCINATE ER 50 MG PO TB24
50.0000 mg | ORAL_TABLET | Freq: Two times a day (BID) | ORAL | Status: DC
  Administered 2017-01-07 – 2017-01-09 (×4): 50 mg via ORAL
  Filled 2017-01-07 (×4): qty 1

## 2017-01-07 MED ORDER — ACETAMINOPHEN 325 MG PO TABS
650.0000 mg | ORAL_TABLET | Freq: Four times a day (QID) | ORAL | Status: DC | PRN
  Administered 2017-01-08: 650 mg via ORAL
  Filled 2017-01-07: qty 2

## 2017-01-07 MED ORDER — ACETAMINOPHEN 650 MG RE SUPP: 650.0000 mg | Freq: Four times a day (QID) | RECTAL | Status: DC | PRN

## 2017-01-07 MED ORDER — DILTIAZEM HCL ER COATED BEADS 240 MG PO CP24
240.0000 mg | ORAL_CAPSULE | Freq: Every day | ORAL | Status: DC
  Administered 2017-01-08 – 2017-01-09 (×2): 240 mg via ORAL
  Filled 2017-01-07 (×2): qty 1

## 2017-01-07 MED ORDER — DILTIAZEM HCL 25 MG/5ML IV SOLN
20.0000 mg | Freq: Once | INTRAVENOUS | Status: AC
  Administered 2017-01-07: 20 mg via INTRAVENOUS

## 2017-01-07 NOTE — ED Triage Notes (Signed)
EMS was called out for diarrhea.  Pt was found to be in afib with RVR at a rate up to the mid 190's.  Pt has not been taking her cardizem.  Per EMS pt has self care deficit.  Pt is alert and oriented, reports chest pressure 7/10.
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
## 2017-01-07 NOTE — ED Notes (Signed)
Spoke with Homero Fellers in pharmacy, he stated that they are verifying the cardizem and are sending it up "STAT"

## 2017-01-07 NOTE — ED Notes (Signed)
Report attempted 

## 2017-01-08 DIAGNOSIS — T45516A Underdosing of anticoagulants, initial encounter: Secondary | ICD-10-CM | POA: Diagnosis present

## 2017-01-08 DIAGNOSIS — R079 Chest pain, unspecified: Secondary | ICD-10-CM | POA: Diagnosis present

## 2017-01-08 DIAGNOSIS — Z9119 Patient's noncompliance with other medical treatment and regimen: Secondary | ICD-10-CM | POA: Diagnosis not present

## 2017-01-08 DIAGNOSIS — D72829 Elevated white blood cell count, unspecified: Secondary | ICD-10-CM | POA: Diagnosis present

## 2017-01-08 DIAGNOSIS — E119 Type 2 diabetes mellitus without complications: Secondary | ICD-10-CM | POA: Diagnosis present

## 2017-01-08 DIAGNOSIS — Z9114 Patient's other noncompliance with medication regimen: Secondary | ICD-10-CM | POA: Diagnosis not present

## 2017-01-08 DIAGNOSIS — Z7901 Long term (current) use of anticoagulants: Secondary | ICD-10-CM | POA: Diagnosis not present

## 2017-01-08 DIAGNOSIS — I255 Ischemic cardiomyopathy: Secondary | ICD-10-CM

## 2017-01-08 DIAGNOSIS — I420 Dilated cardiomyopathy: Secondary | ICD-10-CM | POA: Diagnosis not present

## 2017-01-08 DIAGNOSIS — I214 Non-ST elevation (NSTEMI) myocardial infarction: Secondary | ICD-10-CM | POA: Diagnosis not present

## 2017-01-08 DIAGNOSIS — Z79899 Other long term (current) drug therapy: Secondary | ICD-10-CM | POA: Diagnosis not present

## 2017-01-08 DIAGNOSIS — I5022 Chronic systolic (congestive) heart failure: Secondary | ICD-10-CM | POA: Diagnosis present

## 2017-01-08 DIAGNOSIS — Z7982 Long term (current) use of aspirin: Secondary | ICD-10-CM | POA: Diagnosis not present

## 2017-01-08 DIAGNOSIS — Z7984 Long term (current) use of oral hypoglycemic drugs: Secondary | ICD-10-CM | POA: Diagnosis not present

## 2017-01-08 DIAGNOSIS — I4891 Unspecified atrial fibrillation: Secondary | ICD-10-CM | POA: Diagnosis present

## 2017-01-08 DIAGNOSIS — I482 Chronic atrial fibrillation: Secondary | ICD-10-CM | POA: Diagnosis present

## 2017-01-08 DIAGNOSIS — I110 Hypertensive heart disease with heart failure: Secondary | ICD-10-CM | POA: Diagnosis present

## 2017-01-08 DIAGNOSIS — I21A1 Myocardial infarction type 2: Secondary | ICD-10-CM | POA: Diagnosis present

## 2017-01-08 DIAGNOSIS — Z91128 Patient's intentional underdosing of medication regimen for other reason: Secondary | ICD-10-CM | POA: Diagnosis not present

## 2017-01-08 DIAGNOSIS — X58XXXA Exposure to other specified factors, initial encounter: Secondary | ICD-10-CM | POA: Diagnosis present

## 2017-01-08 DIAGNOSIS — E876 Hypokalemia: Secondary | ICD-10-CM | POA: Diagnosis not present

## 2017-01-08 DIAGNOSIS — R197 Diarrhea, unspecified: Secondary | ICD-10-CM | POA: Diagnosis present

## 2017-01-08 DIAGNOSIS — I480 Paroxysmal atrial fibrillation: Secondary | ICD-10-CM | POA: Diagnosis present

## 2017-01-08 DIAGNOSIS — Z6841 Body Mass Index (BMI) 40.0 and over, adult: Secondary | ICD-10-CM | POA: Diagnosis not present

## 2017-01-08 LAB — BASIC METABOLIC PANEL
Anion gap: 9 (ref 5–15)
BUN: 13 mg/dL (ref 6–20)
CO2: 23 mmol/L (ref 22–32)
Calcium: 8.3 mg/dL — ABNORMAL LOW (ref 8.9–10.3)
Chloride: 106 mmol/L (ref 101–111)
Creatinine, Ser: 1.08 mg/dL — ABNORMAL HIGH (ref 0.44–1.00)
GFR calc Af Amer: >60 mL/min (ref >60)
GFR calc non Af Amer: 52 mL/min — ABNORMAL LOW (ref >60)
Glucose, Bld: 181 mg/dL — ABNORMAL HIGH (ref 65–99)
Potassium: 3.4 mmol/L — ABNORMAL LOW (ref 3.5–5.1)
Sodium: 138 mmol/L (ref 135–145)

## 2017-01-08 LAB — TROPONIN I
Troponin I: 0.99 ng/mL (ref <0.03)
Troponin I: 2.02 ng/mL (ref <0.03)
Troponin I: 1.5 ng/mL (ref <0.03)
Troponin I: 1.19 ng/mL (ref <0.03)
Troponin I: 0.93 ng/mL (ref <0.03)

## 2017-01-08 LAB — LIPID PANEL
Cholesterol: 138 mg/dL (ref 0–200)
HDL: 32 mg/dL — ABNORMAL LOW (ref >40)
LDL Cholesterol: 71 mg/dL (ref 0–99)
Total CHOL/HDL Ratio: 4.3 RATIO
Triglycerides: 174 mg/dL — ABNORMAL HIGH (ref <150)
VLDL: 35 mg/dL (ref 0–40)

## 2017-01-08 LAB — CBC
HCT: 37.5 % (ref 36.0–46.0)
Hemoglobin: 12 g/dL (ref 12.0–15.0)
MCH: 35.2 pg — ABNORMAL HIGH (ref 26.0–34.0)
MCHC: 32 g/dL (ref 30.0–36.0)
MCV: 110 fL — ABNORMAL HIGH (ref 78.0–100.0)
Platelets: 210 10*3/uL (ref 150–400)
RBC: 3.41 MIL/uL — ABNORMAL LOW (ref 3.87–5.11)
RDW: 17.2 % — ABNORMAL HIGH (ref 11.5–15.5)
WBC: 12.7 10*3/uL — ABNORMAL HIGH (ref 4.0–10.5)

## 2017-01-08 LAB — GLUCOSE, CAPILLARY
Glucose-Capillary: 158 mg/dL — ABNORMAL HIGH (ref 65–99)
Glucose-Capillary: 194 mg/dL — ABNORMAL HIGH (ref 65–99)
Glucose-Capillary: 206 mg/dL — ABNORMAL HIGH (ref 65–99)
Glucose-Capillary: 178 mg/dL — ABNORMAL HIGH (ref 65–99)

## 2017-01-08 LAB — HEMOGLOBIN A1C
Hgb A1c MFr Bld: 7.5 % — ABNORMAL HIGH (ref 4.8–5.6)
Mean Plasma Glucose: 168.55 mg/dL

## 2017-01-08 LAB — TSH: TSH: 2.809 u[IU]/mL (ref 0.350–4.500)

## 2017-01-08 LAB — PROTIME-INR
INR: 1.24
Prothrombin Time: 15.7 seconds — ABNORMAL HIGH (ref 11.4–15.2)

## 2017-01-08 MED ORDER — APIXABAN 5 MG PO TABS
5.0000 mg | ORAL_TABLET | Freq: Two times a day (BID) | ORAL | Status: DC
  Administered 2017-01-08 – 2017-01-09 (×3): 5 mg via ORAL
  Filled 2017-01-08 (×3): qty 1

## 2017-01-08 MED ORDER — HEPARIN BOLUS VIA INFUSION
4000.0000 [IU] | Freq: Once | INTRAVENOUS | Status: AC
  Administered 2017-01-08: 4000 [IU] via INTRAVENOUS
  Filled 2017-01-08: qty 4000

## 2017-01-08 MED ORDER — POTASSIUM CHLORIDE CRYS ER 20 MEQ PO TBCR
40.0000 meq | EXTENDED_RELEASE_TABLET | Freq: Once | ORAL | Status: AC
Start: 2017-01-08 — End: 2017-01-08
  Administered 2017-01-08: 20 meq via ORAL
  Filled 2017-01-08: qty 2

## 2017-01-08 MED ORDER — HEPARIN (PORCINE) IN NACL 100-0.45 UNIT/ML-% IJ SOLN
1400.0000 [IU]/h | INTRAMUSCULAR | Status: DC
  Administered 2017-01-08: 1400 [IU]/h via INTRAVENOUS
  Filled 2017-01-08: qty 250
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
## 2017-01-08 NOTE — Progress Notes (Signed)
NP Elray Mcgregor on call paged again about increased troponin. Per NP, since patient is asymptomatic, we will continue to monitor. Another troponin will be rechecked in the morning.  Nelle Sayed, RN
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
## 2017-01-08 NOTE — Progress Notes (Addendum)
PROGRESS NOTE                                                                                                                                                                                                             Patient Demographics:    Kaitlyn [name], is a 66 y.o. female, DOB - 24-Dec-1950, ZOX:[id number]  Admit date - 01/07/2017   Admitting Physician Eduard Clos, MD  Outpatient Primary MD for the patient is Patient, No Pcp Per  LOS - 0  Outpatient Specialists:  Chief Complaint  Patient presents with  . Palpitations    Afib RVR 190       Brief Narrative   66 year old obese female with history of persistent A. fib on anticoagulation (refused cardioversion), chronic systolic CHF with last echo showing EF of 30-35% (refused cardiac cath), type 2 diabetes mellitus and hypertension who presented to the ED with chest pressure and dizziness. After having lunch on the day of admission she had about 7 episodes of watery diarrhea. Denied any abdominal pain, nausea, fevers or chills. Following this she started having substernal chest pressure which was nonradiating and associated with dizziness and some headache. In the ED she was found to be in rapid A. fib. She reported missing last 2 doses of her Cardizem but did take her digoxin, metoprolol and anticoagulation. Patient admitted to telemetry. Troponin was initially negative but subsequently elevated.   Subjective:   Denies further chest pain symptoms or dizziness. Diarrhea resolved after taking loperamide yesterday before coming to the hospital.   Assessment  & Plan :    Principal Problem:   Atrial fibrillation with RVR (HCC) Rate controlled after receiving IV Cardizem in the ED. Resumed her home dose of Cardizem, metoprolol. Resume eliquis and digoxin. Patient has refused a DC cardioversion in the past.  Active Problems: NSTEMI Progressively elevated  troponin peaked at 2.02. No further chest pain symptoms. Continue telemetry monitoring. Was started on IV heparin but patient expressed not to have any cardiac cath and the switched back to Eliquis. Cycle troponins. Continue metoprolol. Check lipid panel.  Ischemic cardiomyopathy Last EF of 30-35%. Euvolemic. Check repeat echo. Continue beta blocker and digoxin.    Diarrhea Resolved  Diabetes mellitus type 2 Last A1c of 6.9. Not in any medications. Monitor on sliding scale coverage. Check repeat A1c.   Hypokalemia  Replenished  Morbid obesity     Code Status : Full code  Family Communication  : None at bedside  Disposition Plan  : Home possibly tomorrow if stable on telemetry overnight  Barriers For Discharge : Active symptoms  Consults  :  Cardiology  Procedures  :  2-D echo  DVT Prophylaxis  :  eliquis  Lab Results  Component Value Date   PLT 210 01/08/2017    Antibiotics  :   Anti-infectives    None        Objective:   Vitals:   01/07/17 2215 01/07/17 2305 01/08/17 0500 01/08/17 1215  BP: 124/82 113/70 109/73 128/85  Pulse: 97 (!) 104 (!) 108 92  Resp: 17 18 18 20   Temp:  97.8 F (36.6 C) 98.7 F (37.1 C) (!) 97.5 F (36.4 C)  TempSrc:  Oral Oral Oral  SpO2: 96% 98% 98% 96%  Weight:  (!) 140.3 kg (309 lb 6.4 oz) (!) 140.7 kg (310 lb 3.2 oz)   Height:  5\' 10"  (1.778 m)      Wt Readings from Last 3 Encounters:  01/08/17 (!) 140.7 kg (310 lb 3.2 oz)  10/04/16 127 kg (280 lb)  07/28/16 131.3 kg (289 lb 6.4 oz)     Intake/Output Summary (Last 24 hours) at 01/08/17 1229 Last data filed at 01/08/17 0936  Gross per 24 hour  Intake          1503.25 ml  Output              200 ml  Net          1303.25 ml     Physical Exam  Gen: Elderly obese female not in distress HEENT:  moist mucosa, supple neck Chest: clear b/l, no added sounds CVS: S1 and S2 irregular, no murmurs rub or gallop GI: soft, NT, ND,  Musculoskeletal: warm, no  edema     Data Review:    CBC  Recent Labs Lab 01/07/17 1815 01/08/17 0406  WBC 18.1* 12.7*  HGB 14.7 12.0  HCT 44.7 37.5  PLT 273 210  MCV 108.2* 110.0*  MCH 35.6* 35.2*  MCHC 32.9 32.0  RDW 16.1* 17.2*  LYMPHSABS 1.8  --   MONOABS 0.8  --   EOSABS 0.3  --   BASOSABS 0.1  --     Chemistries   Recent Labs Lab 01/07/17 1815 01/08/17 0406  NA 139 138  K 3.6 3.4*  CL 107 106  CO2 21* 23  GLUCOSE 252* 181*  BUN 10 13  CREATININE 1.13* 1.08*  CALCIUM 8.4* 8.3*  MG 1.9  --    ------------------------------------------------------------------------------------------------------------------ No results for input(s): CHOL, HDL, LDLCALC, TRIG, CHOLHDL, LDLDIRECT in the last 72 hours.  Lab Results  Component Value Date   HGBA1C 7.6 (H) 01/07/2017   ------------------------------------------------------------------------------------------------------------------  Recent Labs  01/07/17 2255  TSH 2.809   ------------------------------------------------------------------------------------------------------------------ No results for input(s): VITAMINB12, FOLATE, FERRITIN, TIBC, IRON, RETICCTPCT in the last 72 hours.  Coagulation profile  Recent Labs Lab 01/07/17 1815 01/08/17 0406  INR 1.06 1.24    No results for input(s): DDIMER in the last 72 hours.  Cardiac Enzymes  Recent Labs Lab 01/07/17 2255 01/08/17 0406 01/08/17 1002  TROPONINI 0.99* 2.02* 1.50*   ------------------------------------------------------------------------------------------------------------------    Component Value Date/Time   BNP 205.5 (H) 01/07/2017 1813    Inpatient Medications  Scheduled Meds: . apixaban  5 mg Oral BID  . digoxin  0.125 mg Oral Daily  . diltiazem  240 mg Oral Daily  . insulin aspart  0-9 Units Subcutaneous TID WC  . metoprolol succinate  50 mg Oral BID   Continuous Infusions: PRN Meds:.acetaminophen **OR** acetaminophen, ondansetron **OR**  ondansetron (ZOFRAN) IV  Micro Results No results found for this or any previous visit (from the past 240 hour(s)).  Radiology Reports Dg Chest Port 1 View  Result Date: 01/07/2017 CLINICAL DATA:  Atrial fib EXAM: PORTABLE CHEST 1 VIEW COMPARISON:  10/04/2016 FINDINGS: 1835 hours. The cardio pericardial silhouette is enlarged. There is pulmonary vascular congestion without overt pulmonary edema. The lungs are clear without focal pneumonia, edema, pneumothorax or pleural effusion. The visualized bony structures of the thorax are intact. Telemetry leads overlie the chest. IMPRESSION: Cardiomegaly with vascular congestion. Electronically Signed   By: Kennith Center M.D.   On: 01/07/2017 18:58    Time Spent in minutes  35   Eddie North M.D on 01/08/2017 at 12:29 PM  Between 7am to 7pm - Pager - [phone]  After 7pm go to [email or web address] - password [facility]  [facility] -  Office  [phone]

## 2017-01-08 NOTE — Progress Notes (Signed)
Patient troponin 2.02 this morning. Patient asymptomatic, no chest pain 0/10. Patient reports that she is doing good, no complaints during the night shift/morning. Paged NP Elray Mcgregor on call. Awaiting on call back.   Wynn Alldredge, RN

## 2017-01-08 NOTE — Progress Notes (Signed)
Progress Note  Patient Name: Kaitlyn [name] Date of Encounter: 01/08/2017  Primary Cardiologist: Dr Shirlee Latch Primary Electrophysiologist:  Dr Elberta Fortis  Subjective   No chest pain today.   Inpatient Medications    Scheduled Meds: . digoxin  0.125 mg Oral Kaitlyn  . diltiazem  240 mg Oral Kaitlyn  . insulin aspart  0-9 Units Subcutaneous TID WC  . metoprolol succinate  50 mg Oral BID   Continuous Infusions: . heparin 1,400 Units/hr (01/08/17 0845)   PRN Meds: acetaminophen **OR** acetaminophen, ondansetron **OR** ondansetron (ZOFRAN) IV   Vital Signs    Vitals:   01/07/17 2200 01/07/17 2215 01/07/17 2305 01/08/17 0500  BP: 129/84 124/82 113/70 109/73  Pulse: 98 97 (!) 104 (!) 108  Resp: (!) 21 17 18 18   Temp:   97.8 F (36.6 C) 98.7 F (37.1 C)  TempSrc:   Oral Oral  SpO2: 96% 96% 98% 98%  Weight:   (!) 309 lb 6.4 oz (140.3 kg) (!) 310 lb 3.2 oz (140.7 kg)  Height:   5\' 10"  (1.778 m)     Intake/Output Summary (Last 24 hours) at 01/08/17 1109 Last data filed at 01/08/17 0936  Gross per 24 hour  Intake          1503.25 ml  Output              200 ml  Net          1303.25 ml   Filed Weights   01/07/17 1803 01/07/17 2305 01/08/17 0500  Weight: 280 lb (127 kg) (!) 309 lb 6.4 oz (140.3 kg) (!) 310 lb 3.2 oz (140.7 kg)    Telemetry    A-fib with ventricular rates 92-96 BPM - Personally Reviewed  ECG    A-fib with RVR - Personally Reviewed  Physical Exam  GEN: No acute distress.   Neck: No JVD Cardiac: iRRR, no murmurs, rubs, or gallops.  Respiratory: crackles bilaterally. GI: Soft, nontender, non-distended  MS: No edema; No deformity. Neuro:  Nonfocal  Psych: Normal affect   Labs    Chemistry Recent Labs Lab 01/07/17 1815 01/08/17 0406  NA 139 138  K 3.6 3.4*  CL 107 106  CO2 21* 23  GLUCOSE 252* 181*  BUN 10 13  CREATININE 1.13* 1.08*  CALCIUM 8.4* 8.3*  GFRNONAA 50* 52*  GFRAA 57* >60  ANIONGAP 11 9     Hematology Recent Labs Lab  01/07/17 1815 01/08/17 0406  WBC 18.1* 12.7*  RBC 4.13 3.41*  HGB 14.7 12.0  HCT 44.7 37.5  MCV 108.2* 110.0*  MCH 35.6* 35.2*  MCHC 32.9 32.0  RDW 16.1* 17.2*  PLT 273 210    Cardiac Enzymes Recent Labs Lab 01/07/17 1815 01/07/17 2255 01/08/17 0406  TROPONINI <0.03 0.99* 2.02*   No results for input(s): TROPIPOC in the last 168 hours.   BNP Recent Labs Lab 01/07/17 1813  BNP 205.5*    DDimer No results for input(s): DDIMER in the last 168 hours.   Radiology    Dg Chest Port 1 View  Result Date: 01/07/2017 CLINICAL DATA:  Atrial fib EXAM: PORTABLE CHEST 1 VIEW COMPARISON:  10/04/2016 FINDINGS: 1835 hours. The cardio pericardial silhouette is enlarged. There is pulmonary vascular congestion without overt pulmonary edema. The lungs are clear without focal pneumonia, edema, pneumothorax or pleural effusion. The visualized bony structures of the thorax are intact. Telemetry leads overlie the chest. IMPRESSION: Cardiomegaly with vascular congestion. Electronically Signed   By: Jamison Oka.D.  On: 01/07/2017 18:58   Cardiac Studies   TTE: 07/26/2016 - Left ventricle: The cavity size was normal. Wall thickness was   increased in a pattern of mild LVH. Systolic function was   moderately to severely reduced. The estimated ejection fraction   was in the range of 30% to 35%. Wall motion was normal; there   were no regional wall motion abnormalities. The study is not   technically sufficient to allow evaluation of LV diastolic   function. - Mitral valve: Calcified annulus. - Left atrium: The atrium was severely dilated. Volume/bsa, ES   (1-plane Simpson&'s, A4C): 74.4 ml/m^2. - Right atrium: The atrium was mildly dilated.  Impressions: - EF is mildly improved when compared to prior study (20-25%)    Patient Profile     66 y.o. female  with a hx of AF, CM, DM, and obesity who is being seen today for the evaluation of AF with RVR at the request of Dr  Vivia Ewing.  Assessment & Plan    NSTEMI  - Troponin 0.03->0.99->2.02 - on iv heparin drip, she refuses a cath, she is very clear about it and understands risks - we will continue trending troponin until trends down, I will start Eliquis 5 mg po BID now  AF with RVR She is permanent AF now at least for the last 6 months. CHADs VASc=3, refused DCCV in the past, resume Eliquis  Non compliance with medications Recurring issues with medication compliance and OP f/u  Diarrhea- Resolved  Leukocytosis- This appears to be chronic. Never worked up as far as I can tell  Cardiomyopathy- She does not appear to be in CHF on exam   Signed, Tobias Alexander, MD  01/08/2017, 11:09 AM

## 2017-01-08 NOTE — Progress Notes (Signed)
Patient troponin 0.99. Patient resting, denies chest pain 0/10, reports that she is doing good.  [facility] NP Elray Mcgregor notified. Awaiting on call back.  Will continue to monitor.  Ferron Ishmael, RN

## 2017-01-08 NOTE — Progress Notes (Signed)
New Admission Note:   Arrival Method: From ED Mental Orientation: A&O X 4 Assessment: Completed Pain:0/10 Tubes: None Safety Measures: Safety Fall Prevention Plan has been discussed  Admission:  [facility] Orientation: Patient has been orientated to the room, unit and staff.  Family: none at bedside  Orders to be reviewed and implemented. Will continue to monitor the patient. Call light has been placed within reach. Patient refuses bed alarm. Will call when assistance is needed.   Patient has home medications with her. Educated about hospital policy.However, she stated that she will not be taking medications on her own but  But she does not want to send them to the pharmacy.She will send medications with family tomorrow per pt. Will continue to monitor.   Bettey Mare, RN Phone: [phone]

## 2017-01-09 ENCOUNTER — Inpatient Hospital Stay (HOSPITAL_COMMUNITY): Payer: Medicare Other

## 2017-01-09 DIAGNOSIS — I255 Ischemic cardiomyopathy: Secondary | ICD-10-CM | POA: Diagnosis present

## 2017-01-09 DIAGNOSIS — E1165 Type 2 diabetes mellitus with hyperglycemia: Secondary | ICD-10-CM

## 2017-01-09 DIAGNOSIS — I214 Non-ST elevation (NSTEMI) myocardial infarction: Secondary | ICD-10-CM | POA: Diagnosis present

## 2017-01-09 DIAGNOSIS — Z9114 Patient's other noncompliance with medication regimen: Secondary | ICD-10-CM

## 2017-01-09 DIAGNOSIS — E876 Hypokalemia: Secondary | ICD-10-CM

## 2017-01-09 DIAGNOSIS — Z9119 Patient's noncompliance with other medical treatment and regimen: Secondary | ICD-10-CM

## 2017-01-09 LAB — GLUCOSE, CAPILLARY
Glucose-Capillary: 163 mg/dL — ABNORMAL HIGH (ref 65–99)
Glucose-Capillary: 187 mg/dL — ABNORMAL HIGH (ref 65–99)

## 2017-01-09 LAB — TROPONIN I: Troponin I: 0.83 ng/mL (ref <0.03)

## 2017-01-09 MED ORDER — DILTIAZEM HCL ER COATED BEADS 240 MG PO CP24: 240.0000 mg | ORAL_CAPSULE | Freq: Every day | ORAL | 0 refills | Status: DC

## 2017-01-09 MED ORDER — POTASSIUM CHLORIDE CRYS ER 20 MEQ PO TBCR
40.0000 meq | EXTENDED_RELEASE_TABLET | Freq: Once | ORAL | Status: AC
  Administered 2017-01-09: 40 meq via ORAL
  Filled 2017-01-09: qty 2

## 2017-01-09 MED ORDER — LOPERAMIDE HCL 2 MG PO CAPS: 4.0000 mg | ORAL_CAPSULE | ORAL | Status: DC | PRN

## 2017-01-09 MED ORDER — METFORMIN HCL 500 MG PO TABS: 500.0000 mg | ORAL_TABLET | Freq: Two times a day (BID) | ORAL | 0 refills | Status: DC

## 2017-01-09 MED ORDER — LOPERAMIDE HCL 1 MG/5ML PO LIQD
4.0000 mg | Freq: Once | ORAL | Status: AC
  Administered 2017-01-09: 4 mg via ORAL
  Filled 2017-01-09: qty 20

## 2017-01-09 NOTE — Progress Notes (Signed)
Patient continues to be very upset that she cannot get a "bag of medicine for free" like she has received on multiple admissions per her.  Patient states usually Dr. Shirlee Latch has her a bag of free medicines when she leaves the hospital.  I encouraged patient to contact Dr. Alford Highland office tomorrow to see if he has samples in his office as we simply cannot give her any free medicines from [facility].  Patient also requesting [facility] take her home.  Social Work offered a taxi however patient is adamant she has taken [facility] before and they are "very nice and funny and even put the key in the door for me".  Patient states she cannot walk to her door from the taxi and must go by [facility].  RN informed social work of the above, social work advised that patient will receive a bill for this service which patient states she is fine with.

## 2017-01-09 NOTE — Progress Notes (Signed)
[facility] here to transport patient home.  Discharge instructions have been reviewed with patient and prescriptions given for medications.  Patients questions were answered, verbalized understanding.

## 2017-01-09 NOTE — Care Management Note (Signed)
Case Management Note  Patient Details  Name: Almendra Horman MRN: [id number] Date of Birth: 09/29/1950  Subjective/Objective: 66 y.o. F who tells me she has $0.90 to live on until she receives her next SS check and does not have enough medication to last until she can have it refilled. CM unable to assist with MATCH, etc as pt receives Medicare and is not eligible for this program. She tell  me she has had assist in the past from [facility] dept when she was given "free bags of medications". Offered GoodRx, OV where she might be able to obtain samples, etc. Pt not willing to listen to any solutions other than free medications. No family, No friends, No faith community available to assist.                    Action/Plan: CM will sign off for now but will be available should additional discharge needs arise or disposition change.    Expected Discharge Date:                  Expected Discharge Plan:     In-House Referral:     Discharge planning Services  CM Consult, Medication Assistance  Post Acute Care Choice:  NA Choice offered to:  Patient  DME Arranged:    DME Agency:     HH Arranged:    HH Agency:     Status of Service:  Completed, signed off  If discussed at [employer] of Stay Meetings, dates discussed:    Additional Comments:  Yvone Neu, RN 01/09/2017, 1:23 PM

## 2017-01-09 NOTE — Progress Notes (Addendum)
Progress Note  Patient Name: Kaitlyn [name] Date of Encounter: 01/09/2017  Primary Cardiologist: Dr Shirlee Latch Primary Electrophysiologist:  Dr Elberta Fortis  Subjective   No chest pain today. Improved SOB.   Inpatient Medications    Scheduled Meds: . apixaban  5 mg Oral BID  . digoxin  0.125 mg Oral Daily  . diltiazem  240 mg Oral Daily  . insulin aspart  0-9 Units Subcutaneous TID WC  . metoprolol succinate  50 mg Oral BID   Continuous Infusions:  PRN Meds: acetaminophen **OR** acetaminophen, ondansetron **OR** ondansetron (ZOFRAN) IV   Vital Signs    Vitals:   01/08/17 1936 01/08/17 2130 01/09/17 0150 01/09/17 0548  BP: 136/74 136/70 (!) 113/56 (!) 155/83  Pulse: 77 80 67 89  Resp: 18  18 18   Temp: 97.7 F (36.5 C)  98 F (36.7 C) 97.7 F (36.5 C)  TempSrc: Oral  Oral Oral  SpO2: 99%  95% 96%  Weight:    (!) 311 lb 9.6 oz (141.3 kg)  Height:        Intake/Output Summary (Last 24 hours) at 01/09/17 0920 Last data filed at 01/09/17 0856  Gross per 24 hour  Intake             1440 ml  Output             1450 ml  Net              -10 ml   Filed Weights   01/07/17 2305 01/08/17 0500 01/09/17 0548  Weight: (!) 309 lb 6.4 oz (140.3 kg) (!) 310 lb 3.2 oz (140.7 kg) (!) 311 lb 9.6 oz (141.3 kg)    Telemetry    A-fib with ventricular rates 92-96 BPM - Personally Reviewed  ECG    A-fib with ventricular rates 67-82 BPM - Personally Reviewed  Physical Exam  GEN: No acute distress.   Neck: No JVD Cardiac: iRRR, no murmurs, rubs, or gallops.  Respiratory: crackles bilaterally. GI: Soft, nontender, non-distended  MS: No edema; No deformity. Neuro:  Nonfocal  Psych: Normal affect   Labs    Chemistry  Recent Labs Lab 01/07/17 1815 01/08/17 0406  NA 139 138  K 3.6 3.4*  CL 107 106  CO2 21* 23  GLUCOSE 252* 181*  BUN 10 13  CREATININE 1.13* 1.08*  CALCIUM 8.4* 8.3*  GFRNONAA 50* 52*  GFRAA 57* >60  ANIONGAP 11 9     Hematology  Recent Labs Lab  01/07/17 1815 01/08/17 0406  WBC 18.1* 12.7*  RBC 4.13 3.41*  HGB 14.7 12.0  HCT 44.7 37.5  MCV 108.2* 110.0*  MCH 35.6* 35.2*  MCHC 32.9 32.0  RDW 16.1* 17.2*  PLT 273 210    Cardiac Enzymes  Recent Labs Lab 01/08/17 1002 01/08/17 1220 01/08/17 1655 01/09/17 0011  TROPONINI 1.50* 1.19* 0.93* 0.83*   No results for input(s): TROPIPOC in the last 168 hours.   BNP  Recent Labs Lab 01/07/17 1813  BNP 205.5*    DDimer No results for input(s): DDIMER in the last 168 hours.   Radiology    Dg Chest Port 1 View  Result Date: 01/07/2017 CLINICAL DATA:  Atrial fib EXAM: PORTABLE CHEST 1 VIEW COMPARISON:  10/04/2016 FINDINGS: 1835 hours. The cardio pericardial silhouette is enlarged. There is pulmonary vascular congestion without overt pulmonary edema. The lungs are clear without focal pneumonia, edema, pneumothorax or pleural effusion. The visualized bony structures of the thorax are intact. Telemetry leads overlie the chest.  IMPRESSION: Cardiomegaly with vascular congestion. Electronically Signed   By: Kennith Center M.D.   On: 01/07/2017 18:58   Cardiac Studies   TTE: 07/26/2016 - Left ventricle: The cavity size was normal. Wall thickness was   increased in a pattern of mild LVH. Systolic function was   moderately to severely reduced. The estimated ejection fraction   was in the range of 30% to 35%. Wall motion was normal; there   were no regional wall motion abnormalities. The study is not   technically sufficient to allow evaluation of LV diastolic   function. - Mitral valve: Calcified annulus. - Left atrium: The atrium was severely dilated. Volume/bsa, ES   (1-plane Simpson&'s, A4C): 74.4 ml/m^2. - Right atrium: The atrium was mildly dilated.  Impressions: - EF is mildly improved when compared to prior study (20-25%)    Patient Profile     66 y.o. female  with a hx of AF, CM, DM, and obesity who is being seen today for the evaluation of AF with RVR at the  request of Dr Vivia Ewing.   Assessment & Plan    NSTEMI  - Troponin 0.03->0.99->2.02->0.83 - on Eliquis 5 mg po BID now  AF with RVR She is permanent AF now at least for the last 6 months. CHADs VASc=3, refused DCCV in the past, resume Eliquis Now rte controlled after restarting Diltiazem  Non compliance with medications Recurring issues with medication compliance and OP f/u, we will have CM involved  Diarrhea- Resolved  Leukocytosis- This appears to be chronic. Never worked up as far as I can tell  Cardiomyopathy- She does not appear to be in CHF on exam  DM  - on no therapy, I would start mertformin 500 mg po BID  She can be discharged today, the main focus of therapy should be her medication compliance. I will replace potassium before discharge.  Signed, Tobias Alexander, MD  01/09/2017, 9:20 AM

## 2017-01-09 NOTE — Plan of Care (Signed)
Problem: Health Behavior/Discharge Planning: Goal: Ability to manage health-related needs will improve Outcome: Adequate for Discharge Patient has follow-up with cardiologist

## 2017-01-09 NOTE — Discharge Summary (Signed)
Physician Discharge Summary  Kaitlyn [name] WUJ:[id number] DOB: Sep 08, 1950 DOA: 01/07/2017  PCP: Patient, No Pcp Per  Admit date: 01/07/2017 Discharge date: 01/09/2017  Admitted From: Home Disposition:  Home  Recommendations for Outpatient Follow-up:  1. Follow up with cardiology in 2 weeks 2. Significant issue with noncompliance and medication nonadherence.  Home Health: None Equipment/Devices: None  Discharge Condition: guarded CODE STATUS: Full code Diet recommendation: Heart Healthy / Carb Modified     Discharge Diagnoses:  Principal Problem:   Atrial fibrillation with RVR (HCC)   Active Problems:   H/O noncompliance with medical treatment, presenting hazards to health   Cardiomyopathy-EF 30-35%% by echo 07/2016   Diabetes type 2, uncontrolled (HCC)   Diarrhea   Chest pain   NSTEMI (non-ST elevated myocardial infarction) (HCC)   Ischemic cardiomyopathy   Obesity, Class III, BMI 40-49.9 (morbid obesity) (HCC)  Brief narrative/history of present illness 66 year old morbidly obese female with history of persistent A. fib on anticoagulation (refused cardioversion), chronic systolic CHF with last echo showing EF of 30-35% (refused cardiac cath), type 2 diabetes mellitus and hypertension who presented to the ED with chest pressure and dizziness. After having lunch on the day of admission she had about 7 episodes of watery diarrhea. Denied any abdominal pain, nausea, fevers or chills. Following this she started having substernal chest pressure which was nonradiating and associated with dizziness and some headache. In the ED she was found to be in rapid A. fib. She reported missing last 2 doses of her Cardizem but did take her digoxin, metoprolol and anticoagulation. Patient admitted to telemetry. Troponin was initially negative but subsequently elevated suggestive of NSTEMI.  Hospital course  Principal Problem:   Atrial fibrillation with RVR (HCC) Rate controlled after  receiving IV Cardizem in the ED. Resumed her home dose of Cardizem, metoprolol. Resumed eliquis and digoxin. Patient persistently refused a DC cardioversion.  Active Problems: NSTEMI Progressively elevated troponin peaked at 2.02. No further chest pain symptoms. Stable on telemetry. Patient refuses to have cardiac cath again. Cardiology consult appreciated. Continue metoprolol. LDL normal.   Ischemic cardiomyopathy Last EF of 30-35%. Euvolemic. Continue beta blocker and digoxin. Not on ACE inhibitor due to intolerance.    Diarrhea Resolved  Diabetes mellitus type 2 A1c of 7.5. Not in any medications. Start her on low-dose metformin. Instructed her on getting a glucometer and test supplies.   Hypokalemia Replenished  Morbid obesity Counseled on weight loss and exercise.  Noncompliance with medication. Patient reports living on nominal Social Security and does not have enough money to buy medications. She receives most of her medications when outside pharmacy that delivers to her house. She reports that she does not have enough medication to last until she gets her next Social Security check and can refill her prescriptions. Patient not eligible for [facility] program as she has Medicare. Case manager discussed with patient at length. Patient only interested in getting free medications on the hospital and not willing to listen to other options for getting free samples of her medications.  Cardiology will arrange outpatient follow-up and plan to provide medication refills.  Family Communication  : None at bedside  Disposition Plan  : Home  Consults  :  Cardiology  Procedures  :  None   Discharge Instructions  Discharge Instructions    Amb referral to AFIB Clinic    Complete by:  As directed      Allergies as of 01/09/2017      Reactions   Caffeine Other (See  Comments)   Seizure from large doses, heart races from small doses   Cranberry Shortness Of Breath,  Diarrhea, Itching, Other (See Comments)   Severe headache.."Ocean Spray cranberry juice"   Lisinopril Diarrhea, Itching, Swelling   Penicillins Other (See Comments)   Unknown childhood allergy Has patient had a PCN reaction causing immediate rash, facial/tongue/throat swelling, SOB or lightheadedness with hypotension: unknown Has patient had a PCN reaction causing severe rash involving mucus membranes or skin necrosis: unknown Has patient had a PCN reaction that required hospitalization unknown Has patient had a PCN reaction occurring within the last 10 years: unknown If all of the above answers are "NO", then may proceed with Cephalosporin use.   Other Other (See Comments)   Stimulants Pt has a past hx of seizures      Medication List    STOP taking these medications   EPINEPHrine 0.3 mg/0.3 mL Soaj injection Commonly known as:  EPIPEN 2-PAK   furosemide 40 MG tablet Commonly known as:  LASIX   Potassium Chloride ER 20 MEQ Tbcr     TAKE these medications   apixaban 5 MG Tabs tablet Commonly known as:  ELIQUIS Take 1 tablet (5 mg total) by mouth 2 (two) times daily.   aspirin 325 MG tablet Take 325 mg by mouth every 6 (six) hours as needed.   digoxin 0.125 MG tablet Commonly known as:  LANOXIN Take 1 tablet (0.125 mg total) by mouth daily.   diltiazem 240 MG 24 hr capsule Commonly known as:  CARDIZEM CD Take 1 capsule (240 mg total) by mouth daily.   metFORMIN 500 MG tablet Commonly known as:  GLUCOPHAGE Take 1 tablet (500 mg total) by mouth 2 (two) times daily with a meal.   metoprolol succinate 50 MG 24 hr tablet Commonly known as:  TOPROL-XL Take 1 tablet (50 mg total) by mouth 2 (two) times daily. Take with or immediately following a meal.            Discharge Care Instructions        Start     Ordered   01/09/17 0000  diltiazem (CARDIZEM CD) 240 MG 24 hr capsule  Daily     01/09/17 1328   01/09/17 0000  metFORMIN (GLUCOPHAGE) 500 MG tablet  2 times  daily with meals     01/09/17 1332   01/07/17 0000  Amb referral to AFIB Clinic     01/07/17 1804     Follow-up Information    Lars Masson, MD Follow up.   Specialty:  Cardiology Why:  The office will call to arrange follow up with Dr. Delton See or a PA/NP Contact information: [address] [place] [address] [phone]          Allergies  Allergen Reactions  . Caffeine Other (See Comments)    Seizure from large doses, heart races from small doses  . Cranberry Shortness Of Breath, Diarrhea, Itching and Other (See Comments)    Severe headache.."Ocean Spray cranberry juice"  . Lisinopril Diarrhea, Itching and Swelling  . Penicillins Other (See Comments)    Unknown childhood allergy Has patient had a PCN reaction causing immediate rash, facial/tongue/throat swelling, SOB or lightheadedness with hypotension: unknown Has patient had a PCN reaction causing severe rash involving mucus membranes or skin necrosis: unknown Has patient had a PCN reaction that required hospitalization unknown Has patient had a PCN reaction occurring within the last 10 years: unknown If all of the above answers are "NO",  then may proceed with Cephalosporin use.   . Other Other (See Comments)    Stimulants Pt has a past hx of seizures        Procedures/Studies: Dg Chest Port 1 View  Result Date: 01/07/2017 CLINICAL DATA:  Atrial fib EXAM: PORTABLE CHEST 1 VIEW COMPARISON:  10/04/2016 FINDINGS: 1835 hours. The cardio pericardial silhouette is enlarged. There is pulmonary vascular congestion without overt pulmonary edema. The lungs are clear without focal pneumonia, edema, pneumothorax or pleural effusion. The visualized bony structures of the thorax are intact. Telemetry leads overlie the chest. IMPRESSION: Cardiomegaly with vascular congestion. Electronically Signed   By: Kennith Center M.D.   On: 01/07/2017 18:58       Subjective: Denies further chest pain symptoms.  Reported 2 episodes of watery diarrhea this morning. No further chest pain symptoms and rate controlled on monitor.  Discharge Exam: Vitals:   01/09/17 1022 01/09/17 1240  BP: 128/66   Pulse: 92 94  Resp:  20  Temp:  98.2 F (36.8 C)  SpO2: 95%    Vitals:   01/09/17 0150 01/09/17 0548 01/09/17 1022 01/09/17 1240  BP: (!) 113/56 (!) 155/83 128/66   Pulse: 67 89 92 94  Resp: 18 18  20   Temp: 98 F (36.7 C) 97.7 F (36.5 C)  98.2 F (36.8 C)  TempSrc: Oral Oral  Oral  SpO2: 95% 96% 95%   Weight:  (!) 141.3 kg (311 lb 9.6 oz)    Height:        General: Morbidly obese elderly female not in distress HEENT: Moist mucosa, supple neck Chest: Clear bilaterally CVS: Normal S1 and S2, no murmurs or gallop GI: Soft, nondistended, nontender Musculoskeletal: Warm, no edema      The results of significant diagnostics from this hospitalization (including imaging, microbiology, ancillary and laboratory) are listed below for reference.     Microbiology: No results found for this or any previous visit (from the past 240 hour(s)).   Labs: BNP (last 3 results)  Recent Labs  07/25/16 1545 01/07/17 1813  BNP 209.8* 205.5*   Basic Metabolic Panel:  Recent Labs Lab 01/07/17 1815 01/08/17 0406  NA 139 138  K 3.6 3.4*  CL 107 106  CO2 21* 23  GLUCOSE 252* 181*  BUN 10 13  CREATININE 1.13* 1.08*  CALCIUM 8.4* 8.3*  MG 1.9  --    Liver Function Tests: No results for input(s): AST, ALT, ALKPHOS, BILITOT, PROT, ALBUMIN in the last 168 hours. No results for input(s): LIPASE, AMYLASE in the last 168 hours. No results for input(s): AMMONIA in the last 168 hours. CBC:  Recent Labs Lab 01/07/17 1815 01/08/17 0406  WBC 18.1* 12.7*  NEUTROABS 15.1*  --   HGB 14.7 12.0  HCT 44.7 37.5  MCV 108.2* 110.0*  PLT 273 210   Cardiac Enzymes:  Recent Labs Lab 01/08/17 0406 01/08/17 1002 01/08/17 1220 01/08/17 1655 01/09/17 0011  TROPONINI 2.02* 1.50* 1.19* 0.93* 0.83*    BNP: Invalid input(s): POCBNP CBG:  Recent Labs Lab 01/08/17 1126 01/08/17 1628 01/08/17 2101 01/09/17 0728 01/09/17 1128  GLUCAP 194* 206* 178* 163* 187*   D-Dimer No results for input(s): DDIMER in the last 72 hours. Hgb A1c  Recent Labs  01/07/17 2255 01/08/17 1243  HGBA1C 7.6* 7.5*   Lipid Profile  Recent Labs  01/08/17 1244  CHOL 138  HDL 32*  LDLCALC 71  TRIG 161*  CHOLHDL 4.3   Thyroid function studies  Recent Labs  01/07/17  2255  TSH 2.809   Anemia work up No results for input(s): VITAMINB12, FOLATE, FERRITIN, TIBC, IRON, RETICCTPCT in the last 72 hours. Urinalysis    Component Value Date/Time   COLORURINE YELLOW 07/25/2016 1100   APPEARANCEUR HAZY (A) 07/25/2016 1100   LABSPEC 1.013 07/25/2016 1100   PHURINE 5.0 07/25/2016 1100   GLUCOSEU 50 (A) 07/25/2016 1100   HGBUR NEGATIVE 07/25/2016 1100   BILIRUBINUR NEGATIVE 07/25/2016 1100   KETONESUR NEGATIVE 07/25/2016 1100   PROTEINUR NEGATIVE 07/25/2016 1100   UROBILINOGEN 1.0 12/14/2014 1103   NITRITE NEGATIVE 07/25/2016 1100   LEUKOCYTESUR MODERATE (A) 07/25/2016 1100   Sepsis Labs Invalid input(s): PROCALCITONIN,  WBC,  LACTICIDVEN Microbiology No results found for this or any previous visit (from the past 240 hour(s)).   Time coordinating discharge: Over 30 minutes  SIGNED:   Eddie North, MD  [facility] 01/09/2017, 1:33 PM Pager   If 7PM-7AM, please contact night-coverage [email or web address] Password [email or web address]

## 2017-01-12 ENCOUNTER — Telehealth (HOSPITAL_COMMUNITY): Payer: Self-pay | Admitting: *Deleted

## 2017-01-12 NOTE — Telephone Encounter (Signed)
LMOM for pt to call us back to schedule an appt..  Referral was received from ED.

## 2017-01-29 ENCOUNTER — Other Ambulatory Visit: Payer: Self-pay | Admitting: Cardiology

## 2017-05-13 ENCOUNTER — Other Ambulatory Visit (HOSPITAL_COMMUNITY): Payer: Self-pay | Admitting: Cardiology

## 2017-05-16 ENCOUNTER — Emergency Department (HOSPITAL_COMMUNITY): Payer: Medicare Other

## 2017-05-16 ENCOUNTER — Emergency Department (HOSPITAL_COMMUNITY)
Admission: EM | Admit: 2017-05-16 | Discharge: 2017-05-16 | Disposition: A | Discharge status: Home / Self Care | Payer: Medicare Other | Attending: Emergency Medicine | Admitting: Emergency Medicine

## 2017-05-16 DIAGNOSIS — E119 Type 2 diabetes mellitus without complications: Secondary | ICD-10-CM | POA: Diagnosis not present

## 2017-05-16 DIAGNOSIS — Z7982 Long term (current) use of aspirin: Secondary | ICD-10-CM | POA: Insufficient documentation

## 2017-05-16 DIAGNOSIS — Z7901 Long term (current) use of anticoagulants: Secondary | ICD-10-CM | POA: Insufficient documentation

## 2017-05-16 DIAGNOSIS — Z79899 Other long term (current) drug therapy: Secondary | ICD-10-CM | POA: Insufficient documentation

## 2017-05-16 DIAGNOSIS — I5042 Chronic combined systolic (congestive) and diastolic (congestive) heart failure: Secondary | ICD-10-CM | POA: Diagnosis not present

## 2017-05-16 DIAGNOSIS — I110 Hypertensive heart disease with heart failure: Secondary | ICD-10-CM | POA: Insufficient documentation

## 2017-05-16 DIAGNOSIS — I4891 Unspecified atrial fibrillation: Secondary | ICD-10-CM | POA: Diagnosis not present

## 2017-05-16 DIAGNOSIS — R002 Palpitations: Secondary | ICD-10-CM | POA: Diagnosis present

## 2017-05-16 DIAGNOSIS — I252 Old myocardial infarction: Secondary | ICD-10-CM | POA: Diagnosis not present

## 2017-05-16 DIAGNOSIS — Z9114 Patient's other noncompliance with medication regimen: Secondary | ICD-10-CM

## 2017-05-16 DIAGNOSIS — R0602 Shortness of breath: Secondary | ICD-10-CM | POA: Insufficient documentation

## 2017-05-16 DIAGNOSIS — Z7984 Long term (current) use of oral hypoglycemic drugs: Secondary | ICD-10-CM | POA: Diagnosis not present

## 2017-05-16 LAB — BASIC METABOLIC PANEL
Anion gap: 9 (ref 5–15)
BUN: 17 mg/dL (ref 6–20)
CO2: 20 mmol/L — ABNORMAL LOW (ref 22–32)
Calcium: 8 mg/dL — ABNORMAL LOW (ref 8.9–10.3)
Chloride: 106 mmol/L (ref 101–111)
Creatinine, Ser: 0.89 mg/dL (ref 0.44–1.00)
GFR calc Af Amer: >60 mL/min (ref >60)
GFR calc non Af Amer: >60 mL/min (ref >60)
Glucose, Bld: 218 mg/dL — ABNORMAL HIGH (ref 65–99)
Potassium: 4.3 mmol/L (ref 3.5–5.1)
Sodium: 135 mmol/L (ref 135–145)

## 2017-05-16 LAB — PHOSPHORUS: Phosphorus: 2.8 mg/dL (ref 2.5–4.6)

## 2017-05-16 LAB — CBC
HCT: 32.5 % — ABNORMAL LOW (ref 36.0–46.0)
Hemoglobin: 10.2 g/dL — ABNORMAL LOW (ref 12.0–15.0)
MCH: 36.2 pg — ABNORMAL HIGH (ref 26.0–34.0)
MCHC: 31.4 g/dL (ref 30.0–36.0)
MCV: 115.2 fL — ABNORMAL HIGH (ref 78.0–100.0)
Platelets: 231 10*3/uL (ref 150–400)
RBC: 2.82 MIL/uL — ABNORMAL LOW (ref 3.87–5.11)
RDW: 17.1 % — ABNORMAL HIGH (ref 11.5–15.5)
WBC: 12 10*3/uL — ABNORMAL HIGH (ref 4.0–10.5)

## 2017-05-16 LAB — DIGOXIN LEVEL: Digoxin Level: <0.2 ng/mL — ABNORMAL LOW (ref 0.8–2.0)

## 2017-05-16 LAB — MAGNESIUM: Magnesium: 1.8 mg/dL (ref 1.7–2.4)

## 2017-05-16 LAB — TROPONIN I: Troponin I: <0.03 ng/mL (ref <0.03)

## 2017-05-16 MED ORDER — DILTIAZEM HCL-DEXTROSE 100-5 MG/100ML-% IV SOLN (PREMIX): 5.0000 mg/h | INTRAVENOUS | Status: DC

## 2017-05-16 MED ORDER — DILTIAZEM HCL-DEXTROSE 100-5 MG/100ML-% IV SOLN (PREMIX)
5.0000 mg/h | INTRAVENOUS | Status: DC
  Administered 2017-05-16: 5 mg/h via INTRAVENOUS
  Filled 2017-05-16: qty 100

## 2017-05-16 MED ORDER — DILTIAZEM LOAD VIA INFUSION: 20.0000 mg | Freq: Once | INTRAVENOUS | Status: DC

## 2017-05-16 MED ORDER — APIXABAN 5 MG PO TABS: 5.0000 mg | ORAL_TABLET | Freq: Two times a day (BID) | ORAL | 0 refills | Status: DC

## 2017-05-16 MED ORDER — METOPROLOL SUCCINATE ER 50 MG PO TB24
50.0000 mg | ORAL_TABLET | Freq: Two times a day (BID) | ORAL | 0 refills | Status: DC
Start: 2017-05-16 — End: 2017-05-31

## 2017-05-16 MED ORDER — DIGOXIN 125 MCG PO TABS
0.1250 mg | ORAL_TABLET | Freq: Every day | ORAL | 0 refills | Status: DC
Start: 2017-05-16 — End: 2017-05-31

## 2017-05-16 MED ORDER — METOPROLOL SUCCINATE ER 50 MG PO TB24
50.0000 mg | ORAL_TABLET | Freq: Every day | ORAL | Status: DC
Start: 2017-05-16 — End: 2017-05-16
  Filled 2017-05-16: qty 1

## 2017-05-16 MED ORDER — APIXABAN 5 MG PO TABS
5.0000 mg | ORAL_TABLET | Freq: Once | ORAL | Status: AC
  Administered 2017-05-16: 5 mg via ORAL
  Filled 2017-05-16: qty 1

## 2017-05-16 MED ORDER — METFORMIN HCL 500 MG PO TABS: 500.0000 mg | ORAL_TABLET | Freq: Two times a day (BID) | ORAL | 11 refills | Status: DC

## 2017-05-16 MED ORDER — DILTIAZEM HCL 25 MG/5ML IV SOLN
15.0000 mg | Freq: Once | INTRAVENOUS | Status: AC
  Administered 2017-05-16: 15 mg via INTRAVENOUS

## 2017-05-16 MED ORDER — DILTIAZEM HCL ER COATED BEADS 240 MG PO CP24
240.0000 mg | ORAL_CAPSULE | ORAL | Status: AC
  Administered 2017-05-16: 240 mg via ORAL
  Filled 2017-05-16: qty 1

## 2017-05-16 MED ORDER — DIPHENHYDRAMINE HCL 25 MG PO CAPS
25.0000 mg | ORAL_CAPSULE | Freq: Once | ORAL | Status: AC
  Administered 2017-05-16: 25 mg via ORAL
  Filled 2017-05-16: qty 1

## 2017-05-16 MED ORDER — DILTIAZEM LOAD VIA INFUSION
20.0000 mg | Freq: Once | INTRAVENOUS | Status: AC
  Administered 2017-05-16: 20 mg via INTRAVENOUS
  Filled 2017-05-16: qty 20

## 2017-05-16 MED ORDER — DILTIAZEM HCL ER COATED BEADS 240 MG PO CP24: 240.0000 mg | ORAL_CAPSULE | Freq: Every day | ORAL | 0 refills | Status: DC

## 2017-05-16 NOTE — ED Triage Notes (Signed)
Pt BIB [facility] for palpitations for the past week. Pt has been out of some of her medications (Digoxin, Cardizem, Eliqus). Pt states the palpitations have been intermittent until tonight. This episode has lasted for 24 hours and had associated shortness of breath. Rates 190-200. EMS administered 5 mg lopressor and brought it to the 120s-140s

## 2017-05-16 NOTE — Discharge Planning (Signed)
[facility] consulted regarding medication assistance.  Pt utilizes delivery pharmacy and has ran out of cardiac medications.  EDCM spoke with pt at bedside; pt states if she gets home by noon today, she can call pharmacy for refill of medications and they will deliver this evening.  EDCM relayed information to RN.  RN will call [facility] for transportation home as pt states she can not walk long distances.  No further EDCM needs identified.  Derrich Gaby J. Lucretia Roers, RN, BSN, [place] [phone]
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
## 2017-05-30 ENCOUNTER — Emergency Department (HOSPITAL_COMMUNITY): Payer: Medicare Other

## 2017-05-30 ENCOUNTER — Observation Stay (HOSPITAL_COMMUNITY)
Admission: EM | Admit: 2017-05-30 | Discharge: 2017-05-31 | Disposition: A | Discharge status: Home / Self Care | Payer: Medicare Other | Attending: Internal Medicine | Admitting: Internal Medicine

## 2017-05-30 ENCOUNTER — Encounter (HOSPITAL_COMMUNITY): Payer: Self-pay | Admitting: Emergency Medicine

## 2017-05-30 DIAGNOSIS — I4891 Unspecified atrial fibrillation: Secondary | ICD-10-CM | POA: Diagnosis present

## 2017-05-30 DIAGNOSIS — R197 Diarrhea, unspecified: Secondary | ICD-10-CM | POA: Diagnosis not present

## 2017-05-30 DIAGNOSIS — I9589 Other hypotension: Secondary | ICD-10-CM | POA: Diagnosis not present

## 2017-05-30 DIAGNOSIS — E1165 Type 2 diabetes mellitus with hyperglycemia: Secondary | ICD-10-CM | POA: Diagnosis not present

## 2017-05-30 DIAGNOSIS — Z7982 Long term (current) use of aspirin: Secondary | ICD-10-CM | POA: Insufficient documentation

## 2017-05-30 DIAGNOSIS — E1122 Type 2 diabetes mellitus with diabetic chronic kidney disease: Secondary | ICD-10-CM | POA: Insufficient documentation

## 2017-05-30 DIAGNOSIS — Z7901 Long term (current) use of anticoagulants: Secondary | ICD-10-CM | POA: Diagnosis not present

## 2017-05-30 DIAGNOSIS — I252 Old myocardial infarction: Secondary | ICD-10-CM | POA: Diagnosis not present

## 2017-05-30 DIAGNOSIS — E860 Dehydration: Secondary | ICD-10-CM | POA: Diagnosis not present

## 2017-05-30 DIAGNOSIS — I481 Persistent atrial fibrillation: Principal | ICD-10-CM | POA: Insufficient documentation

## 2017-05-30 DIAGNOSIS — N189 Chronic kidney disease, unspecified: Secondary | ICD-10-CM | POA: Diagnosis not present

## 2017-05-30 DIAGNOSIS — I130 Hypertensive heart and chronic kidney disease with heart failure and stage 1 through stage 4 chronic kidney disease, or unspecified chronic kidney disease: Secondary | ICD-10-CM | POA: Diagnosis not present

## 2017-05-30 DIAGNOSIS — I5042 Chronic combined systolic (congestive) and diastolic (congestive) heart failure: Secondary | ICD-10-CM | POA: Diagnosis not present

## 2017-05-30 DIAGNOSIS — Z880 Allergy status to penicillin: Secondary | ICD-10-CM | POA: Insufficient documentation

## 2017-05-30 DIAGNOSIS — E861 Hypovolemia: Secondary | ICD-10-CM | POA: Diagnosis not present

## 2017-05-30 DIAGNOSIS — N179 Acute kidney failure, unspecified: Secondary | ICD-10-CM | POA: Diagnosis not present

## 2017-05-30 DIAGNOSIS — R7989 Other specified abnormal findings of blood chemistry: Secondary | ICD-10-CM

## 2017-05-30 DIAGNOSIS — Z7984 Long term (current) use of oral hypoglycemic drugs: Secondary | ICD-10-CM | POA: Insufficient documentation

## 2017-05-30 DIAGNOSIS — I5022 Chronic systolic (congestive) heart failure: Secondary | ICD-10-CM | POA: Insufficient documentation

## 2017-05-30 DIAGNOSIS — Z9114 Patient's other noncompliance with medication regimen: Secondary | ICD-10-CM | POA: Diagnosis not present

## 2017-05-30 DIAGNOSIS — I255 Ischemic cardiomyopathy: Secondary | ICD-10-CM | POA: Diagnosis present

## 2017-05-30 DIAGNOSIS — I4892 Unspecified atrial flutter: Secondary | ICD-10-CM | POA: Insufficient documentation

## 2017-05-30 DIAGNOSIS — E119 Type 2 diabetes mellitus without complications: Secondary | ICD-10-CM | POA: Diagnosis present

## 2017-05-30 DIAGNOSIS — Z888 Allergy status to other drugs, medicaments and biological substances status: Secondary | ICD-10-CM | POA: Insufficient documentation

## 2017-05-30 DIAGNOSIS — Z8669 Personal history of other diseases of the nervous system and sense organs: Secondary | ICD-10-CM | POA: Insufficient documentation

## 2017-05-30 DIAGNOSIS — Z6841 Body Mass Index (BMI) 40.0 and over, adult: Secondary | ICD-10-CM | POA: Insufficient documentation

## 2017-05-30 DIAGNOSIS — Z91148 Patient's other noncompliance with medication regimen for other reason: Secondary | ICD-10-CM

## 2017-05-30 LAB — CBC WITH DIFFERENTIAL/PLATELET
Basophils Absolute: 0.1 10*3/uL (ref 0.0–0.1)
Basophils Absolute: 0 10*3/uL (ref 0.0–0.1)
Basophils Relative: 1 %
Basophils Relative: 0 %
Eosinophils Absolute: 0.4 10*3/uL (ref 0.0–0.7)
Eosinophils Absolute: 0.2 10*3/uL (ref 0.0–0.7)
Eosinophils Relative: 3 %
Eosinophils Relative: 1 %
HCT: 44.5 % (ref 36.0–46.0)
HCT: 36.9 % (ref 36.0–46.0)
Hemoglobin: 14.2 g/dL (ref 12.0–15.0)
Hemoglobin: 11.6 g/dL — ABNORMAL LOW (ref 12.0–15.0)
Lymphocytes Relative: 15 %
Lymphocytes Relative: 8 %
Lymphs Abs: 2.2 10*3/uL (ref 0.7–4.0)
Lymphs Abs: 1.4 10*3/uL (ref 0.7–4.0)
MCH: 36.1 pg — ABNORMAL HIGH (ref 26.0–34.0)
MCH: 35.9 pg — ABNORMAL HIGH (ref 26.0–34.0)
MCHC: 31.9 g/dL (ref 30.0–36.0)
MCHC: 31.4 g/dL (ref 30.0–36.0)
MCV: 113.2 fL — ABNORMAL HIGH (ref 78.0–100.0)
MCV: 114.2 fL — ABNORMAL HIGH (ref 78.0–100.0)
Monocytes Absolute: 0.6 10*3/uL (ref 0.1–1.0)
Monocytes Absolute: 0.5 10*3/uL (ref 0.1–1.0)
Monocytes Relative: 4 %
Monocytes Relative: 3 %
Neutro Abs: 11.3 10*3/uL — ABNORMAL HIGH (ref 1.7–7.7)
Neutro Abs: 15.4 10*3/uL — ABNORMAL HIGH (ref 1.7–7.7)
Neutrophils Relative %: 77 %
Neutrophils Relative %: 88 %
Platelets: 298 10*3/uL (ref 150–400)
Platelets: 218 10*3/uL (ref 150–400)
RBC: 3.93 MIL/uL (ref 3.87–5.11)
RBC: 3.23 MIL/uL — ABNORMAL LOW (ref 3.87–5.11)
RDW: 16.1 % — ABNORMAL HIGH (ref 11.5–15.5)
RDW: 16.4 % — ABNORMAL HIGH (ref 11.5–15.5)
WBC: 14.6 10*3/uL — ABNORMAL HIGH (ref 4.0–10.5)
WBC: 17.5 10*3/uL — ABNORMAL HIGH (ref 4.0–10.5)

## 2017-05-30 LAB — COMPREHENSIVE METABOLIC PANEL
ALT: 12 U/L — ABNORMAL LOW (ref 14–54)
ALT: 11 U/L — ABNORMAL LOW (ref 14–54)
AST: 18 U/L (ref 15–41)
AST: 16 U/L (ref 15–41)
Albumin: 3.4 g/dL — ABNORMAL LOW (ref 3.5–5.0)
Albumin: 3.1 g/dL — ABNORMAL LOW (ref 3.5–5.0)
Alkaline Phosphatase: 75 U/L (ref 38–126)
Alkaline Phosphatase: 67 U/L (ref 38–126)
Anion gap: 8 (ref 5–15)
Anion gap: 9 (ref 5–15)
BUN: 10 mg/dL (ref 6–20)
BUN: 11 mg/dL (ref 6–20)
CO2: 21 mmol/L — ABNORMAL LOW (ref 22–32)
CO2: 22 mmol/L (ref 22–32)
Calcium: 8.1 mg/dL — ABNORMAL LOW (ref 8.9–10.3)
Calcium: 7.9 mg/dL — ABNORMAL LOW (ref 8.9–10.3)
Chloride: 112 mmol/L — ABNORMAL HIGH (ref 101–111)
Chloride: 110 mmol/L (ref 101–111)
Creatinine, Ser: 1.07 mg/dL — ABNORMAL HIGH (ref 0.44–1.00)
Creatinine, Ser: 1.1 mg/dL — ABNORMAL HIGH (ref 0.44–1.00)
GFR calc Af Amer: >60 mL/min (ref >60)
GFR calc Af Amer: 59 mL/min — ABNORMAL LOW (ref >60)
GFR calc non Af Amer: 52 mL/min — ABNORMAL LOW (ref >60)
GFR calc non Af Amer: 51 mL/min — ABNORMAL LOW (ref >60)
Glucose, Bld: 238 mg/dL — ABNORMAL HIGH (ref 65–99)
Glucose, Bld: 172 mg/dL — ABNORMAL HIGH (ref 65–99)
Potassium: 3.4 mmol/L — ABNORMAL LOW (ref 3.5–5.1)
Potassium: 3.8 mmol/L (ref 3.5–5.1)
Sodium: 141 mmol/L (ref 135–145)
Sodium: 141 mmol/L (ref 135–145)
Total Bilirubin: 1.2 mg/dL (ref 0.3–1.2)
Total Bilirubin: 0.8 mg/dL (ref 0.3–1.2)
Total Protein: 6.5 g/dL (ref 6.5–8.1)
Total Protein: 6.1 g/dL — ABNORMAL LOW (ref 6.5–8.1)

## 2017-05-30 LAB — GLUCOSE, CAPILLARY
Glucose-Capillary: 131 mg/dL — ABNORMAL HIGH (ref 65–99)
Glucose-Capillary: 167 mg/dL — ABNORMAL HIGH (ref 65–99)

## 2017-05-30 LAB — PROTIME-INR
INR: 1.13
Prothrombin Time: 14.4 seconds (ref 11.4–15.2)

## 2017-05-30 LAB — I-STAT CG4 LACTIC ACID, ED
Lactic Acid, Venous: 2.91 mmol/L (ref 0.5–1.9)
Lactic Acid, Venous: 2.07 mmol/L (ref 0.5–1.9)

## 2017-05-30 LAB — TSH: TSH: 2.826 u[IU]/mL (ref 0.350–4.500)

## 2017-05-30 LAB — I-STAT TROPONIN, ED: Troponin i, poc: 0 ng/mL (ref 0.00–0.08)

## 2017-05-30 LAB — CBG MONITORING, ED
Glucose-Capillary: 177 mg/dL — ABNORMAL HIGH (ref 65–99)
Glucose-Capillary: 128 mg/dL — ABNORMAL HIGH (ref 65–99)

## 2017-05-30 LAB — LACTIC ACID, PLASMA: Lactic Acid, Venous: 1.6 mmol/L (ref 0.5–1.9)

## 2017-05-30 LAB — TROPONIN I: Troponin I: <0.03 ng/mL (ref <0.03)

## 2017-05-30 LAB — MAGNESIUM: Magnesium: 1.8 mg/dL (ref 1.7–2.4)

## 2017-05-30 LAB — DIGOXIN LEVEL: Digoxin Level: <0.2 ng/mL — ABNORMAL LOW (ref 0.8–2.0)

## 2017-05-30 MED ORDER — LOPERAMIDE HCL 2 MG PO CAPS
4.0000 mg | ORAL_CAPSULE | Freq: Once | ORAL | Status: AC
  Administered 2017-05-30: 4 mg via ORAL
  Filled 2017-05-30: qty 2

## 2017-05-30 MED ORDER — APIXABAN 5 MG PO TABS
5.0000 mg | ORAL_TABLET | Freq: Two times a day (BID) | ORAL | Status: DC
  Administered 2017-05-30 – 2017-05-31 (×3): 5 mg via ORAL
  Filled 2017-05-30 (×3): qty 1

## 2017-05-30 MED ORDER — SODIUM CHLORIDE 0.9 % IV BOLUS (SEPSIS)
1000.0000 mL | Freq: Once | INTRAVENOUS | Status: AC
  Administered 2017-05-30: 1000 mL via INTRAVENOUS

## 2017-05-30 MED ORDER — ACETAMINOPHEN 650 MG RE SUPP: 650.0000 mg | Freq: Four times a day (QID) | RECTAL | Status: DC | PRN

## 2017-05-30 MED ORDER — POTASSIUM CHLORIDE CRYS ER 20 MEQ PO TBCR
40.0000 meq | EXTENDED_RELEASE_TABLET | Freq: Once | ORAL | Status: AC
  Administered 2017-05-30: 40 meq via ORAL
  Filled 2017-05-30: qty 2

## 2017-05-30 MED ORDER — ACETAMINOPHEN 325 MG PO TABS: 650.0000 mg | ORAL_TABLET | Freq: Four times a day (QID) | ORAL | Status: DC | PRN

## 2017-05-30 MED ORDER — DILTIAZEM HCL ER COATED BEADS 120 MG PO CP24: 240.0000 mg | ORAL_CAPSULE | Freq: Every day | ORAL | Status: DC

## 2017-05-30 MED ORDER — ONDANSETRON HCL 4 MG PO TABS: 4.0000 mg | ORAL_TABLET | Freq: Four times a day (QID) | ORAL | Status: DC | PRN

## 2017-05-30 MED ORDER — DIGOXIN 125 MCG PO TABS
0.1250 mg | ORAL_TABLET | Freq: Every day | ORAL | Status: DC
  Administered 2017-05-30 – 2017-05-31 (×2): 0.125 mg via ORAL
  Filled 2017-05-30 (×2): qty 1

## 2017-05-30 MED ORDER — ASPIRIN 325 MG PO TABS: 325.0000 mg | ORAL_TABLET | Freq: Four times a day (QID) | ORAL | Status: DC | PRN

## 2017-05-30 MED ORDER — LOSARTAN POTASSIUM 25 MG PO TABS: 25.0000 mg | ORAL_TABLET | Freq: Every day | ORAL | Status: DC

## 2017-05-30 MED ORDER — INSULIN ASPART 100 UNIT/ML ~~LOC~~ SOLN
0.0000 [IU] | Freq: Three times a day (TID) | SUBCUTANEOUS | Status: DC
  Administered 2017-05-30: 2 [IU] via SUBCUTANEOUS
  Administered 2017-05-30 – 2017-05-31 (×3): 1 [IU] via SUBCUTANEOUS
  Filled 2017-05-30 (×2): qty 1

## 2017-05-30 MED ORDER — ONDANSETRON HCL 4 MG/2ML IJ SOLN: 4.0000 mg | Freq: Four times a day (QID) | INTRAMUSCULAR | Status: DC | PRN

## 2017-05-30 MED ORDER — METOPROLOL SUCCINATE ER 50 MG PO TB24
50.0000 mg | ORAL_TABLET | Freq: Two times a day (BID) | ORAL | Status: DC
  Administered 2017-05-30 – 2017-05-31 (×3): 50 mg via ORAL
  Filled 2017-05-30 (×3): qty 1
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
## 2017-05-30 NOTE — ED Triage Notes (Signed)
BIB EMS from home, pt reports diarrhea X1 day, also reports onset SOB 2330. Pt noted to be in afib HR 160's-180's en route, hypotensive. Pt pale and diaphoretic. Seen here recently for afib RVR d/t non compliance with meds
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
## 2017-05-30 NOTE — ED Notes (Signed)
Checked patient blood sugar it was 128 notifed RN of blood sugar

## 2017-05-30 NOTE — ED Notes (Signed)
Got patient hooked up to the monitor patient is resting with call bell in reach 
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
## 2017-05-30 NOTE — Progress Notes (Signed)
Patient seen and examined the bedside this morning. Patient was admitted overnight for A. fib with RVR. Her heart rate is already on controlled now.  She also presented with some diarrhea which is stopped .Blood pressure stable. She has history of an NICM, A. fib, hypertension, diabetes. Cardiology has been following. She underwent echocardiogram which shows ejection fraction of 30-35%.  On comparison to the last echocardiogram done in 5/16, ejection fraction is improved. Echo report: Systolic function was   moderately to severely reduced. The estimated ejection fraction   was in the range of 30% to 35%. Wall motion was normal; there   were no regional wall motion abnormalities Cardizem discontinued.  Started on losartan 25 mg daily. We will continue to monitor the patient.  Plan is to discharge to home tomorrow if her heart rate remains stable.
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
## 2017-05-30 NOTE — ED Notes (Signed)
Pt cbg, 177.

## 2017-05-31 DIAGNOSIS — I5022 Chronic systolic (congestive) heart failure: Secondary | ICD-10-CM | POA: Diagnosis not present

## 2017-05-31 DIAGNOSIS — I4891 Unspecified atrial fibrillation: Secondary | ICD-10-CM | POA: Diagnosis not present

## 2017-05-31 LAB — CBC WITH DIFFERENTIAL/PLATELET
Basophils Absolute: 0.1 10*3/uL (ref 0.0–0.1)
Basophils Relative: 1 %
Eosinophils Absolute: 0.5 10*3/uL (ref 0.0–0.7)
Eosinophils Relative: 6 %
HCT: 33.8 % — ABNORMAL LOW (ref 36.0–46.0)
Hemoglobin: 10.5 g/dL — ABNORMAL LOW (ref 12.0–15.0)
Lymphocytes Relative: 21 %
Lymphs Abs: 1.8 10*3/uL (ref 0.7–4.0)
MCH: 36 pg — ABNORMAL HIGH (ref 26.0–34.0)
MCHC: 31.1 g/dL (ref 30.0–36.0)
MCV: 115.8 fL — ABNORMAL HIGH (ref 78.0–100.0)
Monocytes Absolute: 0.4 10*3/uL (ref 0.1–1.0)
Monocytes Relative: 5 %
Neutro Abs: 6 10*3/uL (ref 1.7–7.7)
Neutrophils Relative %: 67 %
Platelets: 201 10*3/uL (ref 150–400)
RBC: 2.92 MIL/uL — ABNORMAL LOW (ref 3.87–5.11)
RDW: 17 % — ABNORMAL HIGH (ref 11.5–15.5)
WBC: 8.8 10*3/uL (ref 4.0–10.5)

## 2017-05-31 LAB — BASIC METABOLIC PANEL
Anion gap: 9 (ref 5–15)
BUN: 16 mg/dL (ref 6–20)
CO2: 22 mmol/L (ref 22–32)
Calcium: 8.4 mg/dL — ABNORMAL LOW (ref 8.9–10.3)
Chloride: 106 mmol/L (ref 101–111)
Creatinine, Ser: 1.38 mg/dL — ABNORMAL HIGH (ref 0.44–1.00)
GFR calc Af Amer: 45 mL/min — ABNORMAL LOW (ref >60)
GFR calc non Af Amer: 39 mL/min — ABNORMAL LOW (ref >60)
Glucose, Bld: 149 mg/dL — ABNORMAL HIGH (ref 65–99)
Potassium: 4 mmol/L (ref 3.5–5.1)
Sodium: 137 mmol/L (ref 135–145)

## 2017-05-31 LAB — GLUCOSE, CAPILLARY
Glucose-Capillary: 139 mg/dL — ABNORMAL HIGH (ref 65–99)
Glucose-Capillary: 134 mg/dL — ABNORMAL HIGH (ref 65–99)

## 2017-05-31 LAB — DIGOXIN LEVEL: Digoxin Level: <0.2 ng/mL — ABNORMAL LOW (ref 0.8–2.0)

## 2017-05-31 MED ORDER — DIGOXIN 125 MCG PO TABS: 0.1250 mg | ORAL_TABLET | Freq: Every day | ORAL | 2 refills | Status: DC

## 2017-05-31 MED ORDER — METOPROLOL SUCCINATE ER 50 MG PO TB24: 50.0000 mg | ORAL_TABLET | Freq: Two times a day (BID) | ORAL | 2 refills | Status: DC

## 2017-05-31 MED ORDER — LOSARTAN POTASSIUM 25 MG PO TABS
25.0000 mg | ORAL_TABLET | Freq: Every day | ORAL | Status: DC
  Filled 2017-05-31: qty 1

## 2017-05-31 MED ORDER — LOSARTAN POTASSIUM 25 MG PO TABS: 25.0000 mg | ORAL_TABLET | Freq: Every day | ORAL | 2 refills | Status: DC

## 2017-05-31 MED ORDER — SODIUM CHLORIDE 0.9 % IV SOLN
INTRAVENOUS | Status: DC
  Administered 2017-05-31: 08:00:00 via INTRAVENOUS

## 2017-05-31 MED ORDER — METFORMIN HCL 500 MG PO TABS: 500.0000 mg | ORAL_TABLET | Freq: Two times a day (BID) | ORAL | 2 refills | Status: DC

## 2017-05-31 MED ORDER — APIXABAN 5 MG PO TABS: 5.0000 mg | ORAL_TABLET | Freq: Two times a day (BID) | ORAL | 2 refills | Status: DC

## 2017-05-31 MED FILL — DIGOXIN 0.125 MG TABLET: 125 | 90 days supply | Qty: 90 | Fill #0

## 2017-05-31 MED FILL — ELIQUIS 5 MG TABLET: 5 | 90 days supply | Qty: 180 | Fill #0

## 2017-05-31 MED FILL — LOSARTAN POTASSIUM 25 MG TA: 25 | 90 days supply | Qty: 90 | Fill #0

## 2017-05-31 NOTE — Progress Notes (Signed)
Advanced Heart Failure Rounding Note  PCP:  Primary Cardiologist: Dr Shirlee Latch   Subjective:     Feeling better. Wants to go home. Wants 90 day supply of medications.    Objective:   Weight Range: (!) 303 lb 9.6 oz (137.7 kg) Body mass index is 42.34 kg/m.   Vital Signs:   Temp:  [97.2 F (36.2 C)-98.2 F (36.8 C)] 97.7 F (36.5 C) (01/15 0318) Pulse Rate:  [58-99] 94 (01/15 0318) Resp:  [13-20] 18 (01/15 0318) BP: (105-142)/(57-88) 142/88 (01/15 0318) SpO2:  [91 %-100 %] 94 % (01/15 0318) Weight:  [303 lb 4.8 oz (137.6 kg)-303 lb 9.6 oz (137.7 kg)] 303 lb 9.6 oz (137.7 kg) (01/15 0318) Last BM Date: 05/30/17  Weight change: Filed Weights   05/30/17 0145 05/30/17 1541 05/31/17 0318  Weight: 290 lb (131.5 kg) (!) 303 lb 4.8 oz (137.6 kg) (!) 303 lb 9.6 oz (137.7 kg)    Intake/Output:   Intake/Output Summary (Last 24 hours) at 05/31/2017 9024 Last data filed at 05/30/2017 2348 Gross per 24 hour  Intake -  Output 200 ml  Net -200 ml      Physical Exam    General:  Appears chronically ill. No resp difficulty HEENT: Normal Neck: Supple. JVP 6-7. Carotids 2+ bilat; no bruits. No lymphadenopathy or thyromegaly appreciated. Cor: PMI nondisplaced. Irregular rate & rhythm. No rubs, gallops or murmurs. Lungs: Clear Abdomen: obese, Soft, nontender, nondistended. No hepatosplenomegaly. No bruits or masses. Good bowel sounds. Extremities: No cyanosis, clubbing, rash, edema Neuro: Alert & orientedx3, cranial nerves grossly intact. moves all 4 extremities w/o difficulty. Affect pleasant   Telemetry   A fib 90s personally reviewed.   EKG    N/A  Labs    CBC Recent Labs    05/30/17 0140 05/30/17 0820  WBC 14.6* 17.5*  NEUTROABS 11.3* 15.4*  HGB 14.2 11.6*  HCT 44.5 36.9  MCV 113.2* 114.2*  PLT 298 218   Basic Metabolic Panel Recent Labs    09/73/53 0820 05/31/17 0629  NA 141 137  K 3.8 4.0  CL 110 106  CO2 22 22  GLUCOSE 172* 149*  BUN 11 16    CREATININE 1.10* 1.38*  CALCIUM 7.9* 8.4*  MG 1.8  --    Liver Function Tests Recent Labs    05/30/17 0140 05/30/17 0820  AST 18 16  ALT 12* 11*  ALKPHOS 75 67  BILITOT 1.2 0.8  PROT 6.5 6.1*  ALBUMIN 3.4* 3.1*   No results for input(s): LIPASE, AMYLASE in the last 72 hours. Cardiac Enzymes Recent Labs    05/30/17 0820  TROPONINI <0.03    BNP: BNP (last 3 results) Recent Labs    07/25/16 1545 01/07/17 1813  BNP 209.8* 205.5*    ProBNP (last 3 results) No results for input(s): PROBNP in the last 8760 hours.   D-Dimer No results for input(s): DDIMER in the last 72 hours. Hemoglobin A1C No results for input(s): HGBA1C in the last 72 hours. Fasting Lipid Panel No results for input(s): CHOL, HDL, LDLCALC, TRIG, CHOLHDL, LDLDIRECT in the last 72 hours. Thyroid Function Tests Recent Labs    05/30/17 0820  TSH 2.826    Other results:   Imaging     No results found.   Medications:     Scheduled Medications: . apixaban  5 mg Oral BID  . digoxin  0.125 mg Oral Daily  . insulin aspart  0-9 Units Subcutaneous TID WC  . losartan  25  mg Oral Daily  . metoprolol succinate  50 mg Oral BID     Infusions: . sodium chloride       PRN Medications:  acetaminophen **OR** acetaminophen, ondansetron **OR** ondansetron (ZOFRAN) IV    Patient Profile    Kaitlyn [name] is a 67 y.o. female with permanent afib, Chronic systolic CHF (refuses ischemic work up), HTN, DM2, and non-compliance    Assessment/Plan  1. Permanent Afib -> Now with RVR -Rate controlled.  - Continue Toprol 50 mg BID.  - off diltiazem with low EF.  - Continue digoxin 0.125 mg daily -Continue eiquis 5 mg BID.  - This patients CHA2DS2-VASc Score and unadjusted Ischemic Stroke Rate (% per year) is at least 5.  2. Chronic systolic CHF - EF 30-35% 07/2016. Has refused ischemic work up.  - Volume status stable.  - Continue losartan 25 mg daily - Continue Toprol 50 mg BID as  above. - Continue digoxin as above. Dig level < 0.2  3. HTN  - Meds as above  - Hypotensive on admission, resolved.   4. DM2 - Per primary.  - Consider Jardiance 5. AKI -creatinine 1.1>1.38  receiving IV fluid per primary team. Would limit how much with low EF.   Medication concerns reviewed with patient and pharmacy team. Barriers identified: Social limitations. Ongoing noncompliance. Refuses outpatient follow up. Does not want assistance with follow up.   Length of Stay: 0  [name], NP  05/31/2017, 8:08 AM  Advanced Heart Failure Team Pager [phone] (M-F; 7a - 4p)  Please contact [facility] for night-coverage after hours (4p -7a ) and weekends on [email or web address]  Patient seen with NP, agree with the above note.    She feels much better today.  HR controlled.  No dyspnea.  No further diarrhea.  Ate breakfast.  She is not volume overloaded on exam.   Think we can stop IVF today and let her go home.  She has not been taking Lasix since 8/18, seems to be doing ok in terms of volume without Lasix.   Would discharge her on Eliquis 5 mg bid, Toprol XL 50 mg bid, losartan 25 mg daily, digoxin 0.125 daily.  Would not give her diltiazem CD with low EF.   Compliance will continue to be an issue, she is not going to come to any outpatient appointments. Would try to get her a 3 month supply of meds.   [name] 05/31/2017 11:05 AM

## 2017-05-31 NOTE — Progress Notes (Signed)
Patient is to be discharged home via ambulance; home address verified. PTAR non emergent called as requested. Abelino Derrick [facility] [phone]

## 2017-05-31 NOTE — Discharge Summary (Signed)
Physician Discharge Summary  Kaitlyn [name] UJW:[id number] DOB: Sep 16, 1950 DOA: 05/30/2017  PCP: Laurey Morale, MD  Admit date: 05/30/2017 Discharge date: 05/31/2017  Admitted From: Home Disposition:  Home  Discharge Condition: Stable CODE STATUS:Full Diet recommendation: Heart Healthy   Brief/Interim Summary:  H and P:  Kaitlyn [name] is a 67 y.o. female with history of atrial fibrillation, systolic heart failure, diabetes mellitus, hypertension started experiencing multiple episodes of diarrhea last evening 2 hours after eating corn and crackers.  Denies any abdominal pain or diarrhea.  After a few episodes of diarrhea patient started feeling weak and started having palpitations.  At this point patient started to take another dose of Cardizem which she usually takes only once a day.  She also took her night dose of metoprolol.  Despite which she was still having palpitation week she called EMS and was brought to the ER.  Hospital Course: Patient was admitted for the management of A. fib with RVR.  Her heart rate was controlled after starting on Cardizem drip.  Cardiology evaluated the patient. Her hospital course remained stable. As per the  Echocardiogram on 3/18 ,it showed  ejection fraction of 30-35% .Patient is stable for discharge to home today.  She will should follow-up with cardiology as an outpatient.  Following problems were addressed during hospitalization:  1. A. fib with RVR -likely precipitated by patient's dehydration status.  Cardiology monitor the patient.  His echocardiogram showed improved ejection fraction from last time.  She will continue metoprolol on discharge.  She will also continue on Eliquis,Digoxin. 2. Diarrhea -  Patient's diarrhea improved with loperamide.  Repeat lactic acid level is normal. 3. History of CHF :EF is  30-35% as per the last echocardiogram  .Patient has refused any further workup on this previously.   She is extremely noncompliant.Diltiazem  discontinued due to reduced ejection fraction. 4. History of hypertension: Currently blood pressure stable 5.   History of diabetes mellitus type 2 :Last hemoglobin A1c in August was around 7.5.              Will resume her home medication. 6.    CKD: Her baseline creatinine ranges from 1.2-1.3.  Creatinine slightly elevated at 1.38              today.  Will recommend to check kidney function in a week by doing a BMP test      Discharge Diagnoses:  Principal Problem:   Atrial fibrillation with RVR (HCC) Active Problems:   Diabetes type 2, uncontrolled (HCC)   Noncompliance with medication regimen   Hypotension due to hypovolemia   Diarrhea   Ischemic cardiomyopathy    Discharge Instructions  Discharge Instructions    Diet - low sodium heart healthy   Complete by:  As directed    Discharge instructions   Complete by:  As directed    1) Follow up with your PCP in a week. 2) Follow up with cardiology in 2 weeks. 3) Check kidney function by doing a BMP testing a week.   Increase activity slowly   Complete by:  As directed      Allergies as of 05/31/2017      Reactions   Caffeine Other (See Comments)   Seizure from large doses, heart races from small doses   Cranberry Shortness Of Breath, Diarrhea, Itching, Other (See Comments)   Severe headache.."Ocean Spray cranberry juice"   Lisinopril Diarrhea, Itching, Swelling   Penicillins Other (See Comments)   Unknown childhood allergy Has  patient had a PCN reaction causing immediate rash, facial/tongue/throat swelling, SOB or lightheadedness with hypotension: unknown Has patient had a PCN reaction causing severe rash involving mucus membranes or skin necrosis: unknown Has patient had a PCN reaction that required hospitalization unknown Has patient had a PCN reaction occurring within the last 10 years: unknown If all of the above answers are "NO", then may proceed with Cephalosporin use.   Other Other (See Comments)    Stimulants Pt has a past hx of seizures      Medication List    STOP taking these medications   aspirin 325 MG tablet   diltiazem 240 MG 24 hr capsule Commonly known as:  CARDIZEM CD     TAKE these medications   apixaban 5 MG Tabs tablet Commonly known as:  ELIQUIS Take 1 tablet (5 mg total) by mouth 2 (two) times daily.   digoxin 0.125 MG tablet Commonly known as:  LANOXIN Take 1 tablet (0.125 mg total) by mouth daily.   losartan 25 MG tablet Commonly known as:  COZAAR Take 1 tablet (25 mg total) by mouth daily. Start taking on:  06/01/2017   metFORMIN 500 MG tablet Commonly known as:  GLUCOPHAGE Take 1 tablet (500 mg total) by mouth 2 (two) times daily with a meal.   metoprolol succinate 50 MG 24 hr tablet Commonly known as:  TOPROL-XL Take 1 tablet (50 mg total) by mouth 2 (two) times daily. Take with or immediately following a meal.      Follow-up Information    Laurey Morale, MD Follow up.   Specialty:  Cardiology Contact information: [address] [place] [address] [phone]          Allergies  Allergen Reactions  . Caffeine Other (See Comments)    Seizure from large doses, heart races from small doses  . Cranberry Shortness Of Breath, Diarrhea, Itching and Other (See Comments)    Severe headache.."Ocean Spray cranberry juice"  . Lisinopril Diarrhea, Itching and Swelling  . Penicillins Other (See Comments)    Unknown childhood allergy Has patient had a PCN reaction causing immediate rash, facial/tongue/throat swelling, SOB or lightheadedness with hypotension: unknown Has patient had a PCN reaction causing severe rash involving mucus membranes or skin necrosis: unknown Has patient had a PCN reaction that required hospitalization unknown Has patient had a PCN reaction occurring within the last 10 years: unknown If all of the above answers are "NO", then may proceed with Cephalosporin use.   . Other Other (See Comments)     Stimulants Pt has a past hx of seizures    Consultations: Cardiology  Procedures/Studies: Dg Chest Portable 1 View  Result Date: 05/30/2017 CLINICAL DATA:  67 year old female with shortness of breath. EXAM: PORTABLE CHEST 1 VIEW COMPARISON:  Chest radiograph dated 05/16/2017 FINDINGS: The lungs are clear. There is no pleural effusion or pneumothorax. Stable mild cardiomegaly. There is prominence the hilar vasculature suggestive of underlying pulmonary hypertension. Osteopenia. No acute osseous pathology. IMPRESSION: No active disease. Electronically Signed   By: Elgie Collard M.D.   On: 05/30/2017 02:05   Dg Chest Portable 1 View  Result Date: 05/16/2017 CLINICAL DATA:  Acute onset of palpitations.  Atrial fibrillation. EXAM: PORTABLE CHEST 1 VIEW COMPARISON:  Chest radiograph performed 01/07/2017 FINDINGS: The lungs are well-aerated. Vascular congestion is noted. Mildly increased interstitial markings may reflect mild interstitial edema. There is no evidence of pleural effusion or pneumothorax. The cardiomediastinal silhouette is mildly enlarged. No acute osseous  abnormalities are seen. IMPRESSION: Vascular congestion and mild cardiomegaly. Mildly increased interstitial markings may reflect mild interstitial edema. Electronically Signed   By: Roanna Raider M.D.   On: 05/16/2017 03:16    (Echo, Carotid, EGD, Colonoscopy, ERCP)    Subjective: Patient seen and examined the patient this morning.  Remains comfortable.  Heart rate stable.  Diarrhea stopped.  Discharge plan discussed with the patient..\  Discharge Exam: Vitals:   05/31/17 0830 05/31/17 1156  BP: 127/69 135/70  Pulse: 90 (!) 101  Resp:  18  Temp:  98.5 F (36.9 C)  SpO2:  93%   Vitals:   05/30/17 2347 05/31/17 0318 05/31/17 0830 05/31/17 1156  BP: 135/75 (!) 142/88 127/69 135/70  Pulse: 79 94 90 (!) 101  Resp: 18 18  18   Temp: (!) 97.2 F (36.2 C) 97.7 F (36.5 C)  98.5 F (36.9 C)  TempSrc: Oral Oral  Oral   SpO2: 94% 94%  93%  Weight:  (!) 137.7 kg (303 lb 9.6 oz)    Height:        General: Pt is alert, awake, not in acute distress Cardiovascular: RRR, S1/S2 +, no rubs, no gallops Respiratory: CTA bilaterally, no wheezing, no rhonchi Abdominal: Soft, NT, ND, bowel sounds + Extremities: no edema, no cyanosis    The results of significant diagnostics from this hospitalization (including imaging, microbiology, ancillary and laboratory) are listed below for reference.     Microbiology: Recent Results (from the past 240 hour(s))  Culture, blood (Routine x 2)     Status: None (Preliminary result)   Collection Time: 05/30/17  1:40 AM  Result Value Ref Range Status   Specimen Description BLOOD RIGHT HAND  Final   Special Requests   Final    BOTTLES DRAWN AEROBIC AND ANAEROBIC Blood Culture adequate volume   Culture NO GROWTH 1 DAY  Final   Report Status PENDING  Incomplete  Culture, blood (Routine x 2)     Status: None (Preliminary result)   Collection Time: 05/30/17  1:58 AM  Result Value Ref Range Status   Specimen Description BLOOD RIGHT FOREARM  Final   Special Requests   Final    BOTTLES DRAWN AEROBIC AND ANAEROBIC Blood Culture adequate volume   Culture NO GROWTH 1 DAY  Final   Report Status PENDING  Incomplete     Labs: BNP (last 3 results) Recent Labs    07/25/16 1545 01/07/17 1813  BNP 209.8* 205.5*   Basic Metabolic Panel: Recent Labs  Lab 05/30/17 0140 05/30/17 0820 05/31/17 0629  NA 141 141 137  K 3.4* 3.8 4.0  CL 112* 110 106  CO2 21* 22 22  GLUCOSE 238* 172* 149*  BUN 10 11 16   CREATININE 1.07* 1.10* 1.38*  CALCIUM 8.1* 7.9* 8.4*  MG  --  1.8  --    Liver Function Tests: Recent Labs  Lab 05/30/17 0140 05/30/17 0820  AST 18 16  ALT 12* 11*  ALKPHOS 75 67  BILITOT 1.2 0.8  PROT 6.5 6.1*  ALBUMIN 3.4* 3.1*   No results for input(s): LIPASE, AMYLASE in the last 168 hours. No results for input(s): AMMONIA in the last 168 hours. CBC: Recent  Labs  Lab 05/30/17 0140 05/30/17 0820 05/31/17 0629  WBC 14.6* 17.5* 8.8  NEUTROABS 11.3* 15.4* 6.0  HGB 14.2 11.6* 10.5*  HCT 44.5 36.9 33.8*  MCV 113.2* 114.2* 115.8*  PLT 298 218 201   Cardiac Enzymes: Recent Labs  Lab 05/30/17 0820  TROPONINI <  0.03   BNP: Invalid input(s): POCBNP CBG: Recent Labs  Lab 05/30/17 1137 05/30/17 1636 05/30/17 2104 05/31/17 0746 05/31/17 1124  GLUCAP 128* 131* 167* 139* 134*   D-Dimer No results for input(s): DDIMER in the last 72 hours. Hgb A1c No results for input(s): HGBA1C in the last 72 hours. Lipid Profile No results for input(s): CHOL, HDL, LDLCALC, TRIG, CHOLHDL, LDLDIRECT in the last 72 hours. Thyroid function studies Recent Labs    05/30/17 0820  TSH 2.826   Anemia work up No results for input(s): VITAMINB12, FOLATE, FERRITIN, TIBC, IRON, RETICCTPCT in the last 72 hours. Urinalysis    Component Value Date/Time   COLORURINE YELLOW 07/25/2016 1100   APPEARANCEUR HAZY (A) 07/25/2016 1100   LABSPEC 1.013 07/25/2016 1100   PHURINE 5.0 07/25/2016 1100   GLUCOSEU 50 (A) 07/25/2016 1100   HGBUR NEGATIVE 07/25/2016 1100   BILIRUBINUR NEGATIVE 07/25/2016 1100   KETONESUR NEGATIVE 07/25/2016 1100   PROTEINUR NEGATIVE 07/25/2016 1100   UROBILINOGEN 1.0 12/14/2014 1103   NITRITE NEGATIVE 07/25/2016 1100   LEUKOCYTESUR MODERATE (A) 07/25/2016 1100   Sepsis Labs Invalid input(s): PROCALCITONIN,  WBC,  LACTICIDVEN Microbiology Recent Results (from the past 240 hour(s))  Culture, blood (Routine x 2)     Status: None (Preliminary result)   Collection Time: 05/30/17  1:40 AM  Result Value Ref Range Status   Specimen Description BLOOD RIGHT HAND  Final   Special Requests   Final    BOTTLES DRAWN AEROBIC AND ANAEROBIC Blood Culture adequate volume   Culture NO GROWTH 1 DAY  Final   Report Status PENDING  Incomplete  Culture, blood (Routine x 2)     Status: None (Preliminary result)   Collection Time: 05/30/17  1:58 AM   Result Value Ref Range Status   Specimen Description BLOOD RIGHT FOREARM  Final   Special Requests   Final    BOTTLES DRAWN AEROBIC AND ANAEROBIC Blood Culture adequate volume   Culture NO GROWTH 1 DAY  Final   Report Status PENDING  Incomplete     Time coordinating discharge: Over 30 minutes  SIGNED:   Meredith Leeds, MD  [facility] 05/31/2017, 1:08 PM Pager [phone]  If 7PM-7AM, please contact night-coverage [email or web address] Password [email or web address]

## 2017-05-31 NOTE — Progress Notes (Addendum)
Patient very well known to me through previous hospitalizations.  She does not follow-up with any outpatient clinic appointments and has not previously allowed [facility] or [facility] into her home to do home visits.  Per Dr. Shirlee Latch I have filled the following medications through the [facility] for 90 days and paid her deductible as she says she does not have the money to get medications at discharge.  Eliquis 5 mg bid Losartan 25 mg daily Digoxin 0.125 daily  Her Toprol XL 50 mg bid--was just filled on Dec 31st through [facility] and is therefore currently unavailable.  She tells me that she has 2 weeks of this medication at home.  I will send this medication prescription  electronically to [facility] and have [facility] deliver to her when available.

## 2017-06-01 ENCOUNTER — Telehealth (HOSPITAL_COMMUNITY): Payer: Self-pay | Admitting: Surgery

## 2017-06-01 ENCOUNTER — Telehealth (HOSPITAL_COMMUNITY): Payer: Self-pay | Admitting: Student

## 2017-06-01 NOTE — Telephone Encounter (Signed)
Staff message from Meredith Staggers, RN asking that I contact patient with hospital follow up appt.  I called and spoke with patient.  She declined appt with APP on 06/08/17 at 1:30.  Pt stated she was just discharged yesterday and did not wish to come back for an appt that soon.  She stated that she had been set up for some Nurses to come to her home and check on her.  Therefore, she did not feel she needed this appt.  Pt stated she will call our office next month if she feels that she needs to come in for an appt.

## 2017-06-01 NOTE — Telephone Encounter (Signed)
Patient referred to [facility].  I have sent all appropriate paperwork via secure email to paramedic team.

## 2017-06-03 ENCOUNTER — Telehealth (HOSPITAL_COMMUNITY): Payer: Self-pay

## 2017-06-03 NOTE — Telephone Encounter (Signed)
Pt called me this morning to cancel our appointment. She reports her upstairs neighbors were very loud last night and she didn't get any sleep and wants me to call her Monday to sch a visit for Tuesday.   Kerry Hough, EMT-Paramedic  06/03/17

## 2017-06-04 LAB — CULTURE, BLOOD (ROUTINE X 2)
Culture: NO GROWTH
Culture: NO GROWTH
Special Requests: ADEQUATE
Special Requests: ADEQUATE

## 2017-06-06 ENCOUNTER — Other Ambulatory Visit (HOSPITAL_COMMUNITY): Payer: Self-pay

## 2017-06-06 MED ORDER — METOPROLOL SUCCINATE ER 50 MG PO TB24: 50.0000 mg | ORAL_TABLET | Freq: Two times a day (BID) | ORAL | 2 refills | Status: DC

## 2017-06-08 ENCOUNTER — Encounter (HOSPITAL_COMMUNITY): Payer: Self-pay

## 2017-06-09 ENCOUNTER — Telehealth (HOSPITAL_COMMUNITY): Payer: Self-pay

## 2017-06-09 NOTE — Telephone Encounter (Signed)
Pt called me to cancel todays appointment. She reports that her rent was increased out of the blue and she is stressed and wants to resch our visit. This is 2wks in a row she has done this.   Kerry Hough, EMT-Paramedic  06/09/17

## 2017-06-15 ENCOUNTER — Other Ambulatory Visit (HOSPITAL_COMMUNITY): Payer: Self-pay

## 2017-06-15 NOTE — Progress Notes (Signed)
Paramedicine Encounter    Patient ID: Kaitlyn [name], female    DOB: 1951-04-19, 67 y.o.   MRN: [id number]   Patient Care Team: Laurey Morale, MD as PCP - General (Cardiology)  Patient Active Problem List   Diagnosis Date Noted  . NSTEMI (non-ST elevated myocardial infarction) (HCC) 01/09/2017  . Ischemic cardiomyopathy 01/09/2017  . Obesity, Class III, BMI 40-49.9 (morbid obesity) (HCC) 01/09/2017  . Chest pain   . Atrial fibrillation with RVR (HCC) 01/07/2017  . Diarrhea   . Morbid obesity (HCC)   . Anaphylaxis 12/04/2015  . Hypotension due to hypovolemia 12/01/2015  . Leukocytosis   . Type 2 diabetes mellitus with hyperglycemia, with long-term current use of insulin (HCC)   . Lactic acidosis 07/07/2015  . Dehydration 07/07/2015  . Angioedema 07/07/2015  . Allergic reaction 07/07/2015  . Acute on chronic combined systolic and diastolic heart failure (HCC) 03/24/2015  . Acute on chronic congestive heart failure (HCC) 02/12/2015  . Congestive heart disease (HCC)   . Atrial fibrillation with rapid ventricular response (HCC)   . Noncompliance with medication regimen   . Shortness of breath 12/14/2014  . Acute on chronic systolic heart failure (HCC)   . Diabetes type 2, uncontrolled (HCC)   . Thyroid nodule   . Seizures (HCC)   . Hypokalemia   . Hypomagnesemia   . Lymphadenopathy 09/24/2014  . Chronic systolic (congestive) heart failure (HCC) 08/04/2014  . Macrocytosis   . Essential hypertension   . Psychosocial impairment   . Cardiomyopathy-EF 15-20% by echo Jan 2016 07/03/2014  . Obesity (BMI 30-39.9) 03/23/2014  . H/O noncompliance with medical treatment, presenting hazards to health 03/23/2014  . Chronic atrial fibrillation (HCC) 03/23/2014    Current Outpatient Medications:  .  apixaban (ELIQUIS) 5 MG TABS tablet, Take 1 tablet (5 mg total) by mouth 2 (two) times daily., Disp: 60 tablet, Rfl: 2 .  digoxin (LANOXIN) 0.125 MG tablet, Take 1 tablet (0.125 mg total)  by mouth daily., Disp: 30 tablet, Rfl: 2 .  losartan (COZAAR) 25 MG tablet, Take 1 tablet (25 mg total) by mouth daily., Disp: 30 tablet, Rfl: 2 .  metoprolol succinate (TOPROL-XL) 50 MG 24 hr tablet, Take 1 tablet (50 mg total) by mouth 2 (two) times daily. Take with or immediately following a meal., Disp: 180 tablet, Rfl: 2 .  metFORMIN (GLUCOPHAGE) 500 MG tablet, Take 1 tablet (500 mg total) by mouth 2 (two) times daily with a meal. (Patient not taking: Reported on 06/15/2017), Disp: 60 tablet, Rfl: 2 Allergies  Allergen Reactions  . Caffeine Other (See Comments)    Seizure from large doses, heart races from small doses  . Cranberry Shortness Of Breath, Diarrhea, Itching and Other (See Comments)    Severe headache.."Ocean Spray cranberry juice"  . Lisinopril Diarrhea, Itching and Swelling  . Penicillins Other (See Comments)    Unknown childhood allergy Has patient had a PCN reaction causing immediate rash, facial/tongue/throat swelling, SOB or lightheadedness with hypotension: unknown Has patient had a PCN reaction causing severe rash involving mucus membranes or skin necrosis: unknown Has patient had a PCN reaction that required hospitalization unknown Has patient had a PCN reaction occurring within the last 10 years: unknown If all of the above answers are "NO", then may proceed with Cephalosporin use.   . Other Other (See Comments)    Stimulants Pt has a past hx of seizures     Social History   Socioeconomic History  . Marital status: Single  Spouse name: Not on file  . Number of children: Not on file  . Years of education: Not on file  . Highest education level: Not on file  Social Needs  . Financial resource strain: Not on file  . Food insecurity - worry: Not on file  . Food insecurity - inability: Not on file  . Transportation needs - medical: Not on file  . Transportation needs - non-medical: Not on file  Occupational History  . Occupation: Retired from [employer]  . Smoking status: Never Smoker  . Smokeless tobacco: Never Used  Substance and Sexual Activity  . Alcohol use: No  . Drug use: No  . Sexual activity: Not Currently  Other Topics Concern  . Not on file  Social History Narrative   Lives alone.  She is an only child.    Physical Exam      No future appointments. BP (!) 186/100   Pulse 100   Resp 15   SpO2 97%   [facility]G [id number]   Pt finally allowed me to come out for home visit. She moved here in [place] a couple years ago from [place]. She just moved to this apt about a year ago. She lives on bottom floor so she doesn't have to deal with steps--when she goes.  She lives alone. She has 3 cousins that live close by and she talks to every few months.  She gets SSI monthly. [employer] delivers once a month.  Is she eligible for medicaid??  No food stamps. She doesn't have transportation-she doesn't leave her apt.  Need to see if she is eligible for extra help program--she gets $1079 monthly-- She is open to me looking into more resources.  She has a walker that she uses.  [facility].  She does not have PCP-- She reports she is not taking metformin--she is taking potassium and furosemide--she states that she urinates well without the lasix and she isnt retaining fluid. Her potassium is expired so I advised her to discard them.  She states she isnt taking metformin due to the possible side effects.  She reports she is doing a low sodium diet.  She reports her breathing will get sob if she is walking too much or on her feet too long. No issues sleeping at night when she sleeps-her sleep pattern is all  Over the place.  She needs a shower chair. No grab bars in bathroom for safety concerns.  She states she is prone to diarrhea from her diet-soft foods-oatmeal, soft melted cheeses.  She also needs scales.  Her b/p is elevated--she adamantly states she is taking her meds.  Pt is  adamantly refusing for me to call to make clinic appointment-she states she will call in a few wks to sch something.  Will continue to f/u.    ACTION: Home visit completed  Kerry Hough, EMT-Paramedic 06/15/17

## 2017-07-11 ENCOUNTER — Telehealth (HOSPITAL_COMMUNITY): Payer: Self-pay

## 2017-07-11 NOTE — Telephone Encounter (Signed)
Contacted pt regarding home visit and she reports that she is too busy this week, its payday this week and then she needs to get her groceries. she reports taking all her meds, no issues noted from her this week. She agrees to a call next Monday to see about a visit next week.    Jillisa Harris-EMT-Paramedic  07/11/17

## 2017-08-09 ENCOUNTER — Telehealth (HOSPITAL_COMMUNITY): Payer: Self-pay

## 2017-08-09 NOTE — Telephone Encounter (Signed)
Pt called me to report she  May be having an allergic reaction to something--stating she began to itch to her palms and scalp a couple hours prior to calling me. She denies any sob, no sob while speaking to her on phone, no dizziness, she denies any swelling anywhere, no rash was noted. She did take 50mg  of benadryl and feels like those symptoms are subsiding. I advised her to call 911 if she became worse and symptoms returned-she felt comfortable with that as she feels no response is needed at this time.   Kerry Hough, EMT-Paramedic  08/09/17

## 2017-08-24 ENCOUNTER — Telehealth (HOSPITAL_COMMUNITY): Payer: Self-pay

## 2017-08-24 NOTE — Telephone Encounter (Signed)
Called pt twice this week to sch a visit and left a message, no return call.   Kerry Hough, EMT-Paramedic  08/24/17

## 2017-08-30 ENCOUNTER — Other Ambulatory Visit (HOSPITAL_COMMUNITY): Payer: Self-pay

## 2017-08-30 NOTE — Progress Notes (Signed)
Telephone encounter--- Called pt to try to sch home visit, no answer-left message.   Kerry Hough, EMT-Paramedic  08/30/17

## 2017-09-08 ENCOUNTER — Other Ambulatory Visit (HOSPITAL_COMMUNITY): Payer: Self-pay | Admitting: *Deleted

## 2017-09-09 ENCOUNTER — Other Ambulatory Visit (HOSPITAL_COMMUNITY): Payer: Self-pay | Admitting: *Deleted

## 2017-09-09 ENCOUNTER — Telehealth (HOSPITAL_COMMUNITY): Payer: Self-pay | Admitting: *Deleted

## 2017-09-09 MED ORDER — DIGOXIN 125 MCG PO TABS: 0.1250 mg | ORAL_TABLET | Freq: Every day | ORAL | 0 refills | Status: DC

## 2017-09-09 MED ORDER — LOSARTAN POTASSIUM 25 MG PO TABS: 25.0000 mg | ORAL_TABLET | Freq: Every day | ORAL | 0 refills | Status: DC

## 2017-09-09 NOTE — Telephone Encounter (Signed)
Advanced Heart Failure Triage Encounter  Patient Name: Mekaela Nawrot  Date of Call: 09/09/17  Problem:  [facility] called request for refills for patients digoxin and losartan.  I explained to him that we have been trying to reach patient for some time and she has never showed up for her appointments.  I told him that patient will need to call clinic before we can refill her medications.  Patient immediatly called back demanding for her refills.   Plan:  After speaking with Meredith Staggers, RN I have explained to patient that we will give her one more chance to show up for an appointment in our clinic.  We will send a 30 day supply and if she doesn't come to her appointment we will no longer be able to refill her medications.  We'll also arrange for a cab to pickup her up as she doesn't have any transportation. Patient is agreeable with plan and voiced understanding.  Refills sent to pharmacy and appointment scheduled.  [facility] is also aware of the agreement. No further questions.   Georgina Peer, RN

## 2017-09-15 ENCOUNTER — Telehealth (HOSPITAL_COMMUNITY): Payer: Self-pay

## 2017-09-19 NOTE — Telephone Encounter (Signed)
Called pt a couple times this week and left messages for her to return my call, she did not return call be end of week.  She typically only calls when she needs immodium out to her house and if she doesn't have the money to pay for it or the pharmacy cant deliver until the next day.    Kerry Hough, EMT-Paramedic  09/19/17

## 2017-10-03 ENCOUNTER — Telehealth: Payer: Self-pay | Admitting: Licensed Clinical Social Worker

## 2017-10-03 NOTE — Telephone Encounter (Signed)
CSW received call form patient stating "I have too much going on this week and need to cancel my appointment". CSW encouraged patient to reschedule but she adamantly refused stating she will call when needed. CSW confirmed up to date on all medications and will continue with [employer]. CSW will coordinate with Paramedic and continue to follow. Lasandra Beech, LCSW, CCSW-MCS [phone]

## 2017-10-04 ENCOUNTER — Encounter (HOSPITAL_COMMUNITY): Payer: Self-pay

## 2017-10-04 ENCOUNTER — Telehealth (HOSPITAL_COMMUNITY): Payer: Self-pay

## 2017-10-04 NOTE — Telephone Encounter (Signed)
Late entry--Called pt to inquire about her cancelling the clinic appointment this week, she did not answer the phone, I left a message for her to return my call.     Kerry Hough, EMT-Paramedic  10-03-17

## 2017-10-12 ENCOUNTER — Telehealth (HOSPITAL_COMMUNITY): Payer: Self-pay | Admitting: Surgery

## 2017-10-12 NOTE — Telephone Encounter (Signed)
Kaitlyn [name] will be discharged at this time from [facility] program.  She has been difficult to reach by phone and often refuses home visits.  She only calls when she has needs. She can continue to call with needs and the Paramedic team will assist as needed due to her high risk for readmission.

## 2017-10-26 ENCOUNTER — Other Ambulatory Visit (HOSPITAL_COMMUNITY): Payer: Self-pay

## 2017-10-26 NOTE — Progress Notes (Signed)
Pt called and left me a message last night stating she needed some ASA for a pulled muscle in her back and was asking if I could bring her some--she later called back and advised that a neighbor was going to get her some and she no longer needed me to come by.    Kerry Hough, EMT-Paramedic  10/26/17

## 2017-11-19 ENCOUNTER — Emergency Department (HOSPITAL_COMMUNITY)
Admission: EM | Admit: 2017-11-19 | Discharge: 2017-11-19 | Disposition: A | Discharge status: Home / Self Care | Payer: Medicare Other | Attending: Emergency Medicine | Admitting: Emergency Medicine

## 2017-11-19 DIAGNOSIS — N3000 Acute cystitis without hematuria: Secondary | ICD-10-CM

## 2017-11-19 DIAGNOSIS — Z79899 Other long term (current) drug therapy: Secondary | ICD-10-CM | POA: Diagnosis not present

## 2017-11-19 DIAGNOSIS — Z7984 Long term (current) use of oral hypoglycemic drugs: Secondary | ICD-10-CM | POA: Diagnosis not present

## 2017-11-19 DIAGNOSIS — N3090 Cystitis, unspecified without hematuria: Secondary | ICD-10-CM | POA: Insufficient documentation

## 2017-11-19 DIAGNOSIS — E119 Type 2 diabetes mellitus without complications: Secondary | ICD-10-CM | POA: Insufficient documentation

## 2017-11-19 DIAGNOSIS — I481 Persistent atrial fibrillation: Secondary | ICD-10-CM | POA: Diagnosis not present

## 2017-11-19 DIAGNOSIS — Z7901 Long term (current) use of anticoagulants: Secondary | ICD-10-CM | POA: Insufficient documentation

## 2017-11-19 DIAGNOSIS — I5022 Chronic systolic (congestive) heart failure: Secondary | ICD-10-CM | POA: Diagnosis not present

## 2017-11-19 DIAGNOSIS — R350 Frequency of micturition: Secondary | ICD-10-CM | POA: Diagnosis present

## 2017-11-19 DIAGNOSIS — I110 Hypertensive heart disease with heart failure: Secondary | ICD-10-CM | POA: Diagnosis not present

## 2017-11-19 LAB — CBC WITH DIFFERENTIAL/PLATELET
Basophils Absolute: 0.1 10*3/uL (ref 0.0–0.1)
Basophils Relative: 2 %
Eosinophils Absolute: 0.4 10*3/uL (ref 0.0–0.7)
Eosinophils Relative: 5 %
HCT: 33.7 % — ABNORMAL LOW (ref 36.0–46.0)
Hemoglobin: 9.8 g/dL — ABNORMAL LOW (ref 12.0–15.0)
Lymphocytes Relative: 11 %
Lymphs Abs: 0.8 10*3/uL (ref 0.7–4.0)
MCH: 34.3 pg — ABNORMAL HIGH (ref 26.0–34.0)
MCHC: 29.1 g/dL — ABNORMAL LOW (ref 30.0–36.0)
MCV: 117.8 fL — ABNORMAL HIGH (ref 78.0–100.0)
Monocytes Absolute: 0.4 10*3/uL (ref 0.1–1.0)
Monocytes Relative: 6 %
Neutro Abs: 5.7 10*3/uL (ref 1.7–7.7)
Neutrophils Relative %: 76 %
Platelets: 174 10*3/uL (ref 150–400)
RBC: 2.86 MIL/uL — ABNORMAL LOW (ref 3.87–5.11)
RDW: 16.4 % — ABNORMAL HIGH (ref 11.5–15.5)
WBC: 7.4 10*3/uL (ref 4.0–10.5)

## 2017-11-19 LAB — URINALYSIS, ROUTINE W REFLEX MICROSCOPIC
Bilirubin Urine: NEGATIVE
Glucose, UA: NEGATIVE mg/dL
Hgb urine dipstick: NEGATIVE
Ketones, ur: NEGATIVE mg/dL
Nitrite: NEGATIVE
Protein, ur: NEGATIVE mg/dL
Specific Gravity, Urine: 1.012 (ref 1.005–1.030)
pH: 5 (ref 5.0–8.0)

## 2017-11-19 LAB — BASIC METABOLIC PANEL
Anion gap: 8 (ref 5–15)
BUN: 11 mg/dL (ref 8–23)
CO2: 24 mmol/L (ref 22–32)
Calcium: 8 mg/dL — ABNORMAL LOW (ref 8.9–10.3)
Chloride: 108 mmol/L (ref 98–111)
Creatinine, Ser: 0.97 mg/dL (ref 0.44–1.00)
GFR calc Af Amer: >60 mL/min (ref >60)
GFR calc non Af Amer: 59 mL/min — ABNORMAL LOW (ref >60)
Glucose, Bld: 135 mg/dL — ABNORMAL HIGH (ref 70–99)
Potassium: 3.7 mmol/L (ref 3.5–5.1)
Sodium: 140 mmol/L (ref 135–145)

## 2017-11-19 MED ORDER — NITROFURANTOIN MONOHYD MACRO 100 MG PO CAPS: 100.0000 mg | ORAL_CAPSULE | Freq: Two times a day (BID) | ORAL | 0 refills | Status: DC

## 2017-11-19 MED ORDER — PHENAZOPYRIDINE HCL 200 MG PO TABS: 200.0000 mg | ORAL_TABLET | Freq: Three times a day (TID) | ORAL | 0 refills | Status: DC

## 2017-11-19 MED ORDER — PHENAZOPYRIDINE HCL 100 MG PO TABS
200.0000 mg | ORAL_TABLET | Freq: Once | ORAL | Status: AC
  Administered 2017-11-19: 200 mg via ORAL
  Filled 2017-11-19: qty 2

## 2017-11-19 MED ORDER — NITROFURANTOIN MONOHYD MACRO 100 MG PO CAPS
100.0000 mg | ORAL_CAPSULE | Freq: Once | ORAL | Status: AC
  Administered 2017-11-19: 100 mg via ORAL
  Filled 2017-11-19: qty 1

## 2017-11-19 MED ORDER — METOPROLOL SUCCINATE ER 50 MG PO TB24
50.0000 mg | ORAL_TABLET | Freq: Once | ORAL | Status: AC
  Administered 2017-11-19: 50 mg via ORAL
  Filled 2017-11-19: qty 1

## 2017-11-19 NOTE — ED Provider Notes (Signed)
Pt cannot perform ADL's safely without using any other equipment beside a wheelchair.  Pt has experienced falls using a walker and needs a wheelchair for safety.    Renne Crigler, PA-C 11/19/17 1235    Wynetta Fines, MD 11/20/17 [phone]

## 2017-11-19 NOTE — Care Management Note (Addendum)
Case Management Note  Patient Details  Name: Kaitlyn [name] MRN: [id number] Date of Birth: 04-24-1951  Subjective/Objective:     Pt presented to ED via ambulance with c/o urinary incontinence and inability to stand from her chair at home during the last 3-4 days.  Pt states that she sits in rocking recliner all day and has been unable to get up independently for the last few day. She lives alone and has 2 neighbors that come over 2x/day to help her get to the bathroom to have bowel movements.  Otherwise, she has been urinating in the chair.  Per RN and EMS, patient's home is unfit, patient was dirty upon arrival, patient smelled of urine and has excoriation in skin folds.  Pt has no family around and has Meals on Wheels.  She has been unable to get up to answer door or go to kitchen for food. Pt states she can walk with a walker without a problem once she is standing.  Pt requests a wheelchair to sit in during the day because she will be able to get up to ambulate easier than from her recliner.  Also, she can maneuver W/c in the home if she is unable to stand up.  Pt states she has no money to buy any chair to replace her recliner.  Pt has not taken meds in 1 month because they cause more frequent urination.  Pt states she has medications and groceries delivered to her home.  Pt attends CHF clinic with [facility] transportation.  Pt states her medications are $4/each.  She states she does not have Medicaid, but did in [place] prior to moving here.  Pt also requests a lift chair.  ED provider placed orders for [facility] and HH services. Admission is not likely at this point.  Pt states she does not want or need HH services at this time and she "does not want people in her home and a bunch of things to think about ". Pt states if she has the wheelchair, she can adequately care for herself. Pt refuses possibility of SNF placement.  CSW, [name], has been in contact with patient and facilitated plans for d/c.           Action/Plan: [name] with [facility] notified of WC order.  WC will be delivered to patient's room.  Lift chairs have to be obtained from the [employer] store and Medicare only covers a small portion of the cost. Notified patient.  Encouraged patient to consider [facility] further for assistance with baths, Medicaid application, strengthening, and nursing management.  She agrees to consider after she is home with her wheelchair.  Referral called to [facility] with [facility].  Expected Discharge Date:     11/19/13             Expected Discharge Plan:  Home w Home Health Services  In-House Referral:  NA  Discharge planning Services  CM Consult  Post Acute Care Choice:  Durable Medical Equipment, Home Health Choice offered to:  Patient  DME Arranged:  Wheelchair manual DME Agency:  [facility]  HH Arranged:  RN, PT, OT, Nurse's Aide, Social Work [employer] Agency:  [facility]  Status of Service:  Completed, signed off  If discussed at [employer], dates discussed:    Additional Comments:  [name], RN 11/19/2017, 2:03 PM

## 2017-11-19 NOTE — ED Notes (Signed)
Blood for labs drawn via butterfly needle from anterior aspect of RFA  -- pt tolerated well; taken to mini-lab

## 2017-11-19 NOTE — Discharge Instructions (Signed)
Please read and follow all provided instructions.  Your diagnoses today include:  1. Acute cystitis without hematuria    Tests performed today include:  Urine test - suggests that you have an infection in your bladder  Blood counts and electrolytes   Vital signs. See below for your results today.   Medications prescribed:   Macrobid - antibiotic for urinary tract infection  You have been prescribed an antibiotic medicine: take the entire course of medicine even if you are feeling better. Stopping early can cause the antibiotic not to work.   Pyridium - medication for urinary tract infection symptoms.   This medication will turn your urine orange. This is normal.   Home care instructions:  Follow any educational materials contained in this packet.  Follow-up instructions: Please follow-up with your primary care provider in 3 days if symptoms are not resolved for further evaluation of your symptoms.  Return instructions:   Please return to the Emergency Department if you experience worsening symptoms.   Return with fever, worsening pain, persistent vomiting, worsening pain in your back.   Please return if you have any other emergent concerns.  Additional Information:  Your vital signs today were: BP 107/86 (BP Location: Right Arm)    Pulse (!) 103    Temp 98 F (36.7 C) (Oral)    Resp 20    SpO2 95%  If your blood pressure (BP) was elevated above 135/85 this visit, please have this repeated by your doctor within one month. --------------

## 2017-11-19 NOTE — ED Notes (Signed)
[facility] contacted to transport patient home. 

## 2017-11-19 NOTE — Progress Notes (Signed)
CSW received a call from the RN CM stating [facility] will only deliver the pt's needed wheelchair to the hospital and state they will not deliver the pt's wheelchair to the pt's home.  RN CM states a solution is needed to get the pt's wheelchair home.  CSW will continue to follow for D/C needs.  Dorothe Pea. Casmira Cramer, LCSW, LCAS, CSI Clinical Social Worker Ph: [phone]     '

## 2017-11-19 NOTE — ED Triage Notes (Signed)
BIB EMS for increase in urination over last few days; pt disheveled; malodorous; reports lives alone - normally ambulates with walker; however, reports has recently had increased difficulty getting out of chair; hence, has been urinating on self; EMS reports poor living conditions - apt not clean; pt states has a neighbor come to assist her with ADLs twice a day; however, has difficulty when no one is there
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
## 2017-11-19 NOTE — Progress Notes (Addendum)
Consult request has been received. CSW attempting to follow up at present time.  CSW spoke to EDP who is requesting that CSW review pt's current resources available to the pt, with the pt, to attempt to seek a safe D/C plan should be medically cleared.  CSW attempted to call pt's phone at ph: [phone] and received no answer.  Dorothe Pea. Lamar Meter, LCSW, LCAS, CSI Clinical Social Worker Ph: [phone]

## 2017-11-19 NOTE — ED Notes (Signed)
Water given per pt request - resting quietly on stretcher without complaint

## 2017-11-19 NOTE — Care Management (Addendum)
[facility] could not provide an appropriate size wheelchair to patient in the hospital.  [facility] arranged to transport patient home.  [facility] will deliver "heavy-duty" wheelchair to patient's address this evening.  Patient suffers from weakness which impairs their ability to perform daily activities like getting to the bathroom, toileting, bathing, dressing in the home.  A walker will not resolve  issue with performing activities of daily living. A wheelchair will allow patient to safely perform daily activities. Patient can safely propel the wheelchair in the home or has a caregiver who can provide assistance.  Accessories: elevating leg rests (ELRs), wheel locks, extensions and anti-tippers.

## 2017-11-19 NOTE — ED Notes (Signed)
Peri-care done - pure wick placed - hooked to low suction; pt very irrate during same - interm. raising voice to this RN --  Explained to pt that we needed to cleanse her lower abd, vaginal and rectal areas - pt states to leave her alone, she would just "stay dirty"; pt informed that we could not allow her to remain uncleansed as her skin beneath panas as well as vaginal, perineal and rectal areas were areas cleansed, dried, pure wick in place; pt continued to be condescending to staff; states "I am suppose to be the one complaining and you are suppose to act like an angel"; informed pt that raising voice to staff would not be tolerated; pt replied "Just do what you need to do and get out"; placed on ccm showing atrial fib rate 103-126 (hx of atrial fib - has been off meds for aprox 1 month at own will); BP cuff placed to rgt arm - pt removed cuff and threw over side rail - informed pt that BP cuff needed to remain on for subsequent BP checks - cuff reapplied - pt stated "do not put it on tight, leave about 1 inch loose"

## 2017-11-19 NOTE — Progress Notes (Signed)
CSW provided a taxi voucher to the pt's RN to transport the pt's wheelchair home, at the pt's insistence that once the pt is D/C'd home by [facility] and the pt's wheelchair can be delivered by taxi, the pt's neighbor can assist the pt and the taxi driver idf necessary with getting the pt's wheelchair into the pt front door.  Pt is agreeable to this plan and insists upon it.  CSW provided a taxi voucher to the pt's RN and the RN CM is insuring the wheelchair is delivered to the pt's room and the RN is insuring the taxi will be called only after the pt D/C's via [facility] to insure the pt will be home to receive the pt's wheelchair.  Please reconsult if future social work needs arise.  CSW signing off, as social work intervention is no longer needed.  Dorothe Pea. Hadassah Rana, LCSW, LCAS, CSI Clinical Social Worker Ph: [phone]

## 2017-11-19 NOTE — Progress Notes (Addendum)
CSW spoke to the pt via the pt's room phone and pt confirmed   Walks with a walker but I can't get out of a chair, but can use a wheelchair so I can move around and transition from the recliner to her walker.  Pt stated she pulled my back out" and ever since have not been able to get out of my recliner since.  Pt states she is on social security and cannot afford to buy a wheelchair and would like assistance with obtaining one.  Pt states she has two neighbors who each come over once a day to assist the pt with using then bathroom but states a wheelchair is what she needs to assist her 24 hours a day.  CSW spoke to the RN CM.  CSW will update the EPD that the RN CM will write the correct order for the wheelchair and the the EPD will need to sign it.  Per RN CM, in order for Medicare to pay for a wheelchair the EPD must write a progress note stating, "  "Pt cannot perform ADL's safely without using any other equipment beside a wheelchair.  Pt has experienced falls using a walker and needs a wheelchair for safety".  CSW will update the EDP.  RN CM states she will update the pt about [facility] services and the wheelchair.  EDP updated and RN CM aware.  CSW will continue to follow forD/C needs.  Dorothe Pea. Linsie Lupo, LCSW, LCAS, CSI Clinical Social Worker Ph: [phone]

## 2017-11-20 LAB — URINE CULTURE: Culture: NO GROWTH

## 2017-11-22 ENCOUNTER — Encounter (HOSPITAL_COMMUNITY): Payer: Self-pay | Admitting: Emergency Medicine

## 2017-11-22 ENCOUNTER — Inpatient Hospital Stay (HOSPITAL_COMMUNITY)
Admission: EM | Admit: 2017-11-22 | Discharge: 2017-11-29 | DRG: 292 | Disposition: A | Discharge status: Home Health | Payer: Medicare Other | Attending: Family Medicine | Admitting: Family Medicine

## 2017-11-22 ENCOUNTER — Emergency Department (HOSPITAL_COMMUNITY): Payer: Medicare Other

## 2017-11-22 DIAGNOSIS — R0602 Shortness of breath: Secondary | ICD-10-CM | POA: Diagnosis not present

## 2017-11-22 DIAGNOSIS — I481 Persistent atrial fibrillation: Secondary | ICD-10-CM | POA: Diagnosis present

## 2017-11-22 DIAGNOSIS — E119 Type 2 diabetes mellitus without complications: Secondary | ICD-10-CM | POA: Diagnosis present

## 2017-11-22 DIAGNOSIS — I472 Ventricular tachycardia: Secondary | ICD-10-CM | POA: Diagnosis not present

## 2017-11-22 DIAGNOSIS — E876 Hypokalemia: Secondary | ICD-10-CM | POA: Diagnosis present

## 2017-11-22 DIAGNOSIS — I428 Other cardiomyopathies: Secondary | ICD-10-CM | POA: Diagnosis present

## 2017-11-22 DIAGNOSIS — Z79899 Other long term (current) drug therapy: Secondary | ICD-10-CM

## 2017-11-22 DIAGNOSIS — E1165 Type 2 diabetes mellitus with hyperglycemia: Secondary | ICD-10-CM | POA: Diagnosis present

## 2017-11-22 DIAGNOSIS — Z6841 Body Mass Index (BMI) 40.0 and over, adult: Secondary | ICD-10-CM

## 2017-11-22 DIAGNOSIS — Z7901 Long term (current) use of anticoagulants: Secondary | ICD-10-CM

## 2017-11-22 DIAGNOSIS — I5023 Acute on chronic systolic (congestive) heart failure: Secondary | ICD-10-CM

## 2017-11-22 DIAGNOSIS — I2510 Atherosclerotic heart disease of native coronary artery without angina pectoris: Secondary | ICD-10-CM | POA: Diagnosis present

## 2017-11-22 DIAGNOSIS — D649 Anemia, unspecified: Secondary | ICD-10-CM

## 2017-11-22 DIAGNOSIS — Z888 Allergy status to other drugs, medicaments and biological substances status: Secondary | ICD-10-CM

## 2017-11-22 DIAGNOSIS — Z880 Allergy status to penicillin: Secondary | ICD-10-CM

## 2017-11-22 DIAGNOSIS — R0902 Hypoxemia: Secondary | ICD-10-CM

## 2017-11-22 DIAGNOSIS — I110 Hypertensive heart disease with heart failure: Principal | ICD-10-CM | POA: Diagnosis present

## 2017-11-22 DIAGNOSIS — Z7982 Long term (current) use of aspirin: Secondary | ICD-10-CM

## 2017-11-22 DIAGNOSIS — Z833 Family history of diabetes mellitus: Secondary | ICD-10-CM

## 2017-11-22 DIAGNOSIS — I509 Heart failure, unspecified: Secondary | ICD-10-CM

## 2017-11-22 DIAGNOSIS — Z9114 Patient's other noncompliance with medication regimen: Secondary | ICD-10-CM

## 2017-11-22 DIAGNOSIS — G40909 Epilepsy, unspecified, not intractable, without status epilepticus: Secondary | ICD-10-CM | POA: Diagnosis present

## 2017-11-22 DIAGNOSIS — Z9119 Patient's noncompliance with other medical treatment and regimen: Secondary | ICD-10-CM

## 2017-11-22 DIAGNOSIS — I429 Cardiomyopathy, unspecified: Secondary | ICD-10-CM | POA: Diagnosis present

## 2017-11-22 DIAGNOSIS — I480 Paroxysmal atrial fibrillation: Secondary | ICD-10-CM | POA: Diagnosis present

## 2017-11-22 LAB — BASIC METABOLIC PANEL
Anion gap: 8 (ref 5–15)
BUN: 11 mg/dL (ref 8–23)
CO2: 24 mmol/L (ref 22–32)
Calcium: 7.7 mg/dL — ABNORMAL LOW (ref 8.9–10.3)
Chloride: 110 mmol/L (ref 98–111)
Creatinine, Ser: 0.89 mg/dL (ref 0.44–1.00)
GFR calc Af Amer: >60 mL/min (ref >60)
GFR calc non Af Amer: >60 mL/min (ref >60)
Glucose, Bld: 110 mg/dL — ABNORMAL HIGH (ref 70–99)
Potassium: 3.4 mmol/L — ABNORMAL LOW (ref 3.5–5.1)
Sodium: 142 mmol/L (ref 135–145)

## 2017-11-22 LAB — I-STAT TROPONIN, ED: Troponin i, poc: 0 ng/mL (ref 0.00–0.08)

## 2017-11-22 MED ORDER — ALBUTEROL SULFATE (2.5 MG/3ML) 0.083% IN NEBU
5.0000 mg | INHALATION_SOLUTION | Freq: Once | RESPIRATORY_TRACT | Status: AC
  Administered 2017-11-22: 5 mg via RESPIRATORY_TRACT
  Filled 2017-11-22: qty 6

## 2017-11-22 NOTE — ED Triage Notes (Signed)
Pt from home via [facility] with c/o of SOB. Pt states she usually is short of breath with ambulation but it "feels different this time." Pt disheveled and malodorous. States she lives at home alone. Per EMS they are called to the house routinely to help her ambulate around the home and complete ADLs. Family lives out of [place] and is unable to assist with her care.
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
## 2017-11-23 ENCOUNTER — Other Ambulatory Visit: Payer: Self-pay

## 2017-11-23 ENCOUNTER — Other Ambulatory Visit (HOSPITAL_COMMUNITY): Payer: Self-pay

## 2017-11-23 ENCOUNTER — Encounter (HOSPITAL_COMMUNITY): Payer: Self-pay | Admitting: Internal Medicine

## 2017-11-23 DIAGNOSIS — G40909 Epilepsy, unspecified, not intractable, without status epilepticus: Secondary | ICD-10-CM | POA: Diagnosis present

## 2017-11-23 DIAGNOSIS — I4891 Unspecified atrial fibrillation: Secondary | ICD-10-CM

## 2017-11-23 DIAGNOSIS — I480 Paroxysmal atrial fibrillation: Secondary | ICD-10-CM | POA: Diagnosis present

## 2017-11-23 DIAGNOSIS — I5021 Acute systolic (congestive) heart failure: Secondary | ICD-10-CM | POA: Diagnosis not present

## 2017-11-23 DIAGNOSIS — I5023 Acute on chronic systolic (congestive) heart failure: Secondary | ICD-10-CM

## 2017-11-23 DIAGNOSIS — Z9114 Patient's other noncompliance with medication regimen: Secondary | ICD-10-CM | POA: Diagnosis not present

## 2017-11-23 DIAGNOSIS — I110 Hypertensive heart disease with heart failure: Secondary | ICD-10-CM | POA: Diagnosis present

## 2017-11-23 DIAGNOSIS — Z833 Family history of diabetes mellitus: Secondary | ICD-10-CM | POA: Diagnosis not present

## 2017-11-23 DIAGNOSIS — I472 Ventricular tachycardia: Secondary | ICD-10-CM | POA: Diagnosis not present

## 2017-11-23 DIAGNOSIS — Z79899 Other long term (current) drug therapy: Secondary | ICD-10-CM | POA: Diagnosis not present

## 2017-11-23 DIAGNOSIS — Z7901 Long term (current) use of anticoagulants: Secondary | ICD-10-CM | POA: Diagnosis not present

## 2017-11-23 DIAGNOSIS — I2510 Atherosclerotic heart disease of native coronary artery without angina pectoris: Secondary | ICD-10-CM | POA: Diagnosis present

## 2017-11-23 DIAGNOSIS — Z9119 Patient's noncompliance with other medical treatment and regimen: Secondary | ICD-10-CM

## 2017-11-23 DIAGNOSIS — Z888 Allergy status to other drugs, medicaments and biological substances status: Secondary | ICD-10-CM | POA: Diagnosis not present

## 2017-11-23 DIAGNOSIS — Z880 Allergy status to penicillin: Secondary | ICD-10-CM | POA: Diagnosis not present

## 2017-11-23 DIAGNOSIS — R0602 Shortness of breath: Secondary | ICD-10-CM | POA: Diagnosis present

## 2017-11-23 DIAGNOSIS — D649 Anemia, unspecified: Secondary | ICD-10-CM | POA: Diagnosis not present

## 2017-11-23 DIAGNOSIS — I429 Cardiomyopathy, unspecified: Secondary | ICD-10-CM | POA: Diagnosis present

## 2017-11-23 DIAGNOSIS — E876 Hypokalemia: Secondary | ICD-10-CM

## 2017-11-23 DIAGNOSIS — I481 Persistent atrial fibrillation: Secondary | ICD-10-CM | POA: Diagnosis present

## 2017-11-23 DIAGNOSIS — Z6841 Body Mass Index (BMI) 40.0 and over, adult: Secondary | ICD-10-CM | POA: Diagnosis not present

## 2017-11-23 DIAGNOSIS — E1165 Type 2 diabetes mellitus with hyperglycemia: Secondary | ICD-10-CM | POA: Diagnosis present

## 2017-11-23 DIAGNOSIS — I340 Nonrheumatic mitral (valve) insufficiency: Secondary | ICD-10-CM | POA: Diagnosis not present

## 2017-11-23 DIAGNOSIS — I428 Other cardiomyopathies: Secondary | ICD-10-CM | POA: Diagnosis present

## 2017-11-23 DIAGNOSIS — Z7982 Long term (current) use of aspirin: Secondary | ICD-10-CM | POA: Diagnosis not present

## 2017-11-23 DIAGNOSIS — I509 Heart failure, unspecified: Secondary | ICD-10-CM

## 2017-11-23 LAB — URINALYSIS, ROUTINE W REFLEX MICROSCOPIC
Bacteria, UA: NONE SEEN
Bilirubin Urine: NEGATIVE
Glucose, UA: NEGATIVE mg/dL
Hgb urine dipstick: NEGATIVE
Ketones, ur: NEGATIVE mg/dL
Nitrite: NEGATIVE
Protein, ur: NEGATIVE mg/dL
Specific Gravity, Urine: 1.005 (ref 1.005–1.030)
pH: 6 (ref 5.0–8.0)

## 2017-11-23 LAB — GLUCOSE, CAPILLARY
Glucose-Capillary: 107 mg/dL — ABNORMAL HIGH (ref 70–99)
Glucose-Capillary: 115 mg/dL — ABNORMAL HIGH (ref 70–99)
Glucose-Capillary: 110 mg/dL — ABNORMAL HIGH (ref 70–99)
Glucose-Capillary: 123 mg/dL — ABNORMAL HIGH (ref 70–99)
Glucose-Capillary: 134 mg/dL — ABNORMAL HIGH (ref 70–99)

## 2017-11-23 LAB — CBC
HCT: 35.4 % — ABNORMAL LOW (ref 36.0–46.0)
Hemoglobin: 10.3 g/dL — ABNORMAL LOW (ref 12.0–15.0)
MCH: 33.7 pg (ref 26.0–34.0)
MCHC: 29.1 g/dL — ABNORMAL LOW (ref 30.0–36.0)
MCV: 115.7 fL — ABNORMAL HIGH (ref 78.0–100.0)
Platelets: 234 10*3/uL (ref 150–400)
RBC: 3.06 MIL/uL — ABNORMAL LOW (ref 3.87–5.11)
RDW: 16.6 % — ABNORMAL HIGH (ref 11.5–15.5)
WBC: 10 10*3/uL (ref 4.0–10.5)

## 2017-11-23 LAB — COMPREHENSIVE METABOLIC PANEL
ALT: 11 U/L (ref 0–44)
AST: 12 U/L — ABNORMAL LOW (ref 15–41)
Albumin: 3.1 g/dL — ABNORMAL LOW (ref 3.5–5.0)
Alkaline Phosphatase: 86 U/L (ref 38–126)
Anion gap: 11 (ref 5–15)
BUN: 11 mg/dL (ref 8–23)
CO2: 24 mmol/L (ref 22–32)
Calcium: 8.3 mg/dL — ABNORMAL LOW (ref 8.9–10.3)
Chloride: 107 mmol/L (ref 98–111)
Creatinine, Ser: 0.97 mg/dL (ref 0.44–1.00)
GFR calc Af Amer: >60 mL/min (ref >60)
GFR calc non Af Amer: 59 mL/min — ABNORMAL LOW (ref >60)
Glucose, Bld: 116 mg/dL — ABNORMAL HIGH (ref 70–99)
Potassium: 3.5 mmol/L (ref 3.5–5.1)
Sodium: 142 mmol/L (ref 135–145)
Total Bilirubin: 0.6 mg/dL (ref 0.3–1.2)
Total Protein: 6.6 g/dL (ref 6.5–8.1)

## 2017-11-23 LAB — CBC WITH DIFFERENTIAL/PLATELET
Basophils Absolute: 0.1 10*3/uL (ref 0.0–0.1)
Basophils Relative: 1 %
Eosinophils Absolute: 0.3 10*3/uL (ref 0.0–0.7)
Eosinophils Relative: 3 %
HCT: 34.9 % — ABNORMAL LOW (ref 36.0–46.0)
Hemoglobin: 10.1 g/dL — ABNORMAL LOW (ref 12.0–15.0)
Lymphocytes Relative: 13 %
Lymphs Abs: 1.1 10*3/uL (ref 0.7–4.0)
MCH: 34.2 pg — ABNORMAL HIGH (ref 26.0–34.0)
MCHC: 28.9 g/dL — ABNORMAL LOW (ref 30.0–36.0)
MCV: 118.3 fL — ABNORMAL HIGH (ref 78.0–100.0)
Monocytes Absolute: 0.6 10*3/uL (ref 0.1–1.0)
Monocytes Relative: 7 %
Neutro Abs: 6.6 10*3/uL (ref 1.7–7.7)
Neutrophils Relative %: 76 %
Platelets: 193 10*3/uL (ref 150–400)
RBC: 2.95 MIL/uL — ABNORMAL LOW (ref 3.87–5.11)
RDW: 16.6 % — ABNORMAL HIGH (ref 11.5–15.5)
WBC: 8.7 10*3/uL (ref 4.0–10.5)

## 2017-11-23 LAB — HIV ANTIBODY (ROUTINE TESTING W REFLEX): HIV Screen 4th Generation wRfx: NONREACTIVE

## 2017-11-23 LAB — TROPONIN I
Troponin I: <0.03 ng/mL (ref <0.03)
Troponin I: <0.03 ng/mL (ref <0.03)

## 2017-11-23 LAB — BASIC METABOLIC PANEL
Anion gap: 7 (ref 5–15)
BUN: 13 mg/dL (ref 8–23)
CO2: 29 mmol/L (ref 22–32)
Calcium: 8.2 mg/dL — ABNORMAL LOW (ref 8.9–10.3)
Chloride: 105 mmol/L (ref 98–111)
Creatinine, Ser: 1.18 mg/dL — ABNORMAL HIGH (ref 0.44–1.00)
GFR calc Af Amer: 54 mL/min — ABNORMAL LOW (ref >60)
GFR calc non Af Amer: 47 mL/min — ABNORMAL LOW (ref >60)
Glucose, Bld: 134 mg/dL — ABNORMAL HIGH (ref 70–99)
Potassium: 3.9 mmol/L (ref 3.5–5.1)
Sodium: 141 mmol/L (ref 135–145)

## 2017-11-23 LAB — MAGNESIUM: Magnesium: 1.9 mg/dL (ref 1.7–2.4)

## 2017-11-23 LAB — VITAMIN B12: Vitamin B-12: 103 pg/mL — ABNORMAL LOW (ref 180–914)

## 2017-11-23 LAB — TSH: TSH: 5.964 u[IU]/mL — ABNORMAL HIGH (ref 0.350–4.500)

## 2017-11-23 LAB — BRAIN NATRIURETIC PEPTIDE: B Natriuretic Peptide: 725.4 pg/mL — ABNORMAL HIGH (ref 0.0–100.0)

## 2017-11-23 MED ORDER — SODIUM CHLORIDE 0.9% FLUSH
3.0000 mL | Freq: Two times a day (BID) | INTRAVENOUS | Status: DC
  Administered 2017-11-23 – 2017-11-28 (×13): 3 mL via INTRAVENOUS

## 2017-11-23 MED ORDER — ENOXAPARIN SODIUM 40 MG/0.4ML ~~LOC~~ SOLN: 40.0000 mg | SUBCUTANEOUS | Status: DC

## 2017-11-23 MED ORDER — ACETAMINOPHEN 325 MG PO TABS: 650.0000 mg | ORAL_TABLET | Freq: Four times a day (QID) | ORAL | Status: DC | PRN

## 2017-11-23 MED ORDER — FUROSEMIDE 10 MG/ML IJ SOLN: 40.0000 mg | Freq: Every day | INTRAMUSCULAR | Status: DC

## 2017-11-23 MED ORDER — LOSARTAN POTASSIUM 25 MG PO TABS
25.0000 mg | ORAL_TABLET | Freq: Every day | ORAL | Status: DC
  Administered 2017-11-23 – 2017-11-29 (×7): 25 mg via ORAL
  Filled 2017-11-23 (×7): qty 1

## 2017-11-23 MED ORDER — DIGOXIN 125 MCG PO TABS
0.1250 mg | ORAL_TABLET | Freq: Every day | ORAL | Status: DC
  Administered 2017-11-23 – 2017-11-29 (×7): 0.125 mg via ORAL
  Filled 2017-11-23 (×7): qty 1

## 2017-11-23 MED ORDER — ACETAMINOPHEN 500 MG PO TABS
1000.0000 mg | ORAL_TABLET | Freq: Three times a day (TID) | ORAL | Status: DC | PRN
  Administered 2017-11-23 – 2017-11-29 (×9): 1000 mg via ORAL
  Filled 2017-11-23 (×9): qty 2

## 2017-11-23 MED ORDER — METOPROLOL TARTRATE 25 MG PO TABS
12.5000 mg | ORAL_TABLET | Freq: Two times a day (BID) | ORAL | Status: DC
  Administered 2017-11-23 (×2): 12.5 mg via ORAL
  Filled 2017-11-23 (×2): qty 1

## 2017-11-23 MED ORDER — SODIUM CHLORIDE 0.9% FLUSH: 3.0000 mL | INTRAVENOUS | Status: DC | PRN

## 2017-11-23 MED ORDER — ACETAMINOPHEN 650 MG RE SUPP: 650.0000 mg | Freq: Four times a day (QID) | RECTAL | Status: DC | PRN

## 2017-11-23 MED ORDER — METOPROLOL SUCCINATE ER 25 MG PO TB24
25.0000 mg | ORAL_TABLET | Freq: Two times a day (BID) | ORAL | Status: DC
  Administered 2017-11-24 – 2017-11-29 (×11): 25 mg via ORAL
  Filled 2017-11-23 (×11): qty 1

## 2017-11-23 MED ORDER — METOPROLOL SUCCINATE ER 25 MG PO TB24
25.0000 mg | ORAL_TABLET | Freq: Every day | ORAL | Status: DC
Start: 2017-11-23 — End: 2017-11-23
  Administered 2017-11-23: 25 mg via ORAL
  Filled 2017-11-23: qty 1

## 2017-11-23 MED ORDER — POTASSIUM CHLORIDE CRYS ER 20 MEQ PO TBCR
20.0000 meq | EXTENDED_RELEASE_TABLET | Freq: Once | ORAL | Status: DC
  Filled 2017-11-23: qty 1

## 2017-11-23 MED ORDER — INSULIN ASPART 100 UNIT/ML ~~LOC~~ SOLN: 0.0000 [IU] | Freq: Every day | SUBCUTANEOUS | Status: DC

## 2017-11-23 MED ORDER — FUROSEMIDE 10 MG/ML IJ SOLN
80.0000 mg | Freq: Once | INTRAMUSCULAR | Status: AC
  Administered 2017-11-23: 80 mg via INTRAVENOUS
  Filled 2017-11-23: qty 8

## 2017-11-23 MED ORDER — FUROSEMIDE 10 MG/ML IJ SOLN
40.0000 mg | Freq: Once | INTRAMUSCULAR | Status: AC
  Administered 2017-11-23: 40 mg via INTRAVENOUS
  Filled 2017-11-23: qty 4

## 2017-11-23 MED ORDER — FUROSEMIDE 10 MG/ML IJ SOLN
80.0000 mg | Freq: Two times a day (BID) | INTRAMUSCULAR | Status: DC
  Administered 2017-11-23 – 2017-11-26 (×6): 80 mg via INTRAVENOUS
  Filled 2017-11-23 (×7): qty 8

## 2017-11-23 MED ORDER — HYDROCODONE-ACETAMINOPHEN 5-325 MG PO TABS
2.0000 | ORAL_TABLET | Freq: Once | ORAL | Status: AC
  Administered 2017-11-23: 2 via ORAL
  Filled 2017-11-23: qty 2

## 2017-11-23 MED ORDER — APIXABAN 5 MG PO TABS
5.0000 mg | ORAL_TABLET | Freq: Two times a day (BID) | ORAL | Status: DC
  Administered 2017-11-23 – 2017-11-29 (×14): 5 mg via ORAL
  Filled 2017-11-23 (×14): qty 1

## 2017-11-23 MED ORDER — SODIUM CHLORIDE 0.9 % IV SOLN
250.0000 mL | INTRAVENOUS | Status: DC | PRN
  Administered 2017-11-24: 250 mL via INTRAVENOUS

## 2017-11-23 MED ORDER — INSULIN ASPART 100 UNIT/ML ~~LOC~~ SOLN
0.0000 [IU] | Freq: Three times a day (TID) | SUBCUTANEOUS | Status: DC
  Administered 2017-11-25 – 2017-11-26 (×2): 1 [IU] via SUBCUTANEOUS
  Administered 2017-11-27: 2 [IU] via SUBCUTANEOUS
  Administered 2017-11-27: 1 [IU] via SUBCUTANEOUS
  Administered 2017-11-28: 2 [IU] via SUBCUTANEOUS
  Administered 2017-11-28 – 2017-11-29 (×2): 1 [IU] via SUBCUTANEOUS
  Administered 2017-11-29: 2 [IU] via SUBCUTANEOUS

## 2017-11-23 NOTE — Progress Notes (Signed)
Patient refused Unna Boots because "No one told her about it and she doesn't want to be wrapped like a mummy". When this RN and Ortho Tech tried to explain the reason/benefits of the boots, she still refused. Ordering provider notified.

## 2017-11-23 NOTE — Plan of Care (Signed)
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
## 2017-11-23 NOTE — Progress Notes (Signed)
Orthopedic Tech Progress Note Patient Details:  Kaitlyn [name] 07/30/1950 [id number]  Patient ID: [name], female   DOB: 05-23-1950, 67 y.o.   MRN: [id number] Pt refused the unna boots; RN notified  [name] 11/23/2017, 3:19 PM

## 2017-11-23 NOTE — Progress Notes (Signed)
Patient refused PO potassium. RN educated patient on reason for potassium medication. Verbalized understanding of education and refuses medication at this time.

## 2017-11-23 NOTE — Progress Notes (Signed)
Subjective: Patient admitted this morning, see detailed H&P by Dr Selena Batten 67 year old female with a history of hypertension, CHF, EF 15 to 20%, paroxysmal atrial fibrillation, diabetes mellitus, seizure disorder came with dyspnea.  Chest x-ray showed cardiomegaly with mild vascular congestion.  Patient tells me that she stopped taking Lasix a few years back.  Vitals:   11/23/17 1139 11/23/17 1300  BP: 118/79   Pulse: 92   Resp: 18   Temp: 97.6 F (36.4 C)   SpO2: 96% 93%      A/P Acute systolic CHF  A. fib with RVR Anemia Hypokalemia Diabetes mellitus  Patient started on IV Lasix, will consult cardiology for further management. patient has not seen cardiology in almost a year.   Meredeth Ide [facility] [phone]
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
## 2017-11-23 NOTE — Discharge Instructions (Signed)

## 2017-11-23 NOTE — Clinical Social Work Note (Signed)
CSW acknowledges consult, "Patient is from home. No support with ADLS at home. No PCP physician. Needs set up with community resources." [facility] notified that patient has no PCP. Please consult PT and OT to determine post-discharge needs.  Charlynn Court, CSW [phone]
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
## 2017-11-23 NOTE — Plan of Care (Signed)
Nutrition Education Note  RD consulted for nutrition education regarding new onset CHF.  Lab Results  Component Value Date   HGBA1C 7.5 (H) 01/08/2017   Pt on no DM medications PTA.  CBGS: 110-115 (inpatient orders for glycemic control are 0-5 units insulin aspart q HS and 0-9 units insulin aspart TID with meals).   Spoke with pt at bedside, who reports good appetite. She consumed 100% of her lunch and breakfast. She shares with this RD that she has limited support and food resources at home; she is on a fixed income. Her neighbor comes to her home 2 times per day to assist with transfers. She shares that mobility has become increasingly more difficult, related to back pain and edema in legs- a wheelchair was just delivered to her home in attempt to increase mobility.   Pt cooks at home, but mainly convenience foods, as she cannot stand for long in the kitchen. She has groceries delivered to her home once a month from [employer] delivery service and also receives assistance from a mobile grocery delivery service (she receives 3 bags per month, which consist mainly of frozen pizza, frozen dinners, and canned vegetables). Meals generally consist of frozen dinners and canned vegetables. Pt rarely seasons food. She does drink canned vegetables. Discussed ways pt could decrease sodium in available food items, such as not adding additional salt or seasoning to foods and rinsing canned vegetables and putting into fresh water when preparing.   RD provided "Low Sodium Nutrition Therapy" handout from the Academy of [employer]. Reviewed patient's dietary recall. Provided examples on ways to decrease sodium intake in diet. Discouraged intake of processed foods and use of salt shaker. Encouraged fresh fruits and vegetables as well as whole grain sources of carbohydrates to maximize fiber intake.   RD discussed why it is important for patient to adhere to diet recommendations, and  emphasized the role of fluids, foods to avoid, and importance of weighing self daily.   Discussed different food groups and their effects on blood sugar, emphasizing carbohydrate-containing foods. Provided list of carbohydrates and recommended serving sizes of common foods.  Discussed importance of controlled and consistent carbohydrate intake throughout the day. Provided examples of ways to balance meals/snacks and encouraged intake of high-fiber, whole grain complex carbohydrates. Teach back method used.  Expect fair compliance.  Body mass index is 45.08 kg/m. Pt meets criteria for extreme obesity, class III based on current BMI.  Current diet order is Heart Healthy/ carb Modified, patient is consuming approximately 100% of meals at this time. Labs and medications reviewed. No further nutrition interventions warranted at this time. RD contact information provided. If additional nutrition issues arise, please re-consult RD.   Natori Gudino A. Mayford Knife, RD, LDN, CDE Pager: [phone] After hours Pager: [phone]

## 2017-11-23 NOTE — Progress Notes (Signed)
Patient had 35 runs of v-tach around 1845 notified by ccmd. Patient is currently back in A-fib. Checked on patient, she is currently resting in bed watching tv with no complaints and in no distress. Paged Dr. Sharl Ma to inform.

## 2017-11-23 NOTE — Progress Notes (Signed)
Patient transferred to 3 east from ED. Patient is alert and oriented. CCMD notified. Admission assessment complete. Patient complaining of pain 8/10 generalized. Tama Gander NP paged. Order received for one time dose of Vicodin. Will initiate order and continue to monitor patient.
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
## 2017-11-24 ENCOUNTER — Inpatient Hospital Stay (HOSPITAL_COMMUNITY): Payer: Medicare Other

## 2017-11-24 DIAGNOSIS — I340 Nonrheumatic mitral (valve) insufficiency: Secondary | ICD-10-CM

## 2017-11-24 DIAGNOSIS — E1165 Type 2 diabetes mellitus with hyperglycemia: Secondary | ICD-10-CM

## 2017-11-24 LAB — GLUCOSE, CAPILLARY
Glucose-Capillary: 116 mg/dL — ABNORMAL HIGH (ref 70–99)
Glucose-Capillary: 147 mg/dL — ABNORMAL HIGH (ref 70–99)
Glucose-Capillary: 126 mg/dL — ABNORMAL HIGH (ref 70–99)
Glucose-Capillary: 163 mg/dL — ABNORMAL HIGH (ref 70–99)

## 2017-11-24 LAB — BASIC METABOLIC PANEL
Anion gap: 11 (ref 5–15)
BUN: 13 mg/dL (ref 8–23)
CO2: 27 mmol/L (ref 22–32)
Calcium: 7.9 mg/dL — ABNORMAL LOW (ref 8.9–10.3)
Chloride: 102 mmol/L (ref 98–111)
Creatinine, Ser: 1.23 mg/dL — ABNORMAL HIGH (ref 0.44–1.00)
GFR calc Af Amer: 51 mL/min — ABNORMAL LOW (ref >60)
GFR calc non Af Amer: 44 mL/min — ABNORMAL LOW (ref >60)
Glucose, Bld: 126 mg/dL — ABNORMAL HIGH (ref 70–99)
Potassium: 3.3 mmol/L — ABNORMAL LOW (ref 3.5–5.1)
Sodium: 140 mmol/L (ref 135–145)

## 2017-11-24 LAB — DIGOXIN LEVEL: Digoxin Level: <0.2 ng/mL — ABNORMAL LOW (ref 0.8–2.0)

## 2017-11-24 LAB — MAGNESIUM: Magnesium: 1.7 mg/dL (ref 1.7–2.4)

## 2017-11-24 LAB — ECHOCARDIOGRAM COMPLETE
Height: 71 in
Weight: 4944 oz

## 2017-11-24 MED ORDER — MAGNESIUM SULFATE IN D5W 1-5 GM/100ML-% IV SOLN
1.0000 g | Freq: Once | INTRAVENOUS | Status: AC
  Administered 2017-11-24: 1 g via INTRAVENOUS
  Filled 2017-11-24: qty 100

## 2017-11-24 MED ORDER — POTASSIUM CHLORIDE 20 MEQ PO PACK
40.0000 meq | PACK | Freq: Two times a day (BID) | ORAL | Status: DC
  Administered 2017-11-24 – 2017-11-29 (×10): 40 meq via ORAL
  Filled 2017-11-24 (×10): qty 2

## 2017-11-24 MED ORDER — POTASSIUM CHLORIDE CRYS ER 20 MEQ PO TBCR: 20.0000 meq | EXTENDED_RELEASE_TABLET | Freq: Once | ORAL | Status: DC

## 2017-11-24 MED ORDER — POTASSIUM CHLORIDE CRYS ER 20 MEQ PO TBCR
40.0000 meq | EXTENDED_RELEASE_TABLET | Freq: Two times a day (BID) | ORAL | Status: DC
  Filled 2017-11-24: qty 2

## 2017-11-24 MED ORDER — POTASSIUM CHLORIDE 20 MEQ PO PACK
40.0000 meq | PACK | Freq: Once | ORAL | Status: AC
  Administered 2017-11-24: 40 meq via ORAL
  Filled 2017-11-24: qty 2

## 2017-11-24 NOTE — Progress Notes (Signed)
0756 Pt needs PT consult as she was limited in mobility prior to admission from pulling back(per note). Please order and we will follow their progress. Luetta Nutting RN BSN 11/24/2017 7:58 AM

## 2017-11-24 NOTE — Progress Notes (Signed)
  Echocardiogram 2D Echocardiogram has been performed.  Delcie Roch 11/24/2017, 9:10 AM

## 2017-11-24 NOTE — Plan of Care (Signed)
  Problem: Education: Goal: Knowledge of General Education information will improve Outcome: Completed/Met   Problem: Nutrition: Goal: Adequate nutrition will be maintained Outcome: Completed/Met

## 2017-11-24 NOTE — Care Management Note (Signed)
Case Management Note  Patient Details  Name: Kaitlyn [name] MRN: [id number] Date of Birth: 1950-09-27  Subjective/Objective:   CHF                Action/Plan: Patient is known to me from previous admissions; PCP - none. CM asked patient if I could assist her in finding a PCP, patient refused, stated I see Dr Shirlee Latch; has private insurance with Medicare; pharmacy of choice is [facility], they deliver medication to her home; DME - walker and wheelchair at her home, she is refusing a bedside commode at this time; she is active with [facility] as prior to admission for [facility] services; CM will continue to follow for progression of care.  Expected Discharge Date:  Possibly 11/28/2017             Expected Discharge Plan:  Home w Home Health Services  Discharge planning Services  CM Consult  HH Arranged:   RN, PT, OT, nurses aide, SW [facility] Agency:  [facility]  Status of Service:  In process, will continue to follow  [name] [phone] 11/24/2017, 2:20 PM

## 2017-11-24 NOTE — Progress Notes (Addendum)
Advanced Heart Failure Rounding Note  PCP-Cardiologist: No primary care provider on file.   Subjective:    Feeling "much" better this am. States at home she was having trouble getting out of her recliner. Denies SOB, lightheadedness or dizziness. At this time.   Marked diuresis noted. Negative 6 L and down 14 lbs. Unclear baseline. Previously as low as 290 in January.   Objective:   Weight Range: (!) 309 lb (140.2 kg) Body mass index is 43.1 kg/m.   Vital Signs:   Temp:  [97.5 F (36.4 C)-97.8 F (36.6 C)] 97.5 F (36.4 C) (07/11 0350) Pulse Rate:  [92-103] 100 (07/11 0350) Resp:  [17-20] 18 (07/11 0350) BP: (107-133)/(72-86) 133/80 (07/11 0350) SpO2:  [90 %-96 %] 91 % (07/11 0350) Weight:  [309 lb (140.2 kg)] 309 lb (140.2 kg) (07/11 0327) Last BM Date: 11/23/17  Weight change: Filed Weights   11/22/17 2155 11/23/17 0212 11/24/17 0327  Weight: (!) 303 lb (137.4 kg) (!) 323 lb 3.1 oz (146.6 kg) (!) 309 lb (140.2 kg)    Intake/Output:   Intake/Output Summary (Last 24 hours) at 11/24/2017 0758 Last data filed at 11/24/2017 0727 Gross per 24 hour  Intake 1120 ml  Output 7500 ml  Net -6380 ml      Physical Exam    General:  NAD. Disheveled.  HEENT: Normal Neck: Supple. JVP 12+ cm. Carotids 2+ bilat; no bruits. No lymphadenopathy or thyromegaly appreciated. Cor: PMI nondisplaced. Regular rate & rhythm. No rubs, gallops or murmurs. Lungs: + basilar crackles. Abdomen: Soft, nontender, nondistended. No hepatosplenomegaly. No bruits or masses. Good bowel sounds. Extremities: No cyanosis, clubbing, or rash. 2+ edema into thighs.  Neuro: Alert & orientedx3, cranial nerves grossly intact. moves all 4 extremities w/o difficulty. Affect pleasant   Telemetry   Afib 90-100s, personally reviewed.  EKG    No new tracings.    Labs    CBC Recent Labs    11/22/17 2317 11/23/17 0236  WBC 8.7 10.0  NEUTROABS 6.6  --   HGB 10.1* 10.3*  HCT 34.9* 35.4*  MCV  118.3* 115.7*  PLT 193 234   Basic Metabolic Panel Recent Labs    16/10/96 2104 11/24/17 0607  NA 141 140  K 3.9 3.3*  CL 105 102  CO2 29 27  GLUCOSE 134* 126*  BUN 13 13  CREATININE 1.18* 1.23*  CALCIUM 8.2* 7.9*  MG 1.9  --    Liver Function Tests Recent Labs    11/23/17 0236  AST 12*  ALT 11  ALKPHOS 86  BILITOT 0.6  PROT 6.6  ALBUMIN 3.1*   No results for input(s): LIPASE, AMYLASE in the last 72 hours. Cardiac Enzymes Recent Labs    11/23/17 0236 11/23/17 1132  TROPONINI <0.03 <0.03    BNP: BNP (last 3 results) Recent Labs    01/07/17 1813 11/22/17 2317  BNP 205.5* 725.4*    ProBNP (last 3 results) No results for input(s): PROBNP in the last 8760 hours.   D-Dimer No results for input(s): DDIMER in the last 72 hours. Hemoglobin A1C No results for input(s): HGBA1C in the last 72 hours. Fasting Lipid Panel No results for input(s): CHOL, HDL, LDLCALC, TRIG, CHOLHDL, LDLDIRECT in the last 72 hours. Thyroid Function Tests Recent Labs    11/23/17 0236  TSH 5.964*    Other results:   Imaging     No results found.   Medications:     Scheduled Medications: . apixaban  5 mg Oral  BID  . digoxin  0.125 mg Oral Daily  . furosemide  80 mg Intravenous BID  . insulin aspart  0-5 Units Subcutaneous QHS  . insulin aspart  0-9 Units Subcutaneous TID WC  . losartan  25 mg Oral Daily  . metoprolol succinate  25 mg Oral BID  . potassium chloride  20 mEq Oral Once  . sodium chloride flush  3 mL Intravenous Q12H     Infusions: . sodium chloride       PRN Medications:  sodium chloride, acetaminophen, sodium chloride flush    Patient Profile   Naly Schwanz is a 67 y.o. female with permanent afib on Eliquis, chronic systolic CHF EF 30-35% (refuses ischemic work up), HTN, DM2, and non-compliance.  She presented with SOB at rest.   Assessment/Plan   1. Acute on chronic systolic CHF: Echo (3/18) with EF 30-35%.   - Cardiomyopathy  of uncertain etiology, probably nonischemic but she has refused cath.   - Volume status markedly elevated.  - Continue lasix 80 mg IV BID.  - Continue Digoxin 0.125 daily. Check level in am.  - Continue Toprol XL 25 mg BID - Continue losartan 25 mg daily.  - Refuses unna boots.  2. Atrial fibrillation, Chronic - Mild RVR on admit, but was off meds.  - BB and dig as above.  - Continue Eliquis 5 mg BID - CHA2DS2/VASc is at least 5.   3. Noncompliance:  - On going pattern of hospital optimization and then no outpatient follow up.  - Ideally would go to SNF but refuses.  - We have been unable to intervene in this pattern despite multiple and varied attempts at outpatient social support.  4. Hypokalemia - K 3.3 Will supp and follow. - Check Mg.  5. Deconditioning - Will ask PT to see.   Medication concerns reviewed with patient and pharmacy team. Barriers identified: Marked non-compliance.  Length of Stay: 1  [name]  11/24/2017, 7:58 AM  Advanced Heart Failure Team Pager [phone] (M-F; 7a - 4p)  Please contact [facility] for night-coverage after hours (4p -7a ) and weekends on [email or web address]  Patient seen with PA, agree with the above note.  She diuresed very well yesterday with IV Lasix, weight down considerably.  She refused potassium this morning.   On exam, JVP 9-10 cm with 1+ edema to knees. Irregular S1S2.   1. Acute on chronic systolic CHF: Echo (3/18) with EF 30-35%.  Cardiomyopathy of uncertain etiology, probably nonischemic but she has refused cath.  She was admitted markedly volume overloaded on exam as she has not been taking Lasix and she follows a high sodium diet.  She has diuresed well on IV Lasix, volume status improving.  - Lasix 80 mg IV bid today and tomorrow am, then transition to Lasix 40 mg po daily. She agrees to take potassium packets this evening.  - Digoxin 0.125 daily.  - Toprol XL 25 mg bid - losartan 25 mg daily.  - Refuses unna  boots.  2. Atrial fibrillation: Mild RVR at admission, had not been taking metoprolol or Eliquis. Chronic.  HR improved on Toprol XL.  - Continue Toprol XL 25 mg bid.  - digoxin 0.125 - Restarted Eliquis 5 mg bid.  3. Noncompliance: She has a pattern of coming in to the hospital for medical optimization then going home and not following up or taking her medications.  She is very limited.  Ideally would be cared for in SNF but refuses.  She has very poor insight into her illness and unfortunately, there does not seem to be a good way to change her pattern.    Heart failure team will sign off as of @TODAY @   HF Medication Recommendations for Home: Lasix 40 mg daily + KCl 20 daily (would use packets and not pills) Digoxin 0.125 daily Toprol XL 25 mg bid Losartan 25 mg daily  We will offer followup in CHF clinic but she has not been willing to come in past.  Ideally would repeat BMET 2 wks after discharge.   [name] 11/24/2017 4:47 PM

## 2017-11-24 NOTE — Progress Notes (Signed)
PT Cancellation Note  Patient Details Name: Kaitlyn [name] MRN: [id number] DOB: 1950/12/31   Cancelled Treatment:    Reason Eval/Treat Not Completed: Patient declined, no reason specified. Pt refusing to participate in PT while admitted. She is only interested in HHPT and will not even agree to an evaluation while in the hospital. Pt does not want PT to return to attempt evaluation while admitted. PT will sign off.   [name] 11/24/2017, 12:33 PM

## 2017-11-24 NOTE — Progress Notes (Signed)
Attempted to assess patient's buttocks three times during my shift but patient refused.

## 2017-11-24 NOTE — Progress Notes (Signed)
Pt declining insulin, PO potassium ("it makes my stomach upset"). Discussed the importance of blood glucose control. Discussed the impact of potassium on cardiac function. Attending Dr. Sharl Ma and Alejandro Mulling of HF made aware.   Leonidas Romberg, RN

## 2017-11-24 NOTE — Progress Notes (Signed)
[facility]  PROGRESS NOTE  Saramarie Mcguffie DVV:[id number] DOB: 04-14-1951 DOA: 11/22/2017 PCP: Laurey Morale, MD   Brief HPI:    67 year old female with a history of hypertension, CHF, EF 15 to 20%, paroxysmal atrial fibrillation, diabetes mellitus, seizure disorder came with dyspnea.  Chest x-ray showed cardiomegaly with mild vascular congestion.  Patient tells me that she stopped taking Lasix a few years back.     Subjective   Patient seen and examined, breathing is improved.  Excellent diuresis with IV Lasix.  Net -9.6 L   Assessment/Plan:     1. Acute systolic CHF-echocardiogram from March 2018 showed EF 30 to 35%, continue Lasix 80 mg IV twice daily.  Patient has clinically improved.  Cardiology following. 2. Hypokalemia-potassium was 3.3,, patient is on potassium supplementation follow BMP in am. 3. A. fib with RVR-continue Eliquis, Toprol-XL 25 mill grams p.o. twice daily, digoxin 0.1 patient had nonsustained V. tach yesterday, 25 mg daily 4. Nonsustained V. Tach-had low potassium and magnesium.  We will replace magnesium with 1 g mag sulfate IV x1.  Potassium to be replaced as above.   5. Diabetes mellitus-Blood glucose is well controlled, continue sliding scale insulin with NovoLog.    DVT prophylaxis: Eliquis  Code Status: Full code  Family Communication: No family at bedside  Disposition Plan: likely home when medically ready for discharge   Consultants:  Cardiology  Procedures:  None   Antibiotics:   Anti-infectives (From admission, onward)   None       Objective   Vitals:   11/24/17 0102 11/24/17 0327 11/24/17 0350 11/24/17 1207  BP: 123/86  133/80 126/87  Pulse: 100  100 91  Resp: 18  18   Temp: 97.8 F (36.6 C)  (!) 97.5 F (36.4 C) 97.8 F (36.6 C)  TempSrc: Oral  Oral Oral  SpO2: 90%  91% 93%  Weight:  (!) 140.2 kg (309 lb)    Height:        Intake/Output Summary (Last 24 hours) at 11/24/2017 1400 Last data filed at  11/24/2017 1324 Gross per 24 hour  Intake 740.69 ml  Output 8275 ml  Net -7534.31 ml   Filed Weights   11/22/17 2155 11/23/17 0212 11/24/17 0327  Weight: (!) 137.4 kg (303 lb) (!) 146.6 kg (323 lb 3.1 oz) (!) 140.2 kg (309 lb)     Physical Examination:    General: Appears in no acute distress   Cardiovascular: S1-S2, regular  Respiratory: Clear bilaterally  Abdomen: Soft, nontender, no organomegaly  Extremities: 1+ pitting edema edema of the lower extremities bilaterally  Neurologic:  Alert, oriented x3     Data Reviewed: I have personally reviewed following labs and imaging studies  CBG: Recent Labs  Lab 11/23/17 1135 11/23/17 1607 11/23/17 2115 11/24/17 0748 11/24/17 1203  GLUCAP 110* 123* 134* 116* 147*    CBC: Recent Labs  Lab 11/19/17 1000 11/22/17 2317 11/23/17 0236  WBC 7.4 8.7 10.0  NEUTROABS 5.7 6.6  --   HGB 9.8* 10.1* 10.3*  HCT 33.7* 34.9* 35.4*  MCV 117.8* 118.3* 115.7*  PLT 174 193 234    Basic Metabolic Panel: Recent Labs  Lab 11/19/17 1000 11/22/17 2317 11/23/17 0236 11/23/17 2104 11/24/17 0606 11/24/17 0607  NA 140 142 142 141  --  140  K 3.7 3.4* 3.5 3.9  --  3.3*  CL 108 110 107 105  --  102  CO2 24 24 24 29   --  27  GLUCOSE 135* 110* 116*  134*  --  126*  BUN 11 11 11 13   --  13  CREATININE 0.97 0.89 0.97 1.18*  --  1.23*  CALCIUM 8.0* 7.7* 8.3* 8.2*  --  7.9*  MG  --   --   --  1.9 1.7  --     Recent Results (from the past 240 hour(s))  Urine Culture     Status: None   Collection Time: 11/19/17 11:40 AM  Result Value Ref Range Status   Specimen Description URINE, CLEAN CATCH  Final   Special Requests NONE  Final   Culture   Final    NO GROWTH Performed at [facility], [address], [place], [place] [address]    Report Status 11/20/2017 FINAL  Final     Liver Function Tests: Recent Labs  Lab 11/23/17 0236  AST 12*  ALT 11  ALKPHOS 86  BILITOT 0.6  PROT 6.6  ALBUMIN 3.1*   No results for  input(s): LIPASE, AMYLASE in the last 168 hours. No results for input(s): AMMONIA in the last 168 hours.  Cardiac Enzymes: Recent Labs  Lab 11/23/17 0236 11/23/17 1132  TROPONINI <0.03 <0.03   BNP (last 3 results) Recent Labs    01/07/17 1813 11/22/17 2317  BNP 205.5* 725.4*    ProBNP (last 3 results) No results for input(s): PROBNP in the last 8760 hours.    Studies: Dg Chest 2 View  Result Date: 11/22/2017 CLINICAL DATA:  Dyspnea EXAM: CHEST - 2 VIEW COMPARISON:  05/30/2017 FINDINGS: Stable cardiomegaly with tortuous atherosclerotic aorta. Mild diffuse pulmonary vascular congestion is noted with minimal atelectasis at the left lung base. No pulmonary consolidation is noted. Probable hair artifact projects over the left supraclavicular region. No acute osseous abnormality. IMPRESSION: Cardiomegaly with mild vascular congestion. Minimal aortic atherosclerosis without aneurysm. Electronically Signed   By: Tollie Eth M.D.   On: 11/22/2017 22:30    Scheduled Meds: . apixaban  5 mg Oral BID  . digoxin  0.125 mg Oral Daily  . furosemide  80 mg Intravenous BID  . insulin aspart  0-5 Units Subcutaneous QHS  . insulin aspart  0-9 Units Subcutaneous TID WC  . losartan  25 mg Oral Daily  . metoprolol succinate  25 mg Oral BID  . potassium chloride  20 mEq Oral Once  . potassium chloride  40 mEq Oral BID  . sodium chloride flush  3 mL Intravenous Q12H      Time spent: 20 min  Meredeth Ide   [facility] Pager [phone]. If 7PM-7AM, please contact night-coverage at [email or web address], Office  [phone]  password [email or web address]  11/24/2017, 2:00 PM  LOS: 1 day

## 2017-11-25 LAB — GLUCOSE, CAPILLARY
Glucose-Capillary: 110 mg/dL — ABNORMAL HIGH (ref 70–99)
Glucose-Capillary: 147 mg/dL — ABNORMAL HIGH (ref 70–99)
Glucose-Capillary: 110 mg/dL — ABNORMAL HIGH (ref 70–99)
Glucose-Capillary: 149 mg/dL — ABNORMAL HIGH (ref 70–99)

## 2017-11-25 LAB — BASIC METABOLIC PANEL
Anion gap: 11 (ref 5–15)
BUN: 14 mg/dL (ref 8–23)
CO2: 30 mmol/L (ref 22–32)
Calcium: 8 mg/dL — ABNORMAL LOW (ref 8.9–10.3)
Chloride: 100 mmol/L (ref 98–111)
Creatinine, Ser: 1.2 mg/dL — ABNORMAL HIGH (ref 0.44–1.00)
GFR calc Af Amer: 53 mL/min — ABNORMAL LOW (ref >60)
GFR calc non Af Amer: 46 mL/min — ABNORMAL LOW (ref >60)
Glucose, Bld: 118 mg/dL — ABNORMAL HIGH (ref 70–99)
Potassium: 3.8 mmol/L (ref 3.5–5.1)
Sodium: 141 mmol/L (ref 135–145)

## 2017-11-25 LAB — MAGNESIUM: Magnesium: 2 mg/dL (ref 1.7–2.4)

## 2017-11-25 MED ORDER — TRAMADOL HCL 50 MG PO TABS
50.0000 mg | ORAL_TABLET | Freq: Four times a day (QID) | ORAL | Status: DC | PRN
  Administered 2017-11-25 – 2017-11-29 (×7): 50 mg via ORAL
  Filled 2017-11-25 (×8): qty 1

## 2017-11-25 NOTE — Progress Notes (Signed)
Patient's fall risk score is high and she refuses bed and chair alarms.  She is alert and oriented x 4.  She accepted yellow arm band and yellow socks as fall risk interventions for her safety.

## 2017-11-25 NOTE — Progress Notes (Signed)
Pt refusing bed alarm. Educated and still refuses.

## 2017-11-25 NOTE — Progress Notes (Signed)
[facility]  PROGRESS NOTE  Kaitlyn [name] [id number]:[id number] DOB: [birth year] DOA: 11/22/2017 PCP: Laurey Morale, MD   Brief HPI:    67 year old female with a history of hypertension, CHF, EF 15 to 20%, paroxysmal atrial fibrillation, diabetes mellitus, seizure disorder came with dyspnea.  Chest x-ray showed cardiomegaly with mild vascular congestion.  Patient tells me that she stopped taking Lasix a few years back.    Subjective   Patient seen and examined, continue to have good diuresis with IV Lasix.  net -14 L net -14 L   Assessment/Plan:     1. Acute systolic CHF-echocardiogram from March 2018 showed EF 30 to 35%, continue Lasix 80 mg IV twice daily.  Patient has clinically improved.  Cardiology following. 2. Hypokalemia-replete, today potassium is 3.9 3. A. fib with RVR-continue Eliquis, Toprol-XL 25 mill grams p.o. twice daily, digoxin 0.1 patient had nonsustained V. tach yesterday, 25 mg daily 4. Nonsustained V. Tach-resolved, patient had low magnesium potassium which has been repleted 5. Diabetes mellitus-continue sliding scale insulin with NovoLog.  Blood glucose is well controlled.    DVT prophylaxis: Eliquis  Code Status: Full code  Family Communication: No family at bedside  Disposition Plan: likely home when medically ready for discharge   Consultants:  Cardiology  Procedures:  None   Antibiotics:   Anti-infectives (From admission, onward)   None       Objective   Vitals:   11/25/17 0200 11/25/17 0538 11/25/17 1005 11/25/17 1155  BP: 129/90 119/83 116/67 95/65  Pulse: 90 96 100 95  Resp:  18    Temp:  97.7 F (36.5 C)  98.1 F (36.7 C)  TempSrc:  Oral  Oral  SpO2:  91%  92%  Weight:  134.9 kg (297 lb 4.8 oz)    Height:        Intake/Output Summary (Last 24 hours) at 11/25/2017 1348 Last data filed at 11/25/2017 1235 Gross per 24 hour  Intake 900 ml  Output 6050 ml  Net -5150 ml   Filed Weights   11/23/17 0212 11/24/17 0327  11/25/17 0538  Weight: (!) 146.6 kg (323 lb 3.1 oz) (!) 140.2 kg (309 lb) 134.9 kg (297 lb 4.8 oz)     Physical Examination:   Lungs: Normal respiratory effort, bilateral clear to auscultation, no crackles or wheezes.  Heart: Regular rate and rhythm, S1 and S2 normal, no murmurs, rubs auscultated Abdomen: BS normoactive,soft,nondistended,non-tender to palpation,no organomegaly Extremities: Bilateral 2+ pitting edema of the lower extremities Neuro : Alert and oriented to time, place and person, No focal deficits     Data Reviewed: I have personally reviewed following labs and imaging studies  CBG: Recent Labs  Lab 11/24/17 1203 11/24/17 1617 11/24/17 2131 11/25/17 0726 11/25/17 1131  GLUCAP 147* 126* 163* 110* 147*    CBC: Recent Labs  Lab 11/19/17 1000 11/22/17 2317 11/23/17 0236  WBC 7.4 8.7 10.0  NEUTROABS 5.7 6.6  --   HGB 9.8* 10.1* 10.3*  HCT 33.7* 34.9* 35.4*  MCV 117.8* 118.3* 115.7*  PLT 174 193 234    Basic Metabolic Panel: Recent Labs  Lab 11/22/17 2317 11/23/17 0236 11/23/17 2104 11/24/17 0606 11/24/17 0607 11/25/17 0246  NA 142 142 141  --  140 141  K 3.4* 3.5 3.9  --  3.3* 3.8  CL 110 107 105  --  102 100  CO2 24 24 29   --  27 30  GLUCOSE 110* 116* 134*  --  126* 118*  BUN 11  11 13  --  13 14  CREATININE 0.89 0.97 1.18*  --  1.23* 1.20*  CALCIUM 7.7* 8.3* 8.2*  --  7.9* 8.0*  MG  --   --  1.9 1.7  --  2.0    Recent Results (from the past 240 hour(s))  Urine Culture     Status: None   Collection Time: 11/19/17 11:40 AM  Result Value Ref Range Status   Specimen Description URINE, CLEAN CATCH  Final   Special Requests NONE  Final   Culture   Final    NO GROWTH Performed at [facility], [address] [place], [place] [address]    Report Status 11/20/2017 FINAL  Final     Liver Function Tests: Recent Labs  Lab 11/23/17 0236  AST 12*  ALT 11  ALKPHOS 86  BILITOT 0.6  PROT 6.6  ALBUMIN 3.1*   No results for  input(s): LIPASE, AMYLASE in the last 168 hours. No results for input(s): AMMONIA in the last 168 hours.  Cardiac Enzymes: Recent Labs  Lab 11/23/17 0236 11/23/17 1132  TROPONINI <0.03 <0.03   BNP (last 3 results) Recent Labs    01/07/17 1813 11/22/17 2317  BNP 205.5* 725.4*    ProBNP (last 3 results) No results for input(s): PROBNP in the last 8760 hours.    Studies: No results found.  Scheduled Meds: . apixaban  5 mg Oral BID  . digoxin  0.125 mg Oral Daily  . furosemide  80 mg Intravenous BID  . insulin aspart  0-5 Units Subcutaneous QHS  . insulin aspart  0-9 Units Subcutaneous TID WC  . losartan  25 mg Oral Daily  . metoprolol succinate  25 mg Oral BID  . potassium chloride  40 mEq Oral BID  . sodium chloride flush  3 mL Intravenous Q12H      Time spent: 20 min  Meredeth Ide   [facility] Pager [phone]. If 7PM-7AM, please contact night-coverage at [email or web address], Office  [phone]  password [email or web address]  11/25/2017, 1:47 PM  LOS: 2 days

## 2017-11-26 LAB — BASIC METABOLIC PANEL
Anion gap: 7 (ref 5–15)
BUN: 15 mg/dL (ref 8–23)
CO2: 31 mmol/L (ref 22–32)
Calcium: 8.4 mg/dL — ABNORMAL LOW (ref 8.9–10.3)
Chloride: 100 mmol/L (ref 98–111)
Creatinine, Ser: 1.21 mg/dL — ABNORMAL HIGH (ref 0.44–1.00)
GFR calc Af Amer: 52 mL/min — ABNORMAL LOW (ref >60)
GFR calc non Af Amer: 45 mL/min — ABNORMAL LOW (ref >60)
Glucose, Bld: 131 mg/dL — ABNORMAL HIGH (ref 70–99)
Potassium: 4.2 mmol/L (ref 3.5–5.1)
Sodium: 138 mmol/L (ref 135–145)

## 2017-11-26 LAB — GLUCOSE, CAPILLARY
Glucose-Capillary: 128 mg/dL — ABNORMAL HIGH (ref 70–99)
Glucose-Capillary: 143 mg/dL — ABNORMAL HIGH (ref 70–99)
Glucose-Capillary: 127 mg/dL — ABNORMAL HIGH (ref 70–99)
Glucose-Capillary: 124 mg/dL — ABNORMAL HIGH (ref 70–99)

## 2017-11-26 LAB — MAGNESIUM: Magnesium: 1.9 mg/dL (ref 1.7–2.4)

## 2017-11-26 NOTE — Progress Notes (Addendum)
Patient requests to not receive her lasix until she speaks with MD regarding the continuing the high dose; as she would like to decrease her dose as she feels shes been on it long enough.    Patient also refuses her insulin this morning.

## 2017-11-26 NOTE — Progress Notes (Signed)
[facility]  PROGRESS NOTE  Kaitlyn [name] [id number]:[id number] DOB: 1951-04-28 DOA: 11/22/2017 PCP: Laurey Morale, MD   Brief HPI:    67 year old female with a history of hypertension, CHF, EF 15 to 20%, paroxysmal atrial fibrillation, diabetes mellitus, seizure disorder came with dyspnea.  Chest x-ray showed cardiomegaly with mild vascular congestion.  Patient tells me that she stopped taking Lasix a few years back.    Subjective   Patient seen and examined, continues to have excellent diuresis with IV Lasix.  Wants to decrease the dose of Lasix, so that she does not have to urinate as much.   Assessment/Plan:     1. Acute systolic CHF-significant improvement with IV Lasix echocardiogram from March 2018 showed EF 30 to 35%, continue Lasix 80 mg IV twice daily.  Patient has clinically improved.  Cardiology following.  Net -20 L. 2. Hypokalemia-replete, today potassium is 4.2 3. A. fib with RVR-continue Eliquis, Toprol-XL 25 mill grams p.o. twice daily, digoxin 0.125 mg daily. 4. Nonsustained V. Tach-resolved, patient had low magnesium, potassium which has been repleted. 5. Diabetes mellitus-continue sliding scale insulin with NovoLog.  Blood glucose is well controlled.    DVT prophylaxis: Eliquis  Code Status: Full code  Family Communication: No family at bedside  Disposition Plan: likely home when medically ready for discharge   Consultants:  Cardiology  Procedures:  None   Antibiotics:   Anti-infectives (From admission, onward)   None       Objective   Vitals:   11/25/17 2154 11/26/17 0633 11/26/17 1034 11/26/17 1222  BP: 126/80 122/80 108/62 (!) 119/98  Pulse: 95 90 96 (!) 106  Resp:  18  20  Temp:  98.1 F (36.7 C)  97.7 F (36.5 C)  TempSrc:  Oral  Oral  SpO2:  91%  (!) 89%  Weight:  129.4 kg (285 lb 4.4 oz)    Height:        Intake/Output Summary (Last 24 hours) at 11/26/2017 1313 Last data filed at 11/26/2017 1301 Gross per 24 hour   Intake 720 ml  Output 5950 ml  Net -5230 ml   Filed Weights   11/24/17 0327 11/25/17 0538 11/26/17 1914  Weight: (!) 140.2 kg (309 lb) 134.9 kg (297 lb 4.8 oz) 129.4 kg (285 lb 4.4 oz)     Physical Examination:   Lungs: Normal respiratory effort, bilateral clear to auscultation, no crackles or wheezes.  Heart: Regular rate and rhythm, S1 and S2 normal, no murmurs, rubs auscultated Abdomen: BS normoactive,soft,nondistended,non-tender to palpation,no organomegaly Extremities: Bilateral 2+ pitting edema of the lower extremities Neuro : Alert and oriented to time, place and person, No focal deficits      Data Reviewed: I have personally reviewed following labs and imaging studies  CBG: Recent Labs  Lab 11/25/17 1131 11/25/17 1652 11/25/17 2111 11/26/17 0729 11/26/17 1137  GLUCAP 147* 110* 149* 128* 143*    CBC: Recent Labs  Lab 11/22/17 2317 11/23/17 0236  WBC 8.7 10.0  NEUTROABS 6.6  --   HGB 10.1* 10.3*  HCT 34.9* 35.4*  MCV 118.3* 115.7*  PLT 193 234    Basic Metabolic Panel: Recent Labs  Lab 11/23/17 0236 11/23/17 2104 11/24/17 0606 11/24/17 0607 11/25/17 0246 11/26/17 0549  NA 142 141  --  140 141 138  K 3.5 3.9  --  3.3* 3.8 4.2  CL 107 105  --  102 100 100  CO2 24 29  --  27 30 31   GLUCOSE 116* 134*  --  126* 118* 131*  BUN 11 13  --  13 14 15   CREATININE 0.97 1.18*  --  1.23* 1.20* 1.21*  CALCIUM 8.3* 8.2*  --  7.9* 8.0* 8.4*  MG  --  1.9 1.7  --  2.0 1.9    Recent Results (from the past 240 hour(s))  Urine Culture     Status: None   Collection Time: 11/19/17 11:40 AM  Result Value Ref Range Status   Specimen Description URINE, CLEAN CATCH  Final   Special Requests NONE  Final   Culture   Final    NO GROWTH Performed at [facility], [address], [place], [place] [address]    Report Status 11/20/2017 FINAL  Final     Liver Function Tests: Recent Labs  Lab 11/23/17 0236  AST 12*  ALT 11  ALKPHOS 86  BILITOT 0.6   PROT 6.6  ALBUMIN 3.1*   No results for input(s): LIPASE, AMYLASE in the last 168 hours. No results for input(s): AMMONIA in the last 168 hours.  Cardiac Enzymes: Recent Labs  Lab 11/23/17 0236 11/23/17 1132  TROPONINI <0.03 <0.03   BNP (last 3 results) Recent Labs    01/07/17 1813 11/22/17 2317  BNP 205.5* 725.4*    ProBNP (last 3 results) No results for input(s): PROBNP in the last 8760 hours.    Studies: No results found.  Scheduled Meds: . apixaban  5 mg Oral BID  . digoxin  0.125 mg Oral Daily  . furosemide  80 mg Intravenous BID  . insulin aspart  0-5 Units Subcutaneous QHS  . insulin aspart  0-9 Units Subcutaneous TID WC  . losartan  25 mg Oral Daily  . metoprolol succinate  25 mg Oral BID  . potassium chloride  40 mEq Oral BID  . sodium chloride flush  3 mL Intravenous Q12H      Time spent: 20 min  Meredeth Ide   [facility] Pager [phone]. If 7PM-7AM, please contact night-coverage at [email or web address], Office  [phone]  password [email or web address]  11/26/2017, 1:13 PM  LOS: 3 days

## 2017-11-26 NOTE — Progress Notes (Signed)
Patient resting comfortably during shift report. Denies complaints.  

## 2017-11-26 NOTE — Plan of Care (Signed)
  Problem: Education: Goal: Ability to verbalize understanding of medication therapies will improve Outcome: Not Progressing   Problem: Pain Managment: Goal: General experience of comfort will improve Outcome: Progressing   Problem: Activity: Goal: Risk for activity intolerance will decrease Outcome: Progressing

## 2017-11-27 LAB — GLUCOSE, CAPILLARY
Glucose-Capillary: 128 mg/dL — ABNORMAL HIGH (ref 70–99)
Glucose-Capillary: 152 mg/dL — ABNORMAL HIGH (ref 70–99)
Glucose-Capillary: 143 mg/dL — ABNORMAL HIGH (ref 70–99)
Glucose-Capillary: 145 mg/dL — ABNORMAL HIGH (ref 70–99)

## 2017-11-27 LAB — BASIC METABOLIC PANEL
Anion gap: 6 (ref 5–15)
BUN: 17 mg/dL (ref 8–23)
CO2: 30 mmol/L (ref 22–32)
Calcium: 8.7 mg/dL — ABNORMAL LOW (ref 8.9–10.3)
Chloride: 102 mmol/L (ref 98–111)
Creatinine, Ser: 1.14 mg/dL — ABNORMAL HIGH (ref 0.44–1.00)
GFR calc Af Amer: 56 mL/min — ABNORMAL LOW (ref >60)
GFR calc non Af Amer: 49 mL/min — ABNORMAL LOW (ref >60)
Glucose, Bld: 146 mg/dL — ABNORMAL HIGH (ref 70–99)
Potassium: 4.5 mmol/L (ref 3.5–5.1)
Sodium: 138 mmol/L (ref 135–145)

## 2017-11-27 LAB — MAGNESIUM: Magnesium: 2 mg/dL (ref 1.7–2.4)

## 2017-11-27 MED ORDER — FUROSEMIDE 10 MG/ML IJ SOLN
60.0000 mg | Freq: Two times a day (BID) | INTRAMUSCULAR | Status: DC
  Administered 2017-11-27 – 2017-11-28 (×3): 60 mg via INTRAVENOUS
  Filled 2017-11-27 (×3): qty 6

## 2017-11-27 NOTE — Progress Notes (Signed)
[facility]  PROGRESS NOTE  Kaitlyn [name] ZJQ:[id number] DOB: 14-Oct-1950 DOA: 11/22/2017 PCP: Laurey Morale, MD   Brief HPI:    67 year old female with a history of hypertension, CHF, EF 15 to 20%, paroxysmal atrial fibrillation, diabetes mellitus, seizure disorder came with dyspnea.  Chest x-ray showed cardiomegaly with mild vascular congestion.  Patient tells me that she stopped taking Lasix a few years back.    Subjective   Patient seen and examined, refused Lasix last night wants the dose to be reduced.   Assessment/Plan:     1. Acute systolic CHF-echocardiogram from March 2018 showed EF 30 to 35%, patient was started on IV Lasix 80 mg daily hour and diuresed very well, net -23 L.  Will change Lasix to 60 mg IV every 12 hours.  Follow BMP in am.  Will cardiology following. 2. Hypokalemia-replete with  3. A. fib with RVR-continue Eliquis, Toprol-XL 25 mill grams p.o. twice daily, digoxin 0.1 patient had nonsustained V. tach yesterday, 25 mg daily 4. Nonsustained V. Tach-resolved, patient had low magnesium potassium which has been repleted 5. Diabetes mellitus-continue sliding scale insulin with NovoLog.  Blood glucose is well controlled.    DVT prophylaxis: Eliquis  Code Status: Full code  Family Communication: No family at bedside  Disposition Plan: likely home when medically ready for discharge   Consultants:  Cardiology  Procedures:  None   Antibiotics:   Anti-infectives (From admission, onward)   None       Objective   Vitals:   11/27/17 0442 11/27/17 0442 11/27/17 1029 11/27/17 1120  BP:  115/81 132/87 105/77  Pulse:  98 91 93  Resp:  18  18  Temp:  98.6 F (37 C)  97.7 F (36.5 C)  TempSrc:  Oral  Oral  SpO2:  91%  96%  Weight: 126.6 kg (279 lb 3.2 oz)     Height:        Intake/Output Summary (Last 24 hours) at 11/27/2017 1254 Last data filed at 11/27/2017 1119 Gross per 24 hour  Intake 1620 ml  Output 5500 ml  Net -3880 ml    Filed Weights   11/25/17 0538 11/26/17 0633 11/27/17 0442  Weight: 134.9 kg (297 lb 4.8 oz) 129.4 kg (285 lb 4.4 oz) 126.6 kg (279 lb 3.2 oz)     Physical Examination:  Physical Exam: Eyes: No icterus, extraocular muscles intact  Mouth: Oral mucosa is moist, no lesions on palate,  Neck: Supple, no deformities, masses, or tenderness Lungs: Normal respiratory effort, bilateral clear to auscultation, no crackles or wheezes.  Heart: Regular rate and rhythm, S1 and S2 normal, no murmurs, rubs auscultated Abdomen: BS normoactive,soft,nondistended,non-tender to palpation,no organomegaly Extremities: Bilateral 2+ pitting edema of the lower extremities Neuro : Alert and oriented to time, place and person, No focal deficits      Data Reviewed: I have personally reviewed following labs and imaging studies  CBG: Recent Labs  Lab 11/26/17 1137 11/26/17 1622 11/26/17 2100 11/27/17 0803 11/27/17 1117  GLUCAP 143* 127* 124* 128* 152*    CBC: Recent Labs  Lab 11/22/17 2317 11/23/17 0236  WBC 8.7 10.0  NEUTROABS 6.6  --   HGB 10.1* 10.3*  HCT 34.9* 35.4*  MCV 118.3* 115.7*  PLT 193 234    Basic Metabolic Panel: Recent Labs  Lab 11/23/17 2104 11/24/17 0606 11/24/17 0607 11/25/17 0246 11/26/17 0549 11/27/17 0633  NA 141  --  140 141 138 138  K 3.9  --  3.3* 3.8 4.2 4.5  CL 105  --  102 100 100 102  CO2 29  --  27 30 31 30   GLUCOSE 134*  --  126* 118* 131* 146*  BUN 13  --  13 14 15 17   CREATININE 1.18*  --  1.23* 1.20* 1.21* 1.14*  CALCIUM 8.2*  --  7.9* 8.0* 8.4* 8.7*  MG 1.9 1.7  --  2.0 1.9 2.0    Recent Results (from the past 240 hour(s))  Urine Culture     Status: None   Collection Time: 11/19/17 11:40 AM  Result Value Ref Range Status   Specimen Description URINE, CLEAN CATCH  Final   Special Requests NONE  Final   Culture   Final    NO GROWTH Performed at [facility], [address] [place], [place] [address]    Report Status 11/20/2017  FINAL  Final     Liver Function Tests: Recent Labs  Lab 11/23/17 0236  AST 12*  ALT 11  ALKPHOS 86  BILITOT 0.6  PROT 6.6  ALBUMIN 3.1*   No results for input(s): LIPASE, AMYLASE in the last 168 hours. No results for input(s): AMMONIA in the last 168 hours.  Cardiac Enzymes: Recent Labs  Lab 11/23/17 0236 11/23/17 1132  TROPONINI <0.03 <0.03   BNP (last 3 results) Recent Labs    01/07/17 1813 11/22/17 2317  BNP 205.5* 725.4*    ProBNP (last 3 results) No results for input(s): PROBNP in the last 8760 hours.    Studies: No results found.  Scheduled Meds: . apixaban  5 mg Oral BID  . digoxin  0.125 mg Oral Daily  . furosemide  60 mg Intravenous BID  . insulin aspart  0-5 Units Subcutaneous QHS  . insulin aspart  0-9 Units Subcutaneous TID WC  . losartan  25 mg Oral Daily  . metoprolol succinate  25 mg Oral BID  . potassium chloride  40 mEq Oral BID  . sodium chloride flush  3 mL Intravenous Q12H      Time spent: 20 min  Meredeth Ide   [facility] Pager [phone]. If 7PM-7AM, please contact night-coverage at [email or web address], Office  [phone]  password [email or web address]  11/27/2017, 12:54 PM  LOS: 4 days

## 2017-11-27 NOTE — Progress Notes (Signed)
Pt refused foam bandage placement on buttocks when offered. Notable excoriation/MASD/early stage wounds to buttocks, but would not allow treatment when offered today.

## 2017-11-27 NOTE — Progress Notes (Signed)
Patient resting comfortably during shift report. Denies complaints.  

## 2017-11-27 NOTE — Progress Notes (Signed)
Pt refuses bath.

## 2017-11-28 LAB — BASIC METABOLIC PANEL
Anion gap: 10 (ref 5–15)
BUN: 19 mg/dL (ref 8–23)
CO2: 27 mmol/L (ref 22–32)
Calcium: 8.7 mg/dL — ABNORMAL LOW (ref 8.9–10.3)
Chloride: 99 mmol/L (ref 98–111)
Creatinine, Ser: 1.24 mg/dL — ABNORMAL HIGH (ref 0.44–1.00)
GFR calc Af Amer: 51 mL/min — ABNORMAL LOW (ref >60)
GFR calc non Af Amer: 44 mL/min — ABNORMAL LOW (ref >60)
Glucose, Bld: 125 mg/dL — ABNORMAL HIGH (ref 70–99)
Potassium: 4.3 mmol/L (ref 3.5–5.1)
Sodium: 136 mmol/L (ref 135–145)

## 2017-11-28 LAB — GLUCOSE, CAPILLARY
Glucose-Capillary: 125 mg/dL — ABNORMAL HIGH (ref 70–99)
Glucose-Capillary: 114 mg/dL — ABNORMAL HIGH (ref 70–99)
Glucose-Capillary: 159 mg/dL — ABNORMAL HIGH (ref 70–99)
Glucose-Capillary: 108 mg/dL — ABNORMAL HIGH (ref 70–99)

## 2017-11-28 LAB — MAGNESIUM: Magnesium: 2 mg/dL (ref 1.7–2.4)

## 2017-11-28 MED ORDER — FUROSEMIDE 40 MG PO TABS: 40.0000 mg | ORAL_TABLET | Freq: Two times a day (BID) | ORAL | Status: DC

## 2017-11-28 MED ORDER — FUROSEMIDE 40 MG PO TABS
40.0000 mg | ORAL_TABLET | Freq: Every day | ORAL | Status: DC
  Administered 2017-11-29: 40 mg via ORAL
  Filled 2017-11-28: qty 1

## 2017-11-28 NOTE — Progress Notes (Signed)
[facility]  PROGRESS NOTE  Kaitlyn [name] ZOX:[id number] DOB: [birth year] DOA: 11/22/2017 PCP: Laurey Morale, MD   Brief HPI:    67 year old female with a history of hypertension, CHF, EF 15 to 20%, paroxysmal atrial fibrillation, diabetes mellitus, seizure disorder came with dyspnea.  Chest x-ray showed cardiomegaly with mild vascular congestion.  Patient tells me that she stopped taking Lasix a few years back.    Subjective     Patient seen and examined, denies shortness of breath.  Excellent diuresis with IV Lasix.   Assessment/Plan:     1. Acute systolic CHF-echocardiogram from March 2018 showed EF 30 to 35%, patient was started on IV Lasix 80 mg daily hour and diuresed very well, net -27 L.  Lasix was changed to 60 mg IV every 12 hours.  Will start Lasix 40 mg p.o. daily from tomorrow morning, cardiology has signed off and recommendation to start 40 mg daily at discharge.  Patient has been very noncompliant with her medications. 2. Hypokalemia-replete  3. A. fib with RVR-continue Eliquis, Toprol-XL 25 mill grams p.o. twice daily, digoxin 0.1 patient had nonsustained V. tach during the hospital stay continue Toprol-XL 25 mg daily 4. Nonsustained V. Tach-resolved, patient had low magnesium potassium which has been repleted 5. Diabetes mellitus-continue sliding scale insulin with NovoLog.  Blood glucose is well controlled.    DVT prophylaxis: Eliquis  Code Status: Full code  Family Communication: No family at bedside  Disposition Plan: likely home when medically ready for discharge   Consultants:  Cardiology  Procedures:  None   Antibiotics:   Anti-infectives (From admission, onward)   None       Objective   Vitals:   11/28/17 0433 11/28/17 0654 11/28/17 0836 11/28/17 1211  BP: 109/63  110/72 104/71  Pulse: 91   93  Resp: 18  18 18   Temp: 97.7 F (36.5 C)  97.8 F (36.6 C) 98 F (36.7 C)  TempSrc: Oral  Oral Oral  SpO2: 92%  91% 96%   Weight:  122.9 kg (270 lb 14.4 oz)    Height:        Intake/Output Summary (Last 24 hours) at 11/28/2017 1338 Last data filed at 11/28/2017 1239 Gross per 24 hour  Intake 1320 ml  Output 5500 ml  Net -4180 ml   Filed Weights   11/26/17 0633 11/27/17 0442 11/28/17 0654  Weight: 129.4 kg (285 lb 4.4 oz) 126.6 kg (279 lb 3.2 oz) 122.9 kg (270 lb 14.4 oz)     Physical Examination:    General:  Appears in no acute distress  Cardiovascular:  S1-S2 regular  Respiratory: Clear to auscultation bilaterally.  Abdomen: Soft, nontender, no organomegaly  Musculoskeletal: 1+ pitting edema bilaterally in the lower extremities       Data Reviewed: I have personally reviewed following labs and imaging studies  CBG: Recent Labs  Lab 11/27/17 1117 11/27/17 1640 11/27/17 2049 11/28/17 0727 11/28/17 1157  GLUCAP 152* 143* 145* 125* 114*    CBC: Recent Labs  Lab 11/22/17 2317 11/23/17 0236  WBC 8.7 10.0  NEUTROABS 6.6  --   HGB 10.1* 10.3*  HCT 34.9* 35.4*  MCV 118.3* 115.7*  PLT 193 234    Basic Metabolic Panel: Recent Labs  Lab 11/24/17 0606 11/24/17 0607 11/25/17 0246 11/26/17 0549 11/27/17 0633 11/28/17 0558  NA  --  140 141 138 138 136  K  --  3.3* 3.8 4.2 4.5 4.3  CL  --  102 100 100 102 99  CO2  --  27 30 31 30 27   GLUCOSE  --  126* 118* 131* 146* 125*  BUN  --  13 14 15 17 19   CREATININE  --  1.23* 1.20* 1.21* 1.14* 1.24*  CALCIUM  --  7.9* 8.0* 8.4* 8.7* 8.7*  MG 1.7  --  2.0 1.9 2.0 2.0    Recent Results (from the past 240 hour(s))  Urine Culture     Status: None   Collection Time: 11/19/17 11:40 AM  Result Value Ref Range Status   Specimen Description URINE, CLEAN CATCH  Final   Special Requests NONE  Final   Culture   Final    NO GROWTH Performed at [facility], [address] [place], [place] [address]    Report Status 11/20/2017 FINAL  Final     Liver Function Tests: Recent Labs  Lab 11/23/17 0236  AST 12*  ALT 11   ALKPHOS 86  BILITOT 0.6  PROT 6.6  ALBUMIN 3.1*   No results for input(s): LIPASE, AMYLASE in the last 168 hours. No results for input(s): AMMONIA in the last 168 hours.  Cardiac Enzymes: Recent Labs  Lab 11/23/17 0236 11/23/17 1132  TROPONINI <0.03 <0.03   BNP (last 3 results) Recent Labs    01/07/17 1813 11/22/17 2317  BNP 205.5* 725.4*    ProBNP (last 3 results) No results for input(s): PROBNP in the last 8760 hours.    Studies: No results found.  Scheduled Meds: . apixaban  5 mg Oral BID  . digoxin  0.125 mg Oral Daily  . furosemide  60 mg Intravenous BID  . insulin aspart  0-5 Units Subcutaneous QHS  . insulin aspart  0-9 Units Subcutaneous TID WC  . losartan  25 mg Oral Daily  . metoprolol succinate  25 mg Oral BID  . potassium chloride  40 mEq Oral BID  . sodium chloride flush  3 mL Intravenous Q12H      Time spent: 20 min  Meredeth Ide   [facility] Pager [phone]. If 7PM-7AM, please contact night-coverage at [email or web address], Office  [phone]  password [email or web address]  11/28/2017, 1:38 PM  LOS: 5 days

## 2017-11-28 NOTE — Plan of Care (Signed)
?  Problem: Cardiac: ?Goal: Ability to achieve and maintain adequate cardiopulmonary perfusion will improve ?Outcome: Progressing ?  ?Problem: Activity: ?Goal: Capacity to carry out activities will improve ?Outcome: Progressing ?  ?

## 2017-11-29 DIAGNOSIS — R0902 Hypoxemia: Secondary | ICD-10-CM

## 2017-11-29 LAB — BASIC METABOLIC PANEL
Anion gap: 7 (ref 5–15)
BUN: 22 mg/dL (ref 8–23)
CO2: 28 mmol/L (ref 22–32)
Calcium: 8.8 mg/dL — ABNORMAL LOW (ref 8.9–10.3)
Chloride: 102 mmol/L (ref 98–111)
Creatinine, Ser: 1.38 mg/dL — ABNORMAL HIGH (ref 0.44–1.00)
GFR calc Af Amer: 45 mL/min — ABNORMAL LOW (ref >60)
GFR calc non Af Amer: 39 mL/min — ABNORMAL LOW (ref >60)
Glucose, Bld: 146 mg/dL — ABNORMAL HIGH (ref 70–99)
Potassium: 4.4 mmol/L (ref 3.5–5.1)
Sodium: 137 mmol/L (ref 135–145)

## 2017-11-29 LAB — GLUCOSE, CAPILLARY
Glucose-Capillary: 131 mg/dL — ABNORMAL HIGH (ref 70–99)
Glucose-Capillary: 160 mg/dL — ABNORMAL HIGH (ref 70–99)

## 2017-11-29 LAB — MAGNESIUM: Magnesium: 2 mg/dL (ref 1.7–2.4)

## 2017-11-29 MED ORDER — POTASSIUM CHLORIDE ER 20 MEQ PO TBCR: 20.0000 meq | EXTENDED_RELEASE_TABLET | Freq: Every day | ORAL | 2 refills | Status: DC

## 2017-11-29 MED ORDER — FUROSEMIDE 40 MG PO TABS: 40.0000 mg | ORAL_TABLET | Freq: Every day | ORAL | 3 refills | Status: DC

## 2017-11-29 MED ORDER — APIXABAN 5 MG PO TABS: 5.0000 mg | ORAL_TABLET | Freq: Two times a day (BID) | ORAL | 2 refills | Status: DC

## 2017-11-29 MED ORDER — LOSARTAN POTASSIUM 25 MG PO TABS: 25.0000 mg | ORAL_TABLET | Freq: Every day | ORAL | 2 refills | Status: DC

## 2017-11-29 MED ORDER — ASPIRIN 325 MG PO TABS: 325.0000 mg | ORAL_TABLET | Freq: Two times a day (BID) | ORAL | 2 refills | Status: DC

## 2017-11-29 MED ORDER — DIGOXIN 125 MCG PO TABS: 0.1250 mg | ORAL_TABLET | Freq: Every day | ORAL | 3 refills | Status: DC

## 2017-11-29 MED ORDER — METFORMIN HCL 500 MG PO TABS: 500.0000 mg | ORAL_TABLET | Freq: Two times a day (BID) | ORAL | 11 refills | Status: DC

## 2017-11-29 MED ORDER — METOPROLOL SUCCINATE ER 25 MG PO TB24: 25.0000 mg | ORAL_TABLET | Freq: Two times a day (BID) | ORAL | 2 refills | Status: DC

## 2017-11-29 MED FILL — POTASSIUM CL ER 20 MEQ TAB: 20 | 34 days supply | Qty: 34 | Fill #0

## 2017-11-29 MED FILL — METOPROLOL SUCCINATE ER 25: 25 | 34 days supply | Qty: 68 | Fill #0

## 2017-11-29 MED FILL — DIGOXIN 0.125 MG TABLET: 125 | 34 days supply | Qty: 34 | Fill #0

## 2017-11-29 MED FILL — LOSARTAN POTASSIUM 25 MG TA: 25 | 34 days supply | Qty: 34 | Fill #0

## 2017-11-29 MED FILL — FUROSEMIDE 40 MG TAB: 40 | 90 days supply | Qty: 90 | Fill #0

## 2017-11-29 NOTE — Discharge Summary (Addendum)
Physician Discharge Summary  Kaitlyn [name] JKQ:[id number] DOB: February 07, 1951 DOA: 11/22/2017  PCP: Laurey Morale, MD  Admit date: 11/22/2017 Discharge date: 11/29/2017  Time spent: 45* minutes  Recommendations for Outpatient Follow-up:  1. Follow up PCP in 2 weeks 2. Patient has refused HHPT   Discharge Diagnoses:  Principal Problem:   CHF (congestive heart failure) (HCC) Active Problems:   Diabetes type 2, uncontrolled (HCC)   Hypokalemia   Anemia   Discharge Condition: Stable  Diet recommendation: Heart healthy diet  Filed Weights   11/27/17 0442 11/28/17 0654 11/29/17 0605  Weight: 126.6 kg (279 lb 3.2 oz) 122.9 kg (270 lb 14.4 oz) 122.5 kg (270 lb 1 oz)    History of present illness:   67 year old female with a history of hypertension, CHF, EF 15 to 20%, paroxysmal atrial fibrillation, diabetes mellitus, seizure disorder came with dyspnea. Chest x-ray showed cardiomegaly with mild vascular congestion. Patient tells me that she stopped taking Lasix a few years back.   Hospital Course:   1. Acute systolic CHF-echocardiogram from March 2018 showed EF 30 to 35%, patient was started on IV Lasix 80 mg daily hour and diuresed very well, net -27 L.  Lasix was changed to 60 mg IV every 12 hours.  Will start Lasix 40 mg p.o. daily fras per cardiology recommendation.  Patient has been very noncompliant with her medications. Counseled to take her medications as prescribed, will give her paper scripts as requested by patient. 2. Hypokalemia-replete  3. A. fib with RVR-continue Eliquis, Toprol-XL 25 mill grams  twice daily, Digoxin 0.1 mg daily  4. Nonsustained V. Tach-resolved, patient had low magnesium, potassium which has been repleted 5. Diabetes mellitus-Hb A1c is 7.5, will discharge home on Metformin 500 mg po bid. Follow up PCP in 2 weeks     Procedures:  None   Consultations:  Cardiology  Discharge Exam: Vitals:   11/28/17 2124 11/29/17 0422  BP: 114/79 111/67   Pulse: 96 89  Resp: 18 18  Temp: 97.7 F (36.5 C) 97.7 F (36.5 C)  SpO2: 91% 93%    General: Appears in no acute distress Cardiovascular: S1S2 RRR Respiratory: Clear bilaterally  Discharge Instructions   Discharge Instructions    Diet - low sodium heart healthy   Complete by:  As directed    Increase activity slowly   Complete by:  As directed      Allergies as of 11/29/2017      Reactions   Caffeine Other (See Comments)   Seizure from large doses, heart races from small doses   Cranberry Shortness Of Breath, Diarrhea, Itching, Other (See Comments)   Severe headache.."Ocean Spray cranberry juice"   Lisinopril Diarrhea, Itching, Swelling   Penicillins Other (See Comments)   Unknown childhood allergy Has patient had a PCN reaction causing immediate rash, facial/tongue/throat swelling, SOB or lightheadedness with hypotension: unknown Has patient had a PCN reaction causing severe rash involving mucus membranes or skin necrosis: unknown Has patient had a PCN reaction that required hospitalization unknown Has patient had a PCN reaction occurring within the last 10 years: unknown If all of the above answers are "NO", then may proceed with Cephalosporin use.   Other Other (See Comments)   Stimulants Pt has a past hx of seizures      Medication List    STOP taking these medications   aspirin 325 MG tablet   nitrofurantoin (macrocrystal-monohydrate) 100 MG capsule Commonly known as:  MACROBID   phenazopyridine 200 MG tablet Commonly known  as:  PYRIDIUM     TAKE these medications   apixaban 5 MG Tabs tablet Commonly known as:  ELIQUIS Take 1 tablet (5 mg total) by mouth 2 (two) times daily.   digoxin 0.125 MG tablet Commonly known as:  LANOXIN Take 1 tablet (0.125 mg total) by mouth daily.   furosemide 40 MG tablet Commonly known as:  LASIX Take 1 tablet (40 mg total) by mouth daily. Start taking on:  11/30/2017   losartan 25 MG tablet Commonly known as:   COZAAR Take 1 tablet (25 mg total) by mouth daily.   metFORMIN 500 MG tablet Commonly known as:  GLUCOPHAGE Take 1 tablet (500 mg total) by mouth 2 (two) times daily with a meal.   metoprolol succinate 25 MG 24 hr tablet Commonly known as:  TOPROL-XL Take 1 tablet (25 mg total) by mouth 2 (two) times daily.   Potassium Chloride ER 20 MEQ Tbcr Take 20 mEq by mouth daily.      Allergies  Allergen Reactions  . Caffeine Other (See Comments)    Seizure from large doses, heart races from small doses  . Cranberry Shortness Of Breath, Diarrhea, Itching and Other (See Comments)    Severe headache.."Ocean Spray cranberry juice"  . Lisinopril Diarrhea, Itching and Swelling  . Penicillins Other (See Comments)    Unknown childhood allergy Has patient had a PCN reaction causing immediate rash, facial/tongue/throat swelling, SOB or lightheadedness with hypotension: unknown Has patient had a PCN reaction causing severe rash involving mucus membranes or skin necrosis: unknown Has patient had a PCN reaction that required hospitalization unknown Has patient had a PCN reaction occurring within the last 10 years: unknown If all of the above answers are "NO", then may proceed with Cephalosporin use.   . Other Other (See Comments)    Stimulants Pt has a past hx of seizures   Follow-up Information    Laurey Morale, MD Follow up in 2 week(s).   Specialty:  Cardiology Contact information: [address] [place] [address] [phone]            The results of significant diagnostics from this hospitalization (including imaging, microbiology, ancillary and laboratory) are listed below for reference.    Significant Diagnostic Studies: Dg Chest 2 View  Result Date: 11/22/2017 CLINICAL DATA:  Dyspnea EXAM: CHEST - 2 VIEW COMPARISON:  05/30/2017 FINDINGS: Stable cardiomegaly with tortuous atherosclerotic aorta. Mild diffuse pulmonary vascular congestion is noted with  minimal atelectasis at the left lung base. No pulmonary consolidation is noted. Probable hair artifact projects over the left supraclavicular region. No acute osseous abnormality. IMPRESSION: Cardiomegaly with mild vascular congestion. Minimal aortic atherosclerosis without aneurysm. Electronically Signed   By: Tollie Eth M.D.   On: 11/22/2017 22:30    Microbiology: Recent Results (from the past 240 hour(s))  Urine Culture     Status: None   Collection Time: 11/19/17 11:40 AM  Result Value Ref Range Status   Specimen Description URINE, CLEAN CATCH  Final   Special Requests NONE  Final   Culture   Final    NO GROWTH Performed at [facility], [address], [place], [place] [address]    Report Status 11/20/2017 FINAL  Final     Labs: Basic Metabolic Panel: Recent Labs  Lab 11/25/17 0246 11/26/17 0549 11/27/17 0633 11/28/17 0558 11/29/17 0703  NA 141 138 138 136 137  K 3.8 4.2 4.5 4.3 4.4  CL 100 100 102 99 102  CO2 30 31 30 27 28   GLUCOSE 118* 131* 146* 125* 146*  BUN 14 15 17 19 22   CREATININE 1.20* 1.21* 1.14* 1.24* 1.38*  CALCIUM 8.0* 8.4* 8.7* 8.7* 8.8*  MG 2.0 1.9 2.0 2.0 2.0   Liver Function Tests: Recent Labs  Lab 11/23/17 0236  AST 12*  ALT 11  ALKPHOS 86  BILITOT 0.6  PROT 6.6  ALBUMIN 3.1*   No results for input(s): LIPASE, AMYLASE in the last 168 hours. No results for input(s): AMMONIA in the last 168 hours. CBC: Recent Labs  Lab 11/22/17 2317 11/23/17 0236  WBC 8.7 10.0  NEUTROABS 6.6  --   HGB 10.1* 10.3*  HCT 34.9* 35.4*  MCV 118.3* 115.7*  PLT 193 234   Cardiac Enzymes: Recent Labs  Lab 11/23/17 0236 11/23/17 1132  TROPONINI <0.03 <0.03   BNP: BNP (last 3 results) Recent Labs    01/07/17 1813 11/22/17 2317  BNP 205.5* 725.4*    ProBNP (last 3 results) No results for input(s): PROBNP in the last 8760 hours.  CBG: Recent Labs  Lab 11/28/17 0727 11/28/17 1157 11/28/17 1710 11/28/17 2135 11/29/17 0727  GLUCAP  125* 114* 159* 108* 131*       Signed:  Meredeth Ide MD.  [facility] 11/29/2017, 10:47 AM

## 2017-11-29 NOTE — Care Management Important Message (Signed)
Important Message  Patient Details  Name: Kaitlyn [name] MRN: [id number] Date of Birth: 11-24-1950   Medicare Important Message Given:  Yes    Oralia Rud Georgianne Gritz 11/29/2017, 12:48 PM

## 2017-11-29 NOTE — Progress Notes (Signed)
Patient for discharge home today; home address verified with the patient; non emergent ambulance [facility] called as requested. HHC arranged with [facility] and HF clinic will be providing medication for home; Alexis Goodell [phone]

## 2017-11-29 NOTE — Progress Notes (Addendum)
Patient is very well known to me through previous admissions and contact occasionally with the HF [employer] program.  She does not allow home visits and has never come to the [facility] Clinic for outpatient appointments.  I have sent her heart failure medications to the [facility] pharmacy to be filled through [facility] due to the fact that Ms. Milburn says she cannot afford the copay.  I delivered the following medications to her bedside.     Lasix  40 mg daily Losartan 25 mg daily Toprol XL 25 mg BID Digoxin 0.125 mg daily Potassium chloride 20 meq packets daily   Samples of Eliquis 5 mg BID per Dr. Shirlee Latch  She has a follow-up scheduled 12/07/17 at 12 noon in the [facility].  She says she is not sure if she will come.

## 2017-11-29 NOTE — Progress Notes (Signed)
IV and telemetry removed. Discharge instructions and prescriptions given to patient. Patient waiting on [facility] to transport home.

## 2017-11-29 NOTE — Plan of Care (Signed)
  Problem: Health Behavior/Discharge Planning: Goal: Ability to manage health-related needs will improve Outcome: Progressing   Problem: Activity: Goal: Risk for activity intolerance will decrease Outcome: Progressing   Problem: Elimination: Goal: Will not experience complications related to urinary retention Outcome: Progressing   Problem: Pain Managment: Goal: General experience of comfort will improve Outcome: Progressing   

## 2017-12-07 ENCOUNTER — Encounter (HOSPITAL_COMMUNITY): Payer: Self-pay | Admitting: Family Medicine

## 2017-12-07 ENCOUNTER — Encounter (HOSPITAL_COMMUNITY): Payer: Self-pay

## 2017-12-07 ENCOUNTER — Emergency Department (HOSPITAL_COMMUNITY)
Admission: EM | Admit: 2017-12-07 | Discharge: 2017-12-08 | Disposition: A | Discharge status: Home / Self Care | Payer: Medicare Other | Attending: Emergency Medicine | Admitting: Emergency Medicine

## 2017-12-07 DIAGNOSIS — I5043 Acute on chronic combined systolic (congestive) and diastolic (congestive) heart failure: Secondary | ICD-10-CM | POA: Diagnosis not present

## 2017-12-07 DIAGNOSIS — E119 Type 2 diabetes mellitus without complications: Secondary | ICD-10-CM | POA: Insufficient documentation

## 2017-12-07 DIAGNOSIS — R197 Diarrhea, unspecified: Secondary | ICD-10-CM | POA: Diagnosis present

## 2017-12-07 DIAGNOSIS — I252 Old myocardial infarction: Secondary | ICD-10-CM | POA: Insufficient documentation

## 2017-12-07 DIAGNOSIS — Z7984 Long term (current) use of oral hypoglycemic drugs: Secondary | ICD-10-CM | POA: Insufficient documentation

## 2017-12-07 DIAGNOSIS — I110 Hypertensive heart disease with heart failure: Secondary | ICD-10-CM | POA: Diagnosis not present

## 2017-12-07 DIAGNOSIS — Z79899 Other long term (current) drug therapy: Secondary | ICD-10-CM | POA: Diagnosis not present

## 2017-12-07 DIAGNOSIS — N3000 Acute cystitis without hematuria: Secondary | ICD-10-CM

## 2017-12-07 DIAGNOSIS — Z7901 Long term (current) use of anticoagulants: Secondary | ICD-10-CM | POA: Diagnosis not present

## 2017-12-07 LAB — CBC WITH DIFFERENTIAL/PLATELET
Basophils Absolute: 0.1 10*3/uL (ref 0.0–0.1)
Basophils Relative: 1 %
Eosinophils Absolute: 0.3 10*3/uL (ref 0.0–0.7)
Eosinophils Relative: 3 %
HCT: 34.4 % — ABNORMAL LOW (ref 36.0–46.0)
Hemoglobin: 10.5 g/dL — ABNORMAL LOW (ref 12.0–15.0)
Lymphocytes Relative: 12 %
Lymphs Abs: 1.2 10*3/uL (ref 0.7–4.0)
MCH: 33.4 pg (ref 26.0–34.0)
MCHC: 30.5 g/dL (ref 30.0–36.0)
MCV: 109.6 fL — ABNORMAL HIGH (ref 78.0–100.0)
Monocytes Absolute: 0.6 10*3/uL (ref 0.1–1.0)
Monocytes Relative: 6 %
Neutro Abs: 7.9 10*3/uL — ABNORMAL HIGH (ref 1.7–7.7)
Neutrophils Relative %: 78 %
Platelets: 221 10*3/uL (ref 150–400)
RBC: 3.14 MIL/uL — ABNORMAL LOW (ref 3.87–5.11)
RDW: 16.2 % — ABNORMAL HIGH (ref 11.5–15.5)
WBC: 10.1 10*3/uL (ref 4.0–10.5)

## 2017-12-07 LAB — URINALYSIS, ROUTINE W REFLEX MICROSCOPIC
Bilirubin Urine: NEGATIVE
Glucose, UA: NEGATIVE mg/dL
Ketones, ur: NEGATIVE mg/dL
Nitrite: NEGATIVE
Protein, ur: 30 mg/dL — AB
Specific Gravity, Urine: 1.016 (ref 1.005–1.030)
WBC, UA: >50 WBC/hpf — ABNORMAL HIGH (ref 0–5)
pH: 5 (ref 5.0–8.0)

## 2017-12-07 LAB — COMPREHENSIVE METABOLIC PANEL
ALT: 10 U/L (ref 0–44)
AST: 11 U/L — ABNORMAL LOW (ref 15–41)
Albumin: 3.5 g/dL (ref 3.5–5.0)
Alkaline Phosphatase: 95 U/L (ref 38–126)
Anion gap: 8 (ref 5–15)
BUN: 18 mg/dL (ref 8–23)
CO2: 24 mmol/L (ref 22–32)
Calcium: 8.8 mg/dL — ABNORMAL LOW (ref 8.9–10.3)
Chloride: 111 mmol/L (ref 98–111)
Creatinine, Ser: 1.07 mg/dL — ABNORMAL HIGH (ref 0.44–1.00)
GFR calc Af Amer: >60 mL/min (ref >60)
GFR calc non Af Amer: 52 mL/min — ABNORMAL LOW (ref >60)
Glucose, Bld: 127 mg/dL — ABNORMAL HIGH (ref 70–99)
Potassium: 4 mmol/L (ref 3.5–5.1)
Sodium: 143 mmol/L (ref 135–145)
Total Bilirubin: 0.5 mg/dL (ref 0.3–1.2)
Total Protein: 7 g/dL (ref 6.5–8.1)

## 2017-12-07 LAB — MAGNESIUM: Magnesium: 2.2 mg/dL (ref 1.7–2.4)

## 2017-12-07 LAB — BRAIN NATRIURETIC PEPTIDE: B Natriuretic Peptide: 486.8 pg/mL — ABNORMAL HIGH (ref 0.0–100.0)

## 2017-12-07 MED ORDER — LOPERAMIDE HCL 1 MG/7.5ML PO SUSP: 2.0000 mg | ORAL | 0 refills | Status: DC | PRN

## 2017-12-07 MED ORDER — FOSFOMYCIN TROMETHAMINE 3 G PO PACK
3.0000 g | PACK | Freq: Once | ORAL | Status: AC
  Administered 2017-12-07: 3 g via ORAL
  Filled 2017-12-07: qty 3

## 2017-12-07 MED ORDER — LOPERAMIDE HCL 1 MG/7.5ML PO SUSP
4.0000 mg | Freq: Four times a day (QID) | ORAL | Status: DC | PRN
  Administered 2017-12-07: 4 mg via ORAL
  Filled 2017-12-07 (×2): qty 30

## 2017-12-07 NOTE — Discharge Instructions (Signed)
Your work-up today showed evidence of urinary tract infection which was treated you for.  We suspect her diarrhea was related to the food intake given your history of this.  Your other work-up was reassuring and we feel you are safe for discharge home.  Please continue your home medications and follow-up with your primary doctor.  If any symptoms change or worsen, please return to the nearest emergency department.

## 2017-12-07 NOTE — ED Notes (Signed)
[facility] has been notified for transportation.
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
## 2017-12-07 NOTE — ED Triage Notes (Signed)
Patient is from home and transported via [facility]. Patient is having diarrhea, and believes she got sick from the [employer]. Symptoms started 3-4 hours ago and had 3 episodes of diarrhea. She has tried pepto-bismal. Per EMS, she was discharged from [facility] on Monday related to cardiac issues. Also, EMS is concerned about patients living condition and thinks a social work consult needs to be placed.

## 2017-12-07 NOTE — ED Notes (Signed)
Provider at bedside

## 2017-12-08 MED ORDER — LOPERAMIDE HCL 2 MG PO CAPS
4.0000 mg | ORAL_CAPSULE | Freq: Once | ORAL | Status: AC
  Administered 2017-12-08: 4 mg via ORAL
  Filled 2017-12-08: qty 2

## 2017-12-08 NOTE — ED Notes (Signed)
[facility] here for transport home.  

## 2017-12-09 LAB — URINE CULTURE

## 2018-03-08 ENCOUNTER — Emergency Department (HOSPITAL_COMMUNITY): Payer: Medicare Other

## 2018-03-08 ENCOUNTER — Encounter (HOSPITAL_COMMUNITY): Payer: Self-pay

## 2018-03-08 ENCOUNTER — Other Ambulatory Visit: Payer: Self-pay

## 2018-03-08 ENCOUNTER — Inpatient Hospital Stay (HOSPITAL_COMMUNITY)
Admission: EM | Admit: 2018-03-08 | Discharge: 2018-03-15 | DRG: 292 | Disposition: A | Discharge status: Home / Self Care | Payer: Medicare Other | Attending: Internal Medicine | Admitting: Internal Medicine

## 2018-03-08 DIAGNOSIS — E119 Type 2 diabetes mellitus without complications: Secondary | ICD-10-CM | POA: Diagnosis present

## 2018-03-08 DIAGNOSIS — I110 Hypertensive heart disease with heart failure: Principal | ICD-10-CM | POA: Diagnosis present

## 2018-03-08 DIAGNOSIS — Z79899 Other long term (current) drug therapy: Secondary | ICD-10-CM

## 2018-03-08 DIAGNOSIS — D7589 Other specified diseases of blood and blood-forming organs: Secondary | ICD-10-CM | POA: Diagnosis present

## 2018-03-08 DIAGNOSIS — I4891 Unspecified atrial fibrillation: Secondary | ICD-10-CM | POA: Diagnosis present

## 2018-03-08 DIAGNOSIS — R0602 Shortness of breath: Secondary | ICD-10-CM | POA: Diagnosis present

## 2018-03-08 DIAGNOSIS — G40909 Epilepsy, unspecified, not intractable, without status epilepticus: Secondary | ICD-10-CM | POA: Diagnosis present

## 2018-03-08 DIAGNOSIS — I4819 Other persistent atrial fibrillation: Secondary | ICD-10-CM | POA: Diagnosis present

## 2018-03-08 DIAGNOSIS — Z91199 Patient's noncompliance with other medical treatment and regimen due to unspecified reason: Secondary | ICD-10-CM

## 2018-03-08 DIAGNOSIS — Z9119 Patient's noncompliance with other medical treatment and regimen: Secondary | ICD-10-CM

## 2018-03-08 DIAGNOSIS — B351 Tinea unguium: Secondary | ICD-10-CM | POA: Diagnosis present

## 2018-03-08 DIAGNOSIS — E538 Deficiency of other specified B group vitamins: Secondary | ICD-10-CM | POA: Diagnosis present

## 2018-03-08 DIAGNOSIS — Z833 Family history of diabetes mellitus: Secondary | ICD-10-CM | POA: Diagnosis not present

## 2018-03-08 DIAGNOSIS — Z7984 Long term (current) use of oral hypoglycemic drugs: Secondary | ICD-10-CM

## 2018-03-08 DIAGNOSIS — Z9114 Patient's other noncompliance with medication regimen: Secondary | ICD-10-CM

## 2018-03-08 DIAGNOSIS — Z7901 Long term (current) use of anticoagulants: Secondary | ICD-10-CM | POA: Diagnosis not present

## 2018-03-08 DIAGNOSIS — Z6835 Body mass index (BMI) 35.0-35.9, adult: Secondary | ICD-10-CM | POA: Diagnosis not present

## 2018-03-08 DIAGNOSIS — E871 Hypo-osmolality and hyponatremia: Secondary | ICD-10-CM | POA: Diagnosis present

## 2018-03-08 DIAGNOSIS — E66813 Obesity, class 3: Secondary | ICD-10-CM | POA: Diagnosis present

## 2018-03-08 DIAGNOSIS — D649 Anemia, unspecified: Secondary | ICD-10-CM | POA: Diagnosis present

## 2018-03-08 DIAGNOSIS — I10 Essential (primary) hypertension: Secondary | ICD-10-CM | POA: Diagnosis present

## 2018-03-08 DIAGNOSIS — I5043 Acute on chronic combined systolic (congestive) and diastolic (congestive) heart failure: Secondary | ICD-10-CM | POA: Diagnosis not present

## 2018-03-08 DIAGNOSIS — I5020 Unspecified systolic (congestive) heart failure: Secondary | ICD-10-CM | POA: Diagnosis not present

## 2018-03-08 DIAGNOSIS — Z9111 Patient's noncompliance with dietary regimen: Secondary | ICD-10-CM

## 2018-03-08 DIAGNOSIS — E1165 Type 2 diabetes mellitus with hyperglycemia: Secondary | ICD-10-CM

## 2018-03-08 DIAGNOSIS — I429 Cardiomyopathy, unspecified: Secondary | ICD-10-CM | POA: Diagnosis present

## 2018-03-08 DIAGNOSIS — D509 Iron deficiency anemia, unspecified: Secondary | ICD-10-CM | POA: Diagnosis present

## 2018-03-08 DIAGNOSIS — R569 Unspecified convulsions: Secondary | ICD-10-CM

## 2018-03-08 DIAGNOSIS — I340 Nonrheumatic mitral (valve) insufficiency: Secondary | ICD-10-CM | POA: Diagnosis not present

## 2018-03-08 DIAGNOSIS — I5023 Acute on chronic systolic (congestive) heart failure: Secondary | ICD-10-CM

## 2018-03-08 DIAGNOSIS — I509 Heart failure, unspecified: Secondary | ICD-10-CM

## 2018-03-08 LAB — CBC
HCT: 38.6 % (ref 36.0–46.0)
Hemoglobin: 11.1 g/dL — ABNORMAL LOW (ref 12.0–15.0)
MCH: 29.8 pg (ref 26.0–34.0)
MCHC: 28.8 g/dL — ABNORMAL LOW (ref 30.0–36.0)
MCV: 103.5 fL — ABNORMAL HIGH (ref 80.0–100.0)
Platelets: 164 10*3/uL (ref 150–400)
RBC: 3.73 MIL/uL — ABNORMAL LOW (ref 3.87–5.11)
RDW: 15.9 % — ABNORMAL HIGH (ref 11.5–15.5)
WBC: 7.5 10*3/uL (ref 4.0–10.5)
nRBC: 0 % (ref 0.0–0.2)

## 2018-03-08 LAB — DIGOXIN LEVEL: Digoxin Level: <0.2 ng/mL — ABNORMAL LOW (ref 0.8–2.0)

## 2018-03-08 LAB — GLUCOSE, CAPILLARY: Glucose-Capillary: 76 mg/dL (ref 70–99)

## 2018-03-08 LAB — BASIC METABOLIC PANEL
Anion gap: 6 (ref 5–15)
BUN: 8 mg/dL (ref 8–23)
CO2: 24 mmol/L (ref 22–32)
Calcium: 8.3 mg/dL — ABNORMAL LOW (ref 8.9–10.3)
Chloride: 109 mmol/L (ref 98–111)
Creatinine, Ser: 0.96 mg/dL (ref 0.44–1.00)
GFR calc Af Amer: >60 mL/min (ref >60)
GFR calc non Af Amer: 60 mL/min — ABNORMAL LOW (ref >60)
Glucose, Bld: 120 mg/dL — ABNORMAL HIGH (ref 70–99)
Potassium: 3.4 mmol/L — ABNORMAL LOW (ref 3.5–5.1)
Sodium: 139 mmol/L (ref 135–145)

## 2018-03-08 LAB — HEMOGLOBIN A1C
Hgb A1c MFr Bld: 6.4 % — ABNORMAL HIGH (ref 4.8–5.6)
Mean Plasma Glucose: 136.98 mg/dL

## 2018-03-08 LAB — TSH: TSH: 3.37 u[IU]/mL (ref 0.350–4.500)

## 2018-03-08 LAB — BRAIN NATRIURETIC PEPTIDE: B Natriuretic Peptide: 1146.6 pg/mL — ABNORMAL HIGH (ref 0.0–100.0)

## 2018-03-08 MED ORDER — INSULIN ASPART 100 UNIT/ML ~~LOC~~ SOLN
0.0000 [IU] | Freq: Three times a day (TID) | SUBCUTANEOUS | Status: DC
  Administered 2018-03-10 – 2018-03-15 (×4): 2 [IU] via SUBCUTANEOUS

## 2018-03-08 MED ORDER — SODIUM CHLORIDE 0.9% FLUSH
3.0000 mL | Freq: Two times a day (BID) | INTRAVENOUS | Status: DC
  Administered 2018-03-08 – 2018-03-15 (×14): 3 mL via INTRAVENOUS

## 2018-03-08 MED ORDER — ACETAMINOPHEN 325 MG PO TABS
650.0000 mg | ORAL_TABLET | ORAL | Status: DC | PRN
  Administered 2018-03-09 – 2018-03-14 (×4): 650 mg via ORAL
  Filled 2018-03-08 (×4): qty 2

## 2018-03-08 MED ORDER — DIGOXIN 125 MCG PO TABS
0.1250 mg | ORAL_TABLET | Freq: Every day | ORAL | Status: DC
  Administered 2018-03-08 – 2018-03-15 (×8): 0.125 mg via ORAL
  Filled 2018-03-08 (×8): qty 1

## 2018-03-08 MED ORDER — METOPROLOL SUCCINATE ER 50 MG PO TB24
50.0000 mg | ORAL_TABLET | Freq: Two times a day (BID) | ORAL | Status: DC
  Administered 2018-03-08 – 2018-03-15 (×14): 50 mg via ORAL
  Filled 2018-03-08 (×14): qty 1

## 2018-03-08 MED ORDER — FUROSEMIDE 10 MG/ML IJ SOLN
40.0000 mg | Freq: Two times a day (BID) | INTRAMUSCULAR | Status: DC
  Administered 2018-03-09 – 2018-03-10 (×3): 40 mg via INTRAVENOUS
  Filled 2018-03-08 (×4): qty 4

## 2018-03-08 MED ORDER — ASPIRIN EC 81 MG PO TBEC
81.0000 mg | DELAYED_RELEASE_TABLET | Freq: Every day | ORAL | Status: DC
  Administered 2018-03-09: 81 mg via ORAL
  Filled 2018-03-08: qty 1

## 2018-03-08 MED ORDER — SODIUM CHLORIDE 0.9 % IV SOLN: 250.0000 mL | INTRAVENOUS | Status: DC | PRN

## 2018-03-08 MED ORDER — FUROSEMIDE 10 MG/ML IJ SOLN
40.0000 mg | Freq: Once | INTRAMUSCULAR | Status: AC
  Administered 2018-03-08: 40 mg via INTRAVENOUS
  Filled 2018-03-08: qty 4

## 2018-03-08 MED ORDER — LOSARTAN POTASSIUM 25 MG PO TABS
25.0000 mg | ORAL_TABLET | Freq: Every day | ORAL | Status: DC
  Administered 2018-03-08 – 2018-03-15 (×8): 25 mg via ORAL
  Filled 2018-03-08 (×8): qty 1

## 2018-03-08 MED ORDER — SODIUM CHLORIDE 0.9% FLUSH: 3.0000 mL | INTRAVENOUS | Status: DC | PRN

## 2018-03-08 MED ORDER — ONDANSETRON HCL 4 MG/2ML IJ SOLN: 4.0000 mg | Freq: Four times a day (QID) | INTRAMUSCULAR | Status: DC | PRN

## 2018-03-08 MED ORDER — INSULIN ASPART 100 UNIT/ML ~~LOC~~ SOLN: 0.0000 [IU] | Freq: Every day | SUBCUTANEOUS | Status: DC

## 2018-03-08 MED ORDER — APIXABAN 5 MG PO TABS
5.0000 mg | ORAL_TABLET | Freq: Two times a day (BID) | ORAL | Status: DC
  Administered 2018-03-08 – 2018-03-15 (×14): 5 mg via ORAL
  Filled 2018-03-08 (×14): qty 1

## 2018-03-08 NOTE — ED Notes (Signed)
Pt states "I can't take my lasix at home because I can barely walk to the bathroom once a day to poop and make food in the microwave. Lasix makes me pee every 5 minutes and it's impossible for me to get up that often. It's just not possible." When pt was asked about assisted living, she stated "if it costs money I can't do it." Will continue to monitor.

## 2018-03-08 NOTE — ED Notes (Signed)
Patient transported to X-ray 

## 2018-03-08 NOTE — Progress Notes (Addendum)
   Consult requested for assistance with CHF exacerbation and atrial fibrillation. Dr. Shirlee Latch and Dr. Gala Romney, who are very familiar with the patient from prior hospitalizations, state that due to non-compliance with medications and outpatient follow-up, she is dismissed from [facility]. As such, we recommend care with one of the other Cardiologists in this area. Dr. Ophelia Charter aware and will reach out to Dr. Joaquim Nam.   Beatriz Stallion, PA-C 03/08/18   Agree with above. Due to non compliance, has been dismissed. Last seen by [facility] in July 2019.  Donato Schultz, MD
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
## 2018-03-08 NOTE — ED Triage Notes (Signed)
Pt arrives to ED from home with complaints of shortness of breath and palpitations for several days. EMS reports pt is in afib with rvr with rate of 110-160. Pt also complains of having her telephone and cable cut off and she "feels like I'm on the moon, it's just been so quiet.". Pt placed in position of comfort with bed locked and lowered, call bell in reach.

## 2018-03-08 NOTE — ED Notes (Signed)
Hospitalist bedside 

## 2018-03-08 NOTE — ED Notes (Signed)
Pt cleaned and placed in dry gown and sheets by RN and tech.

## 2018-03-09 ENCOUNTER — Other Ambulatory Visit: Payer: Self-pay

## 2018-03-09 ENCOUNTER — Inpatient Hospital Stay (HOSPITAL_COMMUNITY): Payer: Medicare Other

## 2018-03-09 DIAGNOSIS — I340 Nonrheumatic mitral (valve) insufficiency: Secondary | ICD-10-CM

## 2018-03-09 LAB — CBC WITH DIFFERENTIAL/PLATELET
Abs Immature Granulocytes: 0.02 10*3/uL (ref 0.00–0.07)
Basophils Absolute: 0.1 10*3/uL (ref 0.0–0.1)
Basophils Relative: 1 %
Eosinophils Absolute: 0.3 10*3/uL (ref 0.0–0.5)
Eosinophils Relative: 4 %
HCT: 37.3 % (ref 36.0–46.0)
Hemoglobin: 11 g/dL — ABNORMAL LOW (ref 12.0–15.0)
Immature Granulocytes: 0 %
Lymphocytes Relative: 15 %
Lymphs Abs: 1.1 10*3/uL (ref 0.7–4.0)
MCH: 30.1 pg (ref 26.0–34.0)
MCHC: 29.5 g/dL — ABNORMAL LOW (ref 30.0–36.0)
MCV: 101.9 fL — ABNORMAL HIGH (ref 80.0–100.0)
Monocytes Absolute: 0.7 10*3/uL (ref 0.1–1.0)
Monocytes Relative: 9 %
Neutro Abs: 5.3 10*3/uL (ref 1.7–7.7)
Neutrophils Relative %: 71 %
Platelets: 164 10*3/uL (ref 150–400)
RBC: 3.66 MIL/uL — ABNORMAL LOW (ref 3.87–5.11)
RDW: 15.9 % — ABNORMAL HIGH (ref 11.5–15.5)
WBC: 7.5 10*3/uL (ref 4.0–10.5)
nRBC: 0 % (ref 0.0–0.2)

## 2018-03-09 LAB — GLUCOSE, CAPILLARY
Glucose-Capillary: 87 mg/dL (ref 70–99)
Glucose-Capillary: 106 mg/dL — ABNORMAL HIGH (ref 70–99)
Glucose-Capillary: 107 mg/dL — ABNORMAL HIGH (ref 70–99)
Glucose-Capillary: 122 mg/dL — ABNORMAL HIGH (ref 70–99)

## 2018-03-09 LAB — BASIC METABOLIC PANEL
Anion gap: 7 (ref 5–15)
BUN: 8 mg/dL (ref 8–23)
CO2: 26 mmol/L (ref 22–32)
Calcium: 8.1 mg/dL — ABNORMAL LOW (ref 8.9–10.3)
Chloride: 107 mmol/L (ref 98–111)
Creatinine, Ser: 1.04 mg/dL — ABNORMAL HIGH (ref 0.44–1.00)
GFR calc Af Amer: >60 mL/min (ref >60)
GFR calc non Af Amer: 54 mL/min — ABNORMAL LOW (ref >60)
Glucose, Bld: 102 mg/dL — ABNORMAL HIGH (ref 70–99)
Potassium: 3.4 mmol/L — ABNORMAL LOW (ref 3.5–5.1)
Sodium: 140 mmol/L (ref 135–145)

## 2018-03-09 LAB — ECHOCARDIOGRAM COMPLETE
Height: 71 in
Weight: 4959.47 oz

## 2018-03-09 MED ORDER — POTASSIUM CHLORIDE CRYS ER 20 MEQ PO TBCR
40.0000 meq | EXTENDED_RELEASE_TABLET | Freq: Once | ORAL | Status: AC
  Administered 2018-03-09: 40 meq via ORAL
  Filled 2018-03-09: qty 2

## 2018-03-09 NOTE — Progress Notes (Signed)
Two person assist with bathing patient and full linen change. Purewick replaced.

## 2018-03-09 NOTE — Progress Notes (Signed)
Pt's bed was found to be wet. Pt refused to be changed when offered to be cleaned. The NT and RN both individual and separately asked the pt.

## 2018-03-09 NOTE — Progress Notes (Signed)
PT Cancellation Note  Patient Details Name: Kaitlyn [name] MRN: [id number] DOB: 04/09/1951   Cancelled Treatment:    Reason Eval/Treat Not Completed: Other (comment)(Adamant refusal of PT.  "I will not do PT. I cannot exercise by legs because when yall work with me I hurt more.  I walk fine by myself.") Will sign off.    Berline Lopes 03/09/2018, 10:24 AM [facility] Pager:  [phone]  Office:  [phone]

## 2018-03-09 NOTE — Progress Notes (Signed)
Patient continues to refuse to be cleaned. Patient's bed pad is soaked. Will attempt assisting patient to get cleaned at another time.

## 2018-03-09 NOTE — Care Management Note (Signed)
Case Management Note  Patient Details  Name: Kaitlyn [name] MRN: [id number] Date of Birth: [birth year]  Subjective/Objective:     CHF              Action/Plan: 03/09/2018 - CM will continue to follow for progression of care; B Shelba Flake  11/24/2017 - Action/Plan: Patient is known to me from previous admissions; PCP - none. CM asked patient if I could assist her in finding a PCP, patient refused, stated I see Dr Shirlee Latch; has private insurance with Medicare; pharmacy of choice is [facility], they deliver medication to her home; DME - walker and wheelchair at her home, she is refusing a bedside commode at this time; she is active with [facility] as prior to admission for [name] services; CM will continue to follow for progression of care. [name]  Expected Discharge Date:   possibly 03/12/2018               Expected Discharge Plan:  Home/Self Care  Discharge planning Services  CM Consult  Status of Service:  In process, will continue to follow  [name] [phone] 03/09/2018, 1:40 PM

## 2018-03-09 NOTE — Clinical Social Work Note (Signed)
Clinical Social Work Assessment  Patient Details  Name: Kaitlyn [name] MRN: [id number] Date of Birth: 26-Oct-1950  Date of referral:  03/09/18               Reason for consult:  Abuse/Neglect                Permission sought to share information with:    Permission granted to share information::  No  Name::        Agency::     Relationship::     Contact Information:     Housing/Transportation Living arrangements for the past 2 months:  Apartment Source of Information:  Patient, Medical Team Patient Interpreter Needed:  None Criminal Activity/Legal Involvement Pertinent to Current Situation/Hospitalization:  No - Comment as needed Significant Relationships:  Neighbor Lives with:  Self Do you feel safe going back to the place where you live?  Yes Need for family participation in patient care:  Yes (Comment)  Care giving concerns:  Self-neglect.   Social Worker assessment / plan:  CSW met with patient. No supports at bedside. CSW introduced role and inquired about resources needs. Patient reports she has no needs. She gets social security once per month and was paid yesterday. She orders her groceries online and has them delivered to her home. She has two neighbors that assist her at home. Patient lives in an apartment building. Patient stated she recently unknowingly did not pay enough on her phone/cable/internet bill so was without them for five days. She plans on getting a trac phone to carry around with her in case of emergencies. Patient spends much of her days in her recliner. She states it is often difficult for her to push herself out of the recliner up to her walker. She also admits to not bathing often due to difficulty getting out of the bathtub. She will go to the sink and wash herself instead. Patient lived in [place] for 51 years before moving to [place] around 2013/08/07. Her father died in the 73's and her mother died in 2010/08/07. She has three cousins that live locally.  Patient is refusing home health services and equipment. No further concerns. CSW signing off as social work intervention is no longer needed.   Employment status:  Retired Forensic scientist:  Medicare PT Recommendations:  Not assessed at this time [place] / Referral to community resources:  Other (Comment Required)(Patient refusing resources.)  Patient/Family's Response to care:  Patient refusing any services. Patient's neighbors are supportive and involved in patient's care. Patient appreciated social work intervention.  Patient/Family's Understanding of and Emotional Response to Diagnosis, Current Treatment, and Prognosis:  Patient has a good understanding of the reason for admission and social work consult. Patient appears happy with hospital care. She stated the food is very good.  Emotional Assessment Appearance:  Appears stated age Attitude/Demeanor/Rapport:  Engaged, Gracious Affect (typically observed):  Appropriate, Calm, Pleasant Orientation:  Oriented to Self, Oriented to Place, Oriented to  Time, Oriented to Situation Alcohol / Substance use:  Never Used Psych involvement (Current and /or in the community):  No (Comment)  Discharge Needs  Concerns to be addressed:  Care Coordination, Patient refuses services Readmission within the last 30 days:  No Current discharge risk:  Dependent with Mobility, Lives alone Barriers to Discharge:  Continued Medical Work up   Candie Chroman, LCSW 03/09/2018, 2:47 PM
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
## 2018-03-09 NOTE — Progress Notes (Addendum)
PROGRESS NOTE    Kaitlyn [name]  XQJ:[id number] DOB: 1951-05-10 DOA: 03/08/2018 PCP: Laurey Morale, MD   Brief Narrative:  Kaitlyn [name] is Kaitlyn [name] 67 y.o. female with medical history significant of systolic CHF; DM; and afib presenting with afib with RVR. She has afib and has had trouble breathing the last few days.  Saturday AM, her TV, internet, and phone were disconnected.  She was trying to get help and her neighbor finally called 911 today.  +intermittent heart racing.  She last took medicines about 2 days ago.  +SOB.  No cough.  No fever.  +LE edema.  Increasing mobility issues recently (days to weeks).  She reports medication compliance to me, but her dig level is persistently <0.2 including today and she told cardiology that she doesn't take her Lasix due to difficulty getting to the bathroom.  She also reported during prior hospitalization that she does not have Tersea Aulds car and cannot afford Herny Scurlock taxi to appointments and so her only access to care is via EMS/ER.  Based on the fact that she has never before been to an outpatient clinic appointment, the heart failure team has terminated the patient from their practice and she will not be able to be seen by [facility] cardiologists.   Assessment & Plan:   Principal Problem:   Acute on chronic combined systolic and diastolic heart failure (HCC) Active Problems:   H/O noncompliance with medical treatment, presenting hazards to health   Essential hypertension   Diabetes type 2, uncontrolled (HCC)   Seizures (HCC)   Atrial fibrillation with rapid ventricular response (HCC)   Obesity, Class III, BMI 40-49.9 (morbid obesity) (HCC)   Anemia   Acute on chronic CHF exacerbation -Patient with prior echo in 3/18 showing EF 30-35% with insufficient diastolic evaluation -She was admitted in 7/19 and echo was insufficient to further evaluate but she diuresed significantly -repeat echo today pending -CXR with cardiomegaly and possible mild interstitial edema  (vs chronic interstitial lung disease) -Markedly elevated BNP -Afib with RVR on presentation likely contributing to CHF  -Will admit with telemetry - she has very limited resources and is very likely to require multiple days of diuresis before she is safe to return home independently -Continue arb/beta blocker.  ASA started by admitting provider, but will d/c as pt on eliquis and no hx of MI/CVA that I see documented. - appreciate Dr. Joaquim Nam, pt dismissed from [facility] practice  Afib with RVR -Continue metop and digoxin, rate now improved -Continue eliquis -dig level again undetectable - TSH wnl  DM - A1c 6.4 -hold Glucophage -Cover with moderate-scale SSI  HTN -Continue Cozaar and Toprol XL  Medication noncomplaince -Patient may have underlying psych condition preventing her from being compliant -She lives alone and it is not clear this is safe -APS consult placed by prior physician, will need to follow up with social work regarding this  Anemia -Appears to be stable, will follow  Obesity -She appears to be sedentary and without significant resources and so this may be problematic  Seizures -Remote h/o -No longer apparently taking medications for this issue  DVT prophylaxis: eliquis Code Status: full  Family Communication: none at bedside Disposition Plan: pending improvement, needs several days of additional diuresis   Consultants:   cardiology  Procedures:   Echo pending  Antimicrobials:  Anti-infectives (From admission, onward)   None     Subjective: SOB better. No other complaints right now. Wants to watch soaps.  Not cooperating with staff (PT wanted  to work with her and staff also trying to clean her up).  Discussed importance of working with staff.  Objective: Vitals:   03/09/18 0049 03/09/18 0700 03/09/18 1218 03/09/18 1938  BP: 126/81 132/90 136/85 118/79  Pulse: 82 88 93 98  Resp: 20 18 18    Temp: (!) 97.4 F (36.3 C) 98 F (36.7 C)  (!) 97.5 F (36.4 C) 98.1 F (36.7 C)  TempSrc: Oral Oral Oral Oral  SpO2: 95% 98% 96% 91%  Weight:  129.8 kg    Height:        Intake/Output Summary (Last 24 hours) at 03/09/2018 1944 Last data filed at 03/09/2018 1938 Gross per 24 hour  Intake 243 ml  Output 3700 ml  Net -3457 ml   Filed Weights   03/08/18 1225 03/09/18 0700  Weight: (!) 147.4 kg 129.8 kg    Examination:  General exam: Appears calm and comfortable  Respiratory system: Clear to auscultation. Respiratory effort normal. Cardiovascular system: S1 & S2 heard, irregularly irregular. Gastrointestinal system: Abdomen is nondistended, soft and nontender. No organomegaly or masses felt. Normal bowel sounds heard. Central nervous system: Alert and oriented. No focal neurological deficits. Extremities: bilateral [name] edema Skin: No rashes, lesions or ulcers Psychiatry: Judgement and insight appear normal. Mood & affect appropriate.     Data Reviewed: I have personally reviewed following labs and imaging studies  CBC: Recent Labs  Lab 03/08/18 1235 03/09/18 0448  WBC 7.5 7.5  NEUTROABS  --  5.3  HGB 11.1* 11.0*  HCT 38.6 37.3  MCV 103.5* 101.9*  PLT 164 164   Basic Metabolic Panel: Recent Labs  Lab 03/08/18 1235 03/09/18 0448  NA 139 140  K 3.4* 3.4*  CL 109 107  CO2 24 26  GLUCOSE 120* 102*  BUN 8 8  CREATININE 0.96 1.04*  CALCIUM 8.3* 8.1*   GFR: Estimated Creatinine Clearance: 78.2 mL/min (Lynita Groseclose) (by C-G formula based on SCr of 1.04 mg/dL (H)). Liver Function Tests: No results for input(s): AST, ALT, ALKPHOS, BILITOT, PROT, ALBUMIN in the last 168 hours. No results for input(s): LIPASE, AMYLASE in the last 168 hours. No results for input(s): AMMONIA in the last 168 hours. Coagulation Profile: No results for input(s): INR, PROTIME in the last 168 hours. Cardiac Enzymes: No results for input(s): CKTOTAL, CKMB, CKMBINDEX, TROPONINI in the last 168 hours. BNP (last 3 results) No results for  input(s): PROBNP in the last 8760 hours. HbA1C: Recent Labs    03/08/18 1928  HGBA1C 6.4*   CBG: Recent Labs  Lab 03/08/18 2103 03/09/18 0753 03/09/18 1212 03/09/18 1657  GLUCAP 76 87 106* 107*   Lipid Profile: No results for input(s): CHOL, HDL, LDLCALC, TRIG, CHOLHDL, LDLDIRECT in the last 72 hours. Thyroid Function Tests: Recent Labs    03/08/18 1928  TSH 3.370   Anemia Panel: No results for input(s): VITAMINB12, FOLATE, FERRITIN, TIBC, IRON, RETICCTPCT in the last 72 hours. Sepsis Labs: No results for input(s): PROCALCITON, LATICACIDVEN in the last 168 hours.  No results found for this or any previous visit (from the past 240 hour(s)).       Radiology Studies: Dg Chest 2 View  Result Date: 03/08/2018 CLINICAL DATA:  Shortness of breath.  Weakness. EXAM: CHEST - 2 VIEW COMPARISON:  11/22/2017. FINDINGS: Cardiomegaly with normal pulmonary vascularity. Diffuse bilateral mild interstitial prominence consistent chronic interstitial lung disease again noted. No interim change. Tiny left pleural effusion. No pneumothorax. IMPRESSION: 1.  Stable cardiomegaly.  No pulmonary venous congestion. 2. Stable  bilateral interstitial prominence most consistent chronic interstitial lung disease. An active interstitial process including mild interstitial edema cannot be entirely excluded. Tiny left pleural effusion. Electronically Signed   By: Maisie Fus  Register   On: 03/08/2018 13:15        Scheduled Meds: . apixaban  5 mg Oral BID  . aspirin EC  81 mg Oral Daily  . digoxin  0.125 mg Oral Daily  . furosemide  40 mg Intravenous Q12H  . insulin aspart  0-15 Units Subcutaneous TID WC  . insulin aspart  0-5 Units Subcutaneous QHS  . losartan  25 mg Oral Daily  . metoprolol succinate  50 mg Oral BID  . sodium chloride flush  3 mL Intravenous Q12H   Continuous Infusions: . sodium chloride       LOS: 1 day    Time spent: over 30 min    Lacretia Nicks, MD [facility] Pager [phone]  If 7PM-7AM, please contact night-coverage [email or web address] Password [facility] 03/09/2018, 7:44 PM

## 2018-03-09 NOTE — Progress Notes (Signed)
  Echocardiogram 2D Echocardiogram has been performed.  Kaitlyn [name] L Androw 03/09/2018, 3:19 PM

## 2018-03-10 ENCOUNTER — Inpatient Hospital Stay (HOSPITAL_COMMUNITY): Payer: Medicare Other

## 2018-03-10 DIAGNOSIS — I5020 Unspecified systolic (congestive) heart failure: Secondary | ICD-10-CM

## 2018-03-10 LAB — BASIC METABOLIC PANEL
Anion gap: 6 (ref 5–15)
BUN: 10 mg/dL (ref 8–23)
CO2: 28 mmol/L (ref 22–32)
Calcium: 8 mg/dL — ABNORMAL LOW (ref 8.9–10.3)
Chloride: 106 mmol/L (ref 98–111)
Creatinine, Ser: 1.15 mg/dL — ABNORMAL HIGH (ref 0.44–1.00)
GFR calc Af Amer: 56 mL/min — ABNORMAL LOW (ref >60)
GFR calc non Af Amer: 48 mL/min — ABNORMAL LOW (ref >60)
Glucose, Bld: 109 mg/dL — ABNORMAL HIGH (ref 70–99)
Potassium: 3.4 mmol/L — ABNORMAL LOW (ref 3.5–5.1)
Sodium: 140 mmol/L (ref 135–145)

## 2018-03-10 LAB — GLUCOSE, CAPILLARY
Glucose-Capillary: 85 mg/dL (ref 70–99)
Glucose-Capillary: 116 mg/dL — ABNORMAL HIGH (ref 70–99)
Glucose-Capillary: 137 mg/dL — ABNORMAL HIGH (ref 70–99)
Glucose-Capillary: 110 mg/dL — ABNORMAL HIGH (ref 70–99)

## 2018-03-10 LAB — ECHOCARDIOGRAM LIMITED
Height: 71 in
Weight: 4440.95 oz

## 2018-03-10 LAB — CBC
HCT: 37.1 % (ref 36.0–46.0)
Hemoglobin: 11 g/dL — ABNORMAL LOW (ref 12.0–15.0)
MCH: 30.5 pg (ref 26.0–34.0)
MCHC: 29.6 g/dL — ABNORMAL LOW (ref 30.0–36.0)
MCV: 102.8 fL — ABNORMAL HIGH (ref 80.0–100.0)
Platelets: 153 10*3/uL (ref 150–400)
RBC: 3.61 MIL/uL — ABNORMAL LOW (ref 3.87–5.11)
RDW: 16.1 % — ABNORMAL HIGH (ref 11.5–15.5)
WBC: 5.8 10*3/uL (ref 4.0–10.5)
nRBC: 0 % (ref 0.0–0.2)

## 2018-03-10 LAB — MAGNESIUM: Magnesium: 1.8 mg/dL (ref 1.7–2.4)

## 2018-03-10 MED ORDER — FUROSEMIDE 10 MG/ML IJ SOLN
40.0000 mg | Freq: Two times a day (BID) | INTRAMUSCULAR | Status: DC
  Administered 2018-03-11 – 2018-03-14 (×6): 40 mg via INTRAVENOUS
  Filled 2018-03-10 (×7): qty 4

## 2018-03-10 MED ORDER — POTASSIUM CHLORIDE CRYS ER 20 MEQ PO TBCR
40.0000 meq | EXTENDED_RELEASE_TABLET | Freq: Once | ORAL | Status: AC
  Administered 2018-03-10: 40 meq via ORAL
  Filled 2018-03-10: qty 2

## 2018-03-10 MED ORDER — BISMUTH SUBSALICYLATE 262 MG/15ML PO SUSP
30.0000 mL | ORAL | Status: DC | PRN
  Administered 2018-03-10 – 2018-03-15 (×6): 30 mL via ORAL
  Filled 2018-03-10: qty 236

## 2018-03-10 MED ORDER — POTASSIUM CHLORIDE 20 MEQ PO PACK
40.0000 meq | PACK | Freq: Once | ORAL | Status: DC
  Filled 2018-03-10: qty 2

## 2018-03-10 NOTE — Progress Notes (Signed)
Late entry, while doing rounds,offered lasix to pt but still declines stating she does not want it tonight as she has too much  Going on, asking for pepto bismol and given per prn order, pt with MASD but refuses powder or moisture barrier, pt fall risk and wants to get up by herself, instructed on importance of safety and fall prevention " I do this at home and I need to keep doing it"

## 2018-03-10 NOTE — Progress Notes (Signed)
PROGRESS NOTE    Kaitlyn [name]  GEX:[id number] DOB: 09/16/1950 DOA: 03/08/2018 PCP: Laurey Morale, MD   Brief Narrative:  Kaitlyn [name] is Kaitlyn [name] 67 y.o. female with medical history significant of systolic CHF; DM; and afib presenting with afib with RVR. She has afib and has had trouble breathing the last few days.  Saturday AM, her TV, internet, and phone were disconnected.  She was trying to get help and her neighbor finally called 911 today.  +intermittent heart racing.  She last took medicines about 2 days ago.  +SOB.  No cough.  No fever.  +LE edema.  Increasing mobility issues recently (days to weeks).  She reports medication compliance to me, but her dig level is persistently <0.2 including today and she told cardiology that she doesn't take her Lasix due to difficulty getting to the bathroom.  She also reported during prior hospitalization that she does not have Joshva Labreck car and cannot afford Arend Bahl taxi to appointments and so her only access to care is via EMS/ER.  Based on the fact that she has never before been to an outpatient clinic appointment, the heart failure team has terminated the patient from their practice and she will not be able to be seen by [facility] cardiologists.   Assessment & Plan:   Principal Problem:   Acute on chronic combined systolic and diastolic heart failure (HCC) Active Problems:   H/O noncompliance with medical treatment, presenting hazards to health   Essential hypertension   Diabetes type 2, uncontrolled (HCC)   Seizures (HCC)   Atrial fibrillation with rapid ventricular response (HCC)   Obesity, Class III, BMI 40-49.9 (morbid obesity) (HCC)   Anemia   Acute on chronic CHF exacerbation -Patient with prior echo in 3/18 showing EF 30-35% with insufficient diastolic evaluation -She was admitted in 7/19 and echo was insufficient to further evaluate but she diuresed significantly -CXR with cardiomegaly and possible mild interstitial edema (vs chronic interstitial lung  disease) -repeat echo 10/24 with EF 20-25%, recommended limited study with echo contrast to better evaluate wall motion and exclude LV thrombus, but repeat study today limited.  Discussed with Dr. Rosemary Holms who notes no need to repeat during this hospitalization, follow outpatient.  -Markedly elevated BNP -Afib with RVR on presentation likely contributing to CHF  -Continue arb/beta blocker.  ASA started by admitting provider, but will d/c as pt on eliquis and no hx of MI/CVA that I see documented. - continue IV lasix, making good UOP - appreciate Dr. Joaquim Nam, pt dismissed from [facility] practice - she declines coronary angiography on discussion with cards,  Wt Readings from Last 3 Encounters:  03/10/18 125.9 kg  12/07/17 122.5 kg  11/29/17 122.5 kg   Elevated Creatinine: follow with continued diuresis  Afib with RVR -Continue metop and digoxin, rate now improved -Continue eliquis -dig level again undetectable - TSH wnl  DM - A1c 6.4 -hold Glucophage -Cover with moderate-scale SSI  HTN -Continue Cozaar and Toprol XL  Medication noncomplaince -Patient may have underlying psych condition preventing her from being compliant -She lives alone and it is not clear this is safe -APS consult placed by prior physician, will need to follow up with social work regarding this (discussed with CM today) - agree with Dr. Rosemary Holms, she'd benefit from home health or placement  Anemia -Appears to be stable, will follow  Obesity -She appears to be sedentary and without significant resources and so this may be problematic  Seizures -Remote h/o -No longer apparently taking medications for  this issue  Hyponatremia: replete, follow  DVT prophylaxis: eliquis Code Status: full  Family Communication: none at bedside Disposition Plan: pending improvement, needs several days of additional diuresis   Consultants:   cardiology  Procedures:   Echo pending  Antimicrobials:    Anti-infectives (From admission, onward)   None     Subjective: No complaints at this time. Legs still edematous, but feet seem better.  Objective: Vitals:   03/10/18 0633 03/10/18 0634 03/10/18 0935 03/10/18 1320  BP:  (!) 151/89 128/81 126/85  Pulse:  92 (!) 103 95  Resp:  19  19  Temp:  97.9 F (36.6 C)  98.3 F (36.8 C)  TempSrc:  Oral  Oral  SpO2:  92% 94% 91%  Weight: 125.9 kg     Height:        Intake/Output Summary (Last 24 hours) at 03/10/2018 2012 Last data filed at 03/10/2018 1940 Gross per 24 hour  Intake 1080 ml  Output 4400 ml  Net -3320 ml   Filed Weights   03/08/18 1225 03/09/18 0700 03/10/18 0633  Weight: (!) 147.4 kg 129.8 kg 125.9 kg    Examination:  General: No acute distress. Cardiovascular: Heart sounds show Ebonye Reade regular rate, and rhythm Lungs: Clear to auscultation bilaterally Abdomen: Soft, nontender, nondistended  Neurological: Alert and oriented 3. Moves all extremities 4. Cranial nerves II through XII grossly intact. Skin: Warm and dry. No rashes or lesions. Extremities: No clubbing or cyanosis. 2+ [name]. Psychiatric: Mood and affect are normal. Insight and judgment are appropriate.      Data Reviewed: I have personally reviewed following labs and imaging studies  CBC: Recent Labs  Lab 03/08/18 1235 03/09/18 0448 03/10/18 0730  WBC 7.5 7.5 5.8  NEUTROABS  --  5.3  --   HGB 11.1* 11.0* 11.0*  HCT 38.6 37.3 37.1  MCV 103.5* 101.9* 102.8*  PLT 164 164 153   Basic Metabolic Panel: Recent Labs  Lab 03/08/18 1235 03/09/18 0448 03/10/18 0730  NA 139 140 140  K 3.4* 3.4* 3.4*  CL 109 107 106  CO2 24 26 28   GLUCOSE 120* 102* 109*  BUN 8 8 10   CREATININE 0.96 1.04* 1.15*  CALCIUM 8.3* 8.1* 8.0*  MG  --   --  1.8   GFR: Estimated Creatinine Clearance: 69.5 mL/min (Jakobee Brackins) (by C-G formula based on SCr of 1.15 mg/dL (H)). Liver Function Tests: No results for input(s): AST, ALT, ALKPHOS, BILITOT, PROT, ALBUMIN in the last 168  hours. No results for input(s): LIPASE, AMYLASE in the last 168 hours. No results for input(s): AMMONIA in the last 168 hours. Coagulation Profile: No results for input(s): INR, PROTIME in the last 168 hours. Cardiac Enzymes: No results for input(s): CKTOTAL, CKMB, CKMBINDEX, TROPONINI in the last 168 hours. BNP (last 3 results) No results for input(s): PROBNP in the last 8760 hours. HbA1C: Recent Labs    03/08/18 1928  HGBA1C 6.4*   CBG: Recent Labs  Lab 03/09/18 1657 03/09/18 2140 03/10/18 0828 03/10/18 1104 03/10/18 1644  GLUCAP 107* 122* 85 116* 137*   Lipid Profile: No results for input(s): CHOL, HDL, LDLCALC, TRIG, CHOLHDL, LDLDIRECT in the last 72 hours. Thyroid Function Tests: Recent Labs    03/08/18 1928  TSH 3.370   Anemia Panel: No results for input(s): VITAMINB12, FOLATE, FERRITIN, TIBC, IRON, RETICCTPCT in the last 72 hours. Sepsis Labs: No results for input(s): PROCALCITON, LATICACIDVEN in the last 168 hours.  No results found for this or any previous visit (  from the past 240 hour(s)).       Radiology Studies: No results found.      Scheduled Meds: . apixaban  5 mg Oral BID  . digoxin  0.125 mg Oral Daily  . [START ON 03/11/2018] furosemide  40 mg Intravenous BID  . insulin aspart  0-15 Units Subcutaneous TID WC  . insulin aspart  0-5 Units Subcutaneous QHS  . losartan  25 mg Oral Daily  . metoprolol succinate  50 mg Oral BID  . sodium chloride flush  3 mL Intravenous Q12H   Continuous Infusions: . sodium chloride       LOS: 2 days    Time spent: over 30 min    Lacretia Nicks, MD [facility] Pager [phone]  If 7PM-7AM, please contact night-coverage [email or web address] Password [email or web address] 03/10/2018, 8:12 PM

## 2018-03-10 NOTE — Progress Notes (Signed)
  Echocardiogram 2D Echocardiogram has been performed.  Celene Skeen 03/10/2018, 1:25 PM

## 2018-03-10 NOTE — Progress Notes (Addendum)
Subjective:  Feels better  Good diuresis 5.6 L out  Objective:  Vital Signs in the last 24 hours: Temp:  [97.9 F (36.6 C)-98.1 F (36.7 C)] 97.9 F (36.6 C) (10/25 0634) Pulse Rate:  [92-103] 103 (10/25 0935) Resp:  [19] 19 (10/25 0634) BP: (118-151)/(79-89) 128/81 (10/25 0935) SpO2:  [91 %-94 %] 94 % (10/25 0935) Weight:  [125.9 kg] 125.9 kg (10/25 7425)  Intake/Output from previous day: 10/24 0701 - 10/25 0700 In: 123 [P.O.:120; I.V.:3] Out: 5800 [Urine:5800] Intake/Output from this shift: Total I/O In: 120 [P.O.:120] Out: 1100 [Urine:1100]  Physical Exam: Nursing note and vitals reviewed. Constitutional: She is oriented to person, place, and time. She appears well-developed and well-nourished.  Morbidly obese  HENT:  Head: Normocephalic and atraumatic.  Neck: Neck supple. JVD present.  Cardiovascular:  Distal pulses difficult to palpate due to edema  Respiratory: Effort normal and breath sounds normal. She has no wheezes. She has no rales.  GI: Soft. Bowel sounds are normal. There is no tenderness.  Musculoskeletal: She exhibits edema (2+ b/l).  Lymphadenopathy:    She has no cervical adenopathy.  Neurological: She is alert and oriented to person, place, and time. She has normal reflexes.  Skin: Skin is warm and dry.  Psychiatric: She has a normal mood and affect.   Lab Results: Recent Labs    03/09/18 0448 03/10/18 0730  WBC 7.5 5.8  HGB 11.0* 11.0*  PLT 164 153   Recent Labs    03/09/18 0448 03/10/18 0730  NA 140 140  K 3.4* 3.4*  CL 107 106  CO2 26 28  GLUCOSE 102* 109*  BUN 8 10  CREATININE 1.04* 1.15*   Cardiac studies: EKG 03/09/2018: Sinus rhythm. Nonspecific ST-T changes. Borderline prolonged QTc interval  I personally reviewed echocardiogram study from 03/09/2018 and 03/10/2018 with Definity contrast. Today's study adequate today and rules out any LV thrombus.  Hospital echocardiogram 11/24/2017:  - Left ventricle: Poot acoustic  windows and ectopy make evaluation of LVEF difficult LVEFF appearss mildly depressed with septal and inferior hypokinesis. WOuld recomm limited echo with Definity to further define. The cavity size was mildly dilated. Wall thickness was increased in a pattern of mild LVH. - Aortic valve: There was mild regurgitation. - Mitral valve: Calcified annulus. Mildly thickened leaflets . There was mild regurgitation. - Left atrium: The atrium was moderately dilated. - Right ventricle: The cavity size was mildly dilated. - Right atrium: The atrium was mildly dilated. - Pericardium, extracardiac: A trivial pericardial effusion was identified.  Hospital echocardiogram 07/26/2016: Study Conclusions  - Left ventricle: The cavity size was normal. Wall thickness was increased in a pattern of mild LVH. Systolic function was moderately to severely reduced. The estimated ejection fraction was in the range of 30% to 35%. Wall motion was normal; there were no regional wall motion abnormalities. The study is not technically sufficient to allow evaluation of LV diastolic function. - Mitral valve: Calcified annulus. - Left atrium: The atrium was severely dilated. Volume/bsa, ES (1-plane Simpson&'s, A4C): 74.4 ml/m^2. - Right atrium: The atrium was mildly dilated.  Impressions:  - EF is mildly improved when compared to prior study (20-25%)   Assessment/Recommendations:  67 y/o female with long standing cardiomyopathy of uncertain etiology, EF 30-35%, persistent atrial fibrillation, morbid obesity, hypertension, type 2 DM, h/o seizures, h/o medication and diet noncompliance, admitted with heart failure exacerbation.  Acute on chronic systolic heart failure: Excellent diuresis with IV lasix 40 mg bid. She will likely need two  more days of inpatient diuresis. If discharge over the weekend, recommend PO lasix 40 mg bid.  Recommend strict intake and output and daily weights.   Continue losartan 25 mg daily, metoprol succinate 50 mg daily. Patient is not interested in performing coronary angiography for definitive evaluation of cardiaomyopathy etiology. I had detailed conversation with patient regarding right and lifestyle as well as medication compliance.  Patient has very little insight in her dietary restrictions. I counseled the patien regarding the same, but her compliance remains questionable.   Persistent Afib: CHA2DS2VAsc score 5, annual stroke risk 7.2% Rate currently controlled.  Continue metoprolol and digoxin. Continue Eliquis.  H/o noncompliance: Remains primary limiting factor. This emanates from patient's financial constrains and lack of social support. I think she may benefit from nursing home placement, but patient is not willing to consider this. At the very least, consider home health.   Hypertension:  Controlled. Continue current therapy.  Type 2 DM: Management per the primary team.   LOS: 2 days    Uma Jerde J Ala Capri 03/10/2018, 12:29 PM  Jhayla Podgorski Emiliano Dyer, MD [facility] [place] Pager: [phone] Office: [phone] If no answer Cell [phone]

## 2018-03-10 NOTE — Care Management Important Message (Signed)
Important Message  Patient Details  Name: Kaitlyn [name] MRN: [id number] Date of Birth: [birth year]   Medicare Important Message Given:  Yes    Vanna Sailer 03/10/2018, 11:39 AM

## 2018-03-10 NOTE — Care Management Note (Signed)
Case Management Note  Patient Details  Name: Kaitlyn [name] MRN: [id number] Date of Birth: 1950-09-04  Action/Plan: CM talked to patient at the bedside, she lives alone, unable to do ADL's; offered HHC choice, pt refused, she refused SNF placement also. She wanted a 3:1 but stated that she did not have anywhere to put it and she could empty it and later changed her mind and stated that she did not want it. CM asked pt how could I help her, she stated " nothing." Very difficult case where patient needs assistance but is refusing all services; she will be discharged via non emergent ambulance when stable. CM will continue to follow for progression of care.  Expected Discharge Date:  03/12/18               Expected Discharge Plan:  Home/Self Care  Discharge planning Services  CM Consult  Status of Service:  In process, will continue to follow  [name] [phone] 03/10/2018, 2:28 PM

## 2018-03-10 NOTE — Progress Notes (Signed)
Patient refused Lasix this evening.  She states she does not want to pee too much right now.   I expressed the importance of taking medication and getting the fluid off.   Patient was addiment that she did not want to take it this evening.   Stated she wanted some control over taking her medications.

## 2018-03-11 LAB — CBC
HCT: 36.8 % (ref 36.0–46.0)
Hemoglobin: 11 g/dL — ABNORMAL LOW (ref 12.0–15.0)
MCH: 30.6 pg (ref 26.0–34.0)
MCHC: 29.9 g/dL — ABNORMAL LOW (ref 30.0–36.0)
MCV: 102.2 fL — ABNORMAL HIGH (ref 80.0–100.0)
Platelets: 165 10*3/uL (ref 150–400)
RBC: 3.6 MIL/uL — ABNORMAL LOW (ref 3.87–5.11)
RDW: 16.1 % — ABNORMAL HIGH (ref 11.5–15.5)
WBC: 6.9 10*3/uL (ref 4.0–10.5)
nRBC: 0 % (ref 0.0–0.2)

## 2018-03-11 LAB — BASIC METABOLIC PANEL
Anion gap: 6 (ref 5–15)
BUN: 14 mg/dL (ref 8–23)
CO2: 28 mmol/L (ref 22–32)
Calcium: 8.1 mg/dL — ABNORMAL LOW (ref 8.9–10.3)
Chloride: 104 mmol/L (ref 98–111)
Creatinine, Ser: 1.03 mg/dL — ABNORMAL HIGH (ref 0.44–1.00)
GFR calc Af Amer: >60 mL/min (ref >60)
GFR calc non Af Amer: 55 mL/min — ABNORMAL LOW (ref >60)
Glucose, Bld: 108 mg/dL — ABNORMAL HIGH (ref 70–99)
Potassium: 3.5 mmol/L (ref 3.5–5.1)
Sodium: 138 mmol/L (ref 135–145)

## 2018-03-11 LAB — GLUCOSE, CAPILLARY
Glucose-Capillary: 112 mg/dL — ABNORMAL HIGH (ref 70–99)
Glucose-Capillary: 107 mg/dL — ABNORMAL HIGH (ref 70–99)
Glucose-Capillary: 111 mg/dL — ABNORMAL HIGH (ref 70–99)
Glucose-Capillary: 127 mg/dL — ABNORMAL HIGH (ref 70–99)

## 2018-03-11 LAB — MAGNESIUM: Magnesium: 1.7 mg/dL (ref 1.7–2.4)

## 2018-03-11 NOTE — Progress Notes (Signed)
PROGRESS NOTE    Kaitlyn [name]  ZOX:[id number] DOB: [birth year] DOA: 03/08/2018 PCP: Laurey Morale, MD   Brief Narrative:  Kaitlyn [name] is Kaitlyn [name] 67 y.o. female with medical history significant of systolic CHF; DM; and afib presenting with afib with RVR. She has afib and has had trouble breathing the last few days.  Saturday AM, her TV, internet, and phone were disconnected.  She was trying to get help and her neighbor finally called 911 today.  +intermittent heart racing.  She last took medicines about 2 days ago.  +SOB.  No cough.  No fever.  +LE edema.  Increasing mobility issues recently (days to weeks).  She reports medication compliance to me, but her dig level is persistently <0.2 including today and she told cardiology that she doesn't take her Lasix due to difficulty getting to the bathroom.  She also reported during prior hospitalization that she does not have Kaitlyn [name] car and cannot afford Kaitlyn [name] taxi to appointments and so her only access to care is via EMS/ER.  Based on the fact that she has never before been to an outpatient clinic appointment, the heart failure team has terminated the patient from their practice and she will not be able to be seen by [facility] cardiologists.  Assessment & Plan:   Principal Problem:   Acute on chronic combined systolic and diastolic heart failure (HCC) Active Problems:   H/O noncompliance with medical treatment, presenting hazards to health   Essential hypertension   Diabetes type 2, uncontrolled (HCC)   Seizures (HCC)   Atrial fibrillation with rapid ventricular response (HCC)   Obesity, Class III, BMI 40-49.9 (morbid obesity) (HCC)   Anemia   Acute on chronic CHF exacerbation -Patient with prior echo in 3/18 showing EF 30-35% with insufficient diastolic evaluation -She was admitted in 7/19 and echo was insufficient to further evaluate but she diuresed significantly -CXR with cardiomegaly and possible mild interstitial edema (vs chronic interstitial lung  disease) -repeat echo 10/24 with EF 20-25%, recommended limited study with echo contrast to better evaluate wall motion and exclude LV thrombus, but repeat study today limited.  Discussed with Dr. Rosemary Holms who notes no need to repeat during this hospitalization, follow outpatient.  -Markedly elevated BNP -Afib with RVR on presentation likely contributing to CHF  -Continue arb/beta blocker.  ASA started by admitting provider, but will d/c as pt on eliquis and no hx of MI/CVA that I see documented. - continue IV lasix, making good UOP.  Weight down from 125.9 kg to 125.3, follow. - appreciate Dr. Joaquim Nam, pt dismissed from [facility] practice - she declines coronary angiography on discussion with cards,  Wt Readings from Last 3 Encounters:  03/11/18 125.3 kg  12/07/17 122.5 kg  11/29/17 122.5 kg   Elevated Creatinine: follow with continued diuresis  Afib with RVR -Continue metop and digoxin, rate now improved -Continue eliquis -dig level again undetectable - TSH wnl  DM - A1c 6.4 -hold Glucophage -Cover with moderate-scale SSI  HTN -Continue Cozaar and Toprol XL  Medication noncomplaince -Patient may have underlying psych condition preventing her from being compliant -She lives alone and it is not clear this is safe -APS consult placed by prior physician, will need to follow up with social work regarding this (discussed with CM today) - agree with Dr. Rosemary Holms, she'd benefit from home health or placement, but she's refusing to work with PT.  Will continue to discuss with her.  Anemia -Appears to be stable, will follow  Obesity -She appears to  be sedentary and without significant resources and so this may be problematic  Seizures -Remote h/o -No longer apparently taking medications for this issue  Hyponatremia: replete, follow  DVT prophylaxis: eliquis Code Status: full  Family Communication: none at bedside Disposition Plan: pending improvement, needs several  days of additional diuresis   Consultants:   cardiology  Procedures:   Echo pending  Antimicrobials:  Anti-infectives (From admission, onward)   None     Subjective: Does not want to work with PT.  Says she's too tired. Feels swelling is improving.  Objective: Vitals:   03/10/18 1320 03/10/18 2045 03/11/18 0449 03/11/18 1254  BP: 126/85 117/74 123/80 120/74  Pulse: 95 98 91 85  Resp: 19 18 16 17   Temp: 98.3 F (36.8 C) 97.6 F (36.4 C) 98.4 F (36.9 C) 98 F (36.7 C)  TempSrc: Oral Oral Oral Oral  SpO2: 91% 94% 92% 96%  Weight:   125.3 kg   Height:        Intake/Output Summary (Last 24 hours) at 03/11/2018 1739 Last data filed at 03/11/2018 1458 Gross per 24 hour  Intake 1303 ml  Output 4250 ml  Net -2947 ml   Filed Weights   03/09/18 0700 03/10/18 0633 03/11/18 0449  Weight: 129.8 kg 125.9 kg 125.3 kg    Examination:  General: No acute distress.  Obese. Cardiovascular: Heart sounds show Kaitlyn [name] regular rate, and rhythm. Lungs: Clear to auscultation bilaterally  Abdomen: Soft, nontender, nondistended Neurological: Alert and oriented 3. Moves all extremities 4. Cranial nerves II through XII grossly intact. Skin: Warm and dry. No rashes or lesions. Extremities: No clubbing or cyanosis. 2+ [name] Psychiatric: Mood and affect are normal. Insight and judgment are appropriate.     Data Reviewed: I have personally reviewed following labs and imaging studies  CBC: Recent Labs  Lab 03/08/18 1235 03/09/18 0448 03/10/18 0730 03/11/18 0617  WBC 7.5 7.5 5.8 6.9  NEUTROABS  --  5.3  --   --   HGB 11.1* 11.0* 11.0* 11.0*  HCT 38.6 37.3 37.1 36.8  MCV 103.5* 101.9* 102.8* 102.2*  PLT 164 164 153 165   Basic Metabolic Panel: Recent Labs  Lab 03/08/18 1235 03/09/18 0448 03/10/18 0730 03/11/18 0617  NA 139 140 140 138  K 3.4* 3.4* 3.4* 3.5  CL 109 107 106 104  CO2 24 26 28 28   GLUCOSE 120* 102* 109* 108*  BUN 8 8 10 14   CREATININE 0.96 1.04* 1.15*  1.03*  CALCIUM 8.3* 8.1* 8.0* 8.1*  MG  --   --  1.8 1.7   GFR: Estimated Creatinine Clearance: 77.5 mL/min (Kaitlyn [name]) (by C-G formula based on SCr of 1.03 mg/dL (H)). Liver Function Tests: No results for input(s): AST, ALT, ALKPHOS, BILITOT, PROT, ALBUMIN in the last 168 hours. No results for input(s): LIPASE, AMYLASE in the last 168 hours. No results for input(s): AMMONIA in the last 168 hours. Coagulation Profile: No results for input(s): INR, PROTIME in the last 168 hours. Cardiac Enzymes: No results for input(s): CKTOTAL, CKMB, CKMBINDEX, TROPONINI in the last 168 hours. BNP (last 3 results) No results for input(s): PROBNP in the last 8760 hours. HbA1C: Recent Labs    03/08/18 1928  HGBA1C 6.4*   CBG: Recent Labs  Lab 03/10/18 1644 03/10/18 2137 03/11/18 0732 03/11/18 1251 03/11/18 1614  GLUCAP 137* 110* 112* 107* 111*   Lipid Profile: No results for input(s): CHOL, HDL, LDLCALC, TRIG, CHOLHDL, LDLDIRECT in the last 72 hours. Thyroid Function Tests: Recent Labs  03/08/18 1928  TSH 3.370   Anemia Panel: No results for input(s): VITAMINB12, FOLATE, FERRITIN, TIBC, IRON, RETICCTPCT in the last 72 hours. Sepsis Labs: No results for input(s): PROCALCITON, LATICACIDVEN in the last 168 hours.  No results found for this or any previous visit (from the past 240 hour(s)).       Radiology Studies: No results found.      Scheduled Meds: . apixaban  5 mg Oral BID  . digoxin  0.125 mg Oral Daily  . furosemide  40 mg Intravenous BID  . insulin aspart  0-15 Units Subcutaneous TID WC  . insulin aspart  0-5 Units Subcutaneous QHS  . losartan  25 mg Oral Daily  . metoprolol succinate  50 mg Oral BID  . potassium chloride  40 mEq Oral Once  . sodium chloride flush  3 mL Intravenous Q12H   Continuous Infusions: . sodium chloride       LOS: 3 days    Time spent: over 30 min    Lacretia Nicks, MD [facility] Pager [phone]  If 7PM-7AM, please  contact night-coverage [email or web address] Password [facility] 03/11/2018, 5:39 PM

## 2018-03-11 NOTE — Discharge Instructions (Signed)

## 2018-03-12 LAB — GLUCOSE, CAPILLARY
Glucose-Capillary: 119 mg/dL — ABNORMAL HIGH (ref 70–99)
Glucose-Capillary: 129 mg/dL — ABNORMAL HIGH (ref 70–99)
Glucose-Capillary: 85 mg/dL (ref 70–99)
Glucose-Capillary: 129 mg/dL — ABNORMAL HIGH (ref 70–99)

## 2018-03-12 LAB — BASIC METABOLIC PANEL WITH GFR
Anion gap: 7 (ref 5–15)
BUN: 14 mg/dL (ref 8–23)
CO2: 24 mmol/L (ref 22–32)
Calcium: 8.3 mg/dL — ABNORMAL LOW (ref 8.9–10.3)
Chloride: 108 mmol/L (ref 98–111)
Creatinine, Ser: 0.98 mg/dL (ref 0.44–1.00)
GFR calc Af Amer: >60 mL/min
GFR calc non Af Amer: 58 mL/min — ABNORMAL LOW
Glucose, Bld: 118 mg/dL — ABNORMAL HIGH (ref 70–99)
Potassium: 3.8 mmol/L (ref 3.5–5.1)
Sodium: 139 mmol/L (ref 135–145)

## 2018-03-12 LAB — CBC
HCT: 37.5 % (ref 36.0–46.0)
Hemoglobin: 10.9 g/dL — ABNORMAL LOW (ref 12.0–15.0)
MCH: 29.6 pg (ref 26.0–34.0)
MCHC: 29.1 g/dL — ABNORMAL LOW (ref 30.0–36.0)
MCV: 101.9 fL — ABNORMAL HIGH (ref 80.0–100.0)
Platelets: 167 K/uL (ref 150–400)
RBC: 3.68 MIL/uL — ABNORMAL LOW (ref 3.87–5.11)
RDW: 16 % — ABNORMAL HIGH (ref 11.5–15.5)
WBC: 6.8 K/uL (ref 4.0–10.5)
nRBC: 0 % (ref 0.0–0.2)

## 2018-03-12 LAB — MAGNESIUM: Magnesium: 1.9 mg/dL (ref 1.7–2.4)

## 2018-03-12 NOTE — Progress Notes (Signed)
Telemetry order expired, paged Dr Lowell Guitar to Francee Piccolo if renew or remove

## 2018-03-12 NOTE — Progress Notes (Signed)
PROGRESS NOTE    Kaitlyn [name]  ZWC:[id number] DOB: 1950-08-11 DOA: 03/08/2018 PCP: Laurey Morale, MD   Brief Narrative:  Kaitlyn [name] is Kaitlyn [name] 67 y.o. female with medical history significant of systolic CHF; DM; and afib presenting with afib with RVR. She has afib and has had trouble breathing the last few days.  Saturday AM, her TV, internet, and phone were disconnected.  She was trying to get help and her neighbor finally called 911 today.  +intermittent heart racing.  She last took medicines about 2 days ago.  +SOB.  No cough.  No fever.  +LE edema.  Increasing mobility issues recently (days to weeks).  She reports medication compliance to me, but her dig level is persistently <0.2 including today and she told cardiology that she doesn't take her Lasix due to difficulty getting to the bathroom.  She also reported during prior hospitalization that she does not have Helga Asbury car and cannot afford Blia Totman taxi to appointments and so her only access to care is via EMS/ER.  Based on the fact that she has never before been to an outpatient clinic appointment, the heart failure team has terminated the patient from their practice and she will not be able to be seen by [facility] cardiologists.  Assessment & Plan:   Principal Problem:   Acute on chronic combined systolic and diastolic heart failure (HCC) Active Problems:   H/O noncompliance with medical treatment, presenting hazards to health   Essential hypertension   Diabetes type 2, uncontrolled (HCC)   Seizures (HCC)   Atrial fibrillation with rapid ventricular response (HCC)   Obesity, Class III, BMI 40-49.9 (morbid obesity) (HCC)   Anemia   Acute on chronic CHF exacerbation -Patient with prior echo in 3/18 showing EF 30-35% with insufficient diastolic evaluation -She was admitted in 7/19 and echo was insufficient to further evaluate but she diuresed significantly -CXR with cardiomegaly and possible mild interstitial edema (vs chronic interstitial lung  disease) -repeat echo 10/24 with EF 20-25%, recommended limited study with echo contrast to better evaluate wall motion and exclude LV thrombus, but repeat study today limited.  Discussed with Dr. Rosemary Holms who notes no need to repeat during this hospitalization, follow outpatient.  -Markedly elevated BNP -Afib with RVR on presentation likely contributing to CHF  -Continue arb/beta blocker.  ASA started by admitting provider, but will d/c as pt on eliquis and no hx of MI/CVA that I see documented. - continue IV lasix, making good UOP.  Weight down to 121.2 kg today.  Hopefully can discharge tomorrow if continuing to do well. - appreciate Dr. Joaquim Nam, pt dismissed from [facility] practice - she declines coronary angiography on discussion with cards - Unfortunately she notes she won't use lasix at home given concern for frequent urination  Wt Readings from Last 3 Encounters:  03/12/18 121.2 kg  12/07/17 122.5 kg  11/29/17 122.5 kg   Elevated Creatinine: follow with continued diuresis  Afib with RVR -Continue metop and digoxin, rate now improved -Continue eliquis -dig level again undetectable - TSH wnl  DM - A1c 6.4 -hold Glucophage -Cover with moderate-scale SSI  HTN -Continue Cozaar and Toprol XL  Medication noncomplaince -Patient may have underlying psych condition preventing her from being compliant -She lives alone and it is not clear this is safe -APS consult placed by prior physician, will need to follow up with social work regarding this (discussed with CM).  Will need to follow up. - agree with Dr. Rosemary Holms, she'd benefit from home health or  placement, but she's refusing this.  Will continue to discuss with her.  Anemia -Appears to be stable, will follow  Obesity -She appears to be sedentary and without significant resources and so this may be problematic  Seizures -Remote h/o -No longer apparently taking medications for this issue  Hyponatremia: replete,  follow  DVT prophylaxis: eliquis Code Status: full  Family Communication: none at bedside Disposition Plan: Likely within 24 hours if continues to improve   Consultants:   cardiology  Procedures:   Echo pending  Antimicrobials:  Anti-infectives (From admission, onward)   None     Subjective: Repeats she won't use lasix at home. Feeling better. Refusing some lasix doses. Encouraged compliance.  Objective: Vitals:   03/11/18 1254 03/11/18 2001 03/12/18 0409 03/12/18 1216  BP: 120/74 127/61 (!) 138/93 126/80  Pulse: 85 (!) 103 89 99  Resp: 17 18 18 20   Temp: 98 F (36.7 C) 97.9 F (36.6 C) 97.7 F (36.5 C) 98.2 F (36.8 C)  TempSrc: Oral Oral Oral Oral  SpO2: 96% 92% 98%   Weight:   121.2 kg   Height:        Intake/Output Summary (Last 24 hours) at 03/12/2018 1610 Last data filed at 03/12/2018 1300 Gross per 24 hour  Intake 1083 ml  Output 3450 ml  Net -2367 ml   Filed Weights   03/10/18 0633 03/11/18 0449 03/12/18 0409  Weight: 125.9 kg 125.3 kg 121.2 kg    Examination:  General: No acute distress. Cardiovascular: Heart sounds show Lenna Hagarty regular rate, and rhythm. Lungs: Clear to auscultation bilaterally Abdomen: Soft, nontender, nondistended Neurological: Alert and oriented 3. Moves all extremities 4. Cranial nerves II through XII grossly intact. Skin: Warm and dry. No rashes or lesions. Extremities: No clubbing or cyanosis. Bilateral [name].    Data Reviewed: I have personally reviewed following labs and imaging studies  CBC: Recent Labs  Lab 03/08/18 1235 03/09/18 0448 03/10/18 0730 03/11/18 0617 03/12/18 0649  WBC 7.5 7.5 5.8 6.9 6.8  NEUTROABS  --  5.3  --   --   --   HGB 11.1* 11.0* 11.0* 11.0* 10.9*  HCT 38.6 37.3 37.1 36.8 37.5  MCV 103.5* 101.9* 102.8* 102.2* 101.9*  PLT 164 164 153 165 167   Basic Metabolic Panel: Recent Labs  Lab 03/08/18 1235 03/09/18 0448 03/10/18 0730 03/11/18 0617 03/12/18 0649  NA 139 140 140 138 139   K 3.4* 3.4* 3.4* 3.5 3.8  CL 109 107 106 104 108  CO2 24 26 28 28 24   GLUCOSE 120* 102* 109* 108* 118*  BUN 8 8 10 14 14   CREATININE 0.96 1.04* 1.15* 1.03* 0.98  CALCIUM 8.3* 8.1* 8.0* 8.1* 8.3*  MG  --   --  1.8 1.7 1.9   GFR: Estimated Creatinine Clearance: 80 mL/min (by C-G formula based on SCr of 0.98 mg/dL). Liver Function Tests: No results for input(s): AST, ALT, ALKPHOS, BILITOT, PROT, ALBUMIN in the last 168 hours. No results for input(s): LIPASE, AMYLASE in the last 168 hours. No results for input(s): AMMONIA in the last 168 hours. Coagulation Profile: No results for input(s): INR, PROTIME in the last 168 hours. Cardiac Enzymes: No results for input(s): CKTOTAL, CKMB, CKMBINDEX, TROPONINI in the last 168 hours. BNP (last 3 results) No results for input(s): PROBNP in the last 8760 hours. HbA1C: No results for input(s): HGBA1C in the last 72 hours. CBG: Recent Labs  Lab 03/11/18 1251 03/11/18 1614 03/11/18 2127 03/12/18 0803 03/12/18 1219  GLUCAP 107*  111* 127* 119* 129*   Lipid Profile: No results for input(s): CHOL, HDL, LDLCALC, TRIG, CHOLHDL, LDLDIRECT in the last 72 hours. Thyroid Function Tests: No results for input(s): TSH, T4TOTAL, FREET4, T3FREE, THYROIDAB in the last 72 hours. Anemia Panel: No results for input(s): VITAMINB12, FOLATE, FERRITIN, TIBC, IRON, RETICCTPCT in the last 72 hours. Sepsis Labs: No results for input(s): PROCALCITON, LATICACIDVEN in the last 168 hours.  No results found for this or any previous visit (from the past 240 hour(s)).       Radiology Studies: No results found.      Scheduled Meds: . apixaban  5 mg Oral BID  . digoxin  0.125 mg Oral Daily  . furosemide  40 mg Intravenous BID  . insulin aspart  0-15 Units Subcutaneous TID WC  . insulin aspart  0-5 Units Subcutaneous QHS  . losartan  25 mg Oral Daily  . metoprolol succinate  50 mg Oral BID  . potassium chloride  40 mEq Oral Once  . sodium chloride flush   3 mL Intravenous Q12H   Continuous Infusions: . sodium chloride       LOS: 4 days    Time spent: over 30 min    Lacretia Nicks, MD [facility] Pager [phone]  If 7PM-7AM, please contact night-coverage [email or web address] Password [email or web address] 03/12/2018, 4:10 PM

## 2018-03-13 LAB — CBC
HCT: 38.7 % (ref 36.0–46.0)
Hemoglobin: 11.6 g/dL — ABNORMAL LOW (ref 12.0–15.0)
MCH: 30.4 pg (ref 26.0–34.0)
MCHC: 30 g/dL (ref 30.0–36.0)
MCV: 101.3 fL — ABNORMAL HIGH (ref 80.0–100.0)
Platelets: 155 10*3/uL (ref 150–400)
RBC: 3.82 MIL/uL — ABNORMAL LOW (ref 3.87–5.11)
RDW: 15.9 % — ABNORMAL HIGH (ref 11.5–15.5)
WBC: 6.1 10*3/uL (ref 4.0–10.5)
nRBC: 0 % (ref 0.0–0.2)

## 2018-03-13 LAB — GLUCOSE, CAPILLARY
Glucose-Capillary: 112 mg/dL — ABNORMAL HIGH (ref 70–99)
Glucose-Capillary: 134 mg/dL — ABNORMAL HIGH (ref 70–99)
Glucose-Capillary: 114 mg/dL — ABNORMAL HIGH (ref 70–99)
Glucose-Capillary: 121 mg/dL — ABNORMAL HIGH (ref 70–99)

## 2018-03-13 LAB — BASIC METABOLIC PANEL
Anion gap: 6 (ref 5–15)
BUN: 13 mg/dL (ref 8–23)
CO2: 26 mmol/L (ref 22–32)
Calcium: 8.6 mg/dL — ABNORMAL LOW (ref 8.9–10.3)
Chloride: 106 mmol/L (ref 98–111)
Creatinine, Ser: 1.02 mg/dL — ABNORMAL HIGH (ref 0.44–1.00)
GFR calc Af Amer: >60 mL/min (ref >60)
GFR calc non Af Amer: 56 mL/min — ABNORMAL LOW (ref >60)
Glucose, Bld: 144 mg/dL — ABNORMAL HIGH (ref 70–99)
Potassium: 3.8 mmol/L (ref 3.5–5.1)
Sodium: 138 mmol/L (ref 135–145)

## 2018-03-13 LAB — MAGNESIUM: Magnesium: 2 mg/dL (ref 1.7–2.4)

## 2018-03-13 MED ORDER — SODIUM CHLORIDE 0.9 % IV BOLUS
250.0000 mL | Freq: Once | INTRAVENOUS | Status: AC
  Administered 2018-03-13: 250 mL via INTRAVENOUS

## 2018-03-13 MED ORDER — FUROSEMIDE 40 MG PO TABS
40.0000 mg | ORAL_TABLET | Freq: Every day | ORAL | Status: DC
  Filled 2018-03-13 (×2): qty 1

## 2018-03-13 NOTE — Progress Notes (Signed)
CM talked to patient about Life Alert. Patient stated " I know what that is and I don't want it." B Shelba Flake [phone]

## 2018-03-13 NOTE — Progress Notes (Signed)
Subjective:  Feels better  Net -12 L for hospital stay  Objective:  Vital Signs in the last 24 hours: Temp:  [97.6 F (36.4 C)-98.6 F (37 C)] 97.6 F (36.4 C) (10/28 0627) Pulse Rate:  [90-108] 108 (10/28 0627) Resp:  [18-20] 18 (10/28 0627) BP: (117-126)/(70-80) 124/79 (10/28 0627) SpO2:  [90 %-96 %] 96 % (10/28 0627) Weight:  [115.6 kg-122.3 kg] 115.6 kg (10/28 0627)  Intake/Output from previous day: 10/27 0701 - 10/28 0700 In: 1203 [P.O.:1200; I.V.:3] Out: 3500 [Urine:3500] Intake/Output from this shift: No intake/output data recorded.  Physical Exam: Nursing note and vitals reviewed. Constitutional: She is oriented to person, place, and time. She appears well-developed and well-nourished.  Morbidly obese  HENT:  Head: Normocephalic and atraumatic.  Neck: Neck supple. JVD improved.  Cardiovascular:  Distal pulses difficult to palpate due to edema  Respiratory: Effort normal and breath sounds normal. She has no wheezes. She has no rales.  GI: Soft. Bowel sounds are normal. There is no tenderness.  Musculoskeletal: She exhibits edema (1+ b/l).  Lymphadenopathy:    She has no cervical adenopathy.  Neurological: She is alert and oriented to person, place, and time. She has normal reflexes.  Skin: Skin is warm and dry.  Psychiatric: She has a normal mood and affect.   Lab Results: Recent Labs    03/12/18 0649 03/13/18 0516  WBC 6.8 6.1  HGB 10.9* 11.6*  PLT 167 155   Recent Labs    03/12/18 0649 03/13/18 0516  NA 139 138  K 3.8 3.8  CL 108 106  CO2 24 26  GLUCOSE 118* 144*  BUN 14 13  CREATININE 0.98 1.02*   Cardiac studies: EKG 03/09/2018: Sinus rhythm. Nonspecific ST-T changes. Borderline prolonged QTc interval  Echocardiogram 03/10/2018: Study Conclusions  - Impressions: Limited images with definity of Lv. Consdier   repeating when patient in less pain   LV is moderately dilated with diffuse appearing hypokinesis EF    25-30%  Impressions:  - Limited images with definity of Lv. Consdier repeating when   patient in less pain   LV is moderately dilated with diffuse appearing hypokinesis EF   25-30%  I personally reviewed echocardiogram study from 03/09/2018 and 03/10/2018 with Definity contrast. Today's study adequate today and rules out any LV thrombus.  Hospital echocardiogram 11/24/2017:  - Left ventricle: Poot acoustic windows and ectopy make evaluation of LVEF difficult LVEFF appearss mildly depressed with septal and inferior hypokinesis. WOuld recomm limited echo with Definity to further define. The cavity size was mildly dilated. Wall thickness was increased in a pattern of mild LVH. - Aortic valve: There was mild regurgitation. - Mitral valve: Calcified annulus. Mildly thickened leaflets . There was mild regurgitation. - Left atrium: The atrium was moderately dilated. - Right ventricle: The cavity size was mildly dilated. - Right atrium: The atrium was mildly dilated. - Pericardium, extracardiac: A trivial pericardial effusion was identified.  Hospital echocardiogram 07/26/2016: Study Conclusions  - Left ventricle: The cavity size was normal. Wall thickness was increased in a pattern of mild LVH. Systolic function was moderately to severely reduced. The estimated ejection fraction was in the range of 30% to 35%. Wall motion was normal; there were no regional wall motion abnormalities. The study is not technically sufficient to allow evaluation of LV diastolic function. - Mitral valve: Calcified annulus. - Left atrium: The atrium was severely dilated. Volume/bsa, ES (1-plane Simpson&'s, A4C): 74.4 ml/m^2. - Right atrium: The atrium was mildly dilated.  Impressions:  -  EF is mildly improved when compared to prior study (20-25%)   Assessment/Recommendations:  67 y/o female with long standing cardiomyopathy of uncertain etiology, EF 30-35%,  persistent atrial fibrillation, morbid obesity, hypertension, type 2 DM, h/o seizures, h/o medication and diet noncompliance, admitted with heart failure exacerbation.  Acute on chronic systolic heart failure: Excellent diuresis with IV lasix 40 mg bid. Add lasix 40 mg PO daily with plans for discharge either 10/29 or 10/30. Patient has agreed to take at least once a day PO lasix at home. With her noncompliance, she remains a high risk for hospital readmission. Recommend strict intake and output and daily weights.  Continue losartan 25 mg daily, metoprol succinate 50 mg daily, digoxin 0.125 mg daily.  Patient is not interested in performing coronary angiography for definitive evaluation of cardiamyopathy etiology. I had detailed conversation with patient regarding right and lifestyle as well as medication compliance.  Patient has very little insight in her dietary restrictions. I counseled the patien regarding the same, but her compliance remains questionable.   Persistent Afib: CHA2DS2VAsc score 5, annual stroke risk 7.2% Rate currently controlled.  Continue metoprolol and digoxin. Continue Eliquis.  H/o noncompliance: Remains primary limiting factor. This emanates from patient's financial constrains and lack of social support. I think she may benefit from nursing home placement, but patient is not willing to consider this. At the very least, consider home health.   Hypertension:  Controlled. Continue current therapy.  Type 2 DM: Management per the primary team.   LOS: 5 days    Graham Doukas J Tammala Weider 03/13/2018, 8:08 AM  Elder Negus, MD [facility] Pager: [phone] Office: [phone] If no answer Cell [phone]

## 2018-03-13 NOTE — Plan of Care (Signed)
Pt has MASD to breast, abd fold and buttocks, refuses moisture barrier and will use antungal powder at times, we have discussed and offered bathing but pt declines, pt states she does not use lasix at home because she has a difficult time getting up due to arthritis and left knee wants to give out, she will refuse evening dose of lasix and only been taking am dose, pt has been educated by staff and md

## 2018-03-13 NOTE — Progress Notes (Signed)
Pt gets up without calling staff for help nor putting her socks on and insists on having the bed alarm turned off. Pt was educated on the importance of calling staff for help before getting. Will continue to monitor.

## 2018-03-13 NOTE — Progress Notes (Signed)
PROGRESS NOTE    Kaitlyn [name]  ZOX:[id number] DOB: [birth year] DOA: 03/08/2018 PCP: Kaitlyn Morale, MD   Brief Narrative:  Kaitlyn [name] is Kaitlyn [name] 67 y.o. female with medical history significant of systolic CHF; DM; and afib presenting with afib with RVR. She has afib and has had trouble breathing the last few days.  Saturday AM, her TV, internet, and phone were disconnected.  She was trying to get help and her neighbor finally called 911 today.  +intermittent heart racing.  She last took medicines about 2 days ago.  +SOB.  No cough.  No fever.  +LE edema.  Increasing mobility issues recently (days to weeks).  She reports medication compliance to me, but her dig level is persistently <0.2 including today and she told cardiology that she doesn't take her Lasix due to difficulty getting to the bathroom.  She also reported during prior hospitalization that she does not have Kaitlyn [name] car and cannot afford Kaitlyn [name] taxi to appointments and so her only access to care is via EMS/ER.  Based on the fact that she has never before been to an outpatient clinic appointment, the heart failure team has terminated the patient from their practice and she will not be able to be seen by [facility] cardiologists.  Assessment & Plan:   Principal Problem:   Acute on chronic combined systolic and diastolic heart failure (HCC) Active Problems:   H/O noncompliance with medical treatment, presenting hazards to health   Essential hypertension   Diabetes type 2, uncontrolled (HCC)   Seizures (HCC)   Atrial fibrillation with rapid ventricular response (HCC)   Obesity, Class III, BMI 40-49.9 (morbid obesity) (HCC)   Anemia   Acute on chronic CHF exacerbation -Patient with prior echo in 3/18 showing EF 30-35% with insufficient diastolic evaluation -She was admitted in 7/19 and echo was insufficient to further evaluate but she diuresed significantly -CXR with cardiomegaly and possible mild interstitial edema (vs chronic interstitial lung  disease) -repeat echo 10/24 with EF 20-25%, recommended limited study with echo contrast to better evaluate wall motion and exclude LV thrombus, but repeat study today limited.  Discussed with Dr. Rosemary [name] who notes no need to repeat during this hospitalization, follow outpatient.  -Markedly elevated BNP -Afib with RVR on presentation likely contributing to CHF  -Continue arb/beta blocker.  ASA started by admitting provider, but will d/c as pt on eliquis and no hx of MI/CVA that I see documented. - continue IV lasix, making good UOP.  Weight down to 115.6 kg today.  Hopefully can discharge tomorrow if continuing to do well. - appreciate Dr. Joaquim [name], pt dismissed from [facility] practice - she declines coronary angiography on discussion with cards - planning for another day or two of diuresis.  - Unfortunately she notes she won't use lasix at home given concern for frequent urination  Wt Readings from Last 3 Encounters:  03/13/18 115.6 kg  12/07/17 122.5 kg  11/29/17 122.5 kg   Elevated Creatinine: follow with continued diuresis, stable  Afib with RVR -Continue metop and digoxin, rate now improved -Continue eliquis - TSH wnl  DM - A1c 6.4 -hold Glucophage -Cover with moderate-scale SSI  HTN -Continue Cozaar and Toprol XL  Medication noncomplaince -Patient may have underlying psych condition preventing her from being compliant -She lives alone and it is not clear this is safe - Child psychotherapist spoke to pt and pt reported no needs.  Gets groceries online and delivered.  Has 2 neighbors that occasionally assist her.  Planning to carry  phone with her in case of emergency.  Social work signed off. - Pt declining life alert, home health, etc.  We spoke about this at length on 10/28.   - agree with Dr. Rosemary [name], she'd benefit from home health or placement, but she's refusing this.  Anemia -Appears to be stable, will follow  Obesity -She appears to be sedentary and without  significant resources and so this may be problematic  Seizures -Remote h/o -No longer apparently taking medications for this issue  Hyponatremia: replete, follow  DVT prophylaxis: eliquis Code Status: full  Family Communication: none at bedside Disposition Plan: 24-48 hrs pending cards s/o   Consultants:   cardiology  Procedures:   Echo pending  Antimicrobials:  Anti-infectives (From admission, onward)   None     Subjective: Feeling better. She notes she's able to cook, clean, bath, dress herself at home. Gets around with walker.  Thinking about using lasix.  Objective: Vitals:   03/12/18 2015 03/13/18 0604 03/13/18 0627 03/13/18 1200  BP: 117/71 120/70 124/79 123/76  Pulse: 98 90 (!) 108 100  Resp: 18 18 18 20   Temp: 97.8 F (36.6 C) 98.6 F (37 C) 97.6 F (36.4 C) 98.3 F (36.8 C)  TempSrc: Oral Oral Oral Oral  SpO2: 90% 93% 96% 93%  Weight:  122.3 kg 115.6 kg   Height:        Intake/Output Summary (Last 24 hours) at 03/13/2018 1918 Last data filed at 03/13/2018 1447 Gross per 24 hour  Intake 1080 ml  Output 4600 ml  Net -3520 ml   Filed Weights   03/12/18 0409 03/13/18 0604 03/13/18 0627  Weight: 121.2 kg 122.3 kg 115.6 kg    Examination:  General: No acute distress. Cardiovascular: Heart sounds show Kaitlyn [name] regular rate, and rhythm Lungs: Clear to auscultation bilaterally  Abdomen: Soft, nontender, nondistended  Neurological: Alert and oriented 3. Moves all extremities 4. Cranial nerves II through XII grossly intact. Skin: Warm and dry. No rashes or lesions. Extremities: No clubbing or cyanosis. Bilateral [name] Psychiatric: Mood and affect are normal. Insight and judgment are appropriate.   Data Reviewed: I have personally reviewed following labs and imaging studies  CBC: Recent Labs  Lab 03/09/18 0448 03/10/18 0730 03/11/18 0617 03/12/18 0649 03/13/18 0516  WBC 7.5 5.8 6.9 6.8 6.1  NEUTROABS 5.3  --   --   --   --   HGB 11.0* 11.0*  11.0* 10.9* 11.6*  HCT 37.3 37.1 36.8 37.5 38.7  MCV 101.9* 102.8* 102.2* 101.9* 101.3*  PLT 164 153 165 167 155   Basic Metabolic Panel: Recent Labs  Lab 03/09/18 0448 03/10/18 0730 03/11/18 0617 03/12/18 0649 03/13/18 0516  NA 140 140 138 139 138  K 3.4* 3.4* 3.5 3.8 3.8  CL 107 106 104 108 106  CO2 26 28 28 24 26   GLUCOSE 102* 109* 108* 118* 144*  BUN 8 10 14 14 13   CREATININE 1.04* 1.15* 1.03* 0.98 1.02*  CALCIUM 8.1* 8.0* 8.1* 8.3* 8.6*  MG  --  1.8 1.7 1.9 2.0   GFR: Estimated Creatinine Clearance: 74.9 mL/min (Libbey Duce) (by C-G formula based on SCr of 1.02 mg/dL (H)). Liver Function Tests: No results for input(s): AST, ALT, ALKPHOS, BILITOT, PROT, ALBUMIN in the last 168 hours. No results for input(s): LIPASE, AMYLASE in the last 168 hours. No results for input(s): AMMONIA in the last 168 hours. Coagulation Profile: No results for input(s): INR, PROTIME in the last 168 hours. Cardiac Enzymes: No results for input(s):  CKTOTAL, CKMB, CKMBINDEX, TROPONINI in the last 168 hours. BNP (last 3 results) No results for input(s): PROBNP in the last 8760 hours. HbA1C: No results for input(s): HGBA1C in the last 72 hours. CBG: Recent Labs  Lab 03/12/18 1657 03/12/18 2127 03/13/18 0755 03/13/18 1134 03/13/18 1735  GLUCAP 85 129* 112* 134* 114*   Lipid Profile: No results for input(s): CHOL, HDL, LDLCALC, TRIG, CHOLHDL, LDLDIRECT in the last 72 hours. Thyroid Function Tests: No results for input(s): TSH, T4TOTAL, FREET4, T3FREE, THYROIDAB in the last 72 hours. Anemia Panel: No results for input(s): VITAMINB12, FOLATE, FERRITIN, TIBC, IRON, RETICCTPCT in the last 72 hours. Sepsis Labs: No results for input(s): PROCALCITON, LATICACIDVEN in the last 168 hours.  No results found for this or any previous visit (from the past 240 hour(s)).       Radiology Studies: No results found.      Scheduled Meds: . apixaban  5 mg Oral BID  . digoxin  0.125 mg Oral Daily  .  furosemide  40 mg Intravenous BID  . furosemide  40 mg Oral Daily  . insulin aspart  0-15 Units Subcutaneous TID WC  . insulin aspart  0-5 Units Subcutaneous QHS  . losartan  25 mg Oral Daily  . metoprolol succinate  50 mg Oral BID  . potassium chloride  40 mEq Oral Once  . sodium chloride flush  3 mL Intravenous Q12H   Continuous Infusions: . sodium chloride       LOS: 5 days    Time spent: over 30 min    Lacretia Nicks, MD [facility] Pager [phone]  If 7PM-7AM, please contact night-coverage [email or web address] Password [facility] 03/13/2018, 7:18 PM

## 2018-03-13 NOTE — Progress Notes (Signed)
Paged MD on call as pt was agitated and more confused on shift change rounds, pt states " whats going on? Why am I in a firehouse room?" I reminded pt she was at the hospital and very loudly told me she know that and is tired of being lied to" pt said why did not I not get my dinner tray. It was morning and nowi missed dinner. I reminded pt this was her dinner tray and showed her the time on the food sheet, pt looked at it and threw it in the floor " im tired of being lied too" the night shift nurse and myself asked what we could do to help "Just go" pt has been alert and oriented throughout the day. Blood pressure checked and sbp 91 otherwise no abnormality.

## 2018-03-14 LAB — GLUCOSE, CAPILLARY
Glucose-Capillary: 106 mg/dL — ABNORMAL HIGH (ref 70–99)
Glucose-Capillary: 141 mg/dL — ABNORMAL HIGH (ref 70–99)
Glucose-Capillary: 115 mg/dL — ABNORMAL HIGH (ref 70–99)
Glucose-Capillary: 160 mg/dL — ABNORMAL HIGH (ref 70–99)

## 2018-03-14 LAB — CBC
HCT: 40.1 % (ref 36.0–46.0)
Hemoglobin: 11.7 g/dL — ABNORMAL LOW (ref 12.0–15.0)
MCH: 29.7 pg (ref 26.0–34.0)
MCHC: 29.2 g/dL — ABNORMAL LOW (ref 30.0–36.0)
MCV: 101.8 fL — ABNORMAL HIGH (ref 80.0–100.0)
Platelets: 174 10*3/uL (ref 150–400)
RBC: 3.94 MIL/uL (ref 3.87–5.11)
RDW: 15.9 % — ABNORMAL HIGH (ref 11.5–15.5)
WBC: 6.5 10*3/uL (ref 4.0–10.5)
nRBC: 0 % (ref 0.0–0.2)

## 2018-03-14 LAB — BASIC METABOLIC PANEL
Anion gap: 9 (ref 5–15)
BUN: 19 mg/dL (ref 8–23)
CO2: 25 mmol/L (ref 22–32)
Calcium: 8.3 mg/dL — ABNORMAL LOW (ref 8.9–10.3)
Chloride: 105 mmol/L (ref 98–111)
Creatinine, Ser: 1.16 mg/dL — ABNORMAL HIGH (ref 0.44–1.00)
GFR calc Af Amer: 55 mL/min — ABNORMAL LOW (ref >60)
GFR calc non Af Amer: 48 mL/min — ABNORMAL LOW (ref >60)
Glucose, Bld: 203 mg/dL — ABNORMAL HIGH (ref 70–99)
Potassium: 4.3 mmol/L (ref 3.5–5.1)
Sodium: 139 mmol/L (ref 135–145)

## 2018-03-14 LAB — MAGNESIUM: Magnesium: 2 mg/dL (ref 1.7–2.4)

## 2018-03-14 NOTE — Care Management Important Message (Signed)
Important Message  Patient Details  Name: Kaitlyn [name] MRN: [id number] Date of Birth: 07/06/1950   Medicare Important Message Given:  Yes    Oralia Rud Renzo Vincelette 03/14/2018, 4:17 PM

## 2018-03-14 NOTE — Progress Notes (Signed)
Pt refusing bed alarm.

## 2018-03-14 NOTE — Progress Notes (Signed)
NT report pt has wet bed as purewick as  Pt said she repositioned the purewick because it was uncomfortable, pt refused to have bed changed. Pt has been educated on good skin care and preventing further moisture damage, pt still declines

## 2018-03-14 NOTE — Progress Notes (Signed)
Pt c/o her bottom hurting and the pad is hurting her, pad is dry and straight beneath, educated on moisture damage and area will hurt if she scoots and moves due to shearing , stressed importance of avoiding wet pads and letting us change them asap, using moisture barrier to help prevent shearing and chaffing, she said she trys but it is  Hard to get up, discussed importance of keeping skin clean and dry, offered tylenol and declined, pt verbalized understanding

## 2018-03-14 NOTE — Progress Notes (Signed)
Pt has refused to have linen changed during several attempts overnight. Pt educated on need for skin protection and hygiene. Pt still refused to let RN or NT change linen. States she will be changed after breakfast.

## 2018-03-14 NOTE — Progress Notes (Signed)
PROGRESS NOTE    Kaitlyn [name]  ZOX:[id number] DOB: 06-Mar-1951 DOA: 03/08/2018 PCP: Laurey Morale, MD   Brief Narrative:  Kaitlyn [name] is Kaitlyn [name] 67 y.o. female with medical history significant of systolic CHF; DM; and afib presenting with afib with RVR. She has afib and has had trouble breathing the last few days.  Saturday AM, her TV, internet, and phone were disconnected.  She was trying to get help and her neighbor finally called 911 today.  +intermittent heart racing.  She last took medicines about 2 days ago.  +SOB.  No cough.  No fever.  +LE edema.  Increasing mobility issues recently (days to weeks).  She reports medication compliance to me, but her dig level is persistently <0.2 including today and she told cardiology that she doesn't take her Lasix due to difficulty getting to the bathroom.  She also reported during prior hospitalization that she does not have Krystian Younglove car and cannot afford [employer] taxi to appointments and so her only access to care is via EMS/ER.  Based on the fact that she has never before been to an outpatient clinic appointment, the heart failure team has terminated the patient from their practice and she will not be able to be seen by [facility] cardiologists.  She's improved with diuresis.  She's net negative 17 liters since admission.  Will continue diuresis for at least another 24 hours.  Pt may be ready to d/c within 24 - 48 hours.  She's high risk for readmission with noncompliance, but she says she'll try to start using lasix this time at discharge.  Assessment & Plan:   Principal Problem:   Acute on chronic combined systolic and diastolic heart failure (HCC) Active Problems:   H/O noncompliance with medical treatment, presenting hazards to health   Essential hypertension   Diabetes type 2, uncontrolled (HCC)   Seizures (HCC)   Atrial fibrillation with rapid ventricular response (HCC)   Obesity, Class III, BMI 40-49.9 (morbid obesity) (HCC)   Anemia   Acute on chronic  CHF exacerbation -Patient with prior echo in 3/18 showing EF 30-35% with insufficient diastolic evaluation -She was admitted in 7/19 and echo was insufficient to further evaluate but she diuresed significantly -CXR with cardiomegaly and possible mild interstitial edema (vs chronic interstitial lung disease) -repeat echo 10/24 with EF 20-25%, recommended limited study with echo contrast to better evaluate wall motion and exclude LV thrombus, but repeat study today limited.  Discussed with Dr. Rosemary Holms who notes no need to repeat during this hospitalization, follow outpatient.  -Markedly elevated BNP -Afib with RVR on presentation likely contributing to CHF  -Continue arb/beta blocker.  ASA started by admitting provider, but will d/c as pt on eliquis and no hx of MI/CVA that I see documented. - continue IV lasix, making good UOP.  Weight down to 114.8 kg today.   - appreciate Dr. Joaquim Nam, pt dismissed from [facility] practice - she declines coronary angiography on discussion with cards - planning for at least an additional 24 hours of IV diuresis.  She's told Dr. Joaquim Nam she may try lasix once daily at home (she's previously told us that she does not use lasix at home b/c she urinates frequently anyway and she wouldn't be able to manage with lasix).  Wt Readings from Last 3 Encounters:  03/14/18 114.8 kg  12/07/17 122.5 kg  11/29/17 122.5 kg   Elevated Creatinine: follow with continued diuresis, stable (mild bump)   Afib with RVR -Continue metop and digoxin, rate now improved -  Continue eliquis - TSH wnl  DM - A1c 6.4 -hold Glucophage -Cover with moderate-scale SSI  HTN -Continue Cozaar and Toprol XL  Medication noncomplaince - Social worker spoke to pt and pt reported no needs.  Gets groceries online and delivered.  Has 2 neighbors that occasionally assist her.  Planning to carry phone with her in case of emergency.  Social work signed off. - Pt declining life alert, home health,  etc.  We spoke about this at length on 10/28.   - agree with Dr. Rosemary Holms, she'd benefit from home health or placement, but she's refusing this.  Confusion: episode of confusion last night with nurse, she tells me she was dreaming.  Denies SI, hallucinations, delusions.  Appropriate today.  Anemia -Appears to be stable, will follow  Obesity  Seizures -Remote h/o -No longer apparently taking medications for this issue  Hyponatremia: replete, follow  DVT prophylaxis: eliquis Code Status: full  Family Communication: none at bedside Disposition Plan: 24-48 hrs pending cards s/o   Consultants:   cardiology  Procedures:   Echo pending  Antimicrobials:  Anti-infectives (From admission, onward)   None     Subjective: Feeling better. Thinks diuresis is working well.  Objective: Vitals:   03/13/18 2239 03/14/18 0442 03/14/18 0816 03/14/18 0951  BP: 136/84 99/68 114/74 112/71  Pulse: 89 98 90 97  Resp:  18    Temp:  97.9 F (36.6 C)    TempSrc:  Oral    SpO2:  98% 94%   Weight:  114.8 kg    Height:        Intake/Output Summary (Last 24 hours) at 03/14/2018 1952 Last data filed at 03/14/2018 1903 Gross per 24 hour  Intake 960 ml  Output 2650 ml  Net -1690 ml   Filed Weights   03/13/18 0604 03/13/18 0627 03/14/18 0442  Weight: 122.3 kg 115.6 kg 114.8 kg    Examination:  General: No acute distress. Cardiovascular: Heart sounds show Yosgar Demirjian regular rate, and rhythm Lungs: Clear to auscultation bilaterally  Abdomen: Soft, nontender, nondistended  Neurological: Alert and oriented 3. Moves all extremities 4. Cranial nerves II through XII grossly intact. Skin: Warm and dry. No rashes or lesions. Extremities: No clubbing or cyanosis. Bilateral [name]. Psychiatric: Mood and affect are normal. Insight and judgment are appropriate.   Data Reviewed: I have personally reviewed following labs and imaging studies  CBC: Recent Labs  Lab 03/09/18 0448 03/10/18 0730  03/11/18 0617 03/12/18 0649 03/13/18 0516 03/14/18 0417  WBC 7.5 5.8 6.9 6.8 6.1 6.5  NEUTROABS 5.3  --   --   --   --   --   HGB 11.0* 11.0* 11.0* 10.9* 11.6* 11.7*  HCT 37.3 37.1 36.8 37.5 38.7 40.1  MCV 101.9* 102.8* 102.2* 101.9* 101.3* 101.8*  PLT 164 153 165 167 155 174   Basic Metabolic Panel: Recent Labs  Lab 03/10/18 0730 03/11/18 0617 03/12/18 0649 03/13/18 0516 03/14/18 0417  NA 140 138 139 138 139  K 3.4* 3.5 3.8 3.8 4.3  CL 106 104 108 106 105  CO2 28 28 24 26 25   GLUCOSE 109* 108* 118* 144* 203*  BUN 10 14 14 13 19   CREATININE 1.15* 1.03* 0.98 1.02* 1.16*  CALCIUM 8.0* 8.1* 8.3* 8.6* 8.3*  MG 1.8 1.7 1.9 2.0 2.0   GFR: Estimated Creatinine Clearance: 65.7 mL/min (Tahj Njoku) (by C-G formula based on SCr of 1.16 mg/dL (H)). Liver Function Tests: No results for input(s): AST, ALT, ALKPHOS, BILITOT, PROT, ALBUMIN in  the last 168 hours. No results for input(s): LIPASE, AMYLASE in the last 168 hours. No results for input(s): AMMONIA in the last 168 hours. Coagulation Profile: No results for input(s): INR, PROTIME in the last 168 hours. Cardiac Enzymes: No results for input(s): CKTOTAL, CKMB, CKMBINDEX, TROPONINI in the last 168 hours. BNP (last 3 results) No results for input(s): PROBNP in the last 8760 hours. HbA1C: No results for input(s): HGBA1C in the last 72 hours. CBG: Recent Labs  Lab 03/13/18 2104 03/14/18 0729 03/14/18 1135 03/14/18 1642 03/14/18 1921  GLUCAP 121* 106* 141* 115* 160*   Lipid Profile: No results for input(s): CHOL, HDL, LDLCALC, TRIG, CHOLHDL, LDLDIRECT in the last 72 hours. Thyroid Function Tests: No results for input(s): TSH, T4TOTAL, FREET4, T3FREE, THYROIDAB in the last 72 hours. Anemia Panel: No results for input(s): VITAMINB12, FOLATE, FERRITIN, TIBC, IRON, RETICCTPCT in the last 72 hours. Sepsis Labs: No results for input(s): PROCALCITON, LATICACIDVEN in the last 168 hours.  No results found for this or any previous visit  (from the past 240 hour(s)).       Radiology Studies: No results found.      Scheduled Meds: . apixaban  5 mg Oral BID  . digoxin  0.125 mg Oral Daily  . furosemide  40 mg Intravenous BID  . furosemide  40 mg Oral Daily  . insulin aspart  0-15 Units Subcutaneous TID WC  . insulin aspart  0-5 Units Subcutaneous QHS  . losartan  25 mg Oral Daily  . metoprolol succinate  50 mg Oral BID  . potassium chloride  40 mEq Oral Once  . sodium chloride flush  3 mL Intravenous Q12H   Continuous Infusions: . sodium chloride       LOS: 6 days    Time spent: over 30 min    Lacretia Nicks, MD [facility] Pager [phone]  If 7PM-7AM, please contact night-coverage [email or web address] Password [email or web address] 03/14/2018, 7:52 PM

## 2018-03-14 NOTE — Progress Notes (Signed)
Subjective:  Feels better  Net -16 L for hospital stay  Objective:  Vital Signs in the last 24 hours: Temp:  [97.8 F (36.6 C)-97.9 F (36.6 C)] 97.9 F (36.6 C) (10/29 0442) Pulse Rate:  [88-98] 97 (10/29 0951) Resp:  [16-18] 18 (10/29 0442) BP: (91-136)/(63-84) 112/71 (10/29 0951) SpO2:  [94 %-98 %] 94 % (10/29 0816) Weight:  [114.8 kg] 114.8 kg (10/29 0442)  Intake/Output from previous day: 10/28 0701 - 10/29 0700 In: 960 [P.O.:960] Out: 4900 [Urine:4900] Intake/Output from this shift: Total I/O In: 480 [P.O.:480] Out: -   Physical Exam: Nursing note and vitals reviewed. Constitutional: She is oriented to person, place, and time. She appears well-developed and well-nourished.  Morbidly obese  HENT:  Head: Normocephalic and atraumatic.  Neck: Neck supple. JVD improved.  Cardiovascular:  Distal pulses difficult to palpate due to edema  Respiratory: Effort normal and breath sounds normal. She has no wheezes. She has no rales.  GI: Soft. Bowel sounds are normal. There is no tenderness.  Musculoskeletal: She exhibits edema (1+ b/l).  Lymphadenopathy:    She has no cervical adenopathy.  Neurological: She is alert and oriented to person, place, and time. She has normal reflexes.  Skin: Skin is warm and dry. Severe onychomycosis bilateral LE toes. Poor hygiene Psychiatric: She has a normal mood and affect.   Lab Results: Recent Labs    03/13/18 0516 03/14/18 0417  WBC 6.1 6.5  HGB 11.6* 11.7*  PLT 155 174   Recent Labs    03/13/18 0516 03/14/18 0417  NA 138 139  K 3.8 4.3  CL 106 105  CO2 26 25  GLUCOSE 144* 203*  BUN 13 19  CREATININE 1.02* 1.16*   Cardiac studies: EKG 03/09/2018: Sinus rhythm. Nonspecific ST-T changes. Borderline prolonged QTc interval  Echocardiogram 03/10/2018: Study Conclusions  - Impressions: Limited images with definity of Lv. Consdier   repeating when patient in less pain   LV is moderately dilated with diffuse appearing  hypokinesis EF   25-30%  Impressions:  - Limited images with definity of Lv. Consdier repeating when   patient in less pain   LV is moderately dilated with diffuse appearing hypokinesis EF   25-30%  I personally reviewed echocardiogram study from 03/09/2018 and 03/10/2018 with Definity contrast. Today's study adequate today and rules out any LV thrombus.  Hospital echocardiogram 11/24/2017:  - Left ventricle: Poot acoustic windows and ectopy make evaluation of LVEF difficult LVEFF appearss mildly depressed with septal and inferior hypokinesis. WOuld recomm limited echo with Definity to further define. The cavity size was mildly dilated. Wall thickness was increased in a pattern of mild LVH. - Aortic valve: There was mild regurgitation. - Mitral valve: Calcified annulus. Mildly thickened leaflets . There was mild regurgitation. - Left atrium: The atrium was moderately dilated. - Right ventricle: The cavity size was mildly dilated. - Right atrium: The atrium was mildly dilated. - Pericardium, extracardiac: A trivial pericardial effusion was identified.  Hospital echocardiogram 07/26/2016: Study Conclusions  - Left ventricle: The cavity size was normal. Wall thickness was increased in a pattern of mild LVH. Systolic function was moderately to severely reduced. The estimated ejection fraction was in the range of 30% to 35%. Wall motion was normal; there were no regional wall motion abnormalities. The study is not technically sufficient to allow evaluation of LV diastolic function. - Mitral valve: Calcified annulus. - Left atrium: The atrium was severely dilated. Volume/bsa, ES (1-plane Simpson&'s, A4C): 74.4 ml/m^2. - Right atrium:  The atrium was mildly dilated.  Impressions:  - EF is mildly improved when compared to prior study (20-25%)   Assessment/Recommendations:  67 y/o female with long standing cardiomyopathy of uncertain etiology,  EF 30-35%, persistent atrial fibrillation, morbid obesity, hypertension, type 2 DM, h/o seizures, h/o medication and diet noncompliance, admitted with heart failure exacerbation.  Acute on chronic systolic heart failure: Excellent diuresis with IV lasix 40 mg bid. Add lasix 40 mg PO daily with plans for discharge 10/30. Patient has agreed to take at least once a day PO lasix at home. With her noncompliance, she remains a high risk for hospital readmission. Recommend strict intake and output and daily weights.  Continue losartan 25 mg daily, metoprol succinate 50 mg daily, digoxin 0.125 mg daily.  Patient is not interested in performing coronary angiography for definitive evaluation of cardiamyopathy etiology. I had detailed conversation with patient regarding right and lifestyle as well as medication compliance.  Patient has very little insight in her dietary restrictions. I counseled the patien regarding the same, but her compliance remains questionable.   Persistent Afib: CHA2DS2VAsc score 5, annual stroke risk 7.2% Rate currently controlled.  Continue metoprolol and digoxin. Continue Eliquis.  H/o noncompliance: Remains primary limiting factor. This emanates from patient's financial constrains and lack of social support. I think she may benefit from nursing home placement, but patient is not willing to consider this. At the very least, consider home health.   Hypertension:  Controlled. Continue current therapy.  Type 2 DM: Management per the primary team.   LOS: 6 days    Bryse Blanchette J Manan Olmo 03/14/2018, 2:13 PM  Aquarius Tremper Emiliano Dyer, MD [facility]. [place] Pager: [phone] Office: [phone] If no answer Cell [phone]

## 2018-03-15 LAB — GLUCOSE, CAPILLARY
Glucose-Capillary: 106 mg/dL — ABNORMAL HIGH (ref 70–99)
Glucose-Capillary: 139 mg/dL — ABNORMAL HIGH (ref 70–99)

## 2018-03-15 LAB — BASIC METABOLIC PANEL
Anion gap: 4 — ABNORMAL LOW (ref 5–15)
BUN: 20 mg/dL (ref 8–23)
CO2: 31 mmol/L (ref 22–32)
Calcium: 8.7 mg/dL — ABNORMAL LOW (ref 8.9–10.3)
Chloride: 103 mmol/L (ref 98–111)
Creatinine, Ser: 1.08 mg/dL — ABNORMAL HIGH (ref 0.44–1.00)
GFR calc Af Amer: >60 mL/min (ref >60)
GFR calc non Af Amer: 52 mL/min — ABNORMAL LOW (ref >60)
Glucose, Bld: 117 mg/dL — ABNORMAL HIGH (ref 70–99)
Potassium: 3.8 mmol/L (ref 3.5–5.1)
Sodium: 138 mmol/L (ref 135–145)

## 2018-03-15 LAB — CBC
HCT: 39.2 % (ref 36.0–46.0)
Hemoglobin: 11.7 g/dL — ABNORMAL LOW (ref 12.0–15.0)
MCH: 30.2 pg (ref 26.0–34.0)
MCHC: 29.8 g/dL — ABNORMAL LOW (ref 30.0–36.0)
MCV: 101 fL — ABNORMAL HIGH (ref 80.0–100.0)
Platelets: 162 10*3/uL (ref 150–400)
RBC: 3.88 MIL/uL (ref 3.87–5.11)
RDW: 15.9 % — ABNORMAL HIGH (ref 11.5–15.5)
WBC: 6.1 10*3/uL (ref 4.0–10.5)
nRBC: 0 % (ref 0.0–0.2)

## 2018-03-15 LAB — MAGNESIUM: Magnesium: 2.1 mg/dL (ref 1.7–2.4)

## 2018-03-15 MED ORDER — APIXABAN 5 MG PO TABS: 5.0000 mg | ORAL_TABLET | Freq: Two times a day (BID) | ORAL | 0 refills | Status: DC

## 2018-03-15 MED ORDER — FUROSEMIDE 40 MG PO TABS: 40.0000 mg | ORAL_TABLET | Freq: Every day | ORAL | 1 refills | Status: DC

## 2018-03-15 MED ORDER — METOPROLOL SUCCINATE ER 50 MG PO TB24: 50.0000 mg | ORAL_TABLET | Freq: Two times a day (BID) | ORAL | 0 refills | Status: DC

## 2018-03-15 MED ORDER — VITAMIN B-12 1000 MCG PO TABS: 1000.0000 ug | ORAL_TABLET | Freq: Every day | ORAL | 3 refills | Status: DC

## 2018-03-15 MED ORDER — ACETAMINOPHEN 325 MG PO TABS: 650.0000 mg | ORAL_TABLET | ORAL | 0 refills | Status: DC | PRN

## 2018-03-15 MED ORDER — LOSARTAN POTASSIUM 25 MG PO TABS: 25.0000 mg | ORAL_TABLET | Freq: Every day | ORAL | 0 refills | Status: DC

## 2018-03-15 MED ORDER — POTASSIUM CHLORIDE 20 MEQ PO PACK: 20.0000 meq | PACK | Freq: Every day | ORAL | 0 refills | Status: DC

## 2018-03-15 MED ORDER — DIGOXIN 125 MCG PO TABS: 0.1250 mg | ORAL_TABLET | Freq: Every day | ORAL | 0 refills | Status: DC

## 2018-03-15 NOTE — Plan of Care (Signed)

## 2018-03-15 NOTE — Progress Notes (Signed)
Subjective:  Feels better  Net -17.7 L for hospital stay  Objective:  Vital Signs in the last 24 hours: Temp:  [97.3 F (36.3 C)-97.6 F (36.4 C)] 97.3 F (36.3 C) (10/30 0601) Pulse Rate:  [89-97] 89 (10/30 0601) Resp:  [18] 18 (10/30 0601) BP: (109-112)/(58-74) 110/74 (10/30 0601) SpO2:  [95 %-96 %] 95 % (10/30 0601) Weight:  [114.5 kg] 114.5 kg (10/30 0601)  Intake/Output from previous day: 10/29 0701 - 10/30 0700 In: 1200 [P.O.:1200] Out: 2150 [Urine:2150] Intake/Output from this shift: No intake/output data recorded.  Physical Exam: Nursing note and vitals reviewed. Constitutional: She is oriented to person, place, and time. She appears well-developed and well-nourished.  Morbidly obese  HENT:  Head: Normocephalic and atraumatic.  Neck: Neck supple. No JVD  Cardiovascular:  Distal pulses difficult to palpate due to edema  Respiratory: Effort normal and breath sounds normal. She has no wheezes. She has no rales.  GI: Soft. Bowel sounds are normal. There is no tenderness.  Musculoskeletal: She exhibits edema (1+ b/l).  Lymphadenopathy:    She has no cervical adenopathy.  Neurological: She is alert and oriented to person, place, and time. She has normal reflexes.  Skin: Skin is warm and dry. Severe onychomycosis bilateral LE toes. Poor hygiene Psychiatric: She has a normal mood and affect.   Lab Results: Recent Labs    03/14/18 0417 03/15/18 0505  WBC 6.5 6.1  HGB 11.7* 11.7*  PLT 174 162   Recent Labs    03/14/18 0417 03/15/18 0505  NA 139 138  K 4.3 3.8  CL 105 103  CO2 25 31  GLUCOSE 203* 117*  BUN 19 20  CREATININE 1.16* 1.08*   Cardiac studies: EKG 03/09/2018: Sinus rhythm. Nonspecific ST-T changes. Borderline prolonged QTc interval  Echocardiogram 03/10/2018: Study Conclusions  - Impressions: Limited images with definity of Lv. Consdier   repeating when patient in less pain   LV is moderately dilated with diffuse appearing hypokinesis  EF   25-30%  Impressions:  - Limited images with definity of Lv. Consdier repeating when   patient in less pain   LV is moderately dilated with diffuse appearing hypokinesis EF   25-30%  I personally reviewed echocardiogram study from 03/09/2018 and 03/10/2018 with Definity contrast. Today's study adequate today and rules out any LV thrombus.  Hospital echocardiogram 11/24/2017:  - Left ventricle: Poot acoustic windows and ectopy make evaluation of LVEF difficult LVEFF appearss mildly depressed with septal and inferior hypokinesis. WOuld recomm limited echo with Definity to further define. The cavity size was mildly dilated. Wall thickness was increased in a pattern of mild LVH. - Aortic valve: There was mild regurgitation. - Mitral valve: Calcified annulus. Mildly thickened leaflets . There was mild regurgitation. - Left atrium: The atrium was moderately dilated. - Right ventricle: The cavity size was mildly dilated. - Right atrium: The atrium was mildly dilated. - Pericardium, extracardiac: A trivial pericardial effusion was identified.  Hospital echocardiogram 07/26/2016: Study Conclusions  - Left ventricle: The cavity size was normal. Wall thickness was increased in a pattern of mild LVH. Systolic function was moderately to severely reduced. The estimated ejection fraction was in the range of 30% to 35%. Wall motion was normal; there were no regional wall motion abnormalities. The study is not technically sufficient to allow evaluation of LV diastolic function. - Mitral valve: Calcified annulus. - Left atrium: The atrium was severely dilated. Volume/bsa, ES (1-plane Simpson&'s, A4C): 74.4 ml/m^2. - Right atrium: The atrium was mildly  dilated.  Impressions:  - EF is mildly improved when compared to prior study (20-25%)   Assessment/Recommendations:  67 y/o female with long standing cardiomyopathy of uncertain etiology, EF 30-35%,  persistent atrial fibrillation, morbid obesity, hypertension, type 2 DM, h/o seizures, h/o medication and diet noncompliance, admitted with heart failure exacerbation.  Acute on chronic systolic heart failure: Continue lasix 40 mg PO daily. Patient has agreed to take at least once a day PO lasix at home. With her noncompliance, she remains a high risk for hospital readmission.  Recommend strict intake and output and daily weights.  Continue losartan 25 mg daily, metoprol succinate 50 mg daily, digoxin 0.125 mg daily.  Patient is not interested in performing coronary angiography for definitive evaluation of cardiamyopathy etiology. I have had detailed conversation with patient regarding right and lifestyle as well as medication compliance.  Patient has very little insight in her dietary restrictions. I counseled the patien regarding the same, but her compliance remains questionable.   Persistent Afib: CHA2DS2VAsc score 5, annual stroke risk 7.2% Rate currently controlled.  Continue metoprolol and digoxin. Continue Eliquis.  H/o noncompliance: Remains primary limiting factor. This emanates from patient's financial constrains and lack of social support. I think she may benefit from nursing home placement, but patient is not willing to consider this. At the very least, consider home health.   Hypertension:  Controlled. Continue current therapy.  Type 2 DM: Management per the primary team.  With patient's overall poor functional capacity, poor hygiene, and inability to care for her complex medical issues, I think she will be better served in a nursing home. She has not been much ambulatory during her hospital stay and will need OT/PT evaluation for discharge. Unfortunately, she is very adamant that she does not want to go to nursing home. She has high risk of recurrent hospital admission due to poor social support, noncompliance, and lack of insight into her complex medical issues.    LOS: 7  days    Teyana Pierron J Selah Klang 03/15/2018, 9:07 AM  Makaveli Hoard Emiliano Dyer, MD [facility]. [place] Pager: [phone] Office: [phone] If no answer Cell [phone]

## 2018-03-15 NOTE — Discharge Summary (Signed)
[facility]  Physician Discharge Summary   Patient ID: Kaitlyn [name] MRN: [id number] DOB/AGE: November 06, 1950 67 y.o.  Admit date: 03/08/2018 Discharge date: 03/15/2018  PCP: Laurey Morale, MD  DISCHARGE DIAGNOSES:  Acute on chronic systolic CHF Medical noncompliance Chronic atrial fibrillation Diabetes mellitus type 2 Essential hypertension   RECOMMENDATIONS FOR OUTPATIENT FOLLOW UP: 1. Outpatient follow-up with cardiology and primary care provider 2. Patient declines home health   DISCHARGE CONDITION: fair  Diet recommendation: Modified carbohydrate  Filed Weights   03/13/18 0627 03/14/18 0442 03/15/18 0601  Weight: 115.6 kg 114.8 kg 114.5 kg    INITIAL HISTORY: Kaitlyn [name] a 67 y.o.femalewith medical history significant ofsystolic CHF; DM; and afib presented with afib with RVR.  She was also found to have acute systolic CHF.[name] She also reported during prior hospitalization that she does not have a car and cannot afford a taxi to appointments and so her only access to care is via EMS/ER. Based on the fact that she has never before been to an outpatient clinic appointment, the heart failure team has terminated the patient from their practice and she will not be able to be seen by [facility] cardiologists.  Consultations:  DR. Rosemary Holms with cardiology    HOSPITAL COURSE:   Acute on chronic CHF exacerbation -Patient with prior echo in 3/18 showing EF 30-35% with insufficient diastolic evaluation -She was admitted in 7/19 and echo was insufficient to further evaluate but she diuresed significantly -CXR with cardiomegaly and possible mild interstitial edema (vs chronic interstitial lung disease) -repeat echo 10/24 with EF 20-25%, recommended limited study with echo contrast to better evaluate wall motion and exclude LV thrombus, but repeat study today limited.  Discussed with Dr. Rosemary Holms who notes no need to repeat during this hospitalization, follow  outpatient.  -Afib with RVR on presentation likely contributing to CHF -Continue arb/beta blocker.   - Appreciate Dr. Joaquim Nam, pt dismissed from [facility] practice - she declines coronary angiography on discussion with cards  Seems to be back to baseline.  May resume oral Lasix at home.  Outpatient follow-up with cardiology.  Elevated Creatinine Stable with diuresis.  Afib with RVR Rate well controlled on metoprolol and digoxin.  TSH was normal.  Anticoagulation with Eliquis.  Diabetes mellitus type II - A1c 6.4.  Continue home medications.  Essential hypertension  Continue home medications  Medication noncomplaince - Pt declining life alert, home health, etc.  We spoke about this at length on 10/28.   - agree with Dr. Rosemary Holms, she'd benefit from home health or placement, but she's refusing this.  She refused to work with physical therapy.  Has been able to get around the room.  Confusion Transient episode of confusion.  Seems to be appropriate today.    Microcytic anemia/vitamin B12 deficiency -Appears to be stable, will follow.  Macrocytosis noted.  TSH was normal.  B12 was noted to be 103 in July.  Will need supplementation.  Obesity Body mass index is 35.21 kg/m.  Seizures -Remote h/o -No longer apparently taking medications for this issue   Overall stable.  Okay for discharge home today.  Cleared by cardiology.  Patient declines placement and home health.  Refuses to work with physical therapy.    PERTINENT LABS:  The results of significant diagnostics from this hospitalization (including imaging, microbiology, ancillary and laboratory) are listed below for reference.     Labs: Basic Metabolic Panel: Recent Labs  Lab 03/11/18 0617 03/12/18 4098 03/13/18 0516 03/14/18 0417 03/15/18 0505  NA 138  139 138 139 138  K 3.5 3.8 3.8 4.3 3.8  CL 104 108 106 105 103  CO2 28 24 26 25 31   GLUCOSE 108* 118* 144* 203* 117*  BUN 14 14 13 19 20   CREATININE  1.03* 0.98 1.02* 1.16* 1.08*  CALCIUM 8.1* 8.3* 8.6* 8.3* 8.7*  MG 1.7 1.9 2.0 2.0 2.1   CBC: Recent Labs  Lab 03/09/18 0448  03/11/18 0617 03/12/18 0649 03/13/18 0516 03/14/18 0417 03/15/18 0505  WBC 7.5   < > 6.9 6.8 6.1 6.5 6.1  NEUTROABS 5.3  --   --   --   --   --   --   HGB 11.0*   < > 11.0* 10.9* 11.6* 11.7* 11.7*  HCT 37.3   < > 36.8 37.5 38.7 40.1 39.2  MCV 101.9*   < > 102.2* 101.9* 101.3* 101.8* 101.0*  PLT 164   < > 165 167 155 174 162   < > = values in this interval not displayed.   BNP: BNP (last 3 results) Recent Labs    11/22/17 2317 12/07/17 1955 03/08/18 1235  BNP 725.4* 486.8* 1,146.6*    CBG: Recent Labs  Lab 03/14/18 1135 03/14/18 1642 03/14/18 1921 03/15/18 0735 03/15/18 1143  GLUCAP 141* 115* 160* 106* 139*     IMAGING STUDIES Dg Chest 2 View  Result Date: 03/08/2018 CLINICAL DATA:  Shortness of breath.  Weakness. EXAM: CHEST - 2 VIEW COMPARISON:  11/22/2017. FINDINGS: Cardiomegaly with normal pulmonary vascularity. Diffuse bilateral mild interstitial prominence consistent chronic interstitial lung disease again noted. No interim change. Tiny left pleural effusion. No pneumothorax. IMPRESSION: 1.  Stable cardiomegaly.  No pulmonary venous congestion. 2. Stable bilateral interstitial prominence most consistent chronic interstitial lung disease. An active interstitial process including mild interstitial edema cannot be entirely excluded. Tiny left pleural effusion. Electronically Signed   By: Maisie Fus  Register   On: 03/08/2018 13:15    DISCHARGE EXAMINATION: Vitals:   03/14/18 0951 03/14/18 2040 03/15/18 0601 03/15/18 1200  BP: 112/71 (!) 109/58 110/74 126/82  Pulse: 97 95 89 100  Resp:  18 18 18   Temp:  97.6 F (36.4 C) (!) 97.3 F (36.3 C) 97.6 F (36.4 C)  TempSrc:  Oral Oral Oral  SpO2:  96% 95% 97%  Weight:   114.5 kg   Height:       General appearance: alert, cooperative, appears stated age and no distress Resp: clear to  auscultation bilaterally Cardio: regular rate and rhythm, S1, S2 normal, no murmur, click, rub or gallop GI: soft, non-tender; bowel sounds normal; no masses,  no organomegaly Extremities: extremities normal, atraumatic, no cyanosis or edema  DISPOSITION: Home  Discharge Instructions    (HEART FAILURE PATIENTS) Call MD:  Anytime you have any of the following symptoms: 1) 3 pound weight gain in 24 hours or 5 pounds in 1 week 2) shortness of breath, with or without a dry hacking cough 3) swelling in the hands, feet or stomach 4) if you have to sleep on extra pillows at night in order to breathe.   Complete by:  As directed    Call MD for:  extreme fatigue   Complete by:  As directed    Call MD for:  persistant dizziness or light-headedness   Complete by:  As directed    Call MD for:  persistant nausea and vomiting   Complete by:  As directed    Call MD for:  severe uncontrolled pain   Complete by:  As directed    Call MD for:  temperature >100.4   Complete by:  As directed    Diet - low sodium heart healthy   Complete by:  As directed    Discharge instructions   Complete by:  As directed    Please take your medications as prescribed.  Please watch your fluid intake and restrict to 1500 mL/day.  Check your weight on a daily basis.  Keep your follow-up appointments.  You were cared for by a hospitalist during your hospital stay. If you have any questions about your discharge medications or the care you received while you were in the hospital after you are discharged, you can call the unit and asked to speak with the hospitalist on call if the hospitalist that took care of you is not available. Once you are discharged, your primary care physician will handle any further medical issues. Please note that NO REFILLS for any discharge medications will be authorized once you are discharged, as it is imperative that you return to your primary care physician (or establish a relationship with a primary  care physician if you do not have one) for your aftercare needs so that they can reassess your need for medications and monitor your lab values. If you do not have a primary care physician, you can call [phone] for a physician referral.   Increase activity slowly   Complete by:  As directed          Allergies as of 03/15/2018      Reactions   Caffeine Other (See Comments)   Seizure from large doses, heart races from small doses   Cranberry Shortness Of Breath, Diarrhea, Itching, Other (See Comments)   Severe headache.."Ocean Spray cranberry juice"   Lisinopril Diarrhea, Itching, Swelling   Penicillins Other (See Comments)   Unknown childhood allergy Has patient had a PCN reaction causing immediate rash, facial/tongue/throat swelling, SOB or lightheadedness with hypotension: unknown Has patient had a PCN reaction causing severe rash involving mucus membranes or skin necrosis: unknown Has patient had a PCN reaction that required hospitalization unknown Has patient had a PCN reaction occurring within the last 10 years: unknown If all of the above answers are "NO", then may proceed with Cephalosporin use.   Other Other (See Comments)   Stimulants Pt has a past hx of seizures      Medication List    STOP taking these medications   Potassium Chloride ER 20 MEQ Tbcr Replaced by:  potassium chloride 20 MEQ packet     TAKE these medications   acetaminophen 325 MG tablet Commonly known as:  TYLENOL Take 2 tablets (650 mg total) by mouth every 4 (four) hours as needed for headache or mild pain.   apixaban 5 MG Tabs tablet Commonly known as:  ELIQUIS Take 1 tablet (5 mg total) by mouth 2 (two) times daily.   digoxin 0.125 MG tablet Commonly known as:  LANOXIN Take 1 tablet (0.125 mg total) by mouth daily.   furosemide 40 MG tablet Commonly known as:  LASIX Take 1 tablet (40 mg total) by mouth daily.   loperamide HCl 1 MG/7.5ML suspension Commonly known as:  IMODIUM Take 15  mLs (2 mg total) by mouth as needed for diarrhea or loose stools (2mg  after weach loose stool up to 16mg /day).   losartan 25 MG tablet Commonly known as:  COZAAR Take 1 tablet (25 mg total) by mouth daily.   metFORMIN 500 MG tablet Commonly known as:  GLUCOPHAGE  Take 1 tablet (500 mg total) by mouth 2 (two) times daily with a meal.   metoprolol succinate 50 MG 24 hr tablet Commonly known as:  TOPROL-XL Take 1 tablet (50 mg total) by mouth 2 (two) times daily. Take with or immediately following a meal. What changed:    medication strength  how much to take  additional instructions   potassium chloride 20 MEQ packet Commonly known as:  KLOR-CON Take 20 mEq by mouth daily. Replaces:  Potassium Chloride ER 20 MEQ Tbcr   vitamin B-12 1000 MCG tablet Commonly known as:  CYANOCOBALAMIN Take 1 tablet (1,000 mcg total) by mouth daily.        Follow-up Information    Elder Negus, MD On 03/22/2018.   Specialty:  Cardiology Why:  3:00 PM Contact information: [address] [place] [address] [phone]           TOTAL DISCHARGE TIME: 35 mins  Osvaldo Shipper  [facility] Pager [phone]  03/15/2018, 2:53 PM

## 2018-03-15 NOTE — Progress Notes (Signed)
Discussed patient discharge information- including medication and follow up appointment.   Patient was upset about the date.  Tried to explain importance of following up with MD within 7-10 days.   Pt said she would not be able to make it due to transportation issues.  Strongly encourage pt to go.

## 2018-03-15 NOTE — Progress Notes (Signed)
Patient is for discharge home today; she is requesting non emergent ambulance [facility]; home address verified. Paperwork completed for ambulance transportation. Kaitlyn [name] [facility] [phone]

## 2018-04-25 ENCOUNTER — Other Ambulatory Visit: Payer: Self-pay

## 2018-04-25 ENCOUNTER — Emergency Department (HOSPITAL_COMMUNITY)
Admission: EM | Admit: 2018-04-25 | Discharge: 2018-04-26 | Disposition: A | Discharge status: Home / Self Care | Payer: Medicare Other | Attending: Emergency Medicine | Admitting: Emergency Medicine

## 2018-04-25 DIAGNOSIS — Z7984 Long term (current) use of oral hypoglycemic drugs: Secondary | ICD-10-CM | POA: Insufficient documentation

## 2018-04-25 DIAGNOSIS — Z7901 Long term (current) use of anticoagulants: Secondary | ICD-10-CM | POA: Diagnosis not present

## 2018-04-25 DIAGNOSIS — I110 Hypertensive heart disease with heart failure: Secondary | ICD-10-CM | POA: Insufficient documentation

## 2018-04-25 DIAGNOSIS — I252 Old myocardial infarction: Secondary | ICD-10-CM | POA: Insufficient documentation

## 2018-04-25 DIAGNOSIS — I5043 Acute on chronic combined systolic (congestive) and diastolic (congestive) heart failure: Secondary | ICD-10-CM | POA: Insufficient documentation

## 2018-04-25 DIAGNOSIS — E119 Type 2 diabetes mellitus without complications: Secondary | ICD-10-CM | POA: Diagnosis not present

## 2018-04-25 DIAGNOSIS — R197 Diarrhea, unspecified: Secondary | ICD-10-CM | POA: Insufficient documentation

## 2018-04-25 NOTE — ED Triage Notes (Addendum)
Pt to ED via [facility] with c/o diarrhea x's 4 days.  Pt denies nausea or vomiting.  EMS reports pt has not been out of her recliner x's 4 days.,
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
## 2018-04-26 LAB — CBC WITH DIFFERENTIAL/PLATELET
Abs Immature Granulocytes: 0.02 10*3/uL (ref 0.00–0.07)
Basophils Absolute: 0.1 10*3/uL (ref 0.0–0.1)
Basophils Relative: 1 %
Eosinophils Absolute: 0.2 10*3/uL (ref 0.0–0.5)
Eosinophils Relative: 3 %
HCT: 37.6 % (ref 36.0–46.0)
Hemoglobin: 11.3 g/dL — ABNORMAL LOW (ref 12.0–15.0)
Immature Granulocytes: 0 %
Lymphocytes Relative: 13 %
Lymphs Abs: 1 10*3/uL (ref 0.7–4.0)
MCH: 30.8 pg (ref 26.0–34.0)
MCHC: 30.1 g/dL (ref 30.0–36.0)
MCV: 102.5 fL — ABNORMAL HIGH (ref 80.0–100.0)
Monocytes Absolute: 0.5 10*3/uL (ref 0.1–1.0)
Monocytes Relative: 6 %
Neutro Abs: 6 10*3/uL (ref 1.7–7.7)
Neutrophils Relative %: 77 %
Platelets: 203 10*3/uL (ref 150–400)
RBC: 3.67 MIL/uL — ABNORMAL LOW (ref 3.87–5.11)
RDW: 18.3 % — ABNORMAL HIGH (ref 11.5–15.5)
WBC: 7.9 10*3/uL (ref 4.0–10.5)
nRBC: 0 % (ref 0.0–0.2)

## 2018-04-26 LAB — COMPREHENSIVE METABOLIC PANEL
ALT: 11 U/L (ref 0–44)
AST: 14 U/L — ABNORMAL LOW (ref 15–41)
Albumin: 3.5 g/dL (ref 3.5–5.0)
Alkaline Phosphatase: 73 U/L (ref 38–126)
Anion gap: 11 (ref 5–15)
BUN: 8 mg/dL (ref 8–23)
CO2: 19 mmol/L — ABNORMAL LOW (ref 22–32)
Calcium: 8.5 mg/dL — ABNORMAL LOW (ref 8.9–10.3)
Chloride: 109 mmol/L (ref 98–111)
Creatinine, Ser: 0.99 mg/dL (ref 0.44–1.00)
GFR calc Af Amer: >60 mL/min (ref >60)
GFR calc non Af Amer: 59 mL/min — ABNORMAL LOW (ref >60)
Glucose, Bld: 152 mg/dL — ABNORMAL HIGH (ref 70–99)
Potassium: 4 mmol/L (ref 3.5–5.1)
Sodium: 139 mmol/L (ref 135–145)
Total Bilirubin: 1.3 mg/dL — ABNORMAL HIGH (ref 0.3–1.2)
Total Protein: 6.7 g/dL (ref 6.5–8.1)

## 2018-04-26 LAB — MAGNESIUM: Magnesium: 1.8 mg/dL (ref 1.7–2.4)

## 2018-04-26 MED ORDER — SODIUM CHLORIDE 0.9 % IV BOLUS
1000.0000 mL | Freq: Once | INTRAVENOUS | Status: AC
  Administered 2018-04-26: 1000 mL via INTRAVENOUS

## 2018-04-26 MED ORDER — LOPERAMIDE HCL 2 MG PO CAPS
2.0000 mg | ORAL_CAPSULE | Freq: Once | ORAL | Status: AC
  Administered 2018-04-26: 2 mg via ORAL
  Filled 2018-04-26: qty 1

## 2018-04-26 NOTE — ED Notes (Signed)
[facility] CALLED  °

## 2018-04-26 NOTE — Discharge Instructions (Addendum)
Thank you for allowing me to care for you today in the Emergency Department.   You should avoid taking loperamide/Imodium.  This can cause changes to the rhythm of your heart can be dangerous with continued use.  Try to keep a diary of the foods that you eat that cause diarrhea.  Please follow-up with gastroenterology.  Return to the emergency department if you develop significantly worsening symptoms including significant swelling in your legs or belly, shortness of breath, if you pass out, have a high fever, have more than 7 episodes of diarrhea in a day, if you develop dark black or bright red stool, vomiting, or other new, concerning symptoms.

## 2018-04-26 NOTE — ED Notes (Signed)
[facility] called to transport patient home 

## 2018-06-02 ENCOUNTER — Other Ambulatory Visit: Payer: Self-pay

## 2018-06-02 ENCOUNTER — Emergency Department (HOSPITAL_COMMUNITY)
Admission: EM | Admit: 2018-06-02 | Discharge: 2018-06-03 | Disposition: A | Discharge status: Home / Self Care | Payer: Medicare Other | Attending: Emergency Medicine | Admitting: Emergency Medicine

## 2018-06-02 DIAGNOSIS — I110 Hypertensive heart disease with heart failure: Secondary | ICD-10-CM | POA: Diagnosis not present

## 2018-06-02 DIAGNOSIS — I252 Old myocardial infarction: Secondary | ICD-10-CM | POA: Diagnosis not present

## 2018-06-02 DIAGNOSIS — I4819 Other persistent atrial fibrillation: Secondary | ICD-10-CM | POA: Diagnosis not present

## 2018-06-02 DIAGNOSIS — Z79899 Other long term (current) drug therapy: Secondary | ICD-10-CM | POA: Diagnosis not present

## 2018-06-02 DIAGNOSIS — R197 Diarrhea, unspecified: Secondary | ICD-10-CM | POA: Diagnosis present

## 2018-06-02 DIAGNOSIS — E119 Type 2 diabetes mellitus without complications: Secondary | ICD-10-CM | POA: Insufficient documentation

## 2018-06-02 DIAGNOSIS — Z7901 Long term (current) use of anticoagulants: Secondary | ICD-10-CM | POA: Insufficient documentation

## 2018-06-02 DIAGNOSIS — I5022 Chronic systolic (congestive) heart failure: Secondary | ICD-10-CM | POA: Diagnosis not present

## 2018-06-02 LAB — COMPREHENSIVE METABOLIC PANEL
ALT: 14 U/L (ref 0–44)
AST: 17 U/L (ref 15–41)
Albumin: 3.4 g/dL — ABNORMAL LOW (ref 3.5–5.0)
Alkaline Phosphatase: 64 U/L (ref 38–126)
Anion gap: 10 (ref 5–15)
BUN: 16 mg/dL (ref 8–23)
CO2: 19 mmol/L — ABNORMAL LOW (ref 22–32)
Calcium: 8.3 mg/dL — ABNORMAL LOW (ref 8.9–10.3)
Chloride: 112 mmol/L — ABNORMAL HIGH (ref 98–111)
Creatinine, Ser: 1.02 mg/dL — ABNORMAL HIGH (ref 0.44–1.00)
GFR calc Af Amer: >60 mL/min (ref >60)
GFR calc non Af Amer: 56 mL/min — ABNORMAL LOW (ref >60)
Glucose, Bld: 110 mg/dL — ABNORMAL HIGH (ref 70–99)
Potassium: 3.9 mmol/L (ref 3.5–5.1)
Sodium: 141 mmol/L (ref 135–145)
Total Bilirubin: 0.8 mg/dL (ref 0.3–1.2)
Total Protein: 6.2 g/dL — ABNORMAL LOW (ref 6.5–8.1)

## 2018-06-02 LAB — CBC WITH DIFFERENTIAL/PLATELET
Abs Immature Granulocytes: 0.02 10*3/uL (ref 0.00–0.07)
Basophils Absolute: 0.1 10*3/uL (ref 0.0–0.1)
Basophils Relative: 1 %
Eosinophils Absolute: 0.2 10*3/uL (ref 0.0–0.5)
Eosinophils Relative: 3 %
HCT: 38.1 % (ref 36.0–46.0)
Hemoglobin: 11.4 g/dL — ABNORMAL LOW (ref 12.0–15.0)
Immature Granulocytes: 0 %
Lymphocytes Relative: 18 %
Lymphs Abs: 1.3 10*3/uL (ref 0.7–4.0)
MCH: 32.2 pg (ref 26.0–34.0)
MCHC: 29.9 g/dL — ABNORMAL LOW (ref 30.0–36.0)
MCV: 107.6 fL — ABNORMAL HIGH (ref 80.0–100.0)
Monocytes Absolute: 0.5 10*3/uL (ref 0.1–1.0)
Monocytes Relative: 7 %
Neutro Abs: 5 10*3/uL (ref 1.7–7.7)
Neutrophils Relative %: 71 %
Platelets: 160 10*3/uL (ref 150–400)
RBC: 3.54 MIL/uL — ABNORMAL LOW (ref 3.87–5.11)
RDW: 17.5 % — ABNORMAL HIGH (ref 11.5–15.5)
WBC: 7 10*3/uL (ref 4.0–10.5)
nRBC: 0 % (ref 0.0–0.2)

## 2018-06-02 LAB — DIGOXIN LEVEL: Digoxin Level: <0.2 ng/mL — ABNORMAL LOW (ref 0.8–2.0)
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
## 2018-06-02 NOTE — ED Triage Notes (Signed)
Pt c/o diarrhea x 1 month w/o PCP care.  Pt soiled herself and was not able to clean herself.  EMS Reports Failure to thrive.  Pt stated to EMS she was not able to shower.

## 2018-06-03 MED ORDER — LOPERAMIDE HCL 1 MG/7.5ML PO SUSP
2.0000 mg | Freq: Once | ORAL | Status: AC
  Administered 2018-06-03: 2 mg via ORAL
  Filled 2018-06-03: qty 15

## 2018-06-03 MED ORDER — LOPERAMIDE HCL 1 MG/7.5ML PO SUSP: 2.0000 mg | ORAL | 0 refills | Status: DC | PRN

## 2018-06-03 NOTE — ED Notes (Signed)
Called [facility] 

## 2018-07-11 ENCOUNTER — Emergency Department (HOSPITAL_COMMUNITY): Payer: Medicare Other

## 2018-07-11 ENCOUNTER — Encounter (HOSPITAL_COMMUNITY): Payer: Self-pay | Admitting: Emergency Medicine

## 2018-07-11 ENCOUNTER — Emergency Department (HOSPITAL_COMMUNITY)
Admission: EM | Admit: 2018-07-11 | Discharge: 2018-07-11 | Disposition: A | Discharge status: Home / Self Care | Payer: Medicare Other | Attending: Emergency Medicine | Admitting: Emergency Medicine

## 2018-07-11 DIAGNOSIS — E1165 Type 2 diabetes mellitus with hyperglycemia: Secondary | ICD-10-CM | POA: Insufficient documentation

## 2018-07-11 DIAGNOSIS — Z79899 Other long term (current) drug therapy: Secondary | ICD-10-CM | POA: Diagnosis not present

## 2018-07-11 DIAGNOSIS — I5043 Acute on chronic combined systolic (congestive) and diastolic (congestive) heart failure: Secondary | ICD-10-CM | POA: Diagnosis not present

## 2018-07-11 DIAGNOSIS — K5900 Constipation, unspecified: Secondary | ICD-10-CM | POA: Diagnosis not present

## 2018-07-11 DIAGNOSIS — R1084 Generalized abdominal pain: Secondary | ICD-10-CM

## 2018-07-11 DIAGNOSIS — L03317 Cellulitis of buttock: Secondary | ICD-10-CM | POA: Diagnosis not present

## 2018-07-11 LAB — COMPREHENSIVE METABOLIC PANEL
ALT: 12 U/L (ref 0–44)
AST: 16 U/L (ref 15–41)
Albumin: 3.4 g/dL — ABNORMAL LOW (ref 3.5–5.0)
Alkaline Phosphatase: 66 U/L (ref 38–126)
Anion gap: 8 (ref 5–15)
BUN: 24 mg/dL — ABNORMAL HIGH (ref 8–23)
CO2: 19 mmol/L — ABNORMAL LOW (ref 22–32)
Calcium: 8.6 mg/dL — ABNORMAL LOW (ref 8.9–10.3)
Chloride: 111 mmol/L (ref 98–111)
Creatinine, Ser: 1.12 mg/dL — ABNORMAL HIGH (ref 0.44–1.00)
GFR calc Af Amer: 58 mL/min — ABNORMAL LOW (ref >60)
GFR calc non Af Amer: 50 mL/min — ABNORMAL LOW (ref >60)
Glucose, Bld: 165 mg/dL — ABNORMAL HIGH (ref 70–99)
Potassium: 4.9 mmol/L (ref 3.5–5.1)
Sodium: 138 mmol/L (ref 135–145)
Total Bilirubin: 1 mg/dL (ref 0.3–1.2)
Total Protein: 6.4 g/dL — ABNORMAL LOW (ref 6.5–8.1)

## 2018-07-11 LAB — CBC WITH DIFFERENTIAL/PLATELET
Abs Immature Granulocytes: 0.02 10*3/uL (ref 0.00–0.07)
Basophils Absolute: 0.1 10*3/uL (ref 0.0–0.1)
Basophils Relative: 1 %
Eosinophils Absolute: 0.2 10*3/uL (ref 0.0–0.5)
Eosinophils Relative: 2 %
HCT: 37.6 % (ref 36.0–46.0)
Hemoglobin: 11.2 g/dL — ABNORMAL LOW (ref 12.0–15.0)
Immature Granulocytes: 0 %
Lymphocytes Relative: 13 %
Lymphs Abs: 0.9 10*3/uL (ref 0.7–4.0)
MCH: 31.5 pg (ref 26.0–34.0)
MCHC: 29.8 g/dL — ABNORMAL LOW (ref 30.0–36.0)
MCV: 105.9 fL — ABNORMAL HIGH (ref 80.0–100.0)
Monocytes Absolute: 0.4 10*3/uL (ref 0.1–1.0)
Monocytes Relative: 6 %
Neutro Abs: 5.9 10*3/uL (ref 1.7–7.7)
Neutrophils Relative %: 78 %
Platelets: 168 10*3/uL (ref 150–400)
RBC: 3.55 MIL/uL — ABNORMAL LOW (ref 3.87–5.11)
RDW: 15.2 % (ref 11.5–15.5)
WBC: 7.6 10*3/uL (ref 4.0–10.5)
nRBC: 0 % (ref 0.0–0.2)

## 2018-07-11 LAB — URINALYSIS, ROUTINE W REFLEX MICROSCOPIC
Bacteria, UA: NONE SEEN
Bilirubin Urine: NEGATIVE
Glucose, UA: NEGATIVE mg/dL
Ketones, ur: NEGATIVE mg/dL
Nitrite: NEGATIVE
Protein, ur: NEGATIVE mg/dL
Specific Gravity, Urine: 1.019 (ref 1.005–1.030)
pH: 6 (ref 5.0–8.0)

## 2018-07-11 LAB — MAGNESIUM: Magnesium: 1.9 mg/dL (ref 1.7–2.4)

## 2018-07-11 MED ORDER — DOXYCYCLINE HYCLATE 100 MG PO CAPS: 100.0000 mg | ORAL_CAPSULE | Freq: Two times a day (BID) | ORAL | 0 refills | Status: DC

## 2018-07-11 MED ORDER — IOPAMIDOL (ISOVUE-300) INJECTION 61%
100.0000 mL | Freq: Once | INTRAVENOUS | Status: AC | PRN
  Administered 2018-07-11: 100 mL via INTRAVENOUS

## 2018-07-11 MED ORDER — POLYETHYLENE GLYCOL 3350 17 G PO PACK
64.0000 g | PACK | Freq: Every day | ORAL | Status: DC
  Administered 2018-07-11: 64 g via ORAL
  Filled 2018-07-11: qty 4

## 2018-07-11 MED ORDER — POLYETHYLENE GLYCOL 3350 17 G PO PACK
34.0000 g | PACK | Freq: Every day | ORAL | Status: DC
  Administered 2018-07-11: 34 g via ORAL
  Filled 2018-07-11 (×3): qty 2

## 2018-07-11 MED ORDER — DOXYCYCLINE HYCLATE 100 MG PO TABS
100.0000 mg | ORAL_TABLET | Freq: Once | ORAL | Status: AC
  Administered 2018-07-11: 100 mg via ORAL
  Filled 2018-07-11: qty 1

## 2018-07-11 NOTE — ED Notes (Signed)
[facility] called @ 1522-per [place], RN-called by Marylene Land

## 2018-07-11 NOTE — ED Triage Notes (Signed)
Pt arrives via EMS from home. Pt lives in apartment by herself. EMS reports home smells like urine/no animals in the house. Pt has been urinating and defecating on carpet. Trash/food crumbs everywhere. Pt unkempt/ hasn't bathed/hair matted. Pt complains of constipation for one week. Pt has rectal pain when she has the urge to go to the restroom. EMS reports beginning stages of pressure ulcers on buttocks where pt sits in her chair all day. Pt has hx of Afib. BP 130/66, HR 80 Afib, resp 20. 94% on room air. CBG 254.
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
## 2018-07-11 NOTE — ED Notes (Signed)
Gave pt soap suds enema. Pt tolerated fairly well. Pt sitting on bedside commode attempting to have BM

## 2018-07-11 NOTE — Discharge Instructions (Addendum)
Finish your to go cup of miralax over the course of the afternoon/evening or stop once you have a good bowel movement.

## 2018-07-11 NOTE — ED Notes (Signed)
Pt able to have very small BM

## 2018-07-11 NOTE — ED Notes (Signed)
Pt transported to CT ?

## 2018-07-12 LAB — URINE CULTURE

## 2018-07-14 ENCOUNTER — Emergency Department (HOSPITAL_COMMUNITY)
Admission: EM | Admit: 2018-07-14 | Discharge: 2018-07-14 | Disposition: A | Discharge status: Home / Self Care | Payer: Medicare Other | Attending: Emergency Medicine | Admitting: Emergency Medicine

## 2018-07-14 ENCOUNTER — Other Ambulatory Visit: Payer: Self-pay

## 2018-07-14 DIAGNOSIS — Z7982 Long term (current) use of aspirin: Secondary | ICD-10-CM | POA: Diagnosis not present

## 2018-07-14 DIAGNOSIS — R197 Diarrhea, unspecified: Secondary | ICD-10-CM | POA: Diagnosis present

## 2018-07-14 DIAGNOSIS — I110 Hypertensive heart disease with heart failure: Secondary | ICD-10-CM | POA: Insufficient documentation

## 2018-07-14 DIAGNOSIS — E119 Type 2 diabetes mellitus without complications: Secondary | ICD-10-CM | POA: Diagnosis not present

## 2018-07-14 DIAGNOSIS — I5022 Chronic systolic (congestive) heart failure: Secondary | ICD-10-CM | POA: Diagnosis not present

## 2018-07-14 DIAGNOSIS — Z79899 Other long term (current) drug therapy: Secondary | ICD-10-CM | POA: Diagnosis not present

## 2018-07-14 MED ORDER — BISMUTH SUBSALICYLATE 262 MG/15ML PO SUSP
30.0000 mL | Freq: Once | ORAL | Status: AC
  Administered 2018-07-14: 30 mL via ORAL
  Filled 2018-07-14: qty 236

## 2018-07-14 NOTE — Discharge Instructions (Addendum)
You can take pepto bismol as directed on the bottle for diarrhea. Be aware, this may turn your stool black and this is a normal side effect. It is not recommending that you continue taking imodium.  It is important that you establish primary care to discuss your recurrent episodes of diarrhea.

## 2018-07-14 NOTE — ED Notes (Signed)
Discharge instructions given to patient. Pt given the opportunity to ask questions. Report given to [facility] for transport home. Pt being transferred to [facility] litter at this time for transport home.

## 2018-07-14 NOTE — ED Notes (Signed)
Pt is refusing to be discharged at this time. Pt reports that she would like to speak with the physician. MD made aware by this RN.
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
## 2018-07-14 NOTE — ED Triage Notes (Signed)
Pt coming per EMS from home with complaints of diarrhea since 2000. Pt states she has had about 3 episodes since last night. BP 131/93, HR 100 (history of irregular a-fib), 98% RA, 205 CBG. No other complaints of pain or symptoms.

## 2018-08-01 ENCOUNTER — Telehealth (HOSPITAL_COMMUNITY): Payer: Self-pay | Admitting: Licensed Clinical Social Worker

## 2018-08-01 NOTE — Telephone Encounter (Addendum)
Patient called stating need for assistance with over the counter anti diarrhea medication. Patient reported she is feeling better but in need of medication.  Patient states medication is an over the counter anti diarrhea and she will be able to get it next week when she gets her check. Patient reports feeling better and asymptomatic today. Patient has all of her medications and groceries delivered as she does not leave her home. CSW unable to obtain over the counter medication and suggested she follow up next week when she can obtain herself when she can have it delivered.  Lasandra Beech, LCSW, CCSW-MCS [phone]

## 2018-10-09 ENCOUNTER — Encounter (HOSPITAL_COMMUNITY): Payer: Self-pay

## 2018-10-09 ENCOUNTER — Emergency Department (HOSPITAL_COMMUNITY)
Admission: EM | Admit: 2018-10-09 | Discharge: 2018-10-09 | Disposition: A | Discharge status: Home / Self Care | Payer: Medicare Other | Attending: Emergency Medicine | Admitting: Emergency Medicine

## 2018-10-09 ENCOUNTER — Other Ambulatory Visit: Payer: Self-pay

## 2018-10-09 DIAGNOSIS — R531 Weakness: Secondary | ICD-10-CM | POA: Diagnosis not present

## 2018-10-09 DIAGNOSIS — Z9114 Patient's other noncompliance with medication regimen: Secondary | ICD-10-CM | POA: Diagnosis not present

## 2018-10-09 DIAGNOSIS — I5042 Chronic combined systolic (congestive) and diastolic (congestive) heart failure: Secondary | ICD-10-CM | POA: Insufficient documentation

## 2018-10-09 DIAGNOSIS — I110 Hypertensive heart disease with heart failure: Secondary | ICD-10-CM | POA: Diagnosis not present

## 2018-10-09 DIAGNOSIS — Z79899 Other long term (current) drug therapy: Secondary | ICD-10-CM | POA: Insufficient documentation

## 2018-10-09 DIAGNOSIS — R2689 Other abnormalities of gait and mobility: Secondary | ICD-10-CM | POA: Insufficient documentation

## 2018-10-09 DIAGNOSIS — K529 Noninfective gastroenteritis and colitis, unspecified: Secondary | ICD-10-CM | POA: Diagnosis not present

## 2018-10-09 DIAGNOSIS — M25561 Pain in right knee: Secondary | ICD-10-CM | POA: Diagnosis not present

## 2018-10-09 DIAGNOSIS — M25562 Pain in left knee: Secondary | ICD-10-CM | POA: Diagnosis not present

## 2018-10-09 DIAGNOSIS — E119 Type 2 diabetes mellitus without complications: Secondary | ICD-10-CM | POA: Insufficient documentation

## 2018-10-09 DIAGNOSIS — R197 Diarrhea, unspecified: Secondary | ICD-10-CM | POA: Diagnosis present

## 2018-10-09 DIAGNOSIS — Z7901 Long term (current) use of anticoagulants: Secondary | ICD-10-CM | POA: Insufficient documentation

## 2018-10-09 LAB — CBC WITH DIFFERENTIAL/PLATELET
Abs Immature Granulocytes: 0.02 10*3/uL (ref 0.00–0.07)
Basophils Absolute: 0.1 10*3/uL (ref 0.0–0.1)
Basophils Relative: 1 %
Eosinophils Absolute: 0.1 10*3/uL (ref 0.0–0.5)
Eosinophils Relative: 1 %
HCT: 38 % (ref 36.0–46.0)
Hemoglobin: 11.7 g/dL — ABNORMAL LOW (ref 12.0–15.0)
Immature Granulocytes: 0 %
Lymphocytes Relative: 14 %
Lymphs Abs: 1 10*3/uL (ref 0.7–4.0)
MCH: 32.5 pg (ref 26.0–34.0)
MCHC: 30.8 g/dL (ref 30.0–36.0)
MCV: 105.6 fL — ABNORMAL HIGH (ref 80.0–100.0)
Monocytes Absolute: 0.5 10*3/uL (ref 0.1–1.0)
Monocytes Relative: 7 %
Neutro Abs: 5.9 10*3/uL (ref 1.7–7.7)
Neutrophils Relative %: 77 %
Platelets: 151 10*3/uL (ref 150–400)
RBC: 3.6 MIL/uL — ABNORMAL LOW (ref 3.87–5.11)
RDW: 15.9 % — ABNORMAL HIGH (ref 11.5–15.5)
WBC: 7.6 10*3/uL (ref 4.0–10.5)
nRBC: 0 % (ref 0.0–0.2)

## 2018-10-09 LAB — COMPREHENSIVE METABOLIC PANEL
ALT: 12 U/L (ref 0–44)
AST: 13 U/L — ABNORMAL LOW (ref 15–41)
Albumin: 3.3 g/dL — ABNORMAL LOW (ref 3.5–5.0)
Alkaline Phosphatase: 71 U/L (ref 38–126)
Anion gap: 7 (ref 5–15)
BUN: 20 mg/dL (ref 8–23)
CO2: 21 mmol/L — ABNORMAL LOW (ref 22–32)
Calcium: 8.4 mg/dL — ABNORMAL LOW (ref 8.9–10.3)
Chloride: 111 mmol/L (ref 98–111)
Creatinine, Ser: 1.26 mg/dL — ABNORMAL HIGH (ref 0.44–1.00)
GFR calc Af Amer: 51 mL/min — ABNORMAL LOW (ref >60)
GFR calc non Af Amer: 44 mL/min — ABNORMAL LOW (ref >60)
Glucose, Bld: 116 mg/dL — ABNORMAL HIGH (ref 70–99)
Potassium: 3.9 mmol/L (ref 3.5–5.1)
Sodium: 139 mmol/L (ref 135–145)
Total Bilirubin: 1.2 mg/dL (ref 0.3–1.2)
Total Protein: 6.2 g/dL — ABNORMAL LOW (ref 6.5–8.1)

## 2018-10-09 LAB — BLOOD GAS, VENOUS
Acid-base deficit: 2.3 mmol/L — ABNORMAL HIGH (ref 0.0–2.0)
Bicarbonate: 22.2 mmol/L (ref 20.0–28.0)
O2 Saturation: 72.5 %
Patient temperature: 97.8
pCO2, Ven: 38.8 mmHg — ABNORMAL LOW (ref 44.0–60.0)
pH, Ven: 7.374 (ref 7.250–7.430)
pO2, Ven: 41.6 mmHg (ref 32.0–45.0)

## 2018-10-09 LAB — ETHANOL: Alcohol, Ethyl (B): <10 mg/dL (ref <10)

## 2018-10-09 MED ORDER — DIPHENOXYLATE-ATROPINE 2.5-0.025 MG PO TABS
2.0000 | ORAL_TABLET | Freq: Once | ORAL | Status: AC
  Administered 2018-10-09: 2 via ORAL
  Filled 2018-10-09: qty 2

## 2018-10-09 MED ORDER — SODIUM CHLORIDE 0.9 % IV BOLUS
1000.0000 mL | Freq: Once | INTRAVENOUS | Status: AC
  Administered 2018-10-09: 19:00:00 1000 mL via INTRAVENOUS

## 2018-10-09 MED ORDER — DIPHENOXYLATE-ATROPINE 2.5-0.025 MG/5ML PO LIQD: 10.0000 mL | Freq: Four times a day (QID) | ORAL | 0 refills | Status: DC | PRN

## 2018-10-09 NOTE — ED Triage Notes (Signed)
Pt BIB EMS from home. Pt reports diarrhea x 3weeks. Pt reports stopping taking her meds 3 weeks ago, pt reports having access to them but "didn't want to upset my stomach". Pt reports weakness that prevents mobility. Pt has hx of A Fib.   BP 140/80 Resp 18 Temp 98 164 CBG
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
## 2018-10-09 NOTE — ED Notes (Signed)
Patient reports she has not had her afib medication in 3 weeks. Pt currently in afib rhythm on heart monitor

## 2018-10-09 NOTE — ED Notes (Signed)
Patient is yelling at this RN stating that we are "just sending her home without helping and she doesn't know how to get her medicine and she can't walk and there is shit in her chair to be cleaned" RN explained to patient that there is no medical reason for her to be admitted and case management is going to work to assist in setting up home health. Patient is unsatisfied with this explanation and is unhappy about being discharged.

## 2018-10-09 NOTE — ED Notes (Signed)
This NT, Katilin, RN and Cletis Athens, RN attempted to stand pt at bedside and ambulate but pt stated, "I was not able to stand at home today and I know I won't be able to do so now". EDP Effie Shy is aware.

## 2018-10-09 NOTE — ED Notes (Signed)
Patient was moving her arm around and pulled the IV, causing it to infiltrate. RN removed IV, started another IV, and restarted fluids

## 2018-10-09 NOTE — Discharge Instructions (Addendum)
Use the resource guide to help you find a doctor to manage your chronic diarrhea.  Take all of your medication as prescribed.  We sent a prescription of liquid medication to your pharmacy to take for diarrhea.  You can either use this medicine, or Imodium as needed.  Home health services will come to your home to help treat you and see that you have other things you need to manage yourself.

## 2018-10-09 NOTE — ED Notes (Signed)
Bed: LN79 Expected date:  Expected time:  Means of arrival:  Comments: EMS 69 yo diarrhea rm 14

## 2018-10-09 NOTE — Care Management (Addendum)
CM will follow up with patient tomorrow to arrange [facility] services. CM was informed patient refuses to have ED staff assist with ambulation prior to discharge,states she is unable to ambulate, patient lives alone. CM spoke with RN, If patient is unable to ambulate patient should be transported by [facility].

## 2018-10-10 ENCOUNTER — Telehealth: Payer: Self-pay | Admitting: *Deleted

## 2018-10-10 NOTE — Telephone Encounter (Signed)
[facility] ED TOC CM -referral Home Health  TOC CM spoke to pt and offered choice for [facility]. Pt agreeable to [facility] set up with no preference to [facility] agency. Contacted [facility] with new referral. Contacted Eugenio Hoes, [facility] rep with new referral. Isidoro Donning RN CCM Case Mgmt phone [phone]

## 2018-10-12 ENCOUNTER — Telehealth: Payer: Self-pay | Admitting: *Deleted

## 2018-10-12 NOTE — Telephone Encounter (Signed)
EDCM received call regarding pt refusal of home health services.

## 2018-10-16 ENCOUNTER — Emergency Department (HOSPITAL_COMMUNITY): Payer: Medicare Other

## 2018-10-16 ENCOUNTER — Inpatient Hospital Stay (HOSPITAL_COMMUNITY)
Admission: EM | Admit: 2018-10-16 | Discharge: 2018-10-19 | DRG: 291 | Disposition: A | Discharge status: Skilled Nursing Facility | Payer: Medicare Other | Attending: Internal Medicine | Admitting: Internal Medicine

## 2018-10-16 ENCOUNTER — Inpatient Hospital Stay (HOSPITAL_COMMUNITY): Payer: Medicare Other

## 2018-10-16 ENCOUNTER — Other Ambulatory Visit: Payer: Self-pay

## 2018-10-16 ENCOUNTER — Encounter (HOSPITAL_COMMUNITY): Payer: Self-pay | Admitting: Emergency Medicine

## 2018-10-16 DIAGNOSIS — J9601 Acute respiratory failure with hypoxia: Secondary | ICD-10-CM | POA: Diagnosis present

## 2018-10-16 DIAGNOSIS — B356 Tinea cruris: Secondary | ICD-10-CM | POA: Diagnosis present

## 2018-10-16 DIAGNOSIS — K529 Noninfective gastroenteritis and colitis, unspecified: Secondary | ICD-10-CM | POA: Diagnosis not present

## 2018-10-16 DIAGNOSIS — I110 Hypertensive heart disease with heart failure: Secondary | ICD-10-CM | POA: Diagnosis present

## 2018-10-16 DIAGNOSIS — R531 Weakness: Secondary | ICD-10-CM

## 2018-10-16 DIAGNOSIS — Z7901 Long term (current) use of anticoagulants: Secondary | ICD-10-CM | POA: Diagnosis not present

## 2018-10-16 DIAGNOSIS — D696 Thrombocytopenia, unspecified: Secondary | ICD-10-CM | POA: Diagnosis present

## 2018-10-16 DIAGNOSIS — I4819 Other persistent atrial fibrillation: Secondary | ICD-10-CM | POA: Diagnosis present

## 2018-10-16 DIAGNOSIS — I2510 Atherosclerotic heart disease of native coronary artery without angina pectoris: Secondary | ICD-10-CM | POA: Diagnosis present

## 2018-10-16 DIAGNOSIS — E119 Type 2 diabetes mellitus without complications: Secondary | ICD-10-CM | POA: Diagnosis present

## 2018-10-16 DIAGNOSIS — I4891 Unspecified atrial fibrillation: Secondary | ICD-10-CM | POA: Diagnosis present

## 2018-10-16 DIAGNOSIS — D7589 Other specified diseases of blood and blood-forming organs: Secondary | ICD-10-CM | POA: Diagnosis not present

## 2018-10-16 DIAGNOSIS — Z833 Family history of diabetes mellitus: Secondary | ICD-10-CM

## 2018-10-16 DIAGNOSIS — Z79899 Other long term (current) drug therapy: Secondary | ICD-10-CM | POA: Diagnosis not present

## 2018-10-16 DIAGNOSIS — Z1159 Encounter for screening for other viral diseases: Secondary | ICD-10-CM

## 2018-10-16 DIAGNOSIS — I5023 Acute on chronic systolic (congestive) heart failure: Secondary | ICD-10-CM | POA: Diagnosis present

## 2018-10-16 DIAGNOSIS — R0902 Hypoxemia: Secondary | ICD-10-CM | POA: Diagnosis not present

## 2018-10-16 DIAGNOSIS — I10 Essential (primary) hypertension: Secondary | ICD-10-CM | POA: Diagnosis present

## 2018-10-16 DIAGNOSIS — Z9114 Patient's other noncompliance with medication regimen: Secondary | ICD-10-CM | POA: Diagnosis not present

## 2018-10-16 DIAGNOSIS — I248 Other forms of acute ischemic heart disease: Secondary | ICD-10-CM | POA: Diagnosis present

## 2018-10-16 DIAGNOSIS — I5031 Acute diastolic (congestive) heart failure: Secondary | ICD-10-CM | POA: Diagnosis not present

## 2018-10-16 DIAGNOSIS — Z7984 Long term (current) use of oral hypoglycemic drugs: Secondary | ICD-10-CM

## 2018-10-16 DIAGNOSIS — I482 Chronic atrial fibrillation, unspecified: Secondary | ICD-10-CM | POA: Diagnosis not present

## 2018-10-16 LAB — TROPONIN I
Troponin I: 0.04 ng/mL (ref <0.03)
Troponin I: 0.05 ng/mL (ref <0.03)
Troponin I: 0.05 ng/mL (ref <0.03)
Troponin I: 0.05 ng/mL (ref <0.03)

## 2018-10-16 LAB — SARS CORONAVIRUS 2 BY RT PCR (HOSPITAL ORDER, PERFORMED IN ~~LOC~~ HOSPITAL LAB): SARS Coronavirus 2: NEGATIVE

## 2018-10-16 LAB — COMPREHENSIVE METABOLIC PANEL
ALT: 8 U/L (ref 0–44)
AST: 10 U/L — ABNORMAL LOW (ref 15–41)
Albumin: 3.1 g/dL — ABNORMAL LOW (ref 3.5–5.0)
Alkaline Phosphatase: 64 U/L (ref 38–126)
Anion gap: 6 (ref 5–15)
BUN: 13 mg/dL (ref 8–23)
CO2: 21 mmol/L — ABNORMAL LOW (ref 22–32)
Calcium: 7.9 mg/dL — ABNORMAL LOW (ref 8.9–10.3)
Chloride: 110 mmol/L (ref 98–111)
Creatinine, Ser: 0.99 mg/dL (ref 0.44–1.00)
GFR calc Af Amer: >60 mL/min (ref >60)
GFR calc non Af Amer: 59 mL/min — ABNORMAL LOW (ref >60)
Glucose, Bld: 110 mg/dL — ABNORMAL HIGH (ref 70–99)
Potassium: 3.8 mmol/L (ref 3.5–5.1)
Sodium: 137 mmol/L (ref 135–145)
Total Bilirubin: 1.1 mg/dL (ref 0.3–1.2)
Total Protein: 5.9 g/dL — ABNORMAL LOW (ref 6.5–8.1)

## 2018-10-16 LAB — CBC WITH DIFFERENTIAL/PLATELET
Abs Immature Granulocytes: 0.03 10*3/uL (ref 0.00–0.07)
Basophils Absolute: 0.1 10*3/uL (ref 0.0–0.1)
Basophils Relative: 1 %
Eosinophils Absolute: 0.1 10*3/uL (ref 0.0–0.5)
Eosinophils Relative: 2 %
HCT: 38.8 % (ref 36.0–46.0)
Hemoglobin: 11.7 g/dL — ABNORMAL LOW (ref 12.0–15.0)
Immature Granulocytes: 0 %
Lymphocytes Relative: 12 %
Lymphs Abs: 0.9 10*3/uL (ref 0.7–4.0)
MCH: 31.7 pg (ref 26.0–34.0)
MCHC: 30.2 g/dL (ref 30.0–36.0)
MCV: 105.1 fL — ABNORMAL HIGH (ref 80.0–100.0)
Monocytes Absolute: 0.7 10*3/uL (ref 0.1–1.0)
Monocytes Relative: 9 %
Neutro Abs: 5.5 10*3/uL (ref 1.7–7.7)
Neutrophils Relative %: 76 %
Platelets: 117 10*3/uL — ABNORMAL LOW (ref 150–400)
RBC: 3.69 MIL/uL — ABNORMAL LOW (ref 3.87–5.11)
RDW: 15.7 % — ABNORMAL HIGH (ref 11.5–15.5)
WBC: 7.4 10*3/uL (ref 4.0–10.5)
nRBC: 0 % (ref 0.0–0.2)

## 2018-10-16 LAB — GLUCOSE, CAPILLARY
Glucose-Capillary: 127 mg/dL — ABNORMAL HIGH (ref 70–99)
Glucose-Capillary: 108 mg/dL — ABNORMAL HIGH (ref 70–99)
Glucose-Capillary: 139 mg/dL — ABNORMAL HIGH (ref 70–99)
Glucose-Capillary: 111 mg/dL — ABNORMAL HIGH (ref 70–99)

## 2018-10-16 LAB — LACTATE DEHYDROGENASE: LDH: 149 U/L (ref 98–192)

## 2018-10-16 LAB — MAGNESIUM: Magnesium: 1.9 mg/dL (ref 1.7–2.4)

## 2018-10-16 LAB — MRSA PCR SCREENING: MRSA by PCR: NEGATIVE

## 2018-10-16 LAB — BRAIN NATRIURETIC PEPTIDE: B Natriuretic Peptide: 1551.7 pg/mL — ABNORMAL HIGH (ref 0.0–100.0)

## 2018-10-16 LAB — SAVE SMEAR(SSMR), FOR PROVIDER SLIDE REVIEW

## 2018-10-16 MED ORDER — INSULIN ASPART 100 UNIT/ML ~~LOC~~ SOLN
0.0000 [IU] | Freq: Three times a day (TID) | SUBCUTANEOUS | Status: DC
  Administered 2018-10-16 – 2018-10-17 (×3): 1 [IU] via SUBCUTANEOUS

## 2018-10-16 MED ORDER — APIXABAN 5 MG PO TABS
5.0000 mg | ORAL_TABLET | Freq: Two times a day (BID) | ORAL | Status: DC
  Administered 2018-10-16 – 2018-10-19 (×6): 5 mg via ORAL
  Filled 2018-10-16 (×7): qty 1

## 2018-10-16 MED ORDER — SODIUM CHLORIDE 0.9 % IV SOLN: 250.0000 mL | INTRAVENOUS | Status: DC | PRN

## 2018-10-16 MED ORDER — ACETAMINOPHEN 325 MG PO TABS: 650.0000 mg | ORAL_TABLET | ORAL | Status: DC | PRN

## 2018-10-16 MED ORDER — METOPROLOL SUCCINATE ER 50 MG PO TB24
50.0000 mg | ORAL_TABLET | Freq: Every day | ORAL | Status: DC
  Administered 2018-10-16 – 2018-10-19 (×3): 50 mg via ORAL
  Filled 2018-10-16: qty 2
  Filled 2018-10-16 (×2): qty 1
  Filled 2018-10-16: qty 2

## 2018-10-16 MED ORDER — FUROSEMIDE 10 MG/ML IJ SOLN
40.0000 mg | Freq: Two times a day (BID) | INTRAMUSCULAR | Status: DC
  Administered 2018-10-16 – 2018-10-19 (×3): 40 mg via INTRAVENOUS
  Filled 2018-10-16 (×4): qty 4

## 2018-10-16 MED ORDER — INSULIN ASPART 100 UNIT/ML ~~LOC~~ SOLN: 0.0000 [IU] | Freq: Every day | SUBCUTANEOUS | Status: DC

## 2018-10-16 MED ORDER — FUROSEMIDE 40 MG PO TABS: 40.0000 mg | ORAL_TABLET | Freq: Every day | ORAL | Status: DC

## 2018-10-16 MED ORDER — SODIUM CHLORIDE 0.9% FLUSH: 3.0000 mL | INTRAVENOUS | Status: DC | PRN

## 2018-10-16 MED ORDER — VITAMIN B-12 1000 MCG PO TABS
1000.0000 ug | ORAL_TABLET | Freq: Every day | ORAL | Status: DC
  Administered 2018-10-17 – 2018-10-19 (×2): 1000 ug via ORAL
  Filled 2018-10-16 (×4): qty 1

## 2018-10-16 MED ORDER — LOSARTAN POTASSIUM 50 MG PO TABS
25.0000 mg | ORAL_TABLET | Freq: Every day | ORAL | Status: DC
  Filled 2018-10-16 (×2): qty 1

## 2018-10-16 MED ORDER — CHLORHEXIDINE GLUCONATE CLOTH 2 % EX PADS
6.0000 | MEDICATED_PAD | Freq: Every day | CUTANEOUS | Status: DC
  Administered 2018-10-17: 6 via TOPICAL

## 2018-10-16 MED ORDER — ONDANSETRON HCL 4 MG/2ML IJ SOLN: 4.0000 mg | Freq: Four times a day (QID) | INTRAMUSCULAR | Status: DC | PRN

## 2018-10-16 MED ORDER — LOPERAMIDE HCL 2 MG PO CAPS
2.0000 mg | ORAL_CAPSULE | Freq: Four times a day (QID) | ORAL | Status: DC | PRN
  Administered 2018-10-17 – 2018-10-18 (×3): 2 mg via ORAL
  Filled 2018-10-16 (×3): qty 1

## 2018-10-16 MED ORDER — FUROSEMIDE 10 MG/ML IJ SOLN
40.0000 mg | Freq: Once | INTRAMUSCULAR | Status: DC
  Filled 2018-10-16: qty 4

## 2018-10-16 MED ORDER — DIGOXIN 125 MCG PO TABS
0.1250 mg | ORAL_TABLET | Freq: Every day | ORAL | Status: DC
  Administered 2018-10-16 – 2018-10-19 (×3): 0.125 mg via ORAL
  Filled 2018-10-16 (×4): qty 1

## 2018-10-16 MED ORDER — FLUCONAZOLE 100MG IVPB
100.0000 mg | INTRAVENOUS | Status: DC
  Administered 2018-10-16: 100 mg via INTRAVENOUS
  Filled 2018-10-16: qty 50

## 2018-10-16 MED ORDER — FUROSEMIDE 10 MG/ML IJ SOLN: 40.0000 mg | Freq: Every day | INTRAMUSCULAR | Status: DC

## 2018-10-16 MED ORDER — SODIUM CHLORIDE 0.9% FLUSH
3.0000 mL | Freq: Two times a day (BID) | INTRAVENOUS | Status: DC
  Administered 2018-10-16 – 2018-10-19 (×5): 3 mL via INTRAVENOUS

## 2018-10-16 MED ORDER — ORAL CARE MOUTH RINSE
15.0000 mL | Freq: Two times a day (BID) | OROMUCOSAL | Status: DC
  Administered 2018-10-16 – 2018-10-19 (×4): 15 mL via OROMUCOSAL
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
## 2018-10-16 NOTE — Evaluation (Signed)
Physical Therapy Evaluation Patient Details Name: Kaitlyn [name] MRN: [id number] DOB: 04/22/1951 Today's Date: 10/16/2018   History of Present Illness  68 yo female admitted with acute resp failure, CHF exac, bil LE edema. Hx of A fib, CAD, chronic diarrhea, obesity, medical noncompliance, multiple ED visits.   Clinical Impression  On eval, pt required Min-Mod Assist for bed mobility. Pt refused to attempt any OOB activity despite encouragement. Pt is fixated on going home today despite not knowing if she is able to safely mobilize. Pt exhibits poor insight into her medical condition and discharge plan. Will follow and progress activity as pt will allow.     Follow Up Recommendations SNF    Equipment Recommendations  None recommended by PT    Recommendations for Other Services       Precautions / Restrictions Precautions Precautions: Fall Restrictions Weight Bearing Restrictions: No      Mobility  Bed Mobility Overal bed mobility: Needs Assistance Bed Mobility: Supine to Sit;Sit to Supine     Supine to sit: Min assist;HOB elevated Sit to supine: HOB elevated;Mod assist   General bed mobility comments: Assist for LEs. Increased time. Pt relied on bedrail.   Transfers                 General transfer comment: NT-pt refused to attempt standing  Ambulation/Gait                Stairs            Wheelchair Mobility    Modified Rankin (Stroke Patients Only)       Balance Overall balance assessment: Needs assistance   Sitting balance-Leahy Scale: Good       Standing balance-Leahy Scale: Poor                               Pertinent Vitals/Pain Pain Assessment: No/denies pain    Home Living Family/patient expects to be discharged to:: Private residence Living Arrangements: Alone   Type of Home: Apartment Home Access: Level entry     Home Layout: One level Home Equipment: Walker - 2 wheels;Wheelchair - manual      Prior  Function Level of Independence: Independent with assistive device(s)               Hand Dominance        Extremity/Trunk Assessment   Upper Extremity Assessment Upper Extremity Assessment: Defer to OT evaluation    Lower Extremity Assessment Lower Extremity Assessment: Generalized weakness(bil LE edema)    Cervical / Trunk Assessment Cervical / Trunk Assessment: Normal  Communication   Communication: No difficulties  Cognition Arousal/Alertness: Awake/alert Behavior During Therapy: Anxious Overall Cognitive Status: Impaired/Different from baseline Area of Impairment: Safety/judgement                         Safety/Judgement: Decreased awareness of safety     General Comments: poor insight      General Comments      Exercises     Assessment/Plan    PT Assessment Patient needs continued PT services  PT Problem List Decreased strength;Decreased balance;Decreased activity tolerance;Decreased mobility;Decreased knowledge of use of DME;Decreased safety awareness       PT Treatment Interventions DME instruction;Gait training;Therapeutic activities;Functional mobility training;Balance training;Patient/family education;Therapeutic exercise    PT Goals (Current goals can be found in the Care Plan section)  Acute Rehab PT Goals Patient Stated Goal: home!  PT Goal Formulation: With patient Time For Goal Achievement: 10/30/18 Potential to Achieve Goals: Fair    Frequency Min 3X/week   Barriers to discharge        Co-evaluation               AM-PAC PT "6 Clicks" Mobility  Outcome Measure Help needed turning from your back to your side while in a flat bed without using bedrails?: A Lot Help needed moving from lying on your back to sitting on the side of a flat bed without using bedrails?: A Little Help needed moving to and from a bed to a chair (including a wheelchair)?: A Lot Help needed standing up from a chair using your arms (e.g.,  wheelchair or bedside chair)?: A Lot Help needed to walk in hospital room?: Total Help needed climbing 3-5 steps with a railing? : Total 6 Click Score: 11    End of Session   Activity Tolerance: Patient tolerated treatment well Patient left: in bed;with call bell/phone within reach;with bed alarm set   PT Visit Diagnosis: Muscle weakness (generalized) (M62.81);Difficulty in walking, not elsewhere classified (R26.2)    Time: 4383-7793 PT Time Calculation (min) (ACUTE ONLY): 18 min   Charges:   PT Evaluation $PT Eval Moderate Complexity: 1 Mod           Rebeca Alert, PT Acute Rehabilitation Services Pager: [phone] Office: [phone]

## 2018-10-16 NOTE — ED Notes (Signed)
Bed: GE95 Expected date:  Expected time:  Means of arrival:  Comments: 46F weakness
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
## 2018-10-16 NOTE — Progress Notes (Signed)
  Echocardiogram 2D Echocardiogram was attempted but patients heart rate was too high. We will try again later.   Kaitlyn [name] 10/16/2018, 2:19 PM

## 2018-10-16 NOTE — ED Notes (Signed)
Pt placed on 1L O2

## 2018-10-16 NOTE — ED Notes (Signed)
Upon entering the patient's room, the pt has taken off the nasal cannula. Previous vital signs reflective of this.

## 2018-10-16 NOTE — ED Notes (Addendum)
While in the room, the O2 sats fluctuated between 83-93%.

## 2018-10-16 NOTE — Progress Notes (Signed)
Upon admission to unit patient has severe Moisture associated skin damage as well as fungal growth beneath breasts and panis, patient states she was on antifungal powder at home. Patient states she hasn't been able to care for her self for the last two weeks and has only been getting by using a rolling computer chair to cook and toilet. Patient states she hasn't been taking meds due to concerns of diarrhea including afib medications. Patient heavily soiled and hair matted, patient states she is unable to bathe properly as well social work consult placed. Pateint states she has no help to care for herself but prescriptions are delivered via [employer] and groceries delivered through [employer].

## 2018-10-16 NOTE — ED Notes (Signed)
Patient reminded of need for urine sample, [employer] attached and suctioning.

## 2018-10-16 NOTE — Progress Notes (Signed)
Patient seen and examined (No charge, pt admitted after MN today). She is on IV lasix 40 BID for a/c systolic CHF exacerbation. She also has HTN and atrial fib, hx CAD, chronic diarrhea. Will start IV diflucan for sig fungal rashes intertriginous areas. Looks unable to care for herself at home, SW consulted by RN and PT/OT consults pending as well.  She moved from [place], has family in [place], [place] and back in [place], none here.    Vinson Moselle MD/ [facility]   pgr [phone] 10/16/2018, 1:17 PM

## 2018-10-17 ENCOUNTER — Inpatient Hospital Stay (HOSPITAL_COMMUNITY): Payer: Medicare Other

## 2018-10-17 DIAGNOSIS — I5031 Acute diastolic (congestive) heart failure: Secondary | ICD-10-CM

## 2018-10-17 LAB — LIPID PANEL
Cholesterol: 92 mg/dL (ref 0–200)
HDL: 35 mg/dL — ABNORMAL LOW (ref >40)
LDL Cholesterol: 45 mg/dL (ref 0–99)
Total CHOL/HDL Ratio: 2.6 RATIO
Triglycerides: 59 mg/dL (ref <150)
VLDL: 12 mg/dL (ref 0–40)

## 2018-10-17 LAB — URINALYSIS, ROUTINE W REFLEX MICROSCOPIC
Bilirubin Urine: NEGATIVE
Glucose, UA: NEGATIVE mg/dL
Hgb urine dipstick: NEGATIVE
Ketones, ur: NEGATIVE mg/dL
Nitrite: NEGATIVE
Protein, ur: NEGATIVE mg/dL
Specific Gravity, Urine: 1.008 (ref 1.005–1.030)
pH: 6 (ref 5.0–8.0)

## 2018-10-17 LAB — ECHOCARDIOGRAM COMPLETE
Height: 71 in
Weight: 4179.92 oz

## 2018-10-17 LAB — BASIC METABOLIC PANEL
Anion gap: 8 (ref 5–15)
BUN: 17 mg/dL (ref 8–23)
CO2: 23 mmol/L (ref 22–32)
Calcium: 7.8 mg/dL — ABNORMAL LOW (ref 8.9–10.3)
Chloride: 106 mmol/L (ref 98–111)
Creatinine, Ser: 1.16 mg/dL — ABNORMAL HIGH (ref 0.44–1.00)
GFR calc Af Amer: 56 mL/min — ABNORMAL LOW (ref >60)
GFR calc non Af Amer: 48 mL/min — ABNORMAL LOW (ref >60)
Glucose, Bld: 98 mg/dL (ref 70–99)
Potassium: 4.1 mmol/L (ref 3.5–5.1)
Sodium: 137 mmol/L (ref 135–145)

## 2018-10-17 LAB — CBC
HCT: 41.1 % (ref 36.0–46.0)
Hemoglobin: 12 g/dL (ref 12.0–15.0)
MCH: 31.3 pg (ref 26.0–34.0)
MCHC: 29.2 g/dL — ABNORMAL LOW (ref 30.0–36.0)
MCV: 107.3 fL — ABNORMAL HIGH (ref 80.0–100.0)
Platelets: 117 10*3/uL — ABNORMAL LOW (ref 150–400)
RBC: 3.83 MIL/uL — ABNORMAL LOW (ref 3.87–5.11)
RDW: 15.5 % (ref 11.5–15.5)
WBC: 8.8 10*3/uL (ref 4.0–10.5)
nRBC: 0 % (ref 0.0–0.2)

## 2018-10-17 LAB — GLUCOSE, CAPILLARY
Glucose-Capillary: 67 mg/dL — ABNORMAL LOW (ref 70–99)
Glucose-Capillary: 82 mg/dL (ref 70–99)
Glucose-Capillary: 101 mg/dL — ABNORMAL HIGH (ref 70–99)
Glucose-Capillary: 127 mg/dL — ABNORMAL HIGH (ref 70–99)
Glucose-Capillary: 71 mg/dL (ref 70–99)

## 2018-10-17 LAB — HEMOGLOBIN A1C
Hgb A1c MFr Bld: 6 % — ABNORMAL HIGH (ref 4.8–5.6)
Mean Plasma Glucose: 125.5 mg/dL

## 2018-10-17 LAB — TSH: TSH: 2.409 u[IU]/mL (ref 0.350–4.500)

## 2018-10-17 MED ORDER — PRO-STAT SUGAR FREE PO LIQD
30.0000 mL | Freq: Two times a day (BID) | ORAL | Status: DC
  Administered 2018-10-17 – 2018-10-19 (×3): 30 mL via ORAL
  Filled 2018-10-17 (×3): qty 30

## 2018-10-17 MED ORDER — ADULT MULTIVITAMIN W/MINERALS CH
1.0000 | ORAL_TABLET | Freq: Every day | ORAL | Status: DC
  Administered 2018-10-17: 1 via ORAL
  Filled 2018-10-17 (×2): qty 1

## 2018-10-17 MED ORDER — FLUCONAZOLE 100 MG PO TABS
100.0000 mg | ORAL_TABLET | Freq: Every day | ORAL | Status: DC
  Administered 2018-10-17: 100 mg via ORAL
  Filled 2018-10-17 (×2): qty 1

## 2018-10-17 MED ORDER — GLUCERNA SHAKE PO LIQD
237.0000 mL | Freq: Two times a day (BID) | ORAL | Status: DC
  Administered 2018-10-19: 237 mL via ORAL
  Filled 2018-10-17 (×6): qty 237

## 2018-10-17 NOTE — Progress Notes (Signed)
  Echocardiogram 2D Echocardiogram has been performed.  Kaitlyn [name] L Androw 10/17/2018, 3:34 PM

## 2018-10-17 NOTE — Progress Notes (Signed)
Pt refused IV Lasix.  Stated she didn't take that at home and did not want to here.  Explained to patient reason for the Lasix and why she needed it but patient continued to refuse. Nino Parsley

## 2018-10-17 NOTE — TOC Initial Note (Signed)
Transition of Care [facility]) - Initial/Assessment Note    Patient Details  Name: Kaitlyn [name] MRN: [id number] Date of Birth: 1950/07/24  Transition of Care [facility]) CM/SW Contact:    Armanda Heritage, RN Phone Number: 10/17/2018, 11:54 AM  Clinical Narrative:    CM spoke to patient at bedside about recommendation for SNF.  Patient initially reluctant and states wants tot return to her apartment at d/c.  CM spoke to patient about her ADL's and how patient would care for herself at discharge. Patient acknowledges that she would be unable to care for herself as she is not able to ambulate at this time and live alone with no family in the area. Does report she has a neighbor that helps her put away her groceries after they are delivered but does not have any support in home to meet her needs.  After our conversation patient is willing to explore SNF options for short term rehab, but wants to stay in [place]. CM faxed out FL2, awaiting response with offers.                  Expected Discharge Plan: Skilled Nursing Facility Barriers to Discharge: Continued Medical Work up   Patient Goals and CMS Choice Patient states their goals for this hospitalization and ongoing recovery are:: wants to go to her apartment       Expected Discharge Plan and Services Expected Discharge Plan: Skilled Nursing Facility       Living arrangements for the past 2 months: Apartment                                      Prior Living Arrangements/Services Living arrangements for the past 2 months: Apartment Lives with:: Self Patient language and need for interpreter reviewed:: Yes Do you feel safe going back to the place where you live?: Yes      Need for Family Participation in Patient Care: Yes (Comment) Care giver support system in place?: No (comment)(lives alone, no family in area, has a neighbor that helps)   Criminal Activity/Legal Involvement Pertinent to Current Situation/Hospitalization:  No - Comment as needed  Activities of Daily Living Home Assistive Devices/Equipment: Other (Comment)(computer chair) ADL Screening (condition at time of admission) Patient's cognitive ability adequate to safely complete daily activities?: Yes Is the patient deaf or have difficulty hearing?: No Does the patient have difficulty seeing, even when wearing glasses/contacts?: No Does the patient have difficulty concentrating, remembering, or making decisions?: No Patient able to express need for assistance with ADLs?: Yes Does the patient have difficulty dressing or bathing?: Yes Independently performs ADLs?: No Grooming: Needs assistance Bathing: Needs assistance Walks in Home: Needs assistance Does the patient have difficulty walking or climbing stairs?: Yes Weakness of Legs: Left Weakness of Arms/Hands: None  Permission Sought/Granted Permission sought to share information with : Facility Contact Representative(permission to Fax out [employer]) Permission granted to share information with : Yes, Verbal Permission Granted              Emotional Assessment Appearance:: Appears stated age Attitude/Demeanor/Rapport: Engaged Affect (typically observed): Accepting, Angry Orientation: : Oriented to Self, Oriented to Place, Oriented to  Time, Oriented to Situation   Psych Involvement: No (comment)  Admission diagnosis:  Hypoxia [R09.02] Generalized weakness [R53.1] Patient Active Problem List   Diagnosis Date Noted  . Chronic diarrhea 10/16/2018  . Thrombocytopenia (HCC) 10/16/2018  . CAD (coronary artery disease)  10/16/2018  . Acute respiratory failure with hypoxia (HCC) 10/16/2018  . Generalized weakness 10/16/2018  . Anemia 11/23/2017  . NSTEMI (non-ST elevated myocardial infarction) (HCC) 01/09/2017  . Ischemic cardiomyopathy 01/09/2017  . Obesity, Class III, BMI 40-49.9 (morbid obesity) (HCC) 01/09/2017  . Chest pain   . Diarrhea   . Anaphylaxis 12/04/2015  . Hypotension due to  hypovolemia 12/01/2015  . Leukocytosis   . Type 2 diabetes mellitus with hyperglycemia, with long-term current use of insulin (HCC)   . Lactic acidosis 07/07/2015  . Dehydration 07/07/2015  . Angioedema 07/07/2015  . Allergic reaction 07/07/2015  . Acute on chronic combined systolic and diastolic heart failure (HCC) 03/24/2015  . Atrial fibrillation with rapid ventricular response (HCC)   . Shortness of breath 12/14/2014  . Diabetes mellitus without complication (HCC)   . Thyroid nodule   . Seizures (HCC)   . Hypokalemia   . Hypomagnesemia   . Lymphadenopathy 09/24/2014  . Macrocytosis   . Essential hypertension   . Psychosocial impairment   . Acute on chronic systolic CHF (congestive heart failure) (HCC) 04/27/2014  . H/O noncompliance with medical treatment, presenting hazards to health 03/23/2014  . Chronic atrial fibrillation 03/23/2014   PCP:  Laurey Morale, MD Pharmacy:   [facility] - [place], [place] - [address] [place] [address] Phone: [phone] Fax: [phone]     Social Determinants of Health (SDOH) Interventions    Readmission Risk Interventions Readmission Risk Prevention Plan 10/17/2018  Transportation Screening Complete  PCP or Specialist Appt within 3-5 Days Not Complete  Not Complete comments plan to d/c to SNF  HRI or Home Care Consult Complete  Social Work Consult for Recovery Care Planning/Counseling Complete  Palliative Care Screening Not Applicable  Medication Review Oceanographer) Complete  Some recent data might be hidden

## 2018-10-17 NOTE — Progress Notes (Signed)
Initial Nutrition Assessment  RD working remotely.  DOCUMENTATION CODES:   Obesity unspecified  INTERVENTION:   - Glucerna Shake po BID, each supplement provides 220 kcal and 10 grams of protein  - Pro-stat 30 ml BID, each packet provides 100 kcal and 15 grams of protein  - MVI with minerals daily  NUTRITION DIAGNOSIS:   Increased nutrient needs related to chronic illness (CHF) as evidenced by estimated needs.  GOAL:   Patient will meet greater than or equal to 90% of their needs  MONITOR:   PO intake, Supplement acceptance, Labs, Weight trends, I & O's  REASON FOR ASSESSMENT:   Malnutrition Screening Tool    ASSESSMENT:   68 year old female who presented to the ED on 6/01 with weakness. PMH of CHF, A-fib, T2DM, CAD, HTN, chronic diarrhea. Pt admitted with acute on chronic CHF exacerbation.  Therapies recommending SNF. However, pt refusing.  No meal completions recorded in pt's chart at this time. Unable to reach pt by phone call to room.  Reviewed weight history in chart. Pt with weight fluctuations between 113-127 kg over the last 11 months. Several weights appear to be pulled from previous encounters rather than true weights on that date. Overall, pt with 4 kg weight loss since 11/29/17. This is a 3.3% weight loss which is not significant for timeframe.  RD will order Glucerna Shake and Pro-stat to aid pt in meeting kcal and protein needs during admission. Will also order daily MVI.  Reviewed RN edema assessment. Pt with moderate pitting edema to BLE.  Medications reviewed and include: Lasix, SSI, vitamin B-12  Labs reviewed: HDL 35 (L), hemoglobin A1C 6.0 CBG's: 67-139 x 24 hours  UOP: 2100 ml x 24 hours I/O's: -2.0 L since admit  NUTRITION - FOCUSED PHYSICAL EXAM:  Unable to complete at this time. RD working remotely.  Diet Order:   Diet Order            Diet 2 gram sodium Room service appropriate? Yes; Fluid consistency: Thin  Diet effective now              EDUCATION NEEDS:   Not appropriate for education at this time  Skin:  Skin Assessment: Skin Integrity Issues: Other: MASD/rash to groin, breasts, perineum  Last BM:  10/17/18 medium type 6  Height:   Ht Readings from Last 1 Encounters:  10/16/18 5\' 11"  (1.803 m)    Weight:   Wt Readings from Last 1 Encounters:  10/16/18 118.5 kg    Ideal Body Weight:  70.45 kg  BMI:  Body mass index is 36.44 kg/m.  Estimated Nutritional Needs:   Kcal:  1900-2100  Protein:  100-115 grams  Fluid:  1.8 L    Kaitlyn Reading, MS, RD, LDN Inpatient Clinical Dietitian Pager: [phone] Weekend/After Hours: [phone]

## 2018-10-17 NOTE — [place] FL2 (Signed)
[place] MEDICAID FL2 LEVEL OF CARE SCREENING TOOL     IDENTIFICATION  Patient Name: Kaitlyn [name] Birthdate: 1950/08/26 Sex: female Admission Date (Current Location): 10/16/2018  [facility] and [place] Number:  Producer, television/film/video and Address:  [facility],  [address], [place] [address]      Provider Number: [id number]  Attending Physician Name and Address:  Burnadette Pop, MD  Relative Name and Phone Number:       Current Level of Care: Hospital Recommended Level of Care: Skilled Nursing Facility Prior Approval Number:    Date Approved/Denied:   PASRR Number: [id number]  Discharge Plan: SNF    Current Diagnoses: Patient Active Problem List   Diagnosis Date Noted  . Chronic diarrhea 10/16/2018  . Thrombocytopenia (HCC) 10/16/2018  . CAD (coronary artery disease) 10/16/2018  . Acute respiratory failure with hypoxia (HCC) 10/16/2018  . Generalized weakness 10/16/2018  . Anemia 11/23/2017  . NSTEMI (non-ST elevated myocardial infarction) (HCC) 01/09/2017  . Ischemic cardiomyopathy 01/09/2017  . Obesity, Class III, BMI 40-49.9 (morbid obesity) (HCC) 01/09/2017  . Chest pain   . Diarrhea   . Anaphylaxis 12/04/2015  . Hypotension due to hypovolemia 12/01/2015  . Leukocytosis   . Type 2 diabetes mellitus with hyperglycemia, with long-term current use of insulin (HCC)   . Lactic acidosis 07/07/2015  . Dehydration 07/07/2015  . Angioedema 07/07/2015  . Allergic reaction 07/07/2015  . Acute on chronic combined systolic and diastolic heart failure (HCC) 03/24/2015  . Atrial fibrillation with rapid ventricular response (HCC)   . Shortness of breath 12/14/2014  . Diabetes mellitus without complication (HCC)   . Thyroid nodule   . Seizures (HCC)   . Hypokalemia   . Hypomagnesemia   . Lymphadenopathy 09/24/2014  . Macrocytosis   . Essential hypertension   . Psychosocial impairment   . Acute on chronic systolic CHF (congestive heart failure) (HCC)  04/27/2014  . H/O noncompliance with medical treatment, presenting hazards to health 03/23/2014  . Chronic atrial fibrillation 03/23/2014    Orientation RESPIRATION BLADDER Height & Weight     Self, Time, Situation, Place  Normal Incontinent Weight: 118.5 kg Height:  5\' 11"  (180.3 cm)  BEHAVIORAL SYMPTOMS/MOOD NEUROLOGICAL BOWEL NUTRITION STATUS      Incontinent Diet  AMBULATORY STATUS COMMUNICATION OF NEEDS Skin   Extensive Assist Verbally Normal                       Personal Care Assistance Level of Assistance  Bathing, Dressing Bathing Assistance: Maximum assistance   Dressing Assistance: Maximum assistance     Functional Limitations Info             SPECIAL CARE FACTORS FREQUENCY  PT (By licensed PT), OT (By licensed OT)     PT Frequency: 5x week OT Frequency: 5x week            Contractures Contractures Info: Not present    Additional Factors Info  Code Status, Allergies Code Status Info: Full Allergies Info: caffeine, cranberry, lisinopril, penicillins, stimulants           Current Medications (10/17/2018):  This is the current hospital active medication list Current Facility-Administered Medications  Medication Dose Route Frequency Provider Last Rate Last Dose  . 0.9 %  sodium chloride infusion  250 mL Intravenous PRN Lorretta Harp, MD      . acetaminophen (TYLENOL) tablet 650 mg  650 mg Oral Q4H PRN Lorretta Harp, MD      .  apixaban (ELIQUIS) tablet 5 mg  5 mg Oral BID Lorretta Harp, MD   5 mg at 10/17/18 0957  . Chlorhexidine Gluconate Cloth 2 % PADS 6 each  6 each Topical Q0600 Delano Metz, MD   6 each at 10/17/18 [phone]  . digoxin (LANOXIN) tablet 0.125 mg  0.125 mg Oral Daily Lorretta Harp, MD   0.125 mg at 10/17/18 0956  . feeding supplement (GLUCERNA SHAKE) (GLUCERNA SHAKE) liquid 237 mL  237 mL Oral BID BM Adhikari, Amrit, MD      . feeding supplement (PRO-STAT SUGAR FREE 64) liquid 30 mL  30 mL Oral BID Renford Dills, Amrit, MD   30 mL at 10/17/18 1011   . fluconazole (DIFLUCAN) tablet 100 mg  100 mg Oral Daily Len Childs T, RPH   100 mg at 10/17/18 1011  . furosemide (LASIX) injection 40 mg  40 mg Intravenous Once Antony Madura, PA-C      . furosemide (LASIX) injection 40 mg  40 mg Intravenous BID Lorretta Harp, MD   40 mg at 10/17/18 0956  . insulin aspart (novoLOG) injection 0-5 Units  0-5 Units Subcutaneous QHS Lorretta Harp, MD      . insulin aspart (novoLOG) injection 0-9 Units  0-9 Units Subcutaneous TID WC Lorretta Harp, MD   1 Units at 10/16/18 1714  . loperamide (IMODIUM) capsule 2 mg  2 mg Oral QID PRN Lorretta Harp, MD   2 mg at 10/17/18 1010  . MEDLINE mouth rinse  15 mL Mouth Rinse BID Delano Metz, MD   15 mL at 10/17/18 0957  . metoprolol succinate (TOPROL-XL) 24 hr tablet 50 mg  50 mg Oral Daily Lorretta Harp, MD   50 mg at 10/17/18 0956  . multivitamin with minerals tablet 1 tablet  1 tablet Oral Daily Burnadette Pop, MD   1 tablet at 10/17/18 1011  . ondansetron (ZOFRAN) injection 4 mg  4 mg Intravenous Q6H PRN Lorretta Harp, MD      . sodium chloride flush (NS) 0.9 % injection 3 mL  3 mL Intravenous Q12H Lorretta Harp, MD   3 mL at 10/17/18 0957  . sodium chloride flush (NS) 0.9 % injection 3 mL  3 mL Intravenous PRN Lorretta Harp, MD      . vitamin B-12 (CYANOCOBALAMIN) tablet 1,000 mcg  1,000 mcg Oral Daily Lorretta Harp, MD   1,000 mcg at 10/17/18 7903     Discharge Medications: Please see discharge summary for a list of discharge medications.  Relevant Imaging Results:  Relevant Lab Results:   Additional Information SSN [id number]  Armanda Heritage, RN

## 2018-10-17 NOTE — Evaluation (Signed)
Occupational Therapy Evaluation Patient Details Name: Kaitlyn [name] MRN: [id number] DOB: 1950/10/21 Today's Date: 10/17/2018    History of Present Illness 68 yo female admitted with acute resp failure, CHF exac, bil LE edema. Hx of A fib, CAD, chronic diarrhea, obesity, medical noncompliance, multiple ED visits.    Clinical Impression   Pt was admitted for the above. She lives alone and was mod I up until a couple of weeks ago.  Neighbor assisted and she called EMS for assistance with transfers 4x's.  She functions from a rolling chair and does lateral/scoot pivot transfers.  She reports that the height discrepancy between surfaces was giving her difficulty. She was able to stand with someone there. Anticipate she will need SNF unless she can return to PLOF.  Will follow in acute with min guard to supervision level goals.  Pt only performed bed level eval on this date; she sat EOB yesterday with PT, but has not performed transfers at this time. She needed total A for hygiene and mod for LB adls.  She would benefit from AE for energy conservation    Follow Up Recommendations  SNF    Equipment Recommendations  3 in 1 bedside commode(drop arm)    Recommendations for Other Services       Precautions / Restrictions Precautions Precautions: Fall Restrictions Weight Bearing Restrictions: No      Mobility Bed Mobility     Rolling: Min guard;Min assist         General bed mobility comments: min to roll completely  Transfers                 General transfer comment: declined OOB today    Balance                                           ADL either performed or assessed with clinical judgement   ADL Overall ADL's : Needs assistance/impaired Eating/Feeding: Independent   Grooming: Set up   Upper Body Bathing: Set up   Lower Body Bathing: Moderate assistance;Bed level   Upper Body Dressing : Minimal assistance   Lower Body Dressing: Moderate  assistance;Bed level       Toileting- Clothing Manipulation and Hygiene: Total assistance;Bed level         General ADL Comments: all adls assessed from bed level.  Pt had incontinence x bowel and needed to be cleaned up. Rolls well with rails (min A) but she sleeps in a flat bed at home. Pt is able to cross legs, but must use arm to maintain crossed position. She does not have any AE at home     Vision         Perception     Praxis      Pertinent Vitals/Pain Pain Assessment: No/denies pain     Hand Dominance     Extremity/Trunk Assessment Upper Extremity Assessment Upper Extremity Assessment: Generalized weakness(grossly 4/5)   Lower Extremity Assessment Lower Extremity Assessment: (states L knee gives on her)       Communication Communication Communication: No difficulties   Cognition Arousal/Alertness: Awake/alert Behavior During Therapy: WFL for tasks assessed/performed                                   General Comments: acknowledges that she needs to be able to  transfer, but not willing to try this session.  ? if she has time to request bed pan. She was incontinent x bowel.  Requested +2 assist; she can do more than she gives herself credit for, at bed level   General Comments  HR up to 144 during rolling.  Sats 88-90 on RA    Exercises     Shoulder Instructions      Home Living Family/patient expects to be discharged to:: Private residence Living Arrangements: Alone Available Help at Discharge: Friend(s) Type of Home: Apartment             Bathroom Shower/Tub: Chief Strategy Officer: Standard     Home Equipment: Environmental consultant - 2 wheels;Wheelchair - manual          Prior Functioning/Environment          Comments: needed help just prior to admission.  Called EMS 4x's and neighbor came to assist. Prior to this, pt performed SQPT to rolling  chair and regular commode.  Height discrepancy made it difficult for her. She  sleeps in a normal bed and has groceries delivered and brought in.         OT Problem List: Decreased strength;Decreased activity tolerance;Impaired balance (sitting and/or standing);Decreased safety awareness;Decreased knowledge of use of DME or AE;Cardiopulmonary status limiting activity      OT Treatment/Interventions: Self-care/ADL training;DME and/or AE instruction;Therapeutic activities;Balance training;Patient/family education;Therapeutic exercise;Energy conservation    OT Goals(Current goals can be found in the care plan section) Acute Rehab OT Goals Patient Stated Goal: home! OT Goal Formulation: With patient Time For Goal Achievement: 10/31/18 Potential to Achieve Goals: Good ADL Goals Pt Will Perform Lower Body Bathing: with supervision;sitting/lateral leans;with adaptive equipment Pt Will Perform Lower Body Dressing: with min assist;with adaptive equipment;sitting/lateral leans Pt Will Transfer to Toilet: with min guard assist;bedside commode(lateral scoot; drop arm) Pt Will Perform Toileting - Clothing Manipulation and hygiene: with min guard assist;sitting/lateral leans Additional ADL Goal #1: bed will get in/out of standard bed with supervision in preparation for toilet transfers  OT Frequency: Min 2X/week   Barriers to D/C:            Co-evaluation              AM-PAC OT "6 Clicks" Daily Activity     Outcome Measure Help from another person eating meals?: None Help from another person taking care of personal grooming?: A Little Help from another person toileting, which includes using toliet, bedpan, or urinal?: A Lot Help from another person bathing (including washing, rinsing, drying)?: A Lot Help from another person to put on and taking off regular upper body clothing?: A Little Help from another person to put on and taking off regular lower body clothing?: A Lot 6 Click Score: 16   End of Session    Activity Tolerance: Patient tolerated treatment  well Patient left: in bed;with call bell/phone within reach;with bed alarm set  OT Visit Diagnosis: Muscle weakness (generalized) (M62.81)                Time: 7517-0017 OT Time Calculation (min): 29 min Charges:  OT General Charges $OT Visit: 1 Visit OT Evaluation $OT Eval Moderate Complexity: 1 Mod OT Treatments $Self Care/Home Management : 8-22 mins  Marica Otter, OTR/L Acute Rehabilitation Services [phone] [facility] pager [phone] office 10/17/2018  Talicia Sui 10/17/2018, 9:37 AM

## 2018-10-17 NOTE — Progress Notes (Signed)
PHARMACIST - PHYSICIAN COMMUNICATION  CONCERNING: Antibiotic IV to Oral Route Change Policy  RECOMMENDATION: This patient is receiving fluconazole by the intravenous route.  Based on criteria approved by the Pharmacy and Therapeutics Committee, the antibiotic(s) is/are being converted to the equivalent oral dose form(s).   DESCRIPTION: These criteria include:  Patient being treated for a respiratory tract infection, urinary tract infection, cellulitis or clostridium difficile associated diarrhea if on metronidazole  The patient is not neutropenic and does not exhibit a GI malabsorption state  The patient is eating (either orally or via tube) and/or has been taking other orally administered medications for a least 24 hours  The patient is improving clinically and has a Tmax < 100.5  If you have questions about this conversion, please contact the Pharmacy Department  []  ( [phone] )  [place] []  ( [phone] )  [facility] []  ( [phone] )  [place] []  ( [phone] )  [facility] [x]  ( [phone] )  [facility]  

## 2018-10-17 NOTE — Progress Notes (Signed)
PROGRESS NOTE    Kaitlyn [name]  ZOX:[id number] DOB: 12/28/1950 DOA: 10/16/2018 PCP: Laurey MoraleMcLean, Dalton S, MD   Brief Narrative: Patient is a 68 year old female with history of systolic congestive heart failure with ejection fraction of 25 to 30%, hypertension, diabetes, coronary disease, atrial fibrillation on Eliquis, medication noncompliance, chronic diarrhea who presented with generalized weakness.  Patient was found to be in significant fluid overload and started on IV Lasix.  She was significantly hypoxic on presentation but her respiratory status has significantly improved today.  Assessment & Plan:   Principal Problem:   Acute on chronic systolic CHF (congestive heart failure) (HCC) Active Problems:   Macrocytosis   Essential hypertension   Diabetes mellitus without complication (HCC)   Atrial fibrillation with rapid ventricular response (HCC)   Chronic diarrhea   Thrombocytopenia (HCC)   CAD (coronary artery disease)   Acute respiratory failure with hypoxia (HCC)   Generalized weakness  Acute respiratory failure with hypoxia: Secondary to pulmonary edema secondary to chronic systolic heart failure. Chest x-ray showed vascular cephalization, consistent with CHF exacerbation. Respiratory status has improved today.  Currently on room air.  Not on oxygen at home.  Acute on chronic systolic CHF: BNP of 1511 on presentation.  Positive JVD on presentation.  Echo on 03/10/2018 had shown ejection fraction of 25 to 30%.  Very noncompliant .She does not take diuretics at home.  She states she does not want to take Lasix because it makes her pee very often.Continue metoprolol.On  Losartan at home. She was following with Dr. Shirlee LatchMcLean and Dr. Gala RomneyBensimhon, heart failure team before but has [facility] due to persistent noncompliance.  She was seen by Dr. Joaquim NamPatwardan during last hospitalization.  Persistent A. fib: Currently heart rate is controlled.CHA2DS2-VASc Score is 5.  Was  supposed to be on Eliquis at home but apparently has not been taking.  Presented with RVR in the emergency department.  Heart rate this morning was stable.  Continue digoxin, metoprolol  Hypertension: Continue PRN meds.  Continue metoprolol and Cozaar.  Currently blood pressure stable  Diabetes mellitus: Last hemoglobin A1c 6.4 on 03/08/2018.  Not taking metformin at home.  Continue sliding scale insulin.  Chronic diarrhea: Chronic intermittent diarrhea.  Denies any abdominal pain.  Continue home Imodium  Thrombocytopenia: Chronic.  Etiology unclear.  Continue to monitor  Suspected fungal rash on the groins: Continue fluconazole for now  Generalized weakness: Multifactorial.  Patient has been recommended skilled nursing facility and home health several times in the last admissions but see persistently denies.  She was seen by physical therapy here and recommended skilled nursing facility which she strictly denies and wants to go home.  She also denies home health.  I have requested for social worker and case manager evaluation and recommendation.          DVT prophylaxis:Eliquis Code Status: Full Family Communication: None present at the bedside Disposition Plan: Likely home tomorrow   Consultants: None  Procedures: Echocardiogram  Antimicrobials:  Anti-infectives (From admission, onward)   Start     Dose/Rate Route Frequency Ordered Stop   10/16/18 1700  fluconazole (DIFLUCAN) IVPB 100 mg     100 mg 50 mL/hr over 60 Minutes Intravenous Every 24 hours 10/16/18 1551        Subjective: Patient seen and examined the bedside this morning.  Hemodynamically stable.  Saturating fine on room air.  Denies any shortness of breath or cough.  Strictly denies Lasix.  States she wants to go  home, refuses home health order skilled nursing facility idea.  Discussed with the patient at the bedside and told her that she needs to be monitored 1 more day.  Objective: Vitals:   10/17/18 0200  10/17/18 0202 10/17/18 0605 10/17/18 0800  BP:  125/75 (!) 143/96   Pulse: 97 95 94   Resp: (!) 21 (!) 21 (!) 23   Temp:  97.8 F (36.6 C) 97.6 F (36.4 C) (!) 97.4 F (36.3 C)  TempSrc:  Oral Oral Oral  SpO2: 100% 94% 92%   Weight:      Height:        Intake/Output Summary (Last 24 hours) at 10/17/2018 0915 Last data filed at 10/17/2018 0600 Gross per 24 hour  Intake 50 ml  Output 2100 ml  Net -2050 ml   Filed Weights   10/16/18 0900  Weight: 118.5 kg    Examination:  General exam: obese HEENT:PERRL,Oral mucosa moist, Ear/Nose normal on gross exam Respiratory system: Bilateral decreased air entry on the bases, basal crackles Cardiovascular system:Afib, No JVD, murmurs, rubs, gallops or clicks. 1-2+ pedal edema. Gastrointestinal system: Abdomen is nondistended, soft and nontender. No organomegaly or masses felt. Normal bowel sounds heard. Central nervous system: Alert and oriented. No focal neurological deficits. Extremities: 1-2+ pedal edema, no clubbing ,no cyanosis, distal peripheral pulses palpable. Skin: Minor skin breakdowns  Data Reviewed: I have personally reviewed following labs and imaging studies  CBC: Recent Labs  Lab 10/16/18 0234 10/17/18 0305  WBC 7.4 8.8  NEUTROABS 5.5  --   HGB 11.7* 12.0  HCT 38.8 41.1  MCV 105.1* 107.3*  PLT 117* 117*   Basic Metabolic Panel: Recent Labs  Lab 10/16/18 0234 10/17/18 0305  NA 137 137  K 3.8 4.1  CL 110 106  CO2 21* 23  GLUCOSE 110* 98  BUN 13 17  CREATININE 0.99 1.16*  CALCIUM 7.9* 7.8*  MG 1.9  --    GFR: Estimated Creatinine Clearance: 65.9 mL/min (A) (by C-G formula based on SCr of 1.16 mg/dL (H)). Liver Function Tests: Recent Labs  Lab 10/16/18 0234  AST 10*  ALT 8  ALKPHOS 64  BILITOT 1.1  PROT 5.9*  ALBUMIN 3.1*   No results for input(s): LIPASE, AMYLASE in the last 168 hours. No results for input(s): AMMONIA in the last 168 hours. Coagulation Profile: No results for input(s): INR,  PROTIME in the last 168 hours. Cardiac Enzymes: Recent Labs  Lab 10/16/18 0234 10/16/18 0847 10/16/18 1214 10/16/18 1809  TROPONINI 0.04* 0.05* 0.05* 0.05*   BNP (last 3 results) No results for input(s): PROBNP in the last 8760 hours. HbA1C: Recent Labs    10/17/18 0305  HGBA1C 6.0*   CBG: Recent Labs  Lab 10/16/18 1259 10/16/18 1606 10/16/18 2140 10/17/18 0743 10/17/18 0818  GLUCAP 108* 139* 111* 67* 82   Lipid Profile: Recent Labs    10/17/18 0305  CHOL 92  HDL 35*  LDLCALC 45  TRIG 59  CHOLHDL 2.6   Thyroid Function Tests: Recent Labs    10/17/18 0305  TSH 2.409   Anemia Panel: No results for input(s): VITAMINB12, FOLATE, FERRITIN, TIBC, IRON, RETICCTPCT in the last 72 hours. Sepsis Labs: No results for input(s): PROCALCITON, LATICACIDVEN in the last 168 hours.  Recent Results (from the past 240 hour(s))  SARS Coronavirus 2 (CEPHEID - Performed in [facility] hospital lab), Hosp Order     Status: None   Collection Time: 10/16/18  2:34 AM  Result Value Ref Range Status  SARS Coronavirus 2 NEGATIVE NEGATIVE Final    Comment: (NOTE) If result is NEGATIVE SARS-CoV-2 target nucleic acids are NOT DETECTED. The SARS-CoV-2 RNA is generally detectable in upper and lower  respiratory specimens during the acute phase of infection. The lowest  concentration of SARS-CoV-2 viral copies this assay can detect is 250  copies / mL. A negative result does not preclude SARS-CoV-2 infection  and should not be used as the sole basis for treatment or other  patient management decisions.  A negative result may occur with  improper specimen collection / handling, submission of specimen other  than nasopharyngeal swab, presence of viral mutation(s) within the  areas targeted by this assay, and inadequate number of viral copies  (<250 copies / mL). A negative result must be combined with clinical  observations, patient history, and epidemiological information. If result  is POSITIVE SARS-CoV-2 target nucleic acids are DETECTED. The SARS-CoV-2 RNA is generally detectable in upper and lower  respiratory specimens dur ing the acute phase of infection.  Positive  results are indicative of active infection with SARS-CoV-2.  Clinical  correlation with patient history and other diagnostic information is  necessary to determine patient infection status.  Positive results do  not rule out bacterial infection or co-infection with other viruses. If result is PRESUMPTIVE POSTIVE SARS-CoV-2 nucleic acids MAY BE PRESENT.   A presumptive positive result was obtained on the submitted specimen  and confirmed on repeat testing.  While 2019 novel coronavirus  (SARS-CoV-2) nucleic acids may be present in the submitted sample  additional confirmatory testing may be necessary for epidemiological  and / or clinical management purposes  to differentiate between  SARS-CoV-2 and other Sarbecovirus currently known to infect humans.  If clinically indicated additional testing with an alternate test  methodology [phone]) is advised. The SARS-CoV-2 RNA is generally  detectable in upper and lower respiratory sp ecimens during the acute  phase of infection. The expected result is Negative. Fact Sheet for Patients:  BoilerBrush.com.cy Fact Sheet for Healthcare Providers: https://pope.com/ This test is not yet approved or cleared by the [place] FDA and has been authorized for detection and/or diagnosis of SARS-CoV-2 by FDA under an Emergency Use Authorization (EUA).  This EUA will remain in effect (meaning this test can be used) for the duration of the COVID-19 declaration under Section 564(b)(1) of the Act, 21 U.S.C. section 360bbb-3(b)(1), unless the authorization is terminated or revoked sooner. Performed at [facility], [address] [place], [place] [address]   MRSA PCR Screening     Status: None    Collection Time: 10/16/18  9:53 AM  Result Value Ref Range Status   MRSA by PCR NEGATIVE NEGATIVE Final    Comment:        The GeneXpert MRSA Assay (FDA approved for NASAL specimens only), is one component of a comprehensive MRSA colonization surveillance program. It is not intended to diagnose MRSA infection nor to guide or monitor treatment for MRSA infections. Performed at [facility], [address], [place], [place] [address]          Radiology Studies: Dg Chest Port 1 View  Result Date: 10/16/2018 CLINICAL DATA:  68 year old female with hypoxia and weakness. EXAM: PORTABLE CHEST 1 VIEW COMPARISON:  Chest radiograph dated 03/08/2018 FINDINGS: Stable cardiomegaly. Prominent hilar vasculature may represent pulmonary hypertension. No venous congestion or edema. Probable bibasilar atelectatic changes. No focal consolidation, pleural effusion, or pneumothorax. No acute osseous pathology. IMPRESSION: Cardiomegaly with possible pulmonary  hypertension. No focal consolidation. Electronically Signed   By: Elgie Collard M.D.   On: 10/16/2018 03:00        Scheduled Meds: . apixaban  5 mg Oral BID  . Chlorhexidine Gluconate Cloth  6 each Topical Q0600  . digoxin  0.125 mg Oral Daily  . furosemide  40 mg Intravenous Once  . furosemide  40 mg Intravenous BID  . insulin aspart  0-5 Units Subcutaneous QHS  . insulin aspart  0-9 Units Subcutaneous TID WC  . mouth rinse  15 mL Mouth Rinse BID  . metoprolol succinate  50 mg Oral Daily  . sodium chloride flush  3 mL Intravenous Q12H  . vitamin B-12  1,000 mcg Oral Daily   Continuous Infusions: . sodium chloride    . fluconazole (DIFLUCAN) IV Stopped (10/16/18 1818)     LOS: 1 day    Time spent: 35 mins.More than 50% of that time was spent in counseling and/or coordination of care.      Burnadette Pop, MD [facility] Pager [phone]  If 7PM-7AM, please contact night-coverage [email or web address]  Password [email or web address] 10/17/2018, 9:15 AM

## 2018-10-17 NOTE — Progress Notes (Signed)
Report received from J.Ottis Stain, Charity fundraiser.  Assessment unchanged. Kaitlyn [name]

## 2018-10-18 LAB — GLUCOSE, CAPILLARY
Glucose-Capillary: 88 mg/dL (ref 70–99)
Glucose-Capillary: 125 mg/dL — ABNORMAL HIGH (ref 70–99)
Glucose-Capillary: 114 mg/dL — ABNORMAL HIGH (ref 70–99)
Glucose-Capillary: 115 mg/dL — ABNORMAL HIGH (ref 70–99)

## 2018-10-18 LAB — BASIC METABOLIC PANEL
Anion gap: 6 (ref 5–15)
BUN: 20 mg/dL (ref 8–23)
CO2: 24 mmol/L (ref 22–32)
Calcium: 7.7 mg/dL — ABNORMAL LOW (ref 8.9–10.3)
Chloride: 105 mmol/L (ref 98–111)
Creatinine, Ser: 1.15 mg/dL — ABNORMAL HIGH (ref 0.44–1.00)
GFR calc Af Amer: 57 mL/min — ABNORMAL LOW (ref >60)
GFR calc non Af Amer: 49 mL/min — ABNORMAL LOW (ref >60)
Glucose, Bld: 90 mg/dL (ref 70–99)
Potassium: 3.8 mmol/L (ref 3.5–5.1)
Sodium: 135 mmol/L (ref 135–145)

## 2018-10-18 NOTE — TOC Progression Note (Signed)
Transition of Care [facility]) - Progression Note    Patient Details  Name: Kaitlyn [name] MRN: [id number] Date of Birth: [birth year]  [facility] CM/SW Contact  Ulah Olmo, Olegario Messier, RN Phone Number: 10/18/2018, 2:47 PM  Clinical Narrative: Provided patient w/bed [name], [facility]-await patient's choice. MD updated.      Expected Discharge Plan: Skilled Nursing Facility Barriers to Discharge: SNF Pending bed offer  Expected Discharge Plan and Services Expected Discharge Plan: Skilled Nursing Facility       Living arrangements for the past 2 months: Apartment                                       Social Determinants of Health (SDOH) Interventions    Readmission Risk Interventions Readmission Risk Prevention Plan 10/17/2018  Transportation Screening Complete  PCP or Specialist Appt within 3-5 Days Not Complete  Not Complete comments plan to d/c to SNF  HRI or Home Care Consult Complete  Social Work Consult for Recovery Care Planning/Counseling Complete  Palliative Care Screening Not Applicable  Medication Review Oceanographer) Complete  Some recent data might be hidden

## 2018-10-18 NOTE — Progress Notes (Signed)
Patient refused all medications today.  Patient refused to attempt to use bedpan for voiding today.  Attempted to educate patient several times gently and politely regarding patient care, patient medications, heart failure and treatment of condition however patient continues to resist education. Patient to select SNF and discharge tomorrow per MD and Case Manager.  Will continue to monitor.

## 2018-10-18 NOTE — Discharge Instructions (Signed)

## 2018-10-18 NOTE — Progress Notes (Signed)
Patient refusing to take any medications this morning.  States "I have them at home. I do not want to take those medications.  They don't work.  I am tired of being sick.  If I take any medications I will have diarrhea. I want to go home." Attempted to educate patient regarding heart failure, her hospital admission, and medications scheduled this morning, but patient refusing teaching.  Needs reinforcement.  Dr. Renford Dills notified.  Will continue to monitor.

## 2018-10-18 NOTE — Progress Notes (Signed)
PROGRESS NOTE    Kaitlyn [name]  ZOX:[id number] DOB: 1950/11/09 DOA: 10/16/2018 PCP: Laurey Morale, MD   Brief Narrative: Patient is a 68 year old female with history of systolic congestive heart failure with ejection fraction of 25 to 30%, hypertension, diabetes, coronary disease, atrial fibrillation on Eliquis, medication noncompliance, chronic diarrhea who presented with generalized weakness.  Patient was found to be in significant fluid overload and started on IV Lasix.  She was significantly hypoxic on presentation but her respiratory status has  improved now.She has long history of noncompliance, currently refusing to take Lasix.Plan is to discharge her to SNF.  Assessment & Plan:   Principal Problem:   Acute on chronic systolic CHF (congestive heart failure) (HCC) Active Problems:   Macrocytosis   Essential hypertension   Diabetes mellitus without complication (HCC)   Atrial fibrillation with rapid ventricular response (HCC)   Chronic diarrhea   Thrombocytopenia (HCC)   CAD (coronary artery disease)   Acute respiratory failure with hypoxia (HCC)   Generalized weakness  Acute respiratory failure with hypoxia: Secondary to pulmonary edema secondary to chronic systolic heart failure. Chest x-ray showed vascular cephalization, consistent with CHF exacerbation. Respiratory status has improved .  Currently on 2 L oxygen via nasal cannula.  Not on oxygen at home.  Acute on chronic systolic CHF: BNP of 1511 on presentation.  Positive JVD on presentation.  Echo on 03/10/2018 had shown ejection fraction of 25 to 30%.  Very noncompliant .She does not take diuretics at home.  She states she does not want to take Lasix because it makes her pee very often.Continue metoprolol.On  Losartan at home. She was following with Dr. Shirlee Latch and Dr. Gala Romney, heart failure team before but has been dismissed from [facility] due to persistent noncompliance.  She was seen by Dr. Joaquim Nam during last  hospitalization.  Persistent A. fib: Currently heart rate is controlled.CHA2DS2-VASc Score is 5.  Was supposed to be on Eliquis at home but apparently has not been taking.  Presented with RVR in the emergency department.  Heart rate this morning was stable.  Continue digoxin, metoprolol  Hypertension: Continue PRN meds.  Continue metoprolol and Cozaar.  Currently blood pressure stable  Diabetes mellitus: Last hemoglobin A1c 6.4 on 03/08/2018.  Not taking metformin at home.  Continue sliding scale insulin.  Chronic diarrhea: Chronic intermittent diarrhea.  Denies any abdominal pain.  Continue home Imodium  Thrombocytopenia: Chronic.  Etiology unclear.  Continue to monitor  Suspected fungal rash on the groins: Continue fluconazole for now  Generalized weakness: Multifactorial.  Patient has been recommended skilled nursing facility and home health several times in the last admissions but see persistently denies.  She was seen by physical therapy here and recommended skilled nursing facility .  Nutrition Problem: Increased nutrient needs Etiology: chronic illness(CHF)      DVT prophylaxis:Eliquis Code Status: Full Family Communication: None present at the bedside Disposition Plan: SNF tomorrow.Patient still hasnot decided which one to choose   Consultants: None  Procedures: Echocardiogram  Antimicrobials:  Anti-infectives (From admission, onward)   Start     Dose/Rate Route Frequency Ordered Stop   10/17/18 1000  fluconazole (DIFLUCAN) tablet 100 mg     100 mg Oral Daily 10/17/18 0937     10/16/18 1700  fluconazole (DIFLUCAN) IVPB 100 mg  Status:  Discontinued     100 mg 50 mL/hr over 60 Minutes Intravenous Every 24 hours 10/16/18 1551 10/17/18 0937      Subjective: Patient seen and examined at  bedside this morning.  Continues to deny taking Lasix.  She is  still thinking about skilled nursing facility decision.  Objective: Vitals:   10/17/18 1501 10/17/18 2156 10/17/18  2159 10/18/18 0436  BP: (!) 125/91 111/75  112/75  Pulse: 94 94  90  Resp: 19 18  20   Temp: 97.6 F (36.4 C) 98.5 F (36.9 C)  98.5 F (36.9 C)  TempSrc: Oral Oral  Oral  SpO2: 92% 91% 95% 97%  Weight:    115.7 kg  Height:        Intake/Output Summary (Last 24 hours) at 10/18/2018 1450 Last data filed at 10/18/2018 0500 Gross per 24 hour  Intake 603 ml  Output 2200 ml  Net -1597 ml   Filed Weights   10/16/18 0900 10/18/18 0436  Weight: 118.5 kg 115.7 kg    Examination:  = General exam: Not in distress,obese HEENT:PERRL,Oral mucosa moist, Ear/Nose normal on gross exam Respiratory system: Bilateral decreased air entry on the bases Cardiovascular system: S1 & S2 heard, RRR. No JVD, murmurs, rubs, gallops or clicks.1-2 + pedal pitting edema Gastrointestinal system: Abdomen is nondistended, soft and nontender. No organomegaly or masses felt. Normal bowel sounds heard. Central nervous system: Alert and oriented. No focal neurological deficits. Extremities: 1-2 + pedal edema, no clubbing ,no cyanosis, distal peripheral pulses palpable. Skin: No rashes, lesions or ulcers,no icterus ,no pallor    Data Reviewed: I have personally reviewed following labs and imaging studies  CBC: Recent Labs  Lab 10/16/18 0234 10/17/18 0305  WBC 7.4 8.8  NEUTROABS 5.5  --   HGB 11.7* 12.0  HCT 38.8 41.1  MCV 105.1* 107.3*  PLT 117* 117*   Basic Metabolic Panel: Recent Labs  Lab 10/16/18 0234 10/17/18 0305 10/18/18 0605  NA 137 137 135  K 3.8 4.1 3.8  CL 110 106 105  CO2 21* 23 24  GLUCOSE 110* 98 90  BUN 13 17 20   CREATININE 0.99 1.16* 1.15*  CALCIUM 7.9* 7.8* 7.7*  MG 1.9  --   --    GFR: Estimated Creatinine Clearance: 65.6 mL/min (A) (by C-G formula based on SCr of 1.15 mg/dL (H)). Liver Function Tests: Recent Labs  Lab 10/16/18 0234  AST 10*  ALT 8  ALKPHOS 64  BILITOT 1.1  PROT 5.9*  ALBUMIN 3.1*   No results for input(s): LIPASE, AMYLASE in the last 168 hours.  No results for input(s): AMMONIA in the last 168 hours. Coagulation Profile: No results for input(s): INR, PROTIME in the last 168 hours. Cardiac Enzymes: Recent Labs  Lab 10/16/18 0234 10/16/18 0847 10/16/18 1214 10/16/18 1809  TROPONINI 0.04* 0.05* 0.05* 0.05*   BNP (last 3 results) No results for input(s): PROBNP in the last 8760 hours. HbA1C: Recent Labs    10/17/18 0305  HGBA1C 6.0*   CBG: Recent Labs  Lab 10/17/18 1119 10/17/18 1654 10/17/18 2157 10/18/18 0728 10/18/18 1142  GLUCAP 101* 127* 71 88 125*   Lipid Profile: Recent Labs    10/17/18 0305  CHOL 92  HDL 35*  LDLCALC 45  TRIG 59  CHOLHDL 2.6   Thyroid Function Tests: Recent Labs    10/17/18 0305  TSH 2.409   Anemia Panel: No results for input(s): VITAMINB12, FOLATE, FERRITIN, TIBC, IRON, RETICCTPCT in the last 72 hours. Sepsis Labs: No results for input(s): PROCALCITON, LATICACIDVEN in the last 168 hours.  Recent Results (from the past 240 hour(s))  SARS Coronavirus 2 (CEPHEID - Performed in [facility] hospital lab), [facility]  Status: None   Collection Time: 10/16/18  2:34 AM  Result Value Ref Range Status   SARS Coronavirus 2 NEGATIVE NEGATIVE Final    Comment: (NOTE) If result is NEGATIVE SARS-CoV-2 target nucleic acids are NOT DETECTED. The SARS-CoV-2 RNA is generally detectable in upper and lower  respiratory specimens during the acute phase of infection. The lowest  concentration of SARS-CoV-2 viral copies this assay can detect is 250  copies / mL. A negative result does not preclude SARS-CoV-2 infection  and should not be used as the sole basis for treatment or other  patient management decisions.  A negative result may occur with  improper specimen collection / handling, submission of specimen other  than nasopharyngeal swab, presence of viral mutation(s) within the  areas targeted by this assay, and inadequate number of viral copies  (<250 copies / mL). A negative result  must be combined with clinical  observations, patient history, and epidemiological information. If result is POSITIVE SARS-CoV-2 target nucleic acids are DETECTED. The SARS-CoV-2 RNA is generally detectable in upper and lower  respiratory specimens dur ing the acute phase of infection.  Positive  results are indicative of active infection with SARS-CoV-2.  Clinical  correlation with patient history and other diagnostic information is  necessary to determine patient infection status.  Positive results do  not rule out bacterial infection or co-infection with other viruses. If result is PRESUMPTIVE POSTIVE SARS-CoV-2 nucleic acids MAY BE PRESENT.   A presumptive positive result was obtained on the submitted specimen  and confirmed on repeat testing.  While 2019 novel coronavirus  (SARS-CoV-2) nucleic acids may be present in the submitted sample  additional confirmatory testing may be necessary for epidemiological  and / or clinical management purposes  to differentiate between  SARS-CoV-2 and other Sarbecovirus currently known to infect humans.  If clinically indicated additional testing with an alternate test  methodology [id number]) is advised. The SARS-CoV-2 RNA is generally  detectable in upper and lower respiratory sp ecimens during the acute  phase of infection. The expected result is Negative. Fact Sheet for Patients:  BoilerBrush.com.cy Fact Sheet for Healthcare Providers: https://pope.com/ This test is not yet approved or cleared by the [place] FDA and has been authorized for detection and/or diagnosis of SARS-CoV-2 by FDA under an Emergency Use Authorization (EUA).  This EUA will remain in effect (meaning this test can be used) for the duration of the COVID-19 declaration under Section 564(b)(1) of the Act, 21 U.S.C. section 360bbb-3(b)(1), unless the authorization is terminated or revoked sooner. Performed at [facility], [address] [place], [place] [address]   MRSA PCR Screening     Status: None   Collection Time: 10/16/18  9:53 AM  Result Value Ref Range Status   MRSA by PCR NEGATIVE NEGATIVE Final    Comment:        The GeneXpert MRSA Assay (FDA approved for NASAL specimens only), is one component of a comprehensive MRSA colonization surveillance program. It is not intended to diagnose MRSA infection nor to guide or monitor treatment for MRSA infections. Performed at [facility], [address], [place], [place] [address]          Radiology Studies: No results found.      Scheduled Meds: . apixaban  5 mg Oral BID  . digoxin  0.125 mg Oral Daily  . feeding supplement (GLUCERNA SHAKE)  237 mL Oral BID BM  . feeding supplement (PRO-STAT SUGAR FREE 64)  30 mL Oral BID  . fluconazole  100 mg Oral Daily  . furosemide  40 mg Intravenous BID  . insulin aspart  0-5 Units Subcutaneous QHS  . insulin aspart  0-9 Units Subcutaneous TID WC  . mouth rinse  15 mL Mouth Rinse BID  . metoprolol succinate  50 mg Oral Daily  . multivitamin with minerals  1 tablet Oral Daily  . sodium chloride flush  3 mL Intravenous Q12H  . vitamin B-12  1,000 mcg Oral Daily   Continuous Infusions: . sodium chloride       LOS: 2 days    Time spent: 35 mins.More than 50% of that time was spent in counseling and/or coordination of care.      Burnadette Pop, MD [facility] Pager [phone]  If 7PM-7AM, please contact night-coverage [email or web address] Password [email or web address] 10/18/2018, 2:50 PM

## 2018-10-19 ENCOUNTER — Non-Acute Institutional Stay (SKILLED_NURSING_FACILITY): Payer: Medicare Other | Admitting: Internal Medicine

## 2018-10-19 ENCOUNTER — Encounter: Payer: Self-pay | Admitting: Internal Medicine

## 2018-10-19 DIAGNOSIS — E119 Type 2 diabetes mellitus without complications: Secondary | ICD-10-CM

## 2018-10-19 DIAGNOSIS — D696 Thrombocytopenia, unspecified: Secondary | ICD-10-CM | POA: Diagnosis not present

## 2018-10-19 DIAGNOSIS — I482 Chronic atrial fibrillation, unspecified: Secondary | ICD-10-CM | POA: Diagnosis not present

## 2018-10-19 DIAGNOSIS — R531 Weakness: Secondary | ICD-10-CM

## 2018-10-19 DIAGNOSIS — I5023 Acute on chronic systolic (congestive) heart failure: Secondary | ICD-10-CM

## 2018-10-19 LAB — BASIC METABOLIC PANEL
Anion gap: 6 (ref 5–15)
BUN: 18 mg/dL (ref 8–23)
CO2: 23 mmol/L (ref 22–32)
Calcium: 8 mg/dL — ABNORMAL LOW (ref 8.9–10.3)
Chloride: 108 mmol/L (ref 98–111)
Creatinine, Ser: 1.07 mg/dL — ABNORMAL HIGH (ref 0.44–1.00)
GFR calc Af Amer: >60 mL/min (ref >60)
GFR calc non Af Amer: 53 mL/min — ABNORMAL LOW (ref >60)
Glucose, Bld: 96 mg/dL (ref 70–99)
Potassium: 3.7 mmol/L (ref 3.5–5.1)
Sodium: 137 mmol/L (ref 135–145)

## 2018-10-19 LAB — GLUCOSE, CAPILLARY
Glucose-Capillary: 94 mg/dL (ref 70–99)
Glucose-Capillary: 113 mg/dL — ABNORMAL HIGH (ref 70–99)

## 2018-10-19 MED ORDER — FLUCONAZOLE 100 MG PO TABS: 100.0000 mg | ORAL_TABLET | Freq: Every day | ORAL | 0 refills | Status: AC

## 2018-10-19 MED ORDER — LOPERAMIDE HCL 1 MG/7.5ML PO SUSP
2.0000 mg | ORAL | Status: DC | PRN
  Administered 2018-10-19 (×2): 2 mg via ORAL
  Filled 2018-10-19 (×4): qty 15

## 2018-10-19 NOTE — TOC Transition Note (Signed)
Transition of Care [facility]) - CM/SW Discharge Note   Patient Details  Name: Kaitlyn [name] MRN: [id number] Date of Birth: 1950/10/13  Transition of Care [facility]) CM/SW Contact:  Lanier Clam, RN Phone Number: 10/19/2018, 10:23 AM   Clinical Narrative: Patient has accepted bed offer @ [facility]-rep Christa will call patient in rm directly about her questions. MD notified. Await bed rm number to call report.will fax d/c summary once ready.       Barriers to Discharge: SNF Pending discharge summary   Patient Goals and CMS Choice Patient states their goals for this hospitalization and ongoing recovery are:: wants to go to her apartment       Discharge Placement              Patient chooses bed at: [facility]) Patient to be transferred to facility by: Sharin Mons) Name of family member notified: (None) Patient and family notified of of transfer: 10/19/18  Discharge Plan and Services                                     Social Determinants of Health (SDOH) Interventions     Readmission Risk Interventions Readmission Risk Prevention Plan 10/17/2018  Transportation Screening Complete  PCP or Specialist Appt within 3-5 Days Not Complete  Not Complete comments plan to d/c to SNF  HRI or Home Care Consult Complete  Social Work Consult for Recovery Care Planning/Counseling Complete  Palliative Care Screening Not Applicable  Medication Review [employer]) Complete  Some recent data might be hidden
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
## 2018-10-19 NOTE — Progress Notes (Signed)
Patient IV removed. PTAR assisted patience onto stretcher.  All patient belongings given to patient. All vitals stable.  Attempted to call report to [facility] but got no answer; will try again later.

## 2018-10-19 NOTE — Progress Notes (Signed)
PT Cancellation Note  Patient Details Name: Kaitlyn [name] MRN: [id number] DOB: Sep 19, 1950   Cancelled Treatment:    Reason Eval/Treat Not Completed: Patient declined, no reason specified Attempted PT session, pt declined stated she has been given antidiarrhetic and fearful gait and exercise would cause bowel movement prior her transition.  Pt left in bed with call bell within reach and bed alarm set.  [address], LPTA; CBIS [phone]  Juel Burrow 10/19/2018, 1:44 PM

## 2018-10-19 NOTE — Care Management Important Message (Signed)
Important Message  Patient Details IM Letter given to Lanier Clam RN to present to the Patient Name: Kaitlyn [name] MRN: [id number] Date of Birth: 14-Dec-1950   Medicare Important Message Given:  Yes    Caren Macadam 10/19/2018, 10:56 AM

## 2018-10-19 NOTE — Discharge Summary (Signed)
Physician Discharge Summary  Kaitlyn [name] ZOX:[id number] DOB: 21-Jul-1950 DOA: 10/16/2018  PCP: Laurey Morale, MD  Admit date: 10/16/2018 Discharge date: 10/19/2018  Admitted From: Home Disposition:  Home  Discharge Condition:Stable CODE STATUS:FULL Diet recommendation: Heart Healthy   Brief/Interim Summary:  Patient is a 68 year old female with history of systolic congestive heart failure with ejection fraction of 25 to 30%, hypertension, diabetes, coronary disease, atrial fibrillation on Eliquis, medication noncompliance, chronic diarrhea who presented with generalized weakness.  Patient was found to be in significant fluid overload and started on IV Lasix.  She was significantly hypoxic on presentation but her respiratory status has improved now.She has long history of noncompliance. Physical therapy saw her and recommended skilled nursing facility on discharge.    Following problems were addressed during her hospitalization:  Acute respiratory failure with hypoxia: Secondary to pulmonary edema secondary to chronic systolic heart failure. Chest x-ray showed vascular cephalization, consistent with CHF exacerbation. Respiratory status has improved .On room air this morning.  Not on oxygen at home.  Acute on chronic systolic CHF: BNP of 1511 on presentation.  Positive JVD on presentation.  Echo on 03/10/2018 had shown ejection fraction of 25 to 30%.  Very noncompliant .She does not take diuretics at home.  She states she does not want to take Lasix because it makes her pee very often.Continue metoprolol.On  Losartan at home. She was following with Dr. Shirlee Latch and Dr. Gala Romney, heart failure team before but has been dismissed from [facility] heart care due to persistent noncompliance.  She was seen by Dr. Joaquim Nam during last hospitalization. Discussed with patient at the bedside this morning and she has agreed now to take her medications including Lasix.  Persistent A. fib: Currently heart  rate is controlled.CHA2DS2-VASc Scoreis 5.  Was supposed to be on Eliquis at home but apparently has not been taking.  Presented with RVR in the emergency department.  Heart rate this morning was stable.  Continue digoxin, metoprolol  Hypertension: Continue PRN meds.  Continue metoprolol and Cozaar.  Currently blood pressure stable  Diabetes mellitus: Last hemoglobin A1c 6.4 on 03/08/2018.  Not taking metformin at home.  Continue metformin on discharge.  Chronic diarrhea: Chronic intermittent diarrhea.  Denies any abdominal pain.  Continue home Imodium  Thrombocytopenia: Chronic.  Etiology unclear.  Continue to monitor  Suspected fungal rash on the groins: On  fluconazole for.  Generalized weakness: Multifactorial.   She was seen by physical therapy here and recommended skilled nursing facility .   Discharge Diagnoses:  Principal Problem:   Acute on chronic systolic CHF (congestive heart failure) (HCC) Active Problems:   Macrocytosis   Essential hypertension   Diabetes mellitus without complication (HCC)   Atrial fibrillation with rapid ventricular response (HCC)   Chronic diarrhea   Thrombocytopenia (HCC)   CAD (coronary artery disease)   Acute respiratory failure with hypoxia (HCC)   Generalized weakness    Discharge Instructions  Discharge Instructions    Diet - low sodium heart healthy   Complete by:  As directed    Discharge instructions   Complete by:  As directed    1)Please take your medications as instructed. 2)Follow up with your PCP/cardiologist as an outpatient in 2 weeks.   Increase activity slowly   Complete by:  As directed      Allergies as of 10/19/2018      Reactions   Caffeine Palpitations, Other (See Comments)   Seizure from large doses, heart races from small doses   Cranberry Shortness  Of Breath, Diarrhea, Itching, Other (See Comments)   Severe headache.."Ocean Spray cranberry juice"   Lisinopril Diarrhea, Itching, Swelling   Site of  swelling not recalled   Penicillins Other (See Comments)   Unknown childhood allergy Has patient had a PCN reaction causing immediate rash, facial/tongue/throat swelling, SOB or lightheadedness with hypotension: unknown Has patient had a PCN reaction causing severe rash involving mucus membranes or skin necrosis: unknown Has patient had a PCN reaction that required hospitalization unknown Has patient had a PCN reaction occurring within the last 10 years: unknown If all of the above answers are "NO", then may proceed with Cephalosporin use.   Other Other (See Comments)   Stimulants- patient has a past history of seizures      Medication List    STOP taking these medications   doxycycline 100 MG capsule Commonly known as:  VIBRAMYCIN     TAKE these medications   acetaminophen 325 MG tablet Commonly known as:  TYLENOL Take 2 tablets (650 mg total) by mouth every 4 (four) hours as needed for headache or mild pain.   apixaban 5 MG Tabs tablet Commonly known as:  ELIQUIS Take 1 tablet (5 mg total) by mouth 2 (two) times daily. What changed:  when to take this   digoxin 0.125 MG tablet Commonly known as:  LANOXIN Take 1 tablet (0.125 mg total) by mouth daily.   diphenoxylate-atropine 2.5-0.025 MG/5ML liquid Commonly known as:  LOMOTIL Take 10 mLs by mouth 4 (four) times daily as needed for diarrhea or loose stools.   fluconazole 100 MG tablet Commonly known as:  DIFLUCAN Take 1 tablet (100 mg total) by mouth daily for 7 days. Start taking on:  October 20, 2018   furosemide 40 MG tablet Commonly known as:  LASIX Take 1 tablet (40 mg total) by mouth daily.   loperamide HCl 1 MG/7.5ML suspension Commonly known as:  IMODIUM Take 15 mLs (2 mg total) by mouth as needed for diarrhea or loose stools (  after weach loose stool up to /day). What changed:  Another medication with the same name was removed. Continue taking this medication, and follow the directions you see here.    losartan 25 MG tablet Commonly known as:  COZAAR Take 1 tablet (25 mg total) by mouth daily.   metFORMIN 500 MG tablet Commonly known as:  Glucophage Take 1 tablet (500 mg total) by mouth 2 (two) times daily with a meal.   metoprolol succinate 50 MG 24 hr tablet Commonly known as:  TOPROL-XL Take 1 tablet (50 mg total) by mouth 2 (two) times daily. Take with or immediately following a meal. What changed:    when to take this  additional instructions   potassium chloride 20 MEQ packet Commonly known as:  KLOR-CON Take 20 mEq by mouth daily.   vitamin B-12 1000 MCG tablet Commonly known as:  CYANOCOBALAMIN Take 1 tablet (1,000 mcg total) by mouth daily.      Follow-up Information    Laurey Morale, MD. Schedule an appointment as soon as possible for a visit in 1 week(s).   Specialty:  Cardiology Contact information: [address] [place] [address] [phone]          Allergies  Allergen Reactions  . Caffeine Palpitations and Other (See Comments)    Seizure from large doses, heart races from small doses  . Cranberry Shortness Of Breath, Diarrhea, Itching and Other (See Comments)    Severe headache.."Ocean Spray cranberry juice"  .  Lisinopril Diarrhea, Itching and Swelling    Site of swelling not recalled  . Penicillins Other (See Comments)    Unknown childhood allergy Has patient had a PCN reaction causing immediate rash, facial/tongue/throat swelling, SOB or lightheadedness with hypotension: unknown Has patient had a PCN reaction causing severe rash involving mucus membranes or skin necrosis: unknown Has patient had a PCN reaction that required hospitalization unknown Has patient had a PCN reaction occurring within the last 10 years: unknown If all of the above answers are "NO", then may proceed with Cephalosporin use.   . Other Other (See Comments)    Stimulants- patient has a past history of seizures     Consultations:  None   Procedures/Studies: Dg Chest Port 1 View  Result Date: 10/16/2018 CLINICAL DATA:  68 year old female with hypoxia and weakness. EXAM: PORTABLE CHEST 1 VIEW COMPARISON:  Chest radiograph dated 03/08/2018 FINDINGS: Stable cardiomegaly. Prominent hilar vasculature may represent pulmonary hypertension. No venous congestion or edema. Probable bibasilar atelectatic changes. No focal consolidation, pleural effusion, or pneumothorax. No acute osseous pathology. IMPRESSION: Cardiomegaly with possible pulmonary hypertension. No focal consolidation. Electronically Signed   By: Elgie Collard M.D.   On: 10/16/2018 03:00       Subjective: Patient seen and examined bedside this morning.  Hemodynamically stable.  Appeared comfortable during my evaluation.  Denies any shortness of breath.  Has bilateral lower extremity edema.  Agrees now to take Lasix.  Discharge Exam: Vitals:   10/19/18 0408 10/19/18 0434  BP: 113/79   Pulse: 88   Resp: 20   Temp: 98.1 F (36.7 C)   SpO2: 90% 93%   Vitals:   10/19/18 0408 10/19/18 0408 10/19/18 0434 10/19/18 0500  BP: 113/79 113/79    Pulse: 88 88    Resp: 20 20    Temp: 98.1 F (36.7 C) 98.1 F (36.7 C)    TempSrc: Oral Oral    SpO2: 90% 90% 93%   Weight:    114.3 kg  Height:        General: Pt is alert, awake, not in acute distress Cardiovascular: RRR, S1/S2 +, no rubs, no gallops Respiratory: decrease air entry on the bases, no wheezing, no rhonchi Abdominal: Soft, NT, ND, bowel sounds + Extremities: 2-3 +edema on lower extremities, no cyanosis    The results of significant diagnostics from this hospitalization (including imaging, microbiology, ancillary and laboratory) are listed below for reference.     Microbiology: Recent Results (from the past 240 hour(s))  SARS Coronavirus 2 (CEPHEID - Performed in [facility] hospital lab), Hosp Order     Status: None   Collection Time: 10/16/18  2:34 AM  Result Value  Ref Range Status   SARS Coronavirus 2 NEGATIVE NEGATIVE Final    Comment: (NOTE) If result is NEGATIVE SARS-CoV-2 target nucleic acids are NOT DETECTED. The SARS-CoV-2 RNA is generally detectable in upper and lower  respiratory specimens during the acute phase of infection. The lowest  concentration of SARS-CoV-2 viral copies this assay can detect is 250  copies / mL. A negative result does not preclude SARS-CoV-2 infection  and should not be used as the sole basis for treatment or other  patient management decisions.  A negative result may occur with  improper specimen collection / handling, submission of specimen other  than nasopharyngeal swab, presence of viral mutation(s) within the  areas targeted by this assay, and inadequate number of viral copies  (<250 copies / mL). A negative result must be combined with  clinical  observations, patient history, and epidemiological information. If result is POSITIVE SARS-CoV-2 target nucleic acids are DETECTED. The SARS-CoV-2 RNA is generally detectable in upper and lower  respiratory specimens dur ing the acute phase of infection.  Positive  results are indicative of active infection with SARS-CoV-2.  Clinical  correlation with patient history and other diagnostic information is  necessary to determine patient infection status.  Positive results do  not rule out bacterial infection or co-infection with other viruses. If result is PRESUMPTIVE POSTIVE SARS-CoV-2 nucleic acids MAY BE PRESENT.   A presumptive positive result was obtained on the submitted specimen  and confirmed on repeat testing.  While 2019 novel coronavirus  (SARS-CoV-2) nucleic acids may be present in the submitted sample  additional confirmatory testing may be necessary for epidemiological  and / or clinical management purposes  to differentiate between  SARS-CoV-2 and other Sarbecovirus currently known to infect humans.  If clinically indicated additional testing with an  alternate test  methodology [phone]) is advised. The SARS-CoV-2 RNA is generally  detectable in upper and lower respiratory sp ecimens during the acute  phase of infection. The expected result is Negative. Fact Sheet for Patients:  BoilerBrush.com.cy Fact Sheet for Healthcare Providers: https://pope.com/ This test is not yet approved or cleared by the [place] FDA and has been authorized for detection and/or diagnosis of SARS-CoV-2 by FDA under an Emergency Use Authorization (EUA).  This EUA will remain in effect (meaning this test can be used) for the duration of the COVID-19 declaration under Section 564(b)(1) of the Act, 21 U.S.C. section 360bbb-3(b)(1), unless the authorization is terminated or revoked sooner. Performed at [facility], [address] [place], [place] [address]   MRSA PCR Screening     Status: None   Collection Time: 10/16/18  9:53 AM  Result Value Ref Range Status   MRSA by PCR NEGATIVE NEGATIVE Final    Comment:        The GeneXpert MRSA Assay (FDA approved for NASAL specimens only), is one component of a comprehensive MRSA colonization surveillance program. It is not intended to diagnose MRSA infection nor to guide or monitor treatment for MRSA infections. Performed at [facility], [address] [place], [place] [address]      Labs: BNP (last 3 results) Recent Labs    12/07/17 1955 03/08/18 1235 10/16/18 0234  BNP 486.8* 1,146.6* 1,551.7*   Basic Metabolic Panel: Recent Labs  Lab 10/16/18 0234 10/17/18 0305 10/18/18 0605 10/19/18 0600  NA 137 137 135 137  K 3.8 4.1 3.8 3.7  CL 110 106 105 108  CO2 21* 23 24 23   GLUCOSE 110* 98 90 96  BUN 13 17 20 18   CREATININE 0.99 1.16* 1.15* 1.07*  CALCIUM 7.9* 7.8* 7.7* 8.0*  MG 1.9  --   --   --    Liver Function Tests: Recent Labs  Lab 10/16/18 0234  AST 10*  ALT 8  ALKPHOS 64  BILITOT 1.1   PROT 5.9*  ALBUMIN 3.1*   No results for input(s): LIPASE, AMYLASE in the last 168 hours. No results for input(s): AMMONIA in the last 168 hours. CBC: Recent Labs  Lab 10/16/18 0234 10/17/18 0305  WBC 7.4 8.8  NEUTROABS 5.5  --   HGB 11.7* 12.0  HCT 38.8 41.1  MCV 105.1* 107.3*  PLT 117* 117*   Cardiac Enzymes: Recent Labs  Lab 10/16/18 0234 10/16/18 0847 10/16/18 1214 10/16/18 1809  TROPONINI 0.04* 0.05* 0.05*  0.05*   BNP: Invalid input(s): POCBNP CBG: Recent Labs  Lab 10/18/18 0728 10/18/18 1142 10/18/18 1658 10/18/18 2114 10/19/18 0750  GLUCAP 88 125* 114* 115* 94   D-Dimer No results for input(s): DDIMER in the last 72 hours. Hgb A1c Recent Labs    10/17/18 0305  HGBA1C 6.0*   Lipid Profile Recent Labs    10/17/18 0305  CHOL 92  HDL 35*  LDLCALC 45  TRIG 59  CHOLHDL 2.6   Thyroid function studies Recent Labs    10/17/18 0305  TSH 2.409   Anemia work up No results for input(s): VITAMINB12, FOLATE, FERRITIN, TIBC, IRON, RETICCTPCT in the last 72 hours. Urinalysis    Component Value Date/Time   COLORURINE YELLOW 10/17/2018 0600   APPEARANCEUR CLEAR 10/17/2018 0600   LABSPEC 1.008 10/17/2018 0600   PHURINE 6.0 10/17/2018 0600   GLUCOSEU NEGATIVE 10/17/2018 0600   HGBUR NEGATIVE 10/17/2018 0600   BILIRUBINUR NEGATIVE 10/17/2018 0600   KETONESUR NEGATIVE 10/17/2018 0600   PROTEINUR NEGATIVE 10/17/2018 0600   UROBILINOGEN 1.0 12/14/2014 1103   NITRITE NEGATIVE 10/17/2018 0600   LEUKOCYTESUR SMALL (A) 10/17/2018 0600   Sepsis Labs Invalid input(s): PROCALCITONIN,  WBC,  LACTICIDVEN Microbiology Recent Results (from the past 240 hour(s))  SARS Coronavirus 2 (CEPHEID - Performed in [facility] hospital lab), Hosp Order     Status: None   Collection Time: 10/16/18  2:34 AM  Result Value Ref Range Status   SARS Coronavirus 2 NEGATIVE NEGATIVE Final    Comment: (NOTE) If result is NEGATIVE SARS-CoV-2 target nucleic acids are NOT  DETECTED. The SARS-CoV-2 RNA is generally detectable in upper and lower  respiratory specimens during the acute phase of infection. The lowest  concentration of SARS-CoV-2 viral copies this assay can detect is 250  copies / mL. A negative result does not preclude SARS-CoV-2 infection  and should not be used as the sole basis for treatment or other  patient management decisions.  A negative result may occur with  improper specimen collection / handling, submission of specimen other  than nasopharyngeal swab, presence of viral mutation(s) within the  areas targeted by this assay, and inadequate number of viral copies  (<250 copies / mL). A negative result must be combined with clinical  observations, patient history, and epidemiological information. If result is POSITIVE SARS-CoV-2 target nucleic acids are DETECTED. The SARS-CoV-2 RNA is generally detectable in upper and lower  respiratory specimens dur ing the acute phase of infection.  Positive  results are indicative of active infection with SARS-CoV-2.  Clinical  correlation with patient history and other diagnostic information is  necessary to determine patient infection status.  Positive results do  not rule out bacterial infection or co-infection with other viruses. If result is PRESUMPTIVE POSTIVE SARS-CoV-2 nucleic acids MAY BE PRESENT.   A presumptive positive result was obtained on the submitted specimen  and confirmed on repeat testing.  While 2019 novel coronavirus  (SARS-CoV-2) nucleic acids may be present in the submitted sample  additional confirmatory testing may be necessary for epidemiological  and / or clinical management purposes  to differentiate between  SARS-CoV-2 and other Sarbecovirus currently known to infect humans.  If clinically indicated additional testing with an alternate test  methodology [phone]([id number]) is advised. The SARS-CoV-2 RNA is generally  detectable in upper and lower respiratory sp ecimens during  the acute  phase of infection. The expected result is Negative. Fact Sheet for Patients:  BoilerBrush.com.cyhttps://www.fda.gov/media/136312/download Fact Sheet for Healthcare Providers: https://pope.com/https://www.fda.gov/media/136313/download This  test is not yet approved or cleared by the [place] and has been authorized for detection and/or diagnosis of SARS-CoV-2 by FDA under an Emergency Use Authorization (EUA).  This EUA will remain in effect (meaning this test can be used) for the duration of the COVID-19 declaration under Section 564(b)(1) of the Act, 21 U.S.C. section 360bbb-3(b)(1), unless the authorization is terminated or revoked sooner. Performed at [facility], [address], [place], [place] [address]   MRSA PCR Screening     Status: None   Collection Time: 10/16/18  9:53 AM  Result Value Ref Range Status   MRSA by PCR NEGATIVE NEGATIVE Final    Comment:        The GeneXpert MRSA Assay (FDA approved for NASAL specimens only), is one component of a comprehensive MRSA colonization surveillance program. It is not intended to diagnose MRSA infection nor to guide or monitor treatment for MRSA infections. Performed at [facility], [address], [place], [place] [address]     Please note: You were cared for by a hospitalist during your hospital stay. Once you are discharged, your primary care physician will handle any further medical issues. Please note that NO REFILLS for any discharge medications will be authorized once you are discharged, as it is imperative that you return to your primary care physician (or establish a relationship with a primary care physician if you do not have one) for your post hospital discharge needs so that they can reassess your need for medications and monitor your lab values.    Time coordinating discharge: 40 minutes  SIGNED:   Burnadette Pop, MD  [facility] 10/19/2018, 10:48 AM Pager [phone]  If  7PM-7AM, please contact night-coverage [email or web address] Password [email or web address]

## 2018-10-20 ENCOUNTER — Non-Acute Institutional Stay (SKILLED_NURSING_FACILITY): Payer: Medicare Other | Admitting: Adult Health

## 2018-10-20 ENCOUNTER — Encounter: Payer: Self-pay | Admitting: Adult Health

## 2018-10-20 DIAGNOSIS — I5023 Acute on chronic systolic (congestive) heart failure: Secondary | ICD-10-CM | POA: Diagnosis not present

## 2018-10-20 DIAGNOSIS — I10 Essential (primary) hypertension: Secondary | ICD-10-CM | POA: Diagnosis not present

## 2018-10-20 DIAGNOSIS — I4891 Unspecified atrial fibrillation: Secondary | ICD-10-CM

## 2018-10-20 DIAGNOSIS — B372 Candidiasis of skin and nail: Secondary | ICD-10-CM

## 2018-10-20 DIAGNOSIS — E1165 Type 2 diabetes mellitus with hyperglycemia: Secondary | ICD-10-CM | POA: Diagnosis not present

## 2018-10-20 DIAGNOSIS — Z7189 Other specified counseling: Secondary | ICD-10-CM
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
## 2018-10-21 ENCOUNTER — Encounter: Payer: Self-pay | Admitting: Adult Health

## 2018-10-23 ENCOUNTER — Non-Acute Institutional Stay (SKILLED_NURSING_FACILITY): Payer: Medicare Other | Admitting: Internal Medicine

## 2018-10-23 ENCOUNTER — Encounter: Payer: Self-pay | Admitting: Internal Medicine

## 2018-10-23 DIAGNOSIS — E119 Type 2 diabetes mellitus without complications: Secondary | ICD-10-CM | POA: Diagnosis not present

## 2018-10-23 DIAGNOSIS — I482 Chronic atrial fibrillation, unspecified: Secondary | ICD-10-CM

## 2018-10-23 DIAGNOSIS — I10 Essential (primary) hypertension: Secondary | ICD-10-CM

## 2018-10-23 DIAGNOSIS — Z9114 Patient's other noncompliance with medication regimen: Secondary | ICD-10-CM

## 2018-10-23 DIAGNOSIS — I5023 Acute on chronic systolic (congestive) heart failure: Secondary | ICD-10-CM | POA: Diagnosis not present

## 2018-10-23 LAB — CBC AND DIFFERENTIAL
HCT: 36 (ref 36–46)
Hemoglobin: 12.1 (ref 12.0–16.0)
Platelets: 147 — AB (ref 150–399)
WBC: 6.2

## 2018-10-23 LAB — BASIC METABOLIC PANEL
BUN: 14 (ref 4–21)
Creatinine: 0.9 (ref 0.5–1.1)
Glucose: 93
Potassium: 3.9 (ref 3.4–5.3)
Sodium: 139 (ref 137–147)
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
## 2018-10-24 LAB — BASIC METABOLIC PANEL
BUN: 15 (ref 4–21)
Creatinine: 1 (ref 0.5–1.1)
Glucose: 108
Potassium: 4 (ref 3.4–5.3)
Sodium: 141 (ref 137–147)

## 2018-10-24 LAB — CBC AND DIFFERENTIAL
HCT: 37 (ref 36–46)
Hemoglobin: 12.1 (ref 12.0–16.0)
Platelets: 155 (ref 150–399)
WBC: 6.1

## 2018-10-25 ENCOUNTER — Encounter: Payer: Self-pay | Admitting: Internal Medicine

## 2018-10-25 ENCOUNTER — Non-Acute Institutional Stay (SKILLED_NURSING_FACILITY): Payer: Medicare Other | Admitting: Internal Medicine

## 2018-10-25 DIAGNOSIS — I5023 Acute on chronic systolic (congestive) heart failure: Secondary | ICD-10-CM

## 2018-10-25 DIAGNOSIS — J9601 Acute respiratory failure with hypoxia: Secondary | ICD-10-CM | POA: Diagnosis not present

## 2018-10-25 DIAGNOSIS — Z794 Long term (current) use of insulin: Secondary | ICD-10-CM

## 2018-10-25 DIAGNOSIS — I482 Chronic atrial fibrillation, unspecified: Secondary | ICD-10-CM

## 2018-10-25 DIAGNOSIS — E1165 Type 2 diabetes mellitus with hyperglycemia: Secondary | ICD-10-CM

## 2018-10-25 DIAGNOSIS — Z91199 Patient's noncompliance with other medical treatment and regimen due to unspecified reason: Secondary | ICD-10-CM

## 2018-10-25 DIAGNOSIS — Z9119 Patient's noncompliance with other medical treatment and regimen: Secondary | ICD-10-CM

## 2018-10-25 NOTE — Assessment & Plan Note (Addendum)
I explained the pathophysiology of acute on chronic heart failure complicated by acute hypoxic respiratory failure due to pulmonary edema in the context of ejection fraction of 25-30% and noncompliance with diuretics.  I also discussed the role of A. fib in contributing to but not causing the heart failure.  I discussed the associated risk of stroke with A. fib and the protective role of anticoagulation.  She seems to indicate that she will be compliant with her diuretics.

## 2018-10-25 NOTE — Assessment & Plan Note (Signed)
10/25/2018 chest is clear with no clinical evidence of cardiac decompensation

## 2018-10-25 NOTE — Assessment & Plan Note (Deleted)
Focus is rate control
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
## 2018-10-25 NOTE — Assessment & Plan Note (Addendum)
Rate controlled 

## 2018-10-25 NOTE — Patient Instructions (Addendum)
See assessment and plan under each diagnosis in the problem list and acutely for this visit Total time 49  minutes; greater than 50% of the visit spent counseling patient ( see discussion concerning pathophysiology of comorbidities with associated risk of mortality ) and coordinating care for problems addressed at this encounter. [facility] caregiver relationship will need to be verified.  The medication list was reviewed with her Nurse and noncompliance documented.  Meds adjusted based on these findings.

## 2018-10-25 NOTE — Assessment & Plan Note (Addendum)
Glucoses have not been elevated @ SNF Patient refuses metformin; this was discontinued 6/8 Monitor only fasting glucoses Monday, Wednesday, and Friday.  Notification requested if fasting glucose greater than 200.  With her comorbidities, A1c goal is less than 8%.

## 2018-10-25 NOTE — Assessment & Plan Note (Addendum)
10/25/2018 she clinically does not appear to have neurocognitive deficits; he may be in denial as to the seriousness of her multiple comorbidities.   SLUMS testing indicated because of her noncompliance with associated high risk of complication and even death.  CODE STATUS will need to be verified with her treating physician.

## 2018-10-30 LAB — NOVEL CORONAVIRUS, NAA: SARS-CoV-2, NAA: NOT DETECTED

## 2018-11-01 ENCOUNTER — Encounter: Payer: Self-pay | Admitting: Internal Medicine

## 2018-11-01 DIAGNOSIS — R29818 Other symptoms and signs involving the nervous system: Secondary | ICD-10-CM | POA: Insufficient documentation

## 2018-11-01 DIAGNOSIS — R4189 Other symptoms and signs involving cognitive functions and awareness: Secondary | ICD-10-CM | POA: Insufficient documentation

## 2018-11-02 ENCOUNTER — Non-Acute Institutional Stay (SKILLED_NURSING_FACILITY): Payer: Medicare Other | Admitting: Internal Medicine

## 2018-11-02 ENCOUNTER — Encounter: Payer: Self-pay | Admitting: Internal Medicine

## 2018-11-02 DIAGNOSIS — E1165 Type 2 diabetes mellitus with hyperglycemia: Secondary | ICD-10-CM

## 2018-11-02 DIAGNOSIS — Z794 Long term (current) use of insulin: Secondary | ICD-10-CM

## 2018-11-02 DIAGNOSIS — K529 Noninfective gastroenteritis and colitis, unspecified: Secondary | ICD-10-CM

## 2018-11-02 DIAGNOSIS — I482 Chronic atrial fibrillation, unspecified: Secondary | ICD-10-CM

## 2018-11-02 DIAGNOSIS — I5023 Acute on chronic systolic (congestive) heart failure: Secondary | ICD-10-CM

## 2018-11-02 DIAGNOSIS — I10 Essential (primary) hypertension: Secondary | ICD-10-CM
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
## 2018-11-04 ENCOUNTER — Encounter: Payer: Self-pay | Admitting: Internal Medicine

## 2018-11-14 ENCOUNTER — Telehealth (HOSPITAL_COMMUNITY): Payer: Self-pay | Admitting: Cardiology

## 2018-11-14 NOTE — Telephone Encounter (Signed)
6/30 - Unable to complete COVID screening, no answer and v/m not set up.  -[facility]

## 2018-11-15 ENCOUNTER — Ambulatory Visit (HOSPITAL_COMMUNITY): Admission: RE | Admit: 2018-11-15 | Payer: Medicare Other | Source: Ambulatory Visit | Admitting: Cardiology

## 2018-11-15 ENCOUNTER — Other Ambulatory Visit: Payer: Self-pay

## 2018-11-21 ENCOUNTER — Non-Acute Institutional Stay (SKILLED_NURSING_FACILITY): Payer: Medicare Other | Admitting: Adult Health

## 2018-11-21 ENCOUNTER — Encounter: Payer: Self-pay | Admitting: Adult Health

## 2018-11-21 DIAGNOSIS — E538 Deficiency of other specified B group vitamins: Secondary | ICD-10-CM | POA: Diagnosis not present

## 2018-11-21 DIAGNOSIS — I5022 Chronic systolic (congestive) heart failure: Secondary | ICD-10-CM | POA: Diagnosis not present

## 2018-11-21 DIAGNOSIS — I482 Chronic atrial fibrillation, unspecified: Secondary | ICD-10-CM | POA: Diagnosis not present
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
## 2018-11-22 DIAGNOSIS — E538 Deficiency of other specified B group vitamins: Secondary | ICD-10-CM | POA: Insufficient documentation

## 2018-11-22 DIAGNOSIS — I5022 Chronic systolic (congestive) heart failure: Secondary | ICD-10-CM | POA: Insufficient documentation

## 2018-12-12 ENCOUNTER — Encounter: Payer: Self-pay | Admitting: Adult Health

## 2018-12-12 ENCOUNTER — Non-Acute Institutional Stay (SKILLED_NURSING_FACILITY): Payer: Medicare Other | Admitting: Adult Health

## 2018-12-12 DIAGNOSIS — I482 Chronic atrial fibrillation, unspecified: Secondary | ICD-10-CM | POA: Diagnosis not present

## 2018-12-12 DIAGNOSIS — E538 Deficiency of other specified B group vitamins: Secondary | ICD-10-CM

## 2018-12-12 DIAGNOSIS — I5022 Chronic systolic (congestive) heart failure: Secondary | ICD-10-CM

## 2018-12-12 DIAGNOSIS — E119 Type 2 diabetes mellitus without complications: Secondary | ICD-10-CM
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
## 2018-12-13 LAB — HM HEPATITIS C SCREENING LAB: HM Hepatitis Screen: NEGATIVE

## 2018-12-13 LAB — MICROALBUMIN, URINE: Microalb, Ur: 1.2

## 2018-12-18 ENCOUNTER — Non-Acute Institutional Stay (SKILLED_NURSING_FACILITY): Payer: Medicare Other | Admitting: Adult Health

## 2018-12-18 ENCOUNTER — Encounter: Payer: Self-pay | Admitting: Adult Health

## 2018-12-18 DIAGNOSIS — I482 Chronic atrial fibrillation, unspecified: Secondary | ICD-10-CM

## 2018-12-18 DIAGNOSIS — E119 Type 2 diabetes mellitus without complications: Secondary | ICD-10-CM | POA: Diagnosis not present
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
## 2018-12-23 ENCOUNTER — Emergency Department (HOSPITAL_COMMUNITY): Payer: Medicare Other

## 2018-12-23 ENCOUNTER — Other Ambulatory Visit: Payer: Self-pay

## 2018-12-23 ENCOUNTER — Emergency Department (HOSPITAL_COMMUNITY)
Admission: EM | Admit: 2018-12-23 | Discharge: 2018-12-23 | Disposition: A | Discharge status: Home / Self Care | Payer: Medicare Other | Attending: Emergency Medicine | Admitting: Emergency Medicine

## 2018-12-23 ENCOUNTER — Encounter (HOSPITAL_COMMUNITY): Payer: Self-pay

## 2018-12-23 DIAGNOSIS — R072 Precordial pain: Secondary | ICD-10-CM | POA: Diagnosis not present

## 2018-12-23 DIAGNOSIS — I2510 Atherosclerotic heart disease of native coronary artery without angina pectoris: Secondary | ICD-10-CM | POA: Diagnosis not present

## 2018-12-23 DIAGNOSIS — Z79899 Other long term (current) drug therapy: Secondary | ICD-10-CM | POA: Insufficient documentation

## 2018-12-23 DIAGNOSIS — I110 Hypertensive heart disease with heart failure: Secondary | ICD-10-CM | POA: Insufficient documentation

## 2018-12-23 DIAGNOSIS — M546 Pain in thoracic spine: Secondary | ICD-10-CM

## 2018-12-23 DIAGNOSIS — I5042 Chronic combined systolic (congestive) and diastolic (congestive) heart failure: Secondary | ICD-10-CM | POA: Insufficient documentation

## 2018-12-23 DIAGNOSIS — R197 Diarrhea, unspecified: Secondary | ICD-10-CM | POA: Diagnosis not present

## 2018-12-23 DIAGNOSIS — Z7901 Long term (current) use of anticoagulants: Secondary | ICD-10-CM | POA: Insufficient documentation

## 2018-12-23 DIAGNOSIS — I252 Old myocardial infarction: Secondary | ICD-10-CM | POA: Insufficient documentation

## 2018-12-23 DIAGNOSIS — R079 Chest pain, unspecified: Secondary | ICD-10-CM | POA: Diagnosis present

## 2018-12-23 DIAGNOSIS — R112 Nausea with vomiting, unspecified: Secondary | ICD-10-CM | POA: Diagnosis not present

## 2018-12-23 DIAGNOSIS — E119 Type 2 diabetes mellitus without complications: Secondary | ICD-10-CM | POA: Insufficient documentation

## 2018-12-23 LAB — BASIC METABOLIC PANEL
Anion gap: 12 (ref 5–15)
BUN: 23 mg/dL (ref 8–23)
CO2: 22 mmol/L (ref 22–32)
Calcium: 9 mg/dL (ref 8.9–10.3)
Chloride: 103 mmol/L (ref 98–111)
Creatinine, Ser: 1.07 mg/dL — ABNORMAL HIGH (ref 0.44–1.00)
GFR calc Af Amer: >60 mL/min (ref >60)
GFR calc non Af Amer: 53 mL/min — ABNORMAL LOW (ref >60)
Glucose, Bld: 217 mg/dL — ABNORMAL HIGH (ref 70–99)
Potassium: 4.2 mmol/L (ref 3.5–5.1)
Sodium: 137 mmol/L (ref 135–145)

## 2018-12-23 LAB — CBC
HCT: 34.1 % — ABNORMAL LOW (ref 36.0–46.0)
Hemoglobin: 11 g/dL — ABNORMAL LOW (ref 12.0–15.0)
MCH: 32.5 pg (ref 26.0–34.0)
MCHC: 32.3 g/dL (ref 30.0–36.0)
MCV: 100.9 fL — ABNORMAL HIGH (ref 80.0–100.0)
Platelets: 176 10*3/uL (ref 150–400)
RBC: 3.38 MIL/uL — ABNORMAL LOW (ref 3.87–5.11)
RDW: 16.1 % — ABNORMAL HIGH (ref 11.5–15.5)
WBC: 13 10*3/uL — ABNORMAL HIGH (ref 4.0–10.5)
nRBC: 0 % (ref 0.0–0.2)

## 2018-12-23 LAB — TROPONIN I (HIGH SENSITIVITY)
Troponin I (High Sensitivity): 9 ng/L (ref <18)
Troponin I (High Sensitivity): 9 ng/L (ref <18)

## 2018-12-23 MED ORDER — OXYCODONE HCL 5 MG PO TABS
5.0000 mg | ORAL_TABLET | Freq: Once | ORAL | Status: AC
  Administered 2018-12-23: 5 mg via ORAL
  Filled 2018-12-23: qty 1

## 2018-12-23 MED ORDER — ONDANSETRON HCL 4 MG/2ML IJ SOLN
4.0000 mg | Freq: Once | INTRAMUSCULAR | Status: AC
  Administered 2018-12-23: 4 mg via INTRAVENOUS
  Filled 2018-12-23: qty 2

## 2018-12-23 MED ORDER — MORPHINE SULFATE (PF) 4 MG/ML IV SOLN
4.0000 mg | Freq: Once | INTRAVENOUS | Status: AC
  Administered 2018-12-23: 4 mg via INTRAVENOUS
  Filled 2018-12-23: qty 1

## 2018-12-23 MED ORDER — IOHEXOL 350 MG/ML SOLN
100.0000 mL | Freq: Once | INTRAVENOUS | Status: AC | PRN
  Administered 2018-12-23: 17:00:00 100 mL via INTRAVENOUS

## 2018-12-23 NOTE — ED Notes (Signed)
Patient verbalizes understanding of discharge instructions. Opportunity for questioning and answers were provided. Armband removed by staff, pt discharged from ED with PTAR.  

## 2018-12-23 NOTE — ED Triage Notes (Signed)
Pt brougt by [facility] from [facility] ate food 0800 then 10-15 mins after vomited, has midclavicular back pain that started yesterday. 8/10 Midsternal Chest pressure that started after vomiting episode. Loose stool 3 times today.  Pt is A&Ox4.

## 2018-12-23 NOTE — Discharge Instructions (Signed)
Please read and follow all provided instructions.  Your diagnoses today include:  1. Precordial pain   2. Acute midline thoracic back pain   3. Nausea vomiting and diarrhea     Tests performed today include:  An EKG of your heart  A chest x-ray  Cardiac enzymes - a blood test for heart muscle damage  Blood counts and electrolytes  CT of your chest, abdomen and pelvis -no signs of blood vessel dissection or blood clots as a cause of your chest and back pain  Vital signs. See below for your results today.   Medications prescribed:   None  Take any prescribed medications only as directed.  Follow-up instructions: Please follow-up with your primary care provider next week for further evaluation of your symptoms.  Return instructions:  SEEK IMMEDIATE MEDICAL ATTENTION IF:  You have severe chest pain, especially if the pain is crushing or pressure-like and spreads to the arms, back, neck, or jaw, or if you have sweating, nausea (feeling sick to your stomach), or shortness of breath. THIS IS AN EMERGENCY. Don't wait to see if the pain will go away. Get medical help at once. Call 911 or 0 (operator). DO NOT drive yourself to the hospital.   Your chest pain gets worse and does not go away with rest.   You have an attack of chest pain lasting longer than usual, despite rest and treatment with the medications your caregiver has prescribed.   You wake from sleep with chest pain or shortness of breath.  You feel dizzy or faint.  You have chest pain not typical of your usual pain for which you originally saw your caregiver.   You have any other emergent concerns regarding your health.  Additional Information: Chest pain comes from many different causes. Your caregiver has diagnosed you as having chest pain that is not specific for one problem, but does not require admission.  You are at low risk for an acute heart condition or other serious illness.   Your vital signs today  were: BP (!) 166/77    Pulse 94    Temp 98.7 F (37.1 C) (Oral)    Resp 18    Ht 5\' 11"  (1.803 m)    Wt 99.3 kg    SpO2 96%    BMI 30.54 kg/m  If your blood pressure (BP) was elevated above 135/85 this visit, please have this repeated by your doctor within one month. --------------

## 2018-12-23 NOTE — ED Notes (Signed)
Patient transported to CT 
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
## 2019-01-02 ENCOUNTER — Encounter: Payer: Self-pay | Admitting: Adult Health

## 2019-01-02 ENCOUNTER — Non-Acute Institutional Stay (SKILLED_NURSING_FACILITY): Payer: Medicare Other | Admitting: Adult Health

## 2019-01-02 DIAGNOSIS — I482 Chronic atrial fibrillation, unspecified: Secondary | ICD-10-CM | POA: Diagnosis not present

## 2019-01-02 DIAGNOSIS — E1169 Type 2 diabetes mellitus with other specified complication: Secondary | ICD-10-CM

## 2019-01-02 DIAGNOSIS — I5022 Chronic systolic (congestive) heart failure: Secondary | ICD-10-CM
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
## 2019-01-04 ENCOUNTER — Non-Acute Institutional Stay (SKILLED_NURSING_FACILITY): Payer: Medicare Other | Admitting: Adult Health

## 2019-01-04 ENCOUNTER — Encounter: Payer: Self-pay | Admitting: Adult Health

## 2019-01-04 DIAGNOSIS — K529 Noninfective gastroenteritis and colitis, unspecified: Secondary | ICD-10-CM | POA: Diagnosis not present

## 2019-01-04 DIAGNOSIS — I482 Chronic atrial fibrillation, unspecified: Secondary | ICD-10-CM

## 2019-01-04 DIAGNOSIS — I5022 Chronic systolic (congestive) heart failure: Secondary | ICD-10-CM | POA: Diagnosis not present

## 2019-01-04 DIAGNOSIS — E1169 Type 2 diabetes mellitus with other specified complication: Secondary | ICD-10-CM | POA: Diagnosis not present

## 2019-01-04 DIAGNOSIS — Z7189 Other specified counseling: Secondary | ICD-10-CM | POA: Insufficient documentation
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
## 2019-01-23 ENCOUNTER — Encounter: Payer: Self-pay | Admitting: Adult Health

## 2019-01-23 ENCOUNTER — Non-Acute Institutional Stay (SKILLED_NURSING_FACILITY): Payer: Medicare Other | Admitting: Adult Health

## 2019-01-23 DIAGNOSIS — I4891 Unspecified atrial fibrillation: Secondary | ICD-10-CM

## 2019-01-23 DIAGNOSIS — I5022 Chronic systolic (congestive) heart failure: Secondary | ICD-10-CM | POA: Diagnosis not present

## 2019-01-23 DIAGNOSIS — E1169 Type 2 diabetes mellitus with other specified complication: Secondary | ICD-10-CM

## 2019-01-23 DIAGNOSIS — R195 Other fecal abnormalities: Secondary | ICD-10-CM | POA: Diagnosis not present
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
## 2019-01-26 ENCOUNTER — Non-Acute Institutional Stay (SKILLED_NURSING_FACILITY): Payer: Medicare Other | Admitting: Adult Health

## 2019-01-26 ENCOUNTER — Encounter: Payer: Self-pay | Admitting: Adult Health

## 2019-01-26 DIAGNOSIS — R195 Other fecal abnormalities: Secondary | ICD-10-CM
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
## 2019-02-01 ENCOUNTER — Encounter: Payer: Self-pay | Admitting: Adult Health

## 2019-02-06 ENCOUNTER — Encounter: Payer: Self-pay | Admitting: Adult Health

## 2019-02-06 ENCOUNTER — Non-Acute Institutional Stay (SKILLED_NURSING_FACILITY): Payer: Medicare Other | Admitting: Adult Health

## 2019-02-06 DIAGNOSIS — I5022 Chronic systolic (congestive) heart failure: Secondary | ICD-10-CM | POA: Diagnosis not present

## 2019-02-06 DIAGNOSIS — E1169 Type 2 diabetes mellitus with other specified complication: Secondary | ICD-10-CM

## 2019-02-06 DIAGNOSIS — I482 Chronic atrial fibrillation, unspecified: Secondary | ICD-10-CM

## 2019-02-06 DIAGNOSIS — R4189 Other symptoms and signs involving cognitive functions and awareness: Secondary | ICD-10-CM

## 2019-02-06 DIAGNOSIS — R29818 Other symptoms and signs involving the nervous system: Secondary | ICD-10-CM

## 2019-02-06 MED ORDER — SITAGLIPTIN PHOSPHATE 25 MG PO TABS: 25.0000 mg | ORAL_TABLET | Freq: Every day | ORAL | 0 refills | Status: DC

## 2019-02-06 MED ORDER — DIGOXIN 125 MCG PO TABS: 0.1250 mg | ORAL_TABLET | Freq: Every day | ORAL | 0 refills | Status: DC

## 2019-02-06 MED ORDER — FUROSEMIDE 40 MG PO TABS: 40.0000 mg | ORAL_TABLET | Freq: Every day | ORAL | 1 refills | Status: DC

## 2019-02-06 MED ORDER — POTASSIUM CHLORIDE 20 MEQ/15ML (10%) PO SOLN: 20.0000 meq | Freq: Every day | ORAL | 0 refills | Status: DC

## 2019-02-06 MED ORDER — APIXABAN 5 MG PO TABS: 5.0000 mg | ORAL_TABLET | Freq: Two times a day (BID) | ORAL | 0 refills | Status: DC
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
## 2019-02-19 ENCOUNTER — Ambulatory Visit: Payer: Medicare Other | Admitting: Family

## 2019-02-27 ENCOUNTER — Other Ambulatory Visit: Payer: Self-pay

## 2019-02-27 ENCOUNTER — Emergency Department (HOSPITAL_COMMUNITY)
Admission: EM | Admit: 2019-02-27 | Discharge: 2019-02-27 | Disposition: A | Discharge status: Home / Self Care | Payer: Medicare Other | Attending: Emergency Medicine | Admitting: Emergency Medicine

## 2019-02-27 DIAGNOSIS — Z79899 Other long term (current) drug therapy: Secondary | ICD-10-CM | POA: Insufficient documentation

## 2019-02-27 DIAGNOSIS — R197 Diarrhea, unspecified: Secondary | ICD-10-CM | POA: Diagnosis present

## 2019-02-27 DIAGNOSIS — I509 Heart failure, unspecified: Secondary | ICD-10-CM | POA: Diagnosis not present

## 2019-02-27 DIAGNOSIS — I2510 Atherosclerotic heart disease of native coronary artery without angina pectoris: Secondary | ICD-10-CM | POA: Insufficient documentation

## 2019-02-27 DIAGNOSIS — I4891 Unspecified atrial fibrillation: Secondary | ICD-10-CM | POA: Insufficient documentation

## 2019-02-27 DIAGNOSIS — E119 Type 2 diabetes mellitus without complications: Secondary | ICD-10-CM | POA: Diagnosis not present

## 2019-02-27 DIAGNOSIS — I110 Hypertensive heart disease with heart failure: Secondary | ICD-10-CM | POA: Insufficient documentation

## 2019-02-27 LAB — COMPREHENSIVE METABOLIC PANEL
ALT: 8 U/L (ref 0–44)
AST: 11 U/L — ABNORMAL LOW (ref 15–41)
Albumin: 3.7 g/dL (ref 3.5–5.0)
Alkaline Phosphatase: 50 U/L (ref 38–126)
Anion gap: 10 (ref 5–15)
BUN: 14 mg/dL (ref 8–23)
CO2: 21 mmol/L — ABNORMAL LOW (ref 22–32)
Calcium: 8.7 mg/dL — ABNORMAL LOW (ref 8.9–10.3)
Chloride: 109 mmol/L (ref 98–111)
Creatinine, Ser: 0.96 mg/dL (ref 0.44–1.00)
GFR calc Af Amer: >60 mL/min (ref >60)
GFR calc non Af Amer: >60 mL/min (ref >60)
Glucose, Bld: 126 mg/dL — ABNORMAL HIGH (ref 70–99)
Potassium: 3 mmol/L — ABNORMAL LOW (ref 3.5–5.1)
Sodium: 140 mmol/L (ref 135–145)
Total Bilirubin: 0.8 mg/dL (ref 0.3–1.2)
Total Protein: 6.9 g/dL (ref 6.5–8.1)

## 2019-02-27 LAB — CBC WITH DIFFERENTIAL/PLATELET
Abs Immature Granulocytes: 0.05 10*3/uL (ref 0.00–0.07)
Basophils Absolute: 0.1 10*3/uL (ref 0.0–0.1)
Basophils Relative: 1 %
Eosinophils Absolute: 0.4 10*3/uL (ref 0.0–0.5)
Eosinophils Relative: 4 %
HCT: 33.3 % — ABNORMAL LOW (ref 36.0–46.0)
Hemoglobin: 10.6 g/dL — ABNORMAL LOW (ref 12.0–15.0)
Immature Granulocytes: 1 %
Lymphocytes Relative: 14 %
Lymphs Abs: 1.5 10*3/uL (ref 0.7–4.0)
MCH: 30.9 pg (ref 26.0–34.0)
MCHC: 31.8 g/dL (ref 30.0–36.0)
MCV: 97.1 fL (ref 80.0–100.0)
Monocytes Absolute: 0.7 10*3/uL (ref 0.1–1.0)
Monocytes Relative: 6 %
Neutro Abs: 7.9 10*3/uL — ABNORMAL HIGH (ref 1.7–7.7)
Neutrophils Relative %: 74 %
Platelets: 162 10*3/uL (ref 150–400)
RBC: 3.43 MIL/uL — ABNORMAL LOW (ref 3.87–5.11)
RDW: 14.1 % (ref 11.5–15.5)
WBC: 10.6 10*3/uL — ABNORMAL HIGH (ref 4.0–10.5)
nRBC: 0 % (ref 0.0–0.2)

## 2019-02-27 LAB — MAGNESIUM: Magnesium: 2 mg/dL (ref 1.7–2.4)

## 2019-02-27 MED ORDER — LACTATED RINGERS IV BOLUS
500.0000 mL | Freq: Once | INTRAVENOUS | Status: AC
  Administered 2019-02-27: 500 mL via INTRAVENOUS
  Filled 2019-02-27: qty 500

## 2019-02-27 NOTE — Discharge Instructions (Signed)
Please follow-up with GI doctor and your primary care doctor for diarrhea.  Chronic diarrhea could be because of several reasons including medication side effects and primary GI conditions like irritable bowel syndrome or hypersensitivities to certain foods.

## 2019-02-27 NOTE — ED Notes (Signed)
Patient requested assistance with changing her soiled under

## 2019-02-27 NOTE — ED Notes (Signed)
[facility] contacted for transportation.  
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
## 2019-02-27 NOTE — ED Triage Notes (Signed)
Per Ems, patient c/o diarrhea for a week. Last episode was midnight. Patient also c/o weakness in both legs.

## 2019-03-20 ENCOUNTER — Encounter (HOSPITAL_COMMUNITY): Payer: Self-pay

## 2019-03-20 ENCOUNTER — Emergency Department (HOSPITAL_COMMUNITY)
Admission: EM | Admit: 2019-03-20 | Discharge: 2019-03-21 | Disposition: A | Discharge status: Home / Self Care | Payer: Medicare Other | Attending: Emergency Medicine | Admitting: Emergency Medicine

## 2019-03-20 ENCOUNTER — Other Ambulatory Visit: Payer: Self-pay

## 2019-03-20 DIAGNOSIS — K5903 Drug induced constipation: Secondary | ICD-10-CM

## 2019-03-20 DIAGNOSIS — Z7901 Long term (current) use of anticoagulants: Secondary | ICD-10-CM | POA: Diagnosis not present

## 2019-03-20 DIAGNOSIS — I5022 Chronic systolic (congestive) heart failure: Secondary | ICD-10-CM | POA: Diagnosis not present

## 2019-03-20 DIAGNOSIS — Z79899 Other long term (current) drug therapy: Secondary | ICD-10-CM | POA: Diagnosis not present

## 2019-03-20 DIAGNOSIS — E119 Type 2 diabetes mellitus without complications: Secondary | ICD-10-CM | POA: Diagnosis not present

## 2019-03-20 DIAGNOSIS — K5900 Constipation, unspecified: Secondary | ICD-10-CM | POA: Diagnosis present

## 2019-03-20 DIAGNOSIS — I110 Hypertensive heart disease with heart failure: Secondary | ICD-10-CM | POA: Insufficient documentation

## 2019-03-20 DIAGNOSIS — I2510 Atherosclerotic heart disease of native coronary artery without angina pectoris: Secondary | ICD-10-CM | POA: Insufficient documentation

## 2019-03-20 NOTE — ED Triage Notes (Signed)
To triage via EMS.  Pt here for constipation.  Had piece of stool today, last stool 2 days.  Pt reports diarrhea for past year and takes immodium or other meds then she will get constipation.   EMS BP 170/90 HR 80, SpO2 99% Temp 97.0 Pt states she gets out of recliner onto rolling desk chair and gets herself to bathroom.  Pt has on dirty hospital gown.  Pt has on 3 pull up diapers.   Denies N/V or abd pain.

## 2019-03-21 LAB — CBC WITH DIFFERENTIAL/PLATELET
Abs Immature Granulocytes: 0.03 10*3/uL (ref 0.00–0.07)
Basophils Absolute: 0.1 10*3/uL (ref 0.0–0.1)
Basophils Relative: 1 %
Eosinophils Absolute: 0.3 10*3/uL (ref 0.0–0.5)
Eosinophils Relative: 4 %
HCT: 38.2 % (ref 36.0–46.0)
Hemoglobin: 12 g/dL (ref 12.0–15.0)
Immature Granulocytes: 0 %
Lymphocytes Relative: 10 %
Lymphs Abs: 0.9 10*3/uL (ref 0.7–4.0)
MCH: 29.9 pg (ref 26.0–34.0)
MCHC: 31.4 g/dL (ref 30.0–36.0)
MCV: 95.3 fL (ref 80.0–100.0)
Monocytes Absolute: 0.5 10*3/uL (ref 0.1–1.0)
Monocytes Relative: 6 %
Neutro Abs: 6.6 10*3/uL (ref 1.7–7.7)
Neutrophils Relative %: 79 %
Platelets: 160 10*3/uL (ref 150–400)
RBC: 4.01 MIL/uL (ref 3.87–5.11)
RDW: 14.3 % (ref 11.5–15.5)
WBC: 8.4 10*3/uL (ref 4.0–10.5)
nRBC: 0 % (ref 0.0–0.2)

## 2019-03-21 LAB — COMPREHENSIVE METABOLIC PANEL
ALT: 9 U/L (ref 0–44)
AST: 17 U/L (ref 15–41)
Albumin: 4 g/dL (ref 3.5–5.0)
Alkaline Phosphatase: 53 U/L (ref 38–126)
Anion gap: 11 (ref 5–15)
BUN: 10 mg/dL (ref 8–23)
CO2: 23 mmol/L (ref 22–32)
Calcium: 8.9 mg/dL (ref 8.9–10.3)
Chloride: 107 mmol/L (ref 98–111)
Creatinine, Ser: 1.06 mg/dL — ABNORMAL HIGH (ref 0.44–1.00)
GFR calc Af Amer: >60 mL/min (ref >60)
GFR calc non Af Amer: 54 mL/min — ABNORMAL LOW (ref >60)
Glucose, Bld: 142 mg/dL — ABNORMAL HIGH (ref 70–99)
Potassium: 3.5 mmol/L (ref 3.5–5.1)
Sodium: 141 mmol/L (ref 135–145)
Total Bilirubin: 0.9 mg/dL (ref 0.3–1.2)
Total Protein: 7.9 g/dL (ref 6.5–8.1)

## 2019-03-21 LAB — LIPASE, BLOOD: Lipase: 21 U/L (ref 11–51)

## 2019-03-21 MED ORDER — DICYCLOMINE HCL 10 MG PO CAPS
10.0000 mg | ORAL_CAPSULE | Freq: Once | ORAL | Status: AC
  Administered 2019-03-21: 13:00:00 10 mg via ORAL
  Filled 2019-03-21: qty 1

## 2019-03-21 MED ORDER — SORBITOL 70 % SOLN
960.0000 mL | TOPICAL_OIL | Freq: Once | ORAL | Status: AC
  Administered 2019-03-21: 960 mL via RECTAL
  Filled 2019-03-21: qty 473

## 2019-03-21 MED ORDER — LIDOCAINE HCL URETHRAL/MUCOSAL 2 % EX GEL
1.0000 "application " | Freq: Once | CUTANEOUS | Status: AC
  Administered 2019-03-21: 1 via TOPICAL
  Filled 2019-03-21: qty 20

## 2019-03-21 NOTE — ED Notes (Signed)
Pt had 10 golf ball size  poops

## 2019-03-21 NOTE — Discharge Instructions (Signed)
Please avoid Imodium as it may contribute to your constipation.  Call and follow up with your doctor for further care.  Return if you have any concerns.
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
## 2019-03-21 NOTE — ED Notes (Signed)
[facility] called @1346 -per RN.

## 2019-03-21 NOTE — ED Notes (Addendum)
Pt had one galfball size poop in bedpan rest was diarrhea , and then soiled the bed again  States it hurts coming out and she is having cramps

## 2019-04-02 ENCOUNTER — Encounter (HOSPITAL_COMMUNITY): Payer: Self-pay | Admitting: Emergency Medicine

## 2019-04-02 ENCOUNTER — Emergency Department (HOSPITAL_COMMUNITY)
Admission: EM | Admit: 2019-04-02 | Discharge: 2019-04-03 | Disposition: A | Discharge status: Home / Self Care | Payer: Medicare Other | Attending: Emergency Medicine | Admitting: Emergency Medicine

## 2019-04-02 ENCOUNTER — Other Ambulatory Visit: Payer: Self-pay

## 2019-04-02 ENCOUNTER — Emergency Department (HOSPITAL_COMMUNITY): Payer: Medicare Other

## 2019-04-02 DIAGNOSIS — K5900 Constipation, unspecified: Secondary | ICD-10-CM | POA: Diagnosis not present

## 2019-04-02 DIAGNOSIS — Z79899 Other long term (current) drug therapy: Secondary | ICD-10-CM | POA: Insufficient documentation

## 2019-04-02 DIAGNOSIS — I4891 Unspecified atrial fibrillation: Secondary | ICD-10-CM | POA: Insufficient documentation

## 2019-04-02 DIAGNOSIS — E119 Type 2 diabetes mellitus without complications: Secondary | ICD-10-CM | POA: Diagnosis not present

## 2019-04-02 DIAGNOSIS — Z09 Encounter for follow-up examination after completed treatment for conditions other than malignant neoplasm: Secondary | ICD-10-CM | POA: Insufficient documentation

## 2019-04-02 DIAGNOSIS — I252 Old myocardial infarction: Secondary | ICD-10-CM | POA: Insufficient documentation

## 2019-04-02 DIAGNOSIS — Z7901 Long term (current) use of anticoagulants: Secondary | ICD-10-CM | POA: Diagnosis not present

## 2019-04-02 DIAGNOSIS — I110 Hypertensive heart disease with heart failure: Secondary | ICD-10-CM | POA: Diagnosis not present

## 2019-04-02 DIAGNOSIS — I5022 Chronic systolic (congestive) heart failure: Secondary | ICD-10-CM | POA: Insufficient documentation

## 2019-04-02 LAB — BASIC METABOLIC PANEL
Anion gap: 11 (ref 5–15)
BUN: 15 mg/dL (ref 8–23)
CO2: 24 mmol/L (ref 22–32)
Calcium: 9.7 mg/dL (ref 8.9–10.3)
Chloride: 105 mmol/L (ref 98–111)
Creatinine, Ser: 0.97 mg/dL (ref 0.44–1.00)
GFR calc Af Amer: >60 mL/min (ref >60)
GFR calc non Af Amer: >60 mL/min (ref >60)
Glucose, Bld: 102 mg/dL — ABNORMAL HIGH (ref 70–99)
Potassium: 3.5 mmol/L (ref 3.5–5.1)
Sodium: 140 mmol/L (ref 135–145)

## 2019-04-02 LAB — CBC WITH DIFFERENTIAL/PLATELET
Abs Immature Granulocytes: 0.04 10*3/uL (ref 0.00–0.07)
Basophils Absolute: 0.1 10*3/uL (ref 0.0–0.1)
Basophils Relative: 1 %
Eosinophils Absolute: 0.4 10*3/uL (ref 0.0–0.5)
Eosinophils Relative: 4 %
HCT: 36.2 % (ref 36.0–46.0)
Hemoglobin: 11.4 g/dL — ABNORMAL LOW (ref 12.0–15.0)
Immature Granulocytes: 0 %
Lymphocytes Relative: 13 %
Lymphs Abs: 1.2 10*3/uL (ref 0.7–4.0)
MCH: 29.3 pg (ref 26.0–34.0)
MCHC: 31.5 g/dL (ref 30.0–36.0)
MCV: 93.1 fL (ref 80.0–100.0)
Monocytes Absolute: 0.6 10*3/uL (ref 0.1–1.0)
Monocytes Relative: 7 %
Neutro Abs: 7.1 10*3/uL (ref 1.7–7.7)
Neutrophils Relative %: 75 %
Platelets: 170 10*3/uL (ref 150–400)
RBC: 3.89 MIL/uL (ref 3.87–5.11)
RDW: 14.2 % (ref 11.5–15.5)
WBC: 9.5 10*3/uL (ref 4.0–10.5)
nRBC: 0 % (ref 0.0–0.2)

## 2019-04-02 NOTE — ED Triage Notes (Signed)
Patient here with constipation.  Patient has been here multiple times for same.  Patient has history of controlled Afib.  Patient is CAOx4.  Patient was seen last on 03/20/19 and is in purple scrubs.

## 2019-04-03 MED ORDER — DOCUSATE SODIUM 100 MG PO CAPS: 100.0000 mg | ORAL_CAPSULE | Freq: Two times a day (BID) | ORAL | 0 refills | Status: DC

## 2019-04-03 NOTE — ED Notes (Signed)
[facility] arrived to transport patient. 

## 2019-04-03 NOTE — ED Notes (Signed)
Patient was soaked in urine changed pt. Briefs .changed gown .cleaned her up.placed two gowns on front and back,assit pt.back to her seat into the waiting room

## 2019-04-03 NOTE — Discharge Instructions (Addendum)
Constipation  You have evidence of constipation. Please follow the instructions below:  Hydration: You should start drinking at least ten, 8 oz glasses of water a day to help relieve your constipation. Baseline hydration should be at least eight, 8 oz glasses of water a day. This is a daily amount that you should be drinking, even without your current issue. Proper hydration not only helps prevent constipation, it is essential for any of the following treatments to be effective.  Fiber: Begin taking the fiber supplement daily. You should also increase the fiber in your diet.  Colace: Colace is a stool softener and can help facilitate softer bowel movements.  This medication may be taken daily.  MiraLAX: You may begin taking MiraLAX daily until you are having at least 1 soft bowel movement a day.  Follow-up: Follow-up with your primary care provider as soon as possible for continued management of this issue.  Return: Return to the ED should any symptoms worsen.  For prescription assistance, may try using prescription discount sites or apps, such as goodrx.com

## 2019-04-03 NOTE — ED Notes (Signed)
Patient verbalizes understanding of discharge instructions. Opportunity for questioning and answers were provided. Armband removed by staff, pt discharged from ED.  
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
## 2019-04-03 NOTE — ED Notes (Signed)
[facility] called to transport patient  

## 2019-04-08 ENCOUNTER — Encounter (HOSPITAL_COMMUNITY): Payer: Self-pay

## 2019-04-08 ENCOUNTER — Other Ambulatory Visit: Payer: Self-pay

## 2019-04-08 ENCOUNTER — Emergency Department (HOSPITAL_COMMUNITY)
Admission: EM | Admit: 2019-04-08 | Discharge: 2019-04-08 | Disposition: A | Discharge status: Home / Self Care | Payer: Medicare Other | Attending: Emergency Medicine | Admitting: Emergency Medicine

## 2019-04-08 DIAGNOSIS — K5900 Constipation, unspecified: Secondary | ICD-10-CM | POA: Diagnosis present

## 2019-04-08 DIAGNOSIS — E119 Type 2 diabetes mellitus without complications: Secondary | ICD-10-CM | POA: Insufficient documentation

## 2019-04-08 DIAGNOSIS — I5042 Chronic combined systolic (congestive) and diastolic (congestive) heart failure: Secondary | ICD-10-CM | POA: Insufficient documentation

## 2019-04-08 DIAGNOSIS — Z79899 Other long term (current) drug therapy: Secondary | ICD-10-CM | POA: Insufficient documentation

## 2019-04-08 DIAGNOSIS — Z7901 Long term (current) use of anticoagulants: Secondary | ICD-10-CM | POA: Insufficient documentation

## 2019-04-08 DIAGNOSIS — I2510 Atherosclerotic heart disease of native coronary artery without angina pectoris: Secondary | ICD-10-CM | POA: Diagnosis not present

## 2019-04-08 DIAGNOSIS — E876 Hypokalemia: Secondary | ICD-10-CM | POA: Diagnosis not present

## 2019-04-08 DIAGNOSIS — I252 Old myocardial infarction: Secondary | ICD-10-CM | POA: Insufficient documentation

## 2019-04-08 DIAGNOSIS — I110 Hypertensive heart disease with heart failure: Secondary | ICD-10-CM | POA: Diagnosis not present

## 2019-04-08 LAB — CBC WITH DIFFERENTIAL/PLATELET
Abs Immature Granulocytes: 0.03 10*3/uL (ref 0.00–0.07)
Basophils Absolute: 0.1 10*3/uL (ref 0.0–0.1)
Basophils Relative: 1 %
Eosinophils Absolute: 0.3 10*3/uL (ref 0.0–0.5)
Eosinophils Relative: 3 %
HCT: 35.7 % — ABNORMAL LOW (ref 36.0–46.0)
Hemoglobin: 11.1 g/dL — ABNORMAL LOW (ref 12.0–15.0)
Immature Granulocytes: 0 %
Lymphocytes Relative: 11 %
Lymphs Abs: 0.8 10*3/uL (ref 0.7–4.0)
MCH: 29.3 pg (ref 26.0–34.0)
MCHC: 31.1 g/dL (ref 30.0–36.0)
MCV: 94.2 fL (ref 80.0–100.0)
Monocytes Absolute: 0.5 10*3/uL (ref 0.1–1.0)
Monocytes Relative: 6 %
Neutro Abs: 6.2 10*3/uL (ref 1.7–7.7)
Neutrophils Relative %: 79 %
Platelets: 152 10*3/uL (ref 150–400)
RBC: 3.79 MIL/uL — ABNORMAL LOW (ref 3.87–5.11)
RDW: 14.4 % (ref 11.5–15.5)
WBC: 7.9 10*3/uL (ref 4.0–10.5)
nRBC: 0 % (ref 0.0–0.2)

## 2019-04-08 LAB — COMPREHENSIVE METABOLIC PANEL
ALT: 8 U/L (ref 0–44)
AST: 12 U/L — ABNORMAL LOW (ref 15–41)
Albumin: 4 g/dL (ref 3.5–5.0)
Alkaline Phosphatase: 51 U/L (ref 38–126)
Anion gap: 7 (ref 5–15)
BUN: 15 mg/dL (ref 8–23)
CO2: 26 mmol/L (ref 22–32)
Calcium: 8.7 mg/dL — ABNORMAL LOW (ref 8.9–10.3)
Chloride: 107 mmol/L (ref 98–111)
Creatinine, Ser: 1.05 mg/dL — ABNORMAL HIGH (ref 0.44–1.00)
GFR calc Af Amer: >60 mL/min (ref >60)
GFR calc non Af Amer: 55 mL/min — ABNORMAL LOW (ref >60)
Glucose, Bld: 137 mg/dL — ABNORMAL HIGH (ref 70–99)
Potassium: 3.4 mmol/L — ABNORMAL LOW (ref 3.5–5.1)
Sodium: 140 mmol/L (ref 135–145)
Total Bilirubin: 1.1 mg/dL (ref 0.3–1.2)
Total Protein: 7.3 g/dL (ref 6.5–8.1)

## 2019-04-08 LAB — MAGNESIUM: Magnesium: 2 mg/dL (ref 1.7–2.4)

## 2019-04-08 MED ORDER — MINERAL OIL RE ENEM
1.0000 | ENEMA | Freq: Once | RECTAL | Status: AC
  Administered 2019-04-08: 1 via RECTAL
  Filled 2019-04-08: qty 1

## 2019-04-08 MED ORDER — DOCUSATE SODIUM 100 MG PO CAPS: 100.0000 mg | ORAL_CAPSULE | Freq: Two times a day (BID) | ORAL | 0 refills | Status: DC

## 2019-04-08 MED ORDER — POTASSIUM CHLORIDE CRYS ER 20 MEQ PO TBCR
40.0000 meq | EXTENDED_RELEASE_TABLET | Freq: Once | ORAL | Status: DC
  Filled 2019-04-08: qty 2

## 2019-04-08 MED ORDER — SODIUM CHLORIDE 0.9 % IV BOLUS: 1000.0000 mL | Freq: Once | INTRAVENOUS | Status: DC

## 2019-04-08 MED ORDER — BISMUTH SUBSALICYLATE 262 MG PO CHEW
524.0000 mg | CHEWABLE_TABLET | Freq: Once | ORAL | Status: AC
  Administered 2019-04-08: 21:00:00 524 mg via ORAL
  Filled 2019-04-08: qty 2

## 2019-04-08 MED ORDER — POLYETHYLENE GLYCOL 3350 17 G PO PACK: 17.0000 g | PACK | Freq: Every day | ORAL | 0 refills | Status: DC

## 2019-04-08 NOTE — Discharge Instructions (Signed)
Return to the ER if you develop abdominal pain, vomiting, fever, or any other new/concerning symptoms.

## 2019-04-08 NOTE — ED Triage Notes (Addendum)
Pt BIB EMS from home. Pt reports constipation x1 month. Pt is in hospital gown from previous hospital visit. Pt reports not taking any of her medications. Pt lives at home alone. Pt A&O x4. Pt rolls around her house in an office chair and does not ambulate regularly. Pt is clearly not taking care of herself.
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
## 2019-04-08 NOTE — ED Notes (Signed)
[facility] called for transport home. 

## 2019-04-16 ENCOUNTER — Inpatient Hospital Stay (HOSPITAL_COMMUNITY)
Admission: EM | Admit: 2019-04-16 | Discharge: 2019-04-19 | DRG: 313 | Disposition: A | Discharge status: Home / Self Care | Payer: Medicare Other | Attending: Internal Medicine | Admitting: Internal Medicine

## 2019-04-16 DIAGNOSIS — E1169 Type 2 diabetes mellitus with other specified complication: Secondary | ICD-10-CM

## 2019-04-16 DIAGNOSIS — I4819 Other persistent atrial fibrillation: Secondary | ICD-10-CM | POA: Diagnosis present

## 2019-04-16 DIAGNOSIS — Z7984 Long term (current) use of oral hypoglycemic drugs: Secondary | ICD-10-CM

## 2019-04-16 DIAGNOSIS — K5900 Constipation, unspecified: Secondary | ICD-10-CM | POA: Diagnosis present

## 2019-04-16 DIAGNOSIS — R079 Chest pain, unspecified: Secondary | ICD-10-CM | POA: Diagnosis present

## 2019-04-16 DIAGNOSIS — I482 Chronic atrial fibrillation, unspecified: Secondary | ICD-10-CM

## 2019-04-16 DIAGNOSIS — I10 Essential (primary) hypertension: Secondary | ICD-10-CM | POA: Diagnosis present

## 2019-04-16 DIAGNOSIS — Z79899 Other long term (current) drug therapy: Secondary | ICD-10-CM

## 2019-04-16 DIAGNOSIS — Z800 Family history of malignant neoplasm of digestive organs: Secondary | ICD-10-CM

## 2019-04-16 DIAGNOSIS — Z820 Family history of epilepsy and other diseases of the nervous system: Secondary | ICD-10-CM

## 2019-04-16 DIAGNOSIS — Z801 Family history of malignant neoplasm of trachea, bronchus and lung: Secondary | ICD-10-CM

## 2019-04-16 DIAGNOSIS — I5022 Chronic systolic (congestive) heart failure: Secondary | ICD-10-CM | POA: Diagnosis present

## 2019-04-16 DIAGNOSIS — R0789 Other chest pain: Secondary | ICD-10-CM | POA: Diagnosis not present

## 2019-04-16 DIAGNOSIS — Z7901 Long term (current) use of anticoagulants: Secondary | ICD-10-CM

## 2019-04-16 DIAGNOSIS — Z880 Allergy status to penicillin: Secondary | ICD-10-CM

## 2019-04-16 DIAGNOSIS — I110 Hypertensive heart disease with heart failure: Secondary | ICD-10-CM | POA: Diagnosis present

## 2019-04-16 DIAGNOSIS — Z20828 Contact with and (suspected) exposure to other viral communicable diseases: Secondary | ICD-10-CM | POA: Diagnosis present

## 2019-04-16 DIAGNOSIS — Z833 Family history of diabetes mellitus: Secondary | ICD-10-CM

## 2019-04-16 DIAGNOSIS — Z9114 Patient's other noncompliance with medication regimen: Secondary | ICD-10-CM

## 2019-04-16 DIAGNOSIS — E119 Type 2 diabetes mellitus without complications: Secondary | ICD-10-CM | POA: Diagnosis present

## 2019-04-16 DIAGNOSIS — R197 Diarrhea, unspecified: Secondary | ICD-10-CM | POA: Diagnosis present

## 2019-04-16 NOTE — ED Triage Notes (Signed)
BIB [facility] from home with c/o of sudden onset palpitations and chest pressure starting approx 1 hr ago. Hx of Afib. Pt found to be in Afib with EMS. Received [address] PTA. Pt states she has not been taking any of her medications correctly.

## 2019-04-17 ENCOUNTER — Other Ambulatory Visit: Payer: Self-pay

## 2019-04-17 ENCOUNTER — Emergency Department (HOSPITAL_COMMUNITY): Payer: Medicare Other

## 2019-04-17 ENCOUNTER — Encounter (HOSPITAL_COMMUNITY): Payer: Self-pay | Admitting: Emergency Medicine

## 2019-04-17 ENCOUNTER — Observation Stay (HOSPITAL_BASED_OUTPATIENT_CLINIC_OR_DEPARTMENT_OTHER): Payer: Medicare Other

## 2019-04-17 DIAGNOSIS — I5022 Chronic systolic (congestive) heart failure: Secondary | ICD-10-CM

## 2019-04-17 DIAGNOSIS — I371 Nonrheumatic pulmonary valve insufficiency: Secondary | ICD-10-CM | POA: Diagnosis not present

## 2019-04-17 DIAGNOSIS — I10 Essential (primary) hypertension: Secondary | ICD-10-CM

## 2019-04-17 DIAGNOSIS — I361 Nonrheumatic tricuspid (valve) insufficiency: Secondary | ICD-10-CM | POA: Diagnosis not present

## 2019-04-17 DIAGNOSIS — R079 Chest pain, unspecified: Secondary | ICD-10-CM | POA: Diagnosis not present

## 2019-04-17 DIAGNOSIS — E1169 Type 2 diabetes mellitus with other specified complication: Secondary | ICD-10-CM | POA: Diagnosis not present

## 2019-04-17 DIAGNOSIS — I4819 Other persistent atrial fibrillation: Secondary | ICD-10-CM

## 2019-04-17 LAB — BASIC METABOLIC PANEL
Anion gap: 9 (ref 5–15)
BUN: 9 mg/dL (ref 8–23)
CO2: 23 mmol/L (ref 22–32)
Calcium: 8.5 mg/dL — ABNORMAL LOW (ref 8.9–10.3)
Chloride: 107 mmol/L (ref 98–111)
Creatinine, Ser: 0.89 mg/dL (ref 0.44–1.00)
GFR calc Af Amer: >60 mL/min (ref >60)
GFR calc non Af Amer: >60 mL/min (ref >60)
Glucose, Bld: 134 mg/dL — ABNORMAL HIGH (ref 70–99)
Potassium: 3.5 mmol/L (ref 3.5–5.1)
Sodium: 139 mmol/L (ref 135–145)

## 2019-04-17 LAB — ECHOCARDIOGRAM COMPLETE
Height: 70 in
Weight: 3343.94 oz

## 2019-04-17 LAB — SARS CORONAVIRUS 2 (TAT 6-24 HRS): SARS Coronavirus 2: NEGATIVE

## 2019-04-17 LAB — HEMOGLOBIN A1C
Hgb A1c MFr Bld: 5.7 % — ABNORMAL HIGH (ref 4.8–5.6)
Mean Plasma Glucose: 116.89 mg/dL

## 2019-04-17 LAB — GLUCOSE, CAPILLARY
Glucose-Capillary: 106 mg/dL — ABNORMAL HIGH (ref 70–99)
Glucose-Capillary: 110 mg/dL — ABNORMAL HIGH (ref 70–99)
Glucose-Capillary: 107 mg/dL — ABNORMAL HIGH (ref 70–99)

## 2019-04-17 LAB — TROPONIN I (HIGH SENSITIVITY)
Troponin I (High Sensitivity): 23 ng/L — ABNORMAL HIGH (ref <18)
Troponin I (High Sensitivity): 21 ng/L — ABNORMAL HIGH (ref <18)

## 2019-04-17 LAB — CBC
HCT: 39.2 % (ref 36.0–46.0)
Hemoglobin: 12.1 g/dL (ref 12.0–15.0)
MCH: 29.4 pg (ref 26.0–34.0)
MCHC: 30.9 g/dL (ref 30.0–36.0)
MCV: 95.4 fL (ref 80.0–100.0)
Platelets: 156 10*3/uL (ref 150–400)
RBC: 4.11 MIL/uL (ref 3.87–5.11)
RDW: 14.9 % (ref 11.5–15.5)
WBC: 9.6 10*3/uL (ref 4.0–10.5)
nRBC: 0 % (ref 0.0–0.2)

## 2019-04-17 LAB — HIV ANTIBODY (ROUTINE TESTING W REFLEX): HIV Screen 4th Generation wRfx: NONREACTIVE

## 2019-04-17 LAB — DIGOXIN LEVEL: Digoxin Level: <0.2 ng/mL — ABNORMAL LOW (ref 0.8–2.0)

## 2019-04-17 LAB — MAGNESIUM: Magnesium: 2 mg/dL (ref 1.7–2.4)

## 2019-04-17 LAB — BRAIN NATRIURETIC PEPTIDE: B Natriuretic Peptide: 221 pg/mL — ABNORMAL HIGH (ref 0.0–100.0)

## 2019-04-17 MED ORDER — METOPROLOL SUCCINATE ER 25 MG PO TB24
50.0000 mg | ORAL_TABLET | Freq: Once | ORAL | Status: AC
  Administered 2019-04-17: 50 mg via ORAL
  Filled 2019-04-17: qty 2

## 2019-04-17 MED ORDER — ACETAMINOPHEN 325 MG PO TABS
650.0000 mg | ORAL_TABLET | ORAL | Status: DC | PRN
  Administered 2019-04-18: 650 mg via ORAL
  Filled 2019-04-17: qty 2

## 2019-04-17 MED ORDER — DIGOXIN 125 MCG PO TABS
0.1250 mg | ORAL_TABLET | Freq: Every day | ORAL | Status: DC
  Administered 2019-04-17 – 2019-04-19 (×3): 0.125 mg via ORAL
  Filled 2019-04-17 (×3): qty 1

## 2019-04-17 MED ORDER — POTASSIUM CHLORIDE 20 MEQ/15ML (10%) PO SOLN
20.0000 meq | Freq: Every day | ORAL | Status: DC
  Administered 2019-04-17 – 2019-04-19 (×3): 20 meq via ORAL
  Filled 2019-04-17 (×3): qty 15

## 2019-04-17 MED ORDER — POTASSIUM CHLORIDE 20 MEQ/15ML (10%) PO SOLN
20.0000 meq | ORAL | Status: AC
  Administered 2019-04-17: 20 meq via ORAL
  Filled 2019-04-17: qty 15

## 2019-04-17 MED ORDER — ALUM & MAG HYDROXIDE-SIMETH 200-200-20 MG/5ML PO SUSP
30.0000 mL | Freq: Four times a day (QID) | ORAL | Status: DC | PRN
  Administered 2019-04-18 – 2019-04-19 (×3): 30 mL via ORAL
  Filled 2019-04-17 (×3): qty 30

## 2019-04-17 MED ORDER — FUROSEMIDE 40 MG PO TABS
40.0000 mg | ORAL_TABLET | Freq: Every day | ORAL | Status: DC
  Filled 2019-04-17 (×2): qty 1

## 2019-04-17 MED ORDER — APIXABAN 5 MG PO TABS
5.0000 mg | ORAL_TABLET | Freq: Two times a day (BID) | ORAL | Status: DC
  Administered 2019-04-17 – 2019-04-19 (×4): 5 mg via ORAL
  Filled 2019-04-17 (×5): qty 1

## 2019-04-17 MED ORDER — INSULIN ASPART 100 UNIT/ML ~~LOC~~ SOLN
0.0000 [IU] | Freq: Three times a day (TID) | SUBCUTANEOUS | Status: DC
  Administered 2019-04-18: 3 [IU] via SUBCUTANEOUS
  Administered 2019-04-19: 1 [IU] via SUBCUTANEOUS

## 2019-04-17 MED ORDER — ONDANSETRON HCL 4 MG/2ML IJ SOLN: 4.0000 mg | Freq: Four times a day (QID) | INTRAMUSCULAR | Status: DC | PRN

## 2019-04-17 MED ORDER — ENOXAPARIN SODIUM 40 MG/0.4ML ~~LOC~~ SOLN: 40.0000 mg | SUBCUTANEOUS | Status: DC

## 2019-04-17 MED ORDER — NITROGLYCERIN 0.4 MG SL SUBL
0.4000 mg | SUBLINGUAL_TABLET | SUBLINGUAL | Status: DC | PRN
  Administered 2019-04-18 (×2): 0.4 mg via SUBLINGUAL
  Filled 2019-04-17 (×2): qty 1

## 2019-04-17 MED ORDER — CALCIUM GLUCONATE-NACL 1-0.675 GM/50ML-% IV SOLN
1.0000 g | Freq: Once | INTRAVENOUS | Status: AC
  Administered 2019-04-17: 1000 mg via INTRAVENOUS
  Filled 2019-04-17: qty 50

## 2019-04-17 MED ORDER — METOPROLOL SUCCINATE ER 50 MG PO TB24
50.0000 mg | ORAL_TABLET | Freq: Two times a day (BID) | ORAL | Status: DC
  Administered 2019-04-17 – 2019-04-19 (×4): 50 mg via ORAL
  Filled 2019-04-17 (×4): qty 1

## 2019-04-17 NOTE — Care Management Obs Status (Signed)
[facility] NOTIFICATION   Patient Details  Name: Kaitlyn [name] MRN: [id number] Date of Birth: January 11, 1951   Medicare Observation Status Notification Given:  Yes    Marilu Favre, RN 04/17/2019, 1:09 PM
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
## 2019-04-17 NOTE — TOC Initial Note (Signed)
Transition of Care [facility]) - Initial/Assessment Note    Patient Details  Name: Kaitlyn [name] MRN: [id number] Date of Birth: 02/07/1951  [facility] CM/SW Contact:    Marilu Favre, RN Phone Number: 04/17/2019, 1:12 PM  Clinical Narrative:                 Confirmed face sheet information with patient at bedside. Patient from home alone. Patient states" I can't walk that good but I have a wheelchair." Patient's wheel chair does not fit through her bathroom door , however she transfers herself to a "desk chair with wheels" to get into bathroom. Patient requesting PTAR transportation home at discharge.   Noted   Documentation that she came back to hospital with hospital gown on and diaper saturated with urine. Patient insisting that she has help from neighbors and she is returning to home at discharge. When asked if she has ever had home health in past , she stated "no I don't want strangers in my home."  Expected Discharge Plan: Home/Self Care Barriers to Discharge: Continued Medical Work up   Patient Goals and CMS Choice Patient states their goals for this hospitalization and ongoing recovery are:: to go home CMS Medicare.gov Compare Post Acute Care list provided to:: Patient Choice offered to / list presented to : NA  Expected Discharge Plan and Services Expected Discharge Plan: Home/Self Care   Discharge Planning Services: CM Consult Post Acute Care Choice: NA Living arrangements for the past 2 months: Apartment                                      Prior Living Arrangements/Services Living arrangements for the past 2 months: Apartment Lives with:: Self Patient language and need for interpreter reviewed:: Yes Do you feel safe going back to the place where you live?: Yes      Need for Family Participation in Patient Care: Yes (Comment)(patient states she has neighbors who can help her) Care giver support system in place?: Yes (comment)   Criminal  Activity/Legal Involvement Pertinent to Current Situation/Hospitalization: No - Comment as needed  Activities of Daily Living Home Assistive Devices/Equipment: Wheelchair ADL Screening (condition at time of admission) Patient's cognitive ability adequate to safely complete daily activities?: Yes Is the patient deaf or have difficulty hearing?: No Does the patient have difficulty seeing, even when wearing glasses/contacts?: No Does the patient have difficulty concentrating, remembering, or making decisions?: No Patient able to express need for assistance with ADLs?: Yes Does the patient have difficulty dressing or bathing?: Yes Independently performs ADLs?: No Communication: Independent Dressing (OT): Needs assistance Is this a change from baseline?: Pre-admission baseline Grooming: Needs assistance Is this a change from baseline?: Pre-admission baseline Feeding: Independent Bathing: Needs assistance Is this a change from baseline?: Pre-admission baseline Toileting: Needs assistance Is this a change from baseline?: Pre-admission baseline In/Out Bed: Needs assistance Is this a change from baseline?: Pre-admission baseline Walks in Home: Needs assistance Is this a change from baseline?: Pre-admission baseline Does the patient have difficulty walking or climbing stairs?: Yes Weakness of Legs: Both Weakness of Arms/Hands: Both  Permission Sought/Granted   Permission granted to share information with : No              Emotional Assessment Appearance:: Appears older than stated age Attitude/Demeanor/Rapport: Guarded Affect (typically observed): Agitated Orientation: : Oriented to Self, Oriented to Place, Oriented to  Time,  Oriented to Situation Alcohol / Substance Use: Not Applicable Psych Involvement: No (comment)  Admission diagnosis:  Chronic atrial fibrillation (HCC) [I48.20] Left chest pressure [R07.89] Patient Active Problem List   Diagnosis Date Noted  . Positive  occult stool blood test 01/23/2019  . Advance care planning 01/04/2019  . Chronic systolic CHF (congestive heart failure) (HCC)   . Vitamin B 12 deficiency   . Neurocognitive deficits 11/01/2018  . Chronic diarrhea 10/16/2018  . Thrombocytopenia (HCC) 10/16/2018  . CAD (coronary artery disease) 10/16/2018  . Acute respiratory failure with hypoxia (HCC) 10/16/2018  . Generalized weakness 10/16/2018  . Anemia 11/23/2017  . NSTEMI (non-ST elevated myocardial infarction) (HCC) 01/09/2017  . Ischemic cardiomyopathy 01/09/2017  . Obesity, Class III, BMI 40-49.9 (morbid obesity) (HCC) 01/09/2017  . Chest pain   . Diarrhea   . Anaphylaxis 12/04/2015  . Hypotension due to hypovolemia 12/01/2015  . Leukocytosis   . Type 2 diabetes mellitus with hyperglycemia, with long-term current use of insulin (HCC)   . Lactic acidosis 07/07/2015  . Dehydration 07/07/2015  . Angioedema 07/07/2015  . Allergic reaction 07/07/2015  . Acute on chronic combined systolic and diastolic heart failure (HCC) 03/24/2015  . Atrial fibrillation with rapid ventricular response (HCC)   . Diabetes mellitus, without long-term current use of insulin (HCC)   . Thyroid nodule   . Seizures (HCC)   . Hypokalemia   . Hypomagnesemia   . Lymphadenopathy 09/24/2014  . Macrocytosis   . Essential hypertension   . Psychosocial impairment   . Acute on chronic systolic CHF (congestive heart failure) (HCC) 04/27/2014  . H/O noncompliance with medical treatment, presenting hazards to health 03/23/2014  . Chronic atrial fibrillation 03/23/2014   PCP:  Laurey Morale, MD Pharmacy:   [facility] - [place], [place] - [address] [place] [place] [address] Phone: [phone] Fax: [phone]     Social Determinants of Health (SDOH) Interventions    Readmission Risk Interventions Readmission Risk Prevention Plan 10/17/2018  Transportation Screening Complete  PCP or Specialist Appt within  3-5 Days Not Complete  Not Complete comments plan to d/c to SNF  HRI or Home Care Consult Complete  Social Work Consult for Recovery Care Planning/Counseling Complete  Palliative Care Screening Not Applicable  Medication Review Oceanographer) Complete  Some recent data might be hidden

## 2019-04-17 NOTE — Discharge Instructions (Addendum)

## 2019-04-17 NOTE — Progress Notes (Signed)
Patient arrived on [facility] from ED. Patient smelled strongly of urine, when RN and ED RN stood patient up pt had on 2 dippers that were both soaked with urine. Patient also had on the same gown that she had on since her last visit to the hospital. RN changed gown and cleaned up patient and placed a purwic on patient. Patient oriented to room with bed alarm on and call bell in reach.
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
## 2019-04-18 DIAGNOSIS — R0789 Other chest pain: Secondary | ICD-10-CM | POA: Diagnosis present

## 2019-04-18 DIAGNOSIS — E1169 Type 2 diabetes mellitus with other specified complication: Secondary | ICD-10-CM | POA: Diagnosis not present

## 2019-04-18 DIAGNOSIS — I4819 Other persistent atrial fibrillation: Secondary | ICD-10-CM | POA: Diagnosis present

## 2019-04-18 DIAGNOSIS — Z79899 Other long term (current) drug therapy: Secondary | ICD-10-CM | POA: Diagnosis not present

## 2019-04-18 DIAGNOSIS — Z7984 Long term (current) use of oral hypoglycemic drugs: Secondary | ICD-10-CM | POA: Diagnosis not present

## 2019-04-18 DIAGNOSIS — E119 Type 2 diabetes mellitus without complications: Secondary | ICD-10-CM | POA: Diagnosis present

## 2019-04-18 DIAGNOSIS — I482 Chronic atrial fibrillation, unspecified: Secondary | ICD-10-CM | POA: Diagnosis not present

## 2019-04-18 DIAGNOSIS — R079 Chest pain, unspecified: Secondary | ICD-10-CM | POA: Diagnosis not present

## 2019-04-18 DIAGNOSIS — Z9114 Patient's other noncompliance with medication regimen: Secondary | ICD-10-CM | POA: Diagnosis not present

## 2019-04-18 DIAGNOSIS — Z820 Family history of epilepsy and other diseases of the nervous system: Secondary | ICD-10-CM | POA: Diagnosis not present

## 2019-04-18 DIAGNOSIS — Z800 Family history of malignant neoplasm of digestive organs: Secondary | ICD-10-CM | POA: Diagnosis not present

## 2019-04-18 DIAGNOSIS — Z833 Family history of diabetes mellitus: Secondary | ICD-10-CM | POA: Diagnosis not present

## 2019-04-18 DIAGNOSIS — Z880 Allergy status to penicillin: Secondary | ICD-10-CM | POA: Diagnosis not present

## 2019-04-18 DIAGNOSIS — Z20828 Contact with and (suspected) exposure to other viral communicable diseases: Secondary | ICD-10-CM | POA: Diagnosis present

## 2019-04-18 DIAGNOSIS — I5022 Chronic systolic (congestive) heart failure: Secondary | ICD-10-CM | POA: Diagnosis present

## 2019-04-18 DIAGNOSIS — R197 Diarrhea, unspecified: Secondary | ICD-10-CM | POA: Diagnosis present

## 2019-04-18 DIAGNOSIS — I110 Hypertensive heart disease with heart failure: Secondary | ICD-10-CM | POA: Diagnosis present

## 2019-04-18 DIAGNOSIS — Z801 Family history of malignant neoplasm of trachea, bronchus and lung: Secondary | ICD-10-CM | POA: Diagnosis not present

## 2019-04-18 DIAGNOSIS — Z7901 Long term (current) use of anticoagulants: Secondary | ICD-10-CM | POA: Diagnosis not present

## 2019-04-18 DIAGNOSIS — K5900 Constipation, unspecified: Secondary | ICD-10-CM | POA: Diagnosis present

## 2019-04-18 LAB — GLUCOSE, CAPILLARY
Glucose-Capillary: 103 mg/dL — ABNORMAL HIGH (ref 70–99)
Glucose-Capillary: 117 mg/dL — ABNORMAL HIGH (ref 70–99)
Glucose-Capillary: 158 mg/dL — ABNORMAL HIGH (ref 70–99)
Glucose-Capillary: 120 mg/dL — ABNORMAL HIGH (ref 70–99)

## 2019-04-18 LAB — TROPONIN I (HIGH SENSITIVITY)
Troponin I (High Sensitivity): 16 ng/L (ref <18)
Troponin I (High Sensitivity): 15 ng/L (ref <18)

## 2019-04-18 MED ORDER — KETOROLAC TROMETHAMINE 15 MG/ML IJ SOLN
15.0000 mg | Freq: Once | INTRAMUSCULAR | Status: AC
  Administered 2019-04-18: 15 mg via INTRAVENOUS
  Filled 2019-04-18: qty 1

## 2019-04-18 MED ORDER — ASPIRIN 325 MG PO TABS
325.0000 mg | ORAL_TABLET | Freq: Every day | ORAL | Status: DC
  Filled 2019-04-18: qty 1

## 2019-04-18 MED ORDER — DOCUSATE SODIUM 100 MG PO CAPS
100.0000 mg | ORAL_CAPSULE | Freq: Two times a day (BID) | ORAL | Status: DC
  Administered 2019-04-18: 100 mg via ORAL
  Filled 2019-04-18 (×3): qty 1

## 2019-04-18 MED ORDER — MAGNESIUM CITRATE PO SOLN
1.0000 | Freq: Once | ORAL | Status: AC
  Administered 2019-04-18: 1 via ORAL
  Filled 2019-04-18: qty 296

## 2019-04-18 MED ORDER — ASPIRIN 325 MG PO TABS
325.0000 mg | ORAL_TABLET | Freq: Once | ORAL | Status: AC
  Administered 2019-04-18: 325 mg via ORAL
  Filled 2019-04-18: qty 1

## 2019-04-18 MED ORDER — ASPIRIN 325 MG PO TABS: 325.0000 mg | ORAL_TABLET | Freq: Every day | ORAL | Status: DC

## 2019-04-18 MED ORDER — HYDROXYZINE HCL 10 MG PO TABS
10.0000 mg | ORAL_TABLET | Freq: Four times a day (QID) | ORAL | Status: DC | PRN
  Administered 2019-04-18: 10 mg via ORAL
  Filled 2019-04-18: qty 1

## 2019-04-18 NOTE — Progress Notes (Signed)
Pt complaining of 8/10 chest pain. Unrelieved with nitro. States nitro "burns her tongue." Pt states when she was picked up by EMS, 324 Asprin given by EMS relieved chest pain. EKG performed. Provider paged and notified.

## 2019-04-18 NOTE — Progress Notes (Signed)
Pt verbalized having no chest pain after being given aspirin 325mg . Pt anxious and states "she feels like there is an issue with her afibb." Pt reassured that labs are stable and EKG shows baseline afibb.

## 2019-04-18 NOTE — TOC Benefit Eligibility Note (Signed)
Transition of Care [facility]) Benefit Eligibility Note    Patient Details  Name: Kaitlyn [name] MRN: [id number] Date of Birth: 1950-06-22   Medication/Dose: Eliquis 5 mg PO BID for 30 days  Covered?: Yes  Tier: 3 Drug  Prescription Coverage Preferred Pharmacy: [place]  [facility]  Spoke with Person/Company/Phone Number:: PQAES/[phone]/ [facility]  Co-Pay: 30 day supply 0 copay / Can not get a 90 day supply  Prior Approval: No  Deductible: Unmet       Orbie Pyo Phone Number: 04/18/2019, 2:12 PM

## 2019-04-18 NOTE — Progress Notes (Signed)
Progress Note    Kaitlyn [name]  ZOX:[id number] DOB: [birth year]  DOA: 04/16/2019 PCP: No primary care provider on file.    Brief Narrative:     Medical records reviewed and are as summarized below:  Kaitlyn [name] is an 68 y.o. female with medical history significant of HTN, systolic CHF, persistent atrial fibrillation, diabetes mellitus type 2, and history of noncompliance.  She presents with complaints of chest pain that started yesterday evening night while sitting in her chair.  Pain initially was located epigastrically and then seemed to go into her chest.  She thought symptoms were secondary to gas at first, but reports that when symptoms moved into her chest it felt more like pressure.  Not taking her medications at home as they gave her "diarrhea".  She has been fired by [employer] [facility] [employer] [facility] [employer] [employer] [facility] and was last seen by Dr. Shanon Brow.    Assessment/Plan:   Principal Problem:   Chest pain Active Problems:   Essential hypertension   Diabetes mellitus, without long-term current use of insulin (HCC)   Chronic systolic CHF (congestive heart failure) (HCC)   Persistent atrial fibrillation (HCC)   Chest pain, elevated troponin: Acute.   -Patient presents with complaints of left-sided chest pain.  Initial high-sensitivity troponin 23-> 21.  Suspect acute elevation could be secondary to to demand as patient was initially in atrial fibrillation with heart rates up to 114.   -Chest x-ray noting mild cardiomegaly without any signs of edema or infiltrate.  - Last LDL from 10/17/18 within normal limits at 45. -echocardiogram: EF has improved from 10/2018: 45-50% -has been fired from [employer] and has seen Dr. Shanon Brow in October 2019 in the hospital.  He recommended that she have a heart cath, she refused.  I discussed heart cath with her again and she adamantly said she would not want that.  All she wants are 4 baby ASA and IV cardizem  Persistent atrial fibrillation: On admission patient  seen to be in atrial fibrillation with heart rates up to 114. Patient had not been taking Eliquis, metoprolol, or digoxin.  Digoxin level <0.2. -Continue Eliquis, metoprolol, and digoxin -Continue to stress need of compliance with medications -HR controlled  Systolic CHF: Chronic.  BNP mildly elevated at 221 on admission.  Patient had not been taking her home medication of furosemide.  Chest x-ray otherwise clear.  Last EF noted to be 20 to 25% by echocardiogram on 10/17/2018. -Strict intake and output -Daily weight -Restart furosemide  Essential hypertension -Continue metoprolol -will not change meds as she has not been taking medications at home -encouraged compliance with mediations and follow up  Diabetes mellitus type 2: Patient had not been taking home oral medications of Januvia. Hbga1c  5.7. -SSI  Diarrhea and Constipation:  -needs bowel regimen and GI follow up   When I explained there was no indication for IV Cardizem as her HR was in the 80s and that eating 4 baby ASA every time she had chest pain could cause issues with her GI tract including GI bleeding: she screamed that I was stupid and to leave her room. As I was leaving, she continued to yell that I was "as stupid as the other ones and not to come back"  Chart reviewed, patient has a long history of refusing home health, not taking medications and not following up with her specialist/PCP. Has been seen by psychiatry in the past and deemed to have the capacity to make her medical decisions.  Family Communication/Anticipated D/C date and plan/Code Status   DVT prophylaxis: eliquis Code Status: Full Code.  Family Communication: none Disposition Plan:    Medical Consultants:    None.     Subjective:   Wants 4 baby asa and IV cardizem   Objective:    Vitals:   04/17/19 2347 04/18/19 0117 04/18/19 0547 04/18/19 1324  BP: (!) 173/97 (!) 150/78 (!) 176/90 (!) 151/95  Pulse: 64 68 88 87  Resp: 16 17  15 16   Temp: (!) 97.5 F (36.4 C)  97.8 F (36.6 C) 98.8 F (37.1 C)  TempSrc: Oral  Oral Oral  SpO2: 97%  97% 95%  Weight:   94.3 kg   Height:        Intake/Output Summary (Last 24 hours) at 04/18/2019 1328 Last data filed at 04/18/2019 0210 Gross per 24 hour  Intake 500 ml  Output 2100 ml  Net -1600 ml   Filed Weights   04/16/19 2359 04/17/19 0900 04/18/19 0547  Weight: 99.3 kg 94.8 kg 94.3 kg    Exam: In bed, appeared comfortable until I explained that I would not be giving her cardiazem in her IV and then she began to scream, did not appear winded at all.   Refused further evaluation  Data Reviewed:   I have personally reviewed following labs and imaging studies:  Labs: Labs show the following:   Basic Metabolic Panel: Recent Labs  Lab 04/17/19 0001 04/17/19 0022  NA 139  --   K 3.5  --   CL 107  --   CO2 23  --   GLUCOSE 134*  --   BUN 9  --   CREATININE 0.89  --   CALCIUM 8.5*  --   MG  --  2.0   GFR Estimated Creatinine Clearance: 75.3 mL/min (by C-G formula based on SCr of 0.89 mg/dL). Liver Function Tests: No results for input(s): AST, ALT, ALKPHOS, BILITOT, PROT, ALBUMIN in the last 168 hours. No results for input(s): LIPASE, AMYLASE in the last 168 hours. No results for input(s): AMMONIA in the last 168 hours. Coagulation profile No results for input(s): INR, PROTIME in the last 168 hours.  CBC: Recent Labs  Lab 04/17/19 0001  WBC 9.6  HGB 12.1  HCT 39.2  MCV 95.4  PLT 156   Cardiac Enzymes: No results for input(s): CKTOTAL, CKMB, CKMBINDEX, TROPONINI in the last 168 hours. BNP (last 3 results) No results for input(s): PROBNP in the last 8760 hours. CBG: Recent Labs  Lab 04/17/19 1118 04/17/19 1630 04/17/19 2120 04/18/19 0623 04/18/19 1107  GLUCAP 106* 110* 107* 103* 117*   D-Dimer: No results for input(s): DDIMER in the last 72 hours. Hgb A1c: Recent Labs    04/17/19 1007  HGBA1C 5.7*   Lipid Profile: No results for  input(s): CHOL, HDL, LDLCALC, TRIG, CHOLHDL, LDLDIRECT in the last 72 hours. Thyroid function studies: No results for input(s): TSH, T4TOTAL, T3FREE, THYROIDAB in the last 72 hours.  Invalid input(s): FREET3 Anemia work up: No results for input(s): VITAMINB12, FOLATE, FERRITIN, TIBC, IRON, RETICCTPCT in the last 72 hours. Sepsis Labs: Recent Labs  Lab 04/17/19 0001  WBC 9.6    Microbiology Recent Results (from the past 240 hour(s))  SARS CORONAVIRUS 2 (TAT 6-24 HRS) Nasopharyngeal Nasopharyngeal Swab     Status: None   Collection Time: 04/17/19 12:51 AM   Specimen: Nasopharyngeal Swab  Result Value Ref Range Status   SARS Coronavirus 2 NEGATIVE NEGATIVE Final  Comment: (NOTE) SARS-CoV-2 target nucleic acids are NOT DETECTED. The SARS-CoV-2 RNA is generally detectable in upper and lower respiratory specimens during the acute phase of infection. Negative results do not preclude SARS-CoV-2 infection, do not rule out co-infections with other pathogens, and should not be used as the sole basis for treatment or other patient management decisions. Negative results must be combined with clinical observations, patient history, and epidemiological information. The expected result is Negative. Fact Sheet for Patients: HairSlick.no Fact Sheet for Healthcare Providers: quierodirigir.com This test is not yet approved or cleared by the [place] FDA and  has been authorized for detection and/or diagnosis of SARS-CoV-2 by FDA under an Emergency Use Authorization (EUA). This EUA will remain  in effect (meaning this test can be used) for the duration of the COVID-19 declaration under Section 56 4(b)(1) of the Act, 21 U.S.C. section 360bbb-3(b)(1), unless the authorization is terminated or revoked sooner. Performed at [facility], [address], [place], [place] [address]     Procedures and diagnostic studies:  Dg Chest  Portable 1 View  Result Date: 04/17/2019 CLINICAL DATA:  Chest pain and shortness of breath EXAM: PORTABLE CHEST 1 VIEW COMPARISON:  December 23, 2018 FINDINGS: There is mild cardiomegaly. Aortic knob calcifications. Both lungs are clear. The visualized skeletal structures are unremarkable. IMPRESSION: No active disease. Electronically Signed   By: Jonna Clark M.D.   On: 04/17/2019 01:40    Medications:   . apixaban  5 mg Oral BID  . aspirin  325 mg Oral Daily  . digoxin  0.125 mg Oral Daily  . furosemide  40 mg Oral Daily  . insulin aspart  0-9 Units Subcutaneous TID WC  . metoprolol succinate  50 mg Oral BID  . potassium chloride  20 mEq Oral Daily   Continuous Infusions:   LOS: 0 days   Joseph Art  [facility]   How to contact the [facility] Attending or Consulting provider 7A - 7P or covering provider during after hours 7P -7A, for this patient?  1. Check the care team in [facility] and look for a) attending/consulting TRH provider listed and b) the [facility] team listed 2. Log into [email or web address] and use [place]'s universal password to access. If you do not have the password, please contact the hospital operator. 3. Locate the [facility] provider you are looking for under [facility] and page to a number that you can be directly reached. 4. If you still have difficulty reaching the provider, please page the [facility] (Director on Call) for the Hospitalists listed on amion for assistance.  04/18/2019, 1:28 PM

## 2019-04-19 LAB — GLUCOSE, CAPILLARY
Glucose-Capillary: 147 mg/dL — ABNORMAL HIGH (ref 70–99)
Glucose-Capillary: 126 mg/dL — ABNORMAL HIGH (ref 70–99)

## 2019-04-19 MED ORDER — ACETAMINOPHEN 160 MG/5ML PO SOLN
650.0000 mg | Freq: Four times a day (QID) | ORAL | Status: DC | PRN
  Administered 2019-04-19: 650 mg via ORAL
  Filled 2019-04-19: qty 20.3

## 2019-04-19 MED ORDER — DIGOXIN 125 MCG PO TABS: 0.1250 mg | ORAL_TABLET | Freq: Every day | ORAL | 0 refills | Status: DC

## 2019-04-19 MED ORDER — APIXABAN 5 MG PO TABS: 5.0000 mg | ORAL_TABLET | Freq: Two times a day (BID) | ORAL | 0 refills | Status: DC

## 2019-04-19 MED ORDER — POTASSIUM CHLORIDE 20 MEQ/15ML (10%) PO SOLN: 20.0000 meq | Freq: Every day | ORAL | 0 refills | Status: DC | PRN

## 2019-04-19 MED ORDER — ASPIRIN 81 MG PO CHEW
324.0000 mg | CHEWABLE_TABLET | Freq: Every day | ORAL | Status: DC
  Administered 2019-04-19: 324 mg via ORAL
  Filled 2019-04-19: qty 4

## 2019-04-19 MED ORDER — FUROSEMIDE 40 MG PO TABS: 40.0000 mg | ORAL_TABLET | Freq: Every day | ORAL | 1 refills | Status: DC | PRN

## 2019-04-19 MED ORDER — ACETAMINOPHEN 160 MG/5ML PO SOLN: 650.0000 mg | Freq: Four times a day (QID) | ORAL | 0 refills | Status: DC | PRN

## 2019-04-19 MED FILL — SM PAIN & FEVER CHILDRENS 1: 160 | 2 days supply | Qty: 118 | Fill #0

## 2019-04-19 MED FILL — ELIQUIS 5 MG TABLET: 5 | 30 days supply | Qty: 60 | Fill #0

## 2019-04-19 MED FILL — DIGOXIN 0.125 MG TABLET: 125 | 30 days supply | Qty: 30 | Fill #0

## 2019-04-19 NOTE — Discharge Summary (Addendum)
Physician Discharge Summary  Kaitlyn [name] WUX:[id number] DOB: 1950/09/11 DOA: 04/16/2019  PCP: No primary care provider on file.  Admit date: 04/16/2019 Discharge date: 04/19/2019  Admitted From: home Discharge disposition: home   Recommendations for Outpatient Follow-Up:   1. Concern for patient not being able to manage at home, prior to d/c was requesting food from the unit saying she had no food at home but refused any intervention from social work/care management: APS referral 2. Close outpatient followup- gave number for doctors making housecalls   Discharge Diagnosis:   Principal Problem:   Chest pain Active Problems:   Essential hypertension   Diabetes mellitus, without long-term current use of insulin (HCC)   Chronic systolic CHF (congestive heart failure) (HCC)   Persistent atrial fibrillation (HCC)   Medication nonadherence due to psychosocial problem    Discharge Condition: Improved.  Diet recommendation: Low sodium, heart healthy.  Carbohydrate-modified.  Wound care: None.  Code status: Full.   History of Present Illness:   Kaitlyn [name] is a 68 y.o. female with medical history significant of HTN, systolic CHF, persistent atrial fibrillation, diabetes mellitus type 2, and history of noncompliance.  She presents with complaints of chest pain that started yesterday evening night while sitting in her chair.  Pain initially was located epigastrically and then seemed to go into her chest.  She thought symptoms were secondary to gas at first, but reports that when symptoms moved into her chest it felt more like pressure.  Denies having any significant nausea, vomiting, diaphoresis, cough, or shortness of breath.  Over the last 2 months she has been dealing with diarrhea and then constipation.  She was just seen in the emergency department on 11/22 for constipation, where she received an enema with relief of symptoms.  During this time she also reports that  she had not taking her previously prescribed oral medications as she thought they were possibly the cause of her symptoms.  Also, she reports not having a primary care provider with whom did have medications represcribed.  She is followed by cardiology in the outpatient setting, but then states issues with getting to appointments due to lack of transportation.  She stresses that she needs to be home tomorrow 11 AM for inspections in her apartment otherwise she could press being affected.  In route with EMS patient had received 4 aspirin to chew with improvement in symptoms.   Hospital Course by Problem:   Chest pain, elevated troponin: Acute.  -Patient presents with complaints of left-sided chest pain. Initial high-sensitivity troponin 23->21. Suspect acute elevation could be secondary to to demand as patient was initially in atrial fibrillation with heart rates up to 114.  -Chest x-ray noting mild cardiomegaly without any signs of edema or infiltrate.  -Last LDL from 6/2/20within normal limits at 45. -echocardiogram: EF has improved from 10/2018: 45-50% -has been fired from [facility] and has seen Dr. Abby Potash in October 2019 in the hospital.  He recommended that she have a heart cath, she refused.  I discussed heart cath with her again and she adamantly said she would not want that.  All she wants are 4 baby ASA and IV cardizem  Persistent atrial fibrillation:On admission patient seen to be in atrial fibrillation with heart rates up to 114. Patient had not been taking Eliquis, metoprolol, or digoxin. Digoxin level <0.2. -ContinueEliquis,metoprolol,and digoxin -Continue to stress need of compliance with medications -HR controlled here while getting medications  Systolic OZD:GUYQIHK.BNP mildly elevated at 221  on admission.Patient had not been taking her home medication of furosemide. Chest x-ray otherwise clear. Last EF noted to be 20 to 25% by echocardiogram on  10/17/2018-- has improved -Strict intake and output -Daily weight -Restartfurosemide prn as patient refuses to take daily  Essential hypertension -Continue metoprolol -will not change meds as she has not been taking medications at home -encouraged compliance with mediations and follow up  Diabetes mellitus type 2: Patient had not been taking home oral medications of Januvia.Hbga1c 5.7.  Diarrhea and Constipation:  -needs bowel regimen and GI follow up for routine colonoscopy     Medical Consultants:      Discharge Exam:   Vitals:   04/19/19 0118 04/19/19 0635  BP:  (!) 144/89  Pulse: 81 73  Resp:    Temp:  (!) 97.4 F (36.3 C)  SpO2:  96%   Vitals:   04/18/19 1825 04/18/19 2354 04/19/19 0118 04/19/19 0635  BP: (!) 149/86 (!) 163/108  (!) 144/89  Pulse: 67 (!) 160 81 73  Resp: 16 17    Temp: (!) 97.4 F (36.3 C) 98 F (36.7 C)  (!) 97.4 F (36.3 C)  TempSrc: Oral Oral  Oral  SpO2: 97% 97%  96%  Weight:      Height:        General exam: gets fixated on 4 baby ASA being the answer to her problems-- difficult to re-direct  The results of significant diagnostics from this hospitalization (including imaging, microbiology, ancillary and laboratory) are listed below for reference.     Procedures and Diagnostic Studies:   Dg Chest Portable 1 View  Result Date: 04/17/2019 CLINICAL DATA:  Chest pain and shortness of breath EXAM: PORTABLE CHEST 1 VIEW COMPARISON:  December 23, 2018 FINDINGS: There is mild cardiomegaly. Aortic knob calcifications. Both lungs are clear. The visualized skeletal structures are unremarkable. IMPRESSION: No active disease. Electronically Signed   By: Jonna ClarkBindu  Avutu M.D.   On: 04/17/2019 01:40     Labs:   Basic Metabolic Panel: Recent Labs  Lab 04/17/19 0001 04/17/19 0022  NA 139  --   K 3.5  --   CL 107  --   CO2 23  --   GLUCOSE 134*  --   BUN 9  --   CREATININE 0.89  --   CALCIUM 8.5*  --   MG  --  2.0   GFR Estimated  Creatinine Clearance: 75.3 mL/min (by C-G formula based on SCr of 0.89 mg/dL). Liver Function Tests: No results for input(s): AST, ALT, ALKPHOS, BILITOT, PROT, ALBUMIN in the last 168 hours. No results for input(s): LIPASE, AMYLASE in the last 168 hours. No results for input(s): AMMONIA in the last 168 hours. Coagulation profile No results for input(s): INR, PROTIME in the last 168 hours.  CBC: Recent Labs  Lab 04/17/19 0001  WBC 9.6  HGB 12.1  HCT 39.2  MCV 95.4  PLT 156   Cardiac Enzymes: No results for input(s): CKTOTAL, CKMB, CKMBINDEX, TROPONINI in the last 168 hours. BNP: Invalid input(s): POCBNP CBG: Recent Labs  Lab 04/18/19 1107 04/18/19 1638 04/18/19 2144 04/19/19 0636 04/19/19 1115  GLUCAP 117* 158* 120* 147* 126*   D-Dimer No results for input(s): DDIMER in the last 72 hours. Hgb A1c Recent Labs    04/17/19 1007  HGBA1C 5.7*   Lipid Profile No results for input(s): CHOL, HDL, LDLCALC, TRIG, CHOLHDL, LDLDIRECT in the last 72 hours. Thyroid function studies No results for input(s): TSH, T4TOTAL, T3FREE, THYROIDAB  in the last 72 hours.  Invalid input(s): FREET3 Anemia work up No results for input(s): VITAMINB12, FOLATE, FERRITIN, TIBC, IRON, RETICCTPCT in the last 72 hours. Microbiology Recent Results (from the past 240 hour(s))  SARS CORONAVIRUS 2 (TAT 6-24 HRS) Nasopharyngeal Nasopharyngeal Swab     Status: None   Collection Time: 04/17/19 12:51 AM   Specimen: Nasopharyngeal Swab  Result Value Ref Range Status   SARS Coronavirus 2 NEGATIVE NEGATIVE Final    Comment: (NOTE) SARS-CoV-2 target nucleic acids are NOT DETECTED. The SARS-CoV-2 RNA is generally detectable in upper and lower respiratory specimens during the acute phase of infection. Negative results do not preclude SARS-CoV-2 infection, do not rule out co-infections with other pathogens, and should not be used as the sole basis for treatment or other patient management  decisions. Negative results must be combined with clinical observations, patient history, and epidemiological information. The expected result is Negative. Fact Sheet for Patients: HairSlick.no Fact Sheet for Healthcare Providers: quierodirigir.com This test is not yet approved or cleared by the [place] FDA and  has been authorized for detection and/or diagnosis of SARS-CoV-2 by FDA under an Emergency Use Authorization (EUA). This EUA will remain  in effect (meaning this test can be used) for the duration of the COVID-19 declaration under Section 56 4(b)(1) of the Act, 21 U.S.C. section 360bbb-3(b)(1), unless the authorization is terminated or revoked sooner. Performed at [facility], [address] [place], [place] [address]      Discharge Instructions:   Discharge Instructions    Diet - low sodium heart healthy   Complete by: As directed    Diet Carb Modified   Complete by: As directed    Discharge instructions   Complete by: As directed    Would recommend getting established with a family doctor ASAP (Doctors making housecalls: [phone])   Increase activity slowly   Complete by: As directed      Allergies as of 04/19/2019      Reactions   Caffeine Palpitations, Other (See Comments)   Seizure from large doses, heart races from small doses   Cranberry Shortness Of Breath, Diarrhea, Itching, Other (See Comments)   Severe headache.."Ocean Spray cranberry juice"   Lisinopril Diarrhea, Itching, Swelling   Site of swelling not recalled   Penicillins Other (See Comments)   Unknown childhood allergy Has patient had a PCN reaction causing immediate rash, facial/tongue/throat swelling, SOB or lightheadedness with hypotension: unknown Has patient had a PCN reaction causing severe rash involving mucus membranes or skin necrosis: unknown Has patient had a PCN reaction that required hospitalization  unknown Has patient had a PCN reaction occurring within the last 10 years: no If all of the above answers are "NO", then may proceed with Cephalosporin use.   Cranberry Juice Concentrate T[place]man Neat Extract]    Headaches   Other Other (See Comments)   Stimulants- patient has a past history of seizures      Medication List    TAKE these medications   acetaminophen 160 MG/5ML solution Commonly known as: TYLENOL Take 20.3 mLs (650 mg total) by mouth every 6 (six) hours as needed for mild pain.   apixaban 5 MG Tabs tablet Commonly known as: ELIQUIS Take 1 tablet (5 mg total) by mouth 2 (two) times daily.   digoxin 0.125 MG tablet Commonly known as: LANOXIN Take 1 tablet (0.125 mg total) by mouth daily.   docusate sodium 100 MG capsule Commonly known as: COLACE Take 1 capsule (100 mg  total) by mouth every 12 (twelve) hours.   furosemide 40 MG tablet Commonly known as: LASIX Take 1 tablet (40 mg total) by mouth daily as needed for fluid or edema (take your potassium when you take lasix). What changed:   when to take this  reasons to take this   metoprolol succinate 50 MG 24 hr tablet Commonly known as: TOPROL-XL Take 1 tablet (50 mg total) by mouth 2 (two) times daily. Take with or immediately following a meal.   polyethylene glycol 17 g packet Commonly known as: MIRALAX / GLYCOLAX Take 17 g by mouth daily.   potassium chloride 20 MEQ/15ML (10%) Soln Take 15 mLs (20 mEq total) by mouth daily as needed (when you take your lasix). What changed:   when to take this  reasons to take this   sitaGLIPtin 25 MG tablet Commonly known as: JANUVIA Take 1 tablet (25 mg total) by mouth daily.         Time coordinating discharge: 17 mi  Signed:  Geradine Girt DO  [facility] 04/19/2019, 4:34 PM

## 2019-04-19 NOTE — TOC Progression Note (Addendum)
Transition of Care [facility]) - Progression Note    Patient Details  Name: Kaitlyn [name] MRN: [id number] Date of Birth: [birth year]  Transition of Care [facility]) CM/SW [place], [place] Phone Number: 04/19/2019, 11:06 AM  Clinical Narrative:    11:06am- CSW acknowledging consult for APS referral. Patient has a long history of refusing home health, not taking medications and not following up with her specialist/PCP. Has been seen by psychiatry in the past and deemed to have the capacity to make her medical decisions per MD notes.   CSW will make referral when date of discharge is more clear.    Expected Discharge Plan: Home/Self Care Barriers to Discharge: Continued Medical Work up  Expected Discharge Plan and Services Expected Discharge Plan: Home/Self Care Discharge Planning Services: CM Consult Post Acute Care Choice: NA Living arrangements for the past 2 months: Apartment  Readmission Risk Interventions Readmission Risk Prevention Plan 10/17/2018  Transportation Screening Complete  PCP or Specialist Appt within 3-5 Days Not Complete  Not Complete comments plan to d/c to SNF  [place] or [place] Complete  Social Work Consult for [place] Planning/Counseling Complete  Palliative Care Screening Not Applicable  Medication Review Press photographer) Complete  Some recent data might be hidden

## 2019-04-19 NOTE — Progress Notes (Signed)
Discharge instructions reviewed with pt.  Copy of instructions and filled prescriptions given to pt.  PTAR transportation here and taking pt to her home.  Pt's belongings packed, instructions and her prescriptions placed in her belonging bag. Pt was provided with something to eat for her evening meal when she gets home as she requested.

## 2019-04-19 NOTE — Social Work (Signed)
APS report filed with [facility] through [facility] for [facility].   CSW signing off. Please consult if any additional needs arise.  [place], [place] Work [phone]

## 2019-04-19 NOTE — TOC Progression Note (Signed)
Transition of Care [facility]) - Progression Note    Patient Details  Name: Linzie Criss MRN: [id number] Date of Birth: Oct 07, 1950  Transition of Care [facility]) CM/SW Contact  Jacalyn Lefevre Edson Snowball, RN Phone Number: 04/19/2019, 2:02 PM  Clinical Narrative:     Patient requesting PTAR transportation home. Confirmed address with patient, she has keys to her apartment. Nurse requesting 1600 pick up. PTAR paperwork completed and called.  Expected Discharge Plan: Home/Self Care Barriers to Discharge: Continued Medical Work up  Expected Discharge Plan and Services Expected Discharge Plan: Home/Self Care   Discharge Planning Services: CM Consult Post Acute Care Choice: NA Living arrangements for the past 2 months: Apartment Expected Discharge Date: 04/19/19                                     Social Determinants of Health (SDOH) Interventions    Readmission Risk Interventions Readmission Risk Prevention Plan 10/17/2018  Transportation Screening Complete  PCP or Specialist Appt within 3-5 Days Not Complete  Not Complete comments plan to d/c to SNF  [place] or [place] Complete  Social Work Consult for [place] Planning/Counseling Complete  Palliative Care Screening Not Applicable  Medication Review Press photographer) Complete  Some recent data might be hidden

## 2019-05-28 ENCOUNTER — Other Ambulatory Visit (HOSPITAL_COMMUNITY): Payer: Self-pay | Admitting: Cardiology

## 2019-05-28 ENCOUNTER — Other Ambulatory Visit (HOSPITAL_COMMUNITY): Payer: Self-pay | Admitting: Internal Medicine

## 2019-05-28 ENCOUNTER — Telehealth (HOSPITAL_COMMUNITY): Payer: Self-pay | Admitting: Pharmacist

## 2019-05-28 ENCOUNTER — Other Ambulatory Visit: Payer: Self-pay | Admitting: Adult Health

## 2019-05-28 DIAGNOSIS — I5022 Chronic systolic (congestive) heart failure: Secondary | ICD-10-CM

## 2019-05-28 DIAGNOSIS — I482 Chronic atrial fibrillation, unspecified: Secondary | ICD-10-CM

## 2019-05-28 NOTE — Telephone Encounter (Signed)
Entered in error

## 2020-04-30 ENCOUNTER — Other Ambulatory Visit (HOSPITAL_COMMUNITY): Payer: Self-pay | Admitting: Emergency Medicine

## 2020-04-30 ENCOUNTER — Other Ambulatory Visit: Payer: Self-pay

## 2020-04-30 ENCOUNTER — Telehealth (HOSPITAL_COMMUNITY): Payer: Self-pay

## 2020-04-30 ENCOUNTER — Emergency Department (HOSPITAL_COMMUNITY)
Admission: EM | Admit: 2020-04-30 | Discharge: 2020-04-30 | Disposition: A | Discharge status: Home / Self Care | Payer: Medicare Other | Attending: Emergency Medicine | Admitting: Emergency Medicine

## 2020-04-30 DIAGNOSIS — E1165 Type 2 diabetes mellitus with hyperglycemia: Secondary | ICD-10-CM | POA: Insufficient documentation

## 2020-04-30 DIAGNOSIS — E876 Hypokalemia: Secondary | ICD-10-CM | POA: Diagnosis not present

## 2020-04-30 DIAGNOSIS — I5043 Acute on chronic combined systolic (congestive) and diastolic (congestive) heart failure: Secondary | ICD-10-CM | POA: Insufficient documentation

## 2020-04-30 DIAGNOSIS — R197 Diarrhea, unspecified: Secondary | ICD-10-CM | POA: Diagnosis not present

## 2020-04-30 DIAGNOSIS — I4892 Unspecified atrial flutter: Secondary | ICD-10-CM | POA: Diagnosis not present

## 2020-04-30 DIAGNOSIS — I110 Hypertensive heart disease with heart failure: Secondary | ICD-10-CM | POA: Diagnosis not present

## 2020-04-30 DIAGNOSIS — I4891 Unspecified atrial fibrillation: Secondary | ICD-10-CM | POA: Diagnosis not present

## 2020-04-30 DIAGNOSIS — I2510 Atherosclerotic heart disease of native coronary artery without angina pectoris: Secondary | ICD-10-CM | POA: Diagnosis not present

## 2020-04-30 LAB — CBC
HCT: 43.6 % (ref 36.0–46.0)
Hemoglobin: 14.2 g/dL (ref 12.0–15.0)
MCH: 32.9 pg (ref 26.0–34.0)
MCHC: 32.6 g/dL (ref 30.0–36.0)
MCV: 101.2 fL — ABNORMAL HIGH (ref 80.0–100.0)
Platelets: 167 10*3/uL (ref 150–400)
RBC: 4.31 MIL/uL (ref 3.87–5.11)
RDW: 15.6 % — ABNORMAL HIGH (ref 11.5–15.5)
WBC: 9.3 10*3/uL (ref 4.0–10.5)
nRBC: 0 % (ref 0.0–0.2)

## 2020-04-30 LAB — COMPREHENSIVE METABOLIC PANEL
ALT: 11 U/L (ref 0–44)
AST: 15 U/L (ref 15–41)
Albumin: 3.8 g/dL (ref 3.5–5.0)
Alkaline Phosphatase: 73 U/L (ref 38–126)
Anion gap: 11 (ref 5–15)
BUN: 16 mg/dL (ref 8–23)
CO2: 21 mmol/L — ABNORMAL LOW (ref 22–32)
Calcium: 10.1 mg/dL (ref 8.9–10.3)
Chloride: 108 mmol/L (ref 98–111)
Creatinine, Ser: 1.07 mg/dL — ABNORMAL HIGH (ref 0.44–1.00)
GFR, Estimated: 56 mL/min — ABNORMAL LOW (ref >60)
Glucose, Bld: 131 mg/dL — ABNORMAL HIGH (ref 70–99)
Potassium: 3.3 mmol/L — ABNORMAL LOW (ref 3.5–5.1)
Sodium: 140 mmol/L (ref 135–145)
Total Bilirubin: 1.4 mg/dL — ABNORMAL HIGH (ref 0.3–1.2)
Total Protein: 7.2 g/dL (ref 6.5–8.1)

## 2020-04-30 LAB — MAGNESIUM: Magnesium: 1.9 mg/dL (ref 1.7–2.4)

## 2020-04-30 MED ORDER — RIVAROXABAN 20 MG PO TABS: 20.0000 mg | ORAL_TABLET | Freq: Every day | ORAL | 3 refills | Status: DC

## 2020-04-30 MED ORDER — RIVAROXABAN 20 MG PO TABS
20.0000 mg | ORAL_TABLET | Freq: Every day | ORAL | Status: DC
  Administered 2020-04-30: 20 mg via ORAL
  Filled 2020-04-30: qty 1

## 2020-04-30 MED ORDER — LOPERAMIDE HCL 2 MG PO CAPS
4.0000 mg | ORAL_CAPSULE | Freq: Once | ORAL | Status: AC
  Administered 2020-04-30: 4 mg via ORAL
  Filled 2020-04-30: qty 2

## 2020-04-30 MED ORDER — POTASSIUM CHLORIDE CRYS ER 20 MEQ PO TBCR
40.0000 meq | EXTENDED_RELEASE_TABLET | Freq: Once | ORAL | Status: DC
  Filled 2020-04-30: qty 2

## 2020-04-30 MED ORDER — LOPERAMIDE HCL 2 MG PO CAPS: 4.0000 mg | ORAL_CAPSULE | Freq: Two times a day (BID) | ORAL | 0 refills | Status: DC | PRN

## 2020-04-30 MED ORDER — RIVAROXABAN (XARELTO) EDUCATION KIT FOR AFIB PATIENTS
PACK | Freq: Once | Status: AC
  Filled 2020-04-30: qty 1

## 2020-04-30 MED FILL — XARELTO 20 MG TABLET: 20 | 30 days supply | Qty: 30 | Fill #0

## 2020-04-30 MED FILL — LOPERAMIDE 2 MG CAPSULE: 2 | 4 days supply | Qty: 30 | Fill #0

## 2020-04-30 NOTE — Telephone Encounter (Signed)
Patient on ED report. Reached out to schedule ED f/u. Patient stated she has her own cardiologist.

## 2020-04-30 NOTE — ED Triage Notes (Signed)
Pt from home via [facility] for eval of diarrhea x3days, QID. Pt denies pain, blood in stools, fever, nausea, vomiting, but endorses liquid stools. Pt unable to tolerate much PO intake, states she is eating a slice of bread or crackers per meal due to fear of being unable to tolerate. Per pt, she does not have Imodium & is requesting same.  Hx htn, seizures, afib, noncompliant w medications due to lack of access/ funds  146/90  hr 60-70 & irregular

## 2020-04-30 NOTE — Discharge Instructions (Addendum)
You were seen in the ER for diarrhea. The work-up here shows that you have mildly low potassium which was replaced.   We have prescribed you some loperamide for symptom relief.  Ensure that you are hydrating yourself well.  We have initiated you on Xarelto in place of Eliquis at your request.  Follow-up with the primary care doctor or the cardiologist for further management of your A. fib.  Information on my medicine - XARELTO (Rivaroxaban)  Why was Xarelto prescribed for you? Xarelto was prescribed for you to reduce the risk of a blood clot forming that can cause a stroke if you have a medical condition called atrial fibrillation (a type of irregular heartbeat).  What do you need to know about xarelto ? Take your Xarelto ONCE DAILY at the same time every day with your evening meal. If you have difficulty swallowing the tablet whole, you may crush it and mix in applesauce just prior to taking your dose.  Take Xarelto exactly as prescribed by your doctor and DO NOT stop taking Xarelto without talking to the doctor who prescribed the medication.  Stopping without other stroke prevention medication to take the place of Xarelto may increase your risk of developing a clot that causes a stroke.  Refill your prescription before you run out.  After discharge, you should have regular check-up appointments with your healthcare provider that is prescribing your Xarelto.  In the future your dose may need to be changed if your kidney function or weight changes by a significant amount.  What do you do if you miss a dose? If you are taking Xarelto ONCE DAILY and you miss a dose, take it as soon as you remember on the same day then continue your regularly scheduled once daily regimen the next day. Do not take two doses of Xarelto at the same time or on the same day.   Important Safety Information A possible side effect of Xarelto is bleeding. You should call your healthcare provider right away if  you experience any of the following: Bleeding from an injury or your nose that does not stop. Unusual colored urine (red or dark brown) or unusual colored stools (red or black). Unusual bruising for unknown reasons. A serious fall or if you hit your head (even if there is no bleeding).  Some medicines may interact with Xarelto and might increase your risk of bleeding while on Xarelto. To help avoid this, consult your healthcare provider or pharmacist prior to using any new prescription or non-prescription medications, including herbals, vitamins, non-steroidal anti-inflammatory drugs (NSAIDs) and supplements.  This website has more information on Xarelto: VisitDestination.com.br.

## 2020-04-30 NOTE — ED Notes (Signed)
Patient verbalizes understanding of discharge instructions. Opportunity for questioning and answers were provided. Armband removed by staff, pt to be discharged from ED to home via PTAR.

## 2020-04-30 NOTE — ED Notes (Signed)
PTAR called @ 1023-per [place], RN called by Marylene Land
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
## 2020-04-30 NOTE — Progress Notes (Signed)
ANTICOAGULATION CONSULT NOTE - Initial Consult  Pharmacy Consult for Xarelto Indication: atrial fibrillation  Allergies  Allergen Reactions  . Caffeine Palpitations and Other (See Comments)    Seizure from large doses, heart races from small doses  . Cranberry Shortness Of Breath, Diarrhea, Itching and Other (See Comments)    Severe headache.."[place] cranberry juice"  . Lisinopril Diarrhea, Itching and Swelling    Site of swelling not recalled  . Penicillins Other (See Comments)    Unknown childhood allergy Has patient had a PCN reaction causing immediate rash, facial/tongue/throat swelling, SOB or lightheadedness with hypotension: unknown Has patient had a PCN reaction causing severe rash involving mucus membranes or skin necrosis: unknown Has patient had a PCN reaction that required hospitalization unknown Has patient had a PCN reaction occurring within the last 10 years: no If all of the above answers are "NO", then may proceed with Cephalosporin use.   . Cranberry Juice Concentrate [Cranberry Extract]     Headaches  . Other Other (See Comments)    Stimulants- patient has a past history of seizures    Patient Measurements:   Vital Signs: Temp: 98.9 F (37.2 C) (12/15 0335) Temp Source: Oral (12/15 0335) BP: 141/107 (12/15 0818) Pulse Rate: 111 (12/15 0818)  Labs: Recent Labs    04/30/20 0405  HGB 14.2  HCT 43.6  PLT 167  CREATININE 1.07*    CrCl cannot be calculated (Unknown ideal weight.).  Assessment: 69 yo female presented on 04/30/2020 with diarrhea. Pharmacy consulted to dose xarelto for Afib. Patient was previously on apixaban and reports stopping it months ago. Patient reports diarrhea with apixaban. No current anticoagulation prior to admission.   Goal of Therapy:  Monitor platelets by anticoagulation protocol: Yes   Plan:  Start rivaroxaban 20mg  daily  Pharmacy provided education to patient and will provide afib education booklet   , PharmD Clinical Pharmacist  04/30/2020,8:48 AM

## 2020-04-30 NOTE — Discharge Planning (Signed)
RNCM consulted regarding uninsured (Rx) pt requiring diarrhea and blood pressure Rx. RNCM suggested sending Rx to [facility]) for a price and we may be able to help with co-pay.  [facility] pharmacy ran Rx and there was no co-pay for Rx. TOC will deliver Rx to patient at bedside prior to discharge from hospital.

## 2020-05-21 ENCOUNTER — Telehealth (HOSPITAL_COMMUNITY): Payer: Self-pay

## 2020-05-21 NOTE — Telephone Encounter (Signed)
Pharmacy Transitions of Care Follow-up Telephone Call  Date of discharge: 04/30/20  Discharge Diagnosis: afib and diarrhea  How have you been since you were released from the hospital? Patient has been well and her diarrhea has resolved. She actually was having no afib issues that she was aware of and only wanted to be seen for her diarrhea.  Medication changes made at discharge: yes  Medication changes obtained and verified? yes    Medication Accessibility:  Patient has refills on xarelto at this time but she is not taking the drug nor did she want the Rx's transferred to another pharmacy. She was told to call the [place] outpatient pharmacy if that ever changes.  . Is the patient able to afford medications? Yes    Medication Review:  RIVAROXABAN Carlena Hurl) - patient is currently not taking Xarelto, they were discharged on it but she states that she doesn't need it as she is no longer in afib. Sh only gave the emergency doctor a past history of afib but was only wanting to be seen for diarrhea. She takes metoprolol as needed for any occasional chest pain. She was encouraged to both restart the medication or at least let her cardiologist know that she has stopped taking the medication.   Follow-up Appointments:  Patient has no follow ups scheduled at this time. She has no PCP and has not been to the cardiologist at [facility] (Dr. Shirlee Latch) in 2 years. She was encouraged to schedule at the very a least an annual appointment with her cardiologist to keep a check on her past afib.  If their condition worsens, is the pt aware to call PCP or go to the Emergency Dept.? Yes  Final Patient Assessment: patient states they are not taking xarelto, they only came to the emergency room for diarrhea and gave a past history of afib. She states she has not had chest pain or palpitations for at least a year and hasn't seen her cardiologist in 2 years. She takes metoprolol occasionally as needed. She would  not agree to restart the xarelto due to blood thinners giving her loose bowels but she was highly encouraged to see her cardiologist for an annual visit. Patient believes she is doing well at this time.

## 2020-06-16 ENCOUNTER — Emergency Department (HOSPITAL_COMMUNITY): Payer: Medicare (Managed Care)

## 2020-06-16 ENCOUNTER — Inpatient Hospital Stay (HOSPITAL_COMMUNITY)
Admission: EM | Admit: 2020-06-16 | Discharge: 2020-06-19 | DRG: 177 | Disposition: A | Discharge status: Home / Self Care | Payer: Medicare (Managed Care) | Attending: Internal Medicine | Admitting: Internal Medicine

## 2020-06-16 ENCOUNTER — Encounter (HOSPITAL_COMMUNITY): Payer: Self-pay | Admitting: *Deleted

## 2020-06-16 ENCOUNTER — Other Ambulatory Visit: Payer: Self-pay

## 2020-06-16 DIAGNOSIS — J1282 Pneumonia due to coronavirus disease 2019: Secondary | ICD-10-CM | POA: Diagnosis present

## 2020-06-16 DIAGNOSIS — Z7901 Long term (current) use of anticoagulants: Secondary | ICD-10-CM

## 2020-06-16 DIAGNOSIS — Z7984 Long term (current) use of oral hypoglycemic drugs: Secondary | ICD-10-CM

## 2020-06-16 DIAGNOSIS — I4819 Other persistent atrial fibrillation: Secondary | ICD-10-CM | POA: Diagnosis present

## 2020-06-16 DIAGNOSIS — E119 Type 2 diabetes mellitus without complications: Secondary | ICD-10-CM | POA: Diagnosis present

## 2020-06-16 DIAGNOSIS — U071 COVID-19: Secondary | ICD-10-CM | POA: Diagnosis present

## 2020-06-16 DIAGNOSIS — E1169 Type 2 diabetes mellitus with other specified complication: Secondary | ICD-10-CM

## 2020-06-16 DIAGNOSIS — I110 Hypertensive heart disease with heart failure: Secondary | ICD-10-CM | POA: Diagnosis present

## 2020-06-16 DIAGNOSIS — I4891 Unspecified atrial fibrillation: Secondary | ICD-10-CM | POA: Diagnosis present

## 2020-06-16 DIAGNOSIS — Z9114 Patient's other noncompliance with medication regimen: Secondary | ICD-10-CM

## 2020-06-16 DIAGNOSIS — Z79899 Other long term (current) drug therapy: Secondary | ICD-10-CM

## 2020-06-16 DIAGNOSIS — J9601 Acute respiratory failure with hypoxia: Secondary | ICD-10-CM | POA: Diagnosis present

## 2020-06-16 DIAGNOSIS — N179 Acute kidney failure, unspecified: Secondary | ICD-10-CM | POA: Diagnosis not present

## 2020-06-16 DIAGNOSIS — Z993 Dependence on wheelchair: Secondary | ICD-10-CM

## 2020-06-16 DIAGNOSIS — I5022 Chronic systolic (congestive) heart failure: Secondary | ICD-10-CM | POA: Diagnosis present

## 2020-06-16 DIAGNOSIS — R0602 Shortness of breath: Secondary | ICD-10-CM | POA: Diagnosis present

## 2020-06-16 LAB — BASIC METABOLIC PANEL
Anion gap: 12 (ref 5–15)
BUN: 14 mg/dL (ref 8–23)
CO2: 19 mmol/L — ABNORMAL LOW (ref 22–32)
Calcium: 8.4 mg/dL — ABNORMAL LOW (ref 8.9–10.3)
Chloride: 104 mmol/L (ref 98–111)
Creatinine, Ser: 1.12 mg/dL — ABNORMAL HIGH (ref 0.44–1.00)
GFR, Estimated: 53 mL/min — ABNORMAL LOW (ref >60)
Glucose, Bld: 132 mg/dL — ABNORMAL HIGH (ref 70–99)
Potassium: 3.7 mmol/L (ref 3.5–5.1)
Sodium: 135 mmol/L (ref 135–145)

## 2020-06-16 LAB — CBC
HCT: 41.6 % (ref 36.0–46.0)
Hemoglobin: 14.1 g/dL (ref 12.0–15.0)
MCH: 34.7 pg — ABNORMAL HIGH (ref 26.0–34.0)
MCHC: 33.9 g/dL (ref 30.0–36.0)
MCV: 102.5 fL — ABNORMAL HIGH (ref 80.0–100.0)
Platelets: 109 10*3/uL — ABNORMAL LOW (ref 150–400)
RBC: 4.06 MIL/uL (ref 3.87–5.11)
RDW: 15.6 % — ABNORMAL HIGH (ref 11.5–15.5)
WBC: 9.2 10*3/uL (ref 4.0–10.5)
nRBC: 0 % (ref 0.0–0.2)

## 2020-06-16 LAB — C-REACTIVE PROTEIN: CRP: 9.3 mg/dL — ABNORMAL HIGH (ref <1.0)

## 2020-06-16 LAB — FERRITIN: Ferritin: 69 ng/mL (ref 11–307)

## 2020-06-16 LAB — URINALYSIS, ROUTINE W REFLEX MICROSCOPIC
Bilirubin Urine: NEGATIVE
Glucose, UA: NEGATIVE mg/dL
Hgb urine dipstick: NEGATIVE
Ketones, ur: NEGATIVE mg/dL
Nitrite: NEGATIVE
Protein, ur: 30 mg/dL — AB
Specific Gravity, Urine: 1.01 (ref 1.005–1.030)
pH: 6 (ref 5.0–8.0)

## 2020-06-16 LAB — LACTATE DEHYDROGENASE: LDH: 180 U/L (ref 98–192)

## 2020-06-16 LAB — LACTIC ACID, PLASMA: Lactic Acid, Venous: 1.3 mmol/L (ref 0.5–1.9)

## 2020-06-16 LAB — SARS CORONAVIRUS 2 BY RT PCR (HOSPITAL ORDER, PERFORMED IN ~~LOC~~ HOSPITAL LAB): SARS Coronavirus 2: POSITIVE — AB

## 2020-06-16 LAB — APTT: aPTT: 41 seconds — ABNORMAL HIGH (ref 24–36)

## 2020-06-16 LAB — BRAIN NATRIURETIC PEPTIDE: B Natriuretic Peptide: 144.1 pg/mL — ABNORMAL HIGH (ref 0.0–100.0)

## 2020-06-16 LAB — GLUCOSE, CAPILLARY
Glucose-Capillary: 176 mg/dL — ABNORMAL HIGH (ref 70–99)
Glucose-Capillary: 197 mg/dL — ABNORMAL HIGH (ref 70–99)

## 2020-06-16 LAB — FIBRINOGEN: Fibrinogen: 488 mg/dL — ABNORMAL HIGH (ref 210–475)

## 2020-06-16 LAB — CBG MONITORING, ED: Glucose-Capillary: 112 mg/dL — ABNORMAL HIGH (ref 70–99)

## 2020-06-16 LAB — PHOSPHORUS: Phosphorus: 2.4 mg/dL — ABNORMAL LOW (ref 2.5–4.6)

## 2020-06-16 LAB — PROTIME-INR
INR: 1.1 (ref 0.8–1.2)
Prothrombin Time: 14.1 seconds (ref 11.4–15.2)

## 2020-06-16 LAB — DIGOXIN LEVEL: Digoxin Level: <0.2 ng/mL — ABNORMAL LOW (ref 0.8–2.0)

## 2020-06-16 LAB — MAGNESIUM: Magnesium: 2 mg/dL (ref 1.7–2.4)

## 2020-06-16 LAB — TSH: TSH: 1.346 u[IU]/mL (ref 0.350–4.500)

## 2020-06-16 LAB — D-DIMER, QUANTITATIVE: D-Dimer, Quant: 1.07 ug/mL-FEU — ABNORMAL HIGH (ref 0.00–0.50)

## 2020-06-16 LAB — PROCALCITONIN: Procalcitonin: 0.27 ng/mL

## 2020-06-16 MED ORDER — GUAIFENESIN-CODEINE 100-10 MG/5ML PO SOLN
10.0000 mL | Freq: Four times a day (QID) | ORAL | Status: DC | PRN
  Administered 2020-06-16 – 2020-06-18 (×3): 10 mL via ORAL
  Filled 2020-06-16 (×3): qty 10

## 2020-06-16 MED ORDER — ACETAMINOPHEN 325 MG PO TABS: 650.0000 mg | ORAL_TABLET | Freq: Four times a day (QID) | ORAL | Status: DC | PRN

## 2020-06-16 MED ORDER — SODIUM CHLORIDE 0.9 % IV SOLN
200.0000 mg | Freq: Once | INTRAVENOUS | Status: AC
  Administered 2020-06-16: 200 mg via INTRAVENOUS
  Filled 2020-06-16: qty 40
  Filled 2020-06-16: qty 200

## 2020-06-16 MED ORDER — DILTIAZEM HCL-DEXTROSE 125-5 MG/125ML-% IV SOLN (PREMIX)
5.0000 mg/h | INTRAVENOUS | Status: DC
  Administered 2020-06-16 (×2): 5 mg/h via INTRAVENOUS
  Filled 2020-06-16 (×2): qty 125

## 2020-06-16 MED ORDER — DIGOXIN 125 MCG PO TABS
0.1250 mg | ORAL_TABLET | Freq: Every day | ORAL | Status: DC
  Administered 2020-06-16 – 2020-06-18 (×3): 0.125 mg via ORAL
  Filled 2020-06-16 (×4): qty 1

## 2020-06-16 MED ORDER — NYSTATIN 100000 UNIT/GM EX POWD
Freq: Once | CUTANEOUS | Status: DC
  Filled 2020-06-16: qty 15

## 2020-06-16 MED ORDER — METOPROLOL SUCCINATE ER 50 MG PO TB24
50.0000 mg | ORAL_TABLET | Freq: Two times a day (BID) | ORAL | Status: DC
  Administered 2020-06-16 – 2020-06-18 (×5): 50 mg via ORAL
  Filled 2020-06-16 (×6): qty 1

## 2020-06-16 MED ORDER — SODIUM CHLORIDE 0.9 % IV SOLN
100.0000 mg | Freq: Every day | INTRAVENOUS | Status: DC
  Administered 2020-06-17 – 2020-06-19 (×3): 100 mg via INTRAVENOUS
  Filled 2020-06-16 (×3): qty 20

## 2020-06-16 MED ORDER — INSULIN ASPART 100 UNIT/ML ~~LOC~~ SOLN
0.0000 [IU] | Freq: Three times a day (TID) | SUBCUTANEOUS | Status: DC
  Administered 2020-06-16: 2 [IU] via SUBCUTANEOUS
  Administered 2020-06-17: 3 [IU] via SUBCUTANEOUS
  Administered 2020-06-17: 2 [IU] via SUBCUTANEOUS
  Administered 2020-06-17: 5 [IU] via SUBCUTANEOUS
  Administered 2020-06-18 (×2): 3 [IU] via SUBCUTANEOUS
  Administered 2020-06-18: 5 [IU] via SUBCUTANEOUS
  Administered 2020-06-19 (×2): 3 [IU] via SUBCUTANEOUS

## 2020-06-16 MED ORDER — METHYLPREDNISOLONE SODIUM SUCC 40 MG IJ SOLR
40.0000 mg | Freq: Three times a day (TID) | INTRAMUSCULAR | Status: DC
  Administered 2020-06-16 – 2020-06-19 (×8): 40 mg via INTRAVENOUS
  Filled 2020-06-16 (×9): qty 1

## 2020-06-16 MED ORDER — DILTIAZEM LOAD VIA INFUSION
10.0000 mg | Freq: Once | INTRAVENOUS | Status: AC
  Administered 2020-06-16: 10 mg via INTRAVENOUS
  Filled 2020-06-16: qty 10

## 2020-06-16 MED ORDER — LINAGLIPTIN 5 MG PO TABS
5.0000 mg | ORAL_TABLET | Freq: Every day | ORAL | Status: DC
  Administered 2020-06-16 – 2020-06-19 (×4): 5 mg via ORAL
  Filled 2020-06-16 (×5): qty 1

## 2020-06-16 MED ORDER — DEXAMETHASONE 6 MG PO TABS
6.0000 mg | ORAL_TABLET | Freq: Every day | ORAL | Status: DC
  Administered 2020-06-16: 6 mg via ORAL
  Filled 2020-06-16 (×2): qty 2

## 2020-06-16 MED ORDER — RIVAROXABAN 20 MG PO TABS
20.0000 mg | ORAL_TABLET | Freq: Every day | ORAL | Status: DC
  Administered 2020-06-16: 20 mg via ORAL
  Filled 2020-06-16 (×2): qty 1

## 2020-06-16 MED ORDER — LOPERAMIDE HCL 2 MG PO CAPS
4.0000 mg | ORAL_CAPSULE | Freq: Two times a day (BID) | ORAL | Status: DC | PRN
  Administered 2020-06-16: 4 mg via ORAL
  Filled 2020-06-16: qty 2

## 2020-06-16 MED ORDER — PANTOPRAZOLE SODIUM 40 MG PO TBEC
40.0000 mg | DELAYED_RELEASE_TABLET | Freq: Every day | ORAL | Status: DC
  Administered 2020-06-16 – 2020-06-19 (×4): 40 mg via ORAL
  Filled 2020-06-16 (×4): qty 1

## 2020-06-16 MED ORDER — ACETAMINOPHEN 650 MG RE SUPP: 650.0000 mg | Freq: Four times a day (QID) | RECTAL | Status: DC | PRN

## 2020-06-16 NOTE — ED Notes (Signed)
Niece # [phone]

## 2020-06-16 NOTE — Progress Notes (Signed)
PROGRESS NOTE                                                                             PROGRESS NOTE                                                                                                                                                                                                             Patient Demographics:    Kaitlyn [name], is a 70 y.o. female, DOB - July 26, 1950, ZWC:[id number]  Outpatient Primary MD for the patient is Pcp, No    LOS - 0  Admit date - 06/16/2020    Chief Complaint  Patient presents with  . Cough  . Shortness of Breath       Brief Narrative    Is a no charge note as patient was seen and admitted earlier today, chart, imaging and labs were reviewed, patient was seen and examined.  70 y.o. female with medical history significant for chronic systolic heart failure with most recent echocardiogram December 2020 showing LVEF 45 to 50%, paroxysmal atrial fibrillation prescribed chronic anticoagulation on Xarelto, type 2 diabetes mellitus,admitted to [facility] on 06/16/2020 with  COVID-19 infection  and A. fib with RVR.   Subjective:    Kaitlyn [name] today reports no change in her dyspnea, still reports cough, denies any chest pain .   Assessment  & Plan :    Principal Problem:   COVID-19 virus infection Active Problems:   Diabetes mellitus, without long-term current use of insulin (HCC)   Atrial fibrillation with RVR (HCC)   Chronic systolic CHF (congestive heart failure) (HCC)   SOB (shortness of breath)  Acute Hypoxic Resp. Failure due to Acute Covid 19 Viral Pneumonitis during the ongoing 2020 Covid 19 Pandemic  -She is unvaccinated. -She is on 3.5 L nasal cannula. -Continue with IV Solu-Medrol -Continue with remdesivir -Continue to trend inflammatory markers dimers  Encouraged the patient to sit up in chair in the daytime use I-S and flutter valve for pulmonary toiletry and then prone  in  bed when at night.  Will advance activity and titrate down oxygen as possible.  Actemra/Baricitinib  off label use - patient was told that if COVID-19 pneumonitis gets worse we might potentially use Actemra off label, patient denies any known history of active diverticulitis, tuberculosis or hepatitis, understands the risks and benefits and wants to proceed with Actemra treatment if required.     SpO2: 98 % O2 Flow Rate (L/min): 3 L/min  Recent Labs  Lab 06/16/20 0418 06/16/20 0420 06/16/20 0512  WBC 9.2  --   --   PLT 109*  --   --   BNP 144.1*  --   --   INR 1.1  --   --   LATICACIDVEN  --   --  1.3  SARSCOV2NAA  --  POSITIVE*  --        ABG     Component Value Date/Time   HCO3 22.2 10/09/2018 1921   TCO2 22 11/30/2015 2305   ACIDBASEDEF 2.3 (H) 10/09/2018 1921   O2SAT 72.5 10/09/2018 1921   A. fib with RVR: Most recent echocardiogram performed in December 2020 LVEF 45 to 50%. -Currently on Cardizem drip for heart rate control, will resume on home medication metoprolol . -  CHA2DS2-VASc score of 6, continue Xarelto for anticoagulation .     #) Type 2 diabetes mellitus: On Januvia as an outpatient.  Presenting blood sugar per presenting BMP noted to be 132.  Plan: Hold home Januvia and Actos hospitalization.  Accu-Cheks before every meal and at bedtime with low-dose sliding scale insulin have been ordered.  Linagliptin has component of management for presenting severe COVID-19 infection, as above.     #) Chronic systolic heart failure: With most recent echocardiogram in December 2020 showing LVEF of 45 to 50%, with additional details as provided above.  No clinical or radiographic evidence of acute decompensated heart failure, and [place] of presentation involving atrial fibrillation with RVR.  Will closely monitor for ensuing development of decompensated heart failure given this presentation involving A. fib with RVR.  It appears that the patient is prescribed  as needed Lasix, although the patient reports that she has never taken this medication.  Plan: Monitor strict I's and O's and daily weights.  Repeat BMP in the morning.  Add on serum magnesium level.  Monitor on telemetry.  Monitor Accu-Cheks pulse oximetry.  Counseled the patient on the importance of improved compliance with her outpatient cardiac regimen, including Toprol-XL given that A. fib RVR we predispose the patient to development of acutely decompensated heart failure.         Condition - Extremely Guarded  Code Status :  full  Consults  :  none  Procedures  :  none  Disposition Plan  :    Status is: Inpatient  Remains inpatient appropriate because:IV treatments appropriate due to intensity of illness or inability to take PO   Dispo: The patient is from: Home              Anticipated d/c is to: Home              Anticipated d/c date is: > 3 days              Patient currently is not medically stable to d/c.   Difficult to place patient No      DVT Prophylaxis  :  Xarelto  Lab Results  Component Value Date   PLT 109 (L) 06/16/2020    Diet :  Diet Order            Diet regular Room service appropriate? Yes; Fluid consistency: Thin  Diet effective now                  Inpatient Medications  Scheduled Meds: . dexamethasone  6 mg Oral Daily  . digoxin  0.125 mg Oral Daily  . insulin aspart  0-9 Units Subcutaneous TID WC  . linagliptin  5 mg Oral Daily  . metoprolol succinate  50 mg Oral BID  . nystatin   Topical Once  . rivaroxaban  20 mg Oral Q supper   Continuous Infusions: . diltiazem (CARDIZEM) infusion 5 mg/hr (06/16/20 1350)  . [START ON 06/17/2020] remdesivir 100 mg in NS 100 mL     PRN Meds:.acetaminophen **OR** acetaminophen, loperamide  Antibiotics  :    Anti-infectives (From admission, onward)   Start     Dose/Rate Route Frequency Ordered Stop   06/17/20 1000  remdesivir 100 mg in sodium chloride 0.9 % 100 mL IVPB        "Followed by" Linked Group Details   100 mg 200 mL/hr over 30 Minutes Intravenous Daily 06/16/20 0652 06/21/20 0959   06/16/20 0700  remdesivir 200 mg in sodium chloride 0.9% 250 mL IVPB       "Followed by" Linked Group Details   200 mg 580 mL/hr over 30 Minutes Intravenous Once 06/16/20 0240 06/16/20 1144        Leata Dominy M.D on 06/16/2020 at 3:52 PM  To page go to [email or web address]   [facility] -  Office  [phone]      Objective:   Vitals:   06/16/20 1109 06/16/20 1115 06/16/20 1405 06/16/20 1524  BP: 91/68 107/66 138/86 123/71  Pulse: 95 88 89 94  Resp: (!) 24 19 17 18   Temp:    98.6 F (37 C)  TempSrc:    Oral  SpO2: 97% 97% 94% 98%  Weight:      Height:        Wt Readings from Last 3 Encounters:  06/16/20 81.6 kg  04/30/20 90.7 kg  04/18/19 94.3 kg     Intake/Output Summary (Last 24 hours) at 06/16/2020 1552 Last data filed at 06/16/2020 1144 Gross per 24 hour  Intake 250 ml  Output -  Net 250 ml     Physical Exam  Awake Alert, No new F.N deficits, Normal affect Symmetrical Chest wall movement, Good air movement bilaterally, CTAB RRR,No Gallops,Rubs or new Murmurs, No Parasternal Heave +ve B.Sounds, Abd Soft, No tenderness, No rebound - guarding or rigidity. No Cyanosis, Clubbing or edema, No new Rash or bruise      Data Review:    CBC Recent Labs  Lab 06/16/20 0418  WBC 9.2  HGB 14.1  HCT 41.6  PLT 109*  MCV 102.5*  MCH 34.7*  MCHC 33.9  RDW 15.6*    Recent Labs  Lab 06/16/20 0418 06/16/20 0506 06/16/20 0512  NA 135  --   --   K 3.7  --   --   CL 104  --   --   CO2 19*  --   --   GLUCOSE 132*  --   --   BUN 14  --   --   CREATININE 1.12*  --   --   CALCIUM 8.4*  --   --   LATICACIDVEN  --   --  1.3  INR 1.1  --   --  TSH  --  1.346  --   BNP 144.1*  --   --     ------------------------------------------------------------------------------------------------------------------ No results for input(s):  CHOL, HDL, LDLCALC, TRIG, CHOLHDL, LDLDIRECT in the last 72 hours.  Lab Results  Component Value Date   HGBA1C 5.7 (H) 04/17/2019   ------------------------------------------------------------------------------------------------------------------ Recent Labs    06/16/20 0506  TSH 1.346    Cardiac Enzymes No results for input(s): CKMB, TROPONINI, MYOGLOBIN in the last 168 hours.  Invalid input(s): CK ------------------------------------------------------------------------------------------------------------------    Component Value Date/Time   BNP 144.1 (H) 06/16/2020 0418    Micro Results Recent Results (from the past 240 hour(s))  SARS Coronavirus 2 by RT PCR (hospital order, performed in [facility] hospital lab) Nasopharyngeal Nasopharyngeal Swab     Status: Abnormal   Collection Time: 06/16/20  4:20 AM   Specimen: Nasopharyngeal Swab  Result Value Ref Range Status   SARS Coronavirus 2 POSITIVE (A) NEGATIVE Final    Comment: RESULT CALLED TO, READ BACK BY AND VERIFIED WITH: Caprice Beaver 1540 06/16/2020 T. TYSOR (NOTE) SARS-CoV-2 target nucleic acids are DETECTED  SARS-CoV-2 RNA is generally detectable in upper respiratory specimens  during the acute phase of infection.  Positive results are indicative  of the presence of the identified virus, but do not rule out bacterial infection or co-infection with other pathogens not detected by the test.  Clinical correlation with patient history and  other diagnostic information is necessary to determine patient infection status.  The expected result is negative.  Fact Sheet for Patients:   BoilerBrush.com.cy   Fact Sheet for Healthcare Providers:   https://pope.com/    This test is not yet approved or cleared by the [place] FDA and  has been authorized for detection and/or diagnosis of SARS-CoV-2 by FDA under an Emergency Use Authorization (EUA).  This EUA  will remain in effect (meaning th is test can be used) for the duration of  the COVID-19 declaration under Section 564(b)(1) of the Act, 21 U.S.C. section 360-bbb-3(b)(1), unless the authorization is terminated or revoked sooner.  Performed at [facility], [address] [place], [place] [address]   Blood culture (routine single)     Status: None (Preliminary result)   Collection Time: 06/16/20  4:48 AM   Specimen: BLOOD  Result Value Ref Range Status   Specimen Description BLOOD RIGHT WRIST  Final   Special Requests   Final    BOTTLES DRAWN AEROBIC AND ANAEROBIC Blood Culture adequate volume   Culture   Final    NO GROWTH < 12 HOURS Performed at [facility], [address] [place], [place] [address]    Report Status PENDING  Incomplete    Radiology Reports DG Chest Portable 1 View  Result Date: 06/16/2020 CLINICAL DATA:  Shortness of breath EXAM: PORTABLE CHEST 1 VIEW COMPARISON:  Chest x-ray 04/17/2019 FINDINGS: The heart size and mediastinal contours are unchanged with persistent cardiomegaly. Aortic arch calcification. Bibasilar atelectasis. No focal consolidation. No pulmonary edema. No pleural effusion. No pneumothorax. No acute osseous abnormality. IMPRESSION: No active disease. Electronically Signed   By: Tish Frederickson M.D.   On: 06/16/2020 04:57
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
## 2020-06-16 NOTE — Progress Notes (Signed)
Patient admitted to [facility] from ED. Patient is alert and oriented x4. Vital signs are stable and she is on 3L of oxygen. Has no complaints of pain. Skin is intact, she has some MASD under both breasts and what looks like a rash on the back of both thighs. Patient also has very matted hair. Patient belongings at bedside (tablet, phone, glasses). The patient was shown how to use the call bell. Call bell, phone and bedside table are within reach; bed is in the lowest position.

## 2020-06-16 NOTE — ED Notes (Signed)
Pt requesting Imodium. RN to also inquire about transition off dilt drip. MD [place] paged

## 2020-06-16 NOTE — ED Triage Notes (Signed)
Patient presents to ED via [facility] c/o cough and sorethroat x 6 days , states she thought it would get better however its not getting better , only complaint is her sorethroat and cough. Patient smells strong of urine , states she wears adult diapers and always urinates freg.

## 2020-06-16 NOTE — ED Notes (Signed)
Attempted report x1. 
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
## 2020-06-17 ENCOUNTER — Other Ambulatory Visit: Payer: Self-pay

## 2020-06-17 LAB — D-DIMER, QUANTITATIVE: D-Dimer, Quant: 1.08 ug/mL-FEU — ABNORMAL HIGH (ref 0.00–0.50)

## 2020-06-17 LAB — CBC
HCT: 40.8 % (ref 36.0–46.0)
Hemoglobin: 13.6 g/dL (ref 12.0–15.0)
MCH: 34.3 pg — ABNORMAL HIGH (ref 26.0–34.0)
MCHC: 33.3 g/dL (ref 30.0–36.0)
MCV: 102.8 fL — ABNORMAL HIGH (ref 80.0–100.0)
Platelets: 101 10*3/uL — ABNORMAL LOW (ref 150–400)
RBC: 3.97 MIL/uL (ref 3.87–5.11)
RDW: 15.3 % (ref 11.5–15.5)
WBC: 5.8 10*3/uL (ref 4.0–10.5)
nRBC: 0 % (ref 0.0–0.2)

## 2020-06-17 LAB — URINE CULTURE

## 2020-06-17 LAB — HIV ANTIBODY (ROUTINE TESTING W REFLEX): HIV Screen 4th Generation wRfx: NONREACTIVE

## 2020-06-17 LAB — PROCALCITONIN: Procalcitonin: 0.25 ng/mL

## 2020-06-17 LAB — GLUCOSE, CAPILLARY
Glucose-Capillary: 170 mg/dL — ABNORMAL HIGH (ref 70–99)
Glucose-Capillary: 211 mg/dL — ABNORMAL HIGH (ref 70–99)
Glucose-Capillary: 254 mg/dL — ABNORMAL HIGH (ref 70–99)
Glucose-Capillary: 243 mg/dL — ABNORMAL HIGH (ref 70–99)

## 2020-06-17 LAB — C-REACTIVE PROTEIN: CRP: 10.5 mg/dL — ABNORMAL HIGH (ref <1.0)

## 2020-06-17 MED ORDER — ENSURE ENLIVE PO LIQD
237.0000 mL | Freq: Two times a day (BID) | ORAL | Status: DC
  Administered 2020-06-17 – 2020-06-18 (×3): 237 mL via ORAL

## 2020-06-17 MED ORDER — RIVAROXABAN 20 MG PO TABS
20.0000 mg | ORAL_TABLET | Freq: Every day | ORAL | Status: DC
Start: 2020-06-18 — End: 2020-06-19
  Administered 2020-06-18: 20 mg via ORAL
  Filled 2020-06-17: qty 1

## 2020-06-17 MED ORDER — NYSTATIN 100000 UNIT/GM EX POWD
Freq: Two times a day (BID) | CUTANEOUS | Status: DC
  Filled 2020-06-17: qty 15

## 2020-06-17 NOTE — Evaluation (Signed)
Occupational Therapy Evaluation Patient Details Name: Kaitlyn [name] MRN: [id number] DOB: 1950/10/15 Today's Date: 06/17/2020    History of Present Illness 70 y.o. female with medical history significant for chronic systolic heart failure with most recent echocardiogram December 2020 showing LVEF 45 to 50%, paroxysmal atrial fibrillation prescribed chronic anticoagulation on Xarelto, type 2 diabetes mellitus, who is admitted to [facility] on 06/16/2020 with severe COVID-19 infection after presenting to ED with c/o cough. HR in Afib RVR 170s, SaO2 on RA 88%O2, improved to 94-95%O2 on 3L O2 via [place].   Clinical Impression   This 69 y/o female presents with the above. PTA pt living at home alone, reports overall mod independent with ADL and functional mobility - pt reports she typically sits/sleeps in her recliner chair and uses a "wheelie chair" to navigate around her apartment (reports her wheelchair won't fit into the bathroom), reports has groceries delivered. Today pt demonstrating functional transfers (sit<>stand and side steps along EOB) using single UE support with minguard assist. Pt requiring up to mod-maxA for LB and toileting ADL at this time. Pt will benefit from continued acute OT and currently recommend follow up HHOT services to maximize her overall safety and independence with ADL and mobility.     Follow Up Recommendations  Home health OT;Supervision - Intermittent (pending progress)    Equipment Recommendations  None recommended by OT           Precautions / Restrictions Precautions Precautions: Fall Restrictions Weight Bearing Restrictions: No      Mobility Bed Mobility Overal bed mobility: Modified Independent             General bed mobility comments: HOB elevated    Transfers Overall transfer level: Needs assistance Equipment used:  (BSC in front of bed with pt preference to push up on that) Transfers: Sit to/from Stand Sit to Stand: Min guard          General transfer comment: minguard for balance and safety, no physical assist required    Balance Overall balance assessment: Needs assistance Sitting-balance support: Feet supported Sitting balance-Leahy Scale: Good     Standing balance support: Single extremity supported;No upper extremity supported Standing balance-Leahy Scale: Fair Standing balance comment: static standing with minguard for safety                           ADL either performed or assessed with clinical judgement   ADL Overall ADL's : Needs assistance/impaired Eating/Feeding: Modified independent;Sitting   Grooming: Set up;Sitting   Upper Body Bathing: Min guard;Sitting   Lower Body Bathing: Moderate assistance;Sitting/lateral leans;Sit to/from stand   Upper Body Dressing : Set up;Sitting   Lower Body Dressing: Moderate assistance;Sit to/from stand;Sitting/lateral leans   Toilet Transfer: Min guard;Stand-pivot   Toileting- Clothing Manipulation and Hygiene: Maximal assistance;Sit to/from stand       Functional mobility during ADLs: Min guard       Vision         Perception     Praxis      Pertinent Vitals/Pain Pain Assessment: No/denies pain     Hand Dominance Left   Extremity/Trunk Assessment Upper Extremity Assessment Upper Extremity Assessment: Overall WFL for tasks assessed   Lower Extremity Assessment Lower Extremity Assessment: Defer to PT evaluation       Communication Communication Communication: No difficulties   Cognition Arousal/Alertness: Awake/alert Behavior During Therapy: WFL for tasks assessed/performed Overall Cognitive Status: No family/caregiver present to determine baseline cognitive functioning  General Comments: cognition appears [facility] for basic tasks, not formally assessed. she is tangential and easily distracted, but is able to be redirected   General Comments  VSS on RA    Exercises      Shoulder Instructions      Home Living Family/patient expects to be discharged to:: Private residence Living Arrangements: Alone Available Help at Discharge: Friend(s);Available PRN/intermittently (friend lives across the hall and typically checks in daily) Type of Home: Apartment Home Access: Level entry     Home Layout: One level     Bathroom Shower/Tub: Tub/shower unit (pt sponge bathes)   Bathroom Toilet: Standard     Home Equipment: Environmental consultant - 2 wheels;Wheelchair - manual;Bedside commode   Additional Comments: w/c is too wide for her bathroom      Prior Functioning/Environment Level of Independence: Independent with assistive device(s)        Comments: wears diapers but typically able to make it to the bathroom, spends majority of her time in recliner, transfers to "wheelie chair" and uses that to navigate in apartment (states w/c is too large), has groceries delivered to her apartment, sponge bathes        OT Problem List: Decreased strength;Decreased activity tolerance;Impaired balance (sitting and/or standing);Obesity;Cardiopulmonary status limiting activity;Decreased knowledge of use of DME or AE      OT Treatment/Interventions: Self-care/ADL training;Therapeutic exercise;Energy conservation;DME and/or AE instruction;Therapeutic activities;Patient/family education;Balance training;Cognitive remediation/compensation    OT Goals(Current goals can be found in the care plan section) Acute Rehab OT Goals Patient Stated Goal: home when able OT Goal Formulation: With patient Time For Goal Achievement: 07/01/20 Potential to Achieve Goals: Good  OT Frequency: Min 2X/week   Barriers to D/C:            Co-evaluation              AM-PAC OT "6 Clicks" Daily Activity     Outcome Measure Help from another person eating meals?: None Help from another person taking care of personal grooming?: A Little Help from another person toileting, which includes using  toliet, bedpan, or urinal?: A Lot Help from another person bathing (including washing, rinsing, drying)?: A Lot Help from another person to put on and taking off regular upper body clothing?: A Little Help from another person to put on and taking off regular lower body clothing?: A Lot 6 Click Score: 16   End of Session Nurse Communication: Mobility status  Activity Tolerance: Patient tolerated treatment well Patient left: in bed;with call bell/phone within reach  OT Visit Diagnosis: Other abnormalities of gait and mobility (R26.89);Other (comment) (decreased activity tolerance)                Time: 4132-4401 OT Time Calculation (min): 23 min Charges:  OT General Charges $OT Visit: 1 Visit OT Evaluation $OT Eval Moderate Complexity: 1 Mod OT Treatments $Self Care/Home Management : 8-22 mins  Marcy Siren, OT Acute Rehabilitation Services Pager [phone] Office [phone]   Orlando Penner 06/17/2020, 2:53 PM

## 2020-06-17 NOTE — Plan of Care (Signed)

## 2020-06-17 NOTE — Evaluation (Signed)
Physical Therapy Evaluation Patient Details Name: Kaitlyn [name] MRN: [id number] DOB: 22-May-1950 Today's Date: 06/17/2020   History of Present Illness  70 y.o. female with medical history significant for chronic systolic heart failure with most recent echocardiogram December 2020 showing LVEF 45 to 50%, paroxysmal atrial fibrillation prescribed chronic anticoagulation on Xarelto, type 2 diabetes mellitus, who is admitted to [facility] on 06/16/2020 with severe COVID-19 infection after presenting to ED with c/o cough. HR in Afib RVR 170s, SaO2 on RA 88%O2, improved to 94-95%O2 on 3L O2 via [place].  Clinical Impression  PTA pt living alone in apartment with level entry. Pt reports she uses w/c and rolling office chair to navigate her home environment, has groceries delivered and is independent in ADLs and iADLs. Pt is peculiar but is aware of safety with her mobility. Pt states she sleeps in her recliner, and is able to demonstrate bed mobility with HoB elevated with min guard for safety Pt able to safely demonstrate her pivot transfers with min guard to and from [facility] beside bed. Pt is likely weaker than before hospitalization however is probably close to her baseline. PT recommending HHPT at discharge to improve strength. PT will continue to follow acutely.     Follow Up Recommendations Home health PT    Equipment Recommendations  None recommended by PT       Precautions / Restrictions Precautions Precautions: Fall Restrictions Weight Bearing Restrictions: No      Mobility  Bed Mobility Overal bed mobility: Modified Independent             General bed mobility comments: HOB elevated    Transfers Overall transfer level: Needs assistance Equipment used:  (BSC in front of bed with pt preference to push up on that) Transfers: Sit to/from Stand Sit to Stand: Min guard         General transfer comment: minguard for balance and safety, no physical assist  required  Ambulation/Gait             General Gait Details: pt declined reports she does not walk instead she stands and pivots to wheelchair, recliner, and rolling chair         Balance Overall balance assessment: Needs assistance Sitting-balance support: Feet supported Sitting balance-Leahy Scale: Good     Standing balance support: Single extremity supported;No upper extremity supported Standing balance-Leahy Scale: Fair Standing balance comment: static standing with minguard for safety                             Pertinent Vitals/Pain Pain Assessment: No/denies pain    Home Living Family/patient expects to be discharged to:: Private residence Living Arrangements: Alone Available Help at Discharge: Friend(s);Available PRN/intermittently (friend lives across the hall and typically checks in daily) Type of Home: Apartment Home Access: Level entry     Home Layout: One level Home Equipment: Walker - 2 wheels;Wheelchair - manual;Bedside commode Additional Comments: w/c is too wide for her bathroom    Prior Function Level of Independence: Independent with assistive device(s)         Comments: wears diapers but typically able to make it to the bathroom, spends majority of her time in recliner, transfers to "wheelie chair" and uses that to navigate in apartment (states w/c is too large), has groceries delivered to her apartment, sponge bathes     Hand Dominance   Dominant Hand: Left    Extremity/Trunk Assessment   Upper Extremity Assessment  Upper Extremity Assessment: Defer to OT evaluation    Lower Extremity Assessment Lower Extremity Assessment: LLE deficits/detail;Generalized weakness LLE Deficits / Details: prior L knee surgery reducing full RoM, however still WFL    Cervical / Trunk Assessment Cervical / Trunk Assessment: Kyphotic  Communication   Communication: No difficulties  Cognition Arousal/Alertness: Awake/alert Behavior During  Therapy: WFL for tasks assessed/performed Overall Cognitive Status: No family/caregiver present to determine baseline cognitive functioning                                 General Comments: cognition appears [facility] for basic tasks, not formally assessed. she is tangential and easily distracted, but is able to be redirected      General Comments General comments (skin integrity, edema, etc.): VSS on RA        Assessment/Plan    PT Assessment Patient needs continued PT services  PT Problem List Decreased strength;Decreased activity tolerance;Decreased range of motion;Decreased balance;Decreased mobility;Decreased cognition;Decreased safety awareness       PT Treatment Interventions DME instruction;Gait training;Functional mobility training;Therapeutic activities;Therapeutic exercise;Balance training;Cognitive remediation;Patient/family education;Wheelchair mobility training    PT Goals (Current goals can be found in the Care Plan section)  Acute Rehab PT Goals Patient Stated Goal: home when able PT Goal Formulation: With patient Time For Goal Achievement: 07/01/20 Potential to Achieve Goals: Fair    Frequency Min 3X/week    AM-PAC PT "6 Clicks" Mobility  Outcome Measure Help needed turning from your back to your side while in a flat bed without using bedrails?: None Help needed moving from lying on your back to sitting on the side of a flat bed without using bedrails?: None Help needed moving to and from a bed to a chair (including a wheelchair)?: A Little Help needed standing up from a chair using your arms (e.g., wheelchair or bedside chair)?: A Little Help needed to walk in hospital room?: A Lot Help needed climbing 3-5 steps with a railing? : A Lot 6 Click Score: 18    End of Session   Activity Tolerance: Patient tolerated treatment well Patient left: in bed;with call bell/phone within reach;with bed alarm set Nurse Communication: Mobility status PT Visit  Diagnosis: Unsteadiness on feet (R26.81);Muscle weakness (generalized) (M62.81);Difficulty in walking, not elsewhere classified (R26.2);Other abnormalities of gait and mobility (R26.89)    Time: 3500-9381 PT Time Calculation (min) (ACUTE ONLY): 28 min   Charges:   PT Evaluation $PT Eval Moderate Complexity: 1 Mod PT Treatments $Therapeutic Activity: 8-22 mins        Marveen Donlon B. Beverely Risen PT, DPT Acute Rehabilitation Services Pager [phone] Office [phone]   [facility] 06/17/2020, 3:55 PM

## 2020-06-17 NOTE — Progress Notes (Signed)
PROGRESS NOTE                                                                             PROGRESS NOTE                                                                                                                                                                                                             Patient Demographics:    Kaitlyn [name], is a 70 y.o. female, DOB - [birth year], JQG:[id number]  Outpatient Primary MD for the patient is Pcp, No    LOS - 1  Admit date - 06/16/2020    Chief Complaint  Patient presents with  . Cough  . Shortness of Breath       Brief Narrative    70 y.o. female with medical history significant for chronic systolic heart failure with most recent echocardiogram December 2020 showing LVEF 45 to 50%, paroxysmal atrial fibrillation prescribed chronic anticoagulation on Xarelto, type 2 diabetes mellitus,admitted to [facility] on 06/16/2020 with  COVID-19 infection  and A. fib with RVR.   Subjective:    Mylei Brackeen today reports one diarrhea yesterday, no recurrence after Imodium, reports dyspnea and cough with no significant change .   Assessment  & Plan :    Principal Problem:   COVID-19 virus infection Active Problems:   Diabetes mellitus, without long-term current use of insulin (HCC)   Atrial fibrillation with RVR (HCC)   Chronic systolic CHF (congestive heart failure) (HCC)   SOB (shortness of breath)  Acute Hypoxic Resp. Failure due to Acute Covid 19 Viral Pneumonitis during the ongoing 2020 Covid 19 Pandemic  -She is unvaccinated. -She is on 3.5 L nasal cannula. -Continue with IV Solu-Medrol -Continue with remdesivir -Continue to trend inflammatory markers dimers - Encouraged the patient to sit up in chair in the daytime use I-S and flutter valve for pulmonary toiletry and then prone in bed when at night.  Will advance activity and titrate down oxygen as possible. - Actemra/Baricitinib  off  label use -  patient was told that if COVID-19 pneumonitis gets worse we might potentially use Actemra off label, patient denies any known history of active diverticulitis, tuberculosis or hepatitis, understands the risks and benefits and wants to proceed with Actemra treatment if required.     SpO2: 94 % O2 Flow Rate (L/min): 3 L/min  Recent Labs  Lab 06/16/20 0418 06/16/20 0420 06/16/20 0512 06/16/20 1617 06/17/20 0807  WBC 9.2  --   --   --  5.8  PLT 109*  --   --   --  101*  CRP  --   --   --  9.3* 10.5*  BNP 144.1*  --   --   --   --   DDIMER  --   --   --  1.07* 1.08*  PROCALCITON  --   --   --  0.27 0.25  INR 1.1  --   --   --   --   LATICACIDVEN  --   --  1.3  --   --   SARSCOV2NAA  --  POSITIVE*  --   --   --        ABG     Component Value Date/Time   HCO3 22.2 10/09/2018 1921   TCO2 22 11/30/2015 2305   ACIDBASEDEF 2.3 (H) 10/09/2018 1921   O2SAT 72.5 10/09/2018 1921   A. fib with RVR: - Most recent echocardiogram performed in December 2020 LVEF 45 to 50%. - on Cardizem drip for heart rate control she is on 5 mg/h this morning, heart rate is controlled, will discontinue drip.  Continue with home medication metoprolol . -  CHA2DS2-VASc score of 6, continue Xarelto for anticoagulation .  Refused today's dose, I have discussed at length with her, she understands risks and benefits.  Reports she will take it tomorrow.  Type 2 diabetes mellitus:  - On Januvia as an outpatient.  Presenting blood sugar per presenting BMP noted to be 132. -Continue with insulin sliding scale and Tradjenta  Chronic systolic heart failure:  - With most recent echocardiogram in December 2020 showing LVEF of 45 to 50%, -Appears to be a euvolemic currently       Condition - Extremely Guarded  Family communication : patient is awake and coherent, discussed case with her at length, I have contacted available family member has relative who is her cousin, and updated as well.  Code  Status :  full  Consults  :  none  Procedures  :  none  Disposition Plan  :    Status is: Inpatient  Remains inpatient appropriate because:IV treatments appropriate due to intensity of illness or inability to take PO   Dispo: The patient is from: Home              Anticipated d/c is to: Home              Anticipated d/c date is: > 3 days              Patient currently is not medically stable to d/c.   Difficult to place patient No      DVT Prophylaxis  :  Xarelto  Lab Results  Component Value Date   PLT 101 (L) 06/17/2020    Diet :  Diet Order            Diet regular Room service appropriate? Yes; Fluid consistency: Thin  Diet effective now  Inpatient Medications  Scheduled Meds: . digoxin  0.125 mg Oral Daily  . feeding supplement  237 mL Oral BID BM  . insulin aspart  0-9 Units Subcutaneous TID WC  . linagliptin  5 mg Oral Daily  . methylPREDNISolone (SOLU-MEDROL) injection  40 mg Intravenous Q8H  . metoprolol succinate  50 mg Oral BID  . nystatin   Topical Once  . nystatin   Topical BID  . pantoprazole  40 mg Oral Daily  . [START ON 06/18/2020] rivaroxaban  20 mg Oral Q supper   Continuous Infusions: . diltiazem (CARDIZEM) infusion 5 mg/hr (06/17/20 0712)  . remdesivir 100 mg in NS 100 mL 100 mg (06/17/20 0855)   PRN Meds:.acetaminophen **OR** acetaminophen, guaiFENesin-codeine, loperamide  Antibiotics  :    Anti-infectives (From admission, onward)   Start     Dose/Rate Route Frequency Ordered Stop   06/17/20 1000  remdesivir 100 mg in sodium chloride 0.9 % 100 mL IVPB       "Followed by" Linked Group Details   100 mg 200 mL/hr over 30 Minutes Intravenous Daily 06/16/20 0652 06/21/20 0959   06/16/20 0700  remdesivir 200 mg in sodium chloride 0.9% 250 mL IVPB       "Followed by" Linked Group Details   200 mg 580 mL/hr over 30 Minutes Intravenous Once 06/16/20 4132 06/16/20 1144        Lian Pounds M.D on 06/17/2020 at 3:12  PM  To page go to [email or web address]   [facility] -  Office  [phone]      Objective:   Vitals:   06/17/20 0627 06/17/20 0728 06/17/20 0845 06/17/20 1203  BP:  138/85  121/78  Pulse:  60  73  Resp: 20 18  14   Temp:  98.7 F (37.1 C)  98.8 F (37.1 C)  TempSrc:  Axillary  Oral  SpO2:  97% 96% 94%  Weight: 111.7 kg     Height:        Wt Readings from Last 3 Encounters:  06/17/20 111.7 kg  04/30/20 90.7 kg  04/18/19 94.3 kg     Intake/Output Summary (Last 24 hours) at 06/17/2020 1512 Last data filed at 06/17/2020 08/15/2020 Gross per 24 hour  Intake 418.71 ml  Output -  Net 418.71 ml     Physical Exam  Awake Alert, Oriented X 3, No new F.N deficits, Normal affect Symmetrical Chest wall movement, Good air movement bilaterally, CTAB Irregular irregular,No Gallops,Rubs or new Murmurs, No Parasternal Heave +ve B.Sounds, Abd Soft, No tenderness, No rebound - guarding or rigidity. No Cyanosis, Clubbing or edema, No new Rash or bruise       Data Review:    CBC Recent Labs  Lab 06/16/20 0418 06/17/20 0807  WBC 9.2 5.8  HGB 14.1 13.6  HCT 41.6 40.8  PLT 109* 101*  MCV 102.5* 102.8*  MCH 34.7* 34.3*  MCHC 33.9 33.3  RDW 15.6* 15.3    Recent Labs  Lab 06/16/20 0418 06/16/20 0506 06/16/20 0512 06/16/20 1617 06/17/20 0807  NA 135  --   --   --   --   K 3.7  --   --   --   --   CL 104  --   --   --   --   CO2 19*  --   --   --   --   GLUCOSE 132*  --   --   --   --   BUN 14  --   --   --   --  CREATININE 1.12*  --   --   --   --   CALCIUM 8.4*  --   --   --   --   MG  --   --   --  2.0  --   CRP  --   --   --  9.3* 10.5*  DDIMER  --   --   --  1.07* 1.08*  PROCALCITON  --   --   --  0.27 0.25  LATICACIDVEN  --   --  1.3  --   --   INR 1.1  --   --   --   --   TSH  --  1.346  --   --   --   BNP 144.1*  --   --   --   --     ------------------------------------------------------------------------------------------------------------------ No  results for input(s): CHOL, HDL, LDLCALC, TRIG, CHOLHDL, LDLDIRECT in the last 72 hours.  Lab Results  Component Value Date   HGBA1C 5.7 (H) 04/17/2019   ------------------------------------------------------------------------------------------------------------------ Recent Labs    06/16/20 0506  TSH 1.346    Cardiac Enzymes No results for input(s): CKMB, TROPONINI, MYOGLOBIN in the last 168 hours.  Invalid input(s): CK ------------------------------------------------------------------------------------------------------------------    Component Value Date/Time   BNP 144.1 (H) 06/16/2020 0418    Micro Results Recent Results (from the past 240 hour(s))  SARS Coronavirus 2 by RT PCR (hospital order, performed in [facility] hospital lab) Nasopharyngeal Nasopharyngeal Swab     Status: Abnormal   Collection Time: 06/16/20  4:20 AM   Specimen: Nasopharyngeal Swab  Result Value Ref Range Status   SARS Coronavirus 2 POSITIVE (A) NEGATIVE Final    Comment: RESULT CALLED TO, READ BACK BY AND VERIFIED WITH: Caprice Beaver [id number] 06/16/2020 T. TYSOR (NOTE) SARS-CoV-2 target nucleic acids are DETECTED  SARS-CoV-2 RNA is generally detectable in upper respiratory specimens  during the acute phase of infection.  Positive results are indicative  of the presence of the identified virus, but do not rule out bacterial infection or co-infection with other pathogens not detected by the test.  Clinical correlation with patient history and  other diagnostic information is necessary to determine patient infection status.  The expected result is negative.  Fact Sheet for Patients:   BoilerBrush.com.cy   Fact Sheet for Healthcare Providers:   https://pope.com/    This test is not yet approved or cleared by the [place] FDA and  has been authorized for detection and/or diagnosis of SARS-CoV-2 by FDA under an Emergency Use Authorization (EUA).   This EUA will remain in effect (meaning th is test can be used) for the duration of  the COVID-19 declaration under Section 564(b)(1) of the Act, 21 U.S.C. section 360-bbb-3(b)(1), unless the authorization is terminated or revoked sooner.  Performed at [facility], [address] [place], [place] [address]   Blood culture (routine single)     Status: None (Preliminary result)   Collection Time: 06/16/20  4:48 AM   Specimen: BLOOD  Result Value Ref Range Status   Specimen Description BLOOD RIGHT WRIST  Final   Special Requests   Final    BOTTLES DRAWN AEROBIC AND ANAEROBIC Blood Culture adequate volume   Culture   Final    NO GROWTH < 12 HOURS Performed at [facility], [address], [place], [place] [address]    Report Status PENDING  Incomplete  Urine culture     Status: Abnormal   Collection  Time: 06/16/20  5:50 AM   Specimen: In/Out Cath Urine  Result Value Ref Range Status   Specimen Description IN/OUT CATH URINE  Final   Special Requests   Final    NONE Performed at [facility], [address], [place], [place] [address]    Culture MULTIPLE SPECIES PRESENT, SUGGEST RECOLLECTION (A)  Final   Report Status 06/17/2020 FINAL  Final    Radiology Reports DG Chest Portable 1 View  Result Date: 06/16/2020 CLINICAL DATA:  Shortness of breath EXAM: PORTABLE CHEST 1 VIEW COMPARISON:  Chest x-ray 04/17/2019 FINDINGS: The heart size and mediastinal contours are unchanged with persistent cardiomegaly. Aortic arch calcification. Bibasilar atelectasis. No focal consolidation. No pulmonary edema. No pleural effusion. No pneumothorax. No acute osseous abnormality. IMPRESSION: No active disease. Electronically Signed   By: Tish Frederickson M.D.   On: 06/16/2020 04:57

## 2020-06-18 DIAGNOSIS — J9601 Acute respiratory failure with hypoxia: Secondary | ICD-10-CM

## 2020-06-18 LAB — C-REACTIVE PROTEIN: CRP: 5.6 mg/dL — ABNORMAL HIGH (ref <1.0)

## 2020-06-18 LAB — COMPREHENSIVE METABOLIC PANEL
ALT: 11 U/L (ref 0–44)
AST: 18 U/L (ref 15–41)
Albumin: 3 g/dL — ABNORMAL LOW (ref 3.5–5.0)
Alkaline Phosphatase: 50 U/L (ref 38–126)
Anion gap: 9 (ref 5–15)
BUN: 33 mg/dL — ABNORMAL HIGH (ref 8–23)
CO2: 18 mmol/L — ABNORMAL LOW (ref 22–32)
Calcium: 8.2 mg/dL — ABNORMAL LOW (ref 8.9–10.3)
Chloride: 105 mmol/L (ref 98–111)
Creatinine, Ser: 1.4 mg/dL — ABNORMAL HIGH (ref 0.44–1.00)
GFR, Estimated: 40 mL/min — ABNORMAL LOW (ref >60)
Glucose, Bld: 290 mg/dL — ABNORMAL HIGH (ref 70–99)
Potassium: 4.1 mmol/L (ref 3.5–5.1)
Sodium: 132 mmol/L — ABNORMAL LOW (ref 135–145)
Total Bilirubin: 0.9 mg/dL (ref 0.3–1.2)
Total Protein: 6.4 g/dL — ABNORMAL LOW (ref 6.5–8.1)

## 2020-06-18 LAB — PROCALCITONIN: Procalcitonin: 0.18 ng/mL

## 2020-06-18 LAB — GLUCOSE, CAPILLARY
Glucose-Capillary: 262 mg/dL — ABNORMAL HIGH (ref 70–99)
Glucose-Capillary: 203 mg/dL — ABNORMAL HIGH (ref 70–99)
Glucose-Capillary: 217 mg/dL — ABNORMAL HIGH (ref 70–99)
Glucose-Capillary: 215 mg/dL — ABNORMAL HIGH (ref 70–99)

## 2020-06-18 LAB — CBC
HCT: 40.2 % (ref 36.0–46.0)
Hemoglobin: 12.9 g/dL (ref 12.0–15.0)
MCH: 32.7 pg (ref 26.0–34.0)
MCHC: 32.1 g/dL (ref 30.0–36.0)
MCV: 101.8 fL — ABNORMAL HIGH (ref 80.0–100.0)
Platelets: 114 10*3/uL — ABNORMAL LOW (ref 150–400)
RBC: 3.95 MIL/uL (ref 3.87–5.11)
RDW: 15.1 % (ref 11.5–15.5)
WBC: 8.8 10*3/uL (ref 4.0–10.5)
nRBC: 0 % (ref 0.0–0.2)

## 2020-06-18 LAB — D-DIMER, QUANTITATIVE: D-Dimer, Quant: 0.68 ug/mL-FEU — ABNORMAL HIGH (ref 0.00–0.50)

## 2020-06-18 MED ORDER — ENSURE ENLIVE PO LIQD
237.0000 mL | Freq: Three times a day (TID) | ORAL | Status: DC
  Administered 2020-06-18 – 2020-06-19 (×3): 237 mL via ORAL

## 2020-06-18 MED ORDER — ADULT MULTIVITAMIN W/MINERALS CH
1.0000 | ORAL_TABLET | Freq: Every day | ORAL | Status: DC
  Administered 2020-06-18 – 2020-06-19 (×2): 1 via ORAL
  Filled 2020-06-18 (×2): qty 1

## 2020-06-18 MED ORDER — PROSOURCE PLUS PO LIQD
30.0000 mL | Freq: Two times a day (BID) | ORAL | Status: DC
  Administered 2020-06-18 – 2020-06-19 (×2): 30 mL via ORAL
  Filled 2020-06-18 (×2): qty 30

## 2020-06-18 NOTE — TOC Initial Note (Addendum)
Transition of Care [facility]) - Initial/Assessment Note    Patient Details  Name: Kaitlyn [name] MRN: [id number] Date of Birth: Mar 05, 1951  [facility] CM/SW Contact:    Lawerance Sabal, RN Phone Number: 06/18/2020, 2:14 PM  Clinical Narrative:                 Sherron Monday w patient over the phone to discuss support at discharge. She states that she lives by herself in a first floor apartment. She is WC bound, has a WC and a rollator that uses to scoot around her apartment. She discussed the services she uses in great detail such as, [employer] home delivery of groceries, [facility] home delivery of medication and sundries every M,W,F.  She describes in detail an extensive support system from neighbors across the hall who check on her multiple times daily, and get her mail and take out her trash. We discussed HH services and the patient repeatedly declined them.  She endorses needing transportation at [place], states she usually uses [facility].     Requested MD to send any scripts through [facility] at [place].    Expected Discharge Plan: Home/Self Care Barriers to Discharge: Continued Medical Work up   Patient Goals and CMS Choice Patient states their goals for this hospitalization and ongoing recovery are:: to go home CMS Medicare.gov Compare Post Acute Care list provided to:: Patient    Expected Discharge Plan and Services Expected Discharge Plan: Home/Self Care   Discharge Planning Services: CM Consult   Living arrangements for the past 2 months: Apartment                                      Prior Living Arrangements/Services Living arrangements for the past 2 months: Apartment Lives with:: Self              Current home services: DME    Activities of Daily Living Home Assistive Devices/Equipment: Environmental consultant (specify type) ADL Screening (condition at time of admission) Patient's cognitive ability adequate to safely complete daily activities?: Yes Is the patient deaf or  have difficulty hearing?: No Does the patient have difficulty seeing, even when wearing glasses/contacts?: No Does the patient have difficulty concentrating, remembering, or making decisions?: No Patient able to express need for assistance with ADLs?: Yes Does the patient have difficulty dressing or bathing?: Yes Independently performs ADLs?: No Communication: Independent Dressing (OT): Needs assistance Is this a change from baseline?: Change from baseline, expected to last >3 days Grooming: Dependent Is this a change from baseline?: Change from baseline, expected to last >3 days Feeding: Independent Bathing: Needs assistance Is this a change from baseline?: Change from baseline, expected to last >3 days Toileting: Needs assistance Is this a change from baseline?: Change from baseline, expected to last >3days In/Out Bed: Needs assistance Is this a change from baseline?: Change from baseline, expected to last >3 days Walks in Home: Independent with device (comment) (walker) Does the patient have difficulty walking or climbing stairs?: Yes Weakness of Legs: Both Weakness of Arms/Hands: None  Permission Sought/Granted                  Emotional Assessment              Admission diagnosis:  Atrial fibrillation with RVR (HCC) [I48.91] H/O medication noncompliance [Z91.14] Acute hypoxemic respiratory failure due to COVID-19 (HCC) [U07.1, J96.01] COVID-19 virus infection [U07.1] Patient Active  Problem List   Diagnosis Date Noted  . COVID-19 virus infection 06/16/2020  . SOB (shortness of breath) 06/16/2020  . Medication nonadherence due to psychosocial problem 04/18/2019  . Persistent atrial fibrillation (HCC) 04/17/2019  . Positive occult stool blood test 01/23/2019  . Advance care planning 01/04/2019  . Chronic systolic CHF (congestive heart failure) (HCC)   . Vitamin B 12 deficiency   . Neurocognitive deficits 11/01/2018  . Chronic diarrhea 10/16/2018  .  Thrombocytopenia (HCC) 10/16/2018  . CAD (coronary artery disease) 10/16/2018  . Acute respiratory failure with hypoxia (HCC) 10/16/2018  . Generalized weakness 10/16/2018  . Anemia 11/23/2017  . NSTEMI (non-ST elevated myocardial infarction) (HCC) 01/09/2017  . Ischemic cardiomyopathy 01/09/2017  . Obesity, Class III, BMI 40-49.9 (morbid obesity) (HCC) 01/09/2017  . Chest pain   . Atrial fibrillation with RVR (HCC) 01/07/2017  . Diarrhea   . Anaphylaxis 12/04/2015  . Hypotension due to hypovolemia 12/01/2015  . Leukocytosis   . Type 2 diabetes mellitus with hyperglycemia, with long-term current use of insulin (HCC)   . Lactic acidosis 07/07/2015  . Dehydration 07/07/2015  . Angioedema 07/07/2015  . Allergic reaction 07/07/2015  . Acute on chronic combined systolic and diastolic heart failure (HCC) 03/24/2015  . Atrial fibrillation with rapid ventricular response (HCC)   . Diabetes mellitus, without long-term current use of insulin (HCC)   . Thyroid nodule   . Seizures (HCC)   . Hypokalemia   . Hypomagnesemia   . Lymphadenopathy 09/24/2014  . Macrocytosis   . Essential hypertension   . Psychosocial impairment   . Acute on chronic systolic CHF (congestive heart failure) (HCC) 04/27/2014  . H/O noncompliance with medical treatment, presenting hazards to health 03/23/2014  . Chronic atrial fibrillation 03/23/2014   PCP:  Pcp, No Pharmacy:   [facility] - [place], [place] - [address] [place] [address] Phone: [phone] Fax: [phone]  [facility] - [place], [place] - [address] [place] [address] Phone: [phone] Fax: [phone]     Social Determinants of Health (SDOH) Interventions    Readmission Risk Interventions Readmission Risk Prevention Plan 10/17/2018  Transportation Screening Complete  PCP or Specialist Appt within 3-5 Days Not Complete  Not Complete  comments plan to d/c to SNF  HRI or Home Care Consult Complete  Social Work Consult for Recovery Care Planning/Counseling Complete  Palliative Care Screening Not Applicable  Medication Review Oceanographer) Complete  Some recent data might be hidden

## 2020-06-18 NOTE — Progress Notes (Signed)
Initial Nutrition Assessment  DOCUMENTATION CODES:   Obesity unspecified  INTERVENTION:   Increase to Ensure Enlive po TID, each supplement provides 350 kcal and 20 grams of protein  72ml Prosource Plus po BID, each supplement provides 100 kcals and 15 grams of protein  MVI with minerals daily   NUTRITION DIAGNOSIS:   Increased nutrient needs related to acute illness (COVID-19) as evidenced by estimated needs.    GOAL:   Patient will meet greater than or equal to 90% of their needs    MONITOR:   PO intake,Supplement acceptance,Labs,Weight trends,I & O's  REASON FOR ASSESSMENT:   Malnutrition Screening Tool    ASSESSMENT:   Pt admitted with COVID-19 infection. PMH includes CHF, afib, type 2 DM.   Pt denies N/V/D and abdominal pain. Pt noted to have eaten 40% of breakfast this morning. No other meal documentation available. Pt with orders for Ensure BID which she is consuming well per RN. Will increase frequency given inadequate meal intake and increased needs.  Reviewed wt history. No significant wt changes noted.   UOP: documented today  Labs: Na 132 (L), CBGs 203-262 Medications: ss novolog TID with meals, 5mg  tradjenta daily, solu-medrol   Diet Order:   Diet Order            Diet regular Room service appropriate? Yes; Fluid consistency: Thin  Diet effective now                 EDUCATION NEEDS:   No education needs have been identified at this time  Skin:  Skin Assessment: Reviewed RN Assessment  Last BM:  PTA  Height:   Ht Readings from Last 1 Encounters:  06/16/20 5\' 11"  (1.803 m)    Weight:   Wt Readings from Last 1 Encounters:  06/18/20 111.4 kg    BMI:  Body mass index is 34.25 kg/m.  Estimated Nutritional Needs:   Kcal:  2100-2300  Protein:  130-140 grams  Fluid:  >2.1L    , MS, RD, LDN RD pager number and weekend/on-call pager number located in Amion.

## 2020-06-18 NOTE — Progress Notes (Signed)
Inpatient Diabetes Program Recommendations  AACE/ADA: New Consensus Statement on Inpatient Glycemic Control (2015)  Target Ranges:  Prepandial:   less than 140 mg/dL      Peak postprandial:   less than 180 mg/dL (1-2 hours)      Critically ill patients:  140 - 180 mg/dL   Lab Results  Component Value Date   GLUCAP 203 (H) 06/18/2020   HGBA1C 5.7 (H) 04/17/2019    Review of Glycemic Control Results for Kaitlyn [name], Kaitlyn [name] (MRN [id number]) as of 06/18/2020 12:13  Ref. Range 06/17/2020 12:05 06/17/2020 17:00 06/17/2020 20:07 06/18/2020 07:29 06/18/2020 12:06  Glucose-Capillary Latest Ref Range: 70 - 99 mg/dL 314 (H) 970 (H) 263 (H) 262 (H) 203 (H)   Diabetes history:  DM2 Outpatient Diabetes medications:  Januvia 10 daily Current orders for Inpatient glycemic control:  Novolog 0-9 units tID  Tradjenta 5 mg daily Solumedrol 40 mg Q8H  Inpatient Diabetes Program Recommendations:    If appropriate, might consider:  Novolog 0-15 units TID and 0-5 units QHS  Will continue to follow while inpatient.  Thank you, Dulce Sellar, RN, BSN Diabetes Coordinator Inpatient Diabetes Program [phone] (team pager from 8a-5p)

## 2020-06-18 NOTE — Progress Notes (Signed)
PROGRESS NOTE                                                                             PROGRESS NOTE                                                                                                                                                                                                             Patient Demographics:    Kaitlyn [name], is a 70 y.o. female, DOB - 08-12-1950, ZLD:[id number]  Outpatient Primary MD for the patient is Pcp, No    LOS - 2  Admit date - 06/16/2020    Chief Complaint  Patient presents with  . Cough  . Shortness of Breath       Brief Narrative   70 y.o. female with medical history significant for chronic systolic heart failure, PAF, DM-2-presented to the ED with cough, shortness of breath-she was found to have acute hypoxic respiratory failure due to COVID-19 pneumonia and A. fib with RVR.  Significant studies: 1/31 >>chest x-ray: No obvious pneumonia.  Microbiology:  1/31>> blood culture: No growth 1/31>> urine culture: Multiple species  COVID-19 medications: Remdesivir: 1/31>> Steroids: 1/31>>  DVT prophylaxis: Xarelto   Subjective:   Feels better-titrated off oxygen this morning.   Assessment  & Plan :    Acute Hypoxic Resp. Failure due to suspected acute Covid 19 pneumonia: Although chest x-ray without pneumonia-given hypoxemia high suspicion that she had infiltrates not seen on chest x-ray.  Continue vent-improving-titrated to room air this morning-continue steroid/Remdesivir.  CRP downtrending-repeat imaging in a.m.   COVID-19 Labs  Recent Labs    06/16/20 1617 06/17/20 0807 06/18/20 0121  DDIMER 1.07* 1.08* 0.68*  FERRITIN 69  --   --   LDH 180  --   --   CRP 9.3* 10.5* 5.6*    Lab Results  Component Value Date   SARSCOV2NAA POSITIVE (A) 06/16/2020   SARSCOV2NAA NEGATIVE 04/17/2019   SARSCOV2NAA *Not Detected 10/30/2018   SARSCOV2NAA NEGATIVE 10/16/2018       Component  Value Date/Time   HCO3 22.2 10/09/2018 1921   TCO2 22 11/30/2015 2305   ACIDBASEDEF 2.3 (H) 10/09/2018 1921   O2SAT 72.5 10/09/2018 1921   A. fib with RVR: Rate controlled-no longer on Cardizem drip-continue digoxin and metoprolol.  Remains on Xarelto.  Chronic systolic heart failure (EF 45-50% on 04/17/2019): Volume status stable-follow closely.  DM-2: CBGs relatively stable-continue SSI and Tradjenta.  Follow and adjust.  Recent Labs    06/17/20 2007 06/18/20 0729 06/18/20 1206  GLUCAP 243* 262* 203*    Condition -  Guarded  Family communication : Cousin-Anthony/his spouse-[phone]-on 2/2. Her cousin has been estranged with her for the past several years-patient has refused help from family-has refused home health services in the past. Appreciate case management/social work evaluation today.  Code Status :  Full  Consults  :  none  Procedures  :  none  Disposition Plan  :  Home  Status is: Inpatient  Remains inpatient appropriate because:IV treatments appropriate due to intensity of illness or inability to take PO   Dispo: The patient is from: Home              Anticipated d/c is to: Home              Anticipated d/c date is: > 1 days              Patient currently is not medically stable to d/c.   Difficult to place patient No   Lab Results  Component Value Date   PLT 114 (L) 06/18/2020    Diet :  Diet Order            Diet regular Room service appropriate? Yes; Fluid consistency: Thin  Diet effective now                  Inpatient Medications  Scheduled Meds: . (feeding supplement) PROSource Plus  30 mL Oral BID BM  . digoxin  0.125 mg Oral Daily  . feeding supplement  237 mL Oral TID BM  . insulin aspart  0-9 Units Subcutaneous TID WC  . linagliptin  5 mg Oral Daily  . methylPREDNISolone (SOLU-MEDROL) injection  40 mg Intravenous Q8H  . metoprolol succinate  50 mg Oral BID  . multivitamin with minerals  1 tablet Oral Daily  . nystatin    Topical BID  . pantoprazole  40 mg Oral Daily  . rivaroxaban  20 mg Oral Q supper   Continuous Infusions: . diltiazem (CARDIZEM) infusion Stopped (06/17/20 1524)  . remdesivir 100 mg in NS 100 mL 100 mg (06/18/20 0831)   PRN Meds:.acetaminophen **OR** acetaminophen, guaiFENesin-codeine, loperamide  Antibiotics  :    Anti-infectives (From admission, onward)   Start     Dose/Rate Route Frequency Ordered Stop   06/17/20 1000  remdesivir 100 mg in sodium chloride 0.9 % 100 mL IVPB       "Followed by" Linked Group Details   100 mg 200 mL/hr over 30 Minutes Intravenous Daily 06/16/20 0652 06/21/20 0959   06/16/20 0700  remdesivir 200 mg in sodium chloride 0.9% 250 mL IVPB       "Followed by" Linked Group Details   200 mg 580 mL/hr over 30 Minutes Intravenous Once 06/16/20 6948 06/16/20 1144        Peniel Hass M.D on 06/18/2020 at 3:27 PM  To page go to [email or web address]   [facility] -  Office  [phone]      Objective:   Vitals:  06/18/20 0007 06/18/20 0443 06/18/20 0700 06/18/20 1100  BP: 120/77 116/73 126/76 120/75  Pulse: 69 71 61   Resp: 17 18 20 20   Temp: 97.7 F (36.5 C) 97.9 F (36.6 C) 98 F (36.7 C) 97.9 F (36.6 C)  TempSrc: Oral Oral Oral Oral  SpO2: 93% 94% 92%   Weight:  111.4 kg    Height:        Wt Readings from Last 3 Encounters:  06/18/20 111.4 kg  04/30/20 90.7 kg  04/18/19 94.3 kg     Intake/Output Summary (Last 24 hours) at 06/18/2020 1527 Last data filed at 06/18/2020 1429 Gross per 24 hour  Intake 496.41 ml  Output 800 ml  Net -303.59 ml     Physical Exam Gen Exam:Alert awake-not in any distress HEENT:atraumatic, normocephalic Chest: B/L clear to auscultation anteriorly CVS:S1S2 regular Abdomen:soft non tender, non distended Extremities:no edema Neurology: Non focal Skin: no rash      Data Review:    CBC Recent Labs  Lab 06/16/20 0418 06/17/20 0807 06/18/20 0121  WBC 9.2 5.8 8.8  HGB 14.1 13.6 12.9  HCT  41.6 40.8 40.2  PLT 109* 101* 114*  MCV 102.5* 102.8* 101.8*  MCH 34.7* 34.3* 32.7  MCHC 33.9 33.3 32.1  RDW 15.6* 15.3 15.1    Recent Labs  Lab 06/16/20 0418 06/16/20 0506 06/16/20 0512 06/16/20 1617 06/17/20 0807 06/18/20 0121  NA 135  --   --   --   --  132*  K 3.7  --   --   --   --  4.1  CL 104  --   --   --   --  105  CO2 19*  --   --   --   --  18*  GLUCOSE 132*  --   --   --   --  290*  BUN 14  --   --   --   --  33*  CREATININE 1.12*  --   --   --   --  1.40*  CALCIUM 8.4*  --   --   --   --  8.2*  AST  --   --   --   --   --  18  ALT  --   --   --   --   --  11  ALKPHOS  --   --   --   --   --  50  BILITOT  --   --   --   --   --  0.9  ALBUMIN  --   --   --   --   --  3.0*  MG  --   --   --  2.0  --   --   CRP  --   --   --  9.3* 10.5* 5.6*  DDIMER  --   --   --  1.07* 1.08* 0.68*  PROCALCITON  --   --   --  0.27 0.25 0.18  LATICACIDVEN  --   --  1.3  --   --   --   INR 1.1  --   --   --   --   --   TSH  --  1.346  --   --   --   --   BNP 144.1*  --   --   --   --   --     ------------------------------------------------------------------------------------------------------------------ No results for input(s): CHOL, HDL,  LDLCALC, TRIG, CHOLHDL, LDLDIRECT in the last 72 hours.  Lab Results  Component Value Date   HGBA1C 5.7 (H) 04/17/2019   ------------------------------------------------------------------------------------------------------------------ Recent Labs    06/16/20 0506  TSH 1.346    Cardiac Enzymes No results for input(s): CKMB, TROPONINI, MYOGLOBIN in the last 168 hours.  Invalid input(s): CK ------------------------------------------------------------------------------------------------------------------    Component Value Date/Time   BNP 144.1 (H) 06/16/2020 0418    Micro Results Recent Results (from the past 240 hour(s))  SARS Coronavirus 2 by RT PCR (hospital order, performed in [facility] hospital lab) Nasopharyngeal  Nasopharyngeal Swab     Status: Abnormal   Collection Time: 06/16/20  4:20 AM   Specimen: Nasopharyngeal Swab  Result Value Ref Range Status   SARS Coronavirus 2 POSITIVE (A) NEGATIVE Final    Comment: RESULT CALLED TO, READ BACK BY AND VERIFIED WITH: Caprice Beaver 1751 06/16/2020 T. TYSOR (NOTE) SARS-CoV-2 target nucleic acids are DETECTED  SARS-CoV-2 RNA is generally detectable in upper respiratory specimens  during the acute phase of infection.  Positive results are indicative  of the presence of the identified virus, but do not rule out bacterial infection or co-infection with other pathogens not detected by the test.  Clinical correlation with patient history and  other diagnostic information is necessary to determine patient infection status.  The expected result is negative.  Fact Sheet for Patients:   BoilerBrush.com.cy   Fact Sheet for Healthcare Providers:   https://pope.com/    This test is not yet approved or cleared by the [place] FDA and  has been authorized for detection and/or diagnosis of SARS-CoV-2 by FDA under an Emergency Use Authorization (EUA).  This EUA will remain in effect (meaning th is test can be used) for the duration of  the COVID-19 declaration under Section 564(b)(1) of the Act, 21 U.S.C. section 360-bbb-3(b)(1), unless the authorization is terminated or revoked sooner.  Performed at [facility], [address], [place], [place] [address]   Blood culture (routine single)     Status: None (Preliminary result)   Collection Time: 06/16/20  4:48 AM   Specimen: BLOOD  Result Value Ref Range Status   Specimen Description BLOOD RIGHT WRIST  Final   Special Requests   Final    BOTTLES DRAWN AEROBIC AND ANAEROBIC Blood Culture adequate volume   Culture   Final    NO GROWTH 2 DAYS Performed at [facility], [address], [place], [place] [address]    Report Status PENDING   Incomplete  Urine culture     Status: Abnormal   Collection Time: 06/16/20  5:50 AM   Specimen: In/Out Cath Urine  Result Value Ref Range Status   Specimen Description IN/OUT CATH URINE  Final   Special Requests   Final    NONE Performed at [facility], [address] [place], [place] [address]    Culture MULTIPLE SPECIES PRESENT, SUGGEST RECOLLECTION (A)  Final   Report Status 06/17/2020 FINAL  Final    Radiology Reports DG Chest Portable 1 View  Result Date: 06/16/2020 CLINICAL DATA:  Shortness of breath EXAM: PORTABLE CHEST 1 VIEW COMPARISON:  Chest x-ray 04/17/2019 FINDINGS: The heart size and mediastinal contours are unchanged with persistent cardiomegaly. Aortic arch calcification. Bibasilar atelectasis. No focal consolidation. No pulmonary edema. No pleural effusion. No pneumothorax. No acute osseous abnormality. IMPRESSION: No active disease. Electronically Signed   By: Tish Frederickson M.D.   On: 06/16/2020 04:57

## 2020-06-18 NOTE — Discharge Instructions (Addendum)
Person Under Monitoring Name: Addisynn Vassell  Location: [address] [place] [address]   Infection Prevention Recommendations for Individuals Confirmed to have, or Being Evaluated for, 2019 Novel Coronavirus (COVID-19) Infection Who Receive Care at Home  Individuals who are confirmed to have, or are being evaluated for, COVID-19 should follow the prevention steps below until a healthcare provider or local or state health department says they can return to normal activities.  Stay home except to get medical care You should restrict activities outside your home, except for getting medical care. Do not go to work, school, or public areas, and do not use public transportation or taxis.  Call ahead before visiting your doctor Before your medical appointment, call the healthcare provider and tell them that you have, or are being evaluated for, COVID-19 infection. This will help the healthcare providers office take steps to keep other people from getting infected. Ask your healthcare provider to call the local or state health department.  Monitor your symptoms Seek prompt medical attention if your illness is worsening (e.g., difficulty breathing). Before going to your medical appointment, call the healthcare provider and tell them that you have, or are being evaluated for, COVID-19 infection. Ask your healthcare provider to call the local or state health department.  Wear a facemask You should wear a facemask that covers your nose and mouth when you are in the same room with other people and when you visit a healthcare provider. People who live with or visit you should also wear a facemask while they are in the same room with you.  Separate yourself from other people in your home As much as possible, you should stay in a different room from other people in your home. Also, you should use a separate bathroom, if available.  Avoid sharing household items You should not  share dishes, drinking glasses, cups, eating utensils, towels, bedding, or other items with other people in your home. After using these items, you should wash them thoroughly with soap and water.  Cover your coughs and sneezes Cover your mouth and nose with a tissue when you cough or sneeze, or you can cough or sneeze into your sleeve. Throw used tissues in a lined trash can, and immediately wash your hands with soap and water for at least 20 seconds or use an alcohol-based hand rub.  Wash your [employer] your hands often and thoroughly with soap and water for at least 20 seconds. You can use an alcohol-based hand sanitizer if soap and water are not available and if your hands are not visibly dirty. Avoid touching your eyes, nose, and mouth with unwashed hands.   Prevention Steps for Caregivers and Household Members of Individuals Confirmed to have, or Being Evaluated for, COVID-19 Infection Being Cared for in the Home  If you live with, or provide care at home for, a person confirmed to have, or being evaluated for, COVID-19 infection please follow these guidelines to prevent infection:  Follow healthcare providers instructions Make sure that you understand and can help the patient follow any healthcare provider instructions for all care.  Provide for the patients basic needs You should help the patient with basic needs in the home and provide support for getting groceries, prescriptions, and other personal needs.  Monitor the patients symptoms If they are getting sicker, call his or her medical provider and tell them that the patient has, or is being evaluated for, COVID-19 infection. This will help the healthcare  providers office take steps to keep other people from getting infected. Ask the healthcare provider to call the local or state health department.  Limit the number of people who have contact with the patient  If possible, have only one caregiver for the  patient.  Other household members should stay in another home or place of residence. If this is not possible, they should stay  in another room, or be separated from the patient as much as possible. Use a separate bathroom, if available.  Restrict visitors who do not have an essential need to be in the home.  Keep older adults, very young children, and other sick people away from the patient Keep older adults, very young children, and those who have compromised immune systems or chronic health conditions away from the patient. This includes people with chronic heart, lung, or kidney conditions, diabetes, and cancer.  Ensure good ventilation Make sure that shared spaces in the home have good air flow, such as from an air conditioner or an opened window, weather permitting.  Wash your hands often  Wash your hands often and thoroughly with soap and water for at least 20 seconds. You can use an alcohol based hand sanitizer if soap and water are not available and if your hands are not visibly dirty.  Avoid touching your eyes, nose, and mouth with unwashed hands.  Use disposable paper towels to dry your hands. If not available, use dedicated cloth towels and replace them when they become wet.  Wear a facemask and gloves  Wear a disposable facemask at all times in the room and gloves when you touch or have contact with the patients blood, body fluids, and/or secretions or excretions, such as sweat, saliva, sputum, nasal mucus, vomit, urine, or feces.  Ensure the mask fits over your nose and mouth tightly, and do not touch it during use.  Throw out disposable facemasks and gloves after using them. Do not reuse.  Wash your hands immediately after removing your facemask and gloves.  If your personal clothing becomes contaminated, carefully remove clothing and launder. Wash your hands after handling contaminated clothing.  Place all used disposable facemasks, gloves, and other waste in a lined  container before disposing them with other household waste.  Remove gloves and wash your hands immediately after handling these items.  Do not share dishes, glasses, or other household items with the patient  Avoid sharing household items. You should not share dishes, drinking glasses, cups, eating utensils, towels, bedding, or other items with a patient who is confirmed to have, or being evaluated for, COVID-19 infection.  After the person uses these items, you should wash them thoroughly with soap and water.  Wash laundry thoroughly  Immediately remove and wash clothes or bedding that have blood, body fluids, and/or secretions or excretions, such as sweat, saliva, sputum, nasal mucus, vomit, urine, or feces, on them.  Wear gloves when handling laundry from the patient.  Read and follow directions on labels of laundry or clothing items and detergent. In general, wash and dry with the warmest temperatures recommended on the label.  Clean all areas the individual has used often  Clean all touchable surfaces, such as counters, tabletops, doorknobs, bathroom fixtures, toilets, phones, keyboards, tablets, and bedside tables, every day. Also, clean any surfaces that may have blood, body fluids, and/or secretions or excretions on them.  Wear gloves when cleaning surfaces the patient has come in contact with.  Use a diluted bleach solution (e.g., dilute bleach with  1 part bleach and 10 parts water) or a household disinfectant with a label that says EPA-registered for coronaviruses. To make a bleach solution at home, add 1 tablespoon of bleach to 1 quart (4 cups) of water. For a larger supply, add  cup of bleach to 1 gallon (16 cups) of water.  Read labels of cleaning products and follow recommendations provided on product labels. Labels contain instructions for safe and effective use of the cleaning product including precautions you should take when applying the product, such as wearing gloves or  eye protection and making sure you have good ventilation during use of the product.  Remove gloves and wash hands immediately after cleaning.  Monitor yourself for signs and symptoms of illness Caregivers and household members are considered close contacts, should monitor their health, and will be asked to limit movement outside of the home to the extent possible. Follow the monitoring steps for close contacts listed on the symptom monitoring form.   ? If you have additional questions, contact your local health department or call the epidemiologist on call at [phone] (available 24/7). ? This guidance is subject to change. For the most up-to-date guidance from [facility], please refer to their website: LargeNames.tn on my medicine - XARELTO (Rivaroxaban)  Why was Xarelto prescribed for you? Xarelto was prescribed for you to reduce the risk of a blood clot forming that can cause a stroke if you have a medical condition called atrial fibrillation (a type of irregular heartbeat).  What do you need to know about xarelto ? Take your Xarelto ONCE DAILY at the same time every day with your evening meal. If you have difficulty swallowing the tablet whole, you may crush it and mix in applesauce just prior to taking your dose.  Take Xarelto exactly as prescribed by your doctor and DO NOT stop taking Xarelto without talking to the doctor who prescribed the medication.  Stopping without other stroke prevention medication to take the place of Xarelto may increase your risk of developing a clot that causes a stroke.  Refill your prescription before you run out.  After discharge, you should have regular check-up appointments with your healthcare provider that is prescribing your Xarelto.  In the future your dose may need to be changed if your kidney function or weight changes by a significant amount.  What do you do if you miss  a dose? If you are taking Xarelto ONCE DAILY and you miss a dose, take it as soon as you remember on the same day then continue your regularly scheduled once daily regimen the next day. Do not take two doses of Xarelto at the same time or on the same day.   Important Safety Information A possible side effect of Xarelto is bleeding. You should call your healthcare provider right away if you experience any of the following: ? Bleeding from an injury or your nose that does not stop. ? Unusual colored urine (red or dark brown) or unusual colored stools (red or black). ? Unusual bruising for unknown reasons. ? A serious fall or if you hit your head (even if there is no bleeding).  Some medicines may interact with Xarelto and might increase your risk of bleeding while on Xarelto. To help avoid this, consult your healthcare provider or pharmacist prior to using any new prescription or non-prescription medications, including herbals, vitamins, non-steroidal anti-inflammatory drugs (NSAIDs) and supplements.  This website has more information on Xarelto: VisitDestination.com.br.

## 2020-06-19 ENCOUNTER — Inpatient Hospital Stay (HOSPITAL_COMMUNITY): Payer: Medicare (Managed Care)

## 2020-06-19 ENCOUNTER — Other Ambulatory Visit (HOSPITAL_COMMUNITY): Payer: Self-pay | Admitting: Internal Medicine

## 2020-06-19 LAB — COMPREHENSIVE METABOLIC PANEL
ALT: 10 U/L (ref 0–44)
AST: 15 U/L (ref 15–41)
Albumin: 3.1 g/dL — ABNORMAL LOW (ref 3.5–5.0)
Alkaline Phosphatase: 48 U/L (ref 38–126)
Anion gap: 9 (ref 5–15)
BUN: 34 mg/dL — ABNORMAL HIGH (ref 8–23)
CO2: 19 mmol/L — ABNORMAL LOW (ref 22–32)
Calcium: 8.1 mg/dL — ABNORMAL LOW (ref 8.9–10.3)
Chloride: 106 mmol/L (ref 98–111)
Creatinine, Ser: 1.59 mg/dL — ABNORMAL HIGH (ref 0.44–1.00)
GFR, Estimated: 35 mL/min — ABNORMAL LOW (ref >60)
Glucose, Bld: 282 mg/dL — ABNORMAL HIGH (ref 70–99)
Potassium: 4.2 mmol/L (ref 3.5–5.1)
Sodium: 134 mmol/L — ABNORMAL LOW (ref 135–145)
Total Bilirubin: 1 mg/dL (ref 0.3–1.2)
Total Protein: 6.4 g/dL — ABNORMAL LOW (ref 6.5–8.1)

## 2020-06-19 LAB — PROCALCITONIN: Procalcitonin: 0.1 ng/mL

## 2020-06-19 LAB — CBC
HCT: 39.4 % (ref 36.0–46.0)
Hemoglobin: 13 g/dL (ref 12.0–15.0)
MCH: 33.6 pg (ref 26.0–34.0)
MCHC: 33 g/dL (ref 30.0–36.0)
MCV: 101.8 fL — ABNORMAL HIGH (ref 80.0–100.0)
Platelets: 109 10*3/uL — ABNORMAL LOW (ref 150–400)
RBC: 3.87 MIL/uL (ref 3.87–5.11)
RDW: 15 % (ref 11.5–15.5)
WBC: 7.7 10*3/uL (ref 4.0–10.5)
nRBC: 0 % (ref 0.0–0.2)

## 2020-06-19 LAB — D-DIMER, QUANTITATIVE: D-Dimer, Quant: 0.34 ug/mL-FEU (ref 0.00–0.50)

## 2020-06-19 LAB — GLUCOSE, CAPILLARY
Glucose-Capillary: 239 mg/dL — ABNORMAL HIGH (ref 70–99)
Glucose-Capillary: 224 mg/dL — ABNORMAL HIGH (ref 70–99)

## 2020-06-19 LAB — C-REACTIVE PROTEIN: CRP: 2.6 mg/dL — ABNORMAL HIGH (ref <1.0)

## 2020-06-19 MED ORDER — METOPROLOL SUCCINATE ER 50 MG PO TB24
50.0000 mg | ORAL_TABLET | Freq: Every day | ORAL | Status: DC
  Administered 2020-06-19: 50 mg via ORAL

## 2020-06-19 MED ORDER — PREDNISONE 10 MG PO TABS: ORAL_TABLET | ORAL | 0 refills | Status: DC

## 2020-06-19 MED ORDER — SITAGLIPTIN PHOSPHATE 25 MG PO TABS: 25.0000 mg | ORAL_TABLET | Freq: Every day | ORAL | 0 refills | Status: DC

## 2020-06-19 MED ORDER — FUROSEMIDE 40 MG PO TABS: 40.0000 mg | ORAL_TABLET | Freq: Every day | ORAL | 0 refills | Status: DC | PRN

## 2020-06-19 MED ORDER — PREDNISONE 20 MG PO TABS
40.0000 mg | ORAL_TABLET | Freq: Every day | ORAL | Status: DC
  Administered 2020-06-19: 40 mg via ORAL
  Filled 2020-06-19: qty 2

## 2020-06-19 MED ORDER — LOPERAMIDE HCL 2 MG PO CAPS: 4.0000 mg | ORAL_CAPSULE | Freq: Two times a day (BID) | ORAL | 0 refills | Status: DC | PRN

## 2020-06-19 MED ORDER — BENZONATATE 100 MG PO CAPS: 100.0000 mg | ORAL_CAPSULE | Freq: Four times a day (QID) | ORAL | 0 refills | Status: DC | PRN

## 2020-06-19 MED ORDER — METOPROLOL SUCCINATE ER 50 MG PO TB24: 50.0000 mg | ORAL_TABLET | Freq: Every day | ORAL | 0 refills | Status: DC

## 2020-06-19 MED ORDER — BENZONATATE 100 MG PO CAPS: 100.0000 mg | ORAL_CAPSULE | Freq: Three times a day (TID) | ORAL | 0 refills | Status: DC | PRN

## 2020-06-19 MED ORDER — ALBUTEROL SULFATE HFA 108 (90 BASE) MCG/ACT IN AERS: 2.0000 | INHALATION_SPRAY | Freq: Four times a day (QID) | RESPIRATORY_TRACT | 0 refills | Status: DC | PRN

## 2020-06-19 MED ORDER — POTASSIUM CHLORIDE CRYS ER 20 MEQ PO TBCR: 20.0000 meq | EXTENDED_RELEASE_TABLET | Freq: Every day | ORAL | 0 refills | Status: DC | PRN

## 2020-06-19 MED ORDER — POTASSIUM CHLORIDE CRYS ER 20 MEQ PO TBCR: 20.0000 meq | EXTENDED_RELEASE_TABLET | Freq: Every day | ORAL | Status: DC | PRN

## 2020-06-19 MED ORDER — RIVAROXABAN 20 MG PO TABS: 20.0000 mg | ORAL_TABLET | Freq: Every day | ORAL | 0 refills | Status: DC

## 2020-06-19 MED FILL — LOPERAMIDE 2 MG CAPSULE: 2 | 7 days supply | Qty: 30 | Fill #0

## 2020-06-19 MED FILL — POTASSIUM CHLORIDE 20meqER: 20 | 30 days supply | Qty: 30 | Fill #0

## 2020-06-19 MED FILL — METOPROLOL SUCCINATE ER 50: 50 | 30 days supply | Qty: 30 | Fill #0

## 2020-06-19 MED FILL — JANUVIA 25 MG TABLET: 25 | 30 days supply | Qty: 30 | Fill #0

## 2020-06-19 MED FILL — FUROSEMIDE 40 MG TABLET: 40 | 30 days supply | Qty: 30 | Fill #0

## 2020-06-19 MED FILL — BENZONATATE 100 MG CAPS: 100 | 10 days supply | Qty: 30 | Fill #0

## 2020-06-19 MED FILL — XARELTO 20 MG TABLET: 20 | 30 days supply | Qty: 30 | Fill #0

## 2020-06-19 MED FILL — predniSONE 10 MG TABS: 10 | 4 days supply | Qty: 10 | Fill #0

## 2020-06-19 NOTE — Progress Notes (Addendum)
Patient was discharged home via [facility]. All belongings sent home with patient in patient belongings bag. Discharge instructions reviewed with patient. TOC meds given to patient and reviewed. IV and tele d/c'd. Patient on RA satting 96%.

## 2020-06-19 NOTE — TOC Transition Note (Signed)
Transition of Care [facility]) - CM/SW Discharge Note   Patient Details  Name: Kaitlyn [name] MRN: [id number] Date of Birth: 1950-09-08  Transition of Care [facility]) CM/SW Contact:  Leone Haven, RN Phone Number: 06/19/2020, 12:31 PM   Clinical Narrative:    Per previous note from previous NCM.--Spoke w patient over the phone to discuss support at discharge. She states that she lives by herself in a first floor apartment. She is WC bound, has a WC and a rollator that uses to scoot around her apartment. She discussed the services she uses in great detail such as, [employer] home delivery of groceries, [facility] home delivery of medication and sundries every M,W,F.  She describes in detail an extensive support system from neighbors across the hall who check on her multiple times daily, and get her mail and take out her trash. We discussed HH services and the patient repeatedly declined them.  She endorses needing transportation at DC, states she usually uses [facility].     Requested MD to send any scripts through [facility] at DC.  2/3- [facility] shceduled for 12:30 for transport.  Has follow up  [facility] [address].    Final next level of care: Home/Self Care Barriers to Discharge: No Barriers Identified   Patient Goals and CMS Choice Patient states their goals for this hospitalization and ongoing recovery are:: to go home CMS Medicare.gov Compare Post Acute Care list provided to:: Patient    Discharge Placement                       Discharge Plan and Services   Discharge Planning Services: CM Consult                                 Social Determinants of Health (SDOH) Interventions     Readmission Risk Interventions Readmission Risk Prevention Plan 10/17/2018  Transportation Screening Complete  PCP or Specialist Appt within 3-5 Days Not Complete  Not Complete comments plan to d/c to SNF  HRI or Home Care Consult Complete  Social Work  Consult for Recovery Care Planning/Counseling Complete  Palliative Care Screening Not Applicable  Medication Review [employer]) Complete  Some recent data might be hidden

## 2020-06-19 NOTE — Progress Notes (Addendum)
Seen and examined-she feels much better and is insisting on being discharged home today.  Per patient-she got her Social Security payment today-and needs to go home so that she can start paying her bills.  She was on her tablet this morning checking her bank accounts when I walked in.  She claims she really does not need any outpatient follow-up.  Tried to explain that she has mild developing AKI that probably will need to be followed by PCP-but she is not interested.  All she wants is to leave the hospital today.  Plan is to discharge patient home at her own request.

## 2020-06-19 NOTE — Discharge Summary (Signed)
PATIENT DETAILS Name: Kaitlyn [name] Age: 70 y.o. Sex: female Date of Birth: 1950-11-02 MRN: [id number]. Admitting Physician: Angie Fava, DO PCP:Pcp, No  Admit Date: 06/16/2020 Discharge date: 06/19/2020  Recommendations for Outpatient Follow-up:  1. Follow up with PCP in 1-2 weeks 2. Please obtain CMP/CBC in one week 3. Repeat Chest Xray in 4-6 week  Admitted From:  Home   Disposition: Home (Refused Home health!)   Home Health: No  Equipment/Devices: None  Discharge Condition: Stable  CODE STATUS: FULL CODE  Diet recommendation:  Diet Order            Diet - low sodium heart healthy           Diet Carb Modified           Diet regular Room service appropriate? Yes; Fluid consistency: Thin  Diet effective now                   Brief Narrative   70 y.o.femalewith medical history significant forchronic systolic heart failure, PAF, DM-2-presented to the ED with cough, shortness of breath-she was found to have acute hypoxic respiratory failure due to COVID-19 pneumonia and A. fib with RVR.  Significant studies: 1/31 >>chest x-ray: No obvious pneumonia. 2/3>> chest x-ray: Minimal left basilar subsegmental atelectasis or scarring.  Microbiology:  1/31>> blood culture: No growth 1/31>> urine culture: Multiple species  COVID-19 medications: Remdesivir: 1/31>> Steroids: 1/31>>  Brief Hospital Course: Acute Hypoxic Resp. Failure due to suspected acute Covid 19 pneumonia:  Although chest x-ray x2 was negative for pneumonia-given hypoxemia on initial presentation--suspicion that she probably had some mild pneumonitis.  She was treated with steroid/Remdesivir and quickly improved.  She is on room air.  She feels much better-she continues to have some cough but she is no longer short of breath.  She is mostly wheelchair-bound.  Since she feels significantly better today-she is requesting discharge-as she has to go home to pay her bills (received her  Social Security payment this morning).  On discharge-she will continue with tapering steroids, as needed antitussives.  Ideally she needs follow-up with her PCP in the next 1-2 weeks-however per patient-she does not have a PCP-and is not interested in finding one at this time-as she only wants to deal with "1 problem at a time".   COVID-19 Labs:  Recent Labs    06/16/20 1617 06/17/20 0807 06/18/20 0121 06/19/20 0128  DDIMER 1.07* 1.08* 0.68* 0.34  FERRITIN 69  --   --   --   LDH 180  --   --   --   CRP 9.3* 10.5* 5.6* 2.6*    Lab Results  Component Value Date   SARSCOV2NAA POSITIVE (A) 06/16/2020   SARSCOV2NAA NEGATIVE 04/17/2019   SARSCOV2NAA *Not Detected 10/30/2018   SARSCOV2NAA NEGATIVE 10/16/2018     A. fib with RVR: Rate controlled-no longer on Cardizem drip-per patient-she no longer is on digoxin-and stopped taking metoprolol several months before this hospitalization.  Will continue with low-dose of metoprolol on discharge-she previously apparently was having diarrhea with Eliquis and has been changed to Xarelto which she will continue on discharge.    Mild AKI: Likely hemodynamically mediated-creatinine slowly increasing over the past few days-however patient not interested in staying in the hospital-and wants to be discharged home-as she has bills to pay.  I have asked her to see if she will follow with her primary care practitioner-she apparently does not have 1-and does not intend to follow with any PCP as  well.  Per patient-she wants to deal with one issue at a time-and wants to recover completely from COVID before dealing with other issues.  I tried to explain to her that at this time all she needs is monitoring-but she was not ready to listen.  She is being discharged at own request.  Chronic systolic heart failure (EF 45-50% on 04/17/2019): Volume status stable-follow closely.  DM-2: CBGs relatively stable-treated with SSI and Tradjenta-plans are to resume Januvia on  discharge.  Chronic debility/deconditioning: She is wheelchair-bound-and lives by herself.  She has neighbors that provide extensive support for case management (confirmed by patient this morning).  She gets her medication and groceries delivered to her house.  She has been managing her own finances-and has been living this way for the past several years (confirmed by her family-who I spoke to yesterday).  She acknowledges she is estranged from her family. She refuses any sort of home health services.  Noncompliance to medication/follow-up: Appears to be a chronic issue-counseled extensively this morning (RN at bedside as well)-I suspect this is going to be a ongoing issue for this patient.  Estimated body mass index is 34.62 kg/m as calculated from the following:   Height as of this encounter: 5\' 11"  (1.803 m).   Weight as of this encounter: 112.6 kg.    Discharge Diagnoses:  Principal Problem:   COVID-19 virus infection Active Problems:   Diabetes mellitus, without long-term current use of insulin (HCC)   Atrial fibrillation with RVR (HCC)   Chronic systolic CHF (congestive heart failure) (HCC)   SOB (shortness of breath)   Discharge Instructions:    Person Under Monitoring Name: Kaitlyn [name]  Location: [address] [place] [address]   Infection Prevention Recommendations for Individuals Confirmed to have, or Being Evaluated for, 2019 Novel Coronavirus (COVID-19) Infection Who Receive Care at Home  Individuals who are confirmed to have, or are being evaluated for, COVID-19 should follow the prevention steps below until a healthcare provider or local or state health department says they can return to normal activities.  Stay home except to get medical care You should restrict activities outside your home, except for getting medical care. Do not go to work, school, or public areas, and do not use public transportation or taxis.  Call ahead before visiting your  doctor Before your medical appointment, call the healthcare provider and tell them that you have, or are being evaluated for, COVID-19 infection. This will help the healthcare provider's office take steps to keep other people from getting infected. Ask your healthcare provider to call the local or state health department.  Monitor your symptoms Seek prompt medical attention if your illness is worsening (e.g., difficulty breathing). Before going to your medical appointment, call the healthcare provider and tell them that you have, or are being evaluated for, COVID-19 infection. Ask your healthcare provider to call the local or state health department.  Wear a facemask You should wear a facemask that covers your nose and mouth when you are in the same room with other people and when you visit a healthcare provider. People who live with or visit you should also wear a facemask while they are in the same room with you.  Separate yourself from other people in your home As much as possible, you should stay in a different room from other people in your home. Also, you should use a separate bathroom, if available.  Avoid sharing household items You should not share dishes,  drinking glasses, cups, eating utensils, towels, bedding, or other items with other people in your home. After using these items, you should wash them thoroughly with soap and water.  Cover your coughs and sneezes Cover your mouth and nose with a tissue when you cough or sneeze, or you can cough or sneeze into your sleeve. Throw used tissues in a lined trash can, and immediately wash your hands with soap and water for at least 20 seconds or use an alcohol-based hand rub.  Wash your [employer] your hands often and thoroughly with soap and water for at least 20 seconds. You can use an alcohol-based hand sanitizer if soap and water are not available and if your hands are not visibly dirty. Avoid touching your eyes, nose,  and mouth with unwashed hands.   Prevention Steps for Caregivers and Household Members of Individuals Confirmed to have, or Being Evaluated for, COVID-19 Infection Being Cared for in the Home  If you live with, or provide care at home for, a person confirmed to have, or being evaluated for, COVID-19 infection please follow these guidelines to prevent infection:  Follow healthcare provider's instructions Make sure that you understand and can help the patient follow any healthcare provider instructions for all care.  Provide for the patient's basic needs You should help the patient with basic needs in the home and provide support for getting groceries, prescriptions, and other personal needs.  Monitor the patient's symptoms If they are getting sicker, call his or her medical provider and tell them that the patient has, or is being evaluated for, COVID-19 infection. This will help the healthcare provider's office take steps to keep other people from getting infected. Ask the healthcare provider to call the local or state health department.  Limit the number of people who have contact with the patient  If possible, have only one caregiver for the patient.  Other household members should stay in another home or place of residence. If this is not possible, they should stay  in another room, or be separated from the patient as much as possible. Use a separate bathroom, if available.  Restrict visitors who do not have an essential need to be in the home.  Keep older adults, very young children, and other sick people away from the patient Keep older adults, very young children, and those who have compromised immune systems or chronic health conditions away from the patient. This includes people with chronic heart, lung, or kidney conditions, diabetes, and cancer.  Ensure good ventilation Make sure that shared spaces in the home have good air flow, such as from an air conditioner or an  opened window, weather permitting.  Wash your hands often  Wash your hands often and thoroughly with soap and water for at least 20 seconds. You can use an alcohol based hand sanitizer if soap and water are not available and if your hands are not visibly dirty.  Avoid touching your eyes, nose, and mouth with unwashed hands.  Use disposable paper towels to dry your hands. If not available, use dedicated cloth towels and replace them when they become wet.  Wear a facemask and gloves  Wear a disposable facemask at all times in the room and gloves when you touch or have contact with the patient's blood, body fluids, and/or secretions or excretions, such as sweat, saliva, sputum, nasal mucus, vomit, urine, or feces.  Ensure the mask fits over your nose and mouth tightly, and do not touch it during use.  Throw out disposable facemasks and gloves after using them. Do not reuse.  Wash your hands immediately after removing your facemask and gloves.  If your personal clothing becomes contaminated, carefully remove clothing and launder. Wash your hands after handling contaminated clothing.  Place all used disposable facemasks, gloves, and other waste in a lined container before disposing them with other household waste.  Remove gloves and wash your hands immediately after handling these items.  Do not share dishes, glasses, or other household items with the patient  Avoid sharing household items. You should not share dishes, drinking glasses, cups, eating utensils, towels, bedding, or other items with a patient who is confirmed to have, or being evaluated for, COVID-19 infection.  After the person uses these items, you should wash them thoroughly with soap and water.  Wash laundry thoroughly  Immediately remove and wash clothes or bedding that have blood, body fluids, and/or secretions or excretions, such as sweat, saliva, sputum, nasal mucus, vomit, urine, or feces, on them.  Wear gloves  when handling laundry from the patient.  Read and follow directions on labels of laundry or clothing items and detergent. In general, wash and dry with the warmest temperatures recommended on the label.  Clean all areas the individual has used often  Clean all touchable surfaces, such as counters, tabletops, doorknobs, bathroom fixtures, toilets, phones, keyboards, tablets, and bedside tables, every day. Also, clean any surfaces that may have blood, body fluids, and/or secretions or excretions on them.  Wear gloves when cleaning surfaces the patient has come in contact with.  Use a diluted bleach solution (e.g., dilute bleach with 1 part bleach and 10 parts water) or a household disinfectant with a label that says EPA-registered for coronaviruses. To make a bleach solution at home, add 1 tablespoon of bleach to 1 quart (4 cups) of water. For a larger supply, add  cup of bleach to 1 gallon (16 cups) of water.  Read labels of cleaning products and follow recommendations provided on product labels. Labels contain instructions for safe and effective use of the cleaning product including precautions you should take when applying the product, such as wearing gloves or eye protection and making sure you have good ventilation during use of the product.  Remove gloves and wash hands immediately after cleaning.  Monitor yourself for signs and symptoms of illness Caregivers and household members are considered close contacts, should monitor their health, and will be asked to limit movement outside of the home to the extent possible. Follow the monitoring steps for close contacts listed on the symptom monitoring form.   ? If you have additional questions, contact your local health department or call the epidemiologist on call at [phone] (available 24/7). ? This guidance is subject to change. For the most up-to-date guidance from CDC, please refer to their  website: [employer]    Activity:  As tolerated with Full fall precautions use walker/cane & assistance as needed  Discharge Instructions    (HEART FAILURE PATIENTS) Call MD:  Anytime you have any of the following symptoms: 1) 3 pound weight gain in 24 hours or 5 pounds in 1 week 2) shortness of breath, with or without a dry hacking cough 3) swelling in the hands, feet or stomach 4) if you have to sleep on extra pillows at night in order to breathe.   Complete by: As directed    Amb referral to [facility]   Complete by: As directed    Call MD for:  difficulty  breathing, headache or visual disturbances   Complete by: As directed    Diet - low sodium heart healthy   Complete by: As directed    Diet Carb Modified   Complete by: As directed    Discharge instructions   Complete by: As directed    1.)  10 days of isolation from 06/16/2020  2.)  If you develop worsening shortness of breath-please seek immediate medical attention.  3.)  You will need repeat chemistry panel in 1 week-please call your primary care practitioner and get this scheduled.  You have mild kidney failure-and you need this to be monitored closely.  4.)  It is very imperative/important that you take your medications as prescribed-and have close follow-up with your heart doctors and primary care practitioner.   Follow with Primary MD in 1-2 weeks  Please get a complete blood count and chemistry panel checked by your Primary MD at your next visit, and again as instructed by your Primary MD.  Get Medicines reviewed and adjusted: Please take all your medications with you for your next visit with your Primary MD  Laboratory/radiological data: Please request your Primary MD to go over all hospital tests and procedure/radiological results at the follow up, please ask your Primary MD to get all Hospital records sent to his/her office.  In some cases, they will  be blood work, cultures and biopsy results pending at the time of your discharge. Please request that your primary care M.D. follows up on these results.  Also Note the following: If you experience worsening of your admission symptoms, develop shortness of breath, life threatening emergency, suicidal or homicidal thoughts you must seek medical attention immediately by calling 911 or calling your MD immediately  if symptoms less severe.  You must read complete instructions/literature along with all the possible adverse reactions/side effects for all the Medicines you take and that have been prescribed to you. Take any new Medicines after you have completely understood and accpet all the possible adverse reactions/side effects.   Do not drive when taking Pain medications or sleeping medications (Benzodaizepines)  Do not take more than prescribed Pain, Sleep and Anxiety Medications. It is not advisable to combine anxiety,sleep and pain medications without talking with your primary care practitioner  Special Instructions: If you have smoked or chewed Tobacco  in the last 2 yrs please stop smoking, stop any regular Alcohol  and or any Recreational drug use.  Wear Seat belts while driving.  Please note: You were cared for by a hospitalist during your hospital stay. Once you are discharged, your primary care physician will handle any further medical issues. Please note that NO REFILLS for any discharge medications will be authorized once you are discharged, as it is imperative that you return to your primary care physician (or establish a relationship with a primary care physician if you do not have one) for your post hospital discharge needs so that they can reassess your need for medications and monitor your lab values.   Increase activity slowly   Complete by: As directed      Allergies as of 06/19/2020      Reactions   Caffeine Palpitations, Other (See Comments)   Seizure from large doses, heart  races from small doses   Cranberry Shortness Of Breath, Diarrhea, Itching, Other (See Comments)   Severe headache.."Ocean Spray cranberry juice"   Lisinopril Diarrhea, Itching, Swelling   Site of swelling not recalled   Penicillins Other (See Comments)   Unknown childhood  allergy Has patient had a PCN reaction causing immediate rash, facial/tongue/throat swelling, SOB or lightheadedness with hypotension: unknown Has patient had a PCN reaction causing severe rash involving mucus membranes or skin necrosis: unknown Has patient had a PCN reaction that required hospitalization unknown Has patient had a PCN reaction occurring within the last 10 years: no If all of the above answers are "NO", then may proceed with Cephalosporin use.   Cranberry Juice Concentrate [place] Neat Extract]    Headaches   Other Other (See Comments)   Stimulants- patient has a past history of seizures      Medication List    STOP taking these medications   Digox 0.125 MG tablet Generic drug: digoxin   docusate sodium 100 MG capsule Commonly known as: COLACE   polyethylene glycol 17 g packet Commonly known as: MIRALAX / GLYCOLAX   potassium chloride 20 MEQ/15ML (10%) Soln     TAKE these medications   albuterol 108 (90 Base) MCG/ACT inhaler Commonly known as: VENTOLIN HFA Inhale 2 puffs into the lungs every 6 (six) hours as needed for wheezing or shortness of breath.   benzonatate 100 MG capsule Commonly known as: Tessalon Perles Take 1 capsule (100 mg total) by mouth every 6 (six) hours as needed for cough.   furosemide 40 MG tablet Commonly known as: LASIX Take 1 tablet (40 mg total) by mouth daily as needed for fluid or edema (take your potassium when you take lasix).   loperamide 2 MG capsule Commonly known as: IMODIUM Take 2 capsules (4 mg total) by mouth 2 (two) times daily as needed for diarrhea or loose stools.   metoprolol succinate 50 MG 24 hr tablet Commonly known as: TOPROL-XL Take 1  tablet (50 mg total) by mouth daily. Take with or immediately following a meal. What changed: when to take this   potassium chloride SA 20 MEQ tablet Commonly known as: KLOR-CON Take 1 tablet (20 mEq total) by mouth daily as needed (when u take lasix).   predniSONE 10 MG tablet Commonly known as: DELTASONE Take 40 mg daily for 1 day, 30 mg daily for 1 day, 20 mg daily for 1 days,10 mg daily for 1 day, then stop   rivaroxaban 20 MG Tabs tablet Commonly known as: XARELTO Take 1 tablet (20 mg total) by mouth daily with supper.   sitaGLIPtin 25 MG tablet Commonly known as: JANUVIA Take 1 tablet (25 mg total) by mouth daily.       Follow-up Information    Primary care MD. Schedule an appointment as soon as possible for a visit in 1 week(s).              Allergies  Allergen Reactions  . Caffeine Palpitations and Other (See Comments)    Seizure from large doses, heart races from small doses  . Cranberry Shortness Of Breath, Diarrhea, Itching and Other (See Comments)    Severe headache.."Ocean Spray cranberry juice"  . Lisinopril Diarrhea, Itching and Swelling    Site of swelling not recalled  . Penicillins Other (See Comments)    Unknown childhood allergy Has patient had a PCN reaction causing immediate rash, facial/tongue/throat swelling, SOB or lightheadedness with hypotension: unknown Has patient had a PCN reaction causing severe rash involving mucus membranes or skin necrosis: unknown Has patient had a PCN reaction that required hospitalization unknown Has patient had a PCN reaction occurring within the last 10 years: no If all of the above answers are "NO", then may proceed with Cephalosporin use.   [name]  Cranberry Juice Concentrate [Cranberry Extract]     Headaches  . Other Other (See Comments)    Stimulants- patient has a past history of seizures       Other Procedures/Studies: DG Chest Portable 1 View  Result Date: 06/16/2020 CLINICAL DATA:  Shortness of breath  EXAM: PORTABLE CHEST 1 VIEW COMPARISON:  Chest x-ray 04/17/2019 FINDINGS: The heart size and mediastinal contours are unchanged with persistent cardiomegaly. Aortic arch calcification. Bibasilar atelectasis. No focal consolidation. No pulmonary edema. No pleural effusion. No pneumothorax. No acute osseous abnormality. IMPRESSION: No active disease. Electronically Signed   By: Tish Frederickson M.D.   On: 06/16/2020 04:57   DG Chest Port 1V same Day  Result Date: 06/19/2020 CLINICAL DATA:  Shortness of breath. EXAM: PORTABLE CHEST 1 VIEW COMPARISON:  June 16, 2020. FINDINGS: Stable cardiomegaly. No pneumothorax or pleural effusion is noted. Right lung is clear. Minimal left basilar subsegmental atelectasis or scarring is noted. The visualized skeletal structures are unremarkable. IMPRESSION: Minimal left basilar subsegmental atelectasis or scarring. Electronically Signed   By: Lupita Raider M.D.   On: 06/19/2020 09:12     TODAY-DAY OF DISCHARGE:  Subjective:   Kaitlyn [name] today has no headache,no chest abdominal pain,no new weakness tingling or numbness, feels much better wants to go home today.   Objective:   Blood pressure (!) 108/99, pulse 73, temperature 97.8 F (36.6 C), temperature source Oral, resp. rate 20, height 5\' 11"  (1.803 m), weight 112.6 kg, SpO2 95 %.  Intake/Output Summary (Last 24 hours) at 06/19/2020 1001 Last data filed at 06/19/2020 0913 Gross per 24 hour  Intake 720 ml  Output 1700 ml  Net -980 ml   Filed Weights   06/17/20 2000 06/18/20 0443 06/19/20 0346  Weight: 111.4 kg 111.4 kg 112.6 kg    Exam: Awake Alert, Oriented *3, No new F.N deficits, Normal affect [place].AT,PERRAL Supple Neck,No JVD, No cervical lymphadenopathy appriciated.  Symmetrical Chest wall movement, Good air movement bilaterally, CTAB RRR,No Gallops,Rubs or new Murmurs, No Parasternal Heave +ve B.Sounds, Abd Soft, Non tender, No organomegaly appriciated, No rebound -guarding or rigidity. No  Cyanosis, Clubbing or edema, No new Rash or bruise   PERTINENT RADIOLOGIC STUDIES: DG Chest Portable 1 View  Result Date: 06/16/2020 CLINICAL DATA:  Shortness of breath EXAM: PORTABLE CHEST 1 VIEW COMPARISON:  Chest x-ray 04/17/2019 FINDINGS: The heart size and mediastinal contours are unchanged with persistent cardiomegaly. Aortic arch calcification. Bibasilar atelectasis. No focal consolidation. No pulmonary edema. No pleural effusion. No pneumothorax. No acute osseous abnormality. IMPRESSION: No active disease. Electronically Signed   By: Tish Frederickson M.D.   On: 06/16/2020 04:57   DG Chest Port 1V same Day  Result Date: 06/19/2020 CLINICAL DATA:  Shortness of breath. EXAM: PORTABLE CHEST 1 VIEW COMPARISON:  June 16, 2020. FINDINGS: Stable cardiomegaly. No pneumothorax or pleural effusion is noted. Right lung is clear. Minimal left basilar subsegmental atelectasis or scarring is noted. The visualized skeletal structures are unremarkable. IMPRESSION: Minimal left basilar subsegmental atelectasis or scarring. Electronically Signed   By: Lupita Raider M.D.   On: 06/19/2020 09:12     PERTINENT LAB RESULTS: CBC: Recent Labs    06/18/20 0121 06/19/20 0128  WBC 8.8 7.7  HGB 12.9 13.0  HCT 40.2 39.4  PLT 114* 109*   CMET CMP     Component Value Date/Time   NA 134 (L) 06/19/2020 0128   NA 141 10/24/2018 0000   K 4.2 06/19/2020 0128   CL 106  06/19/2020 0128   CO2 19 (L) 06/19/2020 0128   GLUCOSE 282 (H) 06/19/2020 0128   BUN 34 (H) 06/19/2020 0128   BUN 15 10/24/2018 0000   CREATININE 1.59 (H) 06/19/2020 0128   CALCIUM 8.1 (L) 06/19/2020 0128   PROT 6.4 (L) 06/19/2020 0128   ALBUMIN 3.1 (L) 06/19/2020 0128   AST 15 06/19/2020 0128   ALT 10 06/19/2020 0128   ALKPHOS 48 06/19/2020 0128   BILITOT 1.0 06/19/2020 0128   GFRNONAA 35 (L) 06/19/2020 0128   GFRAA >60 04/17/2019 0001    GFR Estimated Creatinine Clearance: 45.5 mL/min (A) (by C-G formula based on SCr of 1.59  mg/dL (H)). No results for input(s): LIPASE, AMYLASE in the last 72 hours. No results for input(s): CKTOTAL, CKMB, CKMBINDEX, TROPONINI in the last 72 hours. Invalid input(s): POCBNP Recent Labs    06/18/20 0121 06/19/20 0128  DDIMER 0.68* 0.34   No results for input(s): HGBA1C in the last 72 hours. No results for input(s): CHOL, HDL, LDLCALC, TRIG, CHOLHDL, LDLDIRECT in the last 72 hours. No results for input(s): TSH, T4TOTAL, T3FREE, THYROIDAB in the last 72 hours.  Invalid input(s): FREET3 Recent Labs    06/16/20 1617  FERRITIN 69   Coags: No results for input(s): INR in the last 72 hours.  Invalid input(s): PT Microbiology: Recent Results (from the past 240 hour(s))  SARS Coronavirus 2 by RT PCR (hospital order, performed [facility]) Nasopharyngeal Nasopharyngeal Swab     Status: Abnormal   Collection Time: 06/16/20  4:20 AM   Specimen: Nasopharyngeal Swab  Result Value Ref Range Status   SARS Coronavirus 2 POSITIVE (A) NEGATIVE Final    Comment: RESULT CALLED TO, READ BACK BY AND VERIFIED WITH: Caprice BeaverM. CRICHTON,RN [id number] 06/16/2020 T. TYSOR (NOTE) SARS-CoV-2 target nucleic acids are DETECTED  SARS-CoV-2 RNA is generally detectable in upper respiratory specimens  during the acute phase of infection.  Positive results are indicative  of the presence of the identified virus, but do not rule out bacterial infection or co-infection with other pathogens not detected by the test.  Clinical correlation with patient history and  other diagnostic information is necessary to determine patient infection status.  The expected result is negative.  Fact Sheet for Patients:   BoilerBrush.com.cyhttps://www.fda.gov/media/136312/download   Fact Sheet for Healthcare Providers:   https://pope.com/https://www.fda.gov/media/136313/download    This test is not yet approved or cleared by the [place] FDA and  has been authorized for detection and/or diagnosis of SARS-CoV-2 by FDA under an Emergency Use  Authorization (EUA).  This EUA will remain in effect (meaning th is test can be used) for the duration of  the COVID-19 declaration under Section 564(b)(1) of the Act, 21 U.S.C. section 360-bbb-3(b)(1), unless the authorization is terminated or revoked sooner.  Performed at [facility], [address] [place], [place] [address]   Blood culture (routine single)     Status: None (Preliminary result)   Collection Time: 06/16/20  4:48 AM   Specimen: BLOOD  Result Value Ref Range Status   Specimen Description BLOOD RIGHT WRIST  Final   Special Requests   Final    BOTTLES DRAWN AEROBIC AND ANAEROBIC Blood Culture adequate volume   Culture   Final    NO GROWTH 3 DAYS Performed at [facility], [address] [place], [place] [address]    Report Status PENDING  Incomplete  Urine culture     Status: Abnormal   Collection Time: 06/16/20  5:50 AM   Specimen: In/Out Cath Urine  Result Value Ref Range Status   Specimen Description IN/OUT CATH URINE  Final   Special Requests   Final    NONE Performed at [facility], [address] [place], [place] [address]    Culture MULTIPLE SPECIES PRESENT, SUGGEST RECOLLECTION (A)  Final   Report Status 06/17/2020 FINAL  Final    FURTHER DISCHARGE INSTRUCTIONS:  Get Medicines reviewed and adjusted: Please take all your medications with you for your next visit with your Primary MD  Laboratory/radiological data: Please request your Primary MD to go over all hospital tests and procedure/radiological results at the follow up, please ask your Primary MD to get all Hospital records sent to his/her office.  In some cases, they will be blood work, cultures and biopsy results pending at the time of your discharge. Please request that your primary care M.D. goes through all the records of your hospital data and follows up on these results.  Also Note the following: If you experience worsening of your admission symptoms, develop  shortness of breath, life threatening emergency, suicidal or homicidal thoughts you must seek medical attention immediately by calling 911 or calling your MD immediately  if symptoms less severe.  You must read complete instructions/literature along with all the possible adverse reactions/side effects for all the Medicines you take and that have been prescribed to you. Take any new Medicines after you have completely understood and accpet all the possible adverse reactions/side effects.   Do not drive when taking Pain medications or sleeping medications (Benzodaizepines)  Do not take more than prescribed Pain, Sleep and Anxiety Medications. It is not advisable to combine anxiety,sleep and pain medications without talking with your primary care practitioner  Special Instructions: If you have smoked or chewed Tobacco  in the last 2 yrs please stop smoking, stop any regular Alcohol  and or any Recreational drug use.  Wear Seat belts while driving.  Please note: You were cared for by a hospitalist during your hospital stay. Once you are discharged, your primary care physician will handle any further medical issues. Please note that NO REFILLS for any discharge medications will be authorized once you are discharged, as it is imperative that you return to your primary care physician (or establish a relationship with a primary care physician if you do not have one) for your post hospital discharge needs so that they can reassess your need for medications and monitor your lab values.  Total Time spent coordinating discharge including counseling, education and face to face time equals 35 minutes.  Signed: Arrie Zuercher 06/19/2020 10:01 AM

## 2020-06-19 NOTE — Progress Notes (Signed)
Inpatient Diabetes Program Recommendations  AACE/ADA: New Consensus Statement on Inpatient Glycemic Control (2015)  Target Ranges:  Prepandial:   less than 140 mg/dL      Peak postprandial:   less than 180 mg/dL (1-2 hours)      Critically ill patients:  140 - 180 mg/dL   Lab Results  Component Value Date   GLUCAP 224 (H) 06/19/2020   HGBA1C 5.7 (H) 04/17/2019    Review of Glycemic Control Results for SYMPHONY, DEMURO (MRN [id number]) as of 06/19/2020 12:32  Ref. Range 06/18/2020 12:06 06/18/2020 17:42 06/18/2020 20:38 06/19/2020 07:47 06/19/2020 12:26  Glucose-Capillary Latest Ref Range: 70 - 99 mg/dL 832 (H) 549 (H) 826 (H) 239 (H) 224 (H)   Diabetes history:  DM2 Outpatient Diabetes medications:  Januvia 10 mg daily Current orders for Inpatient glycemic control:  Tradjenta 5 mg daily Novolog 0-9 units TID  Prednisone 40 mg daily  Inpatient Diabetes Program Recommendations:     While on steroids and inpatient, please consider:  Levemir 8 units BID  Will continue to follow while inpatient.  Thank you, Dulce Sellar, RN, BSN Diabetes Coordinator Inpatient Diabetes Program [phone] (team pager from 8a-5p)

## 2020-06-20 ENCOUNTER — Telehealth (HOSPITAL_COMMUNITY): Payer: Self-pay

## 2020-06-20 NOTE — Telephone Encounter (Signed)
Pharmacy Transitions of Care Follow-up Telephone Call  Date of discharge: 2050-05-12 Discharge Diagnosis: COVID and Afib  How have you been since you were released from the hospital? Patient is doing well. She's taken one dose of her xarelto so far. She used to be on eliquis and she stopped it because it gave her diarrhea. She'll wait to see if this has the same effect, she did say she would call the doctor if she decides to stop the Xarelto. Went over s/sx of bleeding again (went over it in previous call in 05/21/20).   Medication changes made at discharge: yes  Medication changes obtained and verified? yes    Medication Accessibility:  Patient hung up on me before I was able to ask about her home pharmacy. She doesn't have refills at this time.   Medication Review:   RIVAROXABAN (XARELTO)  20mg  started on 06/19/20 - Discussed importance of taking medication with food and around the same time everyday  - Advised patient of medications to avoid (NSAIDs, ASA)  - Educated that Tylenol (acetaminophen) will be the preferred analgesic to prevent risk of bleeding  - Emphasized importance of monitoring for signs and symptoms of bleeding (abnormal bruising, prolonged bleeding, nose bleeds, bleeding from gums, discolored urine, black tarry stools)  - Advised patient to alert all providers of anticoagulation therapy prior to starting a new medication or having a procedure    Follow-up Appointments:  Patient has a follow up scheduled with 08/17/20 in IM on 07/08/20 but did not know about it, nor does she plan to go. She states she is disabled and does not have a car. When asked if she would like patient assistance to get to her appointment, she declined and said she had no interest in more follow ups. She thought she could ask the hospitalist for more refills when she runs out, but I tried to explain this was not the case. When I told her the hospitalist doesn't do refills, she hung up on me. Will  message IM to let them know what has happened.  Final Patient Assessment: Patient seems to be doing well. Is going to try to stay on Xarelto but has no interest in follow ups at this time.

## 2020-06-21 LAB — CULTURE, BLOOD (SINGLE)
Culture: NO GROWTH
Special Requests: ADEQUATE

## 2020-07-08 ENCOUNTER — Telehealth: Payer: Self-pay | Admitting: Family Medicine

## 2020-09-10 ENCOUNTER — Inpatient Hospital Stay (HOSPITAL_COMMUNITY)
Admission: EM | Admit: 2020-09-10 | Discharge: 2020-09-16 | DRG: 392 | Disposition: A | Discharge status: Home / Self Care | Payer: Medicare (Managed Care) | Attending: Family Medicine | Admitting: Family Medicine

## 2020-09-10 ENCOUNTER — Other Ambulatory Visit: Payer: Self-pay

## 2020-09-10 ENCOUNTER — Encounter (HOSPITAL_COMMUNITY): Payer: Self-pay | Admitting: Emergency Medicine

## 2020-09-10 DIAGNOSIS — Z79899 Other long term (current) drug therapy: Secondary | ICD-10-CM

## 2020-09-10 DIAGNOSIS — E669 Obesity, unspecified: Secondary | ICD-10-CM | POA: Diagnosis present

## 2020-09-10 DIAGNOSIS — D649 Anemia, unspecified: Secondary | ICD-10-CM | POA: Diagnosis present

## 2020-09-10 DIAGNOSIS — R21 Rash and other nonspecific skin eruption: Secondary | ICD-10-CM

## 2020-09-10 DIAGNOSIS — I110 Hypertensive heart disease with heart failure: Secondary | ICD-10-CM | POA: Diagnosis present

## 2020-09-10 DIAGNOSIS — Z888 Allergy status to other drugs, medicaments and biological substances status: Secondary | ICD-10-CM

## 2020-09-10 DIAGNOSIS — I5022 Chronic systolic (congestive) heart failure: Secondary | ICD-10-CM | POA: Diagnosis present

## 2020-09-10 DIAGNOSIS — K529 Noninfective gastroenteritis and colitis, unspecified: Secondary | ICD-10-CM | POA: Diagnosis present

## 2020-09-10 DIAGNOSIS — F0390 Unspecified dementia without behavioral disturbance: Secondary | ICD-10-CM | POA: Diagnosis present

## 2020-09-10 DIAGNOSIS — Z880 Allergy status to penicillin: Secondary | ICD-10-CM

## 2020-09-10 DIAGNOSIS — Z91018 Allergy to other foods: Secondary | ICD-10-CM

## 2020-09-10 DIAGNOSIS — E119 Type 2 diabetes mellitus without complications: Secondary | ICD-10-CM | POA: Diagnosis present

## 2020-09-10 DIAGNOSIS — Z9119 Patient's noncompliance with other medical treatment and regimen: Secondary | ICD-10-CM

## 2020-09-10 DIAGNOSIS — Z659 Problem related to unspecified psychosocial circumstances: Secondary | ICD-10-CM

## 2020-09-10 DIAGNOSIS — I4811 Longstanding persistent atrial fibrillation: Secondary | ICD-10-CM

## 2020-09-10 DIAGNOSIS — Z8616 Personal history of COVID-19: Secondary | ICD-10-CM

## 2020-09-10 DIAGNOSIS — B888 Other specified infestations: Secondary | ICD-10-CM | POA: Diagnosis present

## 2020-09-10 DIAGNOSIS — Z91199 Patient's noncompliance with other medical treatment and regimen due to unspecified reason: Secondary | ICD-10-CM

## 2020-09-10 DIAGNOSIS — R197 Diarrhea, unspecified: Principal | ICD-10-CM | POA: Diagnosis present

## 2020-09-10 DIAGNOSIS — I2510 Atherosclerotic heart disease of native coronary artery without angina pectoris: Secondary | ICD-10-CM | POA: Diagnosis present

## 2020-09-10 DIAGNOSIS — I482 Chronic atrial fibrillation, unspecified: Secondary | ICD-10-CM | POA: Diagnosis present

## 2020-09-10 DIAGNOSIS — Z833 Family history of diabetes mellitus: Secondary | ICD-10-CM

## 2020-09-10 DIAGNOSIS — L309 Dermatitis, unspecified: Secondary | ICD-10-CM | POA: Diagnosis present

## 2020-09-10 DIAGNOSIS — Z6834 Body mass index (BMI) 34.0-34.9, adult: Secondary | ICD-10-CM

## 2020-09-10 DIAGNOSIS — R569 Unspecified convulsions: Secondary | ICD-10-CM

## 2020-09-10 DIAGNOSIS — I252 Old myocardial infarction: Secondary | ICD-10-CM

## 2020-09-10 DIAGNOSIS — I10 Essential (primary) hypertension: Secondary | ICD-10-CM | POA: Diagnosis present

## 2020-09-10 DIAGNOSIS — Z9114 Patient's other noncompliance with medication regimen: Secondary | ICD-10-CM

## 2020-09-10 DIAGNOSIS — Z609 Problem related to social environment, unspecified: Secondary | ICD-10-CM | POA: Diagnosis present

## 2020-09-10 DIAGNOSIS — Z20822 Contact with and (suspected) exposure to covid-19: Secondary | ICD-10-CM | POA: Diagnosis present

## 2020-09-10 NOTE — ED Triage Notes (Addendum)
Per EMS, pt from home c/o skin rash and diarrhea.  She needed decon b/c per EMS her house was crawling w/ bedbugs, described as a massive infestation.  Pt has not been med compliant for three months.  Reports of sores on body.  Denied any problems walking however told EMS she was having difficulty

## 2020-09-11 DIAGNOSIS — Z659 Problem related to unspecified psychosocial circumstances: Secondary | ICD-10-CM

## 2020-09-11 DIAGNOSIS — Z9119 Patient's noncompliance with other medical treatment and regimen: Secondary | ICD-10-CM

## 2020-09-11 DIAGNOSIS — K591 Functional diarrhea: Secondary | ICD-10-CM

## 2020-09-11 DIAGNOSIS — B888 Other specified infestations: Secondary | ICD-10-CM

## 2020-09-11 DIAGNOSIS — D649 Anemia, unspecified: Secondary | ICD-10-CM

## 2020-09-11 DIAGNOSIS — I482 Chronic atrial fibrillation, unspecified: Secondary | ICD-10-CM | POA: Diagnosis not present

## 2020-09-11 DIAGNOSIS — D531 Other megaloblastic anemias, not elsewhere classified: Secondary | ICD-10-CM | POA: Diagnosis not present

## 2020-09-11 DIAGNOSIS — I10 Essential (primary) hypertension: Secondary | ICD-10-CM

## 2020-09-11 DIAGNOSIS — I5022 Chronic systolic (congestive) heart failure: Secondary | ICD-10-CM

## 2020-09-11 DIAGNOSIS — R569 Unspecified convulsions: Secondary | ICD-10-CM

## 2020-09-11 DIAGNOSIS — K529 Noninfective gastroenteritis and colitis, unspecified: Secondary | ICD-10-CM | POA: Diagnosis not present

## 2020-09-11 DIAGNOSIS — I110 Hypertensive heart disease with heart failure: Secondary | ICD-10-CM

## 2020-09-11 DIAGNOSIS — E119 Type 2 diabetes mellitus without complications: Secondary | ICD-10-CM

## 2020-09-11 LAB — HEMOGLOBIN A1C
Hgb A1c MFr Bld: 5.2 % (ref 4.8–5.6)
Mean Plasma Glucose: 102.54 mg/dL

## 2020-09-11 LAB — CBC
HCT: 24.1 % — ABNORMAL LOW (ref 36.0–46.0)
Hemoglobin: 7.1 g/dL — ABNORMAL LOW (ref 12.0–15.0)
MCH: 32 pg (ref 26.0–34.0)
MCHC: 29.5 g/dL — ABNORMAL LOW (ref 30.0–36.0)
MCV: 108.6 fL — ABNORMAL HIGH (ref 80.0–100.0)
Platelets: 181 10*3/uL (ref 150–400)
RBC: 2.22 MIL/uL — ABNORMAL LOW (ref 3.87–5.11)
RDW: 16 % — ABNORMAL HIGH (ref 11.5–15.5)
WBC: 5.8 10*3/uL (ref 4.0–10.5)
nRBC: 0 % (ref 0.0–0.2)

## 2020-09-11 LAB — COMPREHENSIVE METABOLIC PANEL
ALT: 9 U/L (ref 0–44)
AST: 14 U/L — ABNORMAL LOW (ref 15–41)
Albumin: 3 g/dL — ABNORMAL LOW (ref 3.5–5.0)
Alkaline Phosphatase: 54 U/L (ref 38–126)
Anion gap: 5 (ref 5–15)
BUN: 14 mg/dL (ref 8–23)
CO2: 22 mmol/L (ref 22–32)
Calcium: 8.2 mg/dL — ABNORMAL LOW (ref 8.9–10.3)
Chloride: 110 mmol/L (ref 98–111)
Creatinine, Ser: 1.15 mg/dL — ABNORMAL HIGH (ref 0.44–1.00)
GFR, Estimated: 51 mL/min — ABNORMAL LOW (ref >60)
Glucose, Bld: 117 mg/dL — ABNORMAL HIGH (ref 70–99)
Potassium: 4.1 mmol/L (ref 3.5–5.1)
Sodium: 137 mmol/L (ref 135–145)
Total Bilirubin: 0.4 mg/dL (ref 0.3–1.2)
Total Protein: 6.8 g/dL (ref 6.5–8.1)

## 2020-09-11 LAB — IRON AND TIBC
Iron: 24 ug/dL — ABNORMAL LOW (ref 28–170)
Saturation Ratios: 7 % — ABNORMAL LOW (ref 10.4–31.8)
TIBC: 349 ug/dL (ref 250–450)
UIBC: 325 ug/dL

## 2020-09-11 LAB — CBC WITH DIFFERENTIAL/PLATELET
Abs Immature Granulocytes: 0.02 10*3/uL (ref 0.00–0.07)
Basophils Absolute: 0 10*3/uL (ref 0.0–0.1)
Basophils Relative: 1 %
Eosinophils Absolute: 0.7 10*3/uL — ABNORMAL HIGH (ref 0.0–0.5)
Eosinophils Relative: 9 %
HCT: 25.5 % — ABNORMAL LOW (ref 36.0–46.0)
Hemoglobin: 7.5 g/dL — ABNORMAL LOW (ref 12.0–15.0)
Immature Granulocytes: 0 %
Lymphocytes Relative: 13 %
Lymphs Abs: 0.9 10*3/uL (ref 0.7–4.0)
MCH: 31.8 pg (ref 26.0–34.0)
MCHC: 29.4 g/dL — ABNORMAL LOW (ref 30.0–36.0)
MCV: 108.1 fL — ABNORMAL HIGH (ref 80.0–100.0)
Monocytes Absolute: 0.7 10*3/uL (ref 0.1–1.0)
Monocytes Relative: 10 %
Neutro Abs: 4.6 10*3/uL (ref 1.7–7.7)
Neutrophils Relative %: 67 %
Platelets: 192 10*3/uL (ref 150–400)
RBC: 2.36 MIL/uL — ABNORMAL LOW (ref 3.87–5.11)
RDW: 16 % — ABNORMAL HIGH (ref 11.5–15.5)
WBC: 6.9 10*3/uL (ref 4.0–10.5)
nRBC: 0 % (ref 0.0–0.2)

## 2020-09-11 LAB — HEMOGLOBIN AND HEMATOCRIT, BLOOD
HCT: 26.2 % — ABNORMAL LOW (ref 36.0–46.0)
Hemoglobin: 7.4 g/dL — ABNORMAL LOW (ref 12.0–15.0)

## 2020-09-11 LAB — FOLATE: Folate: 10.4 ng/mL (ref >5.9)

## 2020-09-11 LAB — VITAMIN B12: Vitamin B-12: 96 pg/mL — ABNORMAL LOW (ref 180–914)

## 2020-09-11 LAB — RETICULOCYTES
Immature Retic Fract: 22.9 % — ABNORMAL HIGH (ref 2.3–15.9)
RBC.: 2.24 MIL/uL — ABNORMAL LOW (ref 3.87–5.11)
Retic Count, Absolute: 86.2 10*3/uL (ref 19.0–186.0)
Retic Ct Pct: 3.9 % — ABNORMAL HIGH (ref 0.4–3.1)

## 2020-09-11 LAB — FERRITIN: Ferritin: 7 ng/mL — ABNORMAL LOW (ref 11–307)

## 2020-09-11 LAB — GLUCOSE, CAPILLARY: Glucose-Capillary: 102 mg/dL — ABNORMAL HIGH (ref 70–99)

## 2020-09-11 LAB — SARS CORONAVIRUS 2 (TAT 6-24 HRS): SARS Coronavirus 2: NEGATIVE

## 2020-09-11 LAB — CBG MONITORING, ED: Glucose-Capillary: 116 mg/dL — ABNORMAL HIGH (ref 70–99)

## 2020-09-11 LAB — POC OCCULT BLOOD, ED: Fecal Occult Bld: NEGATIVE

## 2020-09-11 MED ORDER — PANTOPRAZOLE SODIUM 40 MG IV SOLR
40.0000 mg | Freq: Once | INTRAVENOUS | Status: AC
  Administered 2020-09-11: 40 mg via INTRAVENOUS
  Filled 2020-09-11: qty 40

## 2020-09-11 MED ORDER — HYDROCODONE-ACETAMINOPHEN 5-325 MG PO TABS
1.0000 | ORAL_TABLET | ORAL | Status: DC | PRN
  Administered 2020-09-11 – 2020-09-12 (×2): 1 via ORAL
  Filled 2020-09-11 (×2): qty 1

## 2020-09-11 MED ORDER — ONDANSETRON HCL 4 MG/2ML IJ SOLN: 4.0000 mg | Freq: Four times a day (QID) | INTRAMUSCULAR | Status: DC | PRN

## 2020-09-11 MED ORDER — TRIAMCINOLONE 0.1 % CREAM:EUCERIN CREAM 1:1
TOPICAL_CREAM | Freq: Two times a day (BID) | CUTANEOUS | Status: DC | PRN
  Filled 2020-09-11: qty 1

## 2020-09-11 MED ORDER — ACETAMINOPHEN 325 MG PO TABS
650.0000 mg | ORAL_TABLET | Freq: Four times a day (QID) | ORAL | Status: DC | PRN
  Administered 2020-09-12: 650 mg via ORAL
  Filled 2020-09-11: qty 2

## 2020-09-11 MED ORDER — ONDANSETRON HCL 4 MG PO TABS: 4.0000 mg | ORAL_TABLET | Freq: Four times a day (QID) | ORAL | Status: DC | PRN

## 2020-09-11 MED ORDER — ALBUTEROL SULFATE (2.5 MG/3ML) 0.083% IN NEBU: 3.0000 mL | INHALATION_SOLUTION | Freq: Four times a day (QID) | RESPIRATORY_TRACT | Status: DC | PRN

## 2020-09-11 MED ORDER — INSULIN ASPART 100 UNIT/ML IJ SOLN
0.0000 [IU] | Freq: Three times a day (TID) | INTRAMUSCULAR | Status: DC
Start: 2020-09-11 — End: 2020-09-13

## 2020-09-11 MED ORDER — RIVAROXABAN 10 MG PO TABS
10.0000 mg | ORAL_TABLET | Freq: Every day | ORAL | Status: DC
  Filled 2020-09-11 (×2): qty 1

## 2020-09-11 MED ORDER — SODIUM CHLORIDE 0.9% FLUSH
3.0000 mL | Freq: Two times a day (BID) | INTRAVENOUS | Status: DC
  Administered 2020-09-11 – 2020-09-15 (×9): 3 mL via INTRAVENOUS

## 2020-09-11 MED ORDER — HYDRALAZINE HCL 20 MG/ML IJ SOLN: 5.0000 mg | INTRAMUSCULAR | Status: DC | PRN

## 2020-09-11 MED ORDER — METOPROLOL SUCCINATE ER 50 MG PO TB24
50.0000 mg | ORAL_TABLET | Freq: Every day | ORAL | Status: DC
  Administered 2020-09-11: 50 mg via ORAL
  Filled 2020-09-11: qty 1
  Filled 2020-09-11: qty 2

## 2020-09-11 MED ORDER — ACETAMINOPHEN 650 MG RE SUPP: 650.0000 mg | Freq: Four times a day (QID) | RECTAL | Status: DC | PRN

## 2020-09-11 MED ORDER — MORPHINE SULFATE (PF) 2 MG/ML IV SOLN: 2.0000 mg | INTRAVENOUS | Status: DC | PRN

## 2020-09-11 NOTE — ED Notes (Signed)
Remains stable, watching TV.

## 2020-09-11 NOTE — ED Notes (Signed)
Pt belongings placed in red contamination bag with pt labels and placed in room.
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
## 2020-09-11 NOTE — ED Notes (Signed)
Sitting up in bed eating breakfast. Talkative. No distress.

## 2020-09-11 NOTE — ED Provider Notes (Signed)
  Emergency Medicine Provider in Triage Note   MSE was initiated and I personally evaluated the patient  12:05 AM on September 11, 2020 as provider in triage.   Chief Complaint: Rash & diarrhea  HPI  Patient is a 70 y.o. who presets to the ED with complaints of rash for the past few weeks to months- diffuse to entire body, itchy, she is unsure of trigger possibly being food, but also notes bedbugs in her apartment. She also notes diarrhea over the past few weeks as well, no blood present.. Currently only taking metoprolol- ran out of this a few weeks ago and needs a refill.    Review of Systems  Positive: Rash, diarrhea, Negative: Abdominal pain, vomiting, dyspnea, throat closing sensation.   Physical Exam  BP (!) 141/64 (BP Location: Left Arm)   Pulse 82   Temp 98.2 F (36.8 C) (Oral)   Resp 16   SpO2 94%    Gen:   Awake, no distress   HEENT:  Atraumatic  Resp:  Normal effort  Cardiac:  Normal rate  Abd:   Nondistended, nontender  MSK:   Moves extremities without difficulty  Neuro:  Speech clear  Skin:   Diffuse rash to trunk and extremities, some excoriations present.   Medical Decision Making   Initiation of care has begun. The patient has been counseled on the process, plan, and necessity for staying for the completion/evaluation, informed that the remainder of the evaluation will be completed by another provider, this initial triage assessment does not replace that evaluation, and the importance of remaining in the ED until their evaluation is complete.   Clinical Impression  Rash        Cherly Anderson, PA-C 09/11/20 0012    Shon Baton, MD 09/11/20 0111
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
## 2020-09-12 DIAGNOSIS — I5043 Acute on chronic combined systolic (congestive) and diastolic (congestive) heart failure: Secondary | ICD-10-CM | POA: Diagnosis not present

## 2020-09-12 DIAGNOSIS — D531 Other megaloblastic anemias, not elsewhere classified: Secondary | ICD-10-CM | POA: Diagnosis not present

## 2020-09-12 DIAGNOSIS — Z9114 Patient's other noncompliance with medication regimen: Secondary | ICD-10-CM | POA: Diagnosis not present

## 2020-09-12 DIAGNOSIS — R2689 Other abnormalities of gait and mobility: Secondary | ICD-10-CM | POA: Diagnosis not present

## 2020-09-12 DIAGNOSIS — I110 Hypertensive heart disease with heart failure: Secondary | ICD-10-CM | POA: Diagnosis present

## 2020-09-12 DIAGNOSIS — D649 Anemia, unspecified: Secondary | ICD-10-CM | POA: Diagnosis present

## 2020-09-12 DIAGNOSIS — Z79899 Other long term (current) drug therapy: Secondary | ICD-10-CM | POA: Diagnosis not present

## 2020-09-12 DIAGNOSIS — E089 Diabetes mellitus due to underlying condition without complications: Secondary | ICD-10-CM | POA: Diagnosis not present

## 2020-09-12 DIAGNOSIS — Z888 Allergy status to other drugs, medicaments and biological substances status: Secondary | ICD-10-CM | POA: Diagnosis not present

## 2020-09-12 DIAGNOSIS — Z7901 Long term (current) use of anticoagulants: Secondary | ICD-10-CM | POA: Diagnosis not present

## 2020-09-12 DIAGNOSIS — Z833 Family history of diabetes mellitus: Secondary | ICD-10-CM | POA: Diagnosis not present

## 2020-09-12 DIAGNOSIS — R000 Tachycardia, unspecified: Secondary | ICD-10-CM | POA: Diagnosis not present

## 2020-09-12 DIAGNOSIS — I5022 Chronic systolic (congestive) heart failure: Secondary | ICD-10-CM | POA: Diagnosis present

## 2020-09-12 DIAGNOSIS — Z609 Problem related to social environment, unspecified: Secondary | ICD-10-CM | POA: Diagnosis present

## 2020-09-12 DIAGNOSIS — Z659 Problem related to unspecified psychosocial circumstances: Secondary | ICD-10-CM | POA: Diagnosis not present

## 2020-09-12 DIAGNOSIS — Z20822 Contact with and (suspected) exposure to covid-19: Secondary | ICD-10-CM | POA: Diagnosis present

## 2020-09-12 DIAGNOSIS — R197 Diarrhea, unspecified: Secondary | ICD-10-CM | POA: Diagnosis present

## 2020-09-12 DIAGNOSIS — F0390 Unspecified dementia without behavioral disturbance: Secondary | ICD-10-CM | POA: Diagnosis present

## 2020-09-12 DIAGNOSIS — Z91018 Allergy to other foods: Secondary | ICD-10-CM | POA: Diagnosis not present

## 2020-09-12 DIAGNOSIS — I482 Chronic atrial fibrillation, unspecified: Secondary | ICD-10-CM | POA: Diagnosis not present

## 2020-09-12 DIAGNOSIS — I2510 Atherosclerotic heart disease of native coronary artery without angina pectoris: Secondary | ICD-10-CM | POA: Diagnosis present

## 2020-09-12 DIAGNOSIS — B888 Other specified infestations: Secondary | ICD-10-CM | POA: Diagnosis present

## 2020-09-12 DIAGNOSIS — I4811 Longstanding persistent atrial fibrillation: Secondary | ICD-10-CM | POA: Diagnosis present

## 2020-09-12 DIAGNOSIS — R0602 Shortness of breath: Secondary | ICD-10-CM | POA: Diagnosis present

## 2020-09-12 DIAGNOSIS — L309 Dermatitis, unspecified: Secondary | ICD-10-CM | POA: Diagnosis present

## 2020-09-12 DIAGNOSIS — Z880 Allergy status to penicillin: Secondary | ICD-10-CM | POA: Diagnosis not present

## 2020-09-12 DIAGNOSIS — F4320 Adjustment disorder, unspecified: Secondary | ICD-10-CM | POA: Diagnosis not present

## 2020-09-12 DIAGNOSIS — Z9119 Patient's noncompliance with other medical treatment and regimen: Secondary | ICD-10-CM | POA: Diagnosis not present

## 2020-09-12 DIAGNOSIS — E669 Obesity, unspecified: Secondary | ICD-10-CM | POA: Diagnosis present

## 2020-09-12 DIAGNOSIS — Z8616 Personal history of COVID-19: Secondary | ICD-10-CM | POA: Diagnosis not present

## 2020-09-12 DIAGNOSIS — E119 Type 2 diabetes mellitus without complications: Secondary | ICD-10-CM | POA: Diagnosis present

## 2020-09-12 DIAGNOSIS — Z6834 Body mass index (BMI) 34.0-34.9, adult: Secondary | ICD-10-CM | POA: Diagnosis not present

## 2020-09-12 DIAGNOSIS — I252 Old myocardial infarction: Secondary | ICD-10-CM | POA: Diagnosis not present

## 2020-09-12 DIAGNOSIS — L539 Erythematous condition, unspecified: Secondary | ICD-10-CM | POA: Diagnosis not present

## 2020-09-12 LAB — CBC
HCT: 23.1 % — ABNORMAL LOW (ref 36.0–46.0)
HCT: 21.9 % — ABNORMAL LOW (ref 36.0–46.0)
Hemoglobin: 6.9 g/dL — CL (ref 12.0–15.0)
Hemoglobin: 6.5 g/dL — CL (ref 12.0–15.0)
MCH: 31.7 pg (ref 26.0–34.0)
MCH: 31.6 pg (ref 26.0–34.0)
MCHC: 29.9 g/dL — ABNORMAL LOW (ref 30.0–36.0)
MCHC: 29.7 g/dL — ABNORMAL LOW (ref 30.0–36.0)
MCV: 106 fL — ABNORMAL HIGH (ref 80.0–100.0)
MCV: 106.3 fL — ABNORMAL HIGH (ref 80.0–100.0)
Platelets: 210 10*3/uL (ref 150–400)
Platelets: 161 10*3/uL (ref 150–400)
RBC: 2.18 MIL/uL — ABNORMAL LOW (ref 3.87–5.11)
RBC: 2.06 MIL/uL — ABNORMAL LOW (ref 3.87–5.11)
RDW: 16 % — ABNORMAL HIGH (ref 11.5–15.5)
RDW: 15.9 % — ABNORMAL HIGH (ref 11.5–15.5)
WBC: 6.5 10*3/uL (ref 4.0–10.5)
WBC: 4.8 10*3/uL (ref 4.0–10.5)
nRBC: 0 % (ref 0.0–0.2)
nRBC: 0 % (ref 0.0–0.2)

## 2020-09-12 LAB — GLUCOSE, CAPILLARY
Glucose-Capillary: 101 mg/dL — ABNORMAL HIGH (ref 70–99)
Glucose-Capillary: 107 mg/dL — ABNORMAL HIGH (ref 70–99)
Glucose-Capillary: 110 mg/dL — ABNORMAL HIGH (ref 70–99)
Glucose-Capillary: 99 mg/dL (ref 70–99)

## 2020-09-12 LAB — BASIC METABOLIC PANEL
Anion gap: 4 — ABNORMAL LOW (ref 5–15)
BUN: 14 mg/dL (ref 8–23)
CO2: 21 mmol/L — ABNORMAL LOW (ref 22–32)
Calcium: 8.1 mg/dL — ABNORMAL LOW (ref 8.9–10.3)
Chloride: 109 mmol/L (ref 98–111)
Creatinine, Ser: 1.22 mg/dL — ABNORMAL HIGH (ref 0.44–1.00)
GFR, Estimated: 48 mL/min — ABNORMAL LOW (ref >60)
Glucose, Bld: 127 mg/dL — ABNORMAL HIGH (ref 70–99)
Potassium: 4.1 mmol/L (ref 3.5–5.1)
Sodium: 134 mmol/L — ABNORMAL LOW (ref 135–145)

## 2020-09-12 LAB — PREPARE RBC (CROSSMATCH)

## 2020-09-12 MED ORDER — SODIUM CHLORIDE 0.9% IV SOLUTION
Freq: Once | INTRAVENOUS | Status: AC
  Administered 2020-09-12: 10 mL via INTRAVENOUS

## 2020-09-12 MED ORDER — RIVAROXABAN 20 MG PO TABS: 20.0000 mg | ORAL_TABLET | Freq: Every day | ORAL | Status: DC

## 2020-09-12 MED ORDER — SODIUM CHLORIDE 0.9 % IV SOLN: 510.0000 mg | Freq: Once | INTRAVENOUS | Status: DC

## 2020-09-12 MED ORDER — ENSURE MAX PROTEIN PO LIQD
11.0000 [oz_av] | Freq: Two times a day (BID) | ORAL | Status: DC
  Administered 2020-09-12 – 2020-09-15 (×7): 11 [oz_av] via ORAL
  Filled 2020-09-12 (×7): qty 330

## 2020-09-12 MED ORDER — METOPROLOL SUCCINATE ER 25 MG PO TB24
25.0000 mg | ORAL_TABLET | Freq: Every day | ORAL | Status: DC
  Administered 2020-09-12 – 2020-09-15 (×4): 25 mg via ORAL
  Filled 2020-09-12 (×3): qty 1

## 2020-09-12 MED ORDER — SODIUM CHLORIDE 0.9 % IV SOLN
250.0000 mg | Freq: Every day | INTRAVENOUS | Status: AC
  Administered 2020-09-12 – 2020-09-13 (×2): 250 mg via INTRAVENOUS
  Filled 2020-09-12 (×2): qty 20

## 2020-09-12 MED ORDER — ADULT MULTIVITAMIN W/MINERALS CH
1.0000 | ORAL_TABLET | Freq: Every day | ORAL | Status: DC
  Administered 2020-09-12 – 2020-09-13 (×2): 1 via ORAL
  Filled 2020-09-12 (×3): qty 1

## 2020-09-12 NOTE — Progress Notes (Signed)
PROGRESS NOTE   Kaitlyn [name]  IRW:[id number] DOB: 12/04/1950 DOA: 09/10/2020 PCP: Kallie Locks, FNP (Inactive)  Brief Narrative:   70 year old female HFrEF EF 20-25% Permanent A. fib, NSVT supposed to be on Eliquis (taking only once a day not twice a day) CHADS2 score >5 DM TY 2 Remote seizure history COVID-19  Documented medical noncompliance 06/16/2020-has been fired by [facility] heart care-has refused cardiac cath in the past  Presented [facility] 2 episodes of diarrhea-found to have bedbugs Bimodal anemia found-Hemoccult negative  Hospital-Problem based course  Diarrhea Reported history but has not had any since admission No further work-up -Hold loperamide at this time HF R EF EF previously 20 to 25% Patient to be given lower dose of Toprol 25 XL daily Goal being rate control-higher dose was held earlier today given concerns for mild hypotension-discussed with nursing to administer above dosing Patient absolutely refuses to take Xarelto and has bizarre beliefs about this causing "diarrhea" This will need to be readdressed in the outpatient setting She been noncompliant in the past with multiple physician groups and has been fired by [facility] heart care Bimodal anemia Likely nutritional in origin given kinetics showing RDW is elevated Transfuse 1 unit PRBC 4/28, iron studies show saturation ratios are low therefore given IV iron On discharge we will attempt to give her ferrous sulfate twice daily DM TY 2 Again noncompliant with all meds other than her metoprolol Will need outpatient reevaluation of the same Bedbugs, skin rash She will continue triamcinolone 0.1%: Eucerin cream to arms and legs for scabbing and hyperkeratinization of the lateral aspects of the legs Frailty and poor social situation Social work to look in on patient and we will get physical therapy to see for recommendations She absolutely refuses to go anywhere but her apartment-she is pretty much  bedbound at baseline and gets deliveries of her food etc. from [employer] She compensates well she tells me by sharing with her neighbors and by collaborating with them We will have social work in the outpatient setting engaged to look in on her home situation    DVT prophylaxis: Lovenox Code Status: Full presumed Family Communication: None present Disposition:  Status is: Observation  The patient will require care spanning > 2 midnights and should be moved to inpatient because: Hemodynamically unstable, Persistent severe electrolyte disturbances and Unsafe d/c plan  Dispo: The patient is from: Home              Anticipated d/c is to: Probably home with home health depending on what therapy says              Patient currently is not medically stable to d/c.   Difficult to place patient No    Consultants:   None  Procedures:   Antimicrobials:     Subjective:  The patient talks to me for 3 minutes nonstop when I first walked in the room and has extremely pressured speech She is very disheveled but seems oriented to time place person She does not seem to be in distress  Objective: Vitals:   09/12/20 0815 09/12/20 0845 09/12/20 0932 09/12/20 1130  BP: 98/64 95/63 117/74 93/75  Pulse: 87 97 (!) 104 92  Resp:      Temp: 98.2 F (36.8 C) 98.3 F (36.8 C) 98.4 F (36.9 C) 98.4 F (36.9 C)  TempSrc: Oral Oral Oral Oral  SpO2: 99% 99% 98% 99%  Weight:        Intake/Output Summary (Last  24 hours) at 09/12/2020 1529 Last data filed at 09/12/2020 0837 Gross per 24 hour  Intake 13 ml  Output --  Net 13 ml   Filed Weights   09/12/20 0708  Weight: 112.5 kg    Examination:  Extremely anxious and tangential historian EOMI, hypervigilant-hirsute with significant facial hair-her hair at the back of her head is matted S1-S2 A. fib on monitors heart rate in the 100 range Chest clear no added sound rales rhonchi Hyperkeratinization of the sides of both legs with  scab No lower extremity edema Abdomen obese nontender nondistended Psych-pressured speech tangential poor grooming   Data Reviewed: personally reviewed   CBC    Component Value Date/Time   WBC 4.8 09/12/2020 0452   RBC 2.06 (L) 09/12/2020 0452   HGB 6.5 (LL) 09/12/2020 0452   HCT 21.9 (L) 09/12/2020 0452   PLT 161 09/12/2020 0452   MCV 106.3 (H) 09/12/2020 0452   MCH 31.6 09/12/2020 0452   MCHC 29.7 (L) 09/12/2020 0452   RDW 15.9 (H) 09/12/2020 0452   LYMPHSABS 0.9 09/11/2020 0011   MONOABS 0.7 09/11/2020 0011   EOSABS 0.7 (H) 09/11/2020 0011   BASOSABS 0.0 09/11/2020 0011   CMP Latest Ref Rng & Units 09/12/2020 09/11/2020 06/19/2020  Glucose 70 - 99 mg/dL 038(U) 828(M) 034(J)  BUN 8 - 23 mg/dL 14 14 17(H)  Creatinine 0.44 - 1.00 mg/dL 1.50(V) 6.97(X) 4.80(X)  Sodium 135 - 145 mmol/L 134(L) 137 134(L)  Potassium 3.5 - 5.1 mmol/L 4.1 4.1 4.2  Chloride 98 - 111 mmol/L 109 110 106  CO2 22 - 32 mmol/L 21(L) 22 19(L)  Calcium 8.9 - 10.3 mg/dL 8.1(L) 8.2(L) 8.1(L)  Total Protein 6.5 - 8.1 g/dL - 6.8 6.5(V)  Total Bilirubin 0.3 - 1.2 mg/dL - 0.4 1.0  Alkaline Phos 38 - 126 U/L - 54 48  AST 15 - 41 U/L - 14(L) 15  ALT 0 - 44 U/L - 9 10     Radiology Studies: No results found.   Scheduled Meds: . insulin aspart  0-15 Units Subcutaneous TID WC  . [START ON 09/13/2020] metoprolol succinate  25 mg Oral Daily  . sodium chloride flush  3 mL Intravenous Q12H   Continuous Infusions: . ferric gluconate (FERRLECIT/NULECIT) IV 250 mg (09/12/20 1159)     LOS: 0 days   Time spent: 45 minutes  Rhetta Mura, MD [facility] To contact the attending provider between 7A-7P or the covering provider during after hours 7P-7A, please log into the web site [email or web address] and access using universal [place] password for that web site. If you do not have the password, please call the hospital operator.  09/12/2020, 3:29 PM

## 2020-09-12 NOTE — Progress Notes (Addendum)
Initial Nutrition Assessment  DOCUMENTATION CODES:   Obesity unspecified  INTERVENTION:  -Ensure Max po BID, each supplement provides 150 kcal and 30 grams of protein -Magic cup TID with meals, each supplement provides 290 kcal and 9 grams of protein -MVI with minerals daily  NUTRITION DIAGNOSIS:   Predicted suboptimal nutrient intake related to social / environmental circumstances as evidenced by per patient/family report.  GOAL:   Patient will meet greater than or equal to 90% of their needs  MONITOR:   PO intake,Supplement acceptance,Labs,Weight trends,I & O's  REASON FOR ASSESSMENT:   Consult Other (Comment) ("nutritional goals")  ASSESSMENT:   Pt admitted with anemia. Note pt with bed bug infestation. PMH includes medical noncompliance, CHF, Afib, seizures, HTN, arthritis, type 2 DM.  Per MD, pt may have underlying psych condition preventing her from being compliant. Pt noted to live alone which MD feels is unsafe. APS consult placed.   Pt complaining of diarrhea and reported 2 episodes occurring the day before admission, but none has occurred since admit. Pt refusing to go anywhere but her apartment -- where she lives alone -- at discharge. At baseline, pt is essentially bedbound and has all food delivered.   No UOP documented  Medications: SSI TID w/ meals, IV ferric gluconate Labs: Na 134 (L) CBGs 102-107-110  Diet Order:   Diet Order            Diet heart healthy/carb modified Room service appropriate? No; Fluid consistency: Thin  Diet effective now                 EDUCATION NEEDS:   Not appropriate for education at this time  Skin:  Skin Assessment: Reviewed RN Assessment  Last BM:  PTA  Height:   Ht Readings from Last 1 Encounters:  06/16/20 5\' 11"  (1.803 m)    Weight:   Wt Readings from Last 1 Encounters:  09/12/20 112.5 kg    BMI:  Body mass index is 34.59 kg/m.  Estimated Nutritional Needs:   Kcal:  2000-2200  Protein:   100-115 grams  Fluid:  >2L/d    09/14/20, MS, RD, LDN RD pager number and weekend/on-call pager number located in Amion.

## 2020-09-12 NOTE — Hospital Course (Addendum)
BUNs/creatinine 16/1.11-->18/1.31 CO2 20 Albumin 2.9  Hemoglobin 8.0 WBC 7.0 PLT 163

## 2020-09-12 NOTE — Progress Notes (Addendum)
Hgb 6.5 this AM. PRBC ordered per MD. Rip Harbour to hold Xarelto until eval by Dr. Mahala Menghini.  Ulyses Southward, PharmD, BCIDP, AAHIVP, CPP Infectious Disease Pharmacist 09/12/2020 7:54 AM   Addendum  Rip Harbour to substitute Feraheme with Nulecit per Dr. Mahala Menghini.  Ulyses Southward, PharmD, BCIDP, AAHIVP, CPP Infectious Disease Pharmacist 09/12/2020 8:11 AM

## 2020-09-12 NOTE — Evaluation (Signed)
Physical Therapy Evaluation Patient Details Name: Kaitlyn [name] MRN: [id number] DOB: 1950/12/29 Today's Date: 09/12/2020   History of Present Illness  70 yo female presents to [facility] on 4/27 from home with diarrhea, anemia, skin rash, bed bug infestation. PMH includes aortic insufficiency, OA, HF, HTN, afib, seizures, DM, covid-19 05/2020, NSTEMI.  Clinical Impression   Pt presents with generalized weakness, poor skin integrity with body-wide sores, impaired balance with history of falls, impaired gait, extremely poor safety awareness, and decreased activity tolerance. Pt to benefit from acute PT to address deficits. Pt completely soiled in urine upon PT arrival to room, appears unconcerned about this. Pt overall requiring min assist for all mobility tasks, including short distance gait to reach toilet. Pt with very poor understanding of personal safety, states she wants to d/c home to bed-bug infested apartment where equipment does not fit in apt and she lacks support. PT recommending SNF to address mobility deficits, pt adamantly refuses rehab stating "PT won't make me better". PT to progress mobility as tolerated, and will continue to follow acutely.      Follow Up Recommendations SNF;Supervision/Assistance - 24 hour    Equipment Recommendations  None recommended by PT    Recommendations for Other Services       Precautions / Restrictions Precautions Precautions: Fall Restrictions Weight Bearing Restrictions: No      Mobility  Bed Mobility Overal bed mobility: Needs Assistance Bed Mobility: Supine to Sit     Supine to sit: Min guard;HOB elevated     General bed mobility comments: HOB elevated, increased time and use of bedrails to perform.    Transfers Overall transfer level: Needs assistance Equipment used: Rolling walker (2 wheeled) Transfers: Sit to/from [employer] Sit to Stand: Min assist Stand pivot transfers: Min assist       General transfer  comment: min assist for power up, steadying, and pivot to [facility]. STS x3, from EOB, BSC, and toilet; and stand pivot x2.  Ambulation/Gait Ambulation/Gait assistance: Min assist Gait Distance (Feet): 10 Feet Assistive device: Rolling walker (2 wheeled) Gait Pattern/deviations: Step-through pattern;Decreased stride length;Trunk flexed Gait velocity: decr   General Gait Details: min assist to steady and correcting LE buckling x1, verbal cuing for upright posture and close chair follow with BSC in case of buckling episode.  Stairs            Wheelchair Mobility    Modified Rankin (Stroke Patients Only)       Balance Overall balance assessment: Needs assistance;History of Falls Sitting-balance support: No upper extremity supported;Feet supported Sitting balance-Leahy Scale: Fair Sitting balance - Comments: able to sit EOB without PT assist   Standing balance support: Bilateral upper extremity supported;During functional activity Standing balance-Leahy Scale: Poor Standing balance comment: reliant on external assist                             Pertinent Vitals/Pain Pain Assessment: No/denies pain    Home Living Family/patient expects to be discharged to:: Private residence Living Arrangements: Alone Available Help at Discharge: Friend(s);Available PRN/intermittently Type of Home: Apartment Home Access: Level entry     Home Layout: One level Home Equipment: Walker - 2 wheels;Wheelchair - [employer];Other (comment) Additional Comments: has a rolling office chair she uses to get around apt with    Prior Function Level of Independence: Independent with assistive device(s);Needs assistance   Gait / Transfers Assistance Needed: pt reports using RW for short distances, mostly transfer  level with use of RW and rolling office chair  ADL's / Homemaking Assistance Needed: pt reports wearing depends, states she walks to the bathroom with her RW or uses her  office chair to reach bathroom. Pt gets groceries delivered, has a barter system with neighbors where they trade food for toilet paper. Pt sponge bathes.        Hand Dominance   Dominant Hand: Left    Extremity/Trunk Assessment   Upper Extremity Assessment Upper Extremity Assessment: Generalized weakness    Lower Extremity Assessment Lower Extremity Assessment: Generalized weakness    Cervical / Trunk Assessment Cervical / Trunk Assessment: Kyphotic  Communication   Communication: No difficulties  Cognition Arousal/Alertness: Awake/alert Behavior During Therapy: WFL for tasks assessed/performed Overall Cognitive Status: No family/caregiver present to determine baseline cognitive functioning                                 General Comments: Pt is tangential during session, difficult to keep on task. Pt fixates on certain things, i.e. having more toilet paper when she has 3 rolls in front of her. Unsure of pt mental health history.      General Comments General comments (skin integrity, edema, etc.): bleeding and scaling rashes on pt's LEs and back, pt picking at rashes throughout session.    Exercises     Assessment/Plan    PT Assessment Patient needs continued PT services  PT Problem List Decreased strength;Decreased mobility;Decreased safety awareness;Decreased activity tolerance;Decreased balance;Decreased knowledge of use of DME;Decreased cognition;Decreased skin integrity;Decreased knowledge of precautions;Obesity       PT Treatment Interventions DME instruction;Therapeutic activities;Gait training;Therapeutic exercise;Patient/family education;Balance training;Functional mobility training;Neuromuscular re-education    PT Goals (Current goals can be found in the Care Plan section)  Acute Rehab PT Goals Patient Stated Goal: go back home PT Goal Formulation: With patient Time For Goal Achievement: 09/26/20 Potential to Achieve Goals: Fair     Frequency Min 3X/week   Barriers to discharge        Co-evaluation               AM-PAC PT "6 Clicks" Mobility  Outcome Measure Help needed turning from your back to your side while in a flat bed without using bedrails?: A Little Help needed moving from lying on your back to sitting on the side of a flat bed without using bedrails?: A Little Help needed moving to and from a bed to a chair (including a wheelchair)?: A Little Help needed standing up from a chair using your arms (e.g., wheelchair or bedside chair)?: A Little Help needed to walk in hospital room?: A Little Help needed climbing 3-5 steps with a railing? : A Lot 6 Click Score: 17    End of Session   Activity Tolerance: Patient tolerated treatment well;Patient limited by fatigue Patient left: in chair;with call bell/phone within reach;with chair alarm set Nurse Communication: Mobility status PT Visit Diagnosis: Other abnormalities of gait and mobility (R26.89);Difficulty in walking, not elsewhere classified (R26.2)    Time: 1540-1600 PT Time Calculation (min) (ACUTE ONLY): 20 min   Charges:   PT Evaluation $PT Eval Low Complexity: 1 Low         Bailei Buist S, PT DPT Acute Rehabilitation Services Pager [phone]  Office [phone]   Tyrone Apple E Christain [place] 09/12/2020, 5:03 PM

## 2020-09-12 NOTE — Progress Notes (Signed)
TR[facility] night shift.  The nursing staff reports of the patient's hemoglobin level has decreased to 6.5  from 6.9 g/dL yesterday.  She has an active type and screen drawn yesterday.  A 1 unit PRBC transfusion has been ordered.  Sanda Klein, MD.

## 2020-09-12 NOTE — Care Management Obs Status (Signed)
MEDICARE OBSERVATION STATUS NOTIFICATION   Patient Details  Name: Kaitlyn [name] MRN: [id number] Date of Birth: [birth year]   Medicare Observation Status Notification Given:  Yes    Lorri Frederick, LCSW 09/12/2020, 3:38 PM

## 2020-09-13 ENCOUNTER — Encounter (HOSPITAL_COMMUNITY): Payer: Self-pay | Admitting: Family Medicine

## 2020-09-13 LAB — RENAL FUNCTION PANEL
Albumin: 2.9 g/dL — ABNORMAL LOW (ref 3.5–5.0)
Anion gap: 7 (ref 5–15)
BUN: 17 mg/dL (ref 8–23)
CO2: 21 mmol/L — ABNORMAL LOW (ref 22–32)
Calcium: 7.8 mg/dL — ABNORMAL LOW (ref 8.9–10.3)
Chloride: 105 mmol/L (ref 98–111)
Creatinine, Ser: 1.11 mg/dL — ABNORMAL HIGH (ref 0.44–1.00)
GFR, Estimated: 53 mL/min — ABNORMAL LOW (ref >60)
Glucose, Bld: 110 mg/dL — ABNORMAL HIGH (ref 70–99)
Phosphorus: 3.1 mg/dL (ref 2.5–4.6)
Potassium: 4.1 mmol/L (ref 3.5–5.1)
Sodium: 133 mmol/L — ABNORMAL LOW (ref 135–145)

## 2020-09-13 LAB — CBC WITH DIFFERENTIAL/PLATELET
Abs Immature Granulocytes: 0.03 10*3/uL (ref 0.00–0.07)
Basophils Absolute: 0 10*3/uL (ref 0.0–0.1)
Basophils Relative: 1 %
Eosinophils Absolute: 0.6 10*3/uL — ABNORMAL HIGH (ref 0.0–0.5)
Eosinophils Relative: 9 %
HCT: 24 % — ABNORMAL LOW (ref 36.0–46.0)
Hemoglobin: 7.2 g/dL — ABNORMAL LOW (ref 12.0–15.0)
Immature Granulocytes: 1 %
Lymphocytes Relative: 18 %
Lymphs Abs: 1.2 10*3/uL (ref 0.7–4.0)
MCH: 29.9 pg (ref 26.0–34.0)
MCHC: 30 g/dL (ref 30.0–36.0)
MCV: 99.6 fL (ref 80.0–100.0)
Monocytes Absolute: 0.6 10*3/uL (ref 0.1–1.0)
Monocytes Relative: 10 %
Neutro Abs: 4 10*3/uL (ref 1.7–7.7)
Neutrophils Relative %: 61 %
Platelets: 185 10*3/uL (ref 150–400)
RBC: 2.41 MIL/uL — ABNORMAL LOW (ref 3.87–5.11)
RDW: 19.7 % — ABNORMAL HIGH (ref 11.5–15.5)
WBC: 6.4 10*3/uL (ref 4.0–10.5)
nRBC: 0 % (ref 0.0–0.2)

## 2020-09-13 MED ORDER — FERROUS SULFATE 325 (65 FE) MG PO TABS
325.0000 mg | ORAL_TABLET | Freq: Two times a day (BID) | ORAL | Status: DC
  Administered 2020-09-13 – 2020-09-15 (×4): 325 mg via ORAL
  Filled 2020-09-13 (×5): qty 1

## 2020-09-13 MED ORDER — TRIAMCINOLONE 0.1 % CREAM:EUCERIN CREAM 1:1
TOPICAL_CREAM | Freq: Two times a day (BID) | CUTANEOUS | Status: DC
  Administered 2020-09-14: 1 via TOPICAL
  Filled 2020-09-13 (×3): qty 1

## 2020-09-13 MED ORDER — CALCIUM CARBONATE ANTACID 500 MG PO CHEW
1.0000 | CHEWABLE_TABLET | Freq: Three times a day (TID) | ORAL | Status: DC | PRN
  Administered 2020-09-13: 200 mg via ORAL
  Filled 2020-09-13 (×2): qty 1

## 2020-09-13 MED ORDER — ENOXAPARIN SODIUM 40 MG/0.4ML IJ SOSY
40.0000 mg | PREFILLED_SYRINGE | INTRAMUSCULAR | Status: DC
  Filled 2020-09-13 (×2): qty 0.4

## 2020-09-13 NOTE — Progress Notes (Addendum)
PROGRESS NOTE   Kaitlyn [name]  YTK:[id number] DOB: 08/26/1950 DOA: 09/10/2020 PCP: Kallie Locks, FNP (Inactive)  Brief Narrative:   70 year old female HFrEF EF 20-25% Permanent A. fib, NSVT supposed to be on Eliquis (taking only once a day not twice a day) CHADS2 score >5 DM TY 2 Remote seizure history COVID-19  Documented medical noncompliance 06/16/2020-has been fired by [facility] heart care-has refused cardiac cath in the past  Presented [facility] 2 episodes of diarrhea-found to have bedbugs Bimodal anemia found-Hemoccult negative  Hospital-Problem based course  Diarrhea Reported history but has not had any since admission No further work-up -Hold loperamide at this time-will not be giving on discharge--h/o of needing ED disimpaction 2020 HF R EF EF previously 20 to 25%  lower dose of Toprol to 25 XL daily Patient absolutely refuses to take Xarelto and has bizarre beliefs about this causing "diarrhea" Noncompliant previously with heart care [facility] Bimodal anemia Likely nutritional in origin given kinetics showing RDW is elevated Hemoccult was negative on admission trasnfuse 4/28--will consider Transfuse 1 more unit PRBC 4/30 if remains in 7 range iron studies low- given IV iron Start ferrous sulfate twice daily Will need eventual Colonoscopy if she will agree DM TY 2 Again noncompliant with all meds other than her metoprolol Will need outpatient reevaluation of the same Bedbugs, skin rash Twice daily triamcinolone 0.1%: Eucerin cream to arms and legs for scabbing and hyperkeratinization of the lateral aspects of the legs Frailty and poor social situation Social work to look in on patient  Patient has probable underlying dementia given OT assesment social work in the outpatient  to look in on her home situation--may need APSinvovlement    DVT prophylaxis: Lovenox Code Status: Full presumed Family Communication: None present Disposition:  Status is:  Observation  The patient will require care spanning > 2 midnights and should be moved to inpatient because: Hemodynamically unstable, Persistent severe electrolyte disturbances and Unsafe d/c plan  Dispo: The patient is from: Home              Anticipated d/c is to: Probably home with home health depending on what therapy says              Patient currently is not medically stable to d/c.   Difficult to place patient No    Consultants:   None  Procedures:   Antimicrobials:     Subjective:  Tangential  And fixed beliefs noted on discussions No distress No dark stool tarry stool SHe is willing to get the cream to her legs No cp  Objective: Vitals:   09/12/20 0932 09/12/20 1130 09/12/20 2131 09/13/20 0609  BP: 117/74 93/75 101/65 109/67  Pulse: (!) 104 92 92 84  Resp:   16 16  Temp: 98.4 F (36.9 C) 98.4 F (36.9 C) 98.3 F (36.8 C) (!) 97.5 F (36.4 C)  TempSrc: Oral Oral    SpO2: 98% 99% 97% 96%  Weight:        Intake/Output Summary (Last 24 hours) at 09/13/2020 1528 Last data filed at 09/13/2020 1527 Gross per 24 hour  Intake 961.38 ml  Output 250 ml  Net 711.38 ml   Filed Weights   09/12/20 0708  Weight: 112.5 kg    Examination:  anxious and tangential EOMI, hypervigilan S1-S2 A. fib on monitors Chest clear no added sound rales rhonchi Hyperkeratinization of the sides of both legs with scab    Data Reviewed: personally reviewed   CBC  Component Value Date/Time   WBC 6.4 09/13/2020 0124   RBC 2.41 (L) 09/13/2020 0124   HGB 7.2 (L) 09/13/2020 0124   HCT 24.0 (L) 09/13/2020 0124   PLT 185 09/13/2020 0124   MCV 99.6 09/13/2020 0124   MCH 29.9 09/13/2020 0124   MCHC 30.0 09/13/2020 0124   RDW 19.7 (H) 09/13/2020 0124   LYMPHSABS 1.2 09/13/2020 0124   MONOABS 0.6 09/13/2020 0124   EOSABS 0.6 (H) 09/13/2020 0124   BASOSABS 0.0 09/13/2020 0124   CMP Latest Ref Rng & Units 09/13/2020 09/12/2020 09/11/2020  Glucose 70 - 99 mg/dL 170(Y) 174(B)  449(Q)  BUN 8 - 23 mg/dL 17 14 14   Creatinine 0.44 - 1.00 mg/dL ) 7.59(F) 6.38(G)  Sodium 135 - 145 mmol/L 133(L) 134(L) 137  Potassium 3.5 - 5.1 mmol/L 4.1 4.1 4.1  Chloride 98 - 111 mmol/L 105 109 110  CO2 22 - 32 mmol/L 21(L) 21(L) 22  Calcium 8.9 - 10.3 mg/dL 7.8(L) 8.1(L) 8.2(L)  Total Protein 6.5 - 8.1 g/dL - - 6.8  Total Bilirubin 0.3 - 1.2 mg/dL - - 0.4  Alkaline Phos 38 - 126 U/L - - 54  AST 15 - 41 U/L - - 14(L)  ALT 0 - 44 U/L - - 9     Radiology Studies: No results found.   Scheduled Meds: . enoxaparin (LOVENOX) injection  40 mg Subcutaneous Q24H  . metoprolol succinate  25 mg Oral Daily  . multivitamin with minerals  1 tablet Oral Daily  . Ensure Max Protein  11 oz Oral BID  . sodium chloride flush  3 mL Intravenous Q12H  . triamcinolone 0.1 % cream : eucerin   Topical BID   Continuous Infusions:    LOS: 1 day   Time spent: 45 minutes  6.65(L, MD [facility] To contact the attending provider between 7A-7P or the covering provider during after hours 7P-7A, please log into the web site [email or web address] and access using universal Cash password for that web site. If you do not have the password, please call the hospital operator.  09/13/2020, 3:28 PM

## 2020-09-13 NOTE — Evaluation (Addendum)
Occupational Therapy Evaluation Patient Details Name: Kaitlyn [name] MRN: [id number] DOB: 09-13-1950 Today's Date: 09/13/2020    History of Present Illness 70 yo female presents to [facility] on 4/27 from home with diarrhea, anemia, skin rash, bed bug infestation. PMH includes aortic insufficiency, OA, HF, HTN, afib, seizures, DM, covid-19 05/2020, NSTEMI.   Clinical Impression   Pt admitted with above. She demonstrates the below listed deficits and will benefit from continued OT to maximize safety and independence with BADLs. Pt presents to OT with generalized weakness, impaired balance, cognitive impairment including poor safety awareness. The Short Blessed Test was administered with pt scoring 7/28 which is indicative of questionable cognitive deficit.  She currently requires min A for functional transfers with RW  And requires set up assist for LB ADLs and mod A for LB ADLs and peri care/toileting.  She insists she is able to perform these tasks at home, but is unable to demonstrate how she does them, and provides conflicting info re: how she performs them at home.  She reports she lives at home, in an apartment and reports she is mod I with transfers to w/c or rolling office chair, and that she can perform ADLs mod I.  It appears that she has been unable to adequately care for herself, and she currently requires min A for transfers and assist for ADLs, therefore, feel she would benefit from SNF with ultimate transition to LTC or ALF for more support and improved living conditions.  Pt however, is adamantly refusing these options.  If she continues to refuse, recommend loading her with [facility] services HHOT,PT, aide, RN, SW.  Will follow acutely.       Follow Up Recommendations  SNF (Pt admantly refusing)    Equipment Recommendations  None recommended by OT    Recommendations for Other Services       Precautions / Restrictions Precautions Precautions: Fall Precaution Comments: h/o falls       Mobility Bed Mobility Overal bed mobility: Needs Assistance Bed Mobility: Supine to Sit     Supine to sit: Supervision          Transfers Overall transfer level: Needs assistance Equipment used: Rolling walker (2 wheeled) Transfers: Sit to/from [employer] Sit to Stand: Min assist;From elevated surface Stand pivot transfers: Min assist       General transfer comment: Bed level elevated.  Pt requires assist to boost into standing and assist to steady to take 4 steps to recliner    Balance Overall balance assessment: Needs assistance;History of Falls Sitting-balance support: No upper extremity supported;Feet supported Sitting balance-Leahy Scale: Good Sitting balance - Comments: able to lean forward to adjust socks   Standing balance support: Bilateral upper extremity supported;During functional activity Standing balance-Leahy Scale: Poor Standing balance comment: reliant on external assist                           ADL either performed or assessed with clinical judgement   ADL Overall ADL's : Needs assistance/impaired Eating/Feeding: Independent   Grooming: Wash/dry hands;Wash/dry face;Oral care;Brushing hair;Set up;Sitting   Upper Body Bathing: Set up;Sitting   Lower Body Bathing: Moderate assistance;Sit to/from stand   Upper Body Dressing : Set up;Sitting   Lower Body Dressing: Moderate assistance;Sit to/from stand   Toilet Transfer: Minimal assistance;Stand-pivot;BSC;RW Statistician Details (indicate cue type and reason): min A to steady       Tub/Shower Transfer Details (indicate cue type and reason): NA as she  sponge bathes Functional mobility during ADLs: Minimal assistance General ADL Comments: Pt unable to pull pants over hips and unable to release walker to bathe peri area.  She insists she can manage at home, but is unable to demonstrate that this date     Vision Baseline Vision/History: Wears glasses Wears  Glasses: At all times Additional Comments: Pt reports she her glasses prescription is outdated     Perception     Praxis      Pertinent Vitals/Pain Pain Assessment: No/denies pain     Hand Dominance Left   Extremity/Trunk Assessment Upper Extremity Assessment Upper Extremity Assessment: Generalized weakness   Lower Extremity Assessment Lower Extremity Assessment: Generalized weakness   Cervical / Trunk Assessment Cervical / Trunk Assessment: Kyphotic   Communication Communication Communication: Other (comment) (speech is rapid and forced.)   Cognition Arousal/Alertness: Awake/alert Behavior During Therapy: WFL for tasks assessed/performed (irritable) Overall Cognitive Status: History of cognitive impairments - at baseline                                 General Comments: Pt with h/o cognitive impairment - chart review indicates she had a SLUMS performed at SNF on 10/26/18, was 14/30 which indicates dementia.  Short Blessed Test was administered this date with pt scoring 7/28 inidcating questionable impairment.  Functionally she was noted to frequently repeat self.  She demonstrates a rigid line of thinking insistent that she eat breakfast (which had not yet arrived), before cleaning herself up as she was lying in a bed saturated with urine.  When asked if she has notified her nurse that she was wet, she stated, "I was waiting to eat my breakfast, and then I was going to let them clean me up afterwards.  Multiple times during attempts to engage her in cleaning herself up, she stated "This is not my priority"..."my priority is to get my breakfast" She is insistent that her sores and skin issues are secondary to eating [employer] butter.   General Comments       Exercises     Shoulder Instructions      Home Living Family/patient expects to be discharged to:: Private residence Living Arrangements: Alone Available Help at Discharge: Friend(s);Available  PRN/intermittently Type of Home: Apartment Home Access: Level entry     Home Layout: One level     Bathroom Shower/Tub: Tub only   Firefighter: Standard     Home Equipment: Environmental consultant - 2 wheels;Wheelchair - [employer];Other (comment)   Additional Comments: has a rolling office chair she uses to get around apt with      Prior Functioning/Environment Level of Independence: Independent with assistive device(s);Needs assistance  Gait / Transfers Assistance Needed: pt reports using RW for short distances, mostly transfer level with use of RW and rolling office chair ADL's / Homemaking Assistance Needed: pt reports wearing depends, states she walks to the bathroom with her RW or uses her office chair to reach bathroom. Pt gets groceries delivered, has a barter system with neighbors where they trade food for toilet paper. Pt sponge bathes, but provides inconsistent info re: how she performs peri care as she states she can't bathe per backside.            OT Problem List: Decreased strength;Decreased activity tolerance;Impaired balance (sitting and/or standing);Decreased cognition;Decreased safety awareness;Decreased knowledge of use of DME or AE;Obesity      OT Treatment/Interventions: Self-care/ADL training;DME and/or AE instruction;Therapeutic activities;Patient/family  education;Balance training;Cognitive remediation/compensation    OT Goals(Current goals can be found in the care plan section) Acute Rehab OT Goals Patient Stated Goal: to go home tomorrow OT Goal Formulation: With patient Time For Goal Achievement: 09/26/20 Potential to Achieve Goals: Good ADL Goals Pt Will Perform Lower Body Bathing: with min guard assist;with adaptive equipment;sit to/from stand Pt Will Perform Lower Body Dressing: with min guard assist;with adaptive equipment;sit to/from stand Pt Will Transfer to Toilet: with supervision;ambulating;regular height toilet;bedside commode;grab bars Pt  Will Perform Toileting - Clothing Manipulation and hygiene: with min guard assist;sit to/from stand;with adaptive equipment  OT Frequency: Min 2X/week   Barriers to D/C: Decreased caregiver support          Co-evaluation              AM-PAC OT "6 Clicks" Daily Activity     Outcome Measure Help from another person eating meals?: None Help from another person taking care of personal grooming?: A Little Help from another person toileting, which includes using toliet, bedpan, or urinal?: A Lot Help from another person bathing (including washing, rinsing, drying)?: A Lot Help from another person to put on and taking off regular upper body clothing?: A Little Help from another person to put on and taking off regular lower body clothing?: A Lot 6 Click Score: 16   End of Session Equipment Utilized During Treatment: Gait belt;Rolling walker Nurse Communication: Mobility status  Activity Tolerance: Patient tolerated treatment well Patient left: in bed;with call bell/phone within reach  OT Visit Diagnosis: Unsteadiness on feet (R26.81);Cognitive communication deficit (R41.841)                Time: 6269-4854 OT Time Calculation (min): 28 min Charges:  OT General Charges $OT Visit: 1 Visit OT Evaluation $OT Eval Moderate Complexity: 1 Mod OT Treatments $Self Care/Home Management : 8-22 mins  Eber Jones., OTR/L Acute Rehabilitation Services Pager [phone] Office [phone]   Jeani Hawking M 09/13/2020, 9:30 AM

## 2020-09-13 NOTE — TOC Initial Note (Signed)
Transition of Care [facility]) - Initial/Assessment Note    Patient Details  Name: Kaitlyn [name] MRN: [id number] Date of Birth: Feb 02, 1951  Transition of Care [facility]) CM/SW Contact:    Carley Hammed, LCSWA Phone Number: 09/13/2020, 4:30 PM  Clinical Narrative:                 CSW noted that pt has recommendations for SNF, but according to notes is not agreeable. CSW followed up and learned that pt currently has a massive bed bug infestation and according to a neighbor, apartment is in bad condition. CSW followed up with pt who made it very clear she wanted no services. She explained to CSW about her daily routine, and stated she had help at home. According to the nurse, neighbor did not seem particularly interested in taking care of pt. CSW asked if she could attempt to set up [facility] and she stated "I already told you no". MD stated that he would like for someone to follow up with pt outpatient. CSW attempted to make an APS report, still waiting for a call back. At this time, pt is refusing all services and has the right to make these decisions. CSW will follow until pt DC's incase needs arise or she changes her mind..   Expected Discharge Plan: Home/Self Care Barriers to Discharge: Continued Medical Work up   Patient Goals and CMS Choice     Choice offered to / list presented to : Patient  Expected Discharge Plan and Services Expected Discharge Plan: Home/Self Care In-house Referral: Clinical Social Work Discharge Planning Services: CM Consult Post Acute Care Choice: NA Living arrangements for the past 2 months: Apartment                                      Prior Living Arrangements/Services Living arrangements for the past 2 months: Apartment Lives with:: Self Patient language and need for interpreter reviewed:: Yes Do you feel safe going back to the place where you live?: Yes      Need for Family Participation in Patient Care: Yes (Comment) Care giver support system in  place?: No (comment)   Criminal Activity/Legal Involvement Pertinent to Current Situation/Hospitalization: No - Comment as needed  Activities of Daily Living Home Assistive Devices/Equipment: None ADL Screening (condition at time of admission) Patient's cognitive ability adequate to safely complete daily activities?: Yes Is the patient deaf or have difficulty hearing?: No Does the patient have difficulty seeing, even when wearing glasses/contacts?: No Does the patient have difficulty concentrating, remembering, or making decisions?: No Patient able to express need for assistance with ADLs?: Yes Does the patient have difficulty dressing or bathing?: No Independently performs ADLs?: Yes (appropriate for developmental age) Does the patient have difficulty walking or climbing stairs?: Yes Weakness of Legs: Both Weakness of Arms/Hands: None  Permission Sought/Granted   Permission granted to share information with : No              Emotional Assessment Appearance:: Appears stated age Attitude/Demeanor/Rapport: Guarded Affect (typically observed): Irritable Orientation: : Oriented to Self,Oriented to Place,Oriented to  Time,Oriented to Situation Alcohol / Substance Use: Not Applicable Psych Involvement: No (comment)  Admission diagnosis:  Diarrhea [R19.7] Rash [R21] Anemia [D64.9] Longstanding persistent atrial fibrillation (HCC) [I48.11] Anemia, unspecified type [D64.9] Eczema, unspecified type [L30.9] Patient Active Problem List   Diagnosis Date Noted  . Diarrhea 09/12/2020  . Infestation by bed bug  09/11/2020  . COVID-19 virus infection 06/16/2020  . SOB (shortness of breath) 06/16/2020  . Medication nonadherence due to psychosocial problem 04/18/2019  . Persistent atrial fibrillation (HCC) 04/17/2019  . Positive occult stool blood test 01/23/2019  . Advance care planning 01/04/2019  . Chronic systolic CHF (congestive heart failure) (HCC)   . Vitamin B 12 deficiency    . Neurocognitive deficits 11/01/2018  . Chronic diarrhea 10/16/2018  . Thrombocytopenia (HCC) 10/16/2018  . CAD (coronary artery disease) 10/16/2018  . Acute respiratory failure with hypoxia (HCC) 10/16/2018  . Generalized weakness 10/16/2018  . Anemia 11/23/2017  . NSTEMI (non-ST elevated myocardial infarction) (HCC) 01/09/2017  . Ischemic cardiomyopathy 01/09/2017  . Obesity, Class III, BMI 40-49.9 (morbid obesity) (HCC) 01/09/2017  . Chest pain   . Atrial fibrillation with RVR (HCC) 01/07/2017  . Anaphylaxis 12/04/2015  . Hypotension due to hypovolemia 12/01/2015  . Leukocytosis   . Type 2 diabetes mellitus with hyperglycemia, with long-term current use of insulin (HCC)   . Lactic acidosis 07/07/2015  . Dehydration 07/07/2015  . Angioedema 07/07/2015  . Allergic reaction 07/07/2015  . Acute on chronic combined systolic and diastolic heart failure (HCC) 03/24/2015  . Atrial fibrillation with rapid ventricular response (HCC)   . Diabetes mellitus, without long-term current use of insulin (HCC)   . Thyroid nodule   . Seizures (HCC)   . Hypokalemia   . Hypomagnesemia   . Lymphadenopathy 09/24/2014  . Macrocytosis   . Essential hypertension   . Psychosocial impairment   . Acute on chronic systolic CHF (congestive heart failure) (HCC) 04/27/2014  . H/O noncompliance with medical treatment, presenting hazards to health 03/23/2014  . Chronic atrial fibrillation 03/23/2014   PCP:  Kallie Locks, FNP (Inactive) Pharmacy:   [facility] - [place], [place] - [address] [place] [place] [address] Phone: [phone] Fax: [phone]  [facility] [address] [place] [address] Phone: [phone] Fax: [phone]     Social Determinants of Health (SDOH) Interventions    Readmission Risk Interventions Readmission Risk Prevention Plan 10/17/2018  Transportation Screening Complete  PCP or Specialist  Appt within 3-5 Days Not Complete  Not Complete comments plan to d/c to SNF  HRI or Home Care Consult Complete  Social Work Consult for Recovery Care Planning/Counseling Complete  Palliative Care Screening Not Applicable  Medication Review Oceanographer) Complete  Some recent data might be hidden

## 2020-09-14 ENCOUNTER — Encounter (HOSPITAL_COMMUNITY): Payer: Self-pay | Admitting: Family Medicine

## 2020-09-14 DIAGNOSIS — F4320 Adjustment disorder, unspecified: Secondary | ICD-10-CM

## 2020-09-14 DIAGNOSIS — Z659 Problem related to unspecified psychosocial circumstances: Secondary | ICD-10-CM

## 2020-09-14 LAB — BPAM RBC
Blood Product Expiration Date: 202205182359
Blood Product Expiration Date: 202205252359
ISSUE DATE / TIME: 202204290756
Unit Type and Rh: 6200
Unit Type and Rh: 6200

## 2020-09-14 LAB — CBC WITH DIFFERENTIAL/PLATELET
Abs Immature Granulocytes: 0.05 10*3/uL (ref 0.00–0.07)
Basophils Absolute: 0.1 10*3/uL (ref 0.0–0.1)
Basophils Relative: 1 %
Eosinophils Absolute: 0.6 10*3/uL — ABNORMAL HIGH (ref 0.0–0.5)
Eosinophils Relative: 9 %
HCT: 23.7 % — ABNORMAL LOW (ref 36.0–46.0)
Hemoglobin: 7.1 g/dL — ABNORMAL LOW (ref 12.0–15.0)
Immature Granulocytes: 1 %
Lymphocytes Relative: 15 %
Lymphs Abs: 1 10*3/uL (ref 0.7–4.0)
MCH: 30.3 pg (ref 26.0–34.0)
MCHC: 30 g/dL (ref 30.0–36.0)
MCV: 101.3 fL — ABNORMAL HIGH (ref 80.0–100.0)
Monocytes Absolute: 0.7 10*3/uL (ref 0.1–1.0)
Monocytes Relative: 10 %
Neutro Abs: 4.1 10*3/uL (ref 1.7–7.7)
Neutrophils Relative %: 64 %
Platelets: 166 10*3/uL (ref 150–400)
RBC: 2.34 MIL/uL — ABNORMAL LOW (ref 3.87–5.11)
RDW: 19.5 % — ABNORMAL HIGH (ref 11.5–15.5)
WBC: 6.4 10*3/uL (ref 4.0–10.5)
nRBC: 0.3 % — ABNORMAL HIGH (ref 0.0–0.2)

## 2020-09-14 LAB — TYPE AND SCREEN
ABO/RH(D): A POS
Antibody Screen: NEGATIVE
Unit division: 0
Unit division: 0

## 2020-09-14 LAB — RENAL FUNCTION PANEL
Albumin: 2.9 g/dL — ABNORMAL LOW (ref 3.5–5.0)
Anion gap: 7 (ref 5–15)
BUN: 16 mg/dL (ref 8–23)
CO2: 22 mmol/L (ref 22–32)
Calcium: 8.1 mg/dL — ABNORMAL LOW (ref 8.9–10.3)
Chloride: 106 mmol/L (ref 98–111)
Creatinine, Ser: 1.11 mg/dL — ABNORMAL HIGH (ref 0.44–1.00)
GFR, Estimated: 53 mL/min — ABNORMAL LOW (ref >60)
Glucose, Bld: 93 mg/dL (ref 70–99)
Phosphorus: 3.1 mg/dL (ref 2.5–4.6)
Potassium: 4 mmol/L (ref 3.5–5.1)
Sodium: 135 mmol/L (ref 135–145)

## 2020-09-14 LAB — PREPARE RBC (CROSSMATCH)

## 2020-09-14 MED ORDER — FUROSEMIDE 10 MG/ML IJ SOLN
20.0000 mg | Freq: Once | INTRAMUSCULAR | Status: DC
  Filled 2020-09-14: qty 2

## 2020-09-14 MED ORDER — DIPHENHYDRAMINE HCL 25 MG PO CAPS
25.0000 mg | ORAL_CAPSULE | Freq: Once | ORAL | Status: AC
  Administered 2020-09-14: 25 mg via ORAL
  Filled 2020-09-14: qty 1

## 2020-09-14 MED ORDER — SODIUM CHLORIDE 0.9% IV SOLUTION: Freq: Once | INTRAVENOUS | Status: AC

## 2020-09-14 MED ORDER — ACETAMINOPHEN 325 MG PO TABS
650.0000 mg | ORAL_TABLET | Freq: Once | ORAL | Status: AC
  Administered 2020-09-14: 650 mg via ORAL
  Filled 2020-09-14: qty 2

## 2020-09-14 NOTE — TOC Progression Note (Signed)
Transition of Care [facility]) - Progression Note    Patient Details  Name: Kaitlyn [name] MRN: [id number] Date of Birth: 08-04-1950  Transition of Care [facility]) CM/SW Contact  Carley Hammed, [place] Phone Number: 09/14/2020, 11:17 AM  Clinical Narrative:    APS report made to [facility] due to concerns of medical team, of pt self neglecting. APS worker noted it would likely be screened in and they would follow up when she discharges. SW will follow for any further needs.   Expected Discharge Plan: Home/Self Care Barriers to Discharge: Continued Medical Work up  Expected Discharge Plan and Services Expected Discharge Plan: Home/Self Care In-house Referral: Clinical Social Work Discharge Planning Services: CM Consult Post Acute Care Choice: NA Living arrangements for the past 2 months: Apartment                                       Social Determinants of Health (SDOH) Interventions    Readmission Risk Interventions Readmission Risk Prevention Plan 10/17/2018  Transportation Screening Complete  PCP or Specialist Appt within 3-5 Days Not Complete  Not Complete comments plan to d/c to SNF  HRI or Home Care Consult Complete  Social Work Consult for Recovery Care Planning/Counseling Complete  Palliative Care Screening Not Applicable  Medication Review Oceanographer) Complete  Some recent data might be hidden

## 2020-09-14 NOTE — Plan of Care (Signed)
°  Problem: Education: °Goal: Knowledge of General Education information will improve °Description: Including pain rating scale, medication(s)/side effects and non-pharmacologic comfort measures °Outcome: Not Progressing °  °Problem: Health Behavior/Discharge Planning: °Goal: Ability to manage health-related needs will improve °Outcome: Not Progressing °  °Problem: Clinical Measurements: °Goal: Ability to maintain clinical measurements within normal limits will improve °Outcome: Not Progressing °Goal: Will remain free from infection °Outcome: Not Progressing °Goal: Diagnostic test results will improve °Outcome: Not Progressing °Goal: Respiratory complications will improve °Outcome: Not Progressing °Goal: Cardiovascular complication will be avoided °Outcome: Not Progressing °  °Problem: Nutrition: °Goal: Adequate nutrition will be maintained °Outcome: Not Progressing °  °Problem: Coping: °Goal: Level of anxiety will decrease °Outcome: Not Progressing °  °

## 2020-09-14 NOTE — Progress Notes (Signed)
PROGRESS NOTE   Kaitlyn [name]  VFI:[id number] DOB: [birth year] DOA: 09/10/2020 PCP: Kallie Locks, FNP (Inactive)  Brief Narrative:   70 year old female HFrEF EF 20-25% Permanent A. fib, NSVT supposed to be on Eliquis (taking only once a day not twice a day) CHADS2 score >5 DM TY 2 Remote seizure history COVID-19  Documented medical noncompliance 06/16/2020-has been fired by [facility] heart care-has refused cardiac cath in the past  Presented [facility] 2 episodes of diarrhea-found to have bedbugs Bimodal anemia found-Hemoccult negative  Hospital-Problem based course  Diarrhea Reported history but has not had any since admission No further work-up Hold loperamide at this time-will not be giving on discharge--h/o of needing ED disimpaction 2020 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 Obtain B12, RPR methylmalonic acid  Per OT the Short Blessed Test was administered with pt scoring 7/28 which is  indicative of questionable cognitive deficit  MMSE of 28/30  HFrEF EF previously 20 to 25% lower dose of Toprol to 25 XL daily Patient absolutely refuses to take Xarelto and has bizarre beliefs about this causing "diarrhea" Noncompliant previously with heart care [facility] Bimodal anemia Likely nutritional in origin given kinetics showing RDW is elevated Hemoccult negative on admit trasnfuse 4/28--hemoglobin still in the low 7 range-transfused 1 more unit PRBC 5/1 iron studies low- given IV iron this admission Start ferrous sulfate twice daily Set up nonemergent colonoscopy outpatient setting-hopeful she will continue to follow-up DM TY 2 Again noncompliant with all meds other than her metoprolol Will need outpatient reevaluation of the same Bedbugs, skin rash Twice daily triamcinolone 0.1%: Eucerin cream to arms and legs for scabbing and hyperkeratinization of the lateral aspects of the legs Lower extremities have improved Frailty and poor social situation Social work to look in on  patient  Patient has probable underlying dementia given OT assesment social work in the outpatient  to look in on her home situation APS formally involved and will follow-up in the outpatient setting    DVT prophylaxis: Lovenox Code Status: Full presumed Family Communication: None present Disposition:  Status is: Observation  The patient will require care spanning > 2 midnights and should be moved to inpatient because: Inpatient level of care appropriate due to severity of illness  Dispo: The patient is from: Home              Anticipated d/c is to:  home with home health               Patient currently is not medically stable to d/c.   Difficult to place patient No    Consultants:   None  Procedures:   Antimicrobials:     Subjective:  Eating breakfast this morning no distress No chest pain no fever Fixated on getting  Imodium Discussed with patient need for work-up and transfusion She indicates that she has to leave the hospital 5/3 because she has various appointments etc.  Objective: Vitals:   09/14/20 1015 09/14/20 1045 09/14/20 1256 09/14/20 1334  BP: (!) 116/59 115/76 121/78 114/72  Pulse: (!) 110 96 87 100  Resp:  16 17   Temp: 98.2 F (36.8 C) 98.3 F (36.8 C) 98.4 F (36.9 C) 98.2 F (36.8 C)  TempSrc: Oral Oral  Oral  SpO2: 97% 98% 95% 97%  Weight:        Intake/Output Summary (Last 24 hours) at 09/14/2020 1342 Last data filed at 09/14/2020 1334 Gross per 24 hour  Intake 940.5 ml  Output 2250 ml  Net -1309.5 ml  Filed Weights   09/12/20 0708  Weight: 112.5 kg    Examination:  Tangential overall pleasant better groomed EOMI NCAT thick neck Mallampati 4 Neck soft supple S1-S2 no murmur-on monitors he appears to sinus rhythm Abdomen obese nontender nondistended Hyperkeratotic areas on both sides of the legs up to the groin both outer and inner aspects of the thighs which is improved Pressured speech  Data Reviewed: personally reviewed    CBC    Component Value Date/Time   WBC 6.4 09/14/2020 0319   RBC 2.34 (L) 09/14/2020 0319   HGB 7.1 (L) 09/14/2020 0319   HCT 23.7 (L) 09/14/2020 0319   PLT 166 09/14/2020 0319   MCV 101.3 (H) 09/14/2020 0319   MCH 30.3 09/14/2020 0319   MCHC 30.0 09/14/2020 0319   RDW 19.5 (H) 09/14/2020 0319   LYMPHSABS 1.0 09/14/2020 0319   MONOABS 0.7 09/14/2020 0319   EOSABS 0.6 (H) 09/14/2020 0319   BASOSABS 0.1 09/14/2020 0319   CMP Latest Ref Rng & Units 09/14/2020 09/13/2020 09/12/2020  Glucose 70 - 99 mg/dL 93 391(S) 258(T)  BUN 8 - 23 mg/dL 16 17 14   Creatinine 0.44 - 1.00 mg/dL ) 4.62(T) 9.47(X)  Sodium 135 - 145 mmol/L 135 133(L) 134(L)  Potassium 3.5 - 5.1 mmol/L 4.0 4.1 4.1  Chloride 98 - 111 mmol/L 106 105 109  CO2 22 - 32 mmol/L 22 21(L) 21(L)  Calcium 8.9 - 10.3 mg/dL 8.1(L) 7.8(L) 8.1(L)  Total Protein 6.5 - 8.1 g/dL - - -  Total Bilirubin 0.3 - 1.2 mg/dL - - -  Alkaline Phos 38 - 126 U/L - - -  AST 15 - 41 U/L - - -  ALT 0 - 44 U/L - - -     Radiology Studies: No results found.   Scheduled Meds: . enoxaparin (LOVENOX) injection  40 mg Subcutaneous Q24H  . ferrous sulfate  325 mg Oral BID WC  . furosemide  20 mg Intravenous Once  . metoprolol succinate  25 mg Oral Daily  . multivitamin with minerals  1 tablet Oral Daily  . Ensure Max Protein  11 oz Oral BID  . sodium chloride flush  3 mL Intravenous Q12H  . triamcinolone 0.1 % cream : eucerin   Topical BID   Continuous Infusions:    LOS: 2 days   Time spent: 25 minutes  2.52(V, MD [facility] To contact the attending provider between 7A-7P or the covering provider during after hours 7P-7A, please log into the web site [email or web address] and access using universal [place] password for that web site. If you do not have the password, please call the hospital operator.  09/14/2020, 1:42 PM
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
## 2020-09-15 ENCOUNTER — Other Ambulatory Visit (HOSPITAL_COMMUNITY): Payer: Self-pay

## 2020-09-15 LAB — VITAMIN B12: Vitamin B-12: 116 pg/mL — ABNORMAL LOW (ref 180–914)

## 2020-09-15 LAB — RPR: RPR Ser Ql: NONREACTIVE

## 2020-09-15 LAB — CBC WITH DIFFERENTIAL/PLATELET
Abs Immature Granulocytes: 0.08 10*3/uL — ABNORMAL HIGH (ref 0.00–0.07)
Basophils Absolute: 0.1 10*3/uL (ref 0.0–0.1)
Basophils Relative: 1 %
Eosinophils Absolute: 0.5 10*3/uL (ref 0.0–0.5)
Eosinophils Relative: 8 %
HCT: 26.8 % — ABNORMAL LOW (ref 36.0–46.0)
Hemoglobin: 8 g/dL — ABNORMAL LOW (ref 12.0–15.0)
Immature Granulocytes: 1 %
Lymphocytes Relative: 15 %
Lymphs Abs: 1 10*3/uL (ref 0.7–4.0)
MCH: 30.3 pg (ref 26.0–34.0)
MCHC: 29.9 g/dL — ABNORMAL LOW (ref 30.0–36.0)
MCV: 101.5 fL — ABNORMAL HIGH (ref 80.0–100.0)
Monocytes Absolute: 0.6 10*3/uL (ref 0.1–1.0)
Monocytes Relative: 9 %
Neutro Abs: 4.6 10*3/uL (ref 1.7–7.7)
Neutrophils Relative %: 66 %
Platelets: 163 10*3/uL (ref 150–400)
RBC: 2.64 MIL/uL — ABNORMAL LOW (ref 3.87–5.11)
RDW: 19.3 % — ABNORMAL HIGH (ref 11.5–15.5)
WBC: 7 10*3/uL (ref 4.0–10.5)
nRBC: 0.3 % — ABNORMAL HIGH (ref 0.0–0.2)

## 2020-09-15 LAB — TYPE AND SCREEN
ABO/RH(D): A POS
Antibody Screen: NEGATIVE
Unit division: 0

## 2020-09-15 LAB — BPAM RBC
Blood Product Expiration Date: 202205252359
ISSUE DATE / TIME: 202205010959
Unit Type and Rh: 6200

## 2020-09-15 LAB — COMPREHENSIVE METABOLIC PANEL
ALT: 10 U/L (ref 0–44)
AST: 13 U/L — ABNORMAL LOW (ref 15–41)
Albumin: 2.9 g/dL — ABNORMAL LOW (ref 3.5–5.0)
Alkaline Phosphatase: 56 U/L (ref 38–126)
Anion gap: 7 (ref 5–15)
BUN: 18 mg/dL (ref 8–23)
CO2: 20 mmol/L — ABNORMAL LOW (ref 22–32)
Calcium: 8.1 mg/dL — ABNORMAL LOW (ref 8.9–10.3)
Chloride: 110 mmol/L (ref 98–111)
Creatinine, Ser: 1.31 mg/dL — ABNORMAL HIGH (ref 0.44–1.00)
GFR, Estimated: 44 mL/min — ABNORMAL LOW (ref >60)
Glucose, Bld: 101 mg/dL — ABNORMAL HIGH (ref 70–99)
Potassium: 4.5 mmol/L (ref 3.5–5.1)
Sodium: 137 mmol/L (ref 135–145)
Total Bilirubin: 0.8 mg/dL (ref 0.3–1.2)
Total Protein: 6.9 g/dL (ref 6.5–8.1)

## 2020-09-15 MED ORDER — TRIAMCINOLONE ACETONIDE 0.025 % EX OINT
1.0000 "application " | TOPICAL_OINTMENT | Freq: Two times a day (BID) | CUTANEOUS | 0 refills | Status: DC
  Filled 2020-09-15: qty 30, 15d supply, fill #0

## 2020-09-15 MED ORDER — FERROUS SULFATE 325 (65 FE) MG PO TABS: 325.0000 mg | ORAL_TABLET | Freq: Two times a day (BID) | ORAL | 3 refills | Status: DC

## 2020-09-15 MED ORDER — CYANOCOBALAMIN 1000 MCG PO TABS
1000.0000 ug | ORAL_TABLET | Freq: Every day | ORAL | 3 refills | Status: DC
  Filled 2020-09-15: qty 30, 30d supply, fill #0

## 2020-09-15 MED ORDER — ALBUTEROL SULFATE HFA 108 (90 BASE) MCG/ACT IN AERS
2.0000 | INHALATION_SPRAY | Freq: Four times a day (QID) | RESPIRATORY_TRACT | 0 refills | Status: DC | PRN
  Filled 2020-09-15: qty 18, 30d supply, fill #0

## 2020-09-15 MED ORDER — LOPERAMIDE HCL 2 MG PO CAPS: 4.0000 mg | ORAL_CAPSULE | Freq: Two times a day (BID) | ORAL | 0 refills | Status: DC | PRN

## 2020-09-15 MED ORDER — CYANOCOBALAMIN 1000 MCG/ML IJ SOLN
1000.0000 ug | INTRAMUSCULAR | Status: DC
  Filled 2020-09-15: qty 1

## 2020-09-15 MED ORDER — LOPERAMIDE HCL 2 MG PO CAPS
2.0000 mg | ORAL_CAPSULE | ORAL | Status: DC | PRN
  Administered 2020-09-15: 2 mg via ORAL
  Filled 2020-09-15: qty 1

## 2020-09-15 MED ORDER — METOPROLOL SUCCINATE ER 25 MG PO TB24
25.0000 mg | ORAL_TABLET | Freq: Every day | ORAL | 1 refills | Status: DC
  Filled 2020-09-15: qty 30, 30d supply, fill #0

## 2020-09-15 MED ORDER — METOPROLOL SUCCINATE ER 25 MG PO TB24: 25.0000 mg | ORAL_TABLET | Freq: Every day | ORAL | 1 refills | Status: DC

## 2020-09-15 MED ORDER — LOPERAMIDE HCL 2 MG PO TABS: 2.0000 mg | ORAL_TABLET | Freq: Four times a day (QID) | ORAL | 0 refills | Status: DC | PRN

## 2020-09-15 MED ORDER — LOPERAMIDE HCL 2 MG PO CAPS
2.0000 mg | ORAL_CAPSULE | Freq: Four times a day (QID) | ORAL | 0 refills | Status: DC | PRN
  Filled 2020-09-15: qty 30, 8d supply, fill #0

## 2020-09-15 MED ORDER — TRIAMCINOLONE 0.1 % CREAM:EUCERIN CREAM 1:1: 1.0000 "application " | TOPICAL_CREAM | Freq: Two times a day (BID) | CUTANEOUS | 0 refills | Status: DC

## 2020-09-15 MED ORDER — VITAMIN B-12 100 MCG PO TABS: 100.0000 ug | ORAL_TABLET | Freq: Every day | ORAL | 3 refills | Status: DC

## 2020-09-15 MED ORDER — TRIAMCINOLONE 0.1 % CREAM:EUCERIN CREAM 1:1
1.0000 | TOPICAL_CREAM | Freq: Two times a day (BID) | CUTANEOUS | 0 refills | Status: DC
Start: 2020-09-15 — End: 2020-09-15
  Filled 2020-09-15: qty 1, 1d supply, fill #0

## 2020-09-15 MED ORDER — FERROUS SULFATE 325 (65 FE) MG PO TABS
325.0000 mg | ORAL_TABLET | Freq: Two times a day (BID) | ORAL | 3 refills | Status: DC
  Filled 2020-09-15: qty 30, 15d supply, fill #0
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
## 2020-09-15 NOTE — Progress Notes (Signed)
This nurse called security to ask about patient's belongings. According to the security they do not have any belongings with the patient's name. Per patient she had a white zipped bag with her house keys, laptop, and pocket book. Per admission documentation, patient had no belongings with her when she came to the floor. [facility] representative made aware of the situation.

## 2020-09-15 NOTE — Progress Notes (Signed)
Occupational Therapy Treatment Patient Details Name: Kaitlyn [name] MRN: [id number] DOB: 19-Feb-1951 Today's Date: 09/15/2020    History of present illness 70 yo female [facility] from home with diarrhea, anemia, skin rash, bed bug infestation. PMH includes aortic insufficiency, OA, HF, HTN, afib, seizures, DM, covid-19 05/2020, NSTEMI.   OT comments  Pt distracted by desire to find her belongings before she goes home. RN aware. OT looked in ED lost and found and did not find bag with pt's name. Pt demonstrated transfer bed<>BSC using squat pivot. Educated in compensatory strategies for pericare and LB dressing. Pt refusing offer of AE. Reports she only wears slippers at home. She uses a desk chair and w/c for all mobility and reports having her phone with her at all times since she as fallen in the past.   Follow Up Recommendations  SNF (pt refusing any follow up OT)    Equipment Recommendations  None recommended by OT    Recommendations for Other Services      Precautions / Restrictions Precautions Precautions: Fall Precaution Comments: h/o falls       Mobility Bed Mobility Overal bed mobility: Modified Independent             General bed mobility comments: HOB up    Transfers                      Balance Overall balance assessment: Needs assistance;History of Falls Sitting-balance support: No upper extremity supported;Feet supported Sitting balance-Leahy Scale: Good                                     ADL either performed or assessed with clinical judgement   ADL Overall ADL's : Needs assistance/impaired     Grooming: Set up;Sitting         Lower Body Bathing Details (indicate cue type and reason): offered reacher and long bath sponge, refused Upper Body Dressing : Set up;Sitting   Lower Body Dressing: Set up;Sitting/lateral leans Lower Body Dressing Details (indicate cue type and reason): slip on shoes, pt declined  reacher and sock aid, states she only wears slippers at home Toilet Transfer: Min guard;Squat-pivot;BSC   Toileting- Clothing Manipulation and Hygiene: Supervision/safety;Sitting/lateral lean         General ADL Comments: Instructed to lean side to side to pull pants over hips, dress in bed using bridging.     Vision       Perception     Praxis      Cognition Arousal/Alertness: Awake/alert Behavior During Therapy: WFL for tasks assessed/performed Overall Cognitive Status: History of cognitive impairments - at baseline                                 General Comments: repeats herself, decreased awareness of deficits and safety        Exercises     Shoulder Instructions       General Comments      Pertinent Vitals/ Pain       Pain Assessment: No/denies pain  Home Living                                          Prior Functioning/Environment  Frequency  Min 2X/week        Progress Toward Goals  OT Goals(current goals can now be found in the care plan section)  Progress towards OT goals: Progressing toward goals  Acute Rehab OT Goals Patient Stated Goal: to find her belongings OT Goal Formulation: With patient Time For Goal Achievement: 09/26/20 Potential to Achieve Goals: Good  Plan Discharge plan remains appropriate (pt refusing any further rehab)    Co-evaluation                 AM-PAC OT "6 Clicks" Daily Activity     Outcome Measure   Help from another person eating meals?: None Help from another person taking care of personal grooming?: A Little Help from another person toileting, which includes using toliet, bedpan, or urinal?: A Little Help from another person bathing (including washing, rinsing, drying)?: A Lot Help from another person to put on and taking off regular upper body clothing?: A Little Help from another person to put on and taking off regular lower body clothing?: A Lot 6  Click Score: 17    End of Session    OT Visit Diagnosis: Unsteadiness on feet (R26.81);Cognitive communication deficit (R41.841)   Activity Tolerance Patient tolerated treatment well   Patient Left in bed;with call bell/phone within reach;with bed alarm set   Nurse Communication          Time: [phone] OT Time Calculation (min): 24 min  Charges: OT General Charges $OT Visit: 1 Visit OT Treatments $Self Care/Home Management : 23-37 mins  Martie Round, OTR/L Acute Rehabilitation Services Pager: [phone] Office: [phone]  Evern Bio 09/15/2020, 3:52 PM

## 2020-09-15 NOTE — Progress Notes (Signed)
PT Cancellation Note  Patient Details Name: Kaitlyn [name] MRN: [id number] DOB: February 12, 1951   Cancelled Treatment:    Reason Eval/Treat Not Completed: Other (comment).  Pt declined therapy, stated she is leaving today.  Pt is upset about belongings that were brought to ED and did not come to her room.  Pt's belongings had to be bagged from bedbugs, and staff are looking for the bag now.  Follow up at another time as pt will allow.   Ivar Drape 09/15/2020, 2:59 PM Samul Dada, PT MS Acute Rehab Dept. Number: [facility] [id number] and [facility] [phone]

## 2020-09-15 NOTE — Plan of Care (Signed)

## 2020-09-15 NOTE — Discharge Summary (Addendum)
Physician Discharge Summary  Kaitlyn [name] OIN:[id number] DOB: March 19, 1951 DOA: 09/10/2020  PCP: Kallie Locks, FNP (Inactive)  Admit date: 09/10/2020 Discharge date: 09/15/2020  Time spent: 37 minutes  Recommendations for Outpatient Follow-up:  1. Patient very high risk for readmission-significant medical noncompliance 2. She is unlikely to comply with any medical recommendations set up and made in the past  Discharge Diagnoses:  MAIN problem for hospitalization   Self-reported diarrhea  Please see below for itemized issues addressed in HOpsital- refer to other progress notes for clarity if needed  Discharge Condition: Guarded  Diet recommendation: DM TY 2 diet  Filed Weights   09/12/20 0708  Weight: 112.5 kg    History of present illness:  70 year old female HFrEF EF 20-25% Permanent A. fib, NSVT supposed to be on Eliquis (taking only once a day not twice a day) CHADS2 score >5 DM TY 2 Remote seizure history COVID-19  Documented medical noncompliance 06/16/2020-has been fired by [facility] heart care-has refused cardiac cath in the past  Presented [facility] 2 episodes of diarrhea-found to have bedbugs Bimodal anemia found-Hemoccult negative  Hospital Course:  Diarrhea Reported history but has not had any since admission No further work-up Patient has a history of impaction of stool in the past I have given her very limited prescription of Imodium going forward 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 B12 noted to be low-patient refused subcutaneous injection We will discharge the patient home on oral B12 I do not know if she has other significant psych comorbidities and will probably need a psychiatrist to see her if she will comply             Per OT the Short Blessed Test was administered with pt scoring 7/28 which is      indicative of questionable cognitive deficit             MMSE of 28/30            HFrEF EF previously 20 to 25% Paroxysmal A. fib lower dose  of Toprol to 25 XL and prescribed this on discharge-I have given her a 1 month supply She needs a PCP on discharge Patient absolutely refuses to take Xarelto and has bizarre beliefs about this causing "diarrhea" Noncompliant previously with heart care [facility] Bimodal anemia Likely nutritional in origin given kinetics showing RDW is elevated Hemoccult negative on admit trasnfuse 4/28--hemoglobin still in the low 7 range-transfused 1 more unit PRBC 5/1 iron studies low- given IV iron this admission Start ferrous sulfate twice daily Patient will likely refuse colonoscopy Patient was given B12 tablets on discharge DM TY 2 Again noncompliant with all meds other than her metoprolol Will need outpatient reevaluation of the same Bedbugs, skin rash Patient will need follow-up with primary care physician to get meds-have enlisted TOC to help her get follow-up and find a PCP Lower extremities have improved Frailty and poor social situation Social work to look in on patient  Patient has probable underlying dementia given OT assesment social work in the outpatient  to look in on her home situation APS formally involved and will follow-up in the outpatient setting   Consultations:  Psychiatry  Discharge Exam: Vitals:   09/15/20 0800 09/15/20 1021  BP:  129/80  Pulse:  95  Resp:  20  Temp:  98.4 F (36.9 C)  SpO2: 95% 98%    Subj on day of d/c   Patient insistent on discharging home today She refused B12 shot She tells me that she has  bills to pay and not things to take care of APS report has been filed-they will follow-up in the outpatient with her I have explained to her clearly that I will only give her 1 month supply of her meds We will enlist TOC to see if she can get resources to get a PCP  General Exam on discharge  Awake disheveled no distress Conversant EOMI NCAT no focal deficit S1-S2 no murmur Chest clear Abdomen soft no rebound no guarding Hyperkeratinization lateral  aspects of both legs and inner legs which is improved from prior Abdomen obese nontender nondistended no rebound  Discharge Instructions   Discharge Instructions    Diet - low sodium heart healthy   Complete by: As directed    Discharge instructions   Complete by: As directed    Please take your medication as we have directed  I will only prescribe 1 refill of some of your meds as you need to follow-up and establish care with a primary care physician You have been recommended to get blood work probably in the next 1 to 2 weeks and he will probably need an outpatient colonoscopy I have  prescribed for you B12 iron which you should take in addition to your metoprolol-at your request we refilled some imodium Would recommend using the cream on both sides of your leg in addition to getting the area cleaned up-please obtain this from the pharmacy   Increase activity slowly   Complete by: As directed      Allergies as of 09/15/2020      Reactions   Caffeine Palpitations, Other (See Comments)   Seizure from large doses, heart races from small doses   Cranberry Shortness Of Breath, Diarrhea, Itching, Other (See Comments)   Severe headache.."Ocean Spray cranberry juice"   Lisinopril Diarrhea, Itching, Swelling   Site of swelling not recalled   Penicillins Other (See Comments)   Unknown childhood allergy Has patient had a PCN reaction causing immediate rash, facial/tongue/throat swelling, SOB or lightheadedness with hypotension: unknown Has patient had a PCN reaction causing severe rash involving mucus membranes or skin necrosis: unknown Has patient had a PCN reaction that required hospitalization unknown Has patient had a PCN reaction occurring within the last 10 years: no If all of the above answers are "NO", then may proceed with Cephalosporin use.   Cranberry Juice Concentrate Tilman Neat Extract]    Headaches   Other Other (See Comments)   Stimulants- patient has a past history of  seizures      Medication List    STOP taking these medications   Januvia 25 MG tablet Generic drug: sitaGLIPtin   potassium chloride SA 20 MEQ tablet Commonly known as: KLOR-CON   Xarelto 20 MG Tabs tablet Generic drug: rivaroxaban     TAKE these medications   albuterol 108 (90 Base) MCG/ACT inhaler Commonly known as: VENTOLIN HFA Inhale 2 puffs into the lungs every 6 (six) hours as needed for wheezing or shortness of breath.   cyanocobalamin 1000 MCG tablet Take 1 tablet (1,000 mcg total) by mouth daily.   ferrous sulfate 325 (65 FE) MG tablet Take 1 tablet (325 mg total) by mouth 2 (two) times daily with a meal.   loperamide 2 MG capsule Commonly known as: IMODIUM Take 2 capsules (4 mg total) by mouth 2 (two) times daily as needed for diarrhea or loose stools. What changed:   how much to take  how to take this  when to take this  reasons to take  this   loperamide 2 MG capsule Commonly known as: Imodium A-D Take 1 capsule (2 mg total) by mouth 4 (four) times daily as needed for diarrhea or loose stools. What changed: You were already taking a medication with the same name, and this prescription was added. Make sure you understand how and when to take each.   metoprolol succinate 25 MG 24 hr tablet Commonly known as: TOPROL-XL Take 1 tablet (25 mg total) by mouth daily. Start taking on: Sep 16, 2020 What changed:   medication strength  how much to take  additional instructions      Allergies  Allergen Reactions  . Caffeine Palpitations and Other (See Comments)    Seizure from large doses, heart races from small doses  . Cranberry Shortness Of Breath, Diarrhea, Itching and Other (See Comments)    Severe headache.."Ocean Spray cranberry juice"  . Lisinopril Diarrhea, Itching and Swelling    Site of swelling not recalled  . Penicillins Other (See Comments)    Unknown childhood allergy Has patient had a PCN reaction causing immediate rash,  facial/tongue/throat swelling, SOB or lightheadedness with hypotension: unknown Has patient had a PCN reaction causing severe rash involving mucus membranes or skin necrosis: unknown Has patient had a PCN reaction that required hospitalization unknown Has patient had a PCN reaction occurring within the last 10 years: no If all of the above answers are "NO", then may proceed with Cephalosporin use.   . Cranberry Juice Concentrate [Cranberry Extract]     Headaches  . Other Other (See Comments)    Stimulants- patient has a past history of seizures      The results of significant diagnostics from this hospitalization (including imaging, microbiology, ancillary and laboratory) are listed below for reference.    Significant Diagnostic Studies: No results found.  Microbiology: Recent Results (from the past 240 hour(s))  SARS CORONAVIRUS 2 (TAT 6-24 HRS) Nasopharyngeal Nasopharyngeal Swab     Status: None   Collection Time: 09/11/20  6:18 AM   Specimen: Nasopharyngeal Swab  Result Value Ref Range Status   SARS Coronavirus 2 NEGATIVE NEGATIVE Final    Comment: (NOTE) SARS-CoV-2 target nucleic acids are NOT DETECTED.  The SARS-CoV-2 RNA is generally detectable in upper and lower respiratory specimens during the acute phase of infection. Negative results do not preclude SARS-CoV-2 infection, do not rule out co-infections with other pathogens, and should not be used as the sole basis for treatment or other patient management decisions. Negative results must be combined with clinical observations, patient history, and epidemiological information. The expected result is Negative.  Fact Sheet for Patients: HairSlick.no  Fact Sheet for Healthcare Providers: quierodirigir.com  This test is not yet approved or cleared by the [place] FDA and  has been authorized for detection and/or diagnosis of SARS-CoV-2 by FDA under an Emergency  Use Authorization (EUA). This EUA will remain  in effect (meaning this test can be used) for the duration of the COVID-19 declaration under Se ction 564(b)(1) of the Act, 21 U.S.C. section 360bbb-3(b)(1), unless the authorization is terminated or revoked sooner.  Performed at [facility], [address] [place], [place] [address]      Labs: Basic Metabolic Panel: Recent Labs  Lab 09/11/20 0011 09/12/20 0007 09/13/20 0124 09/14/20 0319 09/15/20 0221  NA 137 134* 133* 135 137  K 4.1 4.1 4.1 4.0 4.5  CL 110 109 105 106 110  CO2 22 21* 21* 22 20*  GLUCOSE 117* 127* 110* 93 101*  BUN 14 14 17 16 18   CREATININE 1.15* 1.22* 1.11* 1.11* 1.31*  CALCIUM 8.2* 8.1* 7.8* 8.1* 8.1*  PHOS  --   --  3.1 3.1  --    Liver Function Tests: Recent Labs  Lab 09/11/20 0011 09/13/20 0124 09/14/20 0319 09/15/20 0221  AST 14*  --   --  13*  ALT 9  --   --  10  ALKPHOS 54  --   --  56  BILITOT 0.4  --   --  0.8  PROT 6.8  --   --  6.9  ALBUMIN 3.0* 2.9* 2.9* 2.9*   No results for input(s): LIPASE, AMYLASE in the last 168 hours. No results for input(s): AMMONIA in the last 168 hours. CBC: Recent Labs  Lab 09/11/20 0011 09/11/20 0528 09/12/20 0007 09/12/20 0452 09/13/20 0124 09/14/20 0319 09/15/20 0221  WBC 6.9   < > 6.5 4.8 6.4 6.4 7.0  NEUTROABS 4.6  --   --   --  4.0 4.1 4.6  HGB 7.5*   < > 6.9* 6.5* 7.2* 7.1* 8.0*  HCT 25.5*   < > 23.1* 21.9* 24.0* 23.7* 26.8*  MCV 108.1*   < > 106.0* 106.3* 99.6 101.3* 101.5*  PLT 192   < > 210 161 185 166 163   < > = values in this interval not displayed.   Cardiac Enzymes: No results for input(s): CKTOTAL, CKMB, CKMBINDEX, TROPONINI in the last 168 hours. BNP: BNP (last 3 results) Recent Labs    06/16/20 0418  BNP 144.1*    ProBNP (last 3 results) No results for input(s): PROBNP in the last 8760 hours.  CBG: Recent Labs  Lab 09/11/20 1535 09/11/20 2048 09/12/20 0735 09/12/20 1155 09/12/20 1649  GLUCAP 101* 102*  107* 110* 99       Signed:  09/14/20 MD   [facility] 09/15/2020, 11:22 AM

## 2020-09-15 NOTE — Progress Notes (Signed)
At 1700 pm this nurse called [phone] the number given by CSW for transportation.  [id number]: The patient was wheeled to the front entrance. The transportation that came to pick up the patient did not have wheelchair with them. Patient not able to ambulate and not able to be discharged at this time with the transportation that was here. Patient returned back to the floor. Charge nurse made aware of the situation and social worker oncall contacted.

## 2020-09-15 NOTE — Progress Notes (Signed)
[facility] called for transportation and per them they will be here around 10pm.

## 2020-09-16 ENCOUNTER — Other Ambulatory Visit: Payer: Self-pay

## 2020-09-16 ENCOUNTER — Encounter (HOSPITAL_COMMUNITY): Payer: Self-pay | Admitting: Emergency Medicine

## 2020-09-16 ENCOUNTER — Emergency Department (HOSPITAL_COMMUNITY): Payer: Medicare (Managed Care)

## 2020-09-16 ENCOUNTER — Emergency Department (HOSPITAL_COMMUNITY)
Admission: EM | Admit: 2020-09-16 | Discharge: 2020-09-17 | Disposition: A | Discharge status: Home / Self Care | Payer: Medicare (Managed Care) | Attending: Emergency Medicine | Admitting: Emergency Medicine

## 2020-09-16 DIAGNOSIS — E119 Type 2 diabetes mellitus without complications: Secondary | ICD-10-CM | POA: Insufficient documentation

## 2020-09-16 DIAGNOSIS — R000 Tachycardia, unspecified: Secondary | ICD-10-CM | POA: Insufficient documentation

## 2020-09-16 DIAGNOSIS — R0602 Shortness of breath: Secondary | ICD-10-CM | POA: Insufficient documentation

## 2020-09-16 DIAGNOSIS — R21 Rash and other nonspecific skin eruption: Secondary | ICD-10-CM | POA: Insufficient documentation

## 2020-09-16 DIAGNOSIS — R2689 Other abnormalities of gait and mobility: Secondary | ICD-10-CM | POA: Insufficient documentation

## 2020-09-16 DIAGNOSIS — Z79899 Other long term (current) drug therapy: Secondary | ICD-10-CM | POA: Insufficient documentation

## 2020-09-16 DIAGNOSIS — E089 Diabetes mellitus due to underlying condition without complications: Secondary | ICD-10-CM

## 2020-09-16 DIAGNOSIS — Z9119 Patient's noncompliance with other medical treatment and regimen: Secondary | ICD-10-CM

## 2020-09-16 DIAGNOSIS — B888 Other specified infestations: Secondary | ICD-10-CM

## 2020-09-16 DIAGNOSIS — I4891 Unspecified atrial fibrillation: Secondary | ICD-10-CM

## 2020-09-16 DIAGNOSIS — Z7901 Long term (current) use of anticoagulants: Secondary | ICD-10-CM | POA: Insufficient documentation

## 2020-09-16 DIAGNOSIS — I110 Hypertensive heart disease with heart failure: Secondary | ICD-10-CM | POA: Insufficient documentation

## 2020-09-16 DIAGNOSIS — I5022 Chronic systolic (congestive) heart failure: Secondary | ICD-10-CM | POA: Diagnosis present

## 2020-09-16 DIAGNOSIS — Z91199 Patient's noncompliance with other medical treatment and regimen due to unspecified reason: Secondary | ICD-10-CM

## 2020-09-16 DIAGNOSIS — I5043 Acute on chronic combined systolic (congestive) and diastolic (congestive) heart failure: Secondary | ICD-10-CM | POA: Insufficient documentation

## 2020-09-16 DIAGNOSIS — Z20822 Contact with and (suspected) exposure to covid-19: Secondary | ICD-10-CM | POA: Insufficient documentation

## 2020-09-16 DIAGNOSIS — Z8616 Personal history of COVID-19: Secondary | ICD-10-CM | POA: Insufficient documentation

## 2020-09-16 DIAGNOSIS — L539 Erythematous condition, unspecified: Secondary | ICD-10-CM | POA: Insufficient documentation

## 2020-09-16 DIAGNOSIS — I482 Chronic atrial fibrillation, unspecified: Secondary | ICD-10-CM | POA: Diagnosis present

## 2020-09-16 DIAGNOSIS — I2510 Atherosclerotic heart disease of native coronary artery without angina pectoris: Secondary | ICD-10-CM | POA: Insufficient documentation

## 2020-09-16 LAB — URINALYSIS, ROUTINE W REFLEX MICROSCOPIC
Bilirubin Urine: NEGATIVE
Glucose, UA: NEGATIVE mg/dL
Ketones, ur: NEGATIVE mg/dL
Nitrite: NEGATIVE
Protein, ur: NEGATIVE mg/dL
Specific Gravity, Urine: 1.01 (ref 1.005–1.030)
pH: 6 (ref 5.0–8.0)

## 2020-09-16 LAB — I-STAT CHEM 8, ED
BUN: 23 mg/dL (ref 8–23)
Calcium, Ion: 1.1 mmol/L — ABNORMAL LOW (ref 1.15–1.40)
Chloride: 108 mmol/L (ref 98–111)
Creatinine, Ser: 1.3 mg/dL — ABNORMAL HIGH (ref 0.44–1.00)
Glucose, Bld: 150 mg/dL — ABNORMAL HIGH (ref 70–99)
HCT: 29 % — ABNORMAL LOW (ref 36.0–46.0)
Hemoglobin: 9.9 g/dL — ABNORMAL LOW (ref 12.0–15.0)
Potassium: 4.5 mmol/L (ref 3.5–5.1)
Sodium: 138 mmol/L (ref 135–145)
TCO2: 21 mmol/L — ABNORMAL LOW (ref 22–32)

## 2020-09-16 LAB — CBC WITH DIFFERENTIAL/PLATELET
Abs Immature Granulocytes: 0.12 10*3/uL — ABNORMAL HIGH (ref 0.00–0.07)
Basophils Absolute: 0.1 10*3/uL (ref 0.0–0.1)
Basophils Relative: 1 %
Eosinophils Absolute: 0.4 10*3/uL (ref 0.0–0.5)
Eosinophils Relative: 4 %
HCT: 31.1 % — ABNORMAL LOW (ref 36.0–46.0)
Hemoglobin: 9.1 g/dL — ABNORMAL LOW (ref 12.0–15.0)
Immature Granulocytes: 1 %
Lymphocytes Relative: 7 %
Lymphs Abs: 0.7 10*3/uL (ref 0.7–4.0)
MCH: 30.7 pg (ref 26.0–34.0)
MCHC: 29.3 g/dL — ABNORMAL LOW (ref 30.0–36.0)
MCV: 105.1 fL — ABNORMAL HIGH (ref 80.0–100.0)
Monocytes Absolute: 0.8 10*3/uL (ref 0.1–1.0)
Monocytes Relative: 7 %
Neutro Abs: 9.2 10*3/uL — ABNORMAL HIGH (ref 1.7–7.7)
Neutrophils Relative %: 80 %
Platelets: 182 10*3/uL (ref 150–400)
RBC: 2.96 MIL/uL — ABNORMAL LOW (ref 3.87–5.11)
RDW: 19.4 % — ABNORMAL HIGH (ref 11.5–15.5)
WBC: 11.3 10*3/uL — ABNORMAL HIGH (ref 4.0–10.5)
nRBC: 0 % (ref 0.0–0.2)

## 2020-09-16 LAB — CBG MONITORING, ED
Glucose-Capillary: 140 mg/dL — ABNORMAL HIGH (ref 70–99)
Glucose-Capillary: 130 mg/dL — ABNORMAL HIGH (ref 70–99)
Glucose-Capillary: 104 mg/dL — ABNORMAL HIGH (ref 70–99)
Glucose-Capillary: 108 mg/dL — ABNORMAL HIGH (ref 70–99)

## 2020-09-16 LAB — TROPONIN I (HIGH SENSITIVITY)
Troponin I (High Sensitivity): 7 ng/L (ref <18)
Troponin I (High Sensitivity): 7 ng/L (ref <18)

## 2020-09-16 LAB — RESP PANEL BY RT-PCR (FLU A&B, COVID) ARPGX2
Influenza A by PCR: NEGATIVE
Influenza B by PCR: NEGATIVE
SARS Coronavirus 2 by RT PCR: NEGATIVE

## 2020-09-16 MED ORDER — LOPERAMIDE HCL 2 MG PO CAPS
2.0000 mg | ORAL_CAPSULE | Freq: Four times a day (QID) | ORAL | Status: DC | PRN
  Administered 2020-09-16 (×2): 2 mg via ORAL
  Filled 2020-09-16 (×2): qty 1

## 2020-09-16 MED ORDER — ALBUTEROL SULFATE HFA 108 (90 BASE) MCG/ACT IN AERS: 2.0000 | INHALATION_SPRAY | Freq: Four times a day (QID) | RESPIRATORY_TRACT | Status: DC | PRN

## 2020-09-16 MED ORDER — DILTIAZEM HCL-DEXTROSE 125-5 MG/125ML-% IV SOLN (PREMIX)
5.0000 mg/h | INTRAVENOUS | Status: DC
  Administered 2020-09-16: 5 mg/h via INTRAVENOUS
  Filled 2020-09-16: qty 125

## 2020-09-16 MED ORDER — DILTIAZEM LOAD VIA INFUSION
15.0000 mg | Freq: Once | INTRAVENOUS | Status: AC
  Administered 2020-09-16: 15 mg via INTRAVENOUS
  Filled 2020-09-16: qty 15

## 2020-09-16 MED ORDER — METOPROLOL SUCCINATE ER 25 MG PO TB24
25.0000 mg | ORAL_TABLET | Freq: Every day | ORAL | Status: DC
  Administered 2020-09-16: 25 mg via ORAL
  Filled 2020-09-16: qty 1

## 2020-09-16 MED ORDER — FERROUS SULFATE 325 (65 FE) MG PO TABS
325.0000 mg | ORAL_TABLET | Freq: Two times a day (BID) | ORAL | Status: DC
  Filled 2020-09-16: qty 1

## 2020-09-16 MED ORDER — METOPROLOL TARTRATE 25 MG PO TABS
25.0000 mg | ORAL_TABLET | Freq: Once | ORAL | Status: AC
  Administered 2020-09-16: 25 mg via ORAL
  Filled 2020-09-16: qty 1

## 2020-09-16 NOTE — ED Triage Notes (Signed)
Pt here from home via EMS no true medical complaints. Has bed bug infestation at home. Was discharged from hospital at 2200. On triage, pt complaining of pain from laying in bed during hospital admission.

## 2020-09-16 NOTE — Progress Notes (Signed)
CSW contacted patient on her ED room phone. CSW explained that it was recommended that patient goes to a skilled nursing facility. Patient asked CSW what that was. CSW explained process to patient. Patient stated she will go until her apartment is fumigated. CSW stated it does not work like that and the facility will determine when its appropriate for her to discharge from their facility. Patient stated she does not know if she can do. CSW stated she can give her some time to think abot and CSW can call back. Patient stated no and that she does not want to go to SNF. CSW stated if she does not agree to go to SNF then she will have to speak with the provider about her discharging home. Patient stated that was fine and that she wanted to go home. CSW cannot force patient to go to SNF and she has the right to make her own decisions. Home health also not an option due to the conditions of patients home.

## 2020-09-16 NOTE — Progress Notes (Signed)
CSW called patient in her room. CSW asked patient if she was interested in SNF if she qualifies. CSW told patient that she sees that she turned down SNF and other referrals during her previous visit to the hospital. Patient stated she didn't want any of that and wanted to go home to her apartment. Patient then stated that she is in the hallway cold and hasn't had any food or drink. Patient started whining and saying that she just wants to sleep and that she was told she could go to SNF for a few days and then go home. CSW stated it does not work like that. CSW stated that PT will come down and see her to evaluate if she meets criteria for SNF and then CSW will complete the referral. CSW could not determine if patient is going to participate and then told CSW that she is going to hang up now.

## 2020-09-16 NOTE — ED Provider Notes (Signed)
70 yo F with a chief complaints of shortness of breath.  Patient was just in the hospital and discharged last night came back because of shortness of breath and heart palpitations.  Attempt to admit to the hospitalist service, they felt she was more here for her poor living situation and they had offered her skilled nursing which she had declined.  Plan for reassessment for skilled nursing placement.  Patient transiently went back into A. fib with RVR but improved with her oral medications.  Initially was agreeable to trying to find placement but later when she realized it was for longer than a couple days she decided to go back home.   Melene Plan, DO 09/16/20 1447

## 2020-09-16 NOTE — Discharge Instructions (Signed)
Take your medications as prescribed.  You have been recommended to go to a nursing home. Get your home fumigated.  Return at any time you wish to be seen

## 2020-09-16 NOTE — Discharge Planning (Signed)
Please update Ms Lisette Abu with [facility] [phone]) with discharge information.
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
## 2020-09-16 NOTE — Progress Notes (Signed)
OT Cancellation Note  Patient Details Name: Kaitlyn [name] MRN: [id number] DOB: 05-Apr-1951   Cancelled Treatment:    Reason Eval/Treat Not Completed: Other (comment) Pt seen yesterday by OT and recommendations were SNF. Discussed with PT who saw pt today and recommendations remain SNF, however pt apparently not agreeable. If OT needed, please reorder.   Fiora Weill,HILLARY 09/16/2020, 10:46 AM  Luisa Dago, OT/L   Acute OT Clinical Specialist Acute Rehabilitation Services Pager [phone] Office [phone]

## 2020-09-16 NOTE — ED Notes (Signed)
Patient being taken straight to decon bathroom for decon of bed bugs.

## 2020-09-16 NOTE — ED Notes (Signed)
Patient infested with bed bugs.  EMS reports apartment is infested and bed bugs are seen crawling on walls and on patient.
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
## 2020-09-16 NOTE — ED Provider Notes (Signed)
Notified pt is not a candidate for SNF.  Pt seen by social work. Pt only wanted to stay a couple of days which is not an option for a SNF   Linwood Dibbles, MD 09/16/20 1836

## 2020-09-16 NOTE — ED Notes (Signed)
Pt is 10th on the list for PTAR to be transported back home. PTAR is aware of bed bug situation at patients residence.

## 2020-09-16 NOTE — ED Notes (Signed)
Pt given meal tray. Pt eating lunch at this time.  

## 2020-09-16 NOTE — Evaluation (Signed)
Physical Therapy Evaluation Patient Details Name: Kaitlyn [name] MRN: [id number] DOB: 1951/04/21 Today's Date: 09/16/2020   History of Present Illness  Pt is a 70 y/o female presenting to the ED on 5/3 secondary to bed bug infestation and rash. Pt discharged on 5/2 secondary to similar situation but also had diarrhea and anemia. PMH includes CHF, DM, HTN, and a fib.  Clinical Impression  Pt presenting secondary to problem above with deficits below. Pt anxious during mobility tasks and very tangential throughout session. Sat up at EOB and then reporting increased pain, so impulsively returned to supine. Required min A for bed mobility. Did note that HR elevated to 143 during mobility; RN present and aware. Given current deficits, feel pt would benefit from SNF level therapies. Will continue to follow acutely.     Follow Up Recommendations SNF;Supervision/Assistance - 24 hour    Equipment Recommendations  None recommended by PT    Recommendations for Other Services       Precautions / Restrictions Precautions Precautions: Fall Precaution Comments: h/o falls Restrictions Weight Bearing Restrictions: No      Mobility  Bed Mobility Overal bed mobility: Needs Assistance Bed Mobility: Supine to Sit;Sit to Supine     Supine to sit: Min assist Sit to supine: Min assist   General bed mobility comments: Min A for LE assist to sit at EOB. Pt reporting increased pain during transfers and impulsively laying back down. HR elevated to 143 during bed mobility; RN in room and aware.    Transfers                    Ambulation/Gait                Stairs            Wheelchair Mobility    Modified Rankin (Stroke Patients Only)       Balance Overall balance assessment: Needs assistance Sitting-balance support: No upper extremity supported;Feet supported Sitting balance-Leahy Scale: Fair                                       Pertinent  Vitals/Pain Pain Assessment: Faces Faces Pain Scale: Hurts little more Pain Location: BLE and bottom Pain Descriptors / Indicators: Aching Pain Intervention(s): Limited activity within patient's tolerance;Monitored during session;Repositioned    Home Living Family/patient expects to be discharged to:: Private residence Living Arrangements: Alone Available Help at Discharge: Friend(s);Available PRN/intermittently Type of Home: Apartment Home Access: Level entry     Home Layout: One level Home Equipment: Walker - 2 wheels;Wheelchair - manual;Bedside commode Additional Comments: has a rolling office chair she uses to get around apt with    Prior Function Level of Independence: Independent with assistive device(s);Needs assistance   Gait / Transfers Assistance Needed: pt reports using RW for short distances, mostly transfer level with use of RW and rolling office chair  ADL's / Homemaking Assistance Needed: pt reports wearing depends, states she walks to the bathroom with her RW or uses her office chair to reach bathroom. Pt gets groceries delivered, has a barter system with neighbors where they trade food for toilet paper. Pt sponge bathes, but provides inconsistent info re: how she performs peri care as she states she can't bathe her backside.        Hand Dominance        Extremity/Trunk Assessment   Upper Extremity Assessment Upper Extremity Assessment:  Defer to OT evaluation    Lower Extremity Assessment Lower Extremity Assessment: Generalized weakness (rash noted on BLE)    Cervical / Trunk Assessment Cervical / Trunk Assessment: Kyphotic  Communication   Communication: No difficulties  Cognition Arousal/Alertness: Awake/alert Behavior During Therapy: Anxious Overall Cognitive Status: No family/caregiver present to determine baseline cognitive functioning                                 General Comments: Tangential throughout and required redirection  to task. Very anxious during mobility tasks.      General Comments      Exercises     Assessment/Plan    PT Assessment Patient needs continued PT services  PT Problem List Decreased strength;Decreased mobility;Decreased safety awareness;Decreased activity tolerance;Decreased balance;Decreased knowledge of use of DME;Decreased cognition;Decreased skin integrity;Decreased knowledge of precautions;Obesity       PT Treatment Interventions DME instruction;Therapeutic activities;Gait training;Therapeutic exercise;Patient/family education;Balance training;Functional mobility training;Neuromuscular re-education    PT Goals (Current goals can be found in the Care Plan section)  Acute Rehab PT Goals Patient Stated Goal: to get her apartment fumigated and go home PT Goal Formulation: With patient Time For Goal Achievement: 09/30/20 Potential to Achieve Goals: Fair    Frequency Min 2X/week   Barriers to discharge Decreased caregiver support      Co-evaluation               AM-PAC PT "6 Clicks" Mobility  Outcome Measure Help needed turning from your back to your side while in a flat bed without using bedrails?: A Little Help needed moving from lying on your back to sitting on the side of a flat bed without using bedrails?: A Little Help needed moving to and from a bed to a chair (including a wheelchair)?: A Lot Help needed standing up from a chair using your arms (e.g., wheelchair or bedside chair)?: A Lot Help needed to walk in hospital room?: A Lot Help needed climbing 3-5 steps with a railing? : A Lot 6 Click Score: 14    End of Session   Activity Tolerance: No increased pain;Treatment limited secondary to medical complications (Comment) (elevated HR) Patient left: in bed;with call bell/phone within reach (on stretcher in ED) Nurse Communication: Mobility status PT Visit Diagnosis: Other abnormalities of gait and mobility (R26.89);Muscle weakness (generalized)  (M62.81);Difficulty in walking, not elsewhere classified (R26.2)    Time: 2947-6546 PT Time Calculation (min) (ACUTE ONLY): 20 min   Charges:   PT Evaluation $PT Eval Moderate Complexity: 1 Mod          Farley Ly, PT, DPT  Acute Rehabilitation Services  Pager: [phone] Office: [phone]   Lehman Prom 09/16/2020, 10:28 AM

## 2020-09-17 ENCOUNTER — Emergency Department (HOSPITAL_COMMUNITY)
Admission: EM | Admit: 2020-09-17 | Discharge: 2020-09-19 | Disposition: A | Discharge status: Home / Self Care | Payer: Medicare (Managed Care) | Source: Home / Self Care | Attending: Emergency Medicine | Admitting: Emergency Medicine

## 2020-09-17 ENCOUNTER — Other Ambulatory Visit: Payer: Self-pay

## 2020-09-17 ENCOUNTER — Encounter (HOSPITAL_COMMUNITY): Payer: Self-pay

## 2020-09-17 DIAGNOSIS — B888 Other specified infestations: Secondary | ICD-10-CM | POA: Insufficient documentation

## 2020-09-17 DIAGNOSIS — L539 Erythematous condition, unspecified: Secondary | ICD-10-CM | POA: Insufficient documentation

## 2020-09-17 DIAGNOSIS — R000 Tachycardia, unspecified: Secondary | ICD-10-CM | POA: Insufficient documentation

## 2020-09-17 DIAGNOSIS — I110 Hypertensive heart disease with heart failure: Secondary | ICD-10-CM | POA: Insufficient documentation

## 2020-09-17 DIAGNOSIS — Z8616 Personal history of COVID-19: Secondary | ICD-10-CM | POA: Insufficient documentation

## 2020-09-17 DIAGNOSIS — I2510 Atherosclerotic heart disease of native coronary artery without angina pectoris: Secondary | ICD-10-CM | POA: Insufficient documentation

## 2020-09-17 DIAGNOSIS — Z79899 Other long term (current) drug therapy: Secondary | ICD-10-CM | POA: Insufficient documentation

## 2020-09-17 DIAGNOSIS — Z7901 Long term (current) use of anticoagulants: Secondary | ICD-10-CM | POA: Insufficient documentation

## 2020-09-17 DIAGNOSIS — E119 Type 2 diabetes mellitus without complications: Secondary | ICD-10-CM | POA: Insufficient documentation

## 2020-09-17 DIAGNOSIS — L989 Disorder of the skin and subcutaneous tissue, unspecified: Secondary | ICD-10-CM | POA: Insufficient documentation

## 2020-09-17 DIAGNOSIS — I5043 Acute on chronic combined systolic (congestive) and diastolic (congestive) heart failure: Secondary | ICD-10-CM | POA: Insufficient documentation
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
## 2020-09-17 NOTE — ED Notes (Addendum)
Patient refusing to leave with , states she is willing to go to SNF.  Spoke with Dr Bebe Shaggy after he reviewed chart, patient has refused all care and all options and is discharged.  Patient was given option to go home with PTAR or to go to lobby and call for a ride home.  Patient choose to go to lobby and call for a ride home.

## 2020-09-17 NOTE — ED Notes (Signed)
Pt refusing vitals signs. Attempted to explain the importance of needing vitals but pt states that the pulse ox and blood pressure cuff hurts and continues to remove it. RN made aware.

## 2020-09-17 NOTE — ED Notes (Signed)
Pt reports decontimination on 4/27 for "bad case " of bed bugs. Upon triage bed bug found on body. Captured in urine specimen cup.

## 2020-09-17 NOTE — ED Notes (Signed)
Pt refusing to keep blood pressure cuff and pulse ox on

## 2020-09-17 NOTE — ED Notes (Signed)
Pt wheeled out to lobby and educated of all options. Pt said she is going to call her neighbor for a ride. Pt parked beside phones and educated on how to call.

## 2020-09-17 NOTE — ED Triage Notes (Signed)
Pt arrives EMS from home still in the taxi that was taking her home from [facility] ER. Pt c/o diarrhea. Last episode was yesterday prior to discharge.

## 2020-09-17 NOTE — ED Notes (Signed)
Pt refusing to leave with PTAR. Informed pt of options now that she is discharged and pt requested to go to the lobby to call a ride.

## 2020-09-17 NOTE — ED Provider Notes (Signed)
Patient apparently had been refusing SNF placement and was waiting in the ED for transport home.  Once ambulance transport arrived, patient claims she would like to have SNF placement. Patient has been stable in the ED. Patient will be discharged.   Zadie Rhine, MD 09/17/20 [phone]

## 2020-09-18 ENCOUNTER — Emergency Department (HOSPITAL_COMMUNITY): Payer: Medicare (Managed Care)

## 2020-09-18 LAB — CBC WITH DIFFERENTIAL/PLATELET
Abs Immature Granulocytes: 0.04 10*3/uL (ref 0.00–0.07)
Basophils Absolute: 0.1 10*3/uL (ref 0.0–0.1)
Basophils Relative: 1 %
Eosinophils Absolute: 0.5 10*3/uL (ref 0.0–0.5)
Eosinophils Relative: 6 %
HCT: 30.9 % — ABNORMAL LOW (ref 36.0–46.0)
Hemoglobin: 9.1 g/dL — ABNORMAL LOW (ref 12.0–15.0)
Immature Granulocytes: 1 %
Lymphocytes Relative: 12 %
Lymphs Abs: 1 10*3/uL (ref 0.7–4.0)
MCH: 31.2 pg (ref 26.0–34.0)
MCHC: 29.4 g/dL — ABNORMAL LOW (ref 30.0–36.0)
MCV: 105.8 fL — ABNORMAL HIGH (ref 80.0–100.0)
Monocytes Absolute: 1 10*3/uL (ref 0.1–1.0)
Monocytes Relative: 12 %
Neutro Abs: 5.5 10*3/uL (ref 1.7–7.7)
Neutrophils Relative %: 68 %
Platelets: 159 10*3/uL (ref 150–400)
RBC: 2.92 MIL/uL — ABNORMAL LOW (ref 3.87–5.11)
RDW: 19.6 % — ABNORMAL HIGH (ref 11.5–15.5)
WBC: 8.1 10*3/uL (ref 4.0–10.5)
nRBC: 0 % (ref 0.0–0.2)

## 2020-09-18 LAB — COMPREHENSIVE METABOLIC PANEL
ALT: 10 U/L (ref 0–44)
AST: 19 U/L (ref 15–41)
Albumin: 3.6 g/dL (ref 3.5–5.0)
Alkaline Phosphatase: 60 U/L (ref 38–126)
Anion gap: 4 — ABNORMAL LOW (ref 5–15)
BUN: 22 mg/dL (ref 8–23)
CO2: 22 mmol/L (ref 22–32)
Calcium: 8.3 mg/dL — ABNORMAL LOW (ref 8.9–10.3)
Chloride: 112 mmol/L — ABNORMAL HIGH (ref 98–111)
Creatinine, Ser: 1.19 mg/dL — ABNORMAL HIGH (ref 0.44–1.00)
GFR, Estimated: 49 mL/min — ABNORMAL LOW (ref >60)
Glucose, Bld: 95 mg/dL (ref 70–99)
Potassium: 4.4 mmol/L (ref 3.5–5.1)
Sodium: 138 mmol/L (ref 135–145)
Total Bilirubin: 1 mg/dL (ref 0.3–1.2)
Total Protein: 8 g/dL (ref 6.5–8.1)

## 2020-09-18 LAB — URINALYSIS, ROUTINE W REFLEX MICROSCOPIC
Bilirubin Urine: NEGATIVE
Glucose, UA: NEGATIVE mg/dL
Hgb urine dipstick: NEGATIVE
Ketones, ur: NEGATIVE mg/dL
Nitrite: NEGATIVE
Protein, ur: NEGATIVE mg/dL
Specific Gravity, Urine: 1.013 (ref 1.005–1.030)
pH: 5 (ref 5.0–8.0)

## 2020-09-18 LAB — CBG MONITORING, ED: Glucose-Capillary: 104 mg/dL — ABNORMAL HIGH (ref 70–99)

## 2020-09-18 LAB — BRAIN NATRIURETIC PEPTIDE: B Natriuretic Peptide: 120.1 pg/mL — ABNORMAL HIGH (ref 0.0–100.0)

## 2020-09-18 LAB — TROPONIN I (HIGH SENSITIVITY)
Troponin I (High Sensitivity): 7 ng/L (ref <18)
Troponin I (High Sensitivity): 6 ng/L (ref <18)

## 2020-09-18 LAB — PROTIME-INR
INR: 1.2 (ref 0.8–1.2)
Prothrombin Time: 15.4 seconds — ABNORMAL HIGH (ref 11.4–15.2)

## 2020-09-18 MED ORDER — FERROUS SULFATE 325 (65 FE) MG PO TABS: 325.0000 mg | ORAL_TABLET | Freq: Two times a day (BID) | ORAL | Status: DC

## 2020-09-18 MED ORDER — LOPERAMIDE HCL 2 MG PO CAPS: 2.0000 mg | ORAL_CAPSULE | Freq: Four times a day (QID) | ORAL | Status: DC | PRN

## 2020-09-18 MED ORDER — METOPROLOL SUCCINATE ER 50 MG PO TB24
25.0000 mg | ORAL_TABLET | Freq: Every day | ORAL | Status: DC
  Administered 2020-09-18: 25 mg via ORAL
  Filled 2020-09-18 (×2): qty 1

## 2020-09-18 MED ORDER — INSULIN ASPART 100 UNIT/ML IJ SOLN
0.0000 [IU] | Freq: Three times a day (TID) | INTRAMUSCULAR | Status: DC
  Filled 2020-09-18: qty 0.09

## 2020-09-18 MED ORDER — ALBUTEROL SULFATE HFA 108 (90 BASE) MCG/ACT IN AERS: 2.0000 | INHALATION_SPRAY | Freq: Four times a day (QID) | RESPIRATORY_TRACT | Status: DC | PRN

## 2020-09-18 NOTE — Progress Notes (Signed)
TOC CSW spoke with [facility] Manager [phone]  to verify that apartment had been exterminated.  Pt can return to apartment.  Kaitlyn [name], MSW, LCSW-A Pronouns:  She, Her, Hers                  [facility] ED Transitions of CareClinical Social Worker [email or web address] [phone]
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
## 2020-09-18 NOTE — Discharge Instructions (Addendum)
Wash all of your clothing and bed linens in hot water to get rid of the bedbugs.  See your doctor for checkup next week.

## 2020-09-18 NOTE — ED Notes (Signed)
Breakfast tray provided. Pt resting in stretcher comfortably watching tv.

## 2020-09-18 NOTE — Evaluation (Signed)
Physical Therapy Evaluation Patient Details Name: Kaitlyn [name] MRN: [id number] DOB: 12-15-1950 Today's Date: 09/18/2020   History of Present Illness  Pt is a 70 y/o female presenting to the ED on 5/4 secondary to bed bug infestation and rash. Pt discharged on 5/3 and 5/2, back to Ed multiple times secondary to similar situation but also had diarrhea and anemia. PMH includes CHF, DM, HTN, and a fib.  Clinical Impression  Patient  Able to move to sitting and return to supine, reports buttocks very sore. Patient stood x 1 with min assistance and UE support. Patient minimally ambulates at baseline, rolls on desk chair through apartment.  patient agreeable to SNF. Pt admitted with above diagnosis.  Pt currently with functional limitations due to the deficits listed below (see PT Problem List). Pt will benefit from skilled PT to increase their independence and safety with mobility to allow discharge to the venue listed below.       Follow Up Recommendations  SNF    Equipment Recommendations    none   Recommendations for Other Services       Precautions / Restrictions Precautions Precaution Comments: h/o falls      Mobility  Bed Mobility   Bed Mobility: Supine to Sit;Sit to Supine     Supine to sit: Supervision Sit to supine: Supervision   General bed mobility comments: patient able to move legs top bed edge end back onto bed    Transfers Overall transfer level: Needs assistance Equipment used:  (back of a chair) Transfers: Sit to/from Stand Sit to Stand: Min guard         General transfer comment: pt recently pivoted to [facility], stood and partially turned to demo that she can pivot.  Ambulation/Gait                Stairs            Wheelchair Mobility    Modified Rankin (Stroke Patients Only)       Balance Overall balance assessment: Needs assistance Sitting-balance support: No upper extremity supported;Feet unsupported Sitting balance-Leahy Scale:  Fair     Standing balance support: Bilateral upper extremity supported;During functional activity Standing balance-Leahy Scale: Poor Standing balance comment: reliant on external assist                             Pertinent Vitals/Pain Faces Pain Scale: Hurts little more Pain Location: BLE and bottom Pain Descriptors / Indicators: Burning;Discomfort Pain Intervention(s): Monitored during session    Home Living Family/patient expects to be discharged to:: Private residence Living Arrangements: Alone Available Help at Discharge: Friend(s);Available PRN/intermittently Type of Home: Apartment Home Access: Level entry     Home Layout: One level Home Equipment: Walker - 2 wheels;Wheelchair - manual;Bedside commode Additional Comments: has a rolling office chair she uses to get around apt with    Prior Function Level of Independence: Independent with assistive device(s);Needs assistance   Gait / Transfers Assistance Needed: pt reports using RW for short distances, mostly transfer level with use of RW and rolling office chair  ADL's / Homemaking Assistance Needed: pt reports wearing depends, states she walks to the bathroom with her RW or uses her office chair to reach bathroom. Pt gets groceries delivered, has a barter system with neighbors where they trade food for toilet paper. Pt sponge bathes, but provides inconsistent info re: how she performs peri care as she states she can't bathe her backside.  Comments: wears diapers but typically able to make it to the bathroom, spends majority of her time in recliner, transfers to "wheelie chair" and uses that to navigate in apartment (states w/c is too large), has groceries delivered to her apartment, sponge bathes     Hand Dominance   Dominant Hand: Left    Extremity/Trunk Assessment   Upper Extremity Assessment Upper Extremity Assessment: Overall WFL for tasks assessed    Lower Extremity Assessment Lower Extremity  Assessment: Generalized weakness    Cervical / Trunk Assessment Cervical / Trunk Assessment: Kyphotic  Communication   Communication: No difficulties  Cognition Arousal/Alertness: Awake/alert Behavior During Therapy: Anxious;WFL for tasks assessed/performed Overall Cognitive Status: No family/caregiver present to determine baseline cognitive functioning                                 General Comments: Tangential throughout and required redirection to task.      General Comments      Exercises     Assessment/Plan    PT Assessment Patient needs continued PT services  PT Problem List Decreased strength;Decreased mobility;Decreased safety awareness;Decreased activity tolerance;Decreased balance;Decreased knowledge of use of DME;Decreased cognition;Decreased skin integrity;Decreased knowledge of precautions;Obesity       PT Treatment Interventions DME instruction;Therapeutic activities;Gait training;Therapeutic exercise;Patient/family education;Balance training;Functional mobility training;Neuromuscular re-education    PT Goals (Current goals can be found in the Care Plan section)  Acute Rehab PT Goals Patient Stated Goal: to get her apartment fumigated and go home PT Goal Formulation: With patient Time For Goal Achievement: 10/02/20 Potential to Achieve Goals: Fair    Frequency Min 1X/week   Barriers to discharge Decreased caregiver support uninhabitable home    Co-evaluation               AM-PAC PT "6 Clicks" Mobility  Outcome Measure Help needed turning from your back to your side while in a flat bed without using bedrails?: None Help needed moving from lying on your back to sitting on the side of a flat bed without using bedrails?: None Help needed moving to and from a bed to a chair (including a wheelchair)?: A Little Help needed standing up from a chair using your arms (e.g., wheelchair or bedside chair)?: A Lot Help needed to walk in hospital  room?: A Lot Help needed climbing 3-5 steps with a railing? : Total 6 Click Score: 16    End of Session   Activity Tolerance: Patient tolerated treatment well Patient left: in bed;with call bell/phone within reach Nurse Communication: Mobility status PT Visit Diagnosis: Other abnormalities of gait and mobility (R26.89);Muscle weakness (generalized) (M62.81);Difficulty in walking, not elsewhere classified (R26.2)    Time: 3559-7416 PT Time Calculation (min) (ACUTE ONLY): 31 min   Charges:   PT Evaluation $PT Eval Low Complexity: 1 Low PT Treatments $Therapeutic Activity: 8-22 mins       Blanchard Kelch PT Acute Rehabilitation Services Pager [phone] Office [phone]   Rada Hay 09/18/2020, 10:39 AM

## 2020-09-18 NOTE — ED Notes (Signed)
Pt set up on purewick and instructed to pee.

## 2020-09-18 NOTE — ED Notes (Signed)
Pt states "I am not even diabetic" when BGL attempted. Pt states she does not use insulin. Epic chat sent to ED MD Wents to see if insulin order can be d/c'd.

## 2020-09-18 NOTE — ED Notes (Signed)
Helped onto bedside commode.

## 2020-09-18 NOTE — Progress Notes (Signed)
.. Transition of Care [facility]) - Emergency Department Mini Assessment   Patient Details  Name: Kaitlyn [name] MRN: [id number] Date of Birth: Oct 04, 1950  [facility]) CM/SW Contact:    Tomma Ehinger C [name], LCSWA Phone Number: 09/18/2020, 2:03 PM   Clinical Narrative: TOC CSW spoke with pt in regards to going to SNF.  Pt stated she wanted to go to SNF for 2-3 days until apartment had been exterminated.  CSW explained to pt that's not how SNF works, that she would have to be released from their care.  Pt agreed to return home if apartment has been exterminated.    Kaitlyn [name], MSW, LCSW-A Pronouns:  She, Her, Hers                  [facility] Transitions of CareClinical Social Worker [email or web address] [phone]   ED Mini Assessment: What brought you to the Emergency Department? : Diarrhea  Barriers to Discharge: No Barriers Identified     Means of departure: Not know  Interventions which prevented an admission or readmission: SNF Placement    Patient Contact and Communications       Contact Date: 09/18/20,          Patient states their goals for this hospitalization and ongoing recovery are:: Pt states she wants to go to a SNF until her apartment is exterminated.   Choice offered to / list presented to : NA  Admission diagnosis:  Diahrrea  Patient Active Problem List   Diagnosis Date Noted  . Diarrhea 09/12/2020  . Infestation by bed bug 09/11/2020  . COVID-19 virus infection 06/16/2020  . SOB (shortness of breath) 06/16/2020  . Medication nonadherence due to psychosocial problem 04/18/2019  . Persistent atrial fibrillation (HCC) 04/17/2019  . Positive occult stool blood test 01/23/2019  . Advance care planning 01/04/2019  . Chronic systolic CHF (congestive heart failure) (HCC)   . Vitamin B 12 deficiency   . Neurocognitive deficits 11/01/2018  . Chronic diarrhea 10/16/2018  . Thrombocytopenia (HCC)  10/16/2018  . CAD (coronary artery disease) 10/16/2018  . Acute respiratory failure with hypoxia (HCC) 10/16/2018  . Generalized weakness 10/16/2018  . Anemia 11/23/2017  . NSTEMI (non-ST elevated myocardial infarction) (HCC) 01/09/2017  . Ischemic cardiomyopathy 01/09/2017  . Obesity, Class III, BMI 40-49.9 (morbid obesity) (HCC) 01/09/2017  . Chest pain   . Atrial fibrillation with RVR (HCC) 01/07/2017  . Anaphylaxis 12/04/2015  . Hypotension due to hypovolemia 12/01/2015  . Leukocytosis   . Type 2 diabetes mellitus with hyperglycemia, with long-term current use of insulin (HCC)   . Lactic acidosis 07/07/2015  . Dehydration 07/07/2015  . Angioedema 07/07/2015  . Allergic reaction 07/07/2015  . Acute on chronic combined systolic and diastolic heart failure (HCC) 03/24/2015  . Atrial fibrillation with rapid ventricular response (HCC)   . Diabetes mellitus, without long-term current use of insulin (HCC)   . Thyroid nodule   . Seizures (HCC)   . Hypokalemia   . Hypomagnesemia   . Lymphadenopathy 09/24/2014  . Macrocytosis   . Essential hypertension   . Psychosocial impairment   . Acute on chronic systolic CHF (congestive heart failure) (HCC) 04/27/2014  . H/O noncompliance with medical treatment, presenting hazards to health 03/23/2014  . Chronic atrial fibrillation 03/23/2014   PCP:  Kallie Locks, FNP (Inactive) Pharmacy:   [facility] - [place], [place] - [address] [place] [place] [address] Phone: [phone] Fax: [phone]  [facility] [address] [place] [address] Phone: [phone] Fax: [phone]

## 2020-09-18 NOTE — ED Provider Notes (Signed)
2:05 PM-I had a discussion with the social worker regarding this patient.  She has decided that she wants to go home, and her apartment has been decontaminated for bedbugs by extermination.  Patient is now stable for discharge.  He will be going home by safe transport.   Mancel Bale, MD 09/18/20 [phone]

## 2020-09-18 NOTE — ED Notes (Signed)
Pt states "I cant get from an uber into my apartment, I don't have my key, and I can't walk into my apartment alone". Offered PTAR transport. Pt agrees, but states she needs the maintenance to help her get into the apartment. Riquita and Alessia from [facility] made aware and will try to get in touch with apartment to arrange for pt to be let in.
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
## 2020-09-18 NOTE — Progress Notes (Signed)
TOC CM contacted [facility] apt complex emergency maintenance dept and requested call back. Pt does not have her key to get into apt. Isidoro Donning RN CCM, [facility] ED TOC CM [phone]

## 2020-09-18 NOTE — ED Notes (Signed)
Pt provided with bedside commode and instructed to provide urine sample when possible.

## 2020-09-18 NOTE — Progress Notes (Signed)
TOC CM spoke to pt and provided her driver's license and signed letter for apt leasing office provided key. TOC CM and CSW, Ricquita picked up pt's key and delivered to pt's ED RN. Isidoro Donning RN CCM, [facility] ED TOC CM [phone]

## 2020-09-18 NOTE — ED Notes (Signed)
[facility] transport called ; pt changed into burgundy scrubs; awaiting [facility] transport. Rayann Heman RN CM arranging for pts house key to be delivered to ED.

## 2020-09-18 NOTE — ED Notes (Signed)
Pt helped from stretcher to bedside commode. Ice water provided, bedside cleaning done.

## 2020-09-18 NOTE — ED Notes (Signed)
Continually ripping off EKG leads, BP cuff and SPo2 despite instruction to keep on. Pt refusing to keep on.

## 2020-09-19 ENCOUNTER — Emergency Department (HOSPITAL_COMMUNITY)
Admission: EM | Admit: 2020-09-19 | Discharge: 2020-09-19 | Disposition: A | Discharge status: Home / Self Care | Payer: Medicare (Managed Care) | Attending: Emergency Medicine | Admitting: Emergency Medicine

## 2020-09-19 ENCOUNTER — Encounter (HOSPITAL_COMMUNITY): Payer: Self-pay | Admitting: Emergency Medicine

## 2020-09-19 ENCOUNTER — Other Ambulatory Visit: Payer: Self-pay

## 2020-09-19 DIAGNOSIS — Z8616 Personal history of COVID-19: Secondary | ICD-10-CM | POA: Diagnosis not present

## 2020-09-19 DIAGNOSIS — Z7901 Long term (current) use of anticoagulants: Secondary | ICD-10-CM | POA: Insufficient documentation

## 2020-09-19 DIAGNOSIS — E119 Type 2 diabetes mellitus without complications: Secondary | ICD-10-CM | POA: Diagnosis not present

## 2020-09-19 DIAGNOSIS — I110 Hypertensive heart disease with heart failure: Secondary | ICD-10-CM | POA: Insufficient documentation

## 2020-09-19 DIAGNOSIS — Z0000 Encounter for general adult medical examination without abnormal findings: Secondary | ICD-10-CM | POA: Diagnosis not present

## 2020-09-19 DIAGNOSIS — I5043 Acute on chronic combined systolic (congestive) and diastolic (congestive) heart failure: Secondary | ICD-10-CM | POA: Insufficient documentation
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
## 2020-09-19 NOTE — ED Triage Notes (Signed)
Pt brought back to ED by [facility] after being discharged to home for diarrhea. Pt currently denies any new episodes of illness . States she doen't want to go home because she has bugs at her home. Per report by EMS the home was sprayed for insect but was not effective.

## 2020-10-01 ENCOUNTER — Inpatient Hospital Stay (HOSPITAL_COMMUNITY)
Admission: EM | Admit: 2020-10-01 | Discharge: 2020-10-15 | DRG: 872 | Disposition: A | Discharge status: Skilled Nursing Facility | Payer: Medicare (Managed Care) | Attending: Family Medicine | Admitting: Family Medicine

## 2020-10-01 ENCOUNTER — Emergency Department (HOSPITAL_COMMUNITY): Payer: Medicare (Managed Care)

## 2020-10-01 ENCOUNTER — Encounter (HOSPITAL_COMMUNITY): Payer: Self-pay

## 2020-10-01 ENCOUNTER — Other Ambulatory Visit: Payer: Self-pay

## 2020-10-01 DIAGNOSIS — L03115 Cellulitis of right lower limb: Secondary | ICD-10-CM | POA: Diagnosis not present

## 2020-10-01 DIAGNOSIS — G40909 Epilepsy, unspecified, not intractable, without status epilepticus: Secondary | ICD-10-CM | POA: Diagnosis present

## 2020-10-01 DIAGNOSIS — E538 Deficiency of other specified B group vitamins: Secondary | ICD-10-CM | POA: Diagnosis present

## 2020-10-01 DIAGNOSIS — Z9119 Patient's noncompliance with other medical treatment and regimen: Secondary | ICD-10-CM

## 2020-10-01 DIAGNOSIS — R296 Repeated falls: Secondary | ICD-10-CM | POA: Diagnosis present

## 2020-10-01 DIAGNOSIS — E871 Hypo-osmolality and hyponatremia: Secondary | ICD-10-CM | POA: Diagnosis present

## 2020-10-01 DIAGNOSIS — D539 Nutritional anemia, unspecified: Secondary | ICD-10-CM | POA: Diagnosis present

## 2020-10-01 DIAGNOSIS — I2510 Atherosclerotic heart disease of native coronary artery without angina pectoris: Secondary | ICD-10-CM | POA: Diagnosis present

## 2020-10-01 DIAGNOSIS — I4819 Other persistent atrial fibrillation: Secondary | ICD-10-CM | POA: Diagnosis present

## 2020-10-01 DIAGNOSIS — A4101 Sepsis due to Methicillin susceptible Staphylococcus aureus: Secondary | ICD-10-CM | POA: Diagnosis present

## 2020-10-01 DIAGNOSIS — F32A Depression, unspecified: Secondary | ICD-10-CM | POA: Diagnosis present

## 2020-10-01 DIAGNOSIS — L03116 Cellulitis of left lower limb: Secondary | ICD-10-CM | POA: Diagnosis not present

## 2020-10-01 DIAGNOSIS — E1122 Type 2 diabetes mellitus with diabetic chronic kidney disease: Secondary | ICD-10-CM | POA: Diagnosis present

## 2020-10-01 DIAGNOSIS — B9561 Methicillin susceptible Staphylococcus aureus infection as the cause of diseases classified elsewhere: Secondary | ICD-10-CM | POA: Diagnosis not present

## 2020-10-01 DIAGNOSIS — I130 Hypertensive heart and chronic kidney disease with heart failure and stage 1 through stage 4 chronic kidney disease, or unspecified chronic kidney disease: Secondary | ICD-10-CM | POA: Diagnosis present

## 2020-10-01 DIAGNOSIS — B888 Other specified infestations: Secondary | ICD-10-CM | POA: Diagnosis not present

## 2020-10-01 DIAGNOSIS — Z7901 Long term (current) use of anticoagulants: Secondary | ICD-10-CM

## 2020-10-01 DIAGNOSIS — R451 Restlessness and agitation: Secondary | ICD-10-CM | POA: Diagnosis present

## 2020-10-01 DIAGNOSIS — S80862A Insect bite (nonvenomous), left lower leg, initial encounter: Secondary | ICD-10-CM | POA: Diagnosis present

## 2020-10-01 DIAGNOSIS — Z993 Dependence on wheelchair: Secondary | ICD-10-CM | POA: Diagnosis not present

## 2020-10-01 DIAGNOSIS — Z20822 Contact with and (suspected) exposure to covid-19: Secondary | ICD-10-CM | POA: Diagnosis present

## 2020-10-01 DIAGNOSIS — R531 Weakness: Secondary | ICD-10-CM

## 2020-10-01 DIAGNOSIS — S80861A Insect bite (nonvenomous), right lower leg, initial encounter: Secondary | ICD-10-CM | POA: Diagnosis present

## 2020-10-01 DIAGNOSIS — Z833 Family history of diabetes mellitus: Secondary | ICD-10-CM | POA: Diagnosis not present

## 2020-10-01 DIAGNOSIS — R7881 Bacteremia: Secondary | ICD-10-CM | POA: Diagnosis not present

## 2020-10-01 DIAGNOSIS — I252 Old myocardial infarction: Secondary | ICD-10-CM

## 2020-10-01 DIAGNOSIS — K5900 Constipation, unspecified: Secondary | ICD-10-CM | POA: Diagnosis present

## 2020-10-01 DIAGNOSIS — I255 Ischemic cardiomyopathy: Secondary | ICD-10-CM | POA: Diagnosis present

## 2020-10-01 DIAGNOSIS — W57XXXD Bitten or stung by nonvenomous insect and other nonvenomous arthropods, subsequent encounter: Secondary | ICD-10-CM | POA: Diagnosis not present

## 2020-10-01 DIAGNOSIS — W57XXXA Bitten or stung by nonvenomous insect and other nonvenomous arthropods, initial encounter: Secondary | ICD-10-CM | POA: Diagnosis present

## 2020-10-01 DIAGNOSIS — I5042 Chronic combined systolic (congestive) and diastolic (congestive) heart failure: Secondary | ICD-10-CM | POA: Diagnosis not present

## 2020-10-01 DIAGNOSIS — D519 Vitamin B12 deficiency anemia, unspecified: Secondary | ICD-10-CM | POA: Diagnosis not present

## 2020-10-01 DIAGNOSIS — N1831 Chronic kidney disease, stage 3a: Secondary | ICD-10-CM | POA: Diagnosis present

## 2020-10-01 DIAGNOSIS — L0390 Cellulitis, unspecified: Secondary | ICD-10-CM

## 2020-10-01 LAB — COMPREHENSIVE METABOLIC PANEL
ALT: 10 U/L (ref 0–44)
AST: 16 U/L (ref 15–41)
Albumin: 2.5 g/dL — ABNORMAL LOW (ref 3.5–5.0)
Alkaline Phosphatase: 56 U/L (ref 38–126)
Anion gap: 6 (ref 5–15)
BUN: 19 mg/dL (ref 8–23)
CO2: 21 mmol/L — ABNORMAL LOW (ref 22–32)
Calcium: 7.5 mg/dL — ABNORMAL LOW (ref 8.9–10.3)
Chloride: 107 mmol/L (ref 98–111)
Creatinine, Ser: 1.23 mg/dL — ABNORMAL HIGH (ref 0.44–1.00)
GFR, Estimated: 47 mL/min — ABNORMAL LOW (ref >60)
Glucose, Bld: 113 mg/dL — ABNORMAL HIGH (ref 70–99)
Potassium: 4.6 mmol/L (ref 3.5–5.1)
Sodium: 134 mmol/L — ABNORMAL LOW (ref 135–145)
Total Bilirubin: 0.5 mg/dL (ref 0.3–1.2)
Total Protein: 6.9 g/dL (ref 6.5–8.1)

## 2020-10-01 LAB — CBC WITH DIFFERENTIAL/PLATELET
Abs Immature Granulocytes: 0.08 10*3/uL — ABNORMAL HIGH (ref 0.00–0.07)
Basophils Absolute: 0.1 10*3/uL (ref 0.0–0.1)
Basophils Relative: 0 %
Eosinophils Absolute: 0.2 10*3/uL (ref 0.0–0.5)
Eosinophils Relative: 1 %
HCT: 26.3 % — ABNORMAL LOW (ref 36.0–46.0)
Hemoglobin: 7.6 g/dL — ABNORMAL LOW (ref 12.0–15.0)
Immature Granulocytes: 1 %
Lymphocytes Relative: 4 %
Lymphs Abs: 0.6 10*3/uL — ABNORMAL LOW (ref 0.7–4.0)
MCH: 30.5 pg (ref 26.0–34.0)
MCHC: 28.9 g/dL — ABNORMAL LOW (ref 30.0–36.0)
MCV: 105.6 fL — ABNORMAL HIGH (ref 80.0–100.0)
Monocytes Absolute: 0.8 10*3/uL (ref 0.1–1.0)
Monocytes Relative: 5 %
Neutro Abs: 13.1 10*3/uL — ABNORMAL HIGH (ref 1.7–7.7)
Neutrophils Relative %: 89 %
Platelets: 221 10*3/uL (ref 150–400)
RBC: 2.49 MIL/uL — ABNORMAL LOW (ref 3.87–5.11)
RDW: 18.9 % — ABNORMAL HIGH (ref 11.5–15.5)
WBC: 14.8 10*3/uL — ABNORMAL HIGH (ref 4.0–10.5)
nRBC: 0 % (ref 0.0–0.2)

## 2020-10-01 LAB — LACTIC ACID, PLASMA
Lactic Acid, Venous: 2.7 mmol/L (ref 0.5–1.9)
Lactic Acid, Venous: 1.8 mmol/L (ref 0.5–1.9)

## 2020-10-01 LAB — CBC
HCT: 23.4 % — ABNORMAL LOW (ref 36.0–46.0)
Hemoglobin: 6.7 g/dL — CL (ref 12.0–15.0)
MCH: 30.6 pg (ref 26.0–34.0)
MCHC: 28.6 g/dL — ABNORMAL LOW (ref 30.0–36.0)
MCV: 106.8 fL — ABNORMAL HIGH (ref 80.0–100.0)
Platelets: 205 10*3/uL (ref 150–400)
RBC: 2.19 MIL/uL — ABNORMAL LOW (ref 3.87–5.11)
RDW: 19 % — ABNORMAL HIGH (ref 11.5–15.5)
WBC: 12.4 10*3/uL — ABNORMAL HIGH (ref 4.0–10.5)
nRBC: 0 % (ref 0.0–0.2)

## 2020-10-01 LAB — IRON AND TIBC
Iron: 32 ug/dL (ref 28–170)
Saturation Ratios: 15 % (ref 10.4–31.8)
TIBC: 219 ug/dL — ABNORMAL LOW (ref 250–450)
UIBC: 187 ug/dL

## 2020-10-01 LAB — GLUCOSE, CAPILLARY
Glucose-Capillary: 49 mg/dL — ABNORMAL LOW (ref 70–99)
Glucose-Capillary: 97 mg/dL (ref 70–99)

## 2020-10-01 LAB — BRAIN NATRIURETIC PEPTIDE: B Natriuretic Peptide: 160.1 pg/mL — ABNORMAL HIGH (ref 0.0–100.0)

## 2020-10-01 LAB — VITAMIN B12: Vitamin B-12: 86 pg/mL — ABNORMAL LOW (ref 180–914)

## 2020-10-01 LAB — CREATININE, SERUM
Creatinine, Ser: 1.16 mg/dL — ABNORMAL HIGH (ref 0.44–1.00)
GFR, Estimated: 51 mL/min — ABNORMAL LOW (ref >60)

## 2020-10-01 LAB — FERRITIN: Ferritin: 22 ng/mL (ref 11–307)

## 2020-10-01 LAB — FOLATE: Folate: 10.4 ng/mL (ref >5.9)

## 2020-10-01 MED ORDER — METOPROLOL TARTRATE 5 MG/5ML IV SOLN
5.0000 mg | Freq: Once | INTRAVENOUS | Status: AC
  Administered 2020-10-01: 5 mg via INTRAVENOUS
  Filled 2020-10-01: qty 5

## 2020-10-01 MED ORDER — INSULIN ASPART 100 UNIT/ML IJ SOLN: 0.0000 [IU] | Freq: Every day | INTRAMUSCULAR | Status: DC

## 2020-10-01 MED ORDER — ACETAMINOPHEN 650 MG RE SUPP: 650.0000 mg | Freq: Four times a day (QID) | RECTAL | Status: DC | PRN

## 2020-10-01 MED ORDER — ENOXAPARIN SODIUM 40 MG/0.4ML IJ SOSY: 40.0000 mg | PREFILLED_SYRINGE | INTRAMUSCULAR | Status: DC

## 2020-10-01 MED ORDER — ACETAMINOPHEN 325 MG PO TABS: 650.0000 mg | ORAL_TABLET | Freq: Four times a day (QID) | ORAL | Status: DC | PRN

## 2020-10-01 MED ORDER — METOPROLOL SUCCINATE ER 25 MG PO TB24
25.0000 mg | ORAL_TABLET | Freq: Every day | ORAL | Status: DC
  Administered 2020-10-01 – 2020-10-15 (×15): 25 mg via ORAL
  Filled 2020-10-01 (×16): qty 1

## 2020-10-01 MED ORDER — ENOXAPARIN SODIUM 120 MG/0.8ML IJ SOSY
1.0000 mg/kg | PREFILLED_SYRINGE | Freq: Two times a day (BID) | INTRAMUSCULAR | Status: DC
  Filled 2020-10-01: qty 0.74

## 2020-10-01 MED ORDER — INSULIN ASPART 100 UNIT/ML IJ SOLN
0.0000 [IU] | Freq: Three times a day (TID) | INTRAMUSCULAR | Status: DC
  Administered 2020-10-02: 2 [IU] via SUBCUTANEOUS

## 2020-10-01 MED ORDER — SODIUM CHLORIDE 0.9% IV SOLUTION: Freq: Once | INTRAVENOUS | Status: AC

## 2020-10-01 MED ORDER — ALBUTEROL SULFATE (2.5 MG/3ML) 0.083% IN NEBU: 2.5000 mg | INHALATION_SOLUTION | Freq: Four times a day (QID) | RESPIRATORY_TRACT | Status: DC | PRN

## 2020-10-01 MED ORDER — ONDANSETRON HCL 4 MG/2ML IJ SOLN: 4.0000 mg | Freq: Four times a day (QID) | INTRAMUSCULAR | Status: DC | PRN

## 2020-10-01 MED ORDER — VANCOMYCIN HCL 1250 MG/250ML IV SOLN: 1250.0000 mg | INTRAVENOUS | Status: DC

## 2020-10-01 MED ORDER — MORPHINE SULFATE (PF) 2 MG/ML IV SOLN
1.0000 mg | Freq: Four times a day (QID) | INTRAVENOUS | Status: DC | PRN
  Administered 2020-10-02 – 2020-10-06 (×4): 1 mg via INTRAVENOUS
  Filled 2020-10-01 (×4): qty 1

## 2020-10-01 MED ORDER — TRAMADOL HCL 50 MG PO TABS
50.0000 mg | ORAL_TABLET | Freq: Four times a day (QID) | ORAL | Status: DC | PRN
  Administered 2020-10-07 – 2020-10-15 (×12): 50 mg via ORAL
  Filled 2020-10-01 (×17): qty 1

## 2020-10-01 MED ORDER — LOPERAMIDE HCL 2 MG PO CAPS: 4.0000 mg | ORAL_CAPSULE | ORAL | Status: DC | PRN

## 2020-10-01 MED ORDER — VANCOMYCIN HCL 2000 MG/400ML IV SOLN
2000.0000 mg | Freq: Once | INTRAVENOUS | Status: AC
  Administered 2020-10-01: 2000 mg via INTRAVENOUS
  Filled 2020-10-01: qty 400

## 2020-10-01 MED ORDER — SENNOSIDES-DOCUSATE SODIUM 8.6-50 MG PO TABS: 1.0000 | ORAL_TABLET | Freq: Every evening | ORAL | Status: DC | PRN

## 2020-10-01 MED ORDER — LACTATED RINGERS IV BOLUS
1000.0000 mL | Freq: Once | INTRAVENOUS | Status: AC
  Administered 2020-10-01: 1000 mL via INTRAVENOUS

## 2020-10-01 MED ORDER — MAGNESIUM CITRATE PO SOLN
1.0000 | Freq: Once | ORAL | Status: AC | PRN
  Administered 2020-10-03: 1 via ORAL
  Filled 2020-10-01: qty 296

## 2020-10-01 MED ORDER — TRAZODONE HCL 50 MG PO TABS: 25.0000 mg | ORAL_TABLET | Freq: Every evening | ORAL | Status: DC | PRN

## 2020-10-01 MED ORDER — NYSTATIN 100000 UNIT/GM EX POWD
Freq: Once | CUTANEOUS | Status: AC
  Filled 2020-10-01: qty 15

## 2020-10-01 MED ORDER — SODIUM CHLORIDE 0.9 % IV SOLN: INTRAVENOUS | Status: DC

## 2020-10-01 MED ORDER — ONDANSETRON HCL 4 MG PO TABS: 4.0000 mg | ORAL_TABLET | Freq: Four times a day (QID) | ORAL | Status: DC | PRN

## 2020-10-01 MED ORDER — IPRATROPIUM BROMIDE 0.02 % IN SOLN: 0.5000 mg | Freq: Four times a day (QID) | RESPIRATORY_TRACT | Status: DC | PRN

## 2020-10-01 MED ORDER — BISACODYL 5 MG PO TBEC
5.0000 mg | DELAYED_RELEASE_TABLET | Freq: Every day | ORAL | Status: DC | PRN
  Administered 2020-10-02: 5 mg via ORAL
  Filled 2020-10-01: qty 1

## 2020-10-01 NOTE — ED Triage Notes (Signed)
Per EMS supervisor, patient has had a decline in physical mobility-states it is the worse bed bug infestation he has witnessed-ED staff currently bathing patient in SAPPU

## 2020-10-01 NOTE — Progress Notes (Signed)
Pharmacy Antibiotic Note  Kaitlyn [name] is a 70 y.o. female admitted on 10/01/2020 with cellulitis.  Pharmacy has been consulted for vancomycin dosing.  Assessment:  WBC elevated  SCr 1.23, CrCl 58 mL/min  Lactate 2.7  BMI 34  Plan:  Vancomycin 2000 mg IV LD followed by 1250 mg IV q24h  Monitor renal function and culture data  Goal vancomycin AUC 400-550. Check levels at steady state as needed  Height: 5\' 11"  (180.3 cm) Weight: 112 kg (246 lb 14.6 oz) IBW/kg (Calculated) : 70.8  Temp (24hrs), Avg:98.5 F (36.9 C), Min:98.5 F (36.9 C), Max:98.5 F (36.9 C)  Recent Labs  Lab 10/01/20 1516 10/01/20 1538  WBC  --  14.8*  CREATININE  --  1.23*  LATICACIDVEN 2.7*  --     Estimated Creatinine Clearance: 58.7 mL/min (A) (by C-G formula based on SCr of 1.23 mg/dL (H)).    Allergies  Allergen Reactions  . Caffeine Palpitations and Other (See Comments)    Seizure from large doses, heart races from small doses  . Cranberry Shortness Of Breath, Diarrhea, Itching and Other (See Comments)    Severe headache.."Ocean Spray cranberry juice"  . Lisinopril Diarrhea, Itching and Swelling    Site of swelling not recalled  . Penicillins Other (See Comments)    Unknown childhood allergy Has patient had a PCN reaction causing immediate rash, facial/tongue/throat swelling, SOB or lightheadedness with hypotension: unknown Has patient had a PCN reaction causing severe rash involving mucus membranes or skin necrosis: unknown Has patient had a PCN reaction that required hospitalization unknown Has patient had a PCN reaction occurring within the last 10 years: no If all of the above answers are "NO", then may proceed with Cephalosporin use.   . Cranberry Juice Concentrate [Cranberry Extract]     Headaches  . Other Other (See Comments)    Stimulants- patient has a past history of seizures    Antimicrobials this admission: vancomycin 5/18 >>   Dose adjustments this  admission:  Microbiology results: 5/18 BCx:  5/18 UCx:    Thank you for allowing pharmacy to be a part of this patient's care.  6/18, PharmD 10/01/2020 7:40 PM

## 2020-10-01 NOTE — Progress Notes (Signed)
A consult was received from an ED physician for vancomycin per pharmacy dosing.  The patient's profile has been reviewed for ht/wt/allergies/indication/available labs.   A one time order has been placed for vancomycin 2000 mg.  Further antibiotics/pharmacy consults should be ordered by admitting physician if indicated.                       Thank you, Valentina Gu 10/01/2020  5:38 PM
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
## 2020-10-01 NOTE — ED Notes (Signed)
Per EMS supervisor, Kaitlyn [name], patient's apartment should get treated for bed bugs on [address] [facility] is requesting patient be placed until apartment is treated-issue has caused an increase in county resources/costs-trying to prevent future occurrences-please contact [facility] [phone] to discuss plan of care

## 2020-10-02 ENCOUNTER — Other Ambulatory Visit: Payer: Self-pay

## 2020-10-02 DIAGNOSIS — L03115 Cellulitis of right lower limb: Secondary | ICD-10-CM | POA: Diagnosis not present

## 2020-10-02 DIAGNOSIS — B888 Other specified infestations: Secondary | ICD-10-CM | POA: Diagnosis not present

## 2020-10-02 DIAGNOSIS — I5042 Chronic combined systolic (congestive) and diastolic (congestive) heart failure: Secondary | ICD-10-CM | POA: Diagnosis not present

## 2020-10-02 DIAGNOSIS — L03116 Cellulitis of left lower limb: Secondary | ICD-10-CM | POA: Diagnosis not present

## 2020-10-02 LAB — COMPREHENSIVE METABOLIC PANEL
ALT: 11 U/L (ref 0–44)
AST: 19 U/L (ref 15–41)
Albumin: 2.2 g/dL — ABNORMAL LOW (ref 3.5–5.0)
Alkaline Phosphatase: 50 U/L (ref 38–126)
Anion gap: 2 — ABNORMAL LOW (ref 5–15)
BUN: 20 mg/dL (ref 8–23)
CO2: 22 mmol/L (ref 22–32)
Calcium: 7.4 mg/dL — ABNORMAL LOW (ref 8.9–10.3)
Chloride: 109 mmol/L (ref 98–111)
Creatinine, Ser: 1.27 mg/dL — ABNORMAL HIGH (ref 0.44–1.00)
GFR, Estimated: 45 mL/min — ABNORMAL LOW (ref >60)
Glucose, Bld: 145 mg/dL — ABNORMAL HIGH (ref 70–99)
Potassium: 4.7 mmol/L (ref 3.5–5.1)
Sodium: 133 mmol/L — ABNORMAL LOW (ref 135–145)
Total Bilirubin: 0.7 mg/dL (ref 0.3–1.2)
Total Protein: 6.2 g/dL — ABNORMAL LOW (ref 6.5–8.1)

## 2020-10-02 LAB — BLOOD CULTURE ID PANEL (REFLEXED) - BCID2

## 2020-10-02 LAB — SARS CORONAVIRUS 2 (TAT 6-24 HRS): SARS Coronavirus 2: NEGATIVE

## 2020-10-02 LAB — CBC
HCT: 22.3 % — ABNORMAL LOW (ref 36.0–46.0)
Hemoglobin: 6.4 g/dL — CL (ref 12.0–15.0)
MCH: 30.6 pg (ref 26.0–34.0)
MCHC: 28.7 g/dL — ABNORMAL LOW (ref 30.0–36.0)
MCV: 106.7 fL — ABNORMAL HIGH (ref 80.0–100.0)
Platelets: 201 10*3/uL (ref 150–400)
RBC: 2.09 MIL/uL — ABNORMAL LOW (ref 3.87–5.11)
RDW: 18.9 % — ABNORMAL HIGH (ref 11.5–15.5)
WBC: 12.5 10*3/uL — ABNORMAL HIGH (ref 4.0–10.5)
nRBC: 0 % (ref 0.0–0.2)

## 2020-10-02 LAB — PREPARE RBC (CROSSMATCH)

## 2020-10-02 LAB — HEMOGLOBIN AND HEMATOCRIT, BLOOD
HCT: 25.5 % — ABNORMAL LOW (ref 36.0–46.0)
Hemoglobin: 7.7 g/dL — ABNORMAL LOW (ref 12.0–15.0)

## 2020-10-02 LAB — LACTIC ACID, PLASMA
Lactic Acid, Venous: 2.3 mmol/L (ref 0.5–1.9)
Lactic Acid, Venous: 1.2 mmol/L (ref 0.5–1.9)

## 2020-10-02 LAB — GLUCOSE, CAPILLARY
Glucose-Capillary: 101 mg/dL — ABNORMAL HIGH (ref 70–99)
Glucose-Capillary: 118 mg/dL — ABNORMAL HIGH (ref 70–99)
Glucose-Capillary: 138 mg/dL — ABNORMAL HIGH (ref 70–99)
Glucose-Capillary: 117 mg/dL — ABNORMAL HIGH (ref 70–99)

## 2020-10-02 MED ORDER — BISACODYL 10 MG RE SUPP
10.0000 mg | Freq: Every day | RECTAL | Status: DC | PRN
  Administered 2020-10-02 – 2020-10-03 (×2): 10 mg via RECTAL
  Filled 2020-10-02 (×2): qty 1

## 2020-10-02 MED ORDER — CEFAZOLIN SODIUM-DEXTROSE 2-4 GM/100ML-% IV SOLN
2.0000 g | Freq: Three times a day (TID) | INTRAVENOUS | Status: DC
  Administered 2020-10-02 – 2020-10-15 (×38): 2 g via INTRAVENOUS
  Filled 2020-10-02 (×41): qty 100

## 2020-10-02 MED ORDER — POLYETHYLENE GLYCOL 3350 17 G PO PACK
17.0000 g | PACK | Freq: Every day | ORAL | Status: DC
  Administered 2020-10-02 – 2020-10-03 (×2): 17 g via ORAL
  Filled 2020-10-02 (×2): qty 1

## 2020-10-02 MED ORDER — HYDROCERIN EX CREA
TOPICAL_CREAM | Freq: Three times a day (TID) | CUTANEOUS | Status: DC
  Administered 2020-10-05: 1 via TOPICAL
  Filled 2020-10-02 (×2): qty 113

## 2020-10-02 MED ORDER — SENNOSIDES-DOCUSATE SODIUM 8.6-50 MG PO TABS
1.0000 | ORAL_TABLET | Freq: Two times a day (BID) | ORAL | Status: DC
  Administered 2020-10-02 – 2020-10-03 (×4): 1 via ORAL
  Filled 2020-10-02 (×4): qty 1

## 2020-10-02 MED ORDER — CYANOCOBALAMIN 1000 MCG/ML IJ SOLN
1000.0000 ug | Freq: Every day | INTRAMUSCULAR | Status: DC
  Administered 2020-10-02 – 2020-10-03 (×2): 1000 ug via INTRAMUSCULAR
  Filled 2020-10-02 (×5): qty 1

## 2020-10-02 NOTE — Progress Notes (Signed)
Inpatient Diabetes Program Recommendations  AACE/ADA: New Consensus Statement on Inpatient Glycemic Control (2015)  Target Ranges:  Prepandial:   less than 140 mg/dL      Peak postprandial:   less than 180 mg/dL (1-2 hours)      Critically ill patients:  140 - 180 mg/dL   Lab Results  Component Value Date   GLUCAP 118 (H) 10/02/2020   HGBA1C 5.2 09/11/2020    Review of Glycemic Control  Diabetes history: DM2 Outpatient Diabetes medications: None Current orders for Inpatient glycemic control: Novolog 0-15 units TID with meals and 0-5 HS  HgbA1C - 5.2% Had low of 49 mg/dL last night.  Inpatient Diabetes Program Recommendations:     Decrease Novolog to 0-9 units TID with meals.  Follow.  Thank you. Ailene Ards, RD, LDN, CDE Inpatient Diabetes Coordinator [phone]

## 2020-10-02 NOTE — Progress Notes (Signed)
PHARMACY - PHYSICIAN COMMUNICATION CRITICAL VALUE ALERT - BLOOD CULTURE IDENTIFICATION (BCID)  Kaitlyn [name] is an 70 y.o. female who presented to [facility] on 10/01/2020 with a chief complaint of cellulitis.    Assessment:  Notes indicate cellulitis, poor hygiene, bed bug infestation, skin injury (excoriations) resulting from repeated scratching. BCx: 1/4 bottles (anaerobic bottle): GPC BCID: [id number]  Name of physician (or Provider) Contacted: X.Blount  Current antibiotics:  vancomycin 5/18>>5/19 Cefazolin 5/19 >>  Changes to prescribed antibiotics recommended:  Patient is on recommended antibiotics - No changes needed  Results for orders placed or performed during the hospital encounter of 10/01/20  Blood Culture ID Panel (Reflexed) (Collected: 10/01/2020  5:45 PM)  Result Value Ref Range   Enterococcus faecalis NOT DETECTED NOT DETECTED   Enterococcus Faecium NOT DETECTED NOT DETECTED   Listeria monocytogenes NOT DETECTED NOT DETECTED   Staphylococcus species DETECTED (A) NOT DETECTED   Staphylococcus aureus (BCID) DETECTED (A) NOT DETECTED   Staphylococcus epidermidis NOT DETECTED NOT DETECTED   Staphylococcus lugdunensis NOT DETECTED NOT DETECTED   Streptococcus species NOT DETECTED NOT DETECTED   Streptococcus agalactiae NOT DETECTED NOT DETECTED   Streptococcus pneumoniae NOT DETECTED NOT DETECTED   Streptococcus pyogenes NOT DETECTED NOT DETECTED   A.calcoaceticus-baumannii NOT DETECTED NOT DETECTED   Bacteroides fragilis NOT DETECTED NOT DETECTED   Enterobacterales NOT DETECTED NOT DETECTED   Enterobacter cloacae complex NOT DETECTED NOT DETECTED   Escherichia coli NOT DETECTED NOT DETECTED   Klebsiella aerogenes NOT DETECTED NOT DETECTED   Klebsiella oxytoca NOT DETECTED NOT DETECTED   Klebsiella pneumoniae NOT DETECTED NOT DETECTED   Proteus species NOT DETECTED NOT DETECTED   Salmonella species NOT DETECTED NOT DETECTED   Serratia marcescens NOT DETECTED NOT  DETECTED   Haemophilus influenzae NOT DETECTED NOT DETECTED   Neisseria meningitidis NOT DETECTED NOT DETECTED   Pseudomonas aeruginosa NOT DETECTED NOT DETECTED   Stenotrophomonas maltophilia NOT DETECTED NOT DETECTED   Candida albicans NOT DETECTED NOT DETECTED   Candida auris NOT DETECTED NOT DETECTED   Candida glabrata NOT DETECTED NOT DETECTED   Candida krusei NOT DETECTED NOT DETECTED   Candida parapsilosis NOT DETECTED NOT DETECTED   Candida tropicalis NOT DETECTED NOT DETECTED   Cryptococcus neoformans/gattii NOT DETECTED NOT DETECTED   Meth resistant mecA/C and MREJ NOT DETECTED NOT DETECTED    Kaitlyn [name] PharmD, BCPS Clinical Pharmacist [facility] main pharmacy [phone] 10/02/2020 9:15 PM

## 2020-10-02 NOTE — Plan of Care (Signed)
Poc discussed with pt 
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
## 2020-10-02 NOTE — Progress Notes (Signed)
  Critical Lab value called in for Hemoglobin 6.7 at 2305. Doctor made aware of critical

## 2020-10-02 NOTE — Progress Notes (Signed)
Patient had delay in arrival to unit as the nurse had an emergency situation on floor.

## 2020-10-02 NOTE — Progress Notes (Signed)
Pt was bathed by this nurse and 2 techs. Pt is very difficult to turn. Wounds all cleaned with soap and water, inter-dry applied where needed. Pt has multiple areas of skin that is very thick and dry with layers of skin and old hair embedded in it as well as bed bugs. Areas were cleaned as well as possible. Pt states she has not had a bath in weeks. Fingernails noted to be very unkept with dirt under them, they were also cleaned as well as possible. Skin to pts feet very thick and dry, pt stated its from dirt. Pt states she wears socks so she doesn't have to wash her feet. Multiple bed bugs still noted on skin after bath was completed. All linen has been changed. During linen change there were bugs noted to be on sheets, blanket and pillow.  Pt also noted to be talking to herself frequently during the day. States that she does not have any family at home. Dr Hanley Ben notified of this.

## 2020-10-02 NOTE — Progress Notes (Signed)
Pt refused lovenox. I educated pt regarding the medication and why it is ordered but she still refused. Pt also refused Vit B12 education also provided about the vit. Notified Dr Hanley Ben of this.
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
## 2020-10-02 NOTE — Progress Notes (Signed)
Critical lab value called in for Lactic Acid  2.3. Normal saline running at 88ml/hr. Doctor made aware of critical value

## 2020-10-02 NOTE — Consult Note (Signed)
WOC Nurse Consult Note: Reason for Consult: Patient with active bed bug infestation assessed for linear areas of skin injury (excoriations) resulting from repeated scratching.  Poor hygiene noted overall in addition to beneath elongated finer and toenails. Pattern of distribution may be indicative of a simultaneous scabies infection, as the line of demarcation ends at the ankle and may be where the patient has had either elastic banding from slacks or elastic banding from socks.   Wound type: excoriations from scratching Pressure Injury POA: N/A Measurement: Left lateral LE at knee:  10cm x 4cm area with several linear partial and full thickness areas of skin loss.  Maximum depth is 0.3cm Right lateral LE at knee:  6cm x 3cm area with several linear partial thickness areas of skin loss with maximum depth of 0.2cm. Wound BSW:HQPR, dry with dried blood and serum (scabbing) Drainage (amount, consistency, odor) moderate serous exudate on bedding Periwound: Bilateral LEs with dry skin and flaking. Buttocks with chronic friction injury, purple discoloration from prolonged sitting.  Note: patient does not sleep in or use a bed, but rather stays in a recliner chair.  She transfers herself to a rolling toilet chair occasionally, and reports difficulty in transferring to toilet and back to rolling chair, and also from rolling chair back to recliner.  She endorsees falling several times recently and calling 911 for assistance back to her recliner. Dressing procedure/placement/frequency: The patient would benefit from an air mattress replacement with low air loss feature. Prioritization is required for resolution of the active infestation of bed bugs and also scabies, if that is required. Following that, transfer to a therapeutic sleep surface is recommended.  Patient is using DermaTherapy a low friction coefficient (COF) bed linen system with antimicrobial properties. Topical skin care plan will consist of bathing  three times daily and PRN post incontinence episodes, application of Eucerin cream for moisturized and elastic skin required for reepithelialization and turning and repositioning per house protocol.  Recommend consultation with IP to determine is scabies is present. Patient has a PurWick for urinary incontinence.  Recommend PT to evaluate patient for strength and mobility required for transfers and toileting. Consider placement for higher level of care or a change in living accommodations.  WOC nursing team will not follow, but will remain available to this patient, the nursing and medical teams.  Please re-consult if needed. Thanks, Ladona Mow, MSN, RN, GNP, [place]  Pager# [phone]

## 2020-10-02 NOTE — Progress Notes (Signed)
Pharmacy Antibiotic Note  Kaitlyn [name] is a 70 y.o. female admitted on 10/01/2020 with cellulitis.  Pharmacy has been consulted for vancomycin dosing. 5/19 plan to de-escalate to cefazolin.   Assessment:  WBC elevated  SCr 1.27, CrCl 58 mL/min  Lactate 2.3  BMI 36  Plan:  Transition to cefazolin 2 g iv q 8 hours  F/U renal function, culture results, and clinical course   Height: 5\' 11"  (180.3 cm) Weight: 118.1 kg (260 lb 5.8 oz) IBW/kg (Calculated) : 70.8  Temp (24hrs), Avg:98.2 F (36.8 C), Min:97.8 F (36.6 C), Max:98.8 F (37.1 C)  Recent Labs  Lab 10/01/20 1516 10/01/20 1538 10/01/20 1839 10/01/20 2237 10/02/20 0053  WBC  --  14.8*  --  12.4* 12.5*  CREATININE  --  1.23*  --  1.16* 1.27*  LATICACIDVEN 2.7*  --  1.8  --  2.3*    Estimated Creatinine Clearance: 58.4 mL/min (A) (by C-G formula based on SCr of 1.27 mg/dL (H)).    Allergies  Allergen Reactions  . Caffeine Palpitations and Other (See Comments)    Seizure from large doses, heart races from small doses  . Cranberry Shortness Of Breath, Diarrhea, Itching and Other (See Comments)    Severe headache.."Ocean Spray cranberry juice"  . Lisinopril Diarrhea, Itching and Swelling    Site of swelling not recalled  . Penicillins Other (See Comments)    Unknown childhood allergy Has patient had a PCN reaction causing immediate rash, facial/tongue/throat swelling, SOB or lightheadedness with hypotension: unknown Has patient had a PCN reaction causing severe rash involving mucus membranes or skin necrosis: unknown Has patient had a PCN reaction that required hospitalization unknown Has patient had a PCN reaction occurring within the last 10 years: no If all of the above answers are "NO", then may proceed with Cephalosporin use.   . Cranberry Juice Concentrate [Cranberry Extract]     Headaches  . Other Other (See Comments)    Stimulants- patient has a past history of seizures    Antimicrobials this  admission: vancomycin 5/18 >> 5/19 Cefazolin 5/19 >>   Dose adjustments this admission:  Microbiology results: 5/18 BCx:  5/18 UCx:    Thank you for allowing pharmacy to be a part of this patient's care.  6/18 D, PharmD 10/02/2020 10:00 AM

## 2020-10-03 DIAGNOSIS — L03115 Cellulitis of right lower limb: Secondary | ICD-10-CM | POA: Diagnosis not present

## 2020-10-03 DIAGNOSIS — W57XXXA Bitten or stung by nonvenomous insect and other nonvenomous arthropods, initial encounter: Secondary | ICD-10-CM

## 2020-10-03 DIAGNOSIS — L03116 Cellulitis of left lower limb: Secondary | ICD-10-CM

## 2020-10-03 LAB — CBC WITH DIFFERENTIAL/PLATELET
Abs Immature Granulocytes: 0.08 10*3/uL — ABNORMAL HIGH (ref 0.00–0.07)
Basophils Absolute: 0.1 10*3/uL (ref 0.0–0.1)
Basophils Relative: 1 %
Eosinophils Absolute: 0.6 10*3/uL — ABNORMAL HIGH (ref 0.0–0.5)
Eosinophils Relative: 7 %
HCT: 24.6 % — ABNORMAL LOW (ref 36.0–46.0)
Hemoglobin: 7.5 g/dL — ABNORMAL LOW (ref 12.0–15.0)
Immature Granulocytes: 1 %
Lymphocytes Relative: 11 %
Lymphs Abs: 0.9 10*3/uL (ref 0.7–4.0)
MCH: 30.7 pg (ref 26.0–34.0)
MCHC: 30.5 g/dL (ref 30.0–36.0)
MCV: 100.8 fL — ABNORMAL HIGH (ref 80.0–100.0)
Monocytes Absolute: 0.7 10*3/uL (ref 0.1–1.0)
Monocytes Relative: 8 %
Neutro Abs: 6 10*3/uL (ref 1.7–7.7)
Neutrophils Relative %: 72 %
Platelets: 170 10*3/uL (ref 150–400)
RBC: 2.44 MIL/uL — ABNORMAL LOW (ref 3.87–5.11)
RDW: 18.8 % — ABNORMAL HIGH (ref 11.5–15.5)
WBC: 8.3 10*3/uL (ref 4.0–10.5)
nRBC: 0 % (ref 0.0–0.2)

## 2020-10-03 LAB — COMPREHENSIVE METABOLIC PANEL
ALT: 10 U/L (ref 0–44)
AST: 19 U/L (ref 15–41)
Albumin: 2.2 g/dL — ABNORMAL LOW (ref 3.5–5.0)
Alkaline Phosphatase: 50 U/L (ref 38–126)
Anion gap: 4 — ABNORMAL LOW (ref 5–15)
BUN: 20 mg/dL (ref 8–23)
CO2: 20 mmol/L — ABNORMAL LOW (ref 22–32)
Calcium: 7.1 mg/dL — ABNORMAL LOW (ref 8.9–10.3)
Chloride: 110 mmol/L (ref 98–111)
Creatinine, Ser: 1.23 mg/dL — ABNORMAL HIGH (ref 0.44–1.00)
GFR, Estimated: 47 mL/min — ABNORMAL LOW (ref >60)
Glucose, Bld: 93 mg/dL (ref 70–99)
Potassium: 4.2 mmol/L (ref 3.5–5.1)
Sodium: 134 mmol/L — ABNORMAL LOW (ref 135–145)
Total Bilirubin: 0.6 mg/dL (ref 0.3–1.2)
Total Protein: 6.3 g/dL — ABNORMAL LOW (ref 6.5–8.1)

## 2020-10-03 LAB — GLUCOSE, CAPILLARY
Glucose-Capillary: 91 mg/dL (ref 70–99)
Glucose-Capillary: 125 mg/dL — ABNORMAL HIGH (ref 70–99)
Glucose-Capillary: 103 mg/dL — ABNORMAL HIGH (ref 70–99)

## 2020-10-03 LAB — MAGNESIUM: Magnesium: 2.1 mg/dL (ref 1.7–2.4)

## 2020-10-03 MED ORDER — ENOXAPARIN SODIUM 60 MG/0.6ML IJ SOSY
60.0000 mg | PREFILLED_SYRINGE | INTRAMUSCULAR | Status: DC
  Filled 2020-10-03 (×3): qty 0.6

## 2020-10-03 NOTE — Evaluation (Signed)
Physical Therapy Evaluation Patient Details Name: Kaitlyn [name] MRN: [id number] DOB: 1951-05-04 Today's Date: 10/03/2020   History of Present Illness  Pt is 70 yo female admitted for bil lower extremity cellulitis and started on IV vancomycin now IV Ancef.  This is 4th admission since 09/11/20 -due to bed bugs, falls, weakness, cellulitis. Pt with PMH of CAD, MI, cardiomyopathy, CHF, HTN, afib, DM2, seizure, and ambulatory dysfunction.  Clinical Impression   Pt admitted with above diagnosis. Pt has had several recent admission due to falls, weakness, bed bug infestation.  She is largely non ambulatory at baseline but can typically stand pivot to a w/c or rolling desk chair without assist. Today, evaluation was limited due to pt pre-occupied by constipation, plan for enema at 14:00 (after her tv show), and pain in abdomen with movement.  Tried to encourage/educate that mobility can assist in return bowel function/decreasing constipation, but pt does not want to try anything until after enema.  Did agree to bed mobility and strength testing.  Pt does appear to have decrease in mobility and strength from baseline with recent hx of falls.  Will benefit from PT to progress. Pt currently with functional limitations due to the deficits listed below (see PT Problem List). Pt will benefit from skilled PT to increase their independence and safety with mobility to allow discharge to the venue listed below.       Follow Up Recommendations SNF    Equipment Recommendations  None recommended by PT (has w/c and RW)    Recommendations for Other Services       Precautions / Restrictions Precautions Precautions: Fall Precaution Comments: h/o falls and bed bugs Restrictions Weight Bearing Restrictions: No      Mobility  Bed Mobility Overal bed mobility: Needs Assistance             General bed mobility comments: Supine to long sit: pt able to pull self up using bed rails from [facility] elevated  position.  Pt able to slide each leg to EOB.  Declined sitting EOB due to does not want to increase abdominal pain from constipation. Based on clinical judgment could transfer supine to sit with likely min A based on portions of transfer she demonstrated    Transfers Overall transfer level: Needs assistance               General transfer comment: refused  Ambulation/Gait                Stairs            Wheelchair Mobility    Modified Rankin (Stroke Patients Only)       Balance Overall balance assessment: History of Falls     Sitting balance - Comments: refused       Standing balance comment: refused                             Pertinent Vitals/Pain Pain Assessment: Faces Faces Pain Scale: No hurt Pain Location: rectum from needing to have BM - hurts with movement or eating/drinking Pain Descriptors / Indicators: Discomfort Pain Intervention(s): Monitored during session;Limited activity within patient's tolerance    Home Living Family/patient expects to be discharged to:: Skilled nursing facility Living Arrangements: Alone Available Help at Discharge: Friend(s);Available PRN/intermittently Type of Home: Apartment Home Access: Level entry     Home Layout: One level Home Equipment: Walker - 2 wheels;Wheelchair - manual;Bedside commode Additional Comments: has a rolling office  chair she uses to get around apt with    Prior Function Level of Independence: Independent with assistive device(s);Needs assistance   Gait / Transfers Assistance Needed: Pt reports only stand and pivots at baseline.  Asked if she walked like 5-10' but she denied (prior admission said she walked some).  Uses a w/c but it is too big for apartment so rolls in a rolling office chair. Has had multiple falls recently  ADL's / Homemaking Assistance Needed: pt reports wearing depends, states she walks to the bathroom with her RW or uses her office chair to reach bathroom.  Pt gets groceries delivered, has a barter system with neighbors where they trade food for toilet paper. Pt sponge bathes, but provides inconsistent info re: how she performs peri care as she states she can't bathe her backside.        Hand Dominance   Dominant Hand: Left    Extremity/Trunk Assessment   Upper Extremity Assessment Upper Extremity Assessment:  (ROM : WNL; MMT: 4/5)    Lower Extremity Assessment Lower Extremity Assessment: LLE deficits/detail;RLE deficits/detail RLE Deficits / Details: ROM : WFL; MMT: ankle and knee 3/5, hip 2/5; limited by pain and pt's willingness to participate LLE Deficits / Details: ROM : WFL; MMT: ankle and knee 3/5, hip 2/5; limited by pain and pt's willingness to participate    Cervical / Trunk Assessment Cervical / Trunk Assessment: Kyphotic  Communication   Communication: No difficulties  Cognition Arousal/Alertness: Awake/alert Behavior During Therapy: Anxious Overall Cognitive Status: No family/caregiver present to determine baseline cognitive functioning                                 General Comments: Pt very preoccupied by constipation and needing to have BM - states given laxative and to have enema (but only after she watches her tv show), declined mobility despite encouragement that could assist with bowel function.  Pt also very tangential and needs redirecting.  Likely baseline cognition.      General Comments General comments (skin integrity, edema, etc.): Bil LE and back: scabs, red, scaling, dry    Exercises     Assessment/Plan    PT Assessment Patient needs continued PT services  PT Problem List Decreased strength;Decreased mobility;Decreased safety awareness;Decreased activity tolerance;Decreased balance;Decreased knowledge of use of DME;Decreased cognition;Decreased skin integrity;Decreased knowledge of precautions;Obesity;Pain       PT Treatment Interventions DME instruction;Therapeutic  activities;Gait training;Therapeutic exercise;Patient/family education;Balance training;Functional mobility training;Neuromuscular re-education    PT Goals (Current goals can be found in the Care Plan section)  Acute Rehab PT Goals Patient Stated Goal: agreeable to placement at this time PT Goal Formulation: With patient Time For Goal Achievement: 10/17/20 Potential to Achieve Goals: Fair    Frequency Min 2X/week   Barriers to discharge Decreased caregiver support;Inaccessible home environment      Co-evaluation               AM-PAC PT "6 Clicks" Mobility  Outcome Measure Help needed turning from your back to your side while in a flat bed without using bedrails?: A Little Help needed moving from lying on your back to sitting on the side of a flat bed without using bedrails?: A Little Help needed moving to and from a bed to a chair (including a wheelchair)?: A Lot Help needed standing up from a chair using your arms (e.g., wheelchair or bedside chair)?: A Lot Help needed to walk in  hospital room?: Total Help needed climbing 3-5 steps with a railing? : Total 6 Click Score: 12    End of Session   Activity Tolerance: Other (comment) (self limiting) Patient left: in bed;with call bell/phone within reach;with bed alarm set Nurse Communication: Mobility status PT Visit Diagnosis: Muscle weakness (generalized) (M62.81);Difficulty in walking, not elsewhere classified (R26.2);History of falling (Z91.81)    Time: 4270-6237 PT Time Calculation (min) (ACUTE ONLY): 20 min   Charges:   PT Evaluation $PT Eval Low Complexity: 1 Low          Cristin Szatkowski, PT Acute Rehab Services Pager [phone] [facility] [phone]    Rayetta Humphrey 10/03/2020, 11:43 AM

## 2020-10-03 NOTE — Progress Notes (Signed)
Pt continues to refuse to be bathed. Pt states that she is not going to take a bath until she has a BM. Magnesium Citrate administered per order. Informed the pt of the importance of bathing, but pt continues to get very agitated and refuses.

## 2020-10-03 NOTE — Progress Notes (Signed)
Patient is refusing to take a shower despite several attempts by staff and charge nurse. Patient deflects when educated on policy. She threw beg bugs at staff and stated, "Oh c'mon, they are just bed bugs and they're better on you than on my legs."

## 2020-10-03 NOTE — Progress Notes (Signed)
Signed        Staff attempted to clean and change patient. Patient became very agitated yelling out that she did not want a bath because she had already had one today. Staff explained to patient that baths were ordered for her plan of care. Patient continued to yell and cuss at staff. Upon staff attempting to clean her patient began picking at her skin and throwing bed bugs at staff. Staff left the room until patient is able to have a calm conversation.

## 2020-10-03 NOTE — ED Notes (Signed)
Staff attempted to clean and change patient. Patient became very agitated yelling out that she did not want a bath because she had already had one today. Staff explained to patient that baths were ordered for her plan of care. Patient continued to yell and cuss at staff. Upon staff attempting to clean her patient began picking at her skin and throwing bed bugs at staff. Staff left the room until patient is able to have a calm conversation.
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
## 2020-10-03 NOTE — NC FL2 (Signed)
[place] MEDICAID FL2 LEVEL OF CARE SCREENING TOOL     IDENTIFICATION  Patient Name: Kaitlyn [name] Birthdate: [birth year] Sex: female Admission Date (Current Location): 10/01/2020  [facility] and [place] Number:  Producer, television/film/video and Address:  [facility],  [address] [place] [address]      Provider Number: [id number]  Attending Physician Name and Address:  Glade Lloyd, MD  Relative Name and Phone Number:  Quin Hoop (Relative)   [phone]    Current Level of Care: Hospital Recommended Level of Care: Skilled Nursing Facility Prior Approval Number:    Date Approved/Denied:   PASRR Number: [id number]  Discharge Plan: SNF    Current Diagnoses: Patient Active Problem List   Diagnosis Date Noted  . Bilateral lower leg cellulitis 10/01/2020  . Diarrhea 09/12/2020  . Infestation by bed bug 09/11/2020  . COVID-19 virus infection 06/16/2020  . SOB (shortness of breath) 06/16/2020  . Medication nonadherence due to psychosocial problem 04/18/2019  . Persistent atrial fibrillation (HCC) 04/17/2019  . Positive occult stool blood test 01/23/2019  . Advance care planning 01/04/2019  . Chronic systolic CHF (congestive heart failure) (HCC)   . Vitamin B 12 deficiency   . Neurocognitive deficits 11/01/2018  . Chronic diarrhea 10/16/2018  . Thrombocytopenia (HCC) 10/16/2018  . CAD (coronary artery disease) 10/16/2018  . Acute respiratory failure with hypoxia (HCC) 10/16/2018  . Generalized weakness 10/16/2018  . Anemia 11/23/2017  . NSTEMI (non-ST elevated myocardial infarction) (HCC) 01/09/2017  . Ischemic cardiomyopathy 01/09/2017  . Obesity, Class III, BMI 40-49.9 (morbid obesity) (HCC) 01/09/2017  . Chest pain   . Atrial fibrillation with RVR (HCC) 01/07/2017  . Anaphylaxis 12/04/2015  . Hypotension due to hypovolemia 12/01/2015  . Leukocytosis   . Type 2 diabetes mellitus with hyperglycemia, with long-term current use of insulin (HCC)   .  Lactic acidosis 07/07/2015  . Dehydration 07/07/2015  . Angioedema 07/07/2015  . Allergic reaction 07/07/2015  . Acute on chronic combined systolic and diastolic heart failure (HCC) 03/24/2015  . Atrial fibrillation with rapid ventricular response (HCC)   . Diabetes mellitus, without long-term current use of insulin (HCC)   . Thyroid nodule   . Seizures (HCC)   . Hypokalemia   . Hypomagnesemia   . Lymphadenopathy 09/24/2014  . Macrocytosis   . Essential hypertension   . Psychosocial impairment   . Acute on chronic systolic CHF (congestive heart failure) (HCC) 04/27/2014  . H/O noncompliance with medical treatment, presenting hazards to health 03/23/2014  . Chronic atrial fibrillation 03/23/2014    Orientation RESPIRATION BLADDER Height & Weight     Self,Time,Situation,Place  Normal Incontinent Weight: 118.4 kg Height:  5\' 11"  (180.3 cm)  BEHAVIORAL SYMPTOMS/MOOD NEUROLOGICAL BOWEL NUTRITION STATUS      Incontinent Diet (see d/c summary)  AMBULATORY STATUS COMMUNICATION OF NEEDS Skin   Extensive Assist Verbally Other (Comment) (Bilateral knee excoriation from scratching.  Buttocks with chronic friction injury, purple discoloration from prolonged sitting)                       Personal Care Assistance Level of Assistance  Bathing,Feeding,Dressing Bathing Assistance: Maximum assistance Feeding assistance: Independent Dressing Assistance: Limited assistance     Functional Limitations Info  Sight,Hearing,Speech Sight Info: Adequate Hearing Info: Adequate Speech Info: Adequate    SPECIAL CARE FACTORS FREQUENCY  PT (By licensed PT),OT (By licensed OT)     PT Frequency: 5X/W OT Frequency: 5X/W  Contractures Contractures Info: Not present    Additional Factors Info  Code Status,Allergies Code Status Info: full Allergies Info: caffeine, cranberry, lisinopril, penicillins, stimulants           Current Medications (10/03/2020):  This is the current  hospital active medication list Current Facility-Administered Medications  Medication Dose Route Frequency Provider Last Rate Last Admin  . acetaminophen (TYLENOL) tablet 650 mg  650 mg Oral Q6H PRN Hugelmeyer, Alexis, DO       Or  . acetaminophen (TYLENOL) suppository 650 mg  650 mg Rectal Q6H PRN Hugelmeyer, Alexis, DO      . albuterol (PROVENTIL) (2.5 MG/3ML) 0.083% nebulizer solution 2.5 mg  2.5 mg Nebulization Q6H PRN Hugelmeyer, Alexis, DO      . bisacodyl (DULCOLAX) suppository 10 mg  10 mg Rectal Daily PRN Glade Lloyd, MD   10 mg at 10/02/20 1204  . ceFAZolin (ANCEF) IVPB 2g/100 mL premix  2 g Intravenous Q8H Alekh, Kshitiz, MD 200 mL/hr at 10/03/20 1028 2 g at 10/03/20 1028  . cyanocobalamin ((VITAMIN B-12)) injection 1,000 mcg  1,000 mcg Intramuscular Daily Glade Lloyd, MD   1,000 mcg at 10/03/20 1029  . hydrocerin (EUCERIN) cream   Topical TID Glade Lloyd, MD   Given at 10/03/20 1029  . insulin aspart (novoLOG) injection 0-15 Units  0-15 Units Subcutaneous TID WC Hugelmeyer, Alexis, DO   2 Units at 10/02/20 1743  . insulin aspart (novoLOG) injection 0-5 Units  0-5 Units Subcutaneous QHS Hugelmeyer, Alexis, DO      . ipratropium (ATROVENT) nebulizer solution 0.5 mg  0.5 mg Nebulization Q6H PRN Hugelmeyer, Alexis, DO      . metoprolol succinate (TOPROL-XL) 24 hr tablet 25 mg  25 mg Oral Daily Little, Ambrose Finland, MD   25 mg at 10/03/20 1029  . morphine 2 MG/ML injection 1 mg  1 mg Intravenous Q6H PRN Hugelmeyer, Alexis, DO   1 mg at 10/02/20 1742  . ondansetron (ZOFRAN) tablet 4 mg  4 mg Oral Q6H PRN Hugelmeyer, Alexis, DO       Or  . ondansetron (ZOFRAN) injection 4 mg  4 mg Intravenous Q6H PRN Hugelmeyer, Alexis, DO      . polyethylene glycol (MIRALAX / GLYCOLAX) packet 17 g  17 g Oral Daily Alekh, Kshitiz, MD   17 g at 10/03/20 1030  . senna-docusate (Senokot-S) tablet 1 tablet  1 tablet Oral BID Glade Lloyd, MD   1 tablet at 10/03/20 1029  . traMADol (ULTRAM) tablet 50  mg  50 mg Oral Q6H PRN Hugelmeyer, Alexis, DO      . traZODone (DESYREL) tablet 25 mg  25 mg Oral QHS PRN Hugelmeyer, Alexis, DO         Discharge Medications: Please see discharge summary for a list of discharge medications.  Relevant Imaging Results:  Relevant Lab Results:   Additional Information SSN [id number] [place], [place]
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
## 2020-10-03 NOTE — Care Management Important Message (Signed)
Important Message  Patient Details IM Letter given to the Patient. Name: Kaitlyn [name] MRN: [id number] Date of Birth: [birth year]   Medicare Important Message Given:  Yes     Caren Macadam 10/03/2020, 9:09 AM
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
## 2020-10-03 NOTE — TOC Progression Note (Addendum)
Transition of Care [facility]) - Progression Note    Patient Details  Name: Kaitlyn [name] MRN: [id number] Date of Birth: [birth year]  Transition of [facility]) CM/SW Contact  Ida Rogue, [place] Phone Number: 10/03/2020, 1:07 PM  Clinical Narrative:   Patient seen in follow up to PT recommendation of SNF.  She explains that she has "worked something out with DSS" whereby they take care of her belongings while she gets some help at rehab.  She was last at [facility] in June of 21 at [place]. Bed search initiated. TOC will continue to follow during the course of hospitalization.  If patient goes home rather than SNF, will need to get key from Josephina Gip, Dealer at [employer]  [phone]     Expected Discharge Plan: Skilled Nursing Facility Barriers to Discharge: SNF Pending bed offer  Expected Discharge Plan and Services Expected Discharge Plan: Skilled Nursing Facility In-house Referral: Clinical Social Work     Living arrangements for the past 2 months: Apartment                                       Social Determinants of Health (SDOH) Interventions    Readmission Risk Interventions Readmission Risk Prevention Plan 10/17/2018  Transportation Screening Complete  PCP or Specialist Appt within 3-5 Days Not Complete  Not Complete comments plan to d/c to SNF  HRI or Home Care Consult Complete  Social Work Consult for Recovery Care Planning/Counseling Complete  Palliative Care Screening Not Applicable  Medication Review Oceanographer) Complete  Some recent data might be hidden

## 2020-10-04 ENCOUNTER — Inpatient Hospital Stay (HOSPITAL_COMMUNITY): Payer: Medicare (Managed Care)

## 2020-10-04 DIAGNOSIS — L03116 Cellulitis of left lower limb: Secondary | ICD-10-CM | POA: Diagnosis not present

## 2020-10-04 DIAGNOSIS — R7881 Bacteremia: Secondary | ICD-10-CM

## 2020-10-04 DIAGNOSIS — D519 Vitamin B12 deficiency anemia, unspecified: Secondary | ICD-10-CM

## 2020-10-04 DIAGNOSIS — B9561 Methicillin susceptible Staphylococcus aureus infection as the cause of diseases classified elsewhere: Secondary | ICD-10-CM

## 2020-10-04 DIAGNOSIS — B888 Other specified infestations: Secondary | ICD-10-CM | POA: Diagnosis not present

## 2020-10-04 LAB — CULTURE, BLOOD (ROUTINE X 2): Special Requests: ADEQUATE

## 2020-10-04 LAB — GLUCOSE, CAPILLARY
Glucose-Capillary: 101 mg/dL — ABNORMAL HIGH (ref 70–99)
Glucose-Capillary: 111 mg/dL — ABNORMAL HIGH (ref 70–99)
Glucose-Capillary: 89 mg/dL (ref 70–99)
Glucose-Capillary: 92 mg/dL (ref 70–99)
Glucose-Capillary: 99 mg/dL (ref 70–99)

## 2020-10-04 LAB — ECHOCARDIOGRAM COMPLETE
Height: 71 in
S' Lateral: 4.8 cm
Weight: 4155.23 oz

## 2020-10-04 MED ORDER — LOPERAMIDE HCL 2 MG PO CAPS
2.0000 mg | ORAL_CAPSULE | ORAL | Status: DC | PRN
  Administered 2020-10-05 – 2020-10-15 (×8): 2 mg via ORAL
  Filled 2020-10-04 (×11): qty 1
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
## 2020-10-04 NOTE — Progress Notes (Signed)
  Echocardiogram 2D Echocardiogram has been performed.  Augustine Radar 10/04/2020, 3:21 PM

## 2020-10-05 DIAGNOSIS — B888 Other specified infestations: Secondary | ICD-10-CM | POA: Diagnosis not present

## 2020-10-05 DIAGNOSIS — L03116 Cellulitis of left lower limb: Secondary | ICD-10-CM | POA: Diagnosis not present

## 2020-10-05 DIAGNOSIS — L03115 Cellulitis of right lower limb: Secondary | ICD-10-CM | POA: Diagnosis not present

## 2020-10-05 DIAGNOSIS — R7881 Bacteremia: Secondary | ICD-10-CM | POA: Diagnosis not present

## 2020-10-05 LAB — CBC WITH DIFFERENTIAL/PLATELET
Abs Immature Granulocytes: 0.04 10*3/uL (ref 0.00–0.07)
Basophils Absolute: 0.1 10*3/uL (ref 0.0–0.1)
Basophils Relative: 1 %
Eosinophils Absolute: 0.3 10*3/uL (ref 0.0–0.5)
Eosinophils Relative: 7 %
HCT: 26.4 % — ABNORMAL LOW (ref 36.0–46.0)
Hemoglobin: 7.9 g/dL — ABNORMAL LOW (ref 12.0–15.0)
Immature Granulocytes: 1 %
Lymphocytes Relative: 18 %
Lymphs Abs: 0.9 10*3/uL (ref 0.7–4.0)
MCH: 31.1 pg (ref 26.0–34.0)
MCHC: 29.9 g/dL — ABNORMAL LOW (ref 30.0–36.0)
MCV: 103.9 fL — ABNORMAL HIGH (ref 80.0–100.0)
Monocytes Absolute: 0.5 10*3/uL (ref 0.1–1.0)
Monocytes Relative: 9 %
Neutro Abs: 3.3 10*3/uL (ref 1.7–7.7)
Neutrophils Relative %: 64 %
Platelets: 176 10*3/uL (ref 150–400)
RBC: 2.54 MIL/uL — ABNORMAL LOW (ref 3.87–5.11)
RDW: 18.4 % — ABNORMAL HIGH (ref 11.5–15.5)
WBC: 5.1 10*3/uL (ref 4.0–10.5)
nRBC: 0 % (ref 0.0–0.2)

## 2020-10-05 LAB — GLUCOSE, CAPILLARY
Glucose-Capillary: 123 mg/dL — ABNORMAL HIGH (ref 70–99)
Glucose-Capillary: 102 mg/dL — ABNORMAL HIGH (ref 70–99)
Glucose-Capillary: 118 mg/dL — ABNORMAL HIGH (ref 70–99)
Glucose-Capillary: 120 mg/dL — ABNORMAL HIGH (ref 70–99)

## 2020-10-05 LAB — BASIC METABOLIC PANEL
Anion gap: 5 (ref 5–15)
BUN: 14 mg/dL (ref 8–23)
CO2: 22 mmol/L (ref 22–32)
Calcium: 7.5 mg/dL — ABNORMAL LOW (ref 8.9–10.3)
Chloride: 108 mmol/L (ref 98–111)
Creatinine, Ser: 1.13 mg/dL — ABNORMAL HIGH (ref 0.44–1.00)
GFR, Estimated: 52 mL/min — ABNORMAL LOW (ref >60)
Glucose, Bld: 84 mg/dL (ref 70–99)
Potassium: 4.6 mmol/L (ref 3.5–5.1)
Sodium: 135 mmol/L (ref 135–145)

## 2020-10-05 LAB — MAGNESIUM: Magnesium: 2.6 mg/dL — ABNORMAL HIGH (ref 1.7–2.4)

## 2020-10-05 NOTE — Progress Notes (Signed)
This RN entered room to give patient her morning meds.  Upon entering room patient began to yell at this RN demanding water and immodium because "the doctor just told me I could have immodium".  Patient was educated on the difference between prn and scheduled meds with a response of "you're an idiot and the doctor told me I could have it".  Patient again told she could have it but needed to ask for it.  Patient continued to yell at this RN.  Patient was asked to not yell at me since I was there to help her, to which she replied "I can yell at anyone I want".  This RN then informed her that I was leaving the room and would not be back until she could speak to me without yelling.  Patient continued to yell as I exited the room and closed the door.

## 2020-10-05 NOTE — Plan of Care (Signed)
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
## 2020-10-05 NOTE — Progress Notes (Signed)
Pharmacy Antibiotic Note  Kaitlyn [name] is a 70 y.o. female admitted on 10/01/2020 with cellulitis.  Patient is currently on cefazolin (pharmacy to dose) for MSSA bacteremia, being followed by ID.   Today, 10/05/20  WBC now WNL  SCr 1.23 on 5/20. CrCl 60 mL/min  ECHO 5/21: No evidence vegetation Today is day #5 IV antibiotics  Plan:  Continue cefazolin 2 g IV q8h  Check SCr with AM labs tomorrow to assess renal function  Follow for ID recommendations  Height: 5\' 11"  (180.3 cm) Weight: 117.8 kg (259 lb 11.2 oz) IBW/kg (Calculated) : 70.8  Temp (24hrs), Avg:99.2 F (37.3 C), Min:98.6 F (37 C), Max:99.8 F (37.7 C)  Recent Labs  Lab 10/01/20 1516 10/01/20 1538 10/01/20 1839 10/01/20 2237 10/02/20 0053 10/02/20 1102 10/03/20 0442 10/05/20 0902  WBC  --  14.8*  --  12.4* 12.5*  --  8.3 5.1  CREATININE  --  1.23*  --  1.16* 1.27*  --  1.23*  --   LATICACIDVEN 2.7*  --  1.8  --  2.3* 1.2  --   --     Estimated Creatinine Clearance: 60.2 mL/min (A) (by C-G formula based on SCr of 1.23 mg/dL (H)).    Allergies  Allergen Reactions  . Caffeine Palpitations and Other (See Comments)    Seizure from large doses, heart races from small doses  . Cranberry Shortness Of Breath, Diarrhea, Itching and Other (See Comments)    Severe headache.."Ocean Spray cranberry juice"  . Lisinopril Diarrhea, Itching and Swelling    Site of swelling not recalled  . Penicillins Other (See Comments)    Unknown childhood allergy Has patient had a PCN reaction causing immediate rash, facial/tongue/throat swelling, SOB or lightheadedness with hypotension: unknown Has patient had a PCN reaction causing severe rash involving mucus membranes or skin necrosis: unknown Has patient had a PCN reaction that required hospitalization unknown Has patient had a PCN reaction occurring within the last 10 years: no If all of the above answers are "NO", then may proceed with Cephalosporin use.   . Cranberry  Juice Concentrate [Cranberry Extract]     Headaches  . Other Other (See Comments)    Stimulants- patient has a past history of seizures    Antimicrobials this admission: vancomycin 5/18 >> 5/19 Cefazolin 5/19 >>   Dose adjustments this admission:  Microbiology results: 5/18 BCx: MSSA 5/21 repeat BCx: ngtd 5/18 UCx:    Thank you for allowing pharmacy to be a part of this patient's care.  6/18, PharmD 10/05/2020 9:55 AM

## 2020-10-06 DIAGNOSIS — R7881 Bacteremia: Secondary | ICD-10-CM | POA: Diagnosis not present

## 2020-10-06 DIAGNOSIS — L03116 Cellulitis of left lower limb: Secondary | ICD-10-CM | POA: Diagnosis not present

## 2020-10-06 DIAGNOSIS — B888 Other specified infestations: Secondary | ICD-10-CM | POA: Diagnosis not present

## 2020-10-06 DIAGNOSIS — L03115 Cellulitis of right lower limb: Secondary | ICD-10-CM | POA: Diagnosis not present

## 2020-10-06 LAB — TYPE AND SCREEN
ABO/RH(D): A POS
Antibody Screen: NEGATIVE
Unit division: 0
Unit division: 0
Unit division: 0

## 2020-10-06 LAB — BPAM RBC
Blood Product Expiration Date: 202206062359
Blood Product Expiration Date: 202206062359
Blood Product Expiration Date: 202206072359
ISSUE DATE / TIME: 202205190251
ISSUE DATE / TIME: 202205190555
Unit Type and Rh: 6200
Unit Type and Rh: 6200
Unit Type and Rh: 6200

## 2020-10-06 LAB — GLUCOSE, CAPILLARY
Glucose-Capillary: 110 mg/dL — ABNORMAL HIGH (ref 70–99)
Glucose-Capillary: 126 mg/dL — ABNORMAL HIGH (ref 70–99)
Glucose-Capillary: 103 mg/dL — ABNORMAL HIGH (ref 70–99)
Glucose-Capillary: 117 mg/dL — ABNORMAL HIGH (ref 70–99)

## 2020-10-06 LAB — CREATININE, SERUM
Creatinine, Ser: 1.17 mg/dL — ABNORMAL HIGH (ref 0.44–1.00)
GFR, Estimated: 50 mL/min — ABNORMAL LOW (ref >60)

## 2020-10-06 LAB — SARS CORONAVIRUS 2 (TAT 6-24 HRS): SARS Coronavirus 2: NEGATIVE

## 2020-10-06 MED ORDER — VITAMIN B-12 1000 MCG PO TABS
1000.0000 ug | ORAL_TABLET | Freq: Every day | ORAL | Status: DC
  Administered 2020-10-07: 1000 ug via ORAL
  Filled 2020-10-06 (×6): qty 1
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
## 2020-10-06 NOTE — Progress Notes (Signed)
    [facility]   Reason for visit: Follow up on bacteremia  Interval History: repeat blood cultures ngtd  Physical Exam: Constitutional:  Vitals:   10/06/20 0251 10/06/20 0800  BP: 123/71   Pulse: 90   Resp: 19 20  Temp: 98.6 F (37 C)   SpO2: 98%    patient appears in NAD  Impression/Plan: bacteremia.  1/4 bottles positive.  Likely skin source.  TTE without obvious vegetation.  TEE is scheduled for Thursday, 5/26.  If negative for vegetation, Kaitlyn [name] can be discharged after that with oral keflex for 7 days

## 2020-10-06 NOTE — TOC Progression Note (Addendum)
Transition of Care [facility]) - Progression Note    Patient Details  Name: Kaitlyn [name] MRN: [id number] Date of Birth: Mar 16, 1951  Transition of Care [facility]) CM/SW Contact  Ida Rogue, [place] Phone Number: 10/06/2020, 10:45 AM  Clinical Narrative:   Confirmed with patient that she is still planning on going to short term rehab, and talked to her about on-going plan. Kim with [facility] has made a tentative bed offer.  [name]'s understanding from talking to DSS is that she has been evicted from her apartment, and consequently she will need to be in "long term care."  I told her I needed to find out from the apartment manager and DSS about the eviction and would get back with her.  On one hand she says she likes her three room apartment and would like to return there, but also states that living somewhere where the meals are prepared and someone is there to look in on her sounds appealing as well.  Will follow up with her once I have ascertained apartment status. TOC will continue to follow during the course of hospitalization.  Addendum:  Spoke with [name], complex manager at [facility]  [phone], who states patient has not been evicted.  Her apartment awaits if she chooses to return there.      Expected Discharge Plan: Skilled Nursing Facility Barriers to Discharge: SNF Pending bed offer  Expected Discharge Plan and Services Expected Discharge Plan: Skilled Nursing Facility In-house Referral: Clinical Social Work     Living arrangements for the past 2 months: Apartment                                       Social Determinants of Health (SDOH) Interventions    Readmission Risk Interventions Readmission Risk Prevention Plan 10/17/2018  Transportation Screening Complete  PCP or Specialist Appt within 3-5 Days Not Complete  Not Complete comments plan to d/c to SNF  HRI or Home Care Consult Complete  Social Work Consult for Recovery Care Planning/Counseling  Complete  Palliative Care Screening Not Applicable  Medication Review Oceanographer) Complete  Some recent data might be hidden

## 2020-10-07 DIAGNOSIS — R7881 Bacteremia: Secondary | ICD-10-CM | POA: Diagnosis not present

## 2020-10-07 DIAGNOSIS — L03115 Cellulitis of right lower limb: Secondary | ICD-10-CM | POA: Diagnosis not present

## 2020-10-07 DIAGNOSIS — B888 Other specified infestations: Secondary | ICD-10-CM | POA: Diagnosis not present

## 2020-10-07 DIAGNOSIS — L03116 Cellulitis of left lower limb: Secondary | ICD-10-CM | POA: Diagnosis not present

## 2020-10-07 LAB — GLUCOSE, CAPILLARY
Glucose-Capillary: 91 mg/dL (ref 70–99)
Glucose-Capillary: 97 mg/dL (ref 70–99)
Glucose-Capillary: 123 mg/dL — ABNORMAL HIGH (ref 70–99)
Glucose-Capillary: 129 mg/dL — ABNORMAL HIGH (ref 70–99)

## 2020-10-07 LAB — CULTURE, BLOOD (ROUTINE X 2)
Culture: NO GROWTH
Special Requests: ADEQUATE

## 2020-10-07 NOTE — Care Management Important Message (Signed)
Important Message  Patient Details IM Letter given to the Patient. Name: Kaitlyn [name] MRN: [id number] Date of Birth: 22-May-1950   Medicare Important Message Given:  Yes     Caren Macadam 10/07/2020, 11:51 AM
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
## 2020-10-08 DIAGNOSIS — L03116 Cellulitis of left lower limb: Secondary | ICD-10-CM | POA: Diagnosis not present

## 2020-10-08 DIAGNOSIS — L03115 Cellulitis of right lower limb: Secondary | ICD-10-CM | POA: Diagnosis not present

## 2020-10-08 DIAGNOSIS — R7881 Bacteremia: Secondary | ICD-10-CM | POA: Diagnosis not present

## 2020-10-08 DIAGNOSIS — B888 Other specified infestations: Secondary | ICD-10-CM | POA: Diagnosis not present

## 2020-10-08 LAB — CBC WITH DIFFERENTIAL/PLATELET
Abs Immature Granulocytes: 0.03 10*3/uL (ref 0.00–0.07)
Basophils Absolute: 0.1 10*3/uL (ref 0.0–0.1)
Basophils Relative: 1 %
Eosinophils Absolute: 0.4 10*3/uL (ref 0.0–0.5)
Eosinophils Relative: 6 %
HCT: 26.4 % — ABNORMAL LOW (ref 36.0–46.0)
Hemoglobin: 7.6 g/dL — ABNORMAL LOW (ref 12.0–15.0)
Immature Granulocytes: 1 %
Lymphocytes Relative: 17 %
Lymphs Abs: 1.1 10*3/uL (ref 0.7–4.0)
MCH: 30.8 pg (ref 26.0–34.0)
MCHC: 28.8 g/dL — ABNORMAL LOW (ref 30.0–36.0)
MCV: 106.9 fL — ABNORMAL HIGH (ref 80.0–100.0)
Monocytes Absolute: 0.6 10*3/uL (ref 0.1–1.0)
Monocytes Relative: 9 %
Neutro Abs: 4.1 10*3/uL (ref 1.7–7.7)
Neutrophils Relative %: 66 %
Platelets: 152 10*3/uL (ref 150–400)
RBC: 2.47 MIL/uL — ABNORMAL LOW (ref 3.87–5.11)
RDW: 17.5 % — ABNORMAL HIGH (ref 11.5–15.5)
WBC: 6.2 10*3/uL (ref 4.0–10.5)
nRBC: 0 % (ref 0.0–0.2)

## 2020-10-08 LAB — GLUCOSE, CAPILLARY
Glucose-Capillary: 93 mg/dL (ref 70–99)
Glucose-Capillary: 109 mg/dL — ABNORMAL HIGH (ref 70–99)
Glucose-Capillary: 111 mg/dL — ABNORMAL HIGH (ref 70–99)
Glucose-Capillary: 98 mg/dL (ref 70–99)

## 2020-10-08 LAB — BASIC METABOLIC PANEL
Anion gap: 4 — ABNORMAL LOW (ref 5–15)
BUN: 15 mg/dL (ref 8–23)
CO2: 23 mmol/L (ref 22–32)
Calcium: 7.7 mg/dL — ABNORMAL LOW (ref 8.9–10.3)
Chloride: 109 mmol/L (ref 98–111)
Creatinine, Ser: 0.88 mg/dL (ref 0.44–1.00)
GFR, Estimated: >60 mL/min (ref >60)
Glucose, Bld: 91 mg/dL (ref 70–99)
Potassium: 4.5 mmol/L (ref 3.5–5.1)
Sodium: 136 mmol/L (ref 135–145)

## 2020-10-08 LAB — MAGNESIUM: Magnesium: 2.1 mg/dL (ref 1.7–2.4)

## 2020-10-08 NOTE — Progress Notes (Signed)
Physical Therapy Treatment Patient Details Name: Kaitlyn [name] MRN: [id number] DOB: [birth year] Today's Date: 10/08/2020    History of Present Illness Pt is 70 yo female admitted for bil lower extremity cellulitis and started on IV vancomycin now IV Ancef.  This is 4th admission since 09/11/20 -due to bed bugs, falls, weakness, cellulitis. Pt with PMH of CAD, MI, cardiomyopathy, CHF, HTN, afib, DM2, seizure, and ambulatory dysfunction.    PT Comments    General Comments: pt appears AxO x 3 however has poor insight on her medical condition and hygiene. General bed mobility comments: pt started with bed mobility but was quick to say "I can't move move my legs" and c/o pain multiple areas esp her back.  "It hurts to bad to move", "you don't understand" Pt declined any OOB suggestion/activity.  NT called to assist with hygiene as pt was soaked in urine.   Follow Up Recommendations  SNF     Equipment Recommendations       Recommendations for Other Services       Precautions / Restrictions Precautions Precautions: Fall Precaution Comments: h/o falls, bed bugs, poor living conditions and poor medical insight with Hx multiple PT refusals/behavior    Mobility  Bed Mobility Overal bed mobility: Needs Assistance Bed Mobility: Rolling Rolling: Supervision   Supine to sit: Supervision Sit to supine: Supervision   General bed mobility comments: pt started with bed mobility but was quick to say "I can't move move my legs" and c/o pain multiple areas esp her back.  "It hurts to bad to move", "you don't understand"    Transfers                 General transfer comment: pt declined to attempt any OOB activity or transfer.  Pt stated "I can't cause my legs hurt too bad".  Pt stated she would transfer into a rolling chair at home the scoot "to were I needed to go".  Ambulation/Gait             General Gait Details: pt declined to amb and became increasingly  agitated.   Stairs             Wheelchair Mobility    Modified Rankin (Stroke Patients Only)       Balance                                            Cognition Arousal/Alertness: Awake/alert Behavior During Therapy: Anxious Overall Cognitive Status: No family/caregiver present to determine baseline cognitive functioning                                 General Comments: pt appears AxO x 3 however has poor insight on her medical condition and hygiene.      Exercises      General Comments        Pertinent Vitals/Pain Pain Assessment: Faces Faces Pain Scale: Hurts even more Pain Location: back, B LE, B feet, neck, buttocks Pain Descriptors / Indicators: Discomfort;Grimacing Pain Intervention(s): Monitored during session;Repositioned    Home Living                      Prior Function            PT Goals (current goals can now be found in  the care plan section) Progress towards PT goals: Progressing toward goals    Frequency    Min 2X/week      PT Plan Current plan remains appropriate    Co-evaluation              AM-PAC PT "6 Clicks" Mobility   Outcome Measure  Help needed turning from your back to your side while in a flat bed without using bedrails?: A Little Help needed moving from lying on your back to sitting on the side of a flat bed without using bedrails?: A Little Help needed moving to and from a bed to a chair (including a wheelchair)?: A Little Help needed standing up from a chair using your arms (e.g., wheelchair or bedside chair)?: A Lot Help needed to walk in hospital room?: Total Help needed climbing 3-5 steps with a railing? : Total 6 Click Score: 13    End of Session Equipment Utilized During Treatment: Gait belt Activity Tolerance:  (self limiting) Patient left: in bed;with call bell/phone within reach;with bed alarm set;with nursing/sitter in room Nurse Communication: Mobility  status PT Visit Diagnosis: Muscle weakness (generalized) (M62.81);Difficulty in walking, not elsewhere classified (R26.2);History of falling (Z91.81)     Time: 3220-2542 PT Time Calculation (min) (ACUTE ONLY): 23 min  Charges:  $Therapeutic Activity: 23-37 mins                     Felecia Shelling  PTA Acute  Rehabilitation Services Pager      [phone] Office      [phone]

## 2020-10-08 NOTE — Progress Notes (Signed)
Pharmacy Antibiotic Note  Kaitlyn [name] is a 70 y.o. female admitted on 10/01/2020 with cellulitis.  Patient is currently on cefazolin (pharmacy to dose) for MSSA bacteremia, being followed by ID.   Today, 10/08/20  WBC now WNL  SCr now < 1  ECHO 5/21: No evidence vegetation Today is day #8 IV antibiotics  Plan:  Continue cefazolin 2 g IV q8h  Follow for ID recommendations  Will sign off and follow remotely    Height: 5\' 11"  (180.3 cm) Weight: 117.8 kg (259 lb 11.2 oz) IBW/kg (Calculated) : 70.8  Temp (24hrs), Avg:97.5 F (36.4 C), Min:97.3 F (36.3 C), Max:97.7 F (36.5 C)  Recent Labs  Lab 10/01/20 1516 10/01/20 1538 10/01/20 1839 10/01/20 2237 10/02/20 0053 10/02/20 1102 10/03/20 0442 10/05/20 0902 10/06/20 0918 10/08/20 0533  WBC  --    < >  --  12.4* 12.5*  --  8.3 5.1  --  6.2  CREATININE  --    < >  --  1.16* 1.27*  --  1.23* 1.13* 1.17* 0.88  LATICACIDVEN 2.7*  --  1.8  --  2.3* 1.2  --   --   --   --    < > = values in this interval not displayed.    Estimated Creatinine Clearance: 84.1 mL/min (by C-G formula based on SCr of 0.88 mg/dL).    Allergies  Allergen Reactions  . Caffeine Palpitations and Other (See Comments)    Seizure from large doses, heart races from small doses  . Cranberry Shortness Of Breath, Diarrhea, Itching and Other (See Comments)    Severe headache.."Ocean Spray cranberry juice"  . Lisinopril Diarrhea, Itching and Swelling    Site of swelling not recalled  . Penicillins Other (See Comments)    Unknown childhood allergy Has patient had a PCN reaction causing immediate rash, facial/tongue/throat swelling, SOB or lightheadedness with hypotension: unknown Has patient had a PCN reaction causing severe rash involving mucus membranes or skin necrosis: unknown Has patient had a PCN reaction that required hospitalization unknown Has patient had a PCN reaction occurring within the last 10 years: no If all of the above answers are  "NO", then may proceed with Cephalosporin use.   . Cranberry Juice Concentrate [Cranberry Extract]     Headaches  . Lactose Intolerance (Gi)   . Other Other (See Comments)    Stimulants- patient has a past history of seizures    Antimicrobials this admission: vancomycin 5/18 >> 5/19 Cefazolin 5/19 >>   Dose adjustments this admission:  Microbiology results: 5/18 BCx: MSSA 5/21 repeat BCx: ngtd 5/18 UCx:    Thank you for allowing pharmacy to be a part of this patient's care.  6/18, PharmD 10/08/2020 9:36 AM
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
## 2020-10-08 NOTE — Progress Notes (Addendum)
    [facility]   Reason for visit: Follow up on bacteremia  Interval History: TEE scheduled for tomorrow  Physical Exam: Constitutional:  Vitals:   10/07/20 2121 10/08/20 0555  BP: 137/81 (!) 154/91  Pulse: 81 94  Resp: 18 18  Temp: (!) 97.4 F (36.3 C) 97.7 F (36.5 C)  SpO2: 100% 97%  NAD  Impression: bacteremia.  Repeat cultures have remained ngtd.   Plan: 1.  TEE tomorrow and if negative, 7 days of Keflex.

## 2020-10-09 ENCOUNTER — Encounter (HOSPITAL_COMMUNITY)
Admission: EM | Disposition: A | Discharge status: Skilled Nursing Facility | Payer: Self-pay | Source: Home / Self Care | Attending: Internal Medicine

## 2020-10-09 ENCOUNTER — Other Ambulatory Visit (HOSPITAL_COMMUNITY): Payer: Medicare (Managed Care)

## 2020-10-09 ENCOUNTER — Encounter (HOSPITAL_COMMUNITY): Payer: Self-pay

## 2020-10-09 DIAGNOSIS — L03115 Cellulitis of right lower limb: Secondary | ICD-10-CM | POA: Diagnosis not present

## 2020-10-09 DIAGNOSIS — L03116 Cellulitis of left lower limb: Secondary | ICD-10-CM | POA: Diagnosis not present

## 2020-10-09 LAB — CULTURE, BLOOD (ROUTINE X 2)
Culture: NO GROWTH
Culture: NO GROWTH
Special Requests: ADEQUATE

## 2020-10-09 LAB — BASIC METABOLIC PANEL
Anion gap: 5 (ref 5–15)
BUN: 15 mg/dL (ref 8–23)
CO2: 22 mmol/L (ref 22–32)
Calcium: 7.6 mg/dL — ABNORMAL LOW (ref 8.9–10.3)
Chloride: 108 mmol/L (ref 98–111)
Creatinine, Ser: 0.95 mg/dL (ref 0.44–1.00)
GFR, Estimated: >60 mL/min (ref >60)
Glucose, Bld: 106 mg/dL — ABNORMAL HIGH (ref 70–99)
Potassium: 4.2 mmol/L (ref 3.5–5.1)
Sodium: 135 mmol/L (ref 135–145)

## 2020-10-09 LAB — CBC WITH DIFFERENTIAL/PLATELET
Abs Immature Granulocytes: 0.04 10*3/uL (ref 0.00–0.07)
Basophils Absolute: 0.1 10*3/uL (ref 0.0–0.1)
Basophils Relative: 1 %
Eosinophils Absolute: 0.4 10*3/uL (ref 0.0–0.5)
Eosinophils Relative: 7 %
HCT: 25.9 % — ABNORMAL LOW (ref 36.0–46.0)
Hemoglobin: 7.4 g/dL — ABNORMAL LOW (ref 12.0–15.0)
Immature Granulocytes: 1 %
Lymphocytes Relative: 16 %
Lymphs Abs: 0.9 10*3/uL (ref 0.7–4.0)
MCH: 30.8 pg (ref 26.0–34.0)
MCHC: 28.6 g/dL — ABNORMAL LOW (ref 30.0–36.0)
MCV: 107.9 fL — ABNORMAL HIGH (ref 80.0–100.0)
Monocytes Absolute: 0.5 10*3/uL (ref 0.1–1.0)
Monocytes Relative: 8 %
Neutro Abs: 3.8 10*3/uL (ref 1.7–7.7)
Neutrophils Relative %: 67 %
Platelets: 154 10*3/uL (ref 150–400)
RBC: 2.4 MIL/uL — ABNORMAL LOW (ref 3.87–5.11)
RDW: 17.3 % — ABNORMAL HIGH (ref 11.5–15.5)
WBC: 5.6 10*3/uL (ref 4.0–10.5)
nRBC: 0 % (ref 0.0–0.2)

## 2020-10-09 LAB — GLUCOSE, CAPILLARY
Glucose-Capillary: 99 mg/dL (ref 70–99)
Glucose-Capillary: 97 mg/dL (ref 70–99)
Glucose-Capillary: 99 mg/dL (ref 70–99)
Glucose-Capillary: 98 mg/dL (ref 70–99)

## 2020-10-09 LAB — MAGNESIUM: Magnesium: 2 mg/dL (ref 1.7–2.4)

## 2020-10-09 LAB — C-REACTIVE PROTEIN: CRP: 0.7 mg/dL (ref <1.0)

## 2020-10-09 SURGERY — ECHOCARDIOGRAM, TRANSESOPHAGEAL
Anesthesia: Monitor Anesthesia Care

## 2020-10-09 NOTE — Progress Notes (Signed)
Notified by [facility] that patient refused TEE. MD and RN spoke with patient.  TEE cancelled.
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
## 2020-10-09 NOTE — Progress Notes (Signed)
Notified Dr. Lucianne Muss that pt is refusing scheduled TEE. Dr. Lucianne Muss came to pts room to discuss procedure with pt. Pt continues to refuse. Order placed for diet.

## 2020-10-09 NOTE — TOC Progression Note (Signed)
Transition of Care [facility]) - Progression Note    Patient Details  Name: Kenadie Royce MRN: [id number] Date of Birth: [birth year]  [facility] CM/SW Contact  Ida Rogue, [place] Phone Number: 10/09/2020, 12:43 PM  Clinical Narrative:   Selena Batten at [facility] they will not have a bed until next week.  All female beds were filled yesterday. This is the only bed offer received. TOC will continue to follow during the course of hospitalization.     Expected Discharge Plan: Skilled Nursing Facility Barriers to Discharge: SNF Pending bed offer  Expected Discharge Plan and Services Expected Discharge Plan: Skilled Nursing Facility In-house Referral: Clinical Social Work     Living arrangements for the past 2 months: Apartment                                       Social Determinants of Health (SDOH) Interventions    Readmission Risk Interventions Readmission Risk Prevention Plan 10/17/2018  Transportation Screening Complete  PCP or Specialist Appt within 3-5 Days Not Complete  Not Complete comments plan to d/c to SNF  HRI or Home Care Consult Complete  Social Work Consult for Recovery Care Planning/Counseling Complete  Palliative Care Screening Not Applicable  Medication Review Oceanographer) Complete  Some recent data might be hidden

## 2020-10-09 NOTE — Progress Notes (Signed)
    [facility]   Reason for visit: Follow up on cellulitis  Interval History: refused TEE  Physical Exam: Constitutional:  Vitals:   10/08/20 2109 10/09/20 0443  BP: (!) 152/85 119/76  Pulse: 97 100  Resp: 18 18  Temp: 97.7 F (36.5 C) 98.6 F (37 C)  SpO2: 97% 97%   patient appears in NAD  Impression/plan: cellulitis.  MSSA bacteremia. No evidence of infective endocarditis though patient has refused a more definitive test.   She can continue with oral Keflex for 7 days at discharge.  She should return with new fever or concerns after completing antibiotics.   She should follow closely with her PCP  I will sign off, call with questions

## 2020-10-10 DIAGNOSIS — L03116 Cellulitis of left lower limb: Secondary | ICD-10-CM | POA: Diagnosis not present

## 2020-10-10 DIAGNOSIS — L03115 Cellulitis of right lower limb: Secondary | ICD-10-CM | POA: Diagnosis not present

## 2020-10-10 LAB — CBC
HCT: 26.6 % — ABNORMAL LOW (ref 36.0–46.0)
Hemoglobin: 7.6 g/dL — ABNORMAL LOW (ref 12.0–15.0)
MCH: 30.6 pg (ref 26.0–34.0)
MCHC: 28.6 g/dL — ABNORMAL LOW (ref 30.0–36.0)
MCV: 107.3 fL — ABNORMAL HIGH (ref 80.0–100.0)
Platelets: 145 10*3/uL — ABNORMAL LOW (ref 150–400)
RBC: 2.48 MIL/uL — ABNORMAL LOW (ref 3.87–5.11)
RDW: 17.4 % — ABNORMAL HIGH (ref 11.5–15.5)
WBC: 5.1 10*3/uL (ref 4.0–10.5)
nRBC: 0 % (ref 0.0–0.2)

## 2020-10-10 LAB — GLUCOSE, CAPILLARY
Glucose-Capillary: 103 mg/dL — ABNORMAL HIGH (ref 70–99)
Glucose-Capillary: 117 mg/dL — ABNORMAL HIGH (ref 70–99)
Glucose-Capillary: 108 mg/dL — ABNORMAL HIGH (ref 70–99)
Glucose-Capillary: 125 mg/dL — ABNORMAL HIGH (ref 70–99)
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
## 2020-10-10 NOTE — Progress Notes (Signed)
Patient had extra large BM today. Bed bath with complete linen change provided.

## 2020-10-10 NOTE — Care Management Important Message (Signed)
Important Message  Patient Details IM Letter given to the Patient. Name: Kaitlyn [name] MRN: [id number] Date of Birth: 1951/03/02   Medicare Important Message Given:  Yes     Caren Macadam 10/10/2020, 10:10 AM

## 2020-10-11 DIAGNOSIS — L03115 Cellulitis of right lower limb: Secondary | ICD-10-CM | POA: Diagnosis not present

## 2020-10-11 DIAGNOSIS — L03116 Cellulitis of left lower limb: Secondary | ICD-10-CM | POA: Diagnosis not present

## 2020-10-11 LAB — CBC
HCT: 25.9 % — ABNORMAL LOW (ref 36.0–46.0)
Hemoglobin: 7.6 g/dL — ABNORMAL LOW (ref 12.0–15.0)
MCH: 31 pg (ref 26.0–34.0)
MCHC: 29.3 g/dL — ABNORMAL LOW (ref 30.0–36.0)
MCV: 105.7 fL — ABNORMAL HIGH (ref 80.0–100.0)
Platelets: 143 10*3/uL — ABNORMAL LOW (ref 150–400)
RBC: 2.45 MIL/uL — ABNORMAL LOW (ref 3.87–5.11)
RDW: 17.4 % — ABNORMAL HIGH (ref 11.5–15.5)
WBC: 4.7 10*3/uL (ref 4.0–10.5)
nRBC: 0 % (ref 0.0–0.2)

## 2020-10-11 LAB — GLUCOSE, CAPILLARY
Glucose-Capillary: 98 mg/dL (ref 70–99)
Glucose-Capillary: 114 mg/dL — ABNORMAL HIGH (ref 70–99)
Glucose-Capillary: 126 mg/dL — ABNORMAL HIGH (ref 70–99)
Glucose-Capillary: 137 mg/dL — ABNORMAL HIGH (ref 70–99)
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
## 2020-10-11 NOTE — Progress Notes (Signed)
Patient has been refusing Lovenox injection, MD was notified, we will put pt. On SCD.

## 2020-10-12 DIAGNOSIS — L03116 Cellulitis of left lower limb: Secondary | ICD-10-CM | POA: Diagnosis not present

## 2020-10-12 DIAGNOSIS — L03115 Cellulitis of right lower limb: Secondary | ICD-10-CM | POA: Diagnosis not present

## 2020-10-12 LAB — GLUCOSE, CAPILLARY
Glucose-Capillary: 94 mg/dL (ref 70–99)
Glucose-Capillary: 118 mg/dL — ABNORMAL HIGH (ref 70–99)
Glucose-Capillary: 105 mg/dL — ABNORMAL HIGH (ref 70–99)
Glucose-Capillary: 131 mg/dL — ABNORMAL HIGH (ref 70–99)

## 2020-10-12 LAB — HEMOGLOBIN AND HEMATOCRIT, BLOOD
HCT: 26.2 % — ABNORMAL LOW (ref 36.0–46.0)
Hemoglobin: 7.5 g/dL — ABNORMAL LOW (ref 12.0–15.0)
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
## 2020-10-12 NOTE — Plan of Care (Signed)
  Problem: Elimination: Goal: Will not experience complications related to urinary retention Outcome: Progressing   Problem: Safety: Goal: Ability to remain free from injury will improve Outcome: Progressing   

## 2020-10-13 DIAGNOSIS — L03116 Cellulitis of left lower limb: Secondary | ICD-10-CM | POA: Diagnosis not present

## 2020-10-13 DIAGNOSIS — L03115 Cellulitis of right lower limb: Secondary | ICD-10-CM | POA: Diagnosis not present

## 2020-10-13 LAB — BASIC METABOLIC PANEL
Anion gap: 3 — ABNORMAL LOW (ref 5–15)
BUN: 13 mg/dL (ref 8–23)
CO2: 24 mmol/L (ref 22–32)
Calcium: 7.9 mg/dL — ABNORMAL LOW (ref 8.9–10.3)
Chloride: 110 mmol/L (ref 98–111)
Creatinine, Ser: 0.9 mg/dL (ref 0.44–1.00)
GFR, Estimated: >60 mL/min (ref >60)
Glucose, Bld: 110 mg/dL — ABNORMAL HIGH (ref 70–99)
Potassium: 4.2 mmol/L (ref 3.5–5.1)
Sodium: 137 mmol/L (ref 135–145)

## 2020-10-13 LAB — MAGNESIUM: Magnesium: 1.8 mg/dL (ref 1.7–2.4)

## 2020-10-13 LAB — GLUCOSE, CAPILLARY
Glucose-Capillary: 119 mg/dL — ABNORMAL HIGH (ref 70–99)
Glucose-Capillary: 112 mg/dL — ABNORMAL HIGH (ref 70–99)
Glucose-Capillary: 109 mg/dL — ABNORMAL HIGH (ref 70–99)
Glucose-Capillary: 137 mg/dL — ABNORMAL HIGH (ref 70–99)

## 2020-10-13 LAB — CBC
HCT: 26.9 % — ABNORMAL LOW (ref 36.0–46.0)
Hemoglobin: 7.8 g/dL — ABNORMAL LOW (ref 12.0–15.0)
MCH: 30.6 pg (ref 26.0–34.0)
MCHC: 29 g/dL — ABNORMAL LOW (ref 30.0–36.0)
MCV: 105.5 fL — ABNORMAL HIGH (ref 80.0–100.0)
Platelets: 143 10*3/uL — ABNORMAL LOW (ref 150–400)
RBC: 2.55 MIL/uL — ABNORMAL LOW (ref 3.87–5.11)
RDW: 17.3 % — ABNORMAL HIGH (ref 11.5–15.5)
WBC: 4.9 10*3/uL (ref 4.0–10.5)
nRBC: 0 % (ref 0.0–0.2)

## 2020-10-13 LAB — PHOSPHORUS: Phosphorus: 3.2 mg/dL (ref 2.5–4.6)
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
## 2020-10-13 NOTE — Progress Notes (Signed)
PT Cancellation Note  Patient Details Name: Kaitlyn [name] MRN: [id number] DOB: 11/11/1950   Cancelled Treatment:     pt non compliant/behavioral  with staff performing bath and bed change so did not attempt.  Per TOC pt should be D/C tomorrow.   Armando Reichert 10/13/2020, 3:16 PM

## 2020-10-13 NOTE — Progress Notes (Signed)
Pt refused all treatment with cream until end of shift. Total applications indicated on this nurses shift was three but only had permission to complete one.

## 2020-10-14 DIAGNOSIS — L03116 Cellulitis of left lower limb: Secondary | ICD-10-CM | POA: Diagnosis not present

## 2020-10-14 DIAGNOSIS — L03115 Cellulitis of right lower limb: Secondary | ICD-10-CM | POA: Diagnosis not present

## 2020-10-14 LAB — RESP PANEL BY RT-PCR (FLU A&B, COVID) ARPGX2
Influenza A by PCR: NEGATIVE
Influenza B by PCR: NEGATIVE
SARS Coronavirus 2 by RT PCR: NEGATIVE

## 2020-10-14 LAB — GLUCOSE, CAPILLARY
Glucose-Capillary: 88 mg/dL (ref 70–99)
Glucose-Capillary: 104 mg/dL — ABNORMAL HIGH (ref 70–99)
Glucose-Capillary: 129 mg/dL — ABNORMAL HIGH (ref 70–99)
Glucose-Capillary: 114 mg/dL — ABNORMAL HIGH (ref 70–99)

## 2020-10-14 LAB — HEMOGLOBIN AND HEMATOCRIT, BLOOD
HCT: 27.7 % — ABNORMAL LOW (ref 36.0–46.0)
Hemoglobin: 7.9 g/dL — ABNORMAL LOW (ref 12.0–15.0)

## 2020-10-14 MED ORDER — CEPHALEXIN 500 MG PO CAPS: 500.0000 mg | ORAL_CAPSULE | Freq: Four times a day (QID) | ORAL | 0 refills | Status: AC
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
## 2020-10-15 DIAGNOSIS — L03115 Cellulitis of right lower limb: Secondary | ICD-10-CM | POA: Diagnosis not present

## 2020-10-15 DIAGNOSIS — L03116 Cellulitis of left lower limb: Secondary | ICD-10-CM | POA: Diagnosis not present

## 2020-10-15 LAB — GLUCOSE, CAPILLARY
Glucose-Capillary: 106 mg/dL — ABNORMAL HIGH (ref 70–99)
Glucose-Capillary: 119 mg/dL — ABNORMAL HIGH (ref 70–99)

## 2020-10-15 NOTE — Discharge Instructions (Signed)
Advised to take Keflex 4 times daily for 7 days for bacteremia. Patient has refused TEE to rule out infective endocarditis. Patient is being discharged to skilled nursing facility for rehab. Advised to follow PCP in 1 week. Patient has been treated for bedbug infestation.

## 2020-10-15 NOTE — TOC Transition Note (Addendum)
Transition of Care [facility]) - CM/SW Discharge Note   Patient Details  Name: [name] MRN: [id number] Date of Birth: 05/23/1950  Transition of Care [facility]) CM/SW Contact:  Ida Rogue, LCSW Phone Number: 10/15/2020, 11:38 AM   Clinical Narrative:   Patient who is stable for discharge will transfer to [facility]. PTAR arranged.  Nursing, please call report to [phone]. Room 124b. TOC sign off.    Final next level of care: Skilled Nursing Facility Barriers to Discharge: Barriers Resolved   Patient Goals and CMS Choice        Discharge Placement                       Discharge Plan and Services In-house Referral: Clinical Social Work                                   Social Determinants of Health (SDOH) Interventions     Readmission Risk Interventions Readmission Risk Prevention Plan 10/17/2018  Transportation Screening Complete  PCP or Specialist Appt within 3-5 Days Not Complete  Not Complete comments plan to d/c to SNF  HRI or Home Care Consult Complete  Social Work Consult for Recovery Care Planning/Counseling Complete  Palliative Care Screening Not Applicable  Medication Review Ocean[facility]) Complete  Some recent data might be hidden

## 2021-03-03 ENCOUNTER — Emergency Department (HOSPITAL_COMMUNITY): Payer: Medicare (Managed Care)

## 2021-03-03 ENCOUNTER — Emergency Department (HOSPITAL_COMMUNITY)
Admission: EM | Admit: 2021-03-03 | Discharge: 2021-03-04 | Disposition: A | Discharge status: Home / Self Care | Payer: Medicare (Managed Care) | Attending: Emergency Medicine | Admitting: Emergency Medicine

## 2021-03-03 ENCOUNTER — Encounter (HOSPITAL_COMMUNITY): Payer: Self-pay | Admitting: Emergency Medicine

## 2021-03-03 ENCOUNTER — Other Ambulatory Visit: Payer: Self-pay

## 2021-03-03 DIAGNOSIS — I2510 Atherosclerotic heart disease of native coronary artery without angina pectoris: Secondary | ICD-10-CM | POA: Diagnosis not present

## 2021-03-03 DIAGNOSIS — Z79899 Other long term (current) drug therapy: Secondary | ICD-10-CM | POA: Diagnosis not present

## 2021-03-03 DIAGNOSIS — I110 Hypertensive heart disease with heart failure: Secondary | ICD-10-CM | POA: Insufficient documentation

## 2021-03-03 DIAGNOSIS — Z7901 Long term (current) use of anticoagulants: Secondary | ICD-10-CM | POA: Diagnosis not present

## 2021-03-03 DIAGNOSIS — E119 Type 2 diabetes mellitus without complications: Secondary | ICD-10-CM | POA: Diagnosis not present

## 2021-03-03 DIAGNOSIS — R627 Adult failure to thrive: Secondary | ICD-10-CM | POA: Diagnosis not present

## 2021-03-03 DIAGNOSIS — I5043 Acute on chronic combined systolic (congestive) and diastolic (congestive) heart failure: Secondary | ICD-10-CM | POA: Diagnosis not present

## 2021-03-03 DIAGNOSIS — Z8616 Personal history of COVID-19: Secondary | ICD-10-CM | POA: Diagnosis not present

## 2021-03-03 DIAGNOSIS — I4891 Unspecified atrial fibrillation: Secondary | ICD-10-CM | POA: Insufficient documentation

## 2021-03-03 LAB — COMPREHENSIVE METABOLIC PANEL
ALT: 7 U/L (ref 0–44)
AST: 15 U/L (ref 15–41)
Albumin: 3.9 g/dL (ref 3.5–5.0)
Alkaline Phosphatase: 61 U/L (ref 38–126)
Anion gap: 8 (ref 5–15)
BUN: 16 mg/dL (ref 8–23)
CO2: 23 mmol/L (ref 22–32)
Calcium: 8.7 mg/dL — ABNORMAL LOW (ref 8.9–10.3)
Chloride: 108 mmol/L (ref 98–111)
Creatinine, Ser: 0.97 mg/dL (ref 0.44–1.00)
GFR, Estimated: >60 mL/min (ref >60)
Glucose, Bld: 113 mg/dL — ABNORMAL HIGH (ref 70–99)
Potassium: 4 mmol/L (ref 3.5–5.1)
Sodium: 139 mmol/L (ref 135–145)
Total Bilirubin: 1.2 mg/dL (ref 0.3–1.2)
Total Protein: 8 g/dL (ref 6.5–8.1)

## 2021-03-03 LAB — CBC WITH DIFFERENTIAL/PLATELET
Abs Immature Granulocytes: 0.02 10*3/uL (ref 0.00–0.07)
Basophils Absolute: 0.1 10*3/uL (ref 0.0–0.1)
Basophils Relative: 1 %
Eosinophils Absolute: 0.2 10*3/uL (ref 0.0–0.5)
Eosinophils Relative: 2 %
HCT: 41.7 % (ref 36.0–46.0)
Hemoglobin: 12.9 g/dL (ref 12.0–15.0)
Immature Granulocytes: 0 %
Lymphocytes Relative: 12 %
Lymphs Abs: 1 10*3/uL (ref 0.7–4.0)
MCH: 30.5 pg (ref 26.0–34.0)
MCHC: 30.9 g/dL (ref 30.0–36.0)
MCV: 98.6 fL (ref 80.0–100.0)
Monocytes Absolute: 0.5 10*3/uL (ref 0.1–1.0)
Monocytes Relative: 6 %
Neutro Abs: 6.7 10*3/uL (ref 1.7–7.7)
Neutrophils Relative %: 79 %
Platelets: 173 10*3/uL (ref 150–400)
RBC: 4.23 MIL/uL (ref 3.87–5.11)
RDW: 16.5 % — ABNORMAL HIGH (ref 11.5–15.5)
WBC: 8.5 10*3/uL (ref 4.0–10.5)
nRBC: 0 % (ref 0.0–0.2)

## 2021-03-03 LAB — BRAIN NATRIURETIC PEPTIDE: B Natriuretic Peptide: 116.2 pg/mL — ABNORMAL HIGH (ref 0.0–100.0)

## 2021-03-03 LAB — LIPASE, BLOOD: Lipase: 28 U/L (ref 11–51)

## 2021-03-03 MED ORDER — LOPERAMIDE HCL 1 MG/7.5ML PO SUSP
2.0000 mg | ORAL | Status: DC | PRN
  Administered 2021-03-03: 2 mg via ORAL
  Filled 2021-03-03 (×2): qty 15

## 2021-03-03 MED ORDER — METOPROLOL SUCCINATE ER 50 MG PO TB24
25.0000 mg | ORAL_TABLET | Freq: Every day | ORAL | Status: DC
  Administered 2021-03-03 – 2021-03-04 (×2): 25 mg via ORAL
  Filled 2021-03-03 (×2): qty 1

## 2021-03-03 MED ORDER — METOPROLOL TARTRATE 5 MG/5ML IV SOLN
5.0000 mg | Freq: Once | INTRAVENOUS | Status: AC
  Administered 2021-03-03: 5 mg via INTRAVENOUS
  Filled 2021-03-03: qty 5
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
## 2021-03-04 NOTE — ED Notes (Addendum)
Pt departed the dept via [facility]. 

## 2021-03-04 NOTE — ED Notes (Signed)
Saltine crackers and ice water given to patient
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
## 2021-03-04 NOTE — ED Notes (Signed)
EDP at the bedside.  ?

## 2021-03-04 NOTE — ED Notes (Signed)
Social Work at the bedside 

## 2021-03-04 NOTE — ED Notes (Signed)
Pt sitting upright in bed requesting breakfast tray.

## 2021-03-04 NOTE — Progress Notes (Signed)
[facility] called .   Soumya Colson M.Fujie Dickison, MSW, LCSWA [facility]  Transitions of Care Clinical Social Worker I Direct Dial: [phone]  Fax: [phone] [email or web address]  

## 2021-03-04 NOTE — ED Notes (Signed)
Repositioned patient for comfort

## 2021-03-04 NOTE — ED Notes (Signed)
Pt given breakfast tray

## 2021-12-28 ENCOUNTER — Emergency Department (HOSPITAL_COMMUNITY)
Admission: EM | Admit: 2021-12-28 | Discharge: 2021-12-29 | Disposition: A | Discharge status: Home / Self Care | Payer: No Typology Code available for payment source | Attending: Emergency Medicine | Admitting: Emergency Medicine

## 2021-12-28 DIAGNOSIS — L298 Other pruritus: Secondary | ICD-10-CM | POA: Insufficient documentation

## 2021-12-28 DIAGNOSIS — I110 Hypertensive heart disease with heart failure: Secondary | ICD-10-CM | POA: Diagnosis not present

## 2021-12-28 DIAGNOSIS — I509 Heart failure, unspecified: Secondary | ICD-10-CM | POA: Insufficient documentation

## 2021-12-28 DIAGNOSIS — I2510 Atherosclerotic heart disease of native coronary artery without angina pectoris: Secondary | ICD-10-CM | POA: Diagnosis not present

## 2021-12-28 DIAGNOSIS — Z79899 Other long term (current) drug therapy: Secondary | ICD-10-CM | POA: Diagnosis not present

## 2021-12-28 DIAGNOSIS — I4891 Unspecified atrial fibrillation: Secondary | ICD-10-CM | POA: Insufficient documentation

## 2021-12-28 DIAGNOSIS — K5903 Drug induced constipation: Secondary | ICD-10-CM | POA: Diagnosis present

## 2021-12-28 DIAGNOSIS — R197 Diarrhea, unspecified: Secondary | ICD-10-CM | POA: Insufficient documentation

## 2021-12-28 NOTE — ED Triage Notes (Signed)
Pt arrives via [facility] from home antidiarrheal medicine for 3 weeks, has not had BM. Thinks she may have an allergic reaction d/t rash. Vitals 144/96, hr 85, 95% RA, cbg 181.

## 2021-12-29 ENCOUNTER — Other Ambulatory Visit: Payer: Self-pay

## 2021-12-29 ENCOUNTER — Encounter (HOSPITAL_COMMUNITY): Payer: Self-pay | Admitting: *Deleted

## 2021-12-29 ENCOUNTER — Emergency Department (HOSPITAL_COMMUNITY): Payer: No Typology Code available for payment source

## 2021-12-29 LAB — CBC WITH DIFFERENTIAL/PLATELET
Abs Immature Granulocytes: 0.08 10*3/uL — ABNORMAL HIGH (ref 0.00–0.07)
Basophils Absolute: 0.1 10*3/uL (ref 0.0–0.1)
Basophils Relative: 0 %
Eosinophils Absolute: 0.2 10*3/uL (ref 0.0–0.5)
Eosinophils Relative: 1 %
HCT: 45.5 % (ref 36.0–46.0)
Hemoglobin: 15.9 g/dL — ABNORMAL HIGH (ref 12.0–15.0)
Immature Granulocytes: 1 %
Lymphocytes Relative: 10 %
Lymphs Abs: 1.5 10*3/uL (ref 0.7–4.0)
MCH: 38.1 pg — ABNORMAL HIGH (ref 26.0–34.0)
MCHC: 34.9 g/dL (ref 30.0–36.0)
MCV: 109.1 fL — ABNORMAL HIGH (ref 80.0–100.0)
Monocytes Absolute: 0.8 10*3/uL (ref 0.1–1.0)
Monocytes Relative: 5 %
Neutro Abs: 12.9 10*3/uL — ABNORMAL HIGH (ref 1.7–7.7)
Neutrophils Relative %: 83 %
Platelets: 111 10*3/uL — ABNORMAL LOW (ref 150–400)
RBC: 4.17 MIL/uL (ref 3.87–5.11)
RDW: 14.3 % (ref 11.5–15.5)
WBC: 15.5 10*3/uL — ABNORMAL HIGH (ref 4.0–10.5)
nRBC: 0 % (ref 0.0–0.2)

## 2021-12-29 LAB — COMPREHENSIVE METABOLIC PANEL
ALT: 7 U/L (ref 0–44)
AST: 15 U/L (ref 15–41)
Albumin: 3.8 g/dL (ref 3.5–5.0)
Alkaline Phosphatase: 41 U/L (ref 38–126)
Anion gap: 11 (ref 5–15)
BUN: 20 mg/dL (ref 8–23)
CO2: 20 mmol/L — ABNORMAL LOW (ref 22–32)
Calcium: 8.7 mg/dL — ABNORMAL LOW (ref 8.9–10.3)
Chloride: 105 mmol/L (ref 98–111)
Creatinine, Ser: 1.82 mg/dL — ABNORMAL HIGH (ref 0.44–1.00)
GFR, Estimated: 29 mL/min — ABNORMAL LOW (ref >60)
Glucose, Bld: 219 mg/dL — ABNORMAL HIGH (ref 70–99)
Potassium: 3.4 mmol/L — ABNORMAL LOW (ref 3.5–5.1)
Sodium: 136 mmol/L (ref 135–145)
Total Bilirubin: 1.4 mg/dL — ABNORMAL HIGH (ref 0.3–1.2)
Total Protein: 6.9 g/dL (ref 6.5–8.1)

## 2021-12-29 LAB — URINALYSIS, ROUTINE W REFLEX MICROSCOPIC
Bilirubin Urine: NEGATIVE
Glucose, UA: NEGATIVE mg/dL
Ketones, ur: NEGATIVE mg/dL
Nitrite: NEGATIVE
Protein, ur: 30 mg/dL — AB
Specific Gravity, Urine: 1.012 (ref 1.005–1.030)
WBC, UA: >50 WBC/hpf — ABNORMAL HIGH (ref 0–5)
pH: 5 (ref 5.0–8.0)

## 2021-12-29 MED ORDER — CEPHALEXIN 500 MG PO CAPS: 500.0000 mg | ORAL_CAPSULE | Freq: Four times a day (QID) | ORAL | 0 refills | Status: AC

## 2021-12-29 NOTE — ED Provider Notes (Signed)
[facility] EMERGENCY DEPARTMENT Provider Note   CSN: [id number] Arrival date & time: 12/28/21  2347     History  Chief Complaint  Patient presents with   Pruritis   Constipation    Kaitlyn [name] is a 71 y.o. female with a past medical history of A-fib, hypertension, CHF, CAD status post MI, and diabetes presenting to the emergency department with concerns of constipation.  Patient reports that she started taking loperamide 3 times daily 3 weeks ago because she was having some diarrhea.  She states that she has stopped having bowel movements now.  She states her last bowel movement was 3 weeks ago.  She reports that she is getting a fullness feeling in her abdomen.  She denies any pruritus at this time.  She denies any nausea or vomiting at this time.  Patient denies having bedbugs.  Patient was seen to have bedbugs back in May 2022.  Patient has moved to new apartment.  There is concern of patient having social issues, patient lives alone, has trouble taking care of herself, has trouble walking.  She has refused social work in the past.  Currently patient has no complaints.   Constipation      Home Medications Prior to Admission medications   Medication Sig Start Date End Date Taking? Authorizing Provider  cephALEXin (KEFLEX) 500 MG capsule Take 1 capsule (500 mg total) by mouth 4 (four) times daily for 7 days. 12/29/21 01/05/22 Yes Modena Slater, DO  loperamide (IMODIUM) 2 MG capsule Take 4 mg by mouth 2 (two) times daily as needed for diarrhea or loose stools. 11/10/20   [provider]  metoprolol succinate (TOPROL-XL) 25 MG 24 hr tablet Take 1 tablet (25 mg total) by mouth daily. 09/16/20   Rhetta Mura, MD  apixaban (ELIQUIS) 5 MG TABS tablet Take 1 tablet (5 mg total) by mouth 2 (two) times daily. Patient not taking: No sig reported 04/19/19 04/30/20  Joseph Art, DO      Allergies    Caffeine, Cranberry, Lisinopril, Penicillins, Lactose  intolerance (gi), and Other    Review of Systems   Review of Systems  Gastrointestinal:  Negative for constipation.    Physical Exam Updated Vital Signs BP 92/67   Pulse 73   Temp 97.6 F (36.4 C) (Oral)   Resp 16   SpO2 100%  Physical Exam  General: Alert and orientated x3. Patient is resting comfortably in bed in no acute distress. Hair looks unkept.  Eyes: EOM intact  Head: Normocephalic, atraumatic  Cardio: Regular rate and rhythm, no murmurs, rubs or gallops. 2+ pulses to bilateral upper and lower extremities  Pulmonary: Clear to ausculation bilaterally with no rales, rhonchi, and crackles  Abdomen: Soft, nontender with normoactive bowel sounds with no rebound or guarding  Skin: Dry skin noted    ED Results / Procedures / Treatments   Labs (all labs ordered are listed, but only abnormal results are displayed) Labs Reviewed  CBC WITH DIFFERENTIAL/PLATELET - Abnormal; Notable for the following components:      Result Value   WBC 15.5 (*)    Hemoglobin 15.9 (*)    MCV 109.1 (*)    MCH 38.1 (*)    Platelets 111 (*)    Neutro Abs 12.9 (*)    Abs Immature Granulocytes 0.08 (*)    All other components within normal limits  COMPREHENSIVE METABOLIC PANEL - Abnormal; Notable for the following components:   Potassium 3.4 (*)    CO2  20 (*)    Glucose, Bld 219 (*)    Creatinine, Ser 1.82 (*)    Calcium 8.7 (*)    Total Bilirubin 1.4 (*)    GFR, Estimated 29 (*)    All other components within normal limits  URINALYSIS, ROUTINE W REFLEX MICROSCOPIC - Abnormal; Notable for the following components:   Color, Urine AMBER (*)    APPearance CLOUDY (*)    Hgb urine dipstick SMALL (*)    Protein, ur 30 (*)    Leukocytes,Ua MODERATE (*)    WBC, UA >50 (*)    Bacteria, UA MANY (*)    Non Squamous Epithelial 0-5 (*)    All other components within normal limits  URINE CULTURE    EKG None  Radiology DG ABD ACUTE 2+V W 1V CHEST  Result Date: 12/29/2021 CLINICAL DATA:   Constipation EXAM: DG ABDOMEN ACUTE WITH 1 VIEW CHEST COMPARISON:  None Available. FINDINGS: Portions of the abdomen are excluded from the exam. No dilated bowel loops in the visualized abdomen or pelvis. No definite obstruction. Large stool load in the left colon. No focal consolidation, pleural effusion, or pneumothorax. Normal cardiomediastinal silhouette. No acute osseous abnormality. IMPRESSION: Large stool load in the left colon. No acute cardiopulmonary disease. Electronically Signed   By: Minerva Fester M.D.   On: 12/29/2021 01:02    Procedures Procedures    Medications Ordered in ED Medications - No data to display  ED Course/ Medical Decision Making/ A&P                           Medical Decision Making This is a 71 year old female with a PMHx of Afib and Diabetes presenting to the department for concerns of constipation. Patient has been constipated for the past 3 weeks. She states that she had some watery stools 3 weeks ago, and decided to take loperamide for this. She states that she has not stopped taking it and now reports not having a BM for the past 3 weeks. She reports that she is is having a fullness sensation in her abdomen. Initial Xray did show large stool burden. On my exam, patient was having large bowel movement, and states that she felt better after the bowel movement. She states that she has no concerns now. She feels better. There was a a WBC of 15.5 and elevated Crt of 1.82. This Crt is likely due to do hydration. This WBC is likely from a UTI given that she wears diapers all the time and this could have caused her to have a UTI. UA studies pending. Of note, there is concern for patient being able to take care of her self but she is adamant about not having help and states that she can do everything herself. She talks to Child psychotherapist here and is adamant about not having help.   Amount and/or Complexity of Data Reviewed External Data Reviewed: notes. Labs:  ordered. Radiology: ordered.  Risk Prescription drug management.   1600: UA showing possible UTI. This is likely the reason there is elevated white count. Patient is stable to go home at this point. Patient will be discharged at this point and is stable for discharge. She will be sent home on keflex for treatment for UTI and will be instructed to go to her PCP for follow up on her white count.         Final Clinical Impression(s) / ED Diagnoses Final diagnoses:  Drug-induced constipation    Rx / DC Orders ED Discharge Orders          Ordered    cephALEXin (KEFLEX) 500 MG capsule  4 times daily        12/29/21 1604              Modena Slater, DO 12/29/21 1608    Jacalyn Lefevre, MD 12/30/21 2340

## 2021-12-29 NOTE — ED Notes (Signed)
Called [facility] for transport 10 infront of pt

## 2021-12-29 NOTE — ED Notes (Signed)
Patient transported to X-ray 

## 2021-12-29 NOTE — Progress Notes (Signed)
CSW spoke with patient who stated she doesn't want home health and stated she is independent and can do everything herself. CSW asked patient if she was interested in hiring an home aide as well. Patient also declined those services and stated she doesn't want anyone coming into her home. Patient stated she likes living alone. Patient stated her major concern is she needs someone to take out her trash. Patient stated that she had [facility] come out before and take care of that. CSW encouraged patient to call the senior resource center to see if they can assist her. CSW asked patient how much she brings in each month. Patient believes its $2100 and some change. CSW told patient she believes she might be over the limit for medicaid but CSW will reach out to [facility] financial. Patient stated that would be good and right now she just needs helps with her trash.   CSW contacted beverly with [employer] who stated she will review patients chart and complete the medicaid screening.

## 2021-12-29 NOTE — ED Provider Triage Note (Signed)
  Emergency Medicine Provider Triage Evaluation Note  MRN:  [id number]  Arrival date & time: 12/29/21    Medically screening exam initiated at 12:38 AM.   CC:   Pruritis and Constipation   HPI:  Kaitlyn [name] is a 71 y.o. year-old female presents to the ED with chief complaint of itching, rash, and constipation.  States she has not had a bowel movement in 3 weeks.  States she has been taking loperamide.  History provided by patient. ROS:  -As included in HPI PE:   Vitals:   12/29/21 0009  BP: 112/88  Pulse: (!) 58  Resp: 16  Temp: (!) 97.5 F (36.4 C)  SpO2: 95%    Non-toxic appearing No respiratory distress  MDM:  Based on signs and symptoms, constipation is highest on my differential. I've ordered labs and x-ray in triage to expedite lab/diagnostic workup.  Patient was informed that the remainder of the evaluation will be completed by another provider, this initial triage assessment does not replace that evaluation, and the importance of remaining in the ED until their evaluation is complete.    Roxy Horseman, PA-C 12/29/21 0038

## 2021-12-29 NOTE — ED Notes (Signed)
Pt had Large BM upon arrival to ED room.

## 2021-12-29 NOTE — ED Provider Notes (Signed)
I was asked to come evaluate the patient.  Patient had been seen by the morning team and was put for discharge.  I was notified by nursing that the patient was reluctant to go home.  I reviewed the patient's chart and did not see any obvious indication for admission.  It sounds like she had asymptomatic bacteriuria and was here for Imodium induced constipation.  She was prescribed Keflex and plan was for discharge.  The patient tells me that she wants to stay in sleep overnight.  She gives me really no other reason why she wants to stay in the hospital.  I do not feel that the risk of hospitalization is worth the minimal benefit to the patient.  I discussed this with her at length which she does not agree with.  D/c home.    Melene Plan, DO 12/29/21 1626

## 2021-12-29 NOTE — ED Triage Notes (Signed)
Pt says that she has been taking immodium and she has been constipated. States she is itching all over. Pt is disheveled, new gown and blanket given in triage.

## 2021-12-29 NOTE — Discharge Instructions (Addendum)
Kaitlyn [name], Thank you for allowing me to take part in your care today. Here is what we discussed today.  1. Regarding your constipation, you have been relieved here in the emergency department. You should stop taking the loperamide and this should be able to help you have regular bowel movements. Please do not take loperamide for more than a few days if you have loose stools.   2. Regarding your elevated white count, this is likely from a UTI, I will give you keflex to take. Please take this to your pharmacy. Please continue to stay hydrated.   3. If your develop fevers or chills, please return back to the emergency department to be evaluated.   Thank you, Dr. Allena Katz

## 2021-12-31 LAB — URINE CULTURE
Culture: >=100000 — AB
Special Requests: NORMAL

## 2022-01-01 ENCOUNTER — Telehealth: Payer: Self-pay | Admitting: *Deleted

## 2022-01-01 NOTE — Telephone Encounter (Signed)
Post ED Visit - Positive Culture Follow-up  Culture report reviewed by antimicrobial stewardship pharmacist: [facility] []  , Pharm.D. []  Enzo Bi, Pharm.D., BCPS AQ-ID []  , Pharm.D., BCPS []  Celedonio Miyamoto, Pharm.D., BCPS []  [place], Garvin Fila.D., BCPS, AAHIVP []  , Pharm.D., BCPS, AAHIVP []  Georgina Pillion, PharmD, BCPS []  , PharmD, BCPS []  [place], PharmD, BCPS []  [address], PharmD []  , PharmD, BCPS []  Estella Husk, PharmD  Pharmacy Team []  Lysle Pearl, PharmD []  , PharmD []  Phillips Climes, PharmD []  , Rph []  Agapito Games) , PharmD []  Verlan Friends, PharmD []  , PharmD []  Mervyn Gay, PharmD []  , PharmD []  Vinnie Level, PharmD []  [facility], PharmD []  , PharmD []  Len Childs, PharmD   Positive urine culture Treated with Cephalexin, organism sensitive to the same and no further patient follow-up is required at this time. Kaitlyn [name]. PharmD  [place] 01/01/2022, 2:27 PM

## 2022-01-14 ENCOUNTER — Emergency Department (HOSPITAL_COMMUNITY)
Admission: EM | Admit: 2022-01-14 | Discharge: 2022-01-14 | Disposition: A | Discharge status: Home / Self Care | Payer: No Typology Code available for payment source | Attending: Emergency Medicine | Admitting: Emergency Medicine

## 2022-01-14 ENCOUNTER — Emergency Department (HOSPITAL_COMMUNITY): Payer: No Typology Code available for payment source

## 2022-01-14 ENCOUNTER — Other Ambulatory Visit: Payer: Self-pay

## 2022-01-14 ENCOUNTER — Encounter (HOSPITAL_COMMUNITY): Payer: Self-pay

## 2022-01-14 DIAGNOSIS — K5903 Drug induced constipation: Secondary | ICD-10-CM | POA: Diagnosis not present

## 2022-01-14 DIAGNOSIS — I509 Heart failure, unspecified: Secondary | ICD-10-CM | POA: Diagnosis not present

## 2022-01-14 DIAGNOSIS — E119 Type 2 diabetes mellitus without complications: Secondary | ICD-10-CM | POA: Insufficient documentation

## 2022-01-14 DIAGNOSIS — K5641 Fecal impaction: Secondary | ICD-10-CM

## 2022-01-14 DIAGNOSIS — I110 Hypertensive heart disease with heart failure: Secondary | ICD-10-CM | POA: Insufficient documentation

## 2022-01-14 DIAGNOSIS — K5900 Constipation, unspecified: Secondary | ICD-10-CM | POA: Diagnosis present

## 2022-01-14 LAB — CBC WITH DIFFERENTIAL/PLATELET
Abs Immature Granulocytes: 0.04 10*3/uL (ref 0.00–0.07)
Basophils Absolute: 0.1 10*3/uL (ref 0.0–0.1)
Basophils Relative: 1 %
Eosinophils Absolute: 0.1 10*3/uL (ref 0.0–0.5)
Eosinophils Relative: 2 %
HCT: 40.6 % (ref 36.0–46.0)
Hemoglobin: 13.5 g/dL (ref 12.0–15.0)
Immature Granulocytes: 1 %
Lymphocytes Relative: 20 %
Lymphs Abs: 1.5 10*3/uL (ref 0.7–4.0)
MCH: 38 pg — ABNORMAL HIGH (ref 26.0–34.0)
MCHC: 33.3 g/dL (ref 30.0–36.0)
MCV: 114.4 fL — ABNORMAL HIGH (ref 80.0–100.0)
Monocytes Absolute: 0.4 10*3/uL (ref 0.1–1.0)
Monocytes Relative: 6 %
Neutro Abs: 5.2 10*3/uL (ref 1.7–7.7)
Neutrophils Relative %: 70 %
Platelets: 94 10*3/uL — ABNORMAL LOW (ref 150–400)
RBC: 3.55 MIL/uL — ABNORMAL LOW (ref 3.87–5.11)
RDW: 14 % (ref 11.5–15.5)
WBC: 7.2 10*3/uL (ref 4.0–10.5)
nRBC: 0 % (ref 0.0–0.2)

## 2022-01-14 LAB — COMPREHENSIVE METABOLIC PANEL
ALT: 7 U/L (ref 0–44)
AST: 18 U/L (ref 15–41)
Albumin: 3.9 g/dL (ref 3.5–5.0)
Alkaline Phosphatase: 44 U/L (ref 38–126)
Anion gap: 7 (ref 5–15)
BUN: 19 mg/dL (ref 8–23)
CO2: 27 mmol/L (ref 22–32)
Calcium: 8.7 mg/dL — ABNORMAL LOW (ref 8.9–10.3)
Chloride: 104 mmol/L (ref 98–111)
Creatinine, Ser: 1.66 mg/dL — ABNORMAL HIGH (ref 0.44–1.00)
GFR, Estimated: 33 mL/min — ABNORMAL LOW (ref >60)
Glucose, Bld: 89 mg/dL (ref 70–99)
Potassium: 3.6 mmol/L (ref 3.5–5.1)
Sodium: 138 mmol/L (ref 135–145)
Total Bilirubin: 1.6 mg/dL — ABNORMAL HIGH (ref 0.3–1.2)
Total Protein: 7.2 g/dL (ref 6.5–8.1)

## 2022-01-14 MED ORDER — SORBITOL 70 % SOLN
960.0000 mL | TOPICAL_OIL | Freq: Once | ORAL | Status: AC
  Administered 2022-01-14: 960 mL via RECTAL
  Filled 2022-01-14: qty 473

## 2022-01-14 NOTE — ED Provider Notes (Signed)
[facility]-EMERGENCY DEPT Provider Note   CSN: [id number] Arrival date & time: 01/14/22  0017     History  Chief Complaint  Patient presents with   Constipation    Kaitlyn [name] is a 71 y.o. female.  Pt is a 71 yo female with a pmhx significant for chf, afib (not on anticoagulation due to falls), seizures, htn, dm2, and ambulatory dysfunction.  Pt was seen by me on 8/14 for drug induced constipation.  She was also seen by sw, but refused any kind of home health.  Pt lives alone and is confined to a wheelchair.  She has been taking imodium daily whether or not she has a stool.  It sounds like it is difficult to get to the bathroom due to the wheelchair.  Pt now feels like she has stool that is stuck.  She has been attempting disimpaction manually.  Pt displays poor hygiene.       Home Medications Prior to Admission medications   Medication Sig Start Date End Date Taking? Authorizing Provider  loperamide (IMODIUM) 2 MG capsule Take 4 mg by mouth 2 (two) times daily as needed for diarrhea or loose stools. 11/10/20   [provider]  metoprolol succinate (TOPROL-XL) 25 MG 24 hr tablet Take 1 tablet (25 mg total) by mouth daily. 09/16/20   Rhetta Mura, MD  apixaban (ELIQUIS) 5 MG TABS tablet Take 1 tablet (5 mg total) by mouth 2 (two) times daily. Patient not taking: No sig reported 04/19/19 04/30/20  Joseph Art, DO      Allergies    Caffeine, Cranberry, Lisinopril, Penicillins, Lactose intolerance (gi), and Other    Review of Systems   Review of Systems  Gastrointestinal:  Positive for constipation.  All other systems reviewed and are negative.   Physical Exam Updated Vital Signs BP 121/89 (BP Location: Left Arm)   Pulse (!) 51   Temp 98.6 F (37 C) (Oral)   Resp 18   Ht 5\' 11"  (1.803 m)   Wt 127 kg   SpO2 92%   BMI 39.05 kg/m  Physical Exam Vitals and nursing note reviewed.  Constitutional:      Appearance: Normal  appearance.  HENT:     Head: Normocephalic and atraumatic.     Right Ear: External ear normal.     Left Ear: External ear normal.     Nose: Nose normal.     Mouth/Throat:     Mouth: Mucous membranes are moist.     Pharynx: Oropharynx is clear.  Eyes:     Extraocular Movements: Extraocular movements intact.     Conjunctiva/sclera: Conjunctivae normal.     Pupils: Pupils are equal, round, and reactive to light.  Cardiovascular:     Rate and Rhythm: Normal rate and regular rhythm.     Pulses: Normal pulses.     Heart sounds: Normal heart sounds.  Pulmonary:     Effort: Pulmonary effort is normal.     Breath sounds: Normal breath sounds.  Abdominal:     General: Abdomen is flat. Bowel sounds are decreased.     Palpations: Abdomen is soft.  Genitourinary:    Comments: Stool in rectum disimpacted.  Pt has dried stool all over legs and hands. Musculoskeletal:        General: Normal range of motion.     Cervical back: Normal range of motion and neck supple.  Skin:    General: Skin is warm.     Capillary Refill: Capillary  refill takes less than 2 seconds.  Neurological:     General: No focal deficit present.     Mental Status: She is alert and oriented to person, place, and time.  Psychiatric:        Mood and Affect: Mood normal.        Behavior: Behavior normal.     ED Results / Procedures / Treatments   Labs (all labs ordered are listed, but only abnormal results are displayed) Labs Reviewed  COMPREHENSIVE METABOLIC PANEL - Abnormal; Notable for the following components:      Result Value   Creatinine, Ser 1.66 (*)    Calcium 8.7 (*)    Total Bilirubin 1.6 (*)    GFR, Estimated 33 (*)    All other components within normal limits  CBC WITH DIFFERENTIAL/PLATELET - Abnormal; Notable for the following components:   RBC 3.55 (*)    MCV 114.4 (*)    MCH 38.0 (*)    Platelets 94 (*)    All other components within normal limits  URINALYSIS, ROUTINE W REFLEX MICROSCOPIC     EKG None  Radiology DG Abdomen 1 View  Result Date: 01/14/2022 CLINICAL DATA:  Constipation EXAM: ABDOMEN - 1 VIEW COMPARISON:  Sixteen days ago FINDINGS: Very limited study, considered nondiagnostic, due to underpenetration and image blurring. No visible gas dilated bowel or abnormal stool. IMPRESSION: Nondiagnostic exam.  No visible constipation. Electronically Signed   By: Tiburcio Pea M.D.   On: 01/14/2022 06:11    Procedures Fecal disimpaction  Date/Time: 01/14/2022 4:25 PM  Performed by: Jacalyn Lefevre, MD Authorized by: Jacalyn Lefevre, MD  Consent: Verbal consent obtained. Patient identity confirmed: verbally with patient Local anesthesia used: no  Anesthesia: Local anesthesia used: no  Sedation: Patient sedated: no  Patient tolerance: patient tolerated the procedure well with no immediate complications       Medications Ordered in ED Medications  sorbitol, milk of mag, mineral oil, glycerin (SMOG) enema (960 mLs Rectal Given 01/14/22 1352)    ED Course/ Medical Decision Making/ A&P                           Medical Decision Making Amount and/or Complexity of Data Reviewed Labs: ordered. Radiology: ordered.   This patient presents to the ED for concern of constipation, this involves an extensive number of treatment options, and is a complaint that carries with it a high risk of complications and morbidity.  The differential diagnosis includes constipation, obstruction   Co morbidities that complicate the patient evaluation  chf, afib (not on anticoagulation due to falls), seizures, htn, dm2, and ambulatory dysfunction.   Additional history obtained:  Additional history obtained from epic chart review   Lab Tests:  I Ordered, and personally interpreted labs.  The pertinent results include:  cbc nl other than plt low at 94; cr 1.66 which is improved from 1.82 on 8/15   Imaging Studies ordered:  I ordered imaging studies including kub  I  independently visualized and interpreted imaging which showed  IMPRESSION:  Nondiagnostic exam.  No visible constipation.   I agree with the radiologist interpretation   Cardiac Monitoring:  The patient was maintained on a cardiac monitor.  I personally viewed and interpreted the cardiac monitored which showed an underlying rhythm of: nsr   Medicines ordered and prescription drug management:  I ordered medication including smog enema  for constipation  Reevaluation of the patient after these medicines showed that  the patient improved I have reviewed the patients home medicines and have made adjustments as needed  Critical Interventions:  Disimpaction/enema   Problem List / ED Course:  Fecal impaction/constipation:  likely due to imodium usage.  Pt is told that she is not to take the imodium unless a doctor tells her it is ok.  Pt is eating/drinking.  She is stable for d/c.  She is to return if worse.    Reevaluation:  After the interventions noted above, I reevaluated the patient and found that they have :improved   Social Determinants of Health:  Lives alone.  She has refused home health as recently as 2 weeks ago.   Dispostion:  After consideration of the diagnostic results and the patients response to treatment, I feel that the patent would benefit from discharge with outpatient f/u.          Final Clinical Impression(s) / ED Diagnoses Final diagnoses:  Drug-induced constipation  Fecal impaction [facility])    Rx / DC Orders ED Discharge Orders     None         Jacalyn Lefevre, MD 01/14/22 [phone]

## 2022-01-14 NOTE — ED Notes (Signed)
Changed pt in to a clean gown and socks- original gown and socks were disposed

## 2022-01-14 NOTE — ED Notes (Signed)
[facility] notified of pt need for transportation

## 2022-01-14 NOTE — ED Triage Notes (Signed)
Arrives EMS from home with constipation x 3 days. Prescribed miralax but has not taken it.   Has taken an "absurd" amount of imodium.   Attempting to disimpact self.

## 2022-01-14 NOTE — Discharge Instructions (Addendum)
DO NOT take loperamide (imodium) again unless a doctor tells you that it is ok.

## 2022-01-22 ENCOUNTER — Emergency Department (HOSPITAL_COMMUNITY)
Admission: EM | Admit: 2022-01-22 | Discharge: 2022-01-26 | Disposition: A | Discharge status: Home / Self Care | Payer: No Typology Code available for payment source | Attending: Emergency Medicine | Admitting: Emergency Medicine

## 2022-01-22 ENCOUNTER — Other Ambulatory Visit: Payer: Self-pay

## 2022-01-22 ENCOUNTER — Encounter (HOSPITAL_COMMUNITY): Payer: Self-pay

## 2022-01-22 DIAGNOSIS — Z0000 Encounter for general adult medical examination without abnormal findings: Secondary | ICD-10-CM | POA: Diagnosis not present

## 2022-01-22 DIAGNOSIS — I4891 Unspecified atrial fibrillation: Secondary | ICD-10-CM

## 2022-01-22 DIAGNOSIS — R000 Tachycardia, unspecified: Secondary | ICD-10-CM | POA: Insufficient documentation

## 2022-01-22 LAB — PHOSPHORUS: Phosphorus: 3 mg/dL (ref 2.5–4.6)

## 2022-01-22 LAB — CBC WITH DIFFERENTIAL/PLATELET
Abs Immature Granulocytes: 0.05 10*3/uL (ref 0.00–0.07)
Basophils Absolute: 0.1 10*3/uL (ref 0.0–0.1)
Basophils Relative: 1 %
Eosinophils Absolute: 0.2 10*3/uL (ref 0.0–0.5)
Eosinophils Relative: 3 %
HCT: 40.3 % (ref 36.0–46.0)
Hemoglobin: 13.5 g/dL (ref 12.0–15.0)
Immature Granulocytes: 1 %
Lymphocytes Relative: 16 %
Lymphs Abs: 1.4 10*3/uL (ref 0.7–4.0)
MCH: 38.6 pg — ABNORMAL HIGH (ref 26.0–34.0)
MCHC: 33.5 g/dL (ref 30.0–36.0)
MCV: 115.1 fL — ABNORMAL HIGH (ref 80.0–100.0)
Monocytes Absolute: 0.5 10*3/uL (ref 0.1–1.0)
Monocytes Relative: 6 %
Neutro Abs: 6 10*3/uL (ref 1.7–7.7)
Neutrophils Relative %: 73 %
Platelets: 81 10*3/uL — ABNORMAL LOW (ref 150–400)
RBC: 3.5 MIL/uL — ABNORMAL LOW (ref 3.87–5.11)
RDW: 14.1 % (ref 11.5–15.5)
WBC: 8.2 10*3/uL (ref 4.0–10.5)
nRBC: 0 % (ref 0.0–0.2)

## 2022-01-22 LAB — BASIC METABOLIC PANEL
Anion gap: 13 (ref 5–15)
BUN: 31 mg/dL — ABNORMAL HIGH (ref 8–23)
CO2: 24 mmol/L (ref 22–32)
Calcium: 8.6 mg/dL — ABNORMAL LOW (ref 8.9–10.3)
Chloride: 102 mmol/L (ref 98–111)
Creatinine, Ser: 1.64 mg/dL — ABNORMAL HIGH (ref 0.44–1.00)
GFR, Estimated: 33 mL/min — ABNORMAL LOW (ref >60)
Glucose, Bld: 87 mg/dL (ref 70–99)
Potassium: 3.7 mmol/L (ref 3.5–5.1)
Sodium: 139 mmol/L (ref 135–145)

## 2022-01-22 LAB — MAGNESIUM: Magnesium: 3 mg/dL — ABNORMAL HIGH (ref 1.7–2.4)

## 2022-01-22 MED ORDER — ACETAMINOPHEN 325 MG PO TABS: 650.0000 mg | ORAL_TABLET | ORAL | Status: DC | PRN

## 2022-01-22 MED ORDER — METOPROLOL TARTRATE 25 MG PO TABS
25.0000 mg | ORAL_TABLET | Freq: Once | ORAL | Status: AC
  Administered 2022-01-22: 25 mg via ORAL
  Filled 2022-01-22: qty 1

## 2022-01-22 MED ORDER — LACTATED RINGERS IV BOLUS
1000.0000 mL | Freq: Once | INTRAVENOUS | Status: AC
  Administered 2022-01-22: 1000 mL via INTRAVENOUS

## 2022-01-22 NOTE — Evaluation (Signed)
Physical Therapy Evaluation Patient Details Name: Kaitlyn [name] MRN: [id number] DOB: 04-Aug-1950 Today's Date: 01/22/2022  History of Present Illness  Pt is a 71 year old female seen in the ED. " APS called out for pt to be brought to hospital and evaluated to potentially be placed at SNF. Per EMS pt was sitting on recliner covered in urine. Pt HR 140s in triage hx afib states was taken off meds" PMHx: afib, DM2, CHF, MI, CAD, ambulatory dysfunction, seizure  Clinical Impression  Pt admitted with above diagnosis.  Pt currently with functional limitations due to the deficits listed below (see PT Problem List). Pt will benefit from skilled PT to increase their independence and safety with mobility to allow discharge to the venue listed below.  Pt close to her mobility baseline as she has been transferring out of lift recliner to rolling office chair for a long time.  Pt states her lift recliner is broken however so she is not able to mobilize at home independently and calling EMS/fire dept for assist.  Pt will either need assist for obtaining new lift recliner or placement.  Pt reports she cannot afford to pay for a new lift recliner.        Recommendations for follow up therapy are one component of a multi-disciplinary discharge planning process, led by the attending physician.  Recommendations may be updated based on patient status, additional functional criteria and insurance authorization.  Follow Up Recommendations Skilled nursing-short term rehab (<3 hours/day) Can patient physically be transported by private vehicle: No    Assistance Recommended at Discharge    Patient can return home with the following  A little help with walking and/or transfers;A little help with bathing/dressing/bathroom;Assist for transportation;Help with stairs or ramp for entrance    Equipment Recommendations None recommended by PT  Recommendations for Other Services       Functional Status Assessment Patient  has had a recent decline in their functional status and demonstrates the ability to make significant improvements in function in a reasonable and predictable amount of time.     Precautions / Restrictions Precautions Precautions: Fall Restrictions Weight Bearing Restrictions: No      Mobility  Bed Mobility Overal bed mobility: Needs Assistance Bed Mobility: Supine to Sit, Sit to Supine     Supine to sit: Supervision, HOB elevated Sit to supine: Supervision, HOB elevated        Transfers Overall transfer level: Needs assistance Equipment used: Rolling walker (2 wheels) Transfers: Sit to/from Stand, Bed to chair/wheelchair/BSC Sit to Stand: Mod assist, +2 safety/equipment Stand pivot transfers: Mod assist, +2 safety/equipment         General transfer comment: cues for safety, pt typically transfers to rolling office chair, transferred to recliner chair today, requiring mod assist due to weakness, pt reports difficulty with sit to stand and needs higher surface    Ambulation/Gait                  Stairs            Wheelchair Mobility    Modified Rankin (Stroke Patients Only)       Balance Overall balance assessment: Needs assistance                                           Pertinent Vitals/Pain Pain Assessment Pain Assessment: Faces Faces Pain Scale: Hurts little more Pain  Location: back with positioning Pain Descriptors / Indicators: Discomfort Pain Intervention(s): Repositioned    Home Living Family/patient expects to be discharged to:: Private residence Living Arrangements: Alone   Type of Home: Apartment Home Access: Level entry       Home Layout: One level Home Equipment: Agricultural consultant (2 wheels);Wheelchair - manual;BSC/3in1      Prior Function Prior Level of Function : Needs assist             Mobility Comments: lift recliner broken, doesn't ambulate, typically uses lift chair to a rolling office chair  to roll around apt, can pivot ADLs Comments: uses a lot of briefs. has grocery delivery, sponge bathing, grossly independent but with poor hygiene     Hand Dominance   Dominant Hand: Left    Extremity/Trunk Assessment   Upper Extremity Assessment Upper Extremity Assessment: Generalized weakness    Lower Extremity Assessment Lower Extremity Assessment: Defer to PT evaluation    Cervical / Trunk Assessment Cervical / Trunk Assessment: Kyphotic  Communication   Communication: No difficulties  Cognition Arousal/Alertness: Awake/alert Behavior During Therapy: WFL for tasks assessed/performed Overall Cognitive Status: Within Functional Limits for tasks assessed                                          General Comments      Exercises     Assessment/Plan    PT Assessment Patient needs continued PT services  PT Problem List Decreased balance;Decreased strength;Decreased mobility;Decreased activity tolerance;Decreased safety awareness       PT Treatment Interventions DME instruction;Therapeutic activities;Stair training;Functional mobility training;Neuromuscular re-education;Balance training;Therapeutic exercise;Patient/family education;Wheelchair mobility training    PT Goals (Current goals can be found in the Care Plan section)  Acute Rehab PT Goals PT Goal Formulation: With patient Time For Goal Achievement: 02/05/22 Potential to Achieve Goals: Fair    Frequency Min 2X/week     Co-evaluation PT/OT/SLP Co-Evaluation/Treatment: Yes Reason for Co-Treatment: To address functional/ADL transfers;For patient/therapist safety PT goals addressed during session: Mobility/safety with mobility OT goals addressed during session: ADL's and self-care       AM-PAC PT "6 Clicks" Mobility  Outcome Measure Help needed turning from your back to your side while in a flat bed without using bedrails?: A Little Help needed moving from lying on your back to sitting on  the side of a flat bed without using bedrails?: A Little Help needed moving to and from a bed to a chair (including a wheelchair)?: A Lot Help needed standing up from a chair using your arms (e.g., wheelchair or bedside chair)?: A Lot Help needed to walk in hospital room?: A Lot Help needed climbing 3-5 steps with a railing? : Total 6 Click Score: 13    End of Session   Activity Tolerance: Patient tolerated treatment well Patient left: in bed;with call bell/phone within reach Nurse Communication: Mobility status PT Visit Diagnosis: Muscle weakness (generalized) (M62.81)    Time: 6384-5364 PT Time Calculation (min) (ACUTE ONLY): 24 min   Charges:   PT Evaluation $PT Eval Moderate Complexity: 1 Mod        Kati PT, DPT Physical Therapist Acute Rehabilitation Services Preferred contact method: Secure Chat Weekend Pager Only: [phone] Office: [phone]   Janan Halter [place] 01/22/2022, 5:06 PM

## 2022-01-22 NOTE — Social Work (Signed)
CSW spoke to Pt, Pt lives alone, Pt can not be discharged as her wheelchair is broke. The Pt may not qualify for SNF CSW still tried to see if she can go. CSW made other providers and leadership know about this issue.

## 2022-01-22 NOTE — Social Work (Signed)
CSW spoke with Pt, Pt lives alone and has no family in the area. Pt states that she needs help getting medicare. Pt is willing to go to a SNF CSW has started the process.

## 2022-01-22 NOTE — Evaluation (Signed)
Occupational Therapy Evaluation Patient Details Name: Kaitlyn [name] MRN: [id number] DOB: 05/27/1950 Today's Date: 01/22/2022   History of Present Illness Pt is a 71 year old female seen in the ED. " APS called out for pt to be brought to hospital and evaluated to potentially be placed at SNF. Per EMS pt was sitting on recliner covered in urine. Pt HR 140s in triage hx afib states was taken off meds" PMHx: afib, DM2, CHF, MI, CAD, ambulatory dysfunction, seizure   Clinical Impression   Kaitlyn [name] is a 71 year old woman who lives alone and is highly reliant on her lift chair. She is able to stand and transfer with use of lift chair to a rolling office chair to get around her apartment. She is grossly functional from a seated level - though she exhibits an unkempt look - her hair is matted and her nails dirty and reports getting to the toilet once a day and heavily reliant on diapers.  Her lift chair is broke and unable to get out of the chair or stand resulting in the need to call EMS. She now presents in the ED. Patient able to transfer to edge of stretcher with min guard and pivot to chair. She is mod assist to power up from chair level to get back into stretcher. She needs assistance for ADLs. Patient will benefit from skilled OT services while in hospital to improve deficits and learn compensatory strategies as needed in order to return to PLOF. Patient cannot go home in current condition. She either needs a new lift chair or needs to go to rehab for strengthening in order to be able to stand from different level surface. Patient cannot afford lift chair. Patient needs short term rehab and may benefit from custodial care in the future.      Recommendations for follow up therapy are one component of a multi-disciplinary discharge planning process, led by the attending physician.  Recommendations may be updated based on patient status, additional functional criteria and insurance authorization.    Follow Up Recommendations  Skilled nursing-short term rehab (<3 hours/day)    Assistance Recommended at Discharge Frequent or constant Supervision/Assistance  Patient can return home with the following A little help with walking and/or transfers;A little help with bathing/dressing/bathroom;Assistance with cooking/housework;Help with stairs or ramp for entrance    Functional Status Assessment  Patient has had a recent decline in their functional status and demonstrates the ability to make significant improvements in function in a reasonable and predictable amount of time.  Equipment Recommendations  Other (comment) (Defer)    Recommendations for Other Services       Precautions / Restrictions Precautions Precautions: Fall Restrictions Weight Bearing Restrictions: No         Balance Overall balance assessment: Needs assistance Sitting-balance support: No upper extremity supported, Feet supported Sitting balance-Leahy Scale: Good     Standing balance support: During functional activity Standing balance-Leahy Scale: Poor                             ADL either performed or assessed with clinical judgement   ADL Overall ADL's : Needs assistance/impaired Eating/Feeding: Independent   Grooming: Set up;Sitting   Upper Body Bathing: Sitting;Set up   Lower Body Bathing: Maximal assistance;Sit to/from stand   Upper Body Dressing : Set up;Sitting   Lower Body Dressing: Sit to/from stand;Moderate assistance   Toilet Transfer: Moderate assistance;BSC/3in1   ToiletingTeacher, music  and Hygiene: Moderate assistance;Sit to/from stand Toileting - Clothing Manipulation Details (indicate cue type and reason): for clothing management     Functional mobility during ADLs: Moderate assistance General ADL Comments: Mod assist for sit to stand     Vision Baseline Vision/History: 1 Wears glasses Patient Visual Report: No change from baseline        Perception     Praxis      Pertinent Vitals/Pain Pain Assessment Pain Assessment: Faces Faces Pain Scale: Hurts little more Pain Location: back with positioning Pain Descriptors / Indicators: Discomfort Pain Intervention(s): Repositioned     Hand Dominance Left   Extremity/Trunk Assessment Upper Extremity Assessment Upper Extremity Assessment: Generalized weakness   Lower Extremity Assessment Lower Extremity Assessment: Defer to PT evaluation   Cervical / Trunk Assessment Cervical / Trunk Assessment: Kyphotic   Communication Communication Communication: No difficulties   Cognition Arousal/Alertness: Awake/alert Behavior During Therapy: WFL for tasks assessed/performed Overall Cognitive Status: Within Functional Limits for tasks assessed                                       General Comments       Exercises     Shoulder Instructions      Home Living Family/patient expects to be discharged to:: Private residence Living Arrangements: Alone   Type of Home: Apartment Home Access: Level entry     Home Layout: One level               Home Equipment: Agricultural consultant (2 wheels);Wheelchair - manual;BSC/3in1          Prior Functioning/Environment Prior Level of Function : Needs assist             Mobility Comments: lift recliner broken, doesn't ambulate, typically uses lift chair to a rolling office chair to roll around apt, can pivot ADLs Comments: uses a lot of briefs. has grocery delivery, sponge bathing, grossly independent but with poor hygiene        OT Problem List: Decreased strength;Decreased activity tolerance;Impaired balance (sitting and/or standing);Decreased knowledge of use of DME or AE;Pain;Decreased safety awareness      OT Treatment/Interventions: Self-care/ADL training;Therapeutic exercise;DME and/or AE instruction;Therapeutic activities;Balance training;Patient/family education    OT Goals(Current goals can be  found in the care plan section) Acute Rehab OT Goals Patient Stated Goal: to be able to urinate OT Goal Formulation: With patient Time For Goal Achievement: 02/05/22 Potential to Achieve Goals: Fair  OT Frequency: Min 2X/week    Co-evaluation   Reason for Co-Treatment: To address functional/ADL transfers;For patient/therapist safety PT goals addressed during session: Mobility/safety with mobility OT goals addressed during session: ADL's and self-care      AM-PAC OT "6 Clicks" Daily Activity     Outcome Measure Help from another person eating meals?: None Help from another person taking care of personal grooming?: A Little Help from another person toileting, which includes using toliet, bedpan, or urinal?: A Lot Help from another person bathing (including washing, rinsing, drying)?: A Lot Help from another person to put on and taking off regular upper body clothing?: A Little Help from another person to put on and taking off regular lower body clothing?: A Lot 6 Click Score: 16   End of Session Equipment Utilized During Treatment: Rolling walker (2 wheels) Nurse Communication:  (patient wanting a new purewick. Patient diaper dry, urine container empty and patient reporting not urinating since  she has been in ER(1030am))  Activity Tolerance: Patient tolerated treatment well Patient left: in bed;with call bell/phone within reach  OT Visit Diagnosis: Muscle weakness (generalized) (M62.81);Unsteadiness on feet (R26.81)                Time: 8341-9622 OT Time Calculation (min): 25 min Charges:  OT General Charges $OT Visit: 1 Visit OT Evaluation $OT Eval Low Complexity: 1 Low  Donnella Sham, OTR/L Acute Care Rehab Services  Office [phone]   Kelli Churn 01/22/2022, 5:15 PM

## 2022-01-22 NOTE — ED Provider Notes (Signed)
[facility]-EMERGENCY DEPT Provider Note   CSN: [id number] Arrival date & time: 01/22/22  1109     History  Chief Complaint  Patient presents with   medical screening    Kaitlyn [name] is a 71 y.o. female.  Patient is a 71 year old female with a past medical history of A-fib not on medication and wheelchair-bound presenting to the emergency department for inability to eat or drink over the last 2 days.  The patient states that she lives at home alone and has a motorized chair that helps her transfer into her wheelchair.  She states that her chair broke and she was unable to get out of the chair the last few days.  She states that she has not had anything to eat or drink the last 2 days.  She denies any fevers or chills, nausea or vomiting, chest pain or abdominal pain.  Dates that she talk to her primary doctor about going to a nursing home but was told that her heart rate was high and recommended that she come to the emergency department.  She denies any chest pain, palpitations or shortness of breath.  The history is provided by the patient.       Home Medications Prior to Admission medications   Medication Sig Start Date End Date Taking? Authorizing Provider  loperamide (IMODIUM) 2 MG capsule Take 4 mg by mouth 2 (two) times daily as needed for diarrhea or loose stools. 11/10/20   [provider]  metoprolol succinate (TOPROL-XL) 25 MG 24 hr tablet Take 1 tablet (25 mg total) by mouth daily. 09/16/20   Rhetta Mura, MD  apixaban (ELIQUIS) 5 MG TABS tablet Take 1 tablet (5 mg total) by mouth 2 (two) times daily. Patient not taking: No sig reported 04/19/19 04/30/20  Joseph Art, DO      Allergies    Caffeine, Cranberry, Lisinopril, Penicillins, Lactose intolerance (gi), and Other    Review of Systems   Review of Systems  Physical Exam Updated Vital Signs BP (!) 128/99   Pulse 100   Temp 98.6 F (37 C) (Oral)   Resp 12   Ht 5\' 11"   (1.803 m)   Wt 127 kg   SpO2 90%   BMI 39.05 kg/m  Physical Exam Vitals and nursing note reviewed.  Constitutional:      General: She is not in acute distress.    Appearance: Normal appearance.  HENT:     Head: Normocephalic and atraumatic.     Nose: Nose normal.     Mouth/Throat:     Mouth: Mucous membranes are moist.     Pharynx: Oropharynx is clear.  Eyes:     Extraocular Movements: Extraocular movements intact.     Conjunctiva/sclera: Conjunctivae normal.     Pupils: Pupils are equal, round, and reactive to light.  Cardiovascular:     Rate and Rhythm: Tachycardia present. Rhythm irregular.     Pulses: Normal pulses.     Heart sounds: Normal heart sounds.  Pulmonary:     Effort: Pulmonary effort is normal.     Breath sounds: Normal breath sounds.  Abdominal:     General: Abdomen is flat.     Palpations: Abdomen is soft.     Tenderness: There is no abdominal tenderness.  Musculoskeletal:        General: Normal range of motion.     Cervical back: Normal range of motion and neck supple.  Skin:    General: Skin is warm and  dry.  Neurological:     General: No focal deficit present.     Mental Status: She is alert and oriented to person, place, and time.  Psychiatric:        Mood and Affect: Mood normal.        Behavior: Behavior normal.     ED Results / Procedures / Treatments   Labs (all labs ordered are listed, but only abnormal results are displayed) Labs Reviewed  CBC WITH DIFFERENTIAL/PLATELET - Abnormal; Notable for the following components:      Result Value   RBC 3.50 (*)    MCV 115.1 (*)    MCH 38.6 (*)    Platelets 81 (*)    All other components within normal limits  BASIC METABOLIC PANEL - Abnormal; Notable for the following components:   BUN 31 (*)    Creatinine, Ser 1.64 (*)    Calcium 8.6 (*)    GFR, Estimated 33 (*)    All other components within normal limits  MAGNESIUM - Abnormal; Notable for the following components:   Magnesium 3.0 (*)     All other components within normal limits  PHOSPHORUS    EKG EKG Interpretation  Date/Time:  Friday January 22 2022 11:30:45 EDT Ventricular Rate:  154 PR Interval:    QRS Duration: 98 QT Interval:  271 QTC Calculation: 434 R Axis:   -15 Text Interpretation: Atrial fibrillation with rapid V-rate Borderline left axis deviation Low voltage, extremity and precordial leads Repolarization abnormality, prob rate related Rapid rate compared to prior EKG, otherwise no significant changes Confirmed by Arturo Morton ([id number]) on 01/22/2022 12:24:20 PM  Radiology No results found.  Procedures Procedures    Medications Ordered in ED Medications  acetaminophen (TYLENOL) tablet 650 mg (has no administration in time range)  lactated ringers bolus 1,000 mL (0 mLs Intravenous Stopped 01/22/22 1356)  metoprolol tartrate (LOPRESSOR) tablet 25 mg (25 mg Oral Given 01/22/22 1355)    ED Course/ Medical Decision Making/ A&P Clinical Course as of 01/22/22 1723  Fri Jan 22, 2022  1313 Labs reviewed and interpreted by myself.  Creatinine is at baseline and she has no signs of severe dehydration.  The patient remains tachycardic despite fluids and will be given a dose of metoprolol.  She states that she was taken off her metoprolol by her PCP for being rate controlled several months ago. [VK]  1318 Patient's HR improved to 110s and occasionally will go up to 140s with movement. Patient has received about 500 mL of fluids. She will be given PO metoprolol with completion of her IVF and SW will be consulted. [VK]  1723 SW states the patient's insurance will not cover SNF.  Mary attempting to replace the patient's chair and attempting to get her Medicare to help assist with SNF placement.  Patient is disposition is pending placement versus chair for safe discharge home. [VK]    Clinical Course User Index [VK] Phoebe Sharps, DO                           Medical Decision Making This patient presents  to the ED with chief complaint(s) of tachycardia and inability to care for self at home with pertinent past medical history of A-fib and wheelchair-bound at baseline which further complicates the presenting complaint. The complaint involves an extensive differential diagnosis and also carries with it a high risk of complications and morbidity.    The differential diagnosis includes dehydration,  anemia, electrolyte abnormality, arrhythmia, the patient has no chest pain or shortness of breath making ACS unlikely.  She has had no fevers or infectious symptoms making sepsis unlikely, she has no focal neurologic deficits making CVA unlikely  Additional history obtained: Additional history obtained from EMS  Records reviewed previous admission documents  ED Course and Reassessment: Upon patient's arrival she was in rapid A-fib with a heart rate in the 140s.  She reports that she has not been able to eat or drink the last 2 days concerning for dehydration as a cause of her tachycardia.  She states she has not been on her metoprolol for several months.  She will be started with fluid resuscitation and reassess for rate control.  She will additionally have labs to evaluate for severe dehydration.  She will require social work consult as she has been unable to take care of herself at home due to her broken chair.  Independent labs interpretation:  The following labs were independently interpreted: Within normal range  Independent visualization of imaging: N/A  Consultation: - Consulted or discussed management/test interpretation w/ external professional: Social work  Consideration for admission or further workup: Patient has no emergent conditions that require admission at this time.  Social work is evaluating the patient for safe disposition either home with a new chair versus SNF Social Determinants of health: Wheelchair bound    Amount and/or Complexity of Data Reviewed Labs: ordered.  Risk OTC  drugs. Prescription drug management.          Final Clinical Impression(s) / ED Diagnoses Final diagnoses:  None    Rx / DC Orders ED Discharge Orders     None         Phoebe Sharps, DO 01/22/22 1723

## 2022-01-22 NOTE — NC FL2 (Signed)
[place] MEDICAID FL2 LEVEL OF CARE SCREENING TOOL     IDENTIFICATION  Patient Name: Kaitlyn [name] Birthdate: [birth year] Sex: female Admission Date (Current Location): 01/22/2022  [facility] and [place] Number:  Producer, television/film/video and Address:  [facility],  [address] [place], [place] [address]      Provider Number: [id number]  Attending Physician Name and Address:  Phoebe Sharps, DO  Relative Name and Phone Number:       Current Level of Care: Hospital Recommended Level of Care: Skilled Nursing Facility Prior Approval Number:    Date Approved/Denied: 01/22/22 PASRR Number: [id number]  Discharge Plan: SNF    Current Diagnoses: Patient Active Problem List   Diagnosis Date Noted   Bilateral lower leg cellulitis 10/01/2020   Diarrhea 09/12/2020   Infestation by bed bug 09/11/2020   COVID-19 virus infection 06/16/2020   SOB (shortness of breath) 06/16/2020   Medication nonadherence due to psychosocial problem 04/18/2019   Persistent atrial fibrillation (HCC) 04/17/2019   Positive occult stool blood test 01/23/2019   Advance care planning 01/04/2019   Chronic systolic CHF (congestive heart failure) (HCC)    Vitamin B 12 deficiency    Neurocognitive deficits 11/01/2018   Chronic diarrhea 10/16/2018   Thrombocytopenia (HCC) 10/16/2018   CAD (coronary artery disease) 10/16/2018   Acute respiratory failure with hypoxia (HCC) 10/16/2018   Generalized weakness 10/16/2018   Anemia 11/23/2017   NSTEMI (non-ST elevated myocardial infarction) (HCC) 01/09/2017   Ischemic cardiomyopathy 01/09/2017   Obesity, Class III, BMI 40-49.9 (morbid obesity) (HCC) 01/09/2017   Chest pain    Atrial fibrillation with RVR (HCC) 01/07/2017   Anaphylaxis 12/04/2015   Hypotension due to hypovolemia 12/01/2015   Leukocytosis    Type 2 diabetes mellitus with hyperglycemia, with long-term current use of insulin (HCC)    Lactic acidosis 07/07/2015   Dehydration 07/07/2015    Angioedema 07/07/2015   Allergic reaction 07/07/2015   Acute on chronic combined systolic and diastolic heart failure (HCC) 03/24/2015   Atrial fibrillation with rapid ventricular response (HCC)    Diabetes mellitus, without long-term current use of insulin (HCC)    Thyroid nodule    Seizures (HCC)    Hypokalemia    Hypomagnesemia    Lymphadenopathy 09/24/2014   Macrocytosis    Essential hypertension    Psychosocial impairment    Acute on chronic systolic CHF (congestive heart failure) (HCC) 04/27/2014   H/O noncompliance with medical treatment, presenting hazards to health 03/23/2014   Chronic atrial fibrillation 03/23/2014    Orientation RESPIRATION BLADDER Height & Weight     Self, Time, Situation, Place  Normal Continent Weight: 279 lb 15.8 oz (127 kg) Height:  5\' 11"  (180.3 cm)  BEHAVIORAL SYMPTOMS/MOOD NEUROLOGICAL BOWEL NUTRITION STATUS      Continent Diet  AMBULATORY STATUS COMMUNICATION OF NEEDS Skin   Limited Assist Verbally Normal                       Personal Care Assistance Level of Assistance  Bathing, Feeding Bathing Assistance: Limited assistance Feeding assistance: Independent       Functional Limitations Info  Sight, Hearing, Speech Sight Info: Adequate Hearing Info: Adequate Speech Info: Adequate    SPECIAL CARE FACTORS FREQUENCY                       Contractures Contractures Info: Not present    Additional Factors Info  Code Status, Allergies Code Status Info: Prior Allergies  Info: Caffeine High Intolerance Palpitations, Other (See Comments) Seizure from large doses, heart races from small doses  Cranberry High Allergy Shortness Of Breath, Diarrhea, Itching, Other (See Comments) Severe headache.."Ocean Spray cranberry juice".  PATIENT DOES NOT RECALL.  Lisinopril High Allergy Diarrhea, Itching, Swelling Site of swelling not recalled  Penicillins           Current Medications (01/22/2022):  This is the current hospital active  medication list Current Facility-Administered Medications  Medication Dose Route Frequency Provider Last Rate Last Admin   acetaminophen (TYLENOL) tablet 650 mg  650 mg Oral Q4H PRN Phoebe Sharps, DO       Current Outpatient Medications  Medication Sig Dispense Refill   loperamide (IMODIUM) 2 MG capsule Take 4 mg by mouth 2 (two) times daily as needed for diarrhea or loose stools.     metoprolol succinate (TOPROL-XL) 25 MG 24 hr tablet Take 1 tablet (25 mg total) by mouth daily. 30 tablet 1     Discharge Medications: Please see discharge summary for a list of discharge medications.  Relevant Imaging Results:  Relevant Lab Results:   Additional Information    Jannette Spanner Sharnee Douglass, LCSW

## 2022-01-22 NOTE — ED Triage Notes (Addendum)
Pt bib ems from home. APS called out for pt to be brought to hospital and evaluated to potentially be placed at snf. Per ems pt was sitting on recliner covered in urine. Pt HR 140s in triage hx afib states was taken off meds

## 2022-01-22 NOTE — ED Notes (Signed)
I gave patient a sandwich and a cup of soda

## 2022-01-23 NOTE — ED Notes (Signed)
Provided pt with a diet sprite. Pt asked if she could have her tablet shipped to the hospital.

## 2022-01-23 NOTE — ED Notes (Signed)
Pt bp will not register change cuff and placement, nurse aware.

## 2022-01-23 NOTE — Progress Notes (Signed)
Patient does not have any bed offers at this time. ° °Kaitlyn [name], MSW, LCSW °Transitions of Care   Clinical Social Worker II °[phone] ° °

## 2022-01-23 NOTE — ED Notes (Signed)
Breakfast tray given. °

## 2022-01-23 NOTE — ED Notes (Signed)
Pt given diet sprite 

## 2022-01-23 NOTE — ED Notes (Signed)
Use steady to take pt to bathroom.

## 2022-01-24 MED ORDER — LOPERAMIDE HCL 2 MG PO CAPS
2.0000 mg | ORAL_CAPSULE | Freq: Once | ORAL | Status: AC
  Administered 2022-01-24: 2 mg via ORAL
  Filled 2022-01-24: qty 1

## 2022-01-24 NOTE — ED Notes (Signed)
Breakfast tray given. °

## 2022-01-25 MED ORDER — LOPERAMIDE HCL 2 MG PO CAPS
2.0000 mg | ORAL_CAPSULE | Freq: Once | ORAL | Status: AC
  Administered 2022-01-25: 2 mg via ORAL
  Filled 2022-01-25: qty 1

## 2022-01-25 NOTE — ED Provider Notes (Signed)
Patient cleared for DC per SW.   Wynetta Fines, MD 01/25/22 1000

## 2022-01-25 NOTE — Progress Notes (Addendum)
This CSW went to speak with the pt. The pt states "I just want my recliner fixed so I can sit in the living room and watch my TV and DVDs." This CSW explained to pt that the hospital will not replace or have her recliner fixed. The pt responded "Why not? You all spend money on everything else. It's only about $250 bucks." This CSW confirmed that the pt has means to get around her apartment. The pt reported "I have my wheelchair and my walker to get around my apartment." This CSW confirmed that the pt did not want to be evaluated by PT to potentially go to SNF. The pt responded "I just want to go home."   This CSW contacted Zack with [employer] to confirm whether they provided the recliner. Timothy Lasso will give this CSW a call back. If they provided, this CSW will inform that it is broken and inform the pt whether they can fix it. Zack reported that there are no records of [employer] providing the pt with a recliner.   This CSW spoke with Dahlia Client, RN, and Dr. Rodena Medin who both reported that the pt is medically cleared.   Addend @ 1:06 PM This CSW attempted to contact APS to file a report for self neglect. This CSW left a voicemail. TOC following.  Addend @ 2:45 PM APS still has not contacted this CSW back. This CSW will inform 2nd shift CSW, [place] that the pt needs a self neglect report.

## 2022-01-25 NOTE — Progress Notes (Signed)
  PT Cancellation Note  Patient Details Name: Kaitlyn [name] MRN: [id number] DOB: [birth year]   Cancelled Treatment:    Reason Eval/Treat Not Completed: Patient declined, no reason specified, stated that she was not going to try sitting up and asked therapist to stop trying to convince her to participate. Blanchard Kelch PT Acute Rehabilitation Services Office [phone] Weekend pager-[phone]    Rada Hay 01/25/2022, 8:21 AM

## 2022-01-25 NOTE — Progress Notes (Signed)
Pt still does not have any bed offers at this time. TOC following.

## 2022-01-25 NOTE — Discharge Instructions (Signed)
Return for any problem.  ?

## 2022-01-26 MED ORDER — LOPERAMIDE HCL 2 MG PO CAPS
2.0000 mg | ORAL_CAPSULE | Freq: Once | ORAL | Status: AC
  Administered 2022-01-26: 2 mg via ORAL
  Filled 2022-01-26: qty 1

## 2022-01-26 NOTE — Progress Notes (Signed)
PT Cancellation Note  Patient Details Name: Kaitlyn [name] MRN: [id number] DOB: 27-Sep-1950   Cancelled Treatment:    Reason Eval/Treat Not Completed: Patient declined, no reason specified   Rada Hay 01/26/2022, 10:24 AM

## 2022-01-26 NOTE — ED Provider Notes (Signed)
Emergency Medicine Observation Re-evaluation Note  Kaitlyn [name] is a 71 y.o. female, seen on rounds today.  Pt initially presented to the ED for complaints of medical screening Currently, the patient is resting in bed and eating.  Physical Exam  BP 106/66   Pulse 95   Temp 98.2 F (36.8 C)   Resp 18   Ht 1.803 m (5\' 11" )   Wt 127 kg   SpO2 97%   BMI 39.05 kg/m  Physical Exam General: No acute distress   ED Course / MDM  EKG:EKG Interpretation  Date/Time:  Friday January 22 2022 11:30:45 EDT Ventricular Rate:  154 PR Interval:    QRS Duration: 98 QT Interval:  271 QTC Calculation: 434 R Axis:   -15 Text Interpretation: Atrial fibrillation with rapid V-rate Borderline left axis deviation Low voltage, extremity and precordial leads Repolarization abnormality, prob rate related Rapid rate compared to prior EKG, otherwise no significant changes Confirmed by [id number] (Arturo Morton) on 01/22/2022 12:24:20 PM  I have reviewed the labs performed to date as well as medications administered while in observation.  Recent changes in the last 24 hours include discussed with social work and patient has been cleared to go back home..  Plan  Current plan is for to home today.    03/24/2022, MD 01/26/22 [phone]

## 2022-01-26 NOTE — Progress Notes (Addendum)
This CSW followed up with APS to make a self neglect report.   APS report made. APS informed that they do not advise on whether to discharge the pt. This CSW spoke with EDP, Dr. Freida Busman, and the pt will be discharged shortly. [facility] called for transport. TOC signing off.

## 2022-01-26 NOTE — ED Notes (Signed)
[facility] here to transport pt home via stretcher. Pt given dc instructions.  Pt denies questions or concerns upon dc. No belongings left in room upon dc.

## 2022-01-27 ENCOUNTER — Emergency Department (HOSPITAL_COMMUNITY)
Admission: EM | Admit: 2022-01-27 | Discharge: 2022-02-03 | Disposition: A | Discharge status: Home / Self Care | Payer: No Typology Code available for payment source | Attending: Emergency Medicine | Admitting: Emergency Medicine

## 2022-01-27 ENCOUNTER — Encounter (HOSPITAL_COMMUNITY): Payer: Self-pay

## 2022-01-27 ENCOUNTER — Other Ambulatory Visit: Payer: Self-pay

## 2022-01-27 DIAGNOSIS — R5383 Other fatigue: Secondary | ICD-10-CM

## 2022-01-27 DIAGNOSIS — Z7901 Long term (current) use of anticoagulants: Secondary | ICD-10-CM | POA: Insufficient documentation

## 2022-01-27 DIAGNOSIS — R519 Headache, unspecified: Secondary | ICD-10-CM | POA: Diagnosis not present

## 2022-01-27 DIAGNOSIS — Z79899 Other long term (current) drug therapy: Secondary | ICD-10-CM | POA: Insufficient documentation

## 2022-01-27 DIAGNOSIS — Z0000 Encounter for general adult medical examination without abnormal findings: Secondary | ICD-10-CM | POA: Diagnosis not present

## 2022-01-27 DIAGNOSIS — R531 Weakness: Secondary | ICD-10-CM

## 2022-01-27 DIAGNOSIS — R2689 Other abnormalities of gait and mobility: Secondary | ICD-10-CM | POA: Insufficient documentation

## 2022-01-27 MED ORDER — ACETAMINOPHEN 500 MG PO TABS
1000.0000 mg | ORAL_TABLET | Freq: Once | ORAL | Status: AC
  Administered 2022-01-27: 1000 mg via ORAL
  Filled 2022-01-27: qty 2

## 2022-01-27 MED ORDER — METOPROLOL SUCCINATE ER 25 MG PO TB24
25.0000 mg | ORAL_TABLET | Freq: Every day | ORAL | Status: DC
  Administered 2022-01-27 – 2022-02-03 (×8): 25 mg via ORAL
  Filled 2022-01-27 (×7): qty 1

## 2022-01-27 NOTE — ED Provider Notes (Signed)
[facility]-EMERGENCY DEPT Provider Note   CSN: [id number] Arrival date & time: 01/27/22  0705     History  Chief Complaint  Patient presents with   Medical Screening    Kateleen Encarnacion is a 71 y.o. female.  HPI Elderly female arrives from home via EMS with concerns for leg sores, weakness.  Patient has history notable for 3 prior ED visits in the past 6 months including discharge from here 2 days ago after being evaluated for weakness.  She notes that she has a headache similar to that she was experiencing last time, which improved with Tylenol.  She has not taken any Tylenol since discharge.  No new fever, vomiting, fall.  EMS reports the patient was in her recliner, seemingly has not moved since being taken there on discharge.    Home Medications Prior to Admission medications   Medication Sig Start Date End Date Taking? Authorizing Provider  metoprolol succinate (TOPROL-XL) 25 MG 24 hr tablet Take 1 tablet (25 mg total) by mouth daily. Patient not taking: Reported on 01/23/2022 09/16/20   Rhetta Mura, MD  apixaban (ELIQUIS) 5 MG TABS tablet Take 1 tablet (5 mg total) by mouth 2 (two) times daily. Patient not taking: No sig reported 04/19/19 04/30/20  Joseph Art, DO      Allergies    Caffeine, Cranberry, Penicillins, Zestril [lisinopril], Lactose intolerance (gi), and Other    Review of Systems   Review of Systems  All other systems reviewed and are negative.   Physical Exam Updated Vital Signs BP 105/78 (BP Location: Right Arm)   Pulse 88   Temp 97.7 F (36.5 C) (Oral)   Resp 17   Ht 5\' 11"  (1.803 m)   Wt 127 kg   SpO2 95%   BMI 39.05 kg/m  Physical Exam Vitals and nursing note reviewed.  Constitutional:      General: She is not in acute distress.    Appearance: She is well-developed.     Comments: Obese hirsute elderly female wake alert, speaking clearly.  HENT:     Head: Normocephalic and atraumatic.  Eyes:      Conjunctiva/sclera: Conjunctivae normal.  Pulmonary:     Effort: Pulmonary effort is normal. No respiratory distress.     Breath sounds: No stridor.  Abdominal:     General: There is no distension.  Skin:    General: Skin is warm and dry.     Comments: Superficial excoriations, left leg, no confluent erythema  Neurological:     Mental Status: She is alert and oriented to person, place, and time.     Cranial Nerves: No cranial nerve deficit.     Comments: Atrophy, no focal weakness, face is symmetric, speech is clear  Psychiatric:     Comments: Patient has little insight into her condition.     ED Results / Procedures / Treatments   Labs (all labs ordered are listed, but only abnormal results are displayed) Labs Reviewed - No data to display  EKG None  Radiology No results found.  Procedures Procedures    Medications Ordered in ED Medications  acetaminophen (TYLENOL) tablet 1,000 mg (1,000 mg Oral Given 01/27/22 0735)    ED Course/ Medical Decision Making/ A&P This patient with a Hx of multiple medical issues, notably obesity, immobility, generally bedbound status, though she cannot ambulate presents to the ED for concern of weakness, inability to care for herself, this involves an extensive number of treatment options, and is a complaint  that carries with it a high risk of complications and morbidity.    The differential diagnosis includes dehydration, infection, inability to care for self   Social Determinants of Health:  Elderly, living alone,  Additional history obtained:  Additional history and/or information obtained from EMS, chart review, notable for EMS for arrival information, chart review for evaluation by social work including earlier this week with designation as the patient being okay to go home   After the initial evaluation, orders, including: Tylenol, social work consult were initiated.   The patient was also maintained on pulse oximetry. The  readings were typically 99% ra, normal   On repeat evaluation of the patient stayed the same  Consultations Obtained:  I requested consultation with the social work team,  and discussed lab and imaging findings as well as pertinent plan - they recommend: PT eval, consideration of potential for placement  Dispostion / Final MDM:  After consideration of the diagnostic results and the patient's response to treatment, this elderly female presents from home after recent evaluation here, now with similar concerns of difficulty caring for herself, including difficulty with ambulation, self hygiene, and the patient is found to be awake and alert, with soiled clothing, arriving in the hospital gown she left with a few days ago.  Physical exam is generally reassuring, as are vital signs in terms of acute new pathology, case discussed with social work to ascertain options for assistance.  Final Clinical Impression(s) / ED Diagnoses Final diagnoses:  Weakness     Gerhard Munch, MD 01/27/22 [phone]

## 2022-01-27 NOTE — Evaluation (Signed)
Physical Therapy Evaluation Patient Details Name: Kaitlyn [name] MRN: [id number] DOB: March 28, 1951 Today's Date: 01/27/2022  History of Present Illness  71 yo female arrives from home via EMS 9/13   with concerns for leg sores, weakness.  Patient has history notable for 3 prior ED visits in the past 6 months including discharge from here 01/26/22.  No new fever, vomiting, fall.  EMS reports the patient was in her recliner, seemingly has not moved since being taken there on discharge.  Clinical Impression  Patient well known to PT, just DC'd yesterday back to her apartment, unable to care for self.  Patient rambling about previous issues at her home, difficult to get patient to focus on  current needs of getting OOB to be cleaned up. Patient found with incontinence of BM, Patient able to mobilize to sitting on edge with supervision. Stands at Rw  for 2 minutes to be washed up. Patient  returned to supine with min assistance. Nursing notified of  needing further assistance with hygiene.  Pt admitted with above diagnosis.   Pt currently with functional limitations due to the deficits listed below (see PT Problem List). Pt will benefit from skilled PT to increase their independence and safety with mobility to allow discharge to the venue listed below.   Patient will benefit from increased assistance with her care.     Recommendations for follow up therapy are one component of a multi-disciplinary discharge planning process, led by the attending physician.  Recommendations may be updated based on patient status, additional functional criteria and insurance authorization.  Follow Up Recommendations Skilled nursing-short term rehab (<3 hours/day) Can patient physically be transported by private vehicle: No    Assistance Recommended at Discharge    Patient can return home with the following  A little help with walking and/or transfers;A little help with bathing/dressing/bathroom;Assist for  transportation;Help with stairs or ramp for entrance    Equipment Recommendations None recommended by PT  Recommendations for Other Services       Functional Status Assessment Patient has had a recent decline in their functional status and demonstrates the ability to make significant improvements in function in a reasonable and predictable amount of time.     Precautions / Restrictions Precautions Precautions: Fall Precaution Comments: incontinence B/B      Mobility  Bed Mobility   Bed Mobility: Supine to Sit, Sit to Supine     Supine to sit: Supervision, HOB elevated Sit to supine: Min assist   General bed mobility comments: assisted legs back onto bed    Transfers Overall transfer level: Needs assistance Equipment used: Rolling walker (2 wheels) Transfers: Sit to/from Stand Sit to Stand: Min assist, From elevated surface           General transfer comment: stood from stretcher x 2 for hygiene( incontinent of BM in  bed prior)    Ambulation/Gait                  Stairs            Wheelchair Mobility    Modified Rankin (Stroke Patients Only)       Balance   Sitting-balance support: No upper extremity supported, Feet supported Sitting balance-Leahy Scale: Good     Standing balance support: During functional activity, Reliant on assistive device for balance, Bilateral upper extremity supported Standing balance-Leahy Scale: Poor  Pertinent Vitals/Pain Pain Assessment Faces Pain Scale: Hurts whole lot Pain Location: buttocks, excoriated, Pain Descriptors / Indicators: Discomfort, Moaning Pain Intervention(s): Repositioned    Home Living Family/patient expects to be discharged to:: Private residence Living Arrangements: Alone   Type of Home: Apartment Home Access: Level entry       Home Layout: One level Home Equipment: Agricultural consultant (2 wheels);Wheelchair - manual;BSC/3in1      Prior  Function Prior Level of Function : Needs assist             Mobility Comments: lift recliner broken, doesn't ambulate, typically uses lift chair to a rolling office chair to roll around apt, can pivot ADLs Comments: uses a lot of briefs. has grocery delivery, sponge bathing, grossly independent but with poor hygiene     Hand Dominance   Dominant Hand: Left    Extremity/Trunk Assessment   Upper Extremity Assessment Upper Extremity Assessment: Generalized weakness    Lower Extremity Assessment Lower Extremity Assessment: Generalized weakness    Cervical / Trunk Assessment Cervical / Trunk Assessment: Kyphotic  Communication   Communication: No difficulties  Cognition Arousal/Alertness: Awake/alert Behavior During Therapy: WFL for tasks assessed/performed Overall Cognitive Status: Difficult to assess                                 General Comments: rambling about events that led up to last admission,  difficulty getting ppatient to stop talking and participate in her care.        General Comments      Exercises     Assessment/Plan    PT Assessment Patient needs continued PT services  PT Problem List Decreased balance;Decreased strength;Decreased mobility;Decreased activity tolerance;Decreased safety awareness       PT Treatment Interventions DME instruction;Therapeutic activities;Stair training;Functional mobility training;Neuromuscular re-education;Balance training;Therapeutic exercise;Patient/family education;Wheelchair mobility training    PT Goals (Current goals can be found in the Care Plan section)  Acute Rehab PT Goals Patient Stated Goal: go home PT Goal Formulation: With patient Time For Goal Achievement: 02/10/22 Potential to Achieve Goals: Fair    Frequency Min 2X/week     Co-evaluation               AM-PAC PT "6 Clicks" Mobility  Outcome Measure Help needed turning from your back to your side while in a flat bed without  using bedrails?: A Little Help needed moving from lying on your back to sitting on the side of a flat bed without using bedrails?: A Little Help needed moving to and from a bed to a chair (including a wheelchair)?: A Lot Help needed standing up from a chair using your arms (e.g., wheelchair or bedside chair)?: A Lot Help needed to walk in hospital room?: Total Help needed climbing 3-5 steps with a railing? : Total 6 Click Score: 12    End of Session   Activity Tolerance: Patient tolerated treatment well Patient left: in bed;with call bell/phone within reach Nurse Communication: Mobility status PT Visit Diagnosis: Muscle weakness (generalized) (M62.81)    Time: 0277-4128 PT Time Calculation (min) (ACUTE ONLY): 27 min   Charges:   PT Evaluation $PT Eval Low Complexity: 1 Low PT Treatments $Therapeutic Activity: 8-22 mins        Blanchard Kelch PT Acute Rehabilitation Services Office [phone] Weekend pager-[phone]   Rada Hay 01/27/2022, 3:56 PM

## 2022-01-27 NOTE — ED Notes (Signed)
Pt's brief and linens changed  

## 2022-01-27 NOTE — Progress Notes (Signed)
Contacted EDP, Dr. Jeraldine Loots, to put in order for PT. TOC following.

## 2022-01-27 NOTE — Progress Notes (Signed)
CSW spoke to [place] [phone] she mentioned that the Pt has medicaid however she was not able to give me the number as she did not have it. TOC will have to continue  to try to find this number.

## 2022-01-27 NOTE — ED Triage Notes (Signed)
Pt states she can't take care of herself at home anymore and would like to be placed in a facility.

## 2022-01-28 MED ORDER — POLYETHYLENE GLYCOL 3350 17 G PO PACK
17.0000 g | PACK | Freq: Every day | ORAL | Status: DC
  Administered 2022-01-29 – 2022-01-30 (×2): 17 g via ORAL
  Filled 2022-01-28 (×4): qty 1

## 2022-01-28 MED ORDER — ACETAMINOPHEN 325 MG PO TABS
650.0000 mg | ORAL_TABLET | Freq: Four times a day (QID) | ORAL | Status: DC | PRN
  Administered 2022-01-28 – 2022-02-03 (×12): 650 mg via ORAL
  Filled 2022-01-28 (×13): qty 2

## 2022-01-28 MED ORDER — SENNOSIDES-DOCUSATE SODIUM 8.6-50 MG PO TABS
1.0000 | ORAL_TABLET | Freq: Every evening | ORAL | Status: DC | PRN
  Filled 2022-01-28: qty 1

## 2022-01-28 NOTE — Progress Notes (Addendum)
This CSW spoke with Chastiny at DSS. Chastiny stated she will call to see if she can find out the pt's Medicaid #. TOC following.   Pt will be faxed out to facilities. Bed offers pending.   Addend @ 1:15 PM This CSW presented bed offer to the pt and the pt accepted the offer at [facility]. Debbie in Admissions is reviewing acceptance at this time.

## 2022-01-28 NOTE — ED Notes (Signed)
Pt appears to be resting, sitting up on stretcher, observe even RR and unlabored, NAD noted, side rails up x2 for safety, TOC ongoing, call light within reach, no further concerns as of present.

## 2022-01-28 NOTE — Progress Notes (Signed)
Pt will be DC tomorrow to [facility]. TOC will update with Bed Information

## 2022-01-28 NOTE — Progress Notes (Signed)
CSW spoke to Pt, the Pt is in agreement  to go to any facility she is accepted in as long as she can get the care she needs.

## 2022-01-28 NOTE — ED Notes (Signed)
Pt given snacks and sandwich tray along with something to drink per request.

## 2022-01-28 NOTE — ED Provider Notes (Signed)
Emergency Medicine Observation Re-evaluation Note  Kaitlyn [name] is a 71 y.o. female, seen on rounds today.  Pt initially presented to the ED for complaints of generalized weakness, leg sores. Concerns for difficulty caring for her self, failure to thrive.  Currently, the patient is sitting up in bed. C/o the bed hurting her backside and would like a more comfortable bed if possible. Also states it has been a few days since a BM. Denies abd pain/N/V.  Physical Exam  BP 128/73   Pulse 83   Temp 97.7 F (36.5 C) (Oral)   Resp 18   Ht 5\' 11"  (1.803 m)   Wt 127 kg   SpO2 95%   BMI 39.05 kg/m  Physical Exam General: Sitting up in bed Cardiac: well-perfused Lungs: no increased WOB Psych: calm, cooperative  ED Course / MDM  EKG:   I have reviewed the labs performed to date as well as medications administered while in observation.  Recent changes in the last 24 hours include evaluated by PT, recommended acute rehab PT min 2x/week. Added miralax qd and senna qhs PRN for patient's constipation. Also added tylenol PRN for mild pain.  Plan  Current plan is for social work working on options for assistance.    , MD 01/28/22 [phone]

## 2022-01-28 NOTE — NC FL2 (Addendum)
[place] MEDICAID FL2 LEVEL OF CARE SCREENING TOOL     IDENTIFICATION  Patient Name: Kaitlyn [name] Birthdate: [birth year] Sex: female Admission Date (Current Location): 01/27/2022  [facility] and [place] Number:  Producer, television/film/video and Address:         Provider Number: [id number]  Attending Physician Name and Address:  Default, Provider, MD  Relative Name and Phone Number:       Current Level of Care: Hospital Recommended Level of Care: Skilled Nursing Facility Prior Approval Number:    Date Approved/Denied:   PASRR Number: [id number]  Discharge Plan: SNF    Current Diagnoses: Patient Active Problem List   Diagnosis Date Noted   Bilateral lower leg cellulitis 10/01/2020   Diarrhea 09/12/2020   Infestation by bed bug 09/11/2020   COVID-19 virus infection 06/16/2020   SOB (shortness of breath) 06/16/2020   Medication nonadherence due to psychosocial problem 04/18/2019   Persistent atrial fibrillation (HCC) 04/17/2019   Positive occult stool blood test 01/23/2019   Advance care planning 01/04/2019   Chronic systolic CHF (congestive heart failure) (HCC)    Vitamin B 12 deficiency    Neurocognitive deficits 11/01/2018   Chronic diarrhea 10/16/2018   Thrombocytopenia (HCC) 10/16/2018   CAD (coronary artery disease) 10/16/2018   Acute respiratory failure with hypoxia (HCC) 10/16/2018   Generalized weakness 10/16/2018   Anemia 11/23/2017   NSTEMI (non-ST elevated myocardial infarction) (HCC) 01/09/2017   Ischemic cardiomyopathy 01/09/2017   Obesity, Class III, BMI 40-49.9 (morbid obesity) (HCC) 01/09/2017   Chest pain    Atrial fibrillation with RVR (HCC) 01/07/2017   Anaphylaxis 12/04/2015   Hypotension due to hypovolemia 12/01/2015   Leukocytosis    Type 2 diabetes mellitus with hyperglycemia, with long-term current use of insulin (HCC)    Lactic acidosis 07/07/2015   Dehydration 07/07/2015   Angioedema 07/07/2015   Allergic reaction 07/07/2015   Acute on  chronic combined systolic and diastolic heart failure (HCC) 03/24/2015   Atrial fibrillation with rapid ventricular response (HCC)    Diabetes mellitus, without long-term current use of insulin (HCC)    Thyroid nodule    Seizures (HCC)    Hypokalemia    Hypomagnesemia    Lymphadenopathy 09/24/2014   Macrocytosis    Essential hypertension    Psychosocial impairment    Acute on chronic systolic CHF (congestive heart failure) (HCC) 04/27/2014   H/O noncompliance with medical treatment, presenting hazards to health 03/23/2014   Chronic atrial fibrillation 03/23/2014    Orientation RESPIRATION BLADDER Height & Weight     Self, Time, Place, Situation  Normal Continent Weight: 279 lb 15.8 oz (127 kg) Height:  5\' 11"  (180.3 cm)  BEHAVIORAL SYMPTOMS/MOOD NEUROLOGICAL BOWEL NUTRITION STATUS      Continent Diet (regular)  AMBULATORY STATUS COMMUNICATION OF NEEDS Skin   Total Care Verbally Normal                       Personal Care Assistance Level of Assistance  Bathing, Feeding, Dressing Bathing Assistance: Limited assistance Feeding assistance: Independent Dressing Assistance: Limited assistance     Functional Limitations Info  Sight, Hearing, Speech Sight Info: Adequate Hearing Info: Adequate Speech Info: Adequate    SPECIAL CARE FACTORS FREQUENCY  PT (By licensed PT), OT (By licensed OT)     PT Frequency: x5/week OT Frequency: x5/week            Contractures Contractures Info: Not present    Additional Factors Info  Code Status, Allergies, Psychotropic  Code Status Info: Full Allergies Info: Caffeine   Cranberry   Penicillins   Zestril (Lisinopril)   Lactose Intolerance (Gi)   Other Psychotropic Info: None         Current Medications (01/28/2022):  This is the current hospital active medication list Current Facility-Administered Medications  Medication Dose Route Frequency Provider Last Rate Last Admin   acetaminophen (TYLENOL) tablet 650 mg  650 mg Oral  Q6H PRN Loetta Rough, MD   650 mg at 01/28/22 0949   metoprolol succinate (TOPROL-XL) 24 hr tablet 25 mg  25 mg Oral Daily Gerhard Munch, MD   25 mg at 01/28/22 0949   polyethylene glycol (MIRALAX / GLYCOLAX) packet 17 g  17 g Oral Daily Loetta Rough, MD       senna-docusate (Senokot-S) tablet 1 tablet  1 tablet Oral QHS PRN Loetta Rough, MD       Current Outpatient Medications  Medication Sig Dispense Refill   loperamide (IMODIUM A-D) 2 MG tablet Take 2 mg by mouth 4 (four) times daily as needed for diarrhea or loose stools.     metoprolol succinate (TOPROL-XL) 25 MG 24 hr tablet Take 1 tablet (25 mg total) by mouth daily. (Patient not taking: Reported on 01/23/2022) 30 tablet 1     Discharge Medications: Please see discharge summary for a list of discharge medications.  Relevant Imaging Results:  Relevant Lab Results:   Additional Information SSN:[id number]  Princella Ion, LCSW

## 2022-01-28 NOTE — ED Notes (Signed)
Pt resting and playing on her personal cell phone, NAD noted, observed even RR and unlabored, side rails up x2 for safety, plan of care ongoing, pt expresses no needs or concerns at this time, call light within reach, no further concerns as of present.

## 2022-01-29 NOTE — Progress Notes (Addendum)
This CSW attempted to contact Chastiny [phone]) to inquire about Medicaid # to provide to facility.   Medicaid #: [id number] S

## 2022-01-29 NOTE — Progress Notes (Addendum)
[place], Admissions rep for [facility], called and stated "I am unable to take this pt. We've had her before and she would not let us change her, care for her, provide hygiene care, nothing. She contacted the state on [place] and she did not leave on good terms. I apologize but my social worker just brought it to my attention." PTAR has been canceled and TOC will continue to search for placement.   EDP, Dr. Charm Barges, and Ronalee Red, RN, notified via secure chat.

## 2022-01-29 NOTE — Progress Notes (Signed)
CSW sent Pt FL2 back out with the medicaid in hope to get bed offers for the Pt. TOC will continue to follow.

## 2022-01-29 NOTE — Progress Notes (Signed)
PT Cancellation Note  Patient Details Name: Kaitlyn [name] MRN: [id number] DOB: Jul 11, 1950   Cancelled Treatment:    Reason Eval/Treat Not Completed: (P) Patient declined, no reason specified. Pt refused mobility, asked PT to return at a later time. Will follow up as schedule and pt status allows, which may be another day.  [name], PT, DPT [facility] Department Office: [phone] [name] 01/29/2022, 2:37 PM

## 2022-01-29 NOTE — Progress Notes (Addendum)
Room 141 Bed B and report #: [phone]  G[place]ada from [employer] asked if the pt could be sent later in the afternoon due to a power outage that is taking place in the city. TOC following.   Addend @ 11:48 AM [facility] is ready to accept the pt. EDP, Dr. Charm Barges and Ronalee Red, RN, notified. PTAR called for transport.

## 2022-01-29 NOTE — ED Notes (Signed)
C/o sore bottom from sitting and requested tylenol. Continues to refuse repositioning.

## 2022-01-29 NOTE — Progress Notes (Addendum)
Rodilyn from [facility] called to report that the Auth was approved for [facility]. This CSW informed her that the pt will not be going to that facility and confirmed that a new Auth will have to be in place for a different facility.   Rodilyn: [phone] Ext [phone]

## 2022-01-29 NOTE — ED Provider Notes (Addendum)
Emergency Medicine Observation Re-evaluation Note  Kaitlyn [name] is a 71 y.o. female, seen on rounds today.  Pt initially presented to the ED for complaints of Medical Screening Currently, the patient is resting quietly.  Physical Exam  BP (!) 132/93   Pulse 93   Temp 98 F (36.7 C) (Oral)   Resp 18   Ht 5\' 11"  (1.803 m)   Wt 127 kg   SpO2 97%   BMI 39.05 kg/m  Physical Exam General: No acute distress Cardiac: Well-perfused Lungs: Nonlabored Psych: Cooperative  ED Course / MDM  EKG:   I have reviewed the labs performed to date as well as medications administered while in observation.  Recent changes in the last 24 hours include social work evaluation.  Plan  Current plan is for placement.  11:45 AM.  Was informed by social work that patient has been accepted to rehab.  Ambulance has been called.  1:10 PM.  Now informed by social work that patient not to be discharged at this time.  , MD 01/29/22 1146    01/31/22, MD 01/29/22 [phone]

## 2022-01-29 NOTE — ED Notes (Signed)
Pt resting in stretcher. Drink refilled

## 2022-01-30 MED ORDER — NYSTATIN 100000 UNIT/GM EX CREA
TOPICAL_CREAM | Freq: Two times a day (BID) | CUTANEOUS | Status: DC
  Filled 2022-01-30: qty 30

## 2022-01-30 NOTE — ED Provider Notes (Signed)
Emergency Medicine Observation Re-evaluation Note  Kaitlyn [name] is a 70 y.o. female, seen on rounds today.  Pt initially presented to the ED for complaints of Medical Screening Currently, the patient is awake and alert.  Pt is angry because we are not letting her sit in 3 diapers at once.  Pt urinated all over the bed this am instead of ringing for help.  She is awaiting placement for SNF.  She refused PT yesterday.  Physical Exam  BP 122/73   Pulse (!) 102   Temp 98.2 F (36.8 C) (Oral)   Resp 17   Ht 5\' 11"  (1.803 m)   Wt 127 kg   SpO2 100%   BMI 39.05 kg/m  Physical Exam General: awake and alert Cardiac: rr Lungs: clear Psych: agitated at the nurse  ED Course / MDM  EKG:   I have reviewed the labs performed to date as well as medications administered while in observation.  Recent changes in the last 24 hours include refusal of PT.  PT has urinated on her self.  Plan  Current plan is for awaiting SNF.    Isla Pence, MD 01/30/22 [phone]

## 2022-01-30 NOTE — ED Notes (Signed)
Walked into room, patient was sitting in bed, linens and gown were soiled with urine. Patient states she does not like to use the purewick and does not call out when she uses the bathroom. Patient state she prefers to wear 3 diapers to void. Patient has reddened area on the buttocks, possible stage 2 pressure injury. Patient states the area is sore and itches. Patient also has reddened area under her breast and lower abdomen. Patient was educated on using the call bell when she soils her diaper in order to prevent further skin breakdown. Call bell is within reach. Patient has on non-slip socks.

## 2022-01-31 NOTE — ED Notes (Signed)
Pt linens soiled. New bed linens placed. Peri care performed, barrier cream applied and new purewik in place. Pt educated about calling out when she urinates on herself.

## 2022-01-31 NOTE — ED Notes (Signed)
Pt was cleaned up and changed. Pt was able to stand up and use bed side commode so RN and Tech could change the sheets. Purewick was placed.

## 2022-01-31 NOTE — ED Notes (Signed)
Pt provided with graham crackers

## 2022-01-31 NOTE — ED Notes (Signed)
Attempted to give pt her morning medications, pt refused at this time as she is eating breakfast and does not wish to be interrupted.

## 2022-01-31 NOTE — ED Notes (Signed)
Assumed care of patient.

## 2022-01-31 NOTE — ED Provider Notes (Signed)
Emergency Medicine Observation Re-evaluation Note  Kaitlyn [name] is a 71 y.o. female, seen on rounds today.  Pt initially presented to the ED for complaints of Medical Screening Currently, the patient is sleeping.  Physical Exam  BP 135/85 (BP Location: Left Arm)   Pulse 65   Temp 98.1 F (36.7 C) (Oral)   Resp 18   Ht 5\' 11"  (1.803 m)   Wt 127 kg   SpO2 96%   BMI 39.05 kg/m  Physical Exam General: No acute distress Cardiac: Well-perfused Lungs: Nonlabored Psych: Cooperative  ED Course / MDM  EKG:   I have reviewed the labs performed to date as well as medications administered while in observation.  Recent changes in the last 24 hours include no significant changes.  Plan  Current plan is for placement SNF    Hayden Rasmussen, MD 01/31/22 1747

## 2022-01-31 NOTE — ED Notes (Signed)
Patient is in bed. Called out for assistance with purewick. Reassured patient the device is working properly. No other needs at this time.

## 2022-02-01 MED ORDER — LOPERAMIDE HCL 2 MG PO CAPS
2.0000 mg | ORAL_CAPSULE | Freq: Once | ORAL | Status: AC
  Administered 2022-02-01: 2 mg via ORAL
  Filled 2022-02-01: qty 1

## 2022-02-01 NOTE — Progress Notes (Signed)
CSW spoke to the Pt about her check being signed over to the offer facility, The Pt is aware and has agreed that it is okay and she understands.

## 2022-02-01 NOTE — ED Notes (Signed)
Patient was given something to eat and drink. No other needs at this time.

## 2022-02-01 NOTE — ED Notes (Signed)
Patient was cleaned and pads were changed. Patient declined bath at this time.

## 2022-02-01 NOTE — Progress Notes (Signed)
This CSW is communicating with [facility] to see if there is a long term bed available. It was reported by Ebony Hail, admissions rep, that Medicaid has a different DOB on file. This CSW verified the pt's DOB with the pt and inquired about a physical ID. The pt reported she does not have an ID with her and that it expired due to her not renewing when she moved from [place] to [place].

## 2022-02-01 NOTE — ED Notes (Signed)
Patient called out for assistance. Patient given something to drink. No other needs at this time.

## 2022-02-01 NOTE — ED Provider Notes (Signed)
Emergency Medicine Observation Re-evaluation Note  Brenee Gajda is a 71 y.o. female, seen on rounds today.  Pt initially presented to the ED for complaints of Medical Screening Currently, the patient is sleeping.  Physical Exam  BP 124/74 (BP Location: Left Wrist)   Pulse 86   Temp 97.6 F (36.4 C) (Oral)   Resp 18   Ht 5\' 11"  (1.803 m)   Wt 127 kg   SpO2 99%   BMI 39.05 kg/m  Physical Exam General: No acute distress Cardiac: Well-perfused Lungs: even and unlabored Psych: no agitation  ED Course / MDM  EKG:   I have reviewed the labs performed to date as well as medications administered while in observation.  Recent changes in the last 24 hours include no significant changes.  Plan  Current plan is for placement SNF        Regan Lemming, MD 02/01/22 [phone]

## 2022-02-01 NOTE — ED Notes (Signed)
Pt req bathroom assistance. Pt was informed that staff would help, but she was fully capable of cleaning herself. Offered assistance to bedside commode which pt declined. Pt then became agitated when this writer attempted to assist with cleaning. Pt is capable of cleaning herself, but often refuses and demands staff clean for her. Charge nurse made aware

## 2022-02-01 NOTE — Progress Notes (Signed)
Physical Therapy Discharge Patient Details Name: Jenayah Antu MRN: [id number] DOB: [birth year] Today's Date: 02/01/2022 Time:  -     Patient discharged from PT services secondary to max benefit of PT, Patient also has refused PT multiple times. Please see latest therapy progress note for current level of functioning and progress toward goals.   GP    [place] Office [phone] Weekend pager-[phone]  Claretha Cooper 02/01/2022, 12:47 PM

## 2022-02-02 NOTE — Progress Notes (Addendum)
This CSW went to speak with the pt to make a call to Medicare to request a switch back to traditional Medicare. This CSW explained to the pt that there is a facility interested in accepting her and that we need to make this change in order to proceed. Pt appeared to have difficulty staying awake during encounter. TOC will attempt to see pt at a later time (when pt is awake and alert) to contact Medicare to make this change.   This CSW contacted Sonia Side, APS caseworker to provide an update on process for placement. Sonia Side is aware and has also made his supervisor aware. This CSW has also informed TOC supervisor, Beverlee Nims. TOC following.   Medicare help #: [phone]

## 2022-02-02 NOTE — ED Provider Notes (Signed)
Emergency Medicine Observation Re-evaluation Note  Kaitlyn [name] is a 71 y.o. female, seen on rounds today.  Pt initially presented to the ED for complaints of Medical Screening Currently, the patient is sleeping.  Physical Exam  BP 133/87   Pulse 91   Temp 98 F (36.7 C) (Oral)   Resp 18   Ht 5\' 11"  (1.803 m)   Wt 127 kg   SpO2 96%   BMI 39.05 kg/m  Physical Exam General: No acute distress Cardiac: Well-perfused Lungs: even and unlabored Psych: no agitation  ED Course / MDM  EKG:   I have reviewed the labs performed to date as well as medications administered while in observation.  Recent changes in the last 24 hours include no significant changes.  Plan  Current plan is for placement SNF            Regan Lemming, MD 02/02/22 [id number]

## 2022-02-02 NOTE — Progress Notes (Addendum)
TOC supervisor spoke with Kaitlyn [name] with APS with his supervisor present for the conversation and reiterated request to have a meeting with patient with all parties involved to discuss placement recommendations, barriers, and challenges patient has experienced living independently in the community in order to reach a consensus on placement going forward, as patient appears to be uncertain of her plans going forward.  Kaitlyn [name] with APS has agreed to be present for this conversation, which is scheduled for tomorrow 02/03/22 at 10 am along with TOC CSW.

## 2022-02-03 MED ORDER — METOPROLOL SUCCINATE ER 25 MG PO TB24: 25.0000 mg | ORAL_TABLET | Freq: Every day | ORAL | 2 refills | Status: AC

## 2022-02-03 MED ORDER — NYSTATIN 100000 UNIT/GM EX CREA: TOPICAL_CREAM | CUTANEOUS | 2 refills | Status: AC

## 2022-02-03 NOTE — Progress Notes (Addendum)
This CSW informed Janie, Admissions rep, at [facility] that the pt has completed the Disenrollment form for [facility] -  and it has been faxed. This CSW has faxed a copy to [place] [facility] per her request. Narda Rutherford informed this CSW that the pt can come to the facility today.   This CSW has reached out to Dr. Tyrone Nine to put the pt up for d/c so the facility can view AVS. This CSW will let [facility] know that the facility is ready to accept her and provide the RN with report # and room assignment.   Room #: 710B Report #: [phone] Corey Harold has been called.

## 2022-02-03 NOTE — ED Notes (Signed)
Pt requested tylenol for sore bottom. Refused turning/repositioning.

## 2022-02-03 NOTE — ED Provider Notes (Signed)
Emergency Medicine Observation Re-evaluation Note  Kaitlyn [name] is a 71 y.o. female, seen on rounds today.  Pt initially presented to the ED for complaints of Medical Screening Currently, the patient is eating breakfast.  Physical Exam  BP 133/87   Pulse 91   Temp 98.1 F (36.7 C) (Oral)   Resp 18   Ht 5\' 11"  (1.803 m)   Wt 127 kg   SpO2 96%   BMI 39.05 kg/m  Physical Exam General: No acute distress Cardiac: Well-perfused Lungs: even and unlabored Psych: no agitation  ED Course / MDM  EKG:   I have reviewed the labs performed to date as well as medications administered while in observation.  Recent changes in the last 24 hours include no significant changes.  Plan  Current plan is for placement SNF              Deno Etienne, DO 02/03/22 1610

## 2022-02-03 NOTE — ED Notes (Signed)
Pt's bed is visibly soiled w/ urine. Pt requesting warm blanket. Encouraged pt to let this writer change the sheets and provide peri care however pt refused stating, "We can do it in a couple hours, I just need a warm sheet."

## 2022-02-03 NOTE — ED Notes (Signed)
Pt given lunch tray.

## 2022-02-03 NOTE — Progress Notes (Signed)
This CSW went to speak with the pt, along with APS caseworker, Sonia Side, and [facility] supervisor Beverlee Nims to discuss her plan of care and progess on placement. The pt stated she still is wanting to be placed into a facility and informed "I'll do whatever to make the process easier."   This CSW assisted the pt in [place] [employer] services to request that her [place] is switched back to traditional Medicare parts A and B. Mardene Speak was able to assist [place] and informed this Probation officer and the pt that the pt is to complete a disenrollment form and it is to be faxed back to [facility]. This CSW has printed off the form and will take it to the pt to fill out and fax it back.   Mardene Speak is the representative who assisted [place]. Call reference #: [id number].  This CSW has informed [facility] of this process. TOC following.

## 2022-03-08 ENCOUNTER — Ambulatory Visit: Payer: Medicare Other | Admitting: Podiatrist

## 2022-05-17 DEATH — deceased
